# Patient Record
Sex: Female | Born: 1935 | Race: White | Hispanic: No | Marital: Married | Smoking: Never smoker
Health system: Southern US, Community
[De-identification: ages and names within clinical notes are randomized; demographics above are authoritative.]

## PROBLEM LIST (undated history)

## (undated) DIAGNOSIS — I10 Essential (primary) hypertension: Secondary | ICD-10-CM

## (undated) DIAGNOSIS — I701 Atherosclerosis of renal artery: Secondary | ICD-10-CM

## (undated) DIAGNOSIS — I714 Abdominal aortic aneurysm, without rupture, unspecified: Secondary | ICD-10-CM

## (undated) DIAGNOSIS — E785 Hyperlipidemia, unspecified: Secondary | ICD-10-CM

## (undated) DIAGNOSIS — I739 Peripheral vascular disease, unspecified: Secondary | ICD-10-CM

## (undated) DIAGNOSIS — I5022 Chronic systolic (congestive) heart failure: Secondary | ICD-10-CM

## (undated) DIAGNOSIS — I519 Heart disease, unspecified: Secondary | ICD-10-CM

## (undated) DIAGNOSIS — I2510 Atherosclerotic heart disease of native coronary artery without angina pectoris: Secondary | ICD-10-CM

## (undated) DIAGNOSIS — I4891 Unspecified atrial fibrillation: Secondary | ICD-10-CM

## (undated) HISTORY — DX: Atherosclerosis of renal artery: I70.1

## (undated) HISTORY — DX: Essential (primary) hypertension: I10

## (undated) HISTORY — DX: Abdominal aortic aneurysm, without rupture, unspecified: I71.40

## (undated) HISTORY — DX: Peripheral vascular disease, unspecified: I73.9

## (undated) HISTORY — DX: Atherosclerotic heart disease of native coronary artery without angina pectoris: I25.10

## (undated) HISTORY — PX: ANGIOPLASTY: SHX39

## (undated) HISTORY — PX: OTHER SURGICAL HISTORY: SHX169

## (undated) HISTORY — PX: CORONARY STENT PLACEMENT: SHX1402

## (undated) HISTORY — DX: Hyperlipidemia, unspecified: E78.5

## (undated) HISTORY — DX: Heart disease, unspecified: I51.9

## (undated) HISTORY — DX: Abdominal aortic aneurysm, without rupture: I71.4

---

## 1998-02-23 ENCOUNTER — Other Ambulatory Visit: Admission: RE | Admit: 1998-02-23 | Discharge: 1998-02-23 | Payer: Self-pay | Admitting: Family Medicine

## 2001-05-19 ENCOUNTER — Encounter: Payer: Self-pay | Admitting: Internal Medicine

## 2001-05-19 ENCOUNTER — Encounter: Admission: RE | Admit: 2001-05-19 | Discharge: 2001-05-19 | Payer: Self-pay | Admitting: Internal Medicine

## 2002-06-02 ENCOUNTER — Encounter: Payer: Self-pay | Admitting: Ophthalmology

## 2002-06-02 ENCOUNTER — Ambulatory Visit (HOSPITAL_COMMUNITY): Admission: RE | Admit: 2002-06-02 | Discharge: 2002-06-03 | Payer: Self-pay | Admitting: Ophthalmology

## 2005-10-28 DIAGNOSIS — I2510 Atherosclerotic heart disease of native coronary artery without angina pectoris: Secondary | ICD-10-CM

## 2005-10-28 HISTORY — DX: Atherosclerotic heart disease of native coronary artery without angina pectoris: I25.10

## 2006-08-13 ENCOUNTER — Encounter: Admission: RE | Admit: 2006-08-13 | Discharge: 2006-08-13 | Payer: Self-pay | Admitting: Internal Medicine

## 2006-10-09 ENCOUNTER — Inpatient Hospital Stay (HOSPITAL_COMMUNITY): Admission: RE | Admit: 2006-10-09 | Discharge: 2006-10-10 | Payer: Self-pay | Admitting: Interventional Cardiology

## 2006-10-23 ENCOUNTER — Ambulatory Visit (HOSPITAL_COMMUNITY): Admission: RE | Admit: 2006-10-23 | Discharge: 2006-10-23 | Payer: Self-pay | Admitting: Interventional Cardiology

## 2009-04-20 ENCOUNTER — Inpatient Hospital Stay (HOSPITAL_COMMUNITY): Admission: AD | Admit: 2009-04-20 | Discharge: 2009-04-22 | Payer: Self-pay | Admitting: Cardiology

## 2011-02-04 LAB — CBC
HCT: 39 % (ref 36.0–46.0)
HCT: 40.5 % (ref 36.0–46.0)
Hemoglobin: 13.3 g/dL (ref 12.0–15.0)
Hemoglobin: 14 g/dL (ref 12.0–15.0)
MCHC: 34.1 g/dL (ref 30.0–36.0)
MCHC: 34.6 g/dL (ref 30.0–36.0)
MCV: 87.2 fL (ref 78.0–100.0)
MCV: 87.5 fL (ref 78.0–100.0)
Platelets: 224 10*3/uL (ref 150–400)
Platelets: 265 10*3/uL (ref 150–400)
RBC: 4.46 MIL/uL (ref 3.87–5.11)
RBC: 4.65 MIL/uL (ref 3.87–5.11)
RDW: 13.1 % (ref 11.5–15.5)
RDW: 13.6 % (ref 11.5–15.5)
WBC: 5.6 10*3/uL (ref 4.0–10.5)
WBC: 7.2 10*3/uL (ref 4.0–10.5)

## 2011-02-04 LAB — COMPREHENSIVE METABOLIC PANEL
ALT: 27 U/L (ref 0–35)
AST: 60 U/L — ABNORMAL HIGH (ref 0–37)
Albumin: 4.1 g/dL (ref 3.5–5.2)
Alkaline Phosphatase: 101 U/L (ref 39–117)
BUN: 11 mg/dL (ref 6–23)
CO2: 30 mEq/L (ref 19–32)
Calcium: 9.4 mg/dL (ref 8.4–10.5)
Chloride: 101 mEq/L (ref 96–112)
Creatinine, Ser: 0.52 mg/dL (ref 0.4–1.2)
GFR calc Af Amer: >60 mL/min (ref >60)
GFR calc non Af Amer: >60 mL/min (ref >60)
Glucose, Bld: 115 mg/dL — ABNORMAL HIGH (ref 70–99)
Potassium: 3.6 mEq/L (ref 3.5–5.1)
Sodium: 141 mEq/L (ref 135–145)
Total Bilirubin: 0.9 mg/dL (ref 0.3–1.2)
Total Protein: 6.8 g/dL (ref 6.0–8.3)

## 2011-02-04 LAB — CARDIAC PANEL(CRET KIN+CKTOT+MB+TROPI)
CK, MB: 1 ng/mL (ref 0.3–4.0)
CK, MB: 1 ng/mL (ref 0.3–4.0)
CK, MB: 1.4 ng/mL (ref 0.3–4.0)
Relative Index: INVALID (ref 0.0–2.5)
Relative Index: INVALID (ref 0.0–2.5)
Relative Index: INVALID (ref 0.0–2.5)
Total CK: 31 U/L (ref 7–177)
Total CK: 39 U/L (ref 7–177)
Total CK: 42 U/L (ref 7–177)
Troponin I: 0.01 ng/mL (ref 0.00–0.06)
Troponin I: 0.02 ng/mL (ref 0.00–0.06)
Troponin I: 0.02 ng/mL (ref 0.00–0.06)

## 2011-02-04 LAB — BASIC METABOLIC PANEL
BUN: 9 mg/dL (ref 6–23)
CO2: 29 mEq/L (ref 19–32)
Calcium: 9.1 mg/dL (ref 8.4–10.5)
Chloride: 104 mEq/L (ref 96–112)
Creatinine, Ser: 0.56 mg/dL (ref 0.4–1.2)
GFR calc Af Amer: >60 mL/min (ref >60)
GFR calc non Af Amer: >60 mL/min (ref >60)
Glucose, Bld: 96 mg/dL (ref 70–99)
Potassium: 3.3 mEq/L — ABNORMAL LOW (ref 3.5–5.1)
Sodium: 140 mEq/L (ref 135–145)

## 2011-02-04 LAB — DIFFERENTIAL
Basophils Absolute: 0 10*3/uL (ref 0.0–0.1)
Basophils Relative: 0 % (ref 0–1)
Eosinophils Absolute: 0 10*3/uL (ref 0.0–0.7)
Eosinophils Relative: 0 % (ref 0–5)
Lymphocytes Relative: 12 % (ref 12–46)
Lymphs Abs: 0.9 10*3/uL (ref 0.7–4.0)
Monocytes Absolute: 0.4 10*3/uL (ref 0.1–1.0)
Monocytes Relative: 5 % (ref 3–12)
Neutro Abs: 5.9 10*3/uL (ref 1.7–7.7)
Neutrophils Relative %: 83 % — ABNORMAL HIGH (ref 43–77)

## 2011-02-04 LAB — LIPID PANEL
Cholesterol: 328 mg/dL — ABNORMAL HIGH (ref 0–200)
HDL: 47 mg/dL (ref >39)
LDL Cholesterol: 225 mg/dL — ABNORMAL HIGH (ref 0–99)
Total CHOL/HDL Ratio: 7 RATIO
Triglycerides: 281 mg/dL — ABNORMAL HIGH (ref <150)
VLDL: 56 mg/dL — ABNORMAL HIGH (ref 0–40)

## 2011-02-04 LAB — PROTIME-INR
INR: 1 (ref 0.00–1.49)
Prothrombin Time: 13.2 seconds (ref 11.6–15.2)

## 2011-02-04 LAB — BRAIN NATRIURETIC PEPTIDE: Pro B Natriuretic peptide (BNP): 55 pg/mL (ref 0.0–100.0)

## 2011-02-04 LAB — TSH: TSH: 0.32 u[IU]/mL — ABNORMAL LOW (ref 0.350–4.500)

## 2011-03-12 NOTE — Cardiovascular Report (Signed)
NAMECUMA, KOPITZKE NO.:  [id number]   MEDICAL RECORD NO.:  [id number]          PATIENT TYPE:  INP   LOCATION:  2506                         FACILITY:  [facility]   PHYSICIAN:  Jettie Booze, MDDATE OF BIRTH:  12/28/1925   DATE OF PROCEDURE:  04/21/2009  DATE OF DISCHARGE:  04/22/2009                            CARDIAC CATHETERIZATION   REFERRING PHYSICIAN:  Delanna Ahmadi, MD   PROCEDURES PERFORMED:  1. Left heart catheterization.  2. Left ventriculogram.  3. Coronary angiogram.  4. Intracoronary vascular ultrasound of the left anterior descending.  5. Percutaneous coronary intervention of the left circumflex.   OPERATOR:  Jettie Booze, MD   INDICATIONS:  Unstable angina.   PROCEDURE NARRATIVE:  The risks and benefits of cardiac catheterization  were explained to the patient, and informed consent was obtained.  The  patient was brought to the cath lab.  She was prepped and draped in the  usual sterile fashion.  Her right groin was infiltrated with 1%  lidocaine.  A 6-French sheath was placed into the right femoral artery  using the modified Seldinger technique.  Left coronary artery  angiography was performed using a JL4 catheter.  The catheter was  advanced to the vessel ostium under fluoroscopic guidance.  Digital  angiography was performed in multiple projections using hand injection  of contrast.  The right coronary artery angiography was performed using  a JR4.0 catheter.  The catheter was advanced to the vessel ostium under  fluoroscopic guidance.  Digital angiography was performed in a similar  fashion.  A pigtail catheter was advanced to the ascending aorta and  across the aortic valve under fluoroscopic guidance.  A power injection  of contrast was performed in the RAO projection to image the left  ventricle.  The catheter was pulled back under continuous hemodynamic  pressure monitoring.  The IVUS of the LAD was then performed using a  CLS  3.5 guiding catheter.  The catheter was advanced to the vessel ostium  under fluoroscopic guidance.  Prowater wire was placed down the LAD.  IVUS was performed.  Subsequently, the PCI of the circumflex was  performed.  See below for details.  The sheath was removed using manual  compression after the PCI was performed.   FINDINGS:  The left main coronary artery was widely patent.  The left  circumflex was a large vessel.  There is a 70% eccentric lesion noted in  the mid circumflex at the origin of the first obtuse marginal.  The  remainder of the circumflex was a large vessel with mild irregularities.  The OM1 was also a large vessel with mild luminal irregularities.  The  left anterior descending was a large vessel with moderate  atherosclerosis in the midportion and mild irregularities in the  remainder of the vessel.  The first and second diagonals were small,  vessels were widely patent, third diagonal was a medium-sized vessel and  widely patent.  The right coronary artery was a large dominant vessel  with mild-to-moderate atherosclerosis diffusely.  The posterolateral  artery was a large vessel.  The  PDA was large vessel with mild luminal  irregularities.  There was some ectasia noted in the distal right  coronary artery.   The left ventriculogram shows moderately decreased left ventricular  function with an estimated ejection fraction 30-35%.  The IVUS of the  LAD was then performed.  There was no portion of the LAD that had a  cross-sectional area less than 4 sq mm.  The stenosis was approximately  50% in cross-sectional area.  The Prowater wire was subsequently  advanced to the circumflex.  A 2.5 x 12 Endeavor stent was placed across  the lesion and deployed at 12 atmospheres for 25 seconds.  The stent was  then postdilated with a 3.0 x 9 balloon, inflated to 14 atmospheres for  25 seconds.  There was an approximately 10% residual stenosis.  TIMI 3  flow was maintained  throughout.  The patient tolerated the procedure  well.   IMPRESSION:  1. Moderate left anterior descending disease, which did not turn out      to be significant by intracoronary vascular ultrasound.  2. 70% mid circumflex lesion, which was successfully treated with a      drug-eluting stent, dilated to greater than 3 mm in diameter.  This      was a 3.0 x 12 mm Endeavor stent.  3. Moderately to severely reduced left ventricular function with an      estimated ejection fraction of 30-35%.  4. Normal hemodynamics with an left ventricular end diastolic pressure      of less than 10.  No aortic valve gradient.  Aortic pressure 134/65      with a mean aortic pressure of 92, left ventricular pressure of      135/0 with an left ventricular end diastolic pressure of 7.   RECOMMENDATIONS:  Continue aspirin and Plavix indefinitely.  The patient  had already been on Plavix because of her prior intervention.  Of note,  the site of the diagonal intervention looked patent.  Continue  aggressive secondary prevention.  We will also increase carvedilol and  will need to follow up on her left ventricular ejection fraction.      Jettie Booze, MD  Electronically Signed     Jettie Booze, MD  Electronically Signed    JSV/MEDQ  D:  05/23/2009  T:  05/24/2009  Job:  [id number]

## 2011-03-15 NOTE — Discharge Summary (Signed)
NAME:  Jacqueline Reilly, Jacqueline Reilly NO.:  [id number]   MEDICAL RECORD NO.:  [id number]          PATIENT TYPE:  INP   LOCATION:  6526                         FACILITY:  [facility]   PHYSICIAN:  Joesphine Bare, P.A.       DATE OF BIRTH:  12/28/1925   DATE OF ADMISSION:  10/09/2006  DATE OF DISCHARGE:                               DISCHARGE SUMMARY   DISCHARGE DIAGNOSES:  1. Coronary artery disease, status post cutting plane angioplasty of      the ostium of the second diagonal.  2. Peripheral vascular disease with discovery of left artery stenosis      for later intervention.  3. Severe left ventricular dysfunction, ischemic.  4. Hyperlipidemia, treated.  5. Hypertension, treated.  6. Family history of coronary artery disease.  7. Long-term medication use.   HISTORY:  Ms. Banger is a 77 year old female who in October began having  dyspnea on exertion.  A 2-D echo showed severe LV systolic dysfunction.  She was aggressively treated with Lasix and lisinopril.  Her symptoms  improved; however, she was felt to need to undergo cardiac  catheterization for definitive diagnosis/treatment.   She was admitted on October 09, 2006, and underwent left heart  catheterization under the care of Dr. Casandra Doffing.  She was found to  have a second diagonal with a 70% ostial stenosis.  Other vessels had  moderate irregularities but no significant disease.  She was also found  to have severe LV systolic dysfunction at around 20% to 25%.  An  abdominal aortogram showed a small infrarenal AAA.  There is an ostial  left renal artery stenosis of 90%.   The patient underwent cutting plane angioplasty to the second diagonal  reducing the 70% ostial lesion to a 0% post procedure.  The patient is  to remain on aspirin and Plavix.  She is to follow up in the office in 1  week, and at that time arrangements will be made for an intervention to  the left renal artery.   DISCHARGE MEDICATIONS:  1. Lipitor  daily.  2. Lasix daily.  3. Potassium 20 mEq a day.  4. Lisinopril 10 mg a day.  5. Enteric-coated aspirin 325 mg a day.  6. Plavix 75 mg a day.  7. Maxzide daily.  8. Tylenol p.r.n.  9. Fioricet p.r.n.  10.Coreg 3.125 mg b.i.d.  11.Sublingual nitroglycerin p.r.n. chest pain.   DISCHARGE INSTRUCTIONS:  She is to remain on a low-fat diet.  No driving  for 2 days.  No lifting over 10 pounds for 1 week.  Clean area gently  with soap and water; no scrubbing.  Call for any recurrent chest pain or  problems.  Follow up with Dr. Irish Lack on October 17, 2006, at 11:45  a.m.      Joesphine Bare, P.A.     LB/MEDQ  D:  10/10/2006  T:  10/10/2006  Job:  [id number]   cc:   Delanna Ahmadi, M.D.  Jettie Booze, MD

## 2011-03-15 NOTE — Cardiovascular Report (Signed)
NAME:  Jacqueline Reilly, Jacqueline Reilly NO.:  [id number]   MEDICAL RECORD NO.:  [id number]          PATIENT TYPE:  OIB   LOCATION:  2899                         FACILITY:  [facility]   PHYSICIAN:  Jettie Booze, MDDATE OF BIRTH:  12/28/1925   DATE OF PROCEDURE:  10/09/2006  DATE OF DISCHARGE:                            CARDIAC CATHETERIZATION   PROCEDURES PERFORMED:  1. Left heart catheterization.  2. Left ventriculogram.  3. Abdominal aortogram.  4. Coronary angiogram.  5. PCI of the second diagonal.   OPERATOR:  Dr. Irish Lack.   INDICATIONS:  Congestive heart failure and shortness of breath.   PROCEDURE AND NARRATIVE:  The risks and benefits of cardiac  catheterization were explained to the patient and informed consent was  obtained.  The patient was brought to the Catheterization Lab and placed  on the table.  She was prepped and draped in the usual sterile fashion.  Her right groin was infiltrated with 1% lidocaine.  A 6-French arterial  sheath was placed into a right femoral artery using the modified  Seldinger technique.  Left coronary artery angiography was performed  using a JL4.0 catheter.  The catheter was advanced to the vessel ostium  under fluoroscopic guidance.  Digital angiography was performed in  multiple projections using hand injection of contrast.  Right coronary  artery angiography was then performed using a JR4.0 catheter.  The  catheter was advanced to the vessel ostium under fluoroscopic guidance.  Digital angiography was performed using multiple projections and hand  injection of contrast.  The left heart catheterization was then  performed using a pigtail catheter.  The catheter was advanced across  the aortic valve under fluoroscopic guidance.  A power injection of  contrast was done in the third-degree RAO position and visualized the  left ventricle.  The catheter was pulled back under continuous  hemodynamic pressure monitoring.  The catheter  was then withdrawn to the  level of the abdominal aorta.  A power injection of contrast was done in  the AP projection to visualize the renal arteries.  The PCI of the  diagonal was then performed.  Please see below for details.  The sheath  will be removed using manual compression.  Angiomax was used for  anticoagulation for the intervention.   FINDINGS:  The left main is widely patent.  The left circumflex is a  large vessel with a 40-50% mid vessel stenosis just before the large OM-  2.  The OM-1 is a small vessel.  The OM-2 is a large vessel with mild  irregularities.  The LAD is a large vessel with moderate atherosclerosis  throughout.  There is a 50-60% distal stenosis.  The first diagonal is a  small vessel.  The second diagonal is a moderate-sized vessel with an  ostial 70% stenosis.  The right coronary artery is a large dominant  vessel with mild atherosclerosis.  Left ventriculogram showed severe  left ventricular systolic dysfunction.  The estimated ejection fraction  is 20-25%.  The abdominal aortogram revealed a small infrarenal  abdominal aortic aneurysm.  There is dual arterial supply to  the right  kidney.  There is an ostial left renal artery stenosis of 90%.   HEMODYNAMICS:  Left ventricular pressure 125/0 with an LVEDP of 3 mmHg.  Aortic pressure of 122/60 with a mean aortic pressure of 85 mmHg.  PCI  narrative:  ACLS 3.5 guiding catheter was advanced to the ostium of the  left main.  A Prowater wire was placed down the second diagonal.  A 2.0  x 6 mm cutting balloon was inflated to 6 atmospheres for 45 seconds.  The cutting balloon was subsequently inflated to 6 atmospheres for 30  seconds.  There was an excellent angiographic result.  There was TIMI-  III flow.  There was no residual stenosis.   IMPRESSION:  1. Severe left ventricular dysfunction with an estimated ejection      fraction of 20-25%.  2. Moderate coronary atherosclerosis in the left coronary system.   3. Successful cutting balloon angioplasty of the D2 ostium.  There is      no residual stenosis with TIMI-III flow.   RECOMMENDATIONS:  Continue aspirin and Plavix for now.  We will watch  the patient overnight.  I will plan on PTA/stent of the left renal  artery at a later time once she has recovered from this procedure.  Continue ACE inhibitor for treatment of her systolic dysfunction.  We  will hold diuretics now because her left ventricular end-diastolic  pressure was so low during the catheterization.  She will also need a  beta blocker at some point.      Jettie Booze, MD  Electronically Signed     JSV/MEDQ  D:  10/09/2006  T:  10/09/2006  Job:  [id number]

## 2011-03-15 NOTE — Op Note (Signed)
NAME:  Jacqueline, Reilly NO.:  [id number]   MEDICAL RECORD NO.:  [id number]          PATIENT TYPE:  AMB   LOCATION:  SDS                          FACILITY:  [facility]   PHYSICIAN:  Jettie Booze, MDDATE OF BIRTH:  12/28/1925   DATE OF PROCEDURE:  10/23/2006  DATE OF DISCHARGE:  10/23/2006                               OPERATIVE REPORT   PROCEDURES PERFORMED:  Abdominal aortogram, bilateral selective renal  artery angiogram, hemodynamic pressure assessment of the left ostia  renal artery stenosis.   OPERATOR:  Jettie Booze, M.D.   INDICATIONS:  Renal artery stenosis by prior angiogram, hypertension and  pulmonary edema in a patient who has newly diagnosed congestive heart  failure.   PROCEDURE:  The risks and benefits of peripheral angiography and  intervention were explained to the patient and informed consent was  obtained. The patient was brought to the [facility] lab. She was placed on the  table.  She was prepped and draped in the usual sterile fashion.  Her  left groin was infiltrated with 1% lidocaine.  A 6-French arterial  sheath was placed into the left femoral artery using modified Seldinger  technique. A pigtail catheter was advanced through the abdominal aorta  and a power injection of contrast was performed using digital  subtraction angiography. The renal arteries were visualized. A 5-French  JR-4.0 catheter was then advanced the abdominal aorta and used to  intubate the artery to the upper pole of the of the right kidney.  The  catheter was then rotated and used to engage the ostium of the left  renal artery. There was question about the hemodynamic significance of  the ostial lesion.  A stabilizer wire was introduced into the left renal  artery through the JR-4 catheter.  The catheter was exchanged for a 4-  French end-hole catheter. The end-hole catheter was advanced to the  stenotic area and a pullback was performed under continuous  hemodynamic  pressure monitoring. The endhole catherter could not not be advanced  fully across the stenosis.  Repeat selective angiography was then  performed to ensure there is no trauma to the vessel. The sheath will be  removed using manual pressure.   FINDINGS:  The abdominal aorta shows a dual arterial supply to the right  kidney.  There is single arterial supply to the left kidney.  There is a  long aneurysm involving the infrarenal aorta. The selective angiography  of the right renal artery shows a 20% ostial stenosis of the artery  supplying the upper pole. This is significantly larger artery than the  artery supplying the lower pole.  The selective angiogram of the left  renal artery shows an ostial 20% lesion. There was no gradient by  pullback.   IMPRESSION:  1. No significant renal artery stenosis.  Mild left renal artery      stenosis.  2. Small infrarenal abdominal aortic aneurysm.   RECOMMENDATIONS:  The patient should continue with aggressive preventive  therapy and blood pressure control. Continue her ACE inhibitor for  congestive heart failure. I will follow up with  the patient in the  office.      Jettie Booze, MD  Electronically Signed     JSV/MEDQ  D:  10/23/2006  T:  10/23/2006  Job:  [id number]

## 2011-03-15 NOTE — Discharge Summary (Signed)
NAMESHATORI, LIBERA NO.:  [id number]   MEDICAL RECORD NO.:  [id number]          PATIENT TYPE:  INP   LOCATION:  2506                         FACILITY:  [facility]   PHYSICIAN:  Jettie Booze, MDDATE OF BIRTH:  12/28/1925   DATE OF ADMISSION:  04/20/2009  DATE OF DISCHARGE:  04/22/2009                               DISCHARGE SUMMARY   DISCHARGE DIAGNOSES:  1. Coronary artery disease.  2. Nonischemic cardiomyopathy.  3. Unstable angina.   PROCEDURE PERFORMED:  Cardiac catheterization with drug-eluting stent  placement to the left circumflex.   HOSPITAL COURSE:  The patient was seen in our office and admitted  directly to the hospital.  She ruled out for MI by enzymes, but had some  ECG changes and chest discomfort.  She underwent cardiac catheterization  showing a moderate stenosis in the LAD and a 75% stenosis in the left  circumflex.  The left circumflex stenosis was successfully stented with  a drug-eluting stent.  Her symptoms resolved.  Of note, she did have  some short episodes of nonsustained ventricular tachycardia while she  was on telemetry.  Her Coreg dose was increased.  She remained  asymptomatic.   DISCHARGE MEDICATIONS:  1. Coreg 6.25 mg b.i.d.  2. Plavix 75 mg daily.  3. Lisinopril 10 mg a day.  4. Lasix 40 mg daily.  5. Aspirin 325 mg daily.  6. Sublingual nitroglycerin p.r.n.   DISCHARGE DIET:  Low-sodium, heart-healthy diet.   ACTIVITY:  Increase activity slowly.   FOLLOWUP APPOINTMENTS:  With Dr. Irish Lack on May 02, 2009, at 11:30 in  the morning.      Jettie Booze, MD  Electronically Signed     Jettie Booze, MD  Electronically Signed   JSV/MEDQ  D:  06/07/2009  T:  06/07/2009  Job:  [id number]

## 2011-03-15 NOTE — Op Note (Signed)
NAME:  Jacqueline Reilly, Jacqueline Reilly NO.:  [id number]   MEDICAL RECORD NO.:  [id number]                   PATIENT TYPE:  OIB   LOCATION:  2550                                 FACILITY:  [facility]   PHYSICIAN:  Clent Demark. Rankin, M.D.                DATE OF BIRTH:  12/28/1925   DATE OF PROCEDURE:  06/02/2002  DATE OF DISCHARGE:                                 OPERATIVE REPORT   PREOPERATIVE DIAGNOSIS:  Rhegmatogenous detachment of the right eye--macula  on.   POSTOPERATIVE DIAGNOSIS:  Rhegmatogenous detachment of the right eye--macula  on.   PROCEDURE:  Scleral buckle retinal cryopexy of the right eye to repair  retinal detachment.   SURGEON:  Clent Demark. Rankin, M.D.   ANESTHESIA:  General endotracheal anesthesia.   INDICATIONS FOR PROCEDURE:  The patient is a 77 year old woman with profound  visual loss, nasal vision, as well as superior vision in the right eye on  the basis of spontaneous rhegmatogenous detachment.  This was an attempt to  reattach the retina via scleral buckle of the right eye.  The patient  understands the risks of anesthesia including recurrence of retinal  detachment, loss of vision, and progressive disease despite intervention.  After appropriate signed consent was obtained, the patient was taken to the  operating room.  She understood the risks of anesthesia including recurrent  retinal detachment of the eye.   PROCEDURE IN DETAIL:  She was taken to the operating room where general  endotracheal anesthesia as introduced without difficulty. The right  periocular region was sterilely prepped and draped in the usual ophthalmic  fashion.  A 360 degree relaxing incision was made superonasally and  inferotemporally.  The rectus muscle was isolated on 2-0 silk ties.  Retinal  cryopexy was applied, and a small retinal break was found at the 1 o'clock  position.  The retinal detachment was found to extend from 11 o'clock  nasally through the 3 o'clock  and over to the 7 o'clock position inferiorly.  Suspicious areas along the ora serrata in the nasal quadrants were treated  with cryopexy.  At this time, a 2 inch x 7 cm sickle knife was selected and  placed under the superior, medial, and anterior rectus muscles and a 240  band used to encircle the globe with 70 Watzke sleeve in the superotemporal  quadrant.  The buckle was tied temporarily with 5-0 [place]il[place] mattress  sutures, two in each of the nasal quadrants and one in each in the remaining  quadrants.  External drainage of subretinal fluid was carried out in the  closed technique using a 26-gauge needle on a 3 cc syringe under direct  observation within the bed of the buckle inferonasally.  All fluid was  removed in this fashion.  Constant pressure on the eye was maintained using  the rectus sutures.  At this time, the buckle was then  tightened permanently  in each of the quadrants.  Ophthalmoscopy confirmed there was no subretinal  fluid.  Excellent seal on the patient was applied.  The retina was attached.  The ends of the band were trimmed in the superotemporal quadrant.  Appropriate tension was applied.  Intraocular pressure was assessed and  found to be adequate, less than 25, and the optic nerve was perfused.  At  this time, the eye was irrigated with bug juice and conjunctiva and tenon's  were brought forward and closed in layers with 7-0 Vicryl suture.  Subconjunctival injections with antibiotics were applied.  A silk patch and  Fox shield applied.  The patient tolerated the procedure well without  complications and taken to the recovery room in good, stable condition.                                              Clent Demark Rankin, M.D.   GAR/MEDQ  D:  06/02/2002  T:  06/06/2002  Job:  [id number]

## 2011-12-14 DIAGNOSIS — R040 Epistaxis: Secondary | ICD-10-CM | POA: Diagnosis not present

## 2012-02-07 DIAGNOSIS — IMO0002 Reserved for concepts with insufficient information to code with codable children: Secondary | ICD-10-CM | POA: Diagnosis not present

## 2012-02-25 ENCOUNTER — Encounter: Payer: Self-pay | Admitting: Internal Medicine

## 2012-04-08 ENCOUNTER — Encounter: Payer: Self-pay | Admitting: *Deleted

## 2012-05-01 ENCOUNTER — Ambulatory Visit (INDEPENDENT_AMBULATORY_CARE_PROVIDER_SITE_OTHER): Payer: Medicare Other | Admitting: Internal Medicine

## 2012-05-01 ENCOUNTER — Encounter: Payer: Self-pay | Admitting: Internal Medicine

## 2012-05-01 VITALS — BP 130/84 | HR 72 | Ht 60.0 in | Wt 113.2 lb

## 2012-05-01 DIAGNOSIS — K5900 Constipation, unspecified: Secondary | ICD-10-CM | POA: Diagnosis not present

## 2012-05-01 NOTE — Progress Notes (Signed)
Jacqueline Reilly [birth year] MRN [id number]    History of Present Illness:  This is an 78 year old white female with constipation. She is embarrassed to talk about it. Her husband doesn't know that she is here. She has had difficulty in evacuating stool. She denies rectal bleeding or abdominal pain. She has never had a colonoscopy. There is no family history of colon cancer. She has taken over-the-counter Senokot 1 tablet with good results. She is very active physically. Her weight has been stable. She has been on a high fiber diet, whole wheat toast in the morning, salads at noontime and vegetables at night.   Past Medical History  Diagnosis Date  . Coronary artery disease   . PVD (peripheral vascular disease)   . Ventricular dysfunction     left; ischemic  . Hypertension   . Hyperlipidemia   . AAA (abdominal aortic aneurysm)   . Renal artery stenosis    Past Surgical History  Procedure Date  . Retinal cryopexy     right eye (for retinal detachment)  . Angioplasty     reports that she has never smoked. She has never used smokeless tobacco. She reports that she does not drink alcohol or use illicit drugs. family history includes Heart disease in her brother, father, and mother. Not on File      Review of Systems: Negative for dysphagia, odynophagia, shortness of breath or chest pain  The remainder of the 10 point ROS is negative except as outlined in H&P   Physical Exam: General appearance  Well developed, in no distress. Eyes- non icteric. HEENT nontraumatic, normocephalic. Mouth no lesions, tongue papillated, no cheilosis. Neck supple without adenopathy, thyroid not enlarged, no carotid bruits, no JVD. Lungs Clear to auscultation bilaterally. Cor normal S1, normal S2, regular rhythm, no murmur,  quiet precordium. Abdomen: Soft relaxed abdomen with normal active bowel sounds. No bruit. No palpable mass. No tenderness. Liver edge at costal margin. Rectal: Normal perianal  area. Normal rectal sphincter tone. Small amount of soft Hemoccult negative stool in the rectal ampulla. There is no prolapse. Extremities no pedal edema. Skin no lesions. Neurological alert and oriented x 3. Psychological normal mood and affect.  Assessment and Plan:  Problem #1 Functional constipation in an elderly woman who is 78 years old and has never had a colonoscopy. At her age, she is not a  candidate for a screening colonoscopy. She is Hemoccult-negative. We have discussed a high-fiber diet as well as over-the-counter fiber supplements which she apparently took in the past and didn't like. I have given her a high fiber diet booklet and pamplet on constipation. She will try to remain physically active and she may take Senokot 2 tablets 3 times a week on a regular basis. As an alternative, I have suggested milk of magnesia, magnesium oxide or Colace. She will check with [place] in 6 months.   05/01/2012 Delfin Edis

## 2012-05-01 NOTE — Patient Instructions (Addendum)
Constipation in Adults Constipation is having fewer than 2 bowel movements per week. Usually, the stools are hard. As we grow older, constipation is more common. If you try to fix constipation with laxatives, the problem may get worse. This is because laxatives taken over a long period of time make the colon muscles weaker. A low-fiber diet, not taking in enough fluids, and taking some medicines may make these problems worse. MEDICATIONS THAT MAY CAUSE CONSTIPATION  Water pills (diuretics).   Calcium channel blockers (used to control blood pressure and for the heart).   Certain pain medicines (narcotics).   Anticholinergics.   Anti-inflammatory agents.   Antacids that contain aluminum.  DISEASES THAT CONTRIBUTE TO CONSTIPATION  Diabetes.   Parkinson's disease.   Dementia.   Stroke.   Depression.   Illnesses that cause problems with salt and water metabolism.  HOME CARE INSTRUCTIONS   Constipation is usually best cared for without medicines. Increasing dietary fiber and eating more fruits and vegetables is the best way to manage constipation.   Slowly increase fiber intake to 25 to 38 grams per day. Whole grains, fruits, vegetables, and legumes are good sources of fiber. A dietitian can further help you incorporate high-fiber foods into your diet.   Drink enough water and fluids to keep your urine clear or pale yellow.   A fiber supplement may be added to your diet if you cannot get enough fiber from foods.   Increasing your activities also helps improve regularity.   Suppositories, as suggested by your caregiver, will also help. If you are using antacids, such as aluminum or calcium containing products, it will be helpful to switch to products containing magnesium if your caregiver says it is okay.   If you have been given a liquid injection (enema) today, this is only a temporary measure. It should not be relied on for treatment of longstanding (chronic) constipation.    Stronger measures, such as magnesium sulfate, should be avoided if possible. This may cause uncontrollable diarrhea. Using magnesium sulfate may not allow you time to make it to the bathroom.  SEEK IMMEDIATE MEDICAL CARE IF:   There is bright red blood in the stool.   The constipation stays for more than 4 days.   There is belly (abdominal) or rectal pain.   You do not seem to be getting better.   You have any questions or concerns.  MAKE SURE YOU:   Understand these instructions.   Will watch your condition.   Will get help right away if you are not doing well or get worse.  Document Released: 07/12/2004 Document Revised: 10/03/2011 Document Reviewed: 09/17/2011 [employer] Patient Information 2012 [employer], [place].  --------------------------------------------------------------------------------  High Fiber Diet A high fiber diet changes your normal diet to include more whole grains, legumes, fruits, and vegetables. Changes in the diet involve replacing refined carbohydrates with unrefined foods. The calorie level of the diet is essentially unchanged. The Dietary Reference Intake (recommended amount) for adult males is 38 g per day. For adult females, it is 25 g per day. Pregnant and lactating women should consume 28 g of fiber per day. Fiber is the intact part of a plant that is not broken down during digestion. Functional fiber is fiber that has been isolated from the plant to provide a beneficial effect in the body. PURPOSE  Increase stool bulk.   Ease and regulate bowel movements.   Lower cholesterol.  INDICATIONS THAT YOU NEED MORE FIBER  Constipation and hemorrhoids.   Uncomplicated  diverticulosis (intestine condition) and irritable bowel syndrome.   Weight management.   As a protective measure against hardening of the arteries (atherosclerosis), diabetes, and cancer.  NOTE OF CAUTION If you have a digestive or bowel problem, ask your caregiver for advice before  adding high fiber foods to your diet. Some of the following medical problems are such that a high fiber diet should not be used without consulting your caregiver:  Acute diverticulitis (intestine infection).   Partial small bowel obstructions.   Complicated diverticular disease involving bleeding, rupture (perforation), or abscess (boil, furuncle).   Presence of autonomic neuropathy (nerve damage) or gastric paresis (stomach cannot empty itself).  GUIDELINES FOR INCREASING FIBER  Start adding fiber to the diet slowly. A gradual increase of about 5 more grams (2 slices of whole-wheat bread, 2 servings of most fruits or vegetables, or 1 bowl of high fiber cereal) per day is best. Too rapid an increase in fiber may result in constipation, flatulence, and bloating.   Drink enough water and fluids to keep your urine clear or pale yellow. Water, juice, or caffeine-free drinks are recommended. Not drinking enough fluid may cause constipation.   Eat a variety of high fiber foods rather than one type of fiber.   Try to increase your intake of fiber through using high fiber foods rather than fiber pills or supplements that contain small amounts of fiber.   The goal is to change the types of food eaten. Do not supplement your present diet with high fiber foods, but replace foods in your present diet.  INCLUDE A VARIETY OF FIBER SOURCES  Replace refined and processed grains with whole grains, canned fruits with fresh fruits, and incorporate other fiber sources. White rice, white breads, and most bakery goods contain little or no fiber.   Brown whole-grain rice, buckwheat oats, and many fruits and vegetables are all good sources of fiber. These include: broccoli, Brussels sprouts, cabbage, cauliflower, beets, sweet potatoes, white potatoes (skin on), carrots, tomatoes, eggplant, squash, berries, fresh fruits, and dried fruits.   Cereals appear to be the richest source of fiber. Cereal fiber is found in  whole grains and bran. Bran is the fiber-rich outer coat of cereal grain, which is largely removed in refining. In whole-grain cereals, the bran remains. In breakfast cereals, the largest amount of fiber is found in those with "bran" in their names. The fiber content is sometimes indicated on the label.   You may need to include additional fruits and vegetables each day.   In baking, for 1 cup white flour, you may use the following substitutions:   1 cup whole-wheat flour minus 2 tbs.    cup white flour plus  cup whole-wheat flour.  Document Released: 10/14/2005 Document Revised: 10/03/2011 Document Reviewed: 08/22/2009 [facility] Patient Information 2012 [place], [place].  Dr Moshe Salisbury griffin

## 2012-05-02 ENCOUNTER — Encounter: Payer: Self-pay | Admitting: Internal Medicine

## 2012-07-22 ENCOUNTER — Emergency Department (HOSPITAL_COMMUNITY): Payer: Medicare Other

## 2012-07-22 ENCOUNTER — Inpatient Hospital Stay (HOSPITAL_COMMUNITY)
Admission: EM | Admit: 2012-07-22 | Discharge: 2012-07-24 | DRG: 087 | Disposition: A | Discharge status: Home / Self Care | Payer: Medicare Other | Attending: Neurosurgery | Admitting: Neurosurgery

## 2012-07-22 ENCOUNTER — Encounter (HOSPITAL_COMMUNITY): Payer: Self-pay | Admitting: Adult Health

## 2012-07-22 ENCOUNTER — Inpatient Hospital Stay (HOSPITAL_COMMUNITY): Payer: Medicare Other

## 2012-07-22 DIAGNOSIS — I701 Atherosclerosis of renal artery: Secondary | ICD-10-CM | POA: Diagnosis not present

## 2012-07-22 DIAGNOSIS — I739 Peripheral vascular disease, unspecified: Secondary | ICD-10-CM | POA: Diagnosis present

## 2012-07-22 DIAGNOSIS — I629 Nontraumatic intracranial hemorrhage, unspecified: Secondary | ICD-10-CM | POA: Diagnosis not present

## 2012-07-22 DIAGNOSIS — I671 Cerebral aneurysm, nonruptured: Secondary | ICD-10-CM | POA: Diagnosis not present

## 2012-07-22 DIAGNOSIS — M542 Cervicalgia: Secondary | ICD-10-CM | POA: Diagnosis not present

## 2012-07-22 DIAGNOSIS — I2510 Atherosclerotic heart disease of native coronary artery without angina pectoris: Secondary | ICD-10-CM | POA: Diagnosis not present

## 2012-07-22 DIAGNOSIS — S1093XA Contusion of unspecified part of neck, initial encounter: Secondary | ICD-10-CM | POA: Diagnosis not present

## 2012-07-22 DIAGNOSIS — S066XAA Traumatic subarachnoid hemorrhage with loss of consciousness status unknown, initial encounter: Secondary | ICD-10-CM | POA: Diagnosis not present

## 2012-07-22 DIAGNOSIS — S066X0A Traumatic subarachnoid hemorrhage without loss of consciousness, initial encounter: Principal | ICD-10-CM | POA: Diagnosis present

## 2012-07-22 DIAGNOSIS — E785 Hyperlipidemia, unspecified: Secondary | ICD-10-CM | POA: Diagnosis present

## 2012-07-22 DIAGNOSIS — R51 Headache: Secondary | ICD-10-CM | POA: Diagnosis not present

## 2012-07-22 DIAGNOSIS — I714 Abdominal aortic aneurysm, without rupture, unspecified: Secondary | ICD-10-CM | POA: Diagnosis present

## 2012-07-22 DIAGNOSIS — Z9861 Coronary angioplasty status: Secondary | ICD-10-CM

## 2012-07-22 DIAGNOSIS — S0990XA Unspecified injury of head, initial encounter: Secondary | ICD-10-CM | POA: Diagnosis not present

## 2012-07-22 DIAGNOSIS — S066X9A Traumatic subarachnoid hemorrhage with loss of consciousness of unspecified duration, initial encounter: Secondary | ICD-10-CM | POA: Diagnosis not present

## 2012-07-22 DIAGNOSIS — S0003XA Contusion of scalp, initial encounter: Secondary | ICD-10-CM | POA: Diagnosis not present

## 2012-07-22 DIAGNOSIS — Z951 Presence of aortocoronary bypass graft: Secondary | ICD-10-CM | POA: Diagnosis not present

## 2012-07-22 DIAGNOSIS — Y92009 Unspecified place in unspecified non-institutional (private) residence as the place of occurrence of the external cause: Secondary | ICD-10-CM

## 2012-07-22 DIAGNOSIS — M25519 Pain in unspecified shoulder: Secondary | ICD-10-CM | POA: Diagnosis not present

## 2012-07-22 DIAGNOSIS — W108XXA Fall (on) (from) other stairs and steps, initial encounter: Secondary | ICD-10-CM | POA: Diagnosis present

## 2012-07-22 DIAGNOSIS — I10 Essential (primary) hypertension: Secondary | ICD-10-CM | POA: Diagnosis not present

## 2012-07-22 DIAGNOSIS — R110 Nausea: Secondary | ICD-10-CM | POA: Diagnosis not present

## 2012-07-22 DIAGNOSIS — I609 Nontraumatic subarachnoid hemorrhage, unspecified: Secondary | ICD-10-CM

## 2012-07-22 LAB — MRSA PCR SCREENING: MRSA by PCR: NEGATIVE

## 2012-07-22 LAB — BASIC METABOLIC PANEL
BUN: 10 mg/dL (ref 6–23)
CO2: 25 mEq/L (ref 19–32)
Calcium: 10.2 mg/dL (ref 8.4–10.5)
Chloride: 97 mEq/L (ref 96–112)
Creatinine, Ser: 0.48 mg/dL — ABNORMAL LOW (ref 0.50–1.10)
GFR calc Af Amer: >90 mL/min (ref >90)
GFR calc non Af Amer: 86 mL/min — ABNORMAL LOW (ref >90)
Glucose, Bld: 140 mg/dL — ABNORMAL HIGH (ref 70–99)
Potassium: 4.8 mEq/L (ref 3.5–5.1)
Sodium: 137 mEq/L (ref 135–145)

## 2012-07-22 LAB — CBC WITH DIFFERENTIAL/PLATELET
Basophils Absolute: 0 10*3/uL (ref 0.0–0.1)
Basophils Relative: 0 % (ref 0–1)
Eosinophils Absolute: 0 10*3/uL (ref 0.0–0.7)
Eosinophils Relative: 0 % (ref 0–5)
HCT: 45 % (ref 36.0–46.0)
Hemoglobin: 15.5 g/dL — ABNORMAL HIGH (ref 12.0–15.0)
Lymphocytes Relative: 7 % — ABNORMAL LOW (ref 12–46)
Lymphs Abs: 1 10*3/uL (ref 0.7–4.0)
MCH: 30.4 pg (ref 26.0–34.0)
MCHC: 34.4 g/dL (ref 30.0–36.0)
MCV: 88.2 fL (ref 78.0–100.0)
Monocytes Absolute: 0.5 10*3/uL (ref 0.1–1.0)
Monocytes Relative: 4 % (ref 3–12)
Neutro Abs: 13.3 10*3/uL — ABNORMAL HIGH (ref 1.7–7.7)
Neutrophils Relative %: 90 % — ABNORMAL HIGH (ref 43–77)
Platelets: 249 10*3/uL (ref 150–400)
RBC: 5.1 MIL/uL (ref 3.87–5.11)
RDW: 12.7 % (ref 11.5–15.5)
WBC: 14.8 10*3/uL — ABNORMAL HIGH (ref 4.0–10.5)

## 2012-07-22 LAB — PROTIME-INR
INR: 0.9 (ref 0.00–1.49)
Prothrombin Time: 12.1 seconds (ref 11.6–15.2)

## 2012-07-22 LAB — APTT: aPTT: 27 seconds (ref 24–37)

## 2012-07-22 MED ORDER — SODIUM CHLORIDE 0.9 % IV SOLN: 250.0000 mL | INTRAVENOUS | Status: DC | PRN

## 2012-07-22 MED ORDER — HYDROCODONE-ACETAMINOPHEN 5-325 MG PO TABS
1.0000 | ORAL_TABLET | ORAL | Status: DC | PRN
  Administered 2012-07-23 – 2012-07-24 (×2): 1 via ORAL
  Filled 2012-07-22 (×2): qty 1

## 2012-07-22 MED ORDER — FAMOTIDINE IN NACL 20-0.9 MG/50ML-% IV SOLN
20.0000 mg | Freq: Two times a day (BID) | INTRAVENOUS | Status: DC
  Administered 2012-07-22 – 2012-07-24 (×4): 20 mg via INTRAVENOUS
  Filled 2012-07-22 (×5): qty 50

## 2012-07-22 MED ORDER — IOHEXOL 350 MG/ML SOLN
75.0000 mL | Freq: Once | INTRAVENOUS | Status: AC | PRN
  Administered 2012-07-22: 75 mL via INTRAVENOUS

## 2012-07-22 MED ORDER — FUROSEMIDE 20 MG PO TABS
40.0000 mg | ORAL_TABLET | Freq: Every day | ORAL | Status: DC
  Administered 2012-07-23 – 2012-07-24 (×2): 40 mg via ORAL
  Filled 2012-07-22 (×2): qty 2

## 2012-07-22 MED ORDER — ONDANSETRON HCL 4 MG/2ML IJ SOLN
4.0000 mg | Freq: Once | INTRAMUSCULAR | Status: AC
  Administered 2012-07-22: 4 mg via INTRAVENOUS
  Filled 2012-07-22: qty 2

## 2012-07-22 MED ORDER — LACTATED RINGERS IV SOLN
INTRAVENOUS | Status: DC
  Administered 2012-07-22 – 2012-07-24 (×2): via INTRAVENOUS

## 2012-07-22 MED ORDER — CARVEDILOL 3.125 MG PO TABS
3.1250 mg | ORAL_TABLET | Freq: Two times a day (BID) | ORAL | Status: DC
  Administered 2012-07-23 – 2012-07-24 (×2): 3.125 mg via ORAL
  Filled 2012-07-22 (×5): qty 1

## 2012-07-22 MED ORDER — MORPHINE SULFATE 2 MG/ML IJ SOLN
2.0000 mg | INTRAMUSCULAR | Status: DC | PRN
  Administered 2012-07-23 (×5): 2 mg via INTRAVENOUS
  Filled 2012-07-22 (×5): qty 1

## 2012-07-22 MED ORDER — ONDANSETRON HCL 4 MG/2ML IJ SOLN
4.0000 mg | Freq: Four times a day (QID) | INTRAMUSCULAR | Status: DC | PRN
  Administered 2012-07-22 – 2012-07-23 (×3): 4 mg via INTRAVENOUS
  Filled 2012-07-22 (×3): qty 2

## 2012-07-22 MED ORDER — LISINOPRIL 10 MG PO TABS
10.0000 mg | ORAL_TABLET | Freq: Every day | ORAL | Status: DC
  Administered 2012-07-23 – 2012-07-24 (×2): 10 mg via ORAL
  Filled 2012-07-22 (×2): qty 1

## 2012-07-22 NOTE — ED Notes (Signed)
Presents with fall from steps this afternoon at noon where she tripped and landed on left knee, and hit top of left side of head, hematoma noted. abraision to left knee and bruising noted to left shoulder. Able to move all extremities. Complains of headache and nausea. Per family pt had lost clarity of thought after fall. At this time she is answering all questions appropriately.

## 2012-07-22 NOTE — Progress Notes (Signed)
MD notified of 3 mm left ICA aneurysm results from CT scan.   Anda Kraft Clover

## 2012-07-22 NOTE — ED Provider Notes (Signed)
History     CSN: [id number]  Arrival date & time 07/22/12  1726   First MD Initiated Contact with Patient 07/22/12 1857      Chief Complaint  Patient presents with  . Fall    (Consider location/radiation/quality/duration/timing/severity/associated sxs/prior treatment) Patient is a 78 y.o. female presenting with fall. The history is provided by the patient.  Fall The accident occurred 1 to 2 hours ago. The fall occurred while walking (lost her balance and fell hitting her left knee and head). She fell from a height of 3 to 5 ft. She landed on concrete. There was no blood loss. The point of impact was the head and left knee. The pain is present in the head and left knee. The pain is at a severity of 5/10. The pain is moderate. She was ambulatory at the scene. There was no entrapment after the fall. Associated symptoms include nausea. Pertinent negatives include no visual change, no bowel incontinence, no vomiting, no headaches, no loss of consciousness and no tingling. Exacerbated by: nothing. She has tried nothing for the symptoms. The treatment provided no relief.    Past Medical History  Diagnosis Date  . Coronary artery disease   . PVD (peripheral vascular disease)   . Ventricular dysfunction     left; ischemic  . Hypertension   . Hyperlipidemia   . AAA (abdominal aortic aneurysm)   . Renal artery stenosis     Past Surgical History  Procedure Date  . Retinal cryopexy     right eye (for retinal detachment)  . Angioplasty     Family History  Problem Relation Age of Onset  . Heart disease Mother   . Heart disease Father   . Heart disease Brother     x 3    History  Substance Use Topics  . Smoking status: Never Smoker   . Smokeless tobacco: Never Used  . Alcohol Use: No    OB History    Grav Para Term Preterm Abortions TAB SAB Ect Mult Living                  Review of Systems  Gastrointestinal: Positive for nausea. Negative for vomiting and bowel  incontinence.  Neurological: Negative for tingling, loss of consciousness and headaches.  All other systems reviewed and are negative.    Allergies  Garlic  Home Medications   Current Outpatient Rx  Name Route Sig Dispense Refill  . ASPIRIN 81 MG PO CHEW Oral Chew 81 mg by mouth daily.    Marland Kitchen CARVEDILOL 3.125 MG PO TABS Oral Take 3.125 mg by mouth 2 (two) times daily with a meal.    . CLOPIDOGREL BISULFATE 75 MG PO TABS Oral Take 75 mg by mouth daily.    . FUROSEMIDE 40 MG PO TABS Oral Take 40 mg by mouth daily.    Marland Kitchen LISINOPRIL 10 MG PO TABS Oral Take 10 mg by mouth daily.      BP 147/84  Pulse 78  Temp 97.8 F (36.6 C) (Oral)  Resp 18  SpO2 100%  Physical Exam  Nursing note and vitals reviewed. Constitutional: She is oriented to person, place, and time. She appears well-developed and well-nourished. No distress.  HENT:  Head: Normocephalic. Head is with contusion.  Mouth/Throat: Oropharynx is clear and moist.  Eyes: Conjunctivae normal and EOM are normal. Pupils are equal, round, and reactive to light.  Neck: Normal range of motion. Neck supple. Spinous process tenderness and muscular tenderness present.  Cardiovascular:  Normal rate, regular rhythm and intact distal pulses.   No murmur heard. Pulmonary/Chest: Effort normal and breath sounds normal. No respiratory distress. She has no wheezes. She has no rales.  Abdominal: Soft. She exhibits no distension. There is no tenderness. There is no rebound and no guarding.  Musculoskeletal: Normal range of motion. She exhibits no edema and no tenderness.       Left knee: She exhibits normal range of motion, no swelling and no laceration. no tenderness found.       Legs: Neurological: She is alert and oriented to person, place, and time. She has normal strength. No cranial nerve deficit or sensory deficit. GCS eye subscore is 4. GCS verbal subscore is 5. GCS motor subscore is 6.  Skin: Skin is warm and dry. No rash noted. No  erythema.  Psychiatric: She has a normal mood and affect. Her behavior is normal.    ED Course  Procedures (including critical care time)  Labs Reviewed  CBC WITH DIFFERENTIAL - Abnormal; Notable for the following:    WBC 14.8 (*)     Hemoglobin 15.5 (*)     Neutrophils Relative 90 (*)     Neutro Abs 13.3 (*)     Lymphocytes Relative 7 (*)     All other components within normal limits  BASIC METABOLIC PANEL - Abnormal; Notable for the following:    Glucose, Bld 140 (*)     Creatinine, Ser 0.48 (*)     GFR calc non Af Amer 86 (*)     All other components within normal limits  PROTIME-INR  APTT   Ct Head Wo Contrast  07/22/2012  *RADIOLOGY REPORT*  Clinical Data: 78 year old female status post fall.  CT HEAD WITHOUT CONTRAST  Technique:  Contiguous axial images were obtained from the base of the skull through the vertex without contrast.  Comparison: None.  Findings: Broad-based left lateral scalp superficial hematoma measures up to 6 mm in thickness.  Underlying calvarium intact. Other scalp soft tissues are within normal limits.  Postoperative changes to the orbits.  Trace low density fluid level in the left sphenoid sinus.  Other Visualized paranasal sinuses and mastoids are clear.  No skull base fracture identified.  Small volume scattered subarachnoid hemorrhage in the basilar cisterns, interpeduncular cistern, slightly greater on the left.  No mass effect.  No intraventricular hemorrhage.  No ventriculomegaly.  No definite parenchymal contusion identified. No evidence of cortically based acute infarction identified.  IMPRESSION: 1.  Small volume subarachnoid hemorrhage in the basilar cisterns, nonspecific but may be related to trauma rather than aneurysm rupture. 2.  No other acute traumatic injury to the brain identified. 3.  Left scalp hematoma without underlying fracture.  Critical Value/emergent results were called by telephone at the time of interpretation on 07/22/2012 at 1900 hours  to Dr. Maryan Rued in the ED, who verbally acknowledged these results.   Original Report Authenticated By: Randall An, M.D.    Dg Shoulder Left  07/22/2012  *RADIOLOGY REPORT*  Clinical Data: Fall, left shoulder pain  LEFT SHOULDER - 2+ VIEW  Comparison: None.  Findings: No fracture or dislocation is seen.  Moderate to severe degenerative changes of the acromioclavicular joint.  Associated loose bodies, likely within the subacromial subdeltoid bursa.  Visualized left lung is clear.  IMPRESSION: No fracture or dislocation is seen.  Moderate to severe degenerative changes.  Associated loose bodies, likely within the subacromial subdeltoid bursa.   Original Report Authenticated By: Julian Hy, M.D.  1. SAH (subarachnoid hemorrhage)       MDM   Patient with a mechanical fall today from ground level stating she hit her head and scraped her left knee. After the fall she was able to ambulate without difficulty and wrote herself home. Patient is complaining of nausea here but denies severe headache before or after her fall. Patient's blood pressure is elevated at 152/109. She does take Plavix and a CT of her head was ordered while she was in the waiting room that showed subarachnoid hemorrhage. Left shoulder film was also done which showed no acute changes. Currently on exam patient has a GCS of 15 and has no focal neurologic deficits. Dr. Arnoldo Morale with neurosurgery was consult. Patient also had some tenderness in her C-spine and CT of her C-spine was also ordered.        Blanchie Dessert, MD 07/22/12 2011

## 2012-07-22 NOTE — ED Notes (Signed)
Chem 8 results  Na  137 K  5.2 Cl  104 iCa  1.07 TC02  26 Glu  139 BUN  13 Crea  0.7 Hct  48% Hb  16.3 AnGap  13

## 2012-07-22 NOTE — Consult Note (Signed)
Reason for Consult: Closed head injury, subarachnoid hemorrhage Referring Physician: Dr. Jaclyn Prime is an 78 y.o. female.  HPI: The patient is an 78 year old white female who was in usual state of good health this afternoon. 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 There was no loss of consciousness seizures paralysis neck pain back pain etc. The patient subsequently drove home. The patient was then brought to [facility] emergency department where she was violated by the emergency room staff. Evaluation included a head CT scan which demonstrated a small amount of subarachnoid blood in the basal cisterns. A neurosurgical consultation was requested.  Presently the patient is accompanied by her husband and her brother. She relates the above information. She complains of a headache. She denies neck pain back pain seizures numbness tingling weakness etc. She did have one episode of vomiting. The patient states that she was feeling fine prior to this fall. She did not have any headaches prior to this fall. She denies syncope. The patient is on Plavix and aspirin. Her primary doctor is Dr. Jola Baptist.  Past Medical History  Diagnosis Date  . Coronary artery disease   . PVD (peripheral vascular disease)   . Ventricular dysfunction     left; ischemic  . Hypertension   . Hyperlipidemia   . AAA (abdominal aortic aneurysm)   . Renal artery stenosis     Past Surgical History  Procedure Date  . Retinal cryopexy     right eye (for retinal detachment)  . Angioplasty     Family History  Problem Relation Age of Onset  . Heart disease Mother   . Heart disease Father   . Heart disease Brother     x 3    Social History:  reports that she has never smoked. She has never used smokeless tobacco. She reports that she does not drink alcohol or use illicit drugs.  Allergies:  Allergies    Allergen Reactions  . Garlic     Medications:  Prior to Admission:  (Not in a hospital admission) Scheduled:   . ondansetron  4 mg Intravenous Once   Continuous:  PRN: Anti-infectives    None       Results for orders placed during the hospital encounter of 07/22/12 (from the past 48 hour(s))  CBC WITH DIFFERENTIAL     Status: Abnormal   Collection Time   07/22/12  7:12 PM      Component Value Range Comment   WBC 14.8 (*) 4.0 - 10.5 K/uL    RBC 5.10  3.87 - 5.11 MIL/uL    Hemoglobin 15.5 (*) 12.0 - 15.0 g/dL    HCT 45.0  36.0 - 46.0 %    MCV 88.2  78.0 - 100.0 fL    MCH 30.4  26.0 - 34.0 pg    MCHC 34.4  30.0 - 36.0 g/dL    RDW 12.7  11.5 - 15.5 %    Platelets 249  150 - 400 K/uL    Neutrophils Relative 90 (*) 43 - 77 %    Neutro Abs 13.3 (*) 1.7 - 7.7 K/uL    Lymphocytes Relative 7 (*) 12 - 46 %    Lymphs Abs 1.0  0.7 - 4.0 K/uL    Monocytes Relative 4  3 - 12 %    Monocytes Absolute 0.5  0.1 - 1.0 K/uL  Eosinophils Relative 0  0 - 5 %    Eosinophils Absolute 0.0  0.0 - 0.7 K/uL    Basophils Relative 0  0 - 1 %    Basophils Absolute 0.0  0.0 - 0.1 K/uL   PROTIME-INR     Status: Normal   Collection Time   07/22/12  7:12 PM      Component Value Range Comment   Prothrombin Time 12.1  11.6 - 15.2 seconds    INR 0.90  0.00 - 1.49   APTT     Status: Normal   Collection Time   07/22/12  7:12 PM      Component Value Range Comment   aPTT 27  24 - 37 seconds     Ct Head Wo Contrast  07/22/2012  *RADIOLOGY REPORT*  Clinical Data: 78 year old female status post fall.  CT HEAD WITHOUT CONTRAST  Technique:  Contiguous axial images were obtained from the base of the skull through the vertex without contrast.  Comparison: None.  Findings: Broad-based left lateral scalp superficial hematoma measures up to 6 mm in thickness.  Underlying calvarium intact. Other scalp soft tissues are within normal limits.  Postoperative changes to the orbits.  Trace low density fluid level in  the left sphenoid sinus.  Other Visualized paranasal sinuses and mastoids are clear.  No skull base fracture identified.  Small volume scattered subarachnoid hemorrhage in the basilar cisterns, interpeduncular cistern, slightly greater on the left.  No mass effect.  No intraventricular hemorrhage.  No ventriculomegaly.  No definite parenchymal contusion identified. No evidence of cortically based acute infarction identified.  IMPRESSION: 1.  Small volume subarachnoid hemorrhage in the basilar cisterns, nonspecific but may be related to trauma rather than aneurysm rupture. 2.  No other acute traumatic injury to the brain identified. 3.  Left scalp hematoma without underlying fracture.  Critical Value/emergent results were called by telephone at the time of interpretation on 07/22/2012 at 1900 hours to Dr. Maryan Rued in the ED, who verbally acknowledged these results.   Original Report Authenticated By: Randall An, M.D.    Dg Shoulder Left  07/22/2012  *RADIOLOGY REPORT*  Clinical Data: Fall, left shoulder pain  LEFT SHOULDER - 2+ VIEW  Comparison: None.  Findings: No fracture or dislocation is seen.  Moderate to severe degenerative changes of the acromioclavicular joint.  Associated loose bodies, likely within the subacromial subdeltoid bursa.  Visualized left lung is clear.  IMPRESSION: No fracture or dislocation is seen.  Moderate to severe degenerative changes.  Associated loose bodies, likely within the subacromial subdeltoid bursa.   Original Report Authenticated By: Julian Hy, M.D.     Review of systems: Negative except as above Blood pressure 147/84, pulse 78, temperature 97.8 F (36.6 C), temperature source Oral, resp. rate 18, SpO2 100.00%. Physical exam:  Gen.: An alert and pleasant/healthy-appearing 78 year old white female in no apparent distress.  HEENT: Patient's pupils are miotic and reactive and equal. There is no evidence of CSF otorrhea rhinorrhea, battle signs, raccoon's eyes,  et Ronney Asters. The patient has a small amount of soft tissue swelling over her left parietal scalp.  Neck: Supple without masses or deformities. She has a good range of motion. There is no tenderness. Spurling's testing is negative bilaterally.  Thorax: Symmetric  Abdomen: Soft  Back exam: Unremarkable  Neurologic exam: The patient is alert and oriented x3. Glasgow Coma Scale 15. Cranial nerves II through XII were examined bilaterally and grossly normal. Vision and hearing are grossly normal bilaterally. Her  motor strength is 5 over 5 in her bilateral biceps triceps and grip quadriceps gastrocnemius and dorsiflexors. Deep tendon reflexes are symmetric. There is no ankle clonus. Sensory exam is intact to light touch sensation all tested dermatomes bilaterally. Cerebellar function is intact to rapid alternating movements of the upper extremities bilaterally.  Extremities: The patient has a small contusion on her left knee.  Assessment/Plan: Basal cistern subarachnoid hemorrhage, traumatic brain injury: I have discussed the situation with the patient and her husband and brother. I have told them that given her history is most likely that this subarachnoid hemorrhage is traumatic. I have however told him that is in an atypical location for a traumatic subarachnoid hemorrhage. Although I doubt this was a aneurysmal subarachnoid hemorrhage, I think it prudent to workup further with a CT angiogram. We will obviously hold the patient's Plavix and aspirin. The patient will be admitted to the ICU for observation. I answered all the patient and her family's questions and gave them my card. Pablo Stauffer D 07/22/2012, 7:59 PM

## 2012-07-23 LAB — POCT I-STAT, CHEM 8
BUN: 13 mg/dL (ref 6–23)
Calcium, Ion: 1.07 mmol/L — ABNORMAL LOW (ref 1.13–1.30)
Chloride: 104 mEq/L (ref 96–112)
Creatinine, Ser: 0.7 mg/dL (ref 0.50–1.10)
Glucose, Bld: 139 mg/dL — ABNORMAL HIGH (ref 70–99)
HCT: 48 % — ABNORMAL HIGH (ref 36.0–46.0)
Hemoglobin: 16.3 g/dL — ABNORMAL HIGH (ref 12.0–15.0)
Potassium: 5.2 mEq/L — ABNORMAL HIGH (ref 3.5–5.1)
Sodium: 137 mEq/L (ref 135–145)
TCO2: 26 mmol/L (ref 0–100)

## 2012-07-23 NOTE — Progress Notes (Signed)
Patient ID: Jacqueline Reilly, female   DOB: 1924/06/28, 78 y.o.   MRN: [id number] Subjective:  The patient is alert and pleasant. She looks well. She's had some nausea. The patient's husband does not think she is ready to go home yet.  Objective: Vital signs in last 24 hours: Temp:  [97.8 F (36.6 C)-98.4 F (36.9 C)] 98.4 F (36.9 C) (09/26 1100) Pulse Rate:  [54-91] 54  (09/26 1400) Resp:  [4-27] 21  (09/26 1400) BP: (84-162)/(44-109) 133/58 mmHg (09/26 1400) SpO2:  [92 %-100 %] 93 % (09/26 1400) Weight:  [50.3 kg (110 lb 14.3 oz)] 50.3 kg (110 lb 14.3 oz) (09/25 2000)  Intake/Output from previous day: 09/25 0701 - 09/26 0700 In: 752.5 [I.V.:702.5; IV Piggyback:50] Out: -  Intake/Output this shift: Total I/O In: [id number] [P.O.:120; I.V.:525] Out: -   Physical exam the patient is alert and oriented. She is moving all 4 extremities well. Her speech is normal. Her pupils are equal.  Lab Results:  Basename 07/22/12 1929 07/22/12 1912  WBC -- 14.8*  HGB 16.3* 15.5*  HCT 48.0* 45.0  PLT -- 249   BMET  Basename 07/22/12 1929 07/22/12 1912  NA 137 137  K 5.2* 4.8  CL 104 97  CO2 -- 25  GLUCOSE 139* 140*  BUN 13 10  CREATININE 0.70 0.48*  CALCIUM -- 10.2    Studies/Results: Ct Angio Head W/cm &/or Wo Cm  07/22/2012  *RADIOLOGY REPORT*  Clinical Data:  78 year old female status post fall with basilar subarachnoid hemorrhage.  By report, the patient is anticoagulated.  CT ANGIOGRAPHY HEAD  Technique:  Multidetector CT imaging of the head was performed using the standard protocol during bolus administration of intravenous contrast.  Multiplanar CT image reconstructions including MIPs were obtained to evaluate the vascular anatomy.  Contrast: 63mL OMNIPAQUE IOHEXOL 350 MG/ML SOLN  Comparison:  Head CT without contrast 1849 hours the same day.  Findings:  Basilar subarachnoid hemorrhage persists, most apparent on these postcontrast images in the interpeduncular cistern. Volume of blood  seen stable or decreased.  No intraventricular hemorrhage.  No midline shift or mass effect. Stable gray-white matter differentiation throughout the brain.  No evidence of cortically based acute infarction identified.  No abnormal enhancement the brain.  Left scalp hematoma again noted. Stable visualized osseous structures.  Stable orbits. Visualized paranasal sinuses and mastoids are clear.  Vascular Findings: Major intracranial venous structures appear patent.  Codominant distal vertebral arteries.  Normal PICA vessels.  Normal vertebrobasilar junction.  No basilar stenosis.  SCA and PCA origins are within normal limits.  Diminutive posterior communicating arteries.  Bilateral PCA branches are within normal limits.  Normal distal cervical ICA.  ICA siphons are widely patent with only minimal atherosclerosis.  Both ophthalmic artery origins are within normal limits, however, directed medially from the left supraclinoid ICA just distal to the left ophthalmic artery is a small saccular aneurysm measuring 2 x 2 x 3 mm (thin axial images #33, sagittal images #73).  The remaining left supraclinoid ICA including the left posterior communicating artery origin is normal.  The right ICA siphon is normal.  Both ICA termini, MCA and ACA origins are within normal limits.  Diminutive anterior communicating artery.  Bilateral ACA branches are within normal limits.  Mild irregularity of the bilateral MCA branches which are otherwise normal with no stenosis or major branch occlusion.   Review of the MIP images confirms the above findings.  IMPRESSION:  1.  Positive for a small left superior  hypophoseal ICA aneurysm measuring 2 x 2 x 3 mm. 2.  No other intracranial aneurysm. 3.  Mild intracranial atherosclerosis. 4.  Volume of basilar subarachnoid hemorrhage appears stable and 1849 hours.  Study discussed by telephone with RN Anda Kraft on 07/22/2012 at 2141 hours.   Original Report Authenticated By: Randall An, M.D.     Ct Head Wo Contrast  07/22/2012  *RADIOLOGY REPORT*  Clinical Data: 78 year old female status post fall.  CT HEAD WITHOUT CONTRAST  Technique:  Contiguous axial images were obtained from the base of the skull through the vertex without contrast.  Comparison: None.  Findings: Broad-based left lateral scalp superficial hematoma measures up to 6 mm in thickness.  Underlying calvarium intact. Other scalp soft tissues are within normal limits.  Postoperative changes to the orbits.  Trace low density fluid level in the left sphenoid sinus.  Other Visualized paranasal sinuses and mastoids are clear.  No skull base fracture identified.  Small volume scattered subarachnoid hemorrhage in the basilar cisterns, interpeduncular cistern, slightly greater on the left.  No mass effect.  No intraventricular hemorrhage.  No ventriculomegaly.  No definite parenchymal contusion identified. No evidence of cortically based acute infarction identified.  IMPRESSION: 1.  Small volume subarachnoid hemorrhage in the basilar cisterns, nonspecific but may be related to trauma rather than aneurysm rupture. 2.  No other acute traumatic injury to the brain identified. 3.  Left scalp hematoma without underlying fracture.  Critical Value/emergent results were called by telephone at the time of interpretation on 07/22/2012 at 1900 hours to Dr. Maryan Rued in the ED, who verbally acknowledged these results.   Original Report Authenticated By: Randall An, M.D.    Ct Cervical Spine Wo Contrast  07/22/2012  *RADIOLOGY REPORT*  Clinical Data: 78 year old female status post fall with intracranial subarachnoid hemorrhage.  Pain.  CT CERVICAL SPINE WITHOUT CONTRAST  Technique:  Multidetector CT imaging of the cervical spine was performed. Multiplanar CT image reconstructions were also generated.  Comparison: Head CT from the same day.  Findings: Degenerative changes with partially calcified ligamentous hypertrophy about the odontoid. Visualized  skull base is intact. No atlanto-occipital dissociation.  Cervicothoracic junction alignment is within normal limits.  Bilateral posterior element alignment is within normal limits.  Osteopenia.  Severe facet hypertrophy at C3-C4 on the right and facet ankylosis at C5-C6 on the left.  No acute cervical fracture identified.  Visualized paraspinal soft tissues are within normal limits. Negative lung apices.  The grossly intact upper thoracic levels.  IMPRESSION: No acute fracture or listhesis identified in the cervical spine. Ligamentous injury is not excluded.   Original Report Authenticated By: Randall An, M.D.    Dg Shoulder Left  07/22/2012  *RADIOLOGY REPORT*  Clinical Data: Fall, left shoulder pain  LEFT SHOULDER - 2+ VIEW  Comparison: None.  Findings: No fracture or dislocation is seen.  Moderate to severe degenerative changes of the acromioclavicular joint.  Associated loose bodies, likely within the subacromial subdeltoid bursa.  Visualized left lung is clear.  IMPRESSION: No fracture or dislocation is seen.  Moderate to severe degenerative changes.  Associated loose bodies, likely within the subacromial subdeltoid bursa.   Original Report Authenticated By: Julian Hy, M.D.     Assessment/Plan: Traumatic subarachnoid hemorrhage. I discussed situation with the patient and her husband. We have discussed results of the CT angiogram. At her age and was a tiny proximal internal carotid artery aneurysm which has not likely bled, the risk of treatments would far outweigh the  benefits. I will plan to send her to the floor. We'll likely send her home tomorrow.  LOS: 1 day     Vinita Prentiss D 07/23/2012, 3:37 PM

## 2012-07-23 NOTE — Progress Notes (Signed)
Pt was very pleasant to talk with, even though she had a headache as we talked. Pt deeply regretted the fall she had and anticipates going home tomorrow. Pt did seem slightly confused, continuing to think I was a Presenter, broadcasting rather than a Clinical biochemist.

## 2012-07-24 MED ORDER — PROMETHAZINE HCL 12.5 MG PO TABS: 12.5000 mg | ORAL_TABLET | Freq: Four times a day (QID) | ORAL | Status: DC | PRN

## 2012-07-24 NOTE — Progress Notes (Signed)
Pt had 5 beat run of V Tach. Pt sleeping and stated to feel fine. Will continue to monitor.  Jacqueline Reilly

## 2012-07-24 NOTE — Progress Notes (Signed)
Thank you to Fuller Plan RN CM for this referral.  Chart review complete.  Patient is not appropriate for services because she is not a high cost utilization risk and does not have a clearly noted [facility] PCP designated.

## 2012-07-24 NOTE — Discharge Summary (Signed)
  Physician Discharge Summary  Patient ID: Jacqueline Reilly MRN: [id number] DOB/AGE: 1924/02/28 78 y.o.  Admit date: 07/22/2012 Discharge date: 07/24/2012  Admission Diagnoses: Traumatic subarachnoid hemorrhage, closed injury  Discharge Diagnoses: The same Active Problems:  * No active hospital problems. *    Discharged Condition: good  Hospital Course: I admitted the patient to [facility] on 07/22/2012 after she suffered a fall and a small traumatic subarachnoid hemorrhage. Given the location of the subarachnoid hemorrhage we worked up further with CT and a gram. This demonstrated a small proximal internal carotid artery aneurysm. This was felt to be too small for treatment given the patient's age and the unlikelihood that the aneurysm had bled.  The patient presently wants to go home. She was given oral and written discharge instructions. All her questions were answered. She was instructed to followup with Dr. [name] regarding whether or not he thinks she needs to continue the Plavix.  Consults: None Significant Diagnostic Studies: CT angiogram, CT scan Treatments: Observation Discharge Exam: Blood pressure 150/69, pulse 69, temperature 98.3 F (36.8 C), temperature source Oral, resp. rate 13, height 4\' 11"  (1.499 m), weight 50.3 kg (110 lb 14.3 oz), SpO2 100.00%. The patient is alert and oriented x3. Her speech is normal. Her pupils are equal. Her strength is normal.  Disposition: Home  Discharge Orders    Future Orders Please Complete By Expires   Diet - low sodium heart healthy      Increase activity slowly      Discharge instructions      Comments:   Call [phone] for a followup appointment. Follow up with Dr. [name] regarding resuming the Plavix.   Call MD for:  temperature >100.4      Call MD for:  persistant nausea and vomiting      Call MD for:  severe uncontrolled pain      Call MD for:  redness, tenderness, or signs of infection (pain,  swelling, redness, odor or green/yellow discharge around incision site)      Call MD for:  difficulty breathing, headache or visual disturbances      Call MD for:  hives      Call MD for:  persistant dizziness or light-headedness      Call MD for:  extreme fatigue          Medication List     As of 07/24/2012  8:26 AM    STOP taking these medications         clopidogrel 75 MG tablet   Commonly known as: PLAVIX      TAKE these medications         aspirin 81 MG chewable tablet   Chew 81 mg by mouth daily.      carvedilol 3.125 MG tablet   Commonly known as: COREG   Take 3.125 mg by mouth 2 (two) times daily with a meal.      furosemide 40 MG tablet   Commonly known as: LASIX   Take 40 mg by mouth daily.      lisinopril 10 MG tablet   Commonly known as: PRINIVIL,ZESTRIL   Take 10 mg by mouth daily.      promethazine 12.5 MG tablet   Commonly known as: PHENERGAN   Take 1 tablet (12.5 mg total) by mouth every 6 (six) hours as needed for nausea.         SignedOphelia Charter 07/24/2012, 8:26 AM

## 2012-07-24 NOTE — Progress Notes (Signed)
Prior to discharge patient c/o dizziness.  Patient is currently sitting in chair.  BP WNL. Neuro intact at baseline.  Dr. Arnoldo Morale notified of recent change in patient status.  Dr. Arnoldo Morale is still ok with patient discharging home.  Discharge instructions handed to patient as well as information for follow up appointments with Dr. Arnoldo Morale and Dr. Laurann Montana.

## 2012-07-24 NOTE — Progress Notes (Signed)
Patient had 7 run beat V-tach.  Patient at neuro baseline and vital signs WNL.  Dr. Arnoldo Morale notified of change.  No new orders given.  Will continue to monitor patient.

## 2012-07-24 NOTE — Care Management Note (Signed)
    Page 1 of 1   07/24/2012     12:10:39 PM   CARE MANAGEMENT NOTE 07/24/2012  Patient:  Jacqueline Reilly, Jacqueline Reilly   Account Number:  [id number]  Date Initiated:  07/23/2012  Documentation initiated by:  [facility]  Subjective/Objective Assessment:   Admitted with [facility] after a fall,aneurysm.  Lives with spouse.     Action/Plan:   Anticipated DC Date:  07/24/2012   Anticipated DC Plan:  [facility]  CM consult      Choice offered to / List presented to:             Status of service:  Completed, signed off Medicare Important Message given?   (If response is "NO", the following Medicare IM given date fields will be blank) Date Medicare IM given:   Date Additional Medicare IM given:    Discharge Disposition:  HOME/SELF CARE  Per UR Regulation:  Reviewed for med. necessity/level of care/duration of stay  If discussed at [place] of Stay Meetings, dates discussed:    Comments:  07/24/12 Spoke to patient and her husband with [facility] regarding Mr Saab request for home assistance. Mr Gilkerson is concerned that their home is not tidy enough for patient to return home. Ms Mailey stated that the home will be fine and she is ready for discharge. Patient states that she feels she is back to her normal self. Gave them a list of private duty agencies which offer personal care and home care services. Made referral to Corliss Blacker with [facility] to evaluate whether patient would be appropriate for their program. No home health needs identified. Fuller Plan RN, BSN, CCM

## 2012-07-24 NOTE — Clinical Social Work Psychosocial (Signed)
     Clinical Social Work Department BRIEF PSYCHOSOCIAL ASSESSMENT 07/24/2012  Patient:  Jacqueline Reilly, Jacqueline Reilly     Account Number:  [id number]     Admit date:  07/22/2012  Clinical Social Worker:  Ulyess Blossom  Date/Time:  07/24/2012 10:20 AM  Referred by:  RN  Date Referred:  07/24/2012 Referred for  Other - See comment   Other Referral:   psychosocial needs   Interview type:  Patient Other interview type:   and patient husband    PSYCHOSOCIAL DATA Living Status:  HUSBAND Admitted from facility:   Level of care:   Primary support name:  Jacqueline Reilly Primary support relationship to patient:  SPOUSE Degree of support available:   strong    CURRENT CONCERNS Current Concerns  Other - See comment   Other Concerns:   psychosocial needs    SOCIAL WORK ASSESSMENT / PLAN CSW received referral from RN that pt husband requesting assistance with food/home care/transportation. CSW and RNCM spoke with RN who stated that pt is stating that she does not have any concerns about returning home, but pt spouse has concerns.    CSW and RNCM met with pt and pt spouse at bedside. Pt stated that she did not have any concerns about returning home and does not identify any resources needed. However, pt husband states that he is concerned that pt and pt husband home is "not ready" for pt to return. CSW asked for clarification about pt husband statement and pt husband discussed that the home is not clean and the sheets need to be changed. CSW inquired about if pt and pt husband have been receiving assistance at home and pt husband reports they have had assistance, but they did not clean the house to the pt husband satisfaction. CSW and RNCM discussed that MD feels pt is medically ready for discharge and pt husband stated that he was unaware of that, but agreeable to pt returning home even though he continues to feel that the home is not ready for pt per his report. Pt husband discussed that pt  brother assist with transportation of pt and his main concern is having the home clean prior to pt returning. RNCM provided private duty agency list to pt husband. CSW clarified pt husband questions about process of discharge and pt husband contacted pt brother who plans to transport pt. No further social work needs identified at this time. CSW signing off.   Assessment/plan status:  Psychosocial Support/Ongoing Assessment of Needs Other assessment/ plan:   Information/referral to community resources:   Referral to [facility]    PATIENTS/FAMILYS RESPONSE TO PLAN OF CARE: Pt alert and oriented x 4. Pt stated that she does not have concerns about returning home and did not identify any needs. Pt husband appreciative of private duty list, but did not appear satisfied that pt was returning home today.

## 2012-08-23 DIAGNOSIS — Z23 Encounter for immunization: Secondary | ICD-10-CM | POA: Diagnosis not present

## 2012-08-25 DIAGNOSIS — S066X9A Traumatic subarachnoid hemorrhage with loss of consciousness of unspecified duration, initial encounter: Secondary | ICD-10-CM | POA: Diagnosis not present

## 2012-10-26 DIAGNOSIS — Z0000 Encounter for general adult medical examination without abnormal findings: Secondary | ICD-10-CM | POA: Diagnosis not present

## 2012-10-26 DIAGNOSIS — E785 Hyperlipidemia, unspecified: Secondary | ICD-10-CM | POA: Diagnosis not present

## 2012-10-26 DIAGNOSIS — I2510 Atherosclerotic heart disease of native coronary artery without angina pectoris: Secondary | ICD-10-CM | POA: Diagnosis not present

## 2012-10-26 DIAGNOSIS — Z1331 Encounter for screening for depression: Secondary | ICD-10-CM | POA: Diagnosis not present

## 2012-10-26 DIAGNOSIS — I5020 Unspecified systolic (congestive) heart failure: Secondary | ICD-10-CM | POA: Diagnosis not present

## 2013-01-22 DIAGNOSIS — Z1331 Encounter for screening for depression: Secondary | ICD-10-CM | POA: Diagnosis not present

## 2013-01-22 DIAGNOSIS — G479 Sleep disorder, unspecified: Secondary | ICD-10-CM | POA: Diagnosis not present

## 2013-09-29 ENCOUNTER — Encounter: Payer: Self-pay | Admitting: General Surgery

## 2013-09-30 ENCOUNTER — Encounter: Payer: Self-pay | Admitting: General Surgery

## 2013-09-30 ENCOUNTER — Encounter: Payer: Self-pay | Admitting: Cardiology

## 2013-09-30 ENCOUNTER — Telehealth: Payer: Self-pay | Admitting: Cardiology

## 2013-09-30 ENCOUNTER — Ambulatory Visit (INDEPENDENT_AMBULATORY_CARE_PROVIDER_SITE_OTHER): Payer: Medicare Other | Admitting: Cardiology

## 2013-09-30 VITALS — BP 144/94 | HR 83 | Ht 59.0 in | Wt 107.0 lb

## 2013-09-30 DIAGNOSIS — I2510 Atherosclerotic heart disease of native coronary artery without angina pectoris: Secondary | ICD-10-CM | POA: Insufficient documentation

## 2013-09-30 DIAGNOSIS — I420 Dilated cardiomyopathy: Secondary | ICD-10-CM | POA: Insufficient documentation

## 2013-09-30 DIAGNOSIS — R0602 Shortness of breath: Secondary | ICD-10-CM | POA: Insufficient documentation

## 2013-09-30 DIAGNOSIS — I2589 Other forms of chronic ischemic heart disease: Secondary | ICD-10-CM

## 2013-09-30 DIAGNOSIS — I10 Essential (primary) hypertension: Secondary | ICD-10-CM | POA: Insufficient documentation

## 2013-09-30 DIAGNOSIS — I255 Ischemic cardiomyopathy: Secondary | ICD-10-CM | POA: Insufficient documentation

## 2013-09-30 NOTE — Progress Notes (Signed)
[place], [place],   [id number] Phone: [phone] Fax:  [phone]  Date:  09/30/2013   ID:  Jacqueline Reilly, DOB [birth year], MRN [id number]  PCP:  Irven Shelling, MD  Cardiologist:  Dr. Radford Pax     History of Present Illness: Jacqueline Reilly is a 79 y.o. female with a history of ASCAD, severe LV dysfunction by cath 2007, HTN, dyslipidemia and AAA who presents today for evaluation of SOB.  She says that earlier this week she had an episode of severe SOB while up moving around answering the telephone and lasted a few seconds.  She has had several other episodes of SOB when up moving around.  She denies any chest pain or pressure.  She denies any LE edema, dizziness or syncope.  She rarely has some palpitations.  Of note she was hospitalized about 1 year ago with a small head bleed from a fall and was noted to have 1 run of 7 beats of VT on monitor.     Wt Readings from Last 3 Encounters:  09/30/13 107 lb (48.535 kg)  07/22/12 110 lb 14.3 oz (50.3 kg)  05/01/12 113 lb 3.2 oz (51.347 kg)     Past Medical History  Diagnosis Date  . PVD (peripheral vascular disease)   . Ventricular dysfunction     left; ischemic  . Hyperlipidemia   . AAA (abdominal aortic aneurysm)   . Renal artery stenosis   . Hypertension   . Coronary artery disease 2007    moderate ASCAD of the left system s/p PCI of the D2 and PCI of the left circ 03/2009    Current Outpatient Prescriptions  Medication Sig Dispense Refill  . acetaminophen (TYLENOL) 500 MG tablet Take 500 mg by mouth every 6 (six) hours as needed.      Marland Kitchen aspirin 81 MG chewable tablet Chew 81 mg by mouth daily.      . carvedilol (COREG) 3.125 MG tablet Take 3.125 mg by mouth 2 (two) times daily with a meal.      . furosemide (LASIX) 40 MG tablet Take 40 mg by mouth daily.      Marland Kitchen lisinopril (PRINIVIL,ZESTRIL) 10 MG tablet Take 10 mg by mouth daily.      . nitroGLYCERIN (NITROSTAT) 0.4 MG SL tablet Place 0.4 mg under the  tongue every 5 (five) minutes as needed for chest pain.      . promethazine (PHENERGAN) 12.5 MG tablet Take 1 tablet (12.5 mg total) by mouth every 6 (six) hours as needed for nausea.  30 tablet  0   No current facility-administered medications for this visit.    Allergies:    Allergies  Allergen Reactions  . Codeine Nausea Only  . Garlic     Social History:  The patient  reports that she has never smoked. She has never used smokeless tobacco. She reports that she drinks alcohol. She reports that she does not use illicit drugs.   Family History:  The patient's family history includes Heart disease in her brother, father, and mother.   ROS:  Please see the history of present illness.      All other systems reviewed and negative.   PHYSICAL EXAM: VS:  BP 144/94  Pulse 83  Ht 4\' 11"  (1.499 m)  Wt 107 lb (48.535 kg)  BMI 21.60 kg/m2 Well nourished, well developed, in no acute distress HEENT: normal Neck: no JVD Cardiac:  normal S1, S2; RRR; no murmur Lungs:  clear to auscultation bilaterally, no wheezing, rhonchi or rales Abd: soft, nontender, no hepatomegaly Ext: no edema Skin: warm and dry Neuro:  CNs 2-12 intact, no focal abnormalities noted  EKG:     NSR with PVC's, nonspecific ST abnormality  ASSESSMENT AND PLAN:  1. DOE with known history of ischemic DCM in the past  - Check 2D echo to reassess LVF  - Lexiscan Myoview to evaluate for ischemia 2. Ischemic DCM - EF 20-25% at time of cath - lost to cardiac followup- last OV with Dr. Irish Lack was 2011. 3. ASCAD s/p remote PCI of the left circ and diagonal 4. HTN 5. Dyslipidemia  Followup with me 1 week after stress test done  Signed, Fransico Him, MD 09/30/2013 9:50 AM

## 2013-09-30 NOTE — Telephone Encounter (Signed)
New message     Wife has 9:30 appt with Dr Herbert Pun want to talk to nurse or doctor after she is seen--he is unable to come to the appt with her.

## 2013-09-30 NOTE — Patient Instructions (Signed)
Your physician recommends that you continue on your current medications as directed. Please refer to the Current Medication list given to you today.  Your physician has requested that you have a lexiscan myoview. For further information please visit [email or web address]. Please follow instruction sheet, as given.  Your physician has requested that you have an echocardiogram. Echocardiography is a painless test that uses sound waves to create images of your heart. It provides your doctor with information about the size and shape of your heart and how well your heart's chambers and valves are working. This procedure takes approximately one hour. There are no restrictions for this procedure.  Your physician recommends that you schedule a follow-up appointment in: One week after Stress test in done with Dr Radford Pax

## 2013-09-30 NOTE — Telephone Encounter (Signed)
To Dr. Radford Pax. Was Pts husband not with her today at appt? She had a gentleman in the room with her

## 2013-09-30 NOTE — Telephone Encounter (Signed)
Spoke with Husband and made him aware of the ov today per PTs ok

## 2013-09-30 NOTE — Telephone Encounter (Signed)
Gentleman in room with patient is her brother

## 2013-10-03 ENCOUNTER — Telehealth: Payer: Self-pay | Admitting: Physician Assistant

## 2013-10-03 NOTE — Telephone Encounter (Signed)
Patient called the answering service c/o increased DOE/SOB, weight gain of 4 lbs and trace ankle swelling over the past 1-2 days. She has a history of ischemic CM, EF 20-25% and was seen in the office recently. Echo and stress test have been scheduled for later this month. She denies chest pain. Advised to take an additional Lasix today and tomorrow. She will monitor her weight, avoid salt and fluid, increase potassium in her diet. Advised to take an extra Lasix if weight increases > 3 lbs in one day or > 5 lbs in two days going forward. Advised if symptoms persist or worsen, option of presenting to the nearest ED for formal evaluation is available. She understood and agreed.   Jacquelynn Cree, PA-C 10/03/2013 5:09 PM

## 2013-10-05 ENCOUNTER — Encounter: Payer: Self-pay | Admitting: Interventional Cardiology

## 2013-10-05 ENCOUNTER — Ambulatory Visit (INDEPENDENT_AMBULATORY_CARE_PROVIDER_SITE_OTHER): Payer: Medicare Other | Admitting: Interventional Cardiology

## 2013-10-05 VITALS — BP 122/86 | HR 92 | Ht 59.0 in | Wt 105.0 lb

## 2013-10-05 DIAGNOSIS — R0602 Shortness of breath: Secondary | ICD-10-CM

## 2013-10-05 DIAGNOSIS — I2589 Other forms of chronic ischemic heart disease: Secondary | ICD-10-CM

## 2013-10-05 DIAGNOSIS — I10 Essential (primary) hypertension: Secondary | ICD-10-CM | POA: Diagnosis not present

## 2013-10-05 DIAGNOSIS — I2510 Atherosclerotic heart disease of native coronary artery without angina pectoris: Secondary | ICD-10-CM | POA: Diagnosis not present

## 2013-10-05 DIAGNOSIS — I255 Ischemic cardiomyopathy: Secondary | ICD-10-CM

## 2013-10-05 DIAGNOSIS — I714 Abdominal aortic aneurysm, without rupture: Secondary | ICD-10-CM

## 2013-10-05 NOTE — Progress Notes (Signed)
Patient ID: Jacqueline Reilly, female   DOB: 01/31/1924, 79 y.o.   MRN: [id number]    [place], [place], [place]  [address] Phone: [phone] Fax:  [phone]  Date:  10/05/2013   ID:  Jacqueline Reilly, DOB 1924/05/14, MRN [id number]  PCP:  Irven Shelling, MD      History of Present Illness: Jacqueline Reilly is a 79 y.o. female who has a history of coronary artery disease and cardiomyopathy. She had a possible TIA several years ago. That's the last time I saw her. She presented with shortness of breath several days ago. She was started on a diuretic and her symptoms have improved. A couple of nights ago, or shortness of breath got worse. She doubled her dose of Lasix and then this improved her symptoms. Overall, she is feeling better. She does not report swelling in her legs. She is able to lie on a couple of pillows at night to sleep. She does have some dyspnea on exertion and some fatigue symptoms. She is here for further followup. She does have an echocardiogram and nuclear stress test scheduled for later this month.   Wt Readings from Last 3 Encounters:  10/05/13 105 lb (47.628 kg)  09/30/13 107 lb (48.535 kg)  07/22/12 110 lb 14.3 oz (50.3 kg)     Past Medical History  Diagnosis Date  . PVD (peripheral vascular disease)   . Ventricular dysfunction     left; ischemic  . Hyperlipidemia   . AAA (abdominal aortic aneurysm)   . Renal artery stenosis   . Hypertension   . Coronary artery disease 2007    moderate ASCAD of the left system s/p PCI of the D2 and PCI of the left circ 03/2009    Current Outpatient Prescriptions  Medication Sig Dispense Refill  . acetaminophen (TYLENOL) 500 MG tablet Take 500 mg by mouth every 6 (six) hours as needed.      Marland Kitchen aspirin 81 MG chewable tablet Chew 81 mg by mouth daily.      . carvedilol (COREG) 3.125 MG tablet Take 3.125 mg by mouth 2 (two) times daily with a meal.      . furosemide (LASIX) 40 MG tablet Take 40 mg by mouth  daily.      Marland Kitchen lisinopril (PRINIVIL,ZESTRIL) 10 MG tablet Take 10 mg by mouth daily.      . nitroGLYCERIN (NITROSTAT) 0.4 MG SL tablet Place 0.4 mg under the tongue every 5 (five) minutes as needed for chest pain.      . promethazine (PHENERGAN) 12.5 MG tablet Take 1 tablet (12.5 mg total) by mouth every 6 (six) hours as needed for nausea.  30 tablet  0   No current facility-administered medications for this visit.    Allergies:    Allergies  Allergen Reactions  . Codeine Nausea Only  . Garlic     Social History:  The patient  reports that she has never smoked. She has never used smokeless tobacco. She reports that she drinks alcohol. She reports that she does not use illicit drugs.   Family History:  The patient's family history includes Heart disease in her brother, father, and mother.   ROS:  Please see the history of present illness.  No nausea, vomiting.  No fevers, chills.  No focal weakness.  No dysuria.  All other systems reviewed and negative.   PHYSICAL EXAM: VS:  BP 122/86  Pulse 92  Ht 4\' 11"  (1.499 m)  Wt 105 lb (47.628 kg)  BMI 21.20 kg/m2  SpO2 95% Well nourished, well developed, in no acute distress HEENT: normal Neck: no JVD, no carotid bruits Cardiac:  normal S1, S2; RRR;  Lungs:  clear to auscultation bilaterally, no wheezing, rhonchi or rales Abd: soft, nontender, no hepatomegaly Ext: no edema Skin: warm and dry Neuro:   no focal abnormalities noted  EKG:  Normal sinus rhythm with PVCs, LVH     ASSESSMENT AND PLAN:  1. Likely acute on chronic systolic heart failure: Cardiomyopathy that he was out of proportion to her coronary disease in the past. We'll check electrolytes and BNP. If BNP is elevated, will likely increase Lasix to 40 mg twice a day. Her blood pressure is controlled. Continue ACE inhibitor and beta blocker. When her symptoms are better controlled, we'll uptitrate beta blocker to try and get her heart rate closer to 60. 2. I reviewed her  prior records. She has coronary artery disease with a stent to her circumflex done back several years ago. She is maintained on Plavix and has not had bleeding problems. 3. Hypertension: Blood pressure well controlled. 4. She had moderate renal artery stenosis. She did not have any type of angioplasty for this. 5. She has had a small infrarenal aortic aneurysm. 6. There continues to be a strange family dynamic with her and her husband.  She did not want him to know the results of this visit. They have had this type of relationship in the past. She has not wanted him in the room when she was seen by me. Then after the visit, he would want to speak to me alone for an update about the visit.  Signed, Mina Marble, MD, [facility] 10/05/2013 4:06 PM

## 2013-10-05 NOTE — Patient Instructions (Signed)
Your physician recommends that you return for lab work today bmet and bnp.  All furture appointments should be with Dr. Irish Lack.

## 2013-10-06 LAB — BASIC METABOLIC PANEL
BUN: 17 mg/dL (ref 6–23)
CO2: 30 mEq/L (ref 19–32)
Calcium: 9.1 mg/dL (ref 8.4–10.5)
Chloride: 103 mEq/L (ref 96–112)
Creatinine, Ser: 0.8 mg/dL (ref 0.4–1.2)
GFR: 75.23 mL/min (ref >60.00)
Glucose, Bld: 103 mg/dL — ABNORMAL HIGH (ref 70–99)
Potassium: 4.1 mEq/L (ref 3.5–5.1)
Sodium: 143 mEq/L (ref 135–145)

## 2013-10-06 LAB — BRAIN NATRIURETIC PEPTIDE: Pro B Natriuretic peptide (BNP): 3469 pg/mL — ABNORMAL HIGH (ref 0.0–100.0)

## 2013-10-07 ENCOUNTER — Telehealth: Payer: Self-pay | Admitting: Interventional Cardiology

## 2013-10-07 DIAGNOSIS — I714 Abdominal aortic aneurysm, without rupture: Secondary | ICD-10-CM | POA: Insufficient documentation

## 2013-10-07 DIAGNOSIS — Z79899 Other long term (current) drug therapy: Secondary | ICD-10-CM

## 2013-10-07 MED ORDER — POTASSIUM CHLORIDE CRYS ER 20 MEQ PO TBCR: 20.0000 meq | EXTENDED_RELEASE_TABLET | Freq: Every day | ORAL | Status: DC

## 2013-10-07 MED ORDER — FUROSEMIDE 40 MG PO TABS: 40.0000 mg | ORAL_TABLET | Freq: Two times a day (BID) | ORAL | Status: DC

## 2013-10-07 NOTE — Telephone Encounter (Signed)
Called pt back with lab results.

## 2013-10-07 NOTE — Telephone Encounter (Signed)
Message copied by Alcario Drought on Thu Oct 07, 2013 12:10 PM ------      Message from: Jettie Booze      Created: Thu Oct 07, 2013 12:34 AM       Increase to Lasix 40 mg PO BID.  BMet in 2 weeks.  Potassium 20 mEq daily while taking lasix twice a day. ------

## 2013-10-07 NOTE — Telephone Encounter (Signed)
New message ° ° ° °Returning Jacqueline Reilly's call °

## 2013-10-08 ENCOUNTER — Ambulatory Visit: Payer: Medicare Other | Admitting: Cardiology

## 2013-10-12 ENCOUNTER — Encounter: Payer: Self-pay | Admitting: Cardiology

## 2013-10-12 ENCOUNTER — Ambulatory Visit (HOSPITAL_COMMUNITY): Payer: Medicare Other | Attending: Cardiology | Admitting: Radiology

## 2013-10-12 DIAGNOSIS — I2589 Other forms of chronic ischemic heart disease: Secondary | ICD-10-CM | POA: Insufficient documentation

## 2013-10-12 DIAGNOSIS — I2510 Atherosclerotic heart disease of native coronary artery without angina pectoris: Secondary | ICD-10-CM | POA: Insufficient documentation

## 2013-10-12 DIAGNOSIS — I701 Atherosclerosis of renal artery: Secondary | ICD-10-CM | POA: Insufficient documentation

## 2013-10-12 DIAGNOSIS — Z8673 Personal history of transient ischemic attack (TIA), and cerebral infarction without residual deficits: Secondary | ICD-10-CM | POA: Diagnosis not present

## 2013-10-12 DIAGNOSIS — I714 Abdominal aortic aneurysm, without rupture, unspecified: Secondary | ICD-10-CM | POA: Insufficient documentation

## 2013-10-12 DIAGNOSIS — E785 Hyperlipidemia, unspecified: Secondary | ICD-10-CM | POA: Diagnosis not present

## 2013-10-12 DIAGNOSIS — R0602 Shortness of breath: Secondary | ICD-10-CM | POA: Diagnosis not present

## 2013-10-12 DIAGNOSIS — I10 Essential (primary) hypertension: Secondary | ICD-10-CM | POA: Diagnosis not present

## 2013-10-12 NOTE — Progress Notes (Signed)
Echocardiogram performed.  

## 2013-10-15 ENCOUNTER — Ambulatory Visit (HOSPITAL_COMMUNITY): Payer: Medicare Other | Attending: Cardiology | Admitting: Radiology

## 2013-10-15 ENCOUNTER — Other Ambulatory Visit (INDEPENDENT_AMBULATORY_CARE_PROVIDER_SITE_OTHER): Payer: Medicare Other

## 2013-10-15 ENCOUNTER — Encounter: Payer: Self-pay | Admitting: Cardiology

## 2013-10-15 DIAGNOSIS — Z79899 Other long term (current) drug therapy: Secondary | ICD-10-CM

## 2013-10-15 DIAGNOSIS — R0989 Other specified symptoms and signs involving the circulatory and respiratory systems: Secondary | ICD-10-CM

## 2013-10-15 DIAGNOSIS — R0602 Shortness of breath: Secondary | ICD-10-CM

## 2013-10-15 LAB — BASIC METABOLIC PANEL
BUN: 15 mg/dL (ref 6–23)
CO2: 31 mEq/L (ref 19–32)
Calcium: 9.2 mg/dL (ref 8.4–10.5)
Chloride: 102 mEq/L (ref 96–112)
Creatinine, Ser: 0.7 mg/dL (ref 0.4–1.2)
GFR: 89.87 mL/min (ref >60.00)
Glucose, Bld: 91 mg/dL (ref 70–99)
Potassium: 3.9 mEq/L (ref 3.5–5.1)
Sodium: 141 mEq/L (ref 135–145)

## 2013-10-15 MED ORDER — TECHNETIUM TC 99M SESTAMIBI GENERIC - CARDIOLITE
11.0000 | Freq: Once | INTRAVENOUS | Status: AC | PRN
  Administered 2013-10-15: 11 via INTRAVENOUS

## 2013-10-19 ENCOUNTER — Ambulatory Visit: Payer: Medicare Other | Admitting: Cardiology

## 2013-10-25 ENCOUNTER — Telehealth: Payer: Self-pay | Admitting: Interventional Cardiology

## 2013-10-25 ENCOUNTER — Ambulatory Visit (HOSPITAL_COMMUNITY): Payer: Medicare Other | Attending: Cardiology | Admitting: Radiology

## 2013-10-25 ENCOUNTER — Encounter: Payer: Self-pay | Admitting: Cardiology

## 2013-10-25 ENCOUNTER — Other Ambulatory Visit: Payer: Medicare Other

## 2013-10-25 VITALS — BP 101/80 | HR 81 | Ht 59.0 in | Wt 98.0 lb

## 2013-10-25 DIAGNOSIS — Z8249 Family history of ischemic heart disease and other diseases of the circulatory system: Secondary | ICD-10-CM | POA: Insufficient documentation

## 2013-10-25 DIAGNOSIS — I10 Essential (primary) hypertension: Secondary | ICD-10-CM | POA: Diagnosis not present

## 2013-10-25 DIAGNOSIS — I252 Old myocardial infarction: Secondary | ICD-10-CM | POA: Diagnosis not present

## 2013-10-25 DIAGNOSIS — R0609 Other forms of dyspnea: Secondary | ICD-10-CM | POA: Diagnosis not present

## 2013-10-25 DIAGNOSIS — I2510 Atherosclerotic heart disease of native coronary artery without angina pectoris: Secondary | ICD-10-CM | POA: Insufficient documentation

## 2013-10-25 DIAGNOSIS — R0989 Other specified symptoms and signs involving the circulatory and respiratory systems: Secondary | ICD-10-CM | POA: Insufficient documentation

## 2013-10-25 DIAGNOSIS — R079 Chest pain, unspecified: Secondary | ICD-10-CM | POA: Insufficient documentation

## 2013-10-25 DIAGNOSIS — R5381 Other malaise: Secondary | ICD-10-CM | POA: Insufficient documentation

## 2013-10-25 DIAGNOSIS — R0602 Shortness of breath: Secondary | ICD-10-CM

## 2013-10-25 DIAGNOSIS — I739 Peripheral vascular disease, unspecified: Secondary | ICD-10-CM | POA: Insufficient documentation

## 2013-10-25 DIAGNOSIS — R002 Palpitations: Secondary | ICD-10-CM | POA: Insufficient documentation

## 2013-10-25 MED ORDER — TECHNETIUM TC 99M SESTAMIBI GENERIC - CARDIOLITE
30.0000 | Freq: Once | INTRAVENOUS | Status: AC | PRN
  Administered 2013-10-25: 30 via INTRAVENOUS

## 2013-10-25 MED ORDER — REGADENOSON 0.4 MG/5ML IV SOLN
0.4000 mg | Freq: Once | INTRAVENOUS | Status: AC
  Administered 2013-10-25: 0.4 mg via INTRAVENOUS

## 2013-10-25 NOTE — Telephone Encounter (Signed)
New Message   Pt called states that she forgot to take medications for 3 days and believes that is the reason why she is currently not feeling well.  She has not taken Lasix, Potassium or Carvedilol. Requests a call back to discuss.

## 2013-10-25 NOTE — Progress Notes (Signed)
[facility] [address] [place], [place] [address] [phone]    Cardiology Nuclear Med Study  Jacqueline Reilly is a 79 y.o. female     MRN : [id number]     DOB: [birth year]  Procedure Date: 10/25/2013  Nuclear Med Background Indication for Stress Test:  Evaluation for Ischemia and Stent Patency History:  CAD; MI; Multiple stents; ICM; Echo 09/2013-EF 20-25% Cardiac Risk Factors: Family History - CAD, Hypertension, Lipids and PVD  Symptoms: Chest Pain without exertion (last occurrence weeks ago), DOE, Fatigue, Palpitations and SOB   Nuclear Pre-Procedure Caffeine/Decaff Intake:  None NPO After: 5:00pm   Lungs:  clear O2 Sat: 94% on room air. IV 0.9% NS with Angio Cath:  22g  IV Site: R Wrist  IV Started by:  Annye Rusk, CNMT  Chest Size (in):  32 Cup Size: B  Height: 4\' 11"  (1.499 m)  Weight:  98 lb (44.453 kg)  BMI:  Body mass index is 19.78 kg/(m^2). Tech Comments:  Held all am meds    Nuclear Med Study 1 or 2 day study: 1 day  Stress Test Type:  Lexiscan  Reading MD: Casandra Doffing, MD  Order Authorizing Provider:  Lendell Caprice, MD  Resting Radionuclide: Technetium 65m Sestamibi  Resting Radionuclide Dose: 11.0 mCi   Stress Radionuclide:  Technetium 33m Sestamibi  Stress Radionuclide Dose: 33.0 mCi           Stress Protocol Rest HR: 81 Stress HR: 99  Rest BP: 101/80 Stress BP: 111/70  Exercise Time (min): n/a METS: n/a   Predicted Max HR: 133 bpm % Max HR: 74.44 bpm Rate Pressure Product: 10989   Dose of Adenosine (mg):  n/a Dose of Lexiscan: 0.4 mg  Dose of Atropine (mg): n/a Dose of Dobutamine: n/a mcg/kg/min (at max HR)  Stress Test Technologist: Irven Baltimore, RN  Nuclear Technologist:  Charlton Amor, CNMT     Rest Procedure:  Myocardial perfusion imaging was performed at rest 45 minutes following the intravenous administration of Technetium 11m Sestamibi. Rest ECG: NSR with non-specific ST-T wave changes  Stress Procedure:   The patient received IV Lexiscan 0.4 mg over 15-seconds.  Technetium 7m Sestamibi injected at 30-seconds.  The patient complained of stomach pain, but denied chest pain. Quantitative spect images were obtained after a 45 minute delay. Stress ECG: There are scattered PVCs.  QPS Raw Data Images:  Mild breast attenuation.  Normal left ventricular size. Stress Images:  Decreased uptake in the apex and distal inferior wall. Rest Images:  Normal homogeneous uptake in all areas of the myocardium. Subtraction (SDS):  These findings are consistent with ischemia.; in the apical and inferior territories. Transient Ischemic Dilatation (Normal <1.22):  1.10 Lung/Heart Ratio (Normal <0.45):  0.48  Quantitative Gated Spect Images QGS EDV:  n/a QGS ESV:  n/a  Impression Exercise Capacity:  Lexiscan with no exercise. BP Response:  Normal blood pressure response. Clinical Symptoms:  There is dyspnea. ECG Impression:  No significant ECG changes with Lexiscan. Comparison with Prior Nuclear Study: No previous nuclear study performed  Overall Impression:  Intermediate risk stress nuclear study with ischemia in the apex and distal inferior wall..  LV Ejection Fraction: Study not gated.  LV Wall Motion:  Study not gated  Continue treatment for CHF.  Plan for cath if the patient has anginal sx refractory to medical therapy. Known cardiomyopathy.

## 2013-10-25 NOTE — Telephone Encounter (Signed)
Spoke with pt and she cant explain why she forgot to take her medications for the last two days.  Pt started back taking medications today. Pt had her stress test today and is awaiting for results. Pt if feeling better on medications.

## 2013-10-27 NOTE — Telephone Encounter (Deleted)
error 

## 2013-10-27 NOTE — Telephone Encounter (Signed)
Spoke with pts son and he is very concerned about his mother. He also is concerned about her diet. He feels that she needs to see a dietician.

## 2013-10-27 NOTE — Telephone Encounter (Signed)
Most important thing for the patient is to take her diuretic and heart failure medicines. She does have a mild abnormality on her stress test. In the absence of anginal symptoms, I would like to focus on her heart failure medications. If her symptoms are well controlled, we would not need to pursue angiography. If she has refractory angina, then we could consider angiography. If the medications resolve any angina, that she would not require a cath. Please explain to her son the importance of patient taking her medications as prescribed.

## 2013-10-27 NOTE — Telephone Encounter (Signed)
Spoke with pt about preliminary stress test results. Pt told me I could call her son at [phone].

## 2013-10-27 NOTE — Telephone Encounter (Signed)
Follow up    Pt's son would like a call back please.

## 2013-10-29 NOTE — Telephone Encounter (Addendum)
Pt notified. Appt made for 11/29/13. Pt did not remember me calling her with results on 10/27/13.

## 2013-11-01 DIAGNOSIS — I701 Atherosclerosis of renal artery: Secondary | ICD-10-CM | POA: Diagnosis not present

## 2013-11-01 DIAGNOSIS — E785 Hyperlipidemia, unspecified: Secondary | ICD-10-CM | POA: Diagnosis not present

## 2013-11-01 DIAGNOSIS — Z1331 Encounter for screening for depression: Secondary | ICD-10-CM | POA: Diagnosis not present

## 2013-11-01 DIAGNOSIS — I2589 Other forms of chronic ischemic heart disease: Secondary | ICD-10-CM | POA: Diagnosis not present

## 2013-11-01 DIAGNOSIS — Z0000 Encounter for general adult medical examination without abnormal findings: Secondary | ICD-10-CM | POA: Diagnosis not present

## 2013-11-01 DIAGNOSIS — I10 Essential (primary) hypertension: Secondary | ICD-10-CM | POA: Diagnosis not present

## 2013-11-01 DIAGNOSIS — Z23 Encounter for immunization: Secondary | ICD-10-CM | POA: Diagnosis not present

## 2013-11-01 DIAGNOSIS — R634 Abnormal weight loss: Secondary | ICD-10-CM | POA: Diagnosis not present

## 2013-11-02 ENCOUNTER — Telehealth: Payer: Self-pay | Admitting: Interventional Cardiology

## 2013-11-02 NOTE — Telephone Encounter (Signed)
Spoke with pt and went over medications with her.

## 2013-11-02 NOTE — Telephone Encounter (Signed)
Lmtrc

## 2013-11-02 NOTE — Telephone Encounter (Signed)
New problem:  Pt states she would like a call back regarding her meds. Pt is requesting a call back from Amy.

## 2013-11-11 NOTE — Telephone Encounter (Signed)
Pt has OV 11/29/13. LM for pts son to call back last week as he wanted to know if his mother should see a dietician. Per Dr. Irish Lack if pt is willing he is fine with referring pt. Awaiting call back.

## 2013-11-29 ENCOUNTER — Ambulatory Visit (INDEPENDENT_AMBULATORY_CARE_PROVIDER_SITE_OTHER): Payer: Medicare Other | Admitting: Interventional Cardiology

## 2013-11-29 ENCOUNTER — Encounter: Payer: Self-pay | Admitting: Interventional Cardiology

## 2013-11-29 VITALS — BP 122/74 | HR 88 | Ht 59.0 in | Wt 97.0 lb

## 2013-11-29 DIAGNOSIS — I5022 Chronic systolic (congestive) heart failure: Secondary | ICD-10-CM | POA: Insufficient documentation

## 2013-11-29 DIAGNOSIS — I428 Other cardiomyopathies: Secondary | ICD-10-CM | POA: Diagnosis not present

## 2013-11-29 DIAGNOSIS — I10 Essential (primary) hypertension: Secondary | ICD-10-CM

## 2013-11-29 DIAGNOSIS — I2510 Atherosclerotic heart disease of native coronary artery without angina pectoris: Secondary | ICD-10-CM | POA: Diagnosis not present

## 2013-11-29 DIAGNOSIS — I714 Abdominal aortic aneurysm, without rupture, unspecified: Secondary | ICD-10-CM

## 2013-11-29 NOTE — Progress Notes (Signed)
Patient ID: Jacqueline Reilly, female   DOB: Jun 26, 1924, 80 y.o.   MRN: [id number] Patient ID: Jacqueline Reilly, female   DOB: 05/04/1924, 80 y.o.   MRN: [id number]    Jacqueline Reilly, Jacqueline Reilly,   [id number] Phone: [phone] Fax:  [phone]  Date:  11/29/2013   ID:  Jacqueline Reilly, DOB February 11, 1924, MRN [id number]  PCP:  Jacqueline Shelling, MD      History of Present Illness: Jacqueline Reilly is a 80 y.o. female who has a history of coronary artery disease and cardiomyopathy. She had a possible TIA several years ago, in about 2011. She presented with shortness of breath several months ago. She was started on a diuretic and her symptoms have improved. A couple of weeks ago, or shortness of breath got worse. She forgot to take her medicines, particularly her furosemide. She called the office and was instructed to increase diuretic for a few days. She doubled her dose of Lasix and then this improved her symptoms. Overall, she is feeling better. She does not report swelling in her legs. She is able to lie on a couple of pillows at night to sleep. She does have some dyspnea on exertion and some fatigue symptoms. She is here for further followup. She had an echocardiogram performed in December of 2014 and that showed an ejection fraction of 20-25%. She had a nuclear stress test in December 2014 showing an inferior defect. The overall test is intermediate risk.  Since that time, she is taking her medications regularly. She has not had any orthopnea, PND, leg swelling. She denies any chest discomfort. She has not used any nitroglycerin under her tongue. Overall, she feels fairly well.   Wt Readings from Last 3 Encounters:  11/29/13 97 lb (43.999 kg)  10/25/13 98 lb (44.453 kg)  10/05/13 105 lb (47.628 kg)     Past Medical History  Diagnosis Date  . PVD (peripheral vascular disease)   . Ventricular dysfunction     left; ischemic  . Hyperlipidemia   . AAA (abdominal aortic aneurysm)   .  Renal artery stenosis   . Hypertension   . Coronary artery disease 2007    moderate ASCAD of the left system s/p PCI of the D2 and PCI of the left circ 03/2009    Current Outpatient Prescriptions  Medication Sig Dispense Refill  . acetaminophen (TYLENOL) 500 MG tablet Take 500 mg by mouth every 6 (six) hours as needed.      Marland Kitchen aspirin 81 MG chewable tablet Chew 81 mg by mouth daily.      . carvedilol (COREG) 3.125 MG tablet Take 3.125 mg by mouth 2 (two) times daily with a meal.      . furosemide (LASIX) 40 MG tablet Take 1 tablet (40 mg total) by mouth 2 (two) times daily.  60 tablet  6  . lisinopril (PRINIVIL,ZESTRIL) 10 MG tablet Take 10 mg by mouth daily.      . nitroGLYCERIN (NITROSTAT) 0.4 MG SL tablet Place 0.4 mg under the tongue every 5 (five) minutes as needed for chest pain.      . potassium chloride SA (K-DUR,KLOR-CON) 20 MEQ tablet Take 1 tablet (20 mEq total) by mouth daily.  30 tablet  6  . promethazine (PHENERGAN) 12.5 MG tablet Take 1 tablet (12.5 mg total) by mouth every 6 (six) hours as needed for nausea.  30 tablet  0   No current facility-administered medications for this visit.  Allergies:    Allergies  Allergen Reactions  . Codeine Nausea Only  . Garlic     Social History:  The patient  reports that she has never smoked. She has never used smokeless tobacco. She reports that she drinks alcohol. She reports that she does not use illicit drugs.   Family History:  The patient's family history includes Heart disease in her brother, father, and mother.   ROS:  Please see the history of present illness.  No nausea, vomiting.  No fevers, chills.  No focal weakness.  No dysuria.  All other systems reviewed and negative.  She reports that her memory is not as good. She started cooking lunch yesterday and forgot about it. She in the burning the food and the-. Of note, she lives at home with her husband who is more than 70 years old.  PHYSICAL EXAM: VS:  BP 122/74   Pulse 88  Ht 4\' 11"  (1.499 m)  Wt 97 lb (43.999 kg)  BMI 19.58 kg/m2 Well nourished, well developed, in no acute distress HEENT: normal Neck: no JVD, no carotid bruits Cardiac:  normal S1, S2; RRR;  Lungs:  clear to auscultation bilaterally, no wheezing, rhonchi or rales Abd: soft, nontender, no hepatomegaly Ext: no edema Skin: warm and dry Neuro:   no focal abnormalities noted  EKG:  Normal sinus rhythm with PVCs, LVH     ASSESSMENT AND PLAN:  1. chronic systolic heart failure: Cardiomyopathy that he was out of proportion to her coronary disease in the past. Prior BNP was elevated. she can increase Lasix to 40 mg twice a day for a few days if her weight increases or she notices other signs of fluid overload. Her blood pressure is controlled. Continue ACE inhibitor and beta blocker. We'll uptitrate beta blocker to try and get her heart rate closer to 60.  Recheck echocardiogram in about a month to see if there is any improvement in her LV function with medical therapy. We discussed cardiac catheterization at length. Given that she's not having any angina, she would like to hold off. Her cardiomyopathy seems out of proportion to the abnormality noted on her nuclear study. Given her age and the concern for memory issues, I think it is reasonable to treat her conservatively. 2. I reviewed her prior records. She has coronary artery disease with a stent to her circumflex done back several years ago. She is maintained on Plavix and has not had bleeding problems. 3. Hypertension: Blood pressure well controlled. 4. She had moderate renal artery stenosis. She did not have any type of angioplasty for this. 5. She has had a small infrarenal aortic aneurysm. 6. There continues to be a strange family dynamic with her and her husband.  She did not want him to know the results of this visit. They have had this type of relationship in the past. She has not wanted him in the room when she was seen by me. Then  after the visit, he would want to speak to me alone for an update about the visit.  Today, the husband showed up after the patient left. He wanted update on her condition.  Emeline Darling spoke to the patient and explained that we could not give out her health information without her permission.  Signed, Mina Marble, MD, [facility] 11/29/2013 4:37 PM

## 2013-11-29 NOTE — Patient Instructions (Signed)
Your physician recommends that you schedule a follow-up appointment in 2 months on the same day as echo.  Your physician has requested that you have an echocardiogram IN 2 MONTHS.Marland Kitchen Echocardiography is a painless test that uses sound waves to create images of your heart. It provides your doctor with information about the size and shape of your heart and how well your heart's chambers and valves are working. This procedure takes approximately one hour. There are no restrictions for this procedure.

## 2014-01-17 ENCOUNTER — Encounter: Payer: Self-pay | Admitting: Cardiology

## 2014-01-17 ENCOUNTER — Ambulatory Visit (HOSPITAL_COMMUNITY): Payer: Medicare Other | Attending: Cardiology | Admitting: Radiology

## 2014-01-17 DIAGNOSIS — I428 Other cardiomyopathies: Secondary | ICD-10-CM | POA: Insufficient documentation

## 2014-01-17 NOTE — Progress Notes (Signed)
Limited Echocardiogram performed for LV Function.

## 2014-01-24 ENCOUNTER — Telehealth: Payer: Self-pay | Admitting: Interventional Cardiology

## 2014-01-24 NOTE — Telephone Encounter (Signed)
Spoke with pt and let her know Dr. Irish Lack is okay with her traveling to [place], [place] as long as she feels up to it. Also, made pt 2 month follow up appt in April.

## 2014-01-24 NOTE — Telephone Encounter (Signed)
New message    Patient calling back to speak with nurse. Need to discuss previous conversation

## 2014-01-27 ENCOUNTER — Emergency Department (HOSPITAL_COMMUNITY): Payer: Medicare Other

## 2014-01-27 ENCOUNTER — Telehealth: Payer: Self-pay | Admitting: Cardiology

## 2014-01-27 ENCOUNTER — Emergency Department (HOSPITAL_COMMUNITY)
Admission: EM | Admit: 2014-01-27 | Discharge: 2014-01-27 | Disposition: A | Discharge status: Home / Self Care | Payer: Medicare Other | Attending: Emergency Medicine | Admitting: Emergency Medicine

## 2014-01-27 ENCOUNTER — Encounter (HOSPITAL_COMMUNITY): Payer: Self-pay | Admitting: Emergency Medicine

## 2014-01-27 ENCOUNTER — Telehealth: Payer: Self-pay | Admitting: Interventional Cardiology

## 2014-01-27 DIAGNOSIS — S0990XA Unspecified injury of head, initial encounter: Secondary | ICD-10-CM

## 2014-01-27 DIAGNOSIS — Y939 Activity, unspecified: Secondary | ICD-10-CM | POA: Insufficient documentation

## 2014-01-27 DIAGNOSIS — E785 Hyperlipidemia, unspecified: Secondary | ICD-10-CM | POA: Insufficient documentation

## 2014-01-27 DIAGNOSIS — Z9861 Coronary angioplasty status: Secondary | ICD-10-CM | POA: Diagnosis not present

## 2014-01-27 DIAGNOSIS — S1093XA Contusion of unspecified part of neck, initial encounter: Secondary | ICD-10-CM | POA: Diagnosis not present

## 2014-01-27 DIAGNOSIS — Z7982 Long term (current) use of aspirin: Secondary | ICD-10-CM | POA: Insufficient documentation

## 2014-01-27 DIAGNOSIS — S0083XA Contusion of other part of head, initial encounter: Principal | ICD-10-CM

## 2014-01-27 DIAGNOSIS — Z79899 Other long term (current) drug therapy: Secondary | ICD-10-CM | POA: Insufficient documentation

## 2014-01-27 DIAGNOSIS — I2510 Atherosclerotic heart disease of native coronary artery without angina pectoris: Secondary | ICD-10-CM | POA: Diagnosis not present

## 2014-01-27 DIAGNOSIS — Y929 Unspecified place or not applicable: Secondary | ICD-10-CM | POA: Insufficient documentation

## 2014-01-27 DIAGNOSIS — I10 Essential (primary) hypertension: Secondary | ICD-10-CM | POA: Diagnosis not present

## 2014-01-27 DIAGNOSIS — S0003XA Contusion of scalp, initial encounter: Secondary | ICD-10-CM | POA: Diagnosis not present

## 2014-01-27 DIAGNOSIS — W19XXXA Unspecified fall, initial encounter: Secondary | ICD-10-CM

## 2014-01-27 DIAGNOSIS — W010XXA Fall on same level from slipping, tripping and stumbling without subsequent striking against object, initial encounter: Secondary | ICD-10-CM | POA: Insufficient documentation

## 2014-01-27 NOTE — Telephone Encounter (Signed)
Pt called stating she fell and hit her head about 20 minutes ago. She put her knee on a chair and then she lost her balance and fell. Her husband says in the background that she was dizzy. She did blackout and she has a bump on her head, but it has went down some. Pt feels okay right now, but I told her she needs to go to the ER and have someone drive her immediately to make sure there is no internal bleeding. Her and husband were arguing because he is telling her he wants to bring her here and I told her we can not evaluate her here at our office. Pt agrees to have someone take her to the ER or she will call ems to come get her. Pt told me that she didn't want me to call ems and that she will find someone to take her. Her husband can drive, but he was yelling in the background and it was upsetting her. I am not exactly sure what he was saying, but she kept telling him to get out of the room. Hopefully pt will go to the ER like she said she would.

## 2014-01-27 NOTE — Telephone Encounter (Signed)
Alcario Drought, CMA at 01/27/2014 10:22 AM    Status: Signed        Pt called stating she fell and hit her head about 20 minutes ago. She put her knee on a chair and then she lost her balance and fell. Her husband says in the background that she was dizzy. She did blackout and she has a bump on her head, but it has went down some. Pt feels okay right now, but I told her she needs to go to the ER and have someone drive her immediately to make sure there is no internal bleeding. Her and husband were arguing because he is telling her he wants to bring her here and I told her we can not evaluate her here at our office. Pt agrees to have someone take her to the ER or she will call ems to come get her. Pt told me that she didn't want me to call ems and that she will find someone to take her. Her husband can drive, but he was yelling in the background and it was upsetting her. I am not exactly sure what he was saying, but she kept telling him to get out of the room. Hopefully pt will go to the ER like she said she would.

## 2014-01-27 NOTE — Discharge Instructions (Signed)
Head Injury, Adult You have a head injury. Headaches and throwing up (vomiting) are common after a head injury. It should be easy to wake up from sleeping. Sometimes you must stay in the hospital. Most problems happen within the first 24 hours. Side effects may occur up to 7 10 days after the injury.  WHAT ARE THE TYPES OF HEAD INJURIES? Head injuries can be as minor as a bump. Some head injuries can be more severe. More severe head injuries include:  A jarring injury to the brain (concussion).  A bruise of the brain (contusion). This mean there is bleeding in the brain that can cause swelling.  A cracked skull (skull fracture).  Bleeding in the brain that collects, clots, and forms a bump (hematoma). . WHEN SHOULD I GET HELP RIGHT AWAY?   You are confused or sleepy.  You cannot be woken up.  You feel sick to your stomach (nauseous) or keep throwing up.  Your dizziness or unsteadiness is get worse.  You have very bad, lasting headaches that are not helped by medicine.  You cannot use your arms or legs like normal  You cannot walk.  You notice changes in the black spots in the center of the colored part of your eye (pupil).  You have clear or bloody fluid coming from your nose or ears.  You have trouble seeing. During the next 24 hours after the injury, you must stay with someone who can watch you. This person should get help right away (call 911 in the [place] if you start to shake and are not able to control it (seizures), you become pass out, or you are unable to wake up. HOW CAN I PREVENT A HEAD INJURY IN THE FUTURE?  Wear seat belts.  Wear helmets while bike riding and playing sports like football.  Stay away from dangerous activities around the house. WHEN CAN I RETURN TO NORMAL ACTIVITIES AND ATHLETICS? See your doctor before doing these activities. You should not do normal activities or play contact sports until 1 week after the following symptoms have  stopped:  Headache that does not go away.  Dizziness.  Poor attention.  Confusion.  Memory problems.  Sickness to your stomach or throwing up.  Tiredness.  Fussiness.  Bothered by bright lights or loud noises.  Anxiousness or depression.  Restless sleep. MAKE SURE YOU:   Understand these instructions.  Will watch your condition.  Will get help right away if you are not doing well or get worse. Document Released: 09/26/2008 Document Revised: 08/04/2013 Document Reviewed: 06/21/2013 [facility] Patient Information 2014 [place].

## 2014-01-27 NOTE — Telephone Encounter (Signed)
New message   Husband calling   Patient fell today . Hit her head . Got a little dizzy .

## 2014-01-27 NOTE — ED Notes (Addendum)
She slipped on wet floor this am and hit the R side of her head. She denies pain now but says she felt a little dizzy afterwards and shes not sure if she blacked out. She is a&ox4 now. She takes a daily aspirin

## 2014-01-31 NOTE — ED Provider Notes (Signed)
CSN: [id number]     Arrival date & time 01/27/14  1125 History   First MD Initiated Contact with Patient 01/27/14 1156     Chief Complaint  Patient presents with  . Fall     (Consider location/radiation/quality/duration/timing/severity/associated sxs/prior Treatment) HPI  89yF presenting after fall. Happened this morning. Slipped. On aspirin. Did hit head. Doesn't think had loc. Denies any pain. No acute visual changes. No acute numbness, tingling or loss of strength. Baseline mental status per family member at bedside.   Past Medical History  Diagnosis Date  . PVD (peripheral vascular disease)   . Ventricular dysfunction     left; ischemic  . Hyperlipidemia   . AAA (abdominal aortic aneurysm)   . Renal artery stenosis   . Hypertension   . Coronary artery disease 2007    moderate ASCAD of the left system s/p PCI of the D2 and PCI of the left circ 03/2009   Past Surgical History  Procedure Laterality Date  . Retinal cryopexy      right eye (for retinal detachment)  . Angioplasty     Family History  Problem Relation Age of Onset  . Heart disease Mother   . Heart disease Father   . Heart disease Brother     x 3   History  Substance Use Topics  . Smoking status: Never Smoker   . Smokeless tobacco: Never Used  . Alcohol Use: Yes     Comment: occasionally   OB History   Grav Para Term Preterm Abortions TAB SAB Ect Mult Living                 Review of Systems  All systems reviewed and negative, other than as noted in HPI.   Allergies  Codeine and Garlic  Home Medications   Current Outpatient Rx  Name  Route  Sig  Dispense  Refill  . aspirin 81 MG chewable tablet   Oral   Chew 81 mg by mouth daily.         Marland Kitchen atorvastatin (LIPITOR) 40 MG tablet   Oral   Take 40 mg by mouth daily.         . carvedilol (COREG) 3.125 MG tablet   Oral   Take 3.125 mg by mouth daily.          . diphenhydramine-acetaminophen (TYLENOL PM) 25-500 MG TABS   Oral   Take  1 tablet by mouth at bedtime as needed.         . furosemide (LASIX) 40 MG tablet   Oral   Take 40 mg by mouth.          Marland Kitchen lisinopril (PRINIVIL,ZESTRIL) 5 MG tablet   Oral   Take 5 mg by mouth daily.         . potassium chloride SA (K-DUR,KLOR-CON) 20 MEQ tablet   Oral   Take 20 mEq by mouth daily as needed (potassium levels).         . nitroGLYCERIN (NITROSTAT) 0.4 MG SL tablet   Sublingual   Place 0.4 mg under the tongue every 5 (five) minutes as needed for chest pain.          BP 129/81  Pulse 84  Temp(Src) 98.9 F (37.2 C) (Oral)  Resp 19  Ht 4\' 10"  (1.473 m)  Wt 97 lb (43.999 kg)  BMI 20.28 kg/m2  SpO2 96% Physical Exam  Nursing note and vitals reviewed. Constitutional: She appears well-developed and well-nourished. No distress.  HENT:  Head: Normocephalic.  Small r cephalohematoma. n  Eyes: Conjunctivae are normal. Right eye exhibits no discharge. Left eye exhibits no discharge.  Neck: Neck supple.  Cardiovascular: Normal rate, regular rhythm and normal heart sounds.  Exam reveals no gallop and no friction rub.   No murmur heard. Pulmonary/Chest: Effort normal and breath sounds normal. No respiratory distress.  Abdominal: Soft. She exhibits no distension. There is no tenderness.  Musculoskeletal: She exhibits no edema and no tenderness.  No midline spinal tenderness. No apparent bony tenderness of extremities or pain with ROM of large joints.   Neurological: She is alert. No cranial nerve deficit. She exhibits normal muscle tone. Coordination normal.  Skin: Skin is warm and dry.  Psychiatric: She has a normal mood and affect. Her behavior is normal. Thought content normal.    ED Course  Procedures (including critical care time) Labs Review Labs Reviewed - No data to display Imaging Review No results found.  Ct Head Wo Contrast  01/27/2014   CLINICAL DATA:  Status post fall. Dizziness. Blow to the right side of the head.  EXAM: CT HEAD WITHOUT  CONTRAST  TECHNIQUE: Contiguous axial images were obtained from the base of the skull through the vertex without intravenous contrast.  COMPARISON:  Head CT scan 07/22/2012.  FINDINGS: There is some cortical atrophy and chronic microvascular ischemic change. No acute intracranial abnormality including infarction, hemorrhage, mass lesion, mass effect, midline shift or abnormal extra-axial fluid collection is identified. There is no hydrocephalus or pneumocephalus. The calvarium is intact. Small scalp contusion over the right parietal bone is noted.  IMPRESSION: Small appearing scalp contusion over the right parietal bone without underlying fracture or acute intracranial abnormality.  Atrophy and chronic microvascular ischemic change.   Electronically Signed   By: Inge Rise M.D.   On: 01/27/2014 13:26   EKG Interpretation None      MDM   Final diagnoses:  Fall  Closed head injury    89yf s/p fall and hit head. No loc. nonfocal neuro exam. Clear hx of mechanical mechanism. CT head neg. Head injury instructions discussed. outpt FU as needed otherwise.     Virgel Manifold, MD 01/31/14 [phone]

## 2014-02-01 NOTE — Telephone Encounter (Signed)
Given reduced EF and syncope, would have to consider her for w/u for AICD.  This would entail repeat cardiac cath followed by visit with EP.  Please inform her and cc: Dr. Rayann Heman who takes care of her brother.

## 2014-02-02 NOTE — Telephone Encounter (Signed)
Per Dr. Irish Lack he would like to speak with Dr. Laurann Montana regarding pts mental status before we call her. Also, pt has appt 02/10/14 and we may need to wait and discuss this with her in person.

## 2014-02-04 DIAGNOSIS — L84 Corns and callosities: Secondary | ICD-10-CM | POA: Diagnosis not present

## 2014-02-09 ENCOUNTER — Ambulatory Visit (INDEPENDENT_AMBULATORY_CARE_PROVIDER_SITE_OTHER): Payer: Medicare Other | Admitting: Interventional Cardiology

## 2014-02-09 ENCOUNTER — Encounter: Payer: Self-pay | Admitting: Interventional Cardiology

## 2014-02-09 VITALS — BP 127/74 | HR 87 | Ht 60.0 in | Wt 98.0 lb

## 2014-02-09 DIAGNOSIS — I2510 Atherosclerotic heart disease of native coronary artery without angina pectoris: Secondary | ICD-10-CM | POA: Diagnosis not present

## 2014-02-09 DIAGNOSIS — I10 Essential (primary) hypertension: Secondary | ICD-10-CM | POA: Diagnosis not present

## 2014-02-09 DIAGNOSIS — I5022 Chronic systolic (congestive) heart failure: Secondary | ICD-10-CM

## 2014-02-09 NOTE — Progress Notes (Signed)
Patient ID: Jacqueline Reilly, female   DOB: 09/29/1924, 80 y.o.   MRN: [id number] Patient ID: Jacqueline Reilly, female   DOB: [birth year], 80 y.o.   MRN: [id number] Patient ID: Jacqueline Reilly, female   DOB: June 08, 1924, 80 y.o.   MRN: [id number]    [facility], [place], [place]  [address] Phone: [phone] Fax:  [phone]  Date:  02/09/2014   ID:  Jacqueline Reilly, DOB Dec 20, 1923, MRN [id number]  PCP:  Jacqueline Shelling, MD      History of Present Illness: Jacqueline Reilly is a 80 y.o. female who has a history of coronary artery disease and cardiomyopathy. Jacqueline Reilly had a possible TIA several years ago, in about 2011. Jacqueline Reilly presented with shortness of breath several months ago. Jacqueline Reilly was started on a diuretic and her symptoms have improved. A couple of weeks ago, or shortness of breath got worse. Jacqueline Reilly forgot to take her medicines, particularly her furosemide. Jacqueline Reilly called the office and was instructed to increase diuretic for a few days. Jacqueline Reilly doubled her dose of Lasix and then this improved her symptoms. Overall, Jacqueline Reilly is feeling better. Jacqueline Reilly does not report swelling in her legs. Jacqueline Reilly is able to lie on a couple of pillows at night to sleep. Jacqueline Reilly does have some dyspnea on exertion and some fatigue symptoms. Jacqueline Reilly is here for further followup. Jacqueline Reilly had an echocardiogram performed in December of 2014 and that showed an ejection fraction of 20-25%. Jacqueline Reilly had a nuclear stress test in December 2014 showing an inferior defect. The overall test is intermediate risk.  Since that time, Jacqueline Reilly is taking her medications regularly. Jacqueline Reilly has not had any orthopnea, PND, leg swelling. Jacqueline Reilly denies any chest discomfort. Jacqueline Reilly has not used any nitroglycerin under her tongue. Overall, Jacqueline Reilly feels fairly well.   Wt Readings from Last 3 Encounters:  02/09/14 98 lb (44.453 kg)  01/27/14 97 lb (43.999 kg)  11/29/13 97 lb (43.999 kg)     Past Medical History  Diagnosis Date  . PVD (peripheral vascular disease)   . Ventricular dysfunction      left; ischemic  . Hyperlipidemia   . AAA (abdominal aortic aneurysm)   . Renal artery stenosis   . Hypertension   . Coronary artery disease 2007    moderate ASCAD of the left system s/p PCI of the D2 and PCI of the left circ 03/2009    Current Outpatient Prescriptions  Medication Sig Dispense Refill  . aspirin 81 MG chewable tablet Chew 81 mg by mouth daily.      Marland Kitchen atorvastatin (LIPITOR) 40 MG tablet Take 40 mg by mouth daily.      . diphenhydramine-acetaminophen (TYLENOL PM) 25-500 MG TABS Take 1 tablet by mouth at bedtime as needed.      . furosemide (LASIX) 40 MG tablet Take 40 mg by mouth.       Marland Kitchen lisinopril (PRINIVIL,ZESTRIL) 5 MG tablet Take 5 mg by mouth daily.      . potassium chloride SA (K-DUR,KLOR-CON) 20 MEQ tablet Take 20 mEq by mouth daily as needed (potassium levels).      . carvedilol (COREG) 3.125 MG tablet Take 3.125 mg by mouth daily.       . nitroGLYCERIN (NITROSTAT) 0.4 MG SL tablet Place 0.4 mg under the tongue every 5 (five) minutes as needed for chest pain.       No current facility-administered medications for this visit.    Allergies:    Allergies  Allergen  Reactions  . Codeine Nausea Only  . Garlic Nausea Only    Social History:  The patient  reports that Jacqueline Reilly has never smoked. Jacqueline Reilly has never used smokeless tobacco. Jacqueline Reilly reports that Jacqueline Reilly drinks alcohol. Jacqueline Reilly reports that Jacqueline Reilly does not use illicit drugs.   Family History:  The patient's family history includes Heart disease in her brother, father, and mother.   ROS:  Please see the history of present illness.  No nausea, vomiting.  No fevers, chills.  No focal weakness.  No dysuria.  All other systems reviewed and negative.  Jacqueline Reilly reports that her memory is not as good. At last visit, Jacqueline Reilly mentioned a story where Jacqueline Reilly started cooking lunch yesterday and forgot about it. Jacqueline Reilly in the burning the food and the-. Of note, Jacqueline Reilly lives at home with her husband who is more than 27 years old.  Jacqueline Reilly had a fall and had her  head. Her workup in the ER was negative.  PHYSICAL EXAM: VS:  BP 127/74  Pulse 87  Ht 5' (1.524 m)  Wt 98 lb (44.453 kg)  BMI 19.14 kg/m2 Well nourished, well developed, in no acute distress HEENT: normal Neck: no JVD, no carotid bruits Cardiac:  normal S1, S2; RRR;  Lungs:  clear to auscultation bilaterally, no wheezing, rhonchi or rales Abd: soft, nontender, no hepatomegaly Ext: no edema Skin: warm and dry Neuro:   no focal abnormalities noted  EKG:  Normal sinus rhythm with PVCs, LVH     ASSESSMENT AND PLAN:  1. chronic systolic heart failure: Cardiomyopathy that he was out of proportion to her coronary disease in the past. Prior BNP was elevated. Continue decreased LV systolic function.  I discussed the options for workup with her. We discussed medical therapy. We discussed cardiac catheterization at length. Given her age, Jacqueline Reilly is not interested in any type of invasive workup. Jacqueline Reilly is not interested in defibrillator. Her brother was in the room and he is in agreement as well. We will treat her symptoms if they develop.Given her age and the concern for memory issues, I think it is reasonable to treat her conservatively.  I spoke to her primary care doctor, Dr. Laurann Reilly. He agreed with conservative strategy. He also noted that Jacqueline Reilly sometimes does not take her medicines as prescribed. We will deal with symptoms if they cannot. We will see her back in 6 months. 2. I reviewed her prior records. Jacqueline Reilly has coronary artery disease with a stent to her circumflex done back several years ago. Jacqueline Reilly is maintained on Plavix and has not had bleeding problems. 3. Hypertension: Blood pressure well controlled. 4. Jacqueline Reilly had moderate renal artery stenosis. Jacqueline Reilly did not have any type of angioplasty for this. 5. Jacqueline Reilly has had a small infrarenal aortic aneurysm. 6. There continues to be a strange family dynamic with her and her husband.  Jacqueline Reilly did not want him to know the results of this visit. They have had this type of  relationship in the past. Jacqueline Reilly has not wanted him in the room when Jacqueline Reilly was seen by me. Then after the visit, he would want to speak to me alone for an update about the visit.  Today, the husband showed up but stayed outside of the room during the visit.  Signed, Mina Marble, MD, [facility] 02/09/2014 10:27 AM

## 2014-02-09 NOTE — Patient Instructions (Signed)
Your physician recommends that you continue on your current medications as directed. Please refer to the Current Medication list given to you today.  Your physician wants you to follow-up in: 6 months with Dr. Varanasi.  You will receive a reminder letter in the mail two months in advance. If you don't receive a letter, please call our office to schedule the follow-up appointment.  

## 2014-02-09 NOTE — Telephone Encounter (Signed)
Pt seen in Office today. See OV note.

## 2014-02-10 ENCOUNTER — Ambulatory Visit: Payer: 59 | Admitting: Interventional Cardiology

## 2014-03-03 ENCOUNTER — Ambulatory Visit: Payer: Self-pay | Admitting: Podiatrist

## 2014-03-29 ENCOUNTER — Ambulatory Visit (INDEPENDENT_AMBULATORY_CARE_PROVIDER_SITE_OTHER): Payer: Medicare Other

## 2014-03-29 ENCOUNTER — Ambulatory Visit: Payer: 59

## 2014-03-29 VITALS — BP 110/73 | HR 94 | Resp 16 | Ht 60.0 in | Wt 100.0 lb

## 2014-03-29 DIAGNOSIS — M79609 Pain in unspecified limb: Secondary | ICD-10-CM | POA: Diagnosis not present

## 2014-03-29 DIAGNOSIS — I2510 Atherosclerotic heart disease of native coronary artery without angina pectoris: Secondary | ICD-10-CM | POA: Diagnosis not present

## 2014-03-29 DIAGNOSIS — B351 Tinea unguium: Secondary | ICD-10-CM | POA: Diagnosis not present

## 2014-03-29 NOTE — Progress Notes (Signed)
   Subjective:    Patient ID: Jacqueline Reilly, female    DOB: 1924-08-01, 80 y.o.   MRN: [id number]  HPI Comments: "I did not want to come and I'm here for my toenails"  Patient states that she wasn't aware of this appointment, her husband made this for her to have her toenails trimmed. She is somewhat agitated because she is here. Patient did say her 3rd toe right is sore. The toenail is long and thick and the toe is callused at the tip. She said its been months like this. Her husband would like the rest of her toenails cut as well.     Review of Systems  Respiratory: Positive for shortness of breath.   Allergic/Immunologic: Positive for food allergies.  Psychiatric/Behavioral: Positive for agitation.  All other systems reviewed and are negative.      Objective:   Physical Exam Neurovascular status is intact as follows DP +2/4 PT plus one over 4 bilateral capillary fill time 3 seconds all digits mild varicosities noted neurologically epicritic and proprioceptive sensations intact and symmetric bilateral there is normal plantar response DTRs not listed neurologically skin color pigment normal hair growth absent nails criptotic incurvated friable brittle discolored painful tender symptomatic hallux second third and fourth digits bilateral tender with enclosed shoe wear and ambulation and on palpation third digit right foot has a distal clavus with hemorrhage a keratoses no active wounds or ulcerations noted no discharge or drainage no secondary infection is noted.      Assessment & Plan:  Assessment this time is onychomycosis painful mycotic dystrophic nails 1 through 4 bilateral nails are debrided painful tender symptomatic return for future palliative care is needed maintain appropriate coming shoes in the interim.  Harriet Masson DPM

## 2014-03-29 NOTE — Patient Instructions (Signed)

## 2014-04-01 ENCOUNTER — Telehealth: Payer: Self-pay | Admitting: Interventional Cardiology

## 2014-04-01 NOTE — Telephone Encounter (Signed)
lmtrc

## 2014-04-01 NOTE — Telephone Encounter (Signed)
New message     Pt has a lot of fluid around ankles and legs.  She is on a diuretic.   Please call

## 2014-04-01 NOTE — Telephone Encounter (Signed)
Per Dr. Irish Lack pt should increase lasix to 40 mg BID for 3 days and take potassium during those three days. Pt notified.

## 2014-04-01 NOTE — Telephone Encounter (Signed)
Pt will elevate legs as much as possible.

## 2014-04-02 ENCOUNTER — Telehealth: Payer: Self-pay | Admitting: Physician Assistant

## 2014-04-02 NOTE — Telephone Encounter (Signed)
I received a page from the answering service to call Ms. Ange back regarding ankle swelling. I called her back and asked her how I could help her. She did not remember calling me and asked me how she could help me instead. She thinks maybe her son had called in due to the leg swelling she was having today. There was a lot of noise from people talking loudly in the background so the phone call was prolonged due to the patient having to stop the conversation to "tend to the situation" and ask for quiet. Her legs are more swollen than usual. No other acute symptoms. She took her regular scheduled Lasix this morning and then took an extra Lasix just a short while ago. In light of advanced age I asked her to give the Lasix additional time to work. If legs are still swollen in the morning she is to call back for additional instructions - may need to consider BID dosing for several days. She may need OV this week for further evaluation. She was advised to proceed to ER if symptoms worsen or she develops any concerning sx such as SOB, CP, etc. She verbalized understanding and gratitude. Kynzi Levay PA-C

## 2014-04-07 ENCOUNTER — Telehealth: Payer: Self-pay | Admitting: Interventional Cardiology

## 2014-04-07 NOTE — Telephone Encounter (Signed)
Spoke with pt and she didn't take an extra tablet of lasix for three days because she forgot to write it down. Pt did take an extra lasix yesterday and it helped with her swelling. I told pt she can go ahead and take an extra tablet for the next two days and she wrote it down so she would remember.

## 2014-04-07 NOTE — Telephone Encounter (Signed)
Patient has questions about a medication, please call her back.

## 2014-04-08 NOTE — Telephone Encounter (Signed)
Spoke with pt and she states she has taken an extra lasix tablet the last 2 days. Pt will take an extra lasix tablet tomorrow as well and then she will go back to 1 tablet daily. She will elevate her legs as much as possible over the weekend and she will monitor her salt intake.

## 2014-04-08 NOTE — Telephone Encounter (Signed)
New Message:  Pt is c/o her ankles being swollen. Requests to speak w/ Amy

## 2014-04-13 ENCOUNTER — Telehealth: Payer: Self-pay | Admitting: Interventional Cardiology

## 2014-04-13 NOTE — Telephone Encounter (Signed)
Pt does take lasix 40 mg everyday but rarely takes potassium. Pt mainly complains SOB when she lies down at night that is uncomfortable. Pt has elevated her head at night to help with the SOB and that only helps slightly.

## 2014-04-13 NOTE — Telephone Encounter (Signed)
Pt.notified

## 2014-04-13 NOTE — Telephone Encounter (Signed)
New problem    Pt has trouble breathing at night when she goes to bed and would like to speak to nurse. Please call pt.

## 2014-04-13 NOTE — Telephone Encounter (Signed)
OK to double Lasix for a day to 40 mg BID for 2 days.  SHe should take her potassium during those days.

## 2014-04-13 NOTE — Telephone Encounter (Signed)
Called twice and no answer/no vm.

## 2014-04-18 NOTE — Telephone Encounter (Signed)
Pt took lasix BID for 2 days then back to daily on Saturday. She reports breathing is better when lying down when she takes twice a day. Last night breathing was bothersome again. She had some ankle edema prior to the BID doses, which has improved. She tries to follow a low sodium diet. She may have forgotten to take the potassium one of the two days of extra lasix. She took a dose this AM Pt wants to know if she should further adjust the lasix dose. Advised her will forward to Dr. Irish Lack for advice.

## 2014-04-18 NOTE — Telephone Encounter (Signed)
Follow up     Please clarify instructions for lasix----go back to 2 pills or stay on 1 pill.  She experience sob in afternoons when she is on 1 pill

## 2014-04-18 NOTE — Telephone Encounter (Signed)
OK to continue twice a day Lasix.

## 2014-04-18 NOTE — Telephone Encounter (Signed)
Informed patient that I have sent message to Dr. Irish Lack and will call her when I hear back from him. She verbalizes understanding. Also informed her that her brother called to verbalize his concern about her lasix but I was unable to speak with him due to HIPPA laws. Verbalizes understanding of this also but states that if he wants information about her that it is ok to give it to him. Told her to sign form at front desk when she comes for next appointment.

## 2014-04-18 NOTE — Telephone Encounter (Signed)
Received call from patient's brother, he has same questions pt did this am. Would Dr. Irish Lack like her to take lasix BID, since it seems to make her breathing better. Advised Mr. Gilberto Better that according to her chart I am unable to discuss with him but that I will call the patient to discuss.

## 2014-04-19 NOTE — Telephone Encounter (Signed)
Spoke with patient's brother, Diego Cory. Per patient's request I informed him of her medicine change. He verbalizes understanding and stated he is going to be in touch with her to make sure that she understands and follows correctly.

## 2014-04-19 NOTE — Telephone Encounter (Signed)
Patient made aware to continue lasix 40 mg BID.  She will also continue potassium 20 mEq daily. She wrote this down and read it back to me. She asked if I could call her brother, Jacqueline Reilly [phone] to let him know as well.

## 2014-04-23 ENCOUNTER — Emergency Department (HOSPITAL_COMMUNITY): Payer: Medicare Other

## 2014-04-23 ENCOUNTER — Inpatient Hospital Stay (HOSPITAL_COMMUNITY)
Admission: EM | Admit: 2014-04-23 | Discharge: 2014-04-25 | DRG: 293 | Disposition: A | Discharge status: Home / Self Care | Payer: Medicare Other | Attending: Internal Medicine | Admitting: Internal Medicine

## 2014-04-23 ENCOUNTER — Encounter (HOSPITAL_COMMUNITY): Payer: Self-pay | Admitting: Emergency Medicine

## 2014-04-23 DIAGNOSIS — I10 Essential (primary) hypertension: Secondary | ICD-10-CM | POA: Diagnosis not present

## 2014-04-23 DIAGNOSIS — I519 Heart disease, unspecified: Secondary | ICD-10-CM | POA: Diagnosis not present

## 2014-04-23 DIAGNOSIS — E785 Hyperlipidemia, unspecified: Secondary | ICD-10-CM | POA: Diagnosis present

## 2014-04-23 DIAGNOSIS — Z7982 Long term (current) use of aspirin: Secondary | ICD-10-CM | POA: Diagnosis not present

## 2014-04-23 DIAGNOSIS — I2589 Other forms of chronic ischemic heart disease: Secondary | ICD-10-CM | POA: Diagnosis not present

## 2014-04-23 DIAGNOSIS — M7989 Other specified soft tissue disorders: Secondary | ICD-10-CM

## 2014-04-23 DIAGNOSIS — I5043 Acute on chronic combined systolic (congestive) and diastolic (congestive) heart failure: Principal | ICD-10-CM | POA: Diagnosis present

## 2014-04-23 DIAGNOSIS — I739 Peripheral vascular disease, unspecified: Secondary | ICD-10-CM | POA: Diagnosis not present

## 2014-04-23 DIAGNOSIS — I2510 Atherosclerotic heart disease of native coronary artery without angina pectoris: Secondary | ICD-10-CM | POA: Diagnosis not present

## 2014-04-23 DIAGNOSIS — R0602 Shortness of breath: Secondary | ICD-10-CM

## 2014-04-23 DIAGNOSIS — I509 Heart failure, unspecified: Secondary | ICD-10-CM | POA: Insufficient documentation

## 2014-04-23 DIAGNOSIS — Z9861 Coronary angioplasty status: Secondary | ICD-10-CM

## 2014-04-23 DIAGNOSIS — I714 Abdominal aortic aneurysm, without rupture, unspecified: Secondary | ICD-10-CM | POA: Diagnosis present

## 2014-04-23 DIAGNOSIS — I420 Dilated cardiomyopathy: Secondary | ICD-10-CM

## 2014-04-23 DIAGNOSIS — Z79899 Other long term (current) drug therapy: Secondary | ICD-10-CM | POA: Diagnosis not present

## 2014-04-23 DIAGNOSIS — R9389 Abnormal findings on diagnostic imaging of other specified body structures: Secondary | ICD-10-CM

## 2014-04-23 DIAGNOSIS — I255 Ischemic cardiomyopathy: Secondary | ICD-10-CM | POA: Diagnosis present

## 2014-04-23 LAB — BASIC METABOLIC PANEL
BUN: 20 mg/dL (ref 6–23)
CO2: 28 mEq/L (ref 19–32)
Calcium: 9.2 mg/dL (ref 8.4–10.5)
Chloride: 101 mEq/L (ref 96–112)
Creatinine, Ser: 0.78 mg/dL (ref 0.50–1.10)
GFR calc Af Amer: 84 mL/min — ABNORMAL LOW (ref >90)
GFR calc non Af Amer: 73 mL/min — ABNORMAL LOW (ref >90)
Glucose, Bld: 113 mg/dL — ABNORMAL HIGH (ref 70–99)
Potassium: 4.6 mEq/L (ref 3.7–5.3)
Sodium: 142 mEq/L (ref 137–147)

## 2014-04-23 LAB — TSH: TSH: 0.654 u[IU]/mL (ref 0.350–4.500)

## 2014-04-23 LAB — CBC
HCT: 42.4 % (ref 36.0–46.0)
Hemoglobin: 13.4 g/dL (ref 12.0–15.0)
MCH: 28 pg (ref 26.0–34.0)
MCHC: 31.6 g/dL (ref 30.0–36.0)
MCV: 88.5 fL (ref 78.0–100.0)
Platelets: 288 10*3/uL (ref 150–400)
RBC: 4.79 MIL/uL (ref 3.87–5.11)
RDW: 14.4 % (ref 11.5–15.5)
WBC: 7 10*3/uL (ref 4.0–10.5)

## 2014-04-23 LAB — PRO B NATRIURETIC PEPTIDE: Pro B Natriuretic peptide (BNP): 12261 pg/mL — ABNORMAL HIGH (ref 0–450)

## 2014-04-23 LAB — TROPONIN I
Troponin I: <0.3 ng/mL (ref <0.30)
Troponin I: <0.3 ng/mL (ref <0.30)
Troponin I: <0.3 ng/mL (ref <0.30)
Troponin I: <0.3 ng/mL (ref <0.30)

## 2014-04-23 MED ORDER — ATORVASTATIN CALCIUM 40 MG PO TABS
40.0000 mg | ORAL_TABLET | Freq: Every day | ORAL | Status: DC
  Administered 2014-04-23 – 2014-04-25 (×3): 40 mg via ORAL
  Filled 2014-04-23 (×3): qty 1

## 2014-04-23 MED ORDER — ACETAMINOPHEN 325 MG PO TABS: 650.0000 mg | ORAL_TABLET | Freq: Four times a day (QID) | ORAL | Status: DC | PRN

## 2014-04-23 MED ORDER — ENOXAPARIN SODIUM 30 MG/0.3ML ~~LOC~~ SOLN
30.0000 mg | SUBCUTANEOUS | Status: DC
  Administered 2014-04-23 – 2014-04-25 (×3): 30 mg via SUBCUTANEOUS
  Filled 2014-04-23 (×3): qty 0.3

## 2014-04-23 MED ORDER — NITROGLYCERIN 0.4 MG SL SUBL: 0.4000 mg | SUBLINGUAL_TABLET | SUBLINGUAL | Status: DC | PRN

## 2014-04-23 MED ORDER — ACETAMINOPHEN 650 MG RE SUPP: 650.0000 mg | Freq: Four times a day (QID) | RECTAL | Status: DC | PRN

## 2014-04-23 MED ORDER — LISINOPRIL 5 MG PO TABS
5.0000 mg | ORAL_TABLET | Freq: Every day | ORAL | Status: DC
  Administered 2014-04-23 – 2014-04-25 (×3): 5 mg via ORAL
  Filled 2014-04-23 (×3): qty 1

## 2014-04-23 MED ORDER — FUROSEMIDE 10 MG/ML IJ SOLN
40.0000 mg | Freq: Once | INTRAMUSCULAR | Status: AC
  Administered 2014-04-23: 40 mg via INTRAVENOUS
  Filled 2014-04-23: qty 4

## 2014-04-23 MED ORDER — SODIUM CHLORIDE 0.9 % IJ SOLN
3.0000 mL | Freq: Two times a day (BID) | INTRAMUSCULAR | Status: DC
  Administered 2014-04-23 – 2014-04-25 (×5): 3 mL via INTRAVENOUS

## 2014-04-23 NOTE — Progress Notes (Signed)
VASCULAR LAB PRELIMINARY  PRELIMINARY  PRELIMINARY  PRELIMINARY  Bilateral lower extremity venous Dopplers completed.    Preliminary report:  There is no DVT or SVT noted in the bilateral lower extremities.   KANADY, CANDACE, RVT 04/23/2014, 11:41 AM

## 2014-04-23 NOTE — ED Notes (Signed)
Dr. Alvino Chapel at [facility]. Son at [facility]. Pt admits to some sob. (denies: pain, nausea, dizziness), describes earlier orthopnea.

## 2014-04-23 NOTE — ED Provider Notes (Signed)
CSN: [id number]     Arrival date & time 04/23/14  0136 History   First MD Initiated Contact with Patient 04/23/14 [phone]     Chief Complaint  Patient presents with  . Shortness of Breath     (Consider location/radiation/quality/duration/timing/severity/associated sxs/prior Treatment) Patient is a 80 y.o. female presenting with shortness of breath. The history is provided by the patient and a relative.  Shortness of Breath Associated symptoms: chest pain   Associated symptoms: no abdominal pain, no headaches, no rash and no vomiting    patient with shortness of breath. Worse over the last 2 days. Worse with laying down. She   has worsened in her legs. She states she does get occasional chest pain. No fevers. She has an occasional cough. She states she feels as if she can her primary cardiologist in adjusting her Lasix. Patient's son states that she has not been eating much and likely not taking all of her medications. Past Medical History  Diagnosis Date  . PVD (peripheral vascular disease)   . Ventricular dysfunction     left; ischemic  . Hyperlipidemia   . AAA (abdominal aortic aneurysm)   . Renal artery stenosis   . Hypertension   . Coronary artery disease 2007    moderate ASCAD of the left system s/p PCI of the D2 and PCI of the left circ 03/2009  . CHF (congestive heart failure)    Past Surgical History  Procedure Laterality Date  . Retinal cryopexy      right eye (for retinal detachment)  . Angioplasty    . Coronary stent placement     Family History  Problem Relation Age of Onset  . Heart disease Mother   . Heart disease Father   . Heart disease Brother     x 3   History  Substance Use Topics  . Smoking status: Never Smoker   . Smokeless tobacco: Never Used  . Alcohol Use: Yes     Comment: occasionally   OB History   Grav Para Term Preterm Abortions TAB SAB Ect Mult Living                 Review of Systems  Constitutional: Negative for activity change and  appetite change.  Eyes: Negative for pain.  Respiratory: Positive for shortness of breath. Negative for chest tightness.   Cardiovascular: Positive for chest pain and leg swelling.  Gastrointestinal: Negative for nausea, vomiting, abdominal pain and diarrhea.  Genitourinary: Negative for flank pain.  Musculoskeletal: Negative for back pain and neck stiffness.  Skin: Negative for rash.  Neurological: Negative for weakness, numbness and headaches.  Psychiatric/Behavioral: Negative for behavioral problems.      Allergies  Codeine and Garlic  Home Medications   Prior to Admission medications   Medication Sig Start Date End Date Taking? Authorizing Provider  aspirin 81 MG chewable tablet Chew 81 mg by mouth daily.   Yes Historical Provider, MD  atorvastatin (LIPITOR) 40 MG tablet Take 40 mg by mouth daily. 01/04/14  Yes Historical Provider, MD  diphenhydramine-acetaminophen (TYLENOL PM) 25-500 MG TABS Take 1 tablet by mouth at bedtime as needed.   Yes Historical Provider, MD  furosemide (LASIX) 40 MG tablet Take 40 mg by mouth.  10/07/13  Yes Jettie Booze, MD  lisinopril (PRINIVIL,ZESTRIL) 5 MG tablet Take 5 mg by mouth daily.   Yes Historical Provider, MD  nitroGLYCERIN (NITROSTAT) 0.4 MG SL tablet Place 0.4 mg under the tongue every 5 (five) minutes as needed  for chest pain.   Yes Historical Provider, MD  potassium chloride SA (K-DUR,KLOR-CON) 20 MEQ tablet Take 20 mEq by mouth daily as needed (potassium levels).   Yes Historical Provider, MD  carvedilol (COREG) 3.125 MG tablet Take 3.125 mg by mouth daily.     Historical Provider, MD   BP 98/69  Pulse 75  Temp(Src) 97.7 F (36.5 C) (Oral)  Resp 32  SpO2 90% Physical Exam  Nursing note and vitals reviewed. Constitutional: She is oriented to person, place, and time. She appears well-developed and well-nourished.  Patient is very slender  HENT:  Head: Normocephalic and atraumatic.  Eyes: EOM are normal. Pupils are equal,  round, and reactive to light.  Neck: Normal range of motion. Neck supple. JVD present.  Cardiovascular: Normal rate, regular rhythm and normal heart sounds.   No murmur heard. Pulmonary/Chest: Effort normal.  Mildly harsh breath sounds with few scattered rales at bases  Abdominal: Soft. Bowel sounds are normal. She exhibits no distension. There is no tenderness. There is no rebound and no guarding.  Musculoskeletal: Normal range of motion. She exhibits edema.  Bilateral lower extremity pitting edema  Neurological: She is alert and oriented to person, place, and time. No cranial nerve deficit.  Skin: Skin is warm and dry.  Psychiatric: She has a normal mood and affect. Her speech is normal.    ED Course  Procedures (including critical care time) Labs Review Labs Reviewed  BASIC METABOLIC PANEL - Abnormal; Notable for the following:    Glucose, Bld 113 (*)    GFR calc non Af Amer 73 (*)    GFR calc Af Amer 84 (*)    All other components within normal limits  PRO B NATRIURETIC PEPTIDE - Abnormal; Notable for the following:    Pro B Natriuretic peptide (BNP) 12261.0 (*)    All other components within normal limits  CBC  TROPONIN I    Imaging Review Dg Chest 2 View  04/23/2014   CLINICAL DATA:  Shortness of breath for 1 week  EXAM: CHEST  2 VIEW  COMPARISON:  04/20/2009  FINDINGS: There is cardiomegaly which is likely progressed from previous. Chronic aortic tortuosity. Aortic annular calcification noted in the lateral projection. New diffuse interstitial abnormality with Kerley B-lines. There is cephalized pulmonary blood flow. Some of the opacities appears somewhat coarse, especially at the bases. No evidence of effusion or pneumothorax.  Bilateral glenohumeral osteoarthritis with loose bodies.  IMPRESSION: 1. CHF. 2. Question background chronic lung disease/fibrosis, which would be new from 2010 comparison.   Electronically Signed   By: Jorje Guild M.D.   On: 04/23/2014 03:10      EKG Interpretation None      Date: 04/23/2014  Rate: 79  Rhythm: normal sinus rhythm and premature atrial contractions (PAC)  QRS Axis: left  Intervals: normal  ST/T Wave abnormalities: nonspecific ST/T changes  Conduction Disutrbances:left bundle branch block  Narrative Interpretation:   Old EKG Reviewed: unchanged    MDM   Final diagnoses:  Acute on chronic congestive heart failure, unspecified congestive heart failure type     patient with shortness of breath. History of CHF. He appears to be in CHF now. Questionable compliance. Will admit to internal medicine after discussion with cardiology     Jasper Riling. Alvino Chapel, MD 04/23/14 [phone]

## 2014-04-23 NOTE — Progress Notes (Addendum)
Assessment/Plan: Principal Problem:   Acute on chronic combined systolic and diastolic congestive heart failure - clinically stable at this time. I am going to resume her lisinopril today but her BP is a bit low so I will not add beta blocker back today. In addition, I will use metoprolol succinate and not carvedilol as it is once daily and she admits "I don't like medicines and I don't like doctors." (I did jokingly tell her that was a bad combination for folks with CHF). Long talk with son and father as well as patient about need to take all meds, call when problems, and adjust meds as instructed. I also offered [facility] to help her at home and she is agreeable to that.  Active Problems:   Coronary artery disease   Ischemic dilated cardiomyopathy   Hypertension   AAA (abdominal aortic aneurysm)   Subjective: Patient feeling better and wants to go home. Admits that she has not been taking carvedilol at all and has not been on lisinopril very much.   I reviewed her cardiology interactions and note numerous phone calls about dyspnea. It looks like there are times in which she was confused about what she should do and at other times she was successful.   Objective:  Vital Signs: Filed Vitals:   04/23/14 0530 04/23/14 0545 04/23/14 0600 04/23/14 0707  BP: 104/73 139/101 96/64 115/81  Pulse: 65 79 73 82  Temp:    97.7 F (36.5 C)  TempSrc:    Oral  Resp: 19 22 13 17   Height:    4\' 11"  (1.499 m)  Weight:    44.09 kg (97 lb 3.2 oz)  SpO2: 94% 90% 90% 95%     EXAM: LUNGS: clear  COR: regular rhythm and rate.   No intake or output data in the 24 hours ending 04/23/14 1008  Lab Results:  Recent Labs  04/23/14 0210  NA 142  K 4.6  CL 101  CO2 28  GLUCOSE 113*  BUN 20  CREATININE 0.78  CALCIUM 9.2   No results found for this basename: AST, ALT, ALKPHOS, BILITOT, PROT, ALBUMIN,  in the last 72 hours No results found for this basename: LIPASE, AMYLASE,  in the last 72  hours  Recent Labs  04/23/14 0210  WBC 7.0  HGB 13.4  HCT 42.4  MCV 88.5  PLT 288    Recent Labs  04/23/14 0213 04/23/14 0600  TROPONINI <0.30 <0.30   BNP    Component Value Date/Time   PROBNP 12261.0* 04/23/2014 0213   No results found for this basename: DDIMER,  in the last 72 hours No results found for this basename: HGBA1C,  in the last 72 hours No results found for this basename: CHOL, HDL, LDLCALC, TRIG, CHOLHDL, LDLDIRECT,  in the last 72 hours  Recent Labs  04/23/14 0600  TSH 0.654   No results found for this basename: VITAMINB12, FOLATE, FERRITIN, TIBC, IRON, RETICCTPCT,  in the last 72 hours  Studies/Results: Dg Chest 2 View  04/23/2014   CLINICAL DATA:  Shortness of breath for 1 week  EXAM: CHEST  2 VIEW  COMPARISON:  04/20/2009  FINDINGS: There is cardiomegaly which is likely progressed from previous. Chronic aortic tortuosity. Aortic annular calcification noted in the lateral projection. New diffuse interstitial abnormality with Kerley B-lines. There is cephalized pulmonary blood flow. Some of the opacities appears somewhat coarse, especially at the bases. No evidence of effusion or pneumothorax.  Bilateral glenohumeral osteoarthritis with loose bodies.  IMPRESSION: 1. CHF. 2. Question background chronic lung disease/fibrosis, which would be new from 2010 comparison.   Electronically Signed   By: Jorje Guild M.D.   On: 04/23/2014 03:10   Medications: Medications administered in the last 24 hours reviewed.  Current Medication List reviewed.    LOS: 0 days   Time spent in visit and documentation: 45 minutes.   [facility] Internal Medicine @ [facility] [phone]) 04/23/2014, 10:08 AM

## 2014-04-23 NOTE — ED Notes (Signed)
Pt alert, NAD, calm, interactive, resps e/u, speaking in clear sentences, VSS, HOH, family at [facility].

## 2014-04-23 NOTE — H&P (Signed)
[facility] History and Physical  Patient: Jacqueline Reilly  [id number]  DOB: 06/18/1924  DOS: the patient was seen and examined on 04/23/2014 PCP: Irven Shelling, MD  Chief Complaint: Shortness of breath  HPI: Jacqueline Reilly is a 80 y.o. female with Past medical history of there was no disease, coronary artery disease, ischemic cardiomyopathy. Patient presented with complaints of shortness of breath. He mentions since last one week she has been having progressively worsening shortness of breath on exertion. This is also associated with shortness of breath when lying down. She has PND symptoms. She has also progressively worsening swelling of her legs. She mentions she has on her own started taking extra pill of Lasix because of the fluid since last one week. Also she mentions that she's not taking Coreg on a regular basis. Also she mentions she's taking lisinopril on a regular basis. She has had a toe injury on the right leg 34 weeks ago and has been having of orthotic shoe there. Her right leg is swollen more than her left leg. She denies any complaint of fever, chills, chest pain, palpitation, nausea, vomiting, abdominal pain, diarrhea, burning urination, dizziness, lightheadedness.  The patient is coming from home And at her baseline independent for most of her ADL.  Review of Systems: as mentioned in the history of present illness.  A Comprehensive review of the other systems is negative.  Past Medical History  Diagnosis Date  . PVD (peripheral vascular disease)   . Ventricular dysfunction     left; ischemic  . Hyperlipidemia   . AAA (abdominal aortic aneurysm)   . Renal artery stenosis   . Hypertension   . Coronary artery disease 2007    moderate ASCAD of the left system s/p PCI of the D2 and PCI of the left circ 03/2009  . CHF (congestive heart failure)    Past Surgical History  Procedure Laterality Date  . Retinal cryopexy      right eye (for retinal  detachment)  . Angioplasty    . Coronary stent placement     Social History:  reports that she has never smoked. She has never used smokeless tobacco. She reports that she drinks alcohol. She reports that she does not use illicit drugs.  Allergies  Allergen Reactions  . Codeine Nausea Only  . Garlic Nausea Only    Family History  Problem Relation Age of Onset  . Heart disease Mother   . Heart disease Father   . Heart disease Brother     x 3    Prior to Admission medications   Medication Sig Start Date End Date Taking? Authorizing Provider  aspirin 81 MG chewable tablet Chew 81 mg by mouth daily.   Yes Historical Provider, MD  atorvastatin (LIPITOR) 40 MG tablet Take 40 mg by mouth daily. 01/04/14  Yes Historical Provider, MD  diphenhydramine-acetaminophen (TYLENOL PM) 25-500 MG TABS Take 1 tablet by mouth at bedtime as needed.   Yes Historical Provider, MD  furosemide (LASIX) 40 MG tablet Take 40 mg by mouth.  10/07/13  Yes Jettie Booze, MD  lisinopril (PRINIVIL,ZESTRIL) 5 MG tablet Take 5 mg by mouth daily.   Yes Historical Provider, MD  nitroGLYCERIN (NITROSTAT) 0.4 MG SL tablet Place 0.4 mg under the tongue every 5 (five) minutes as needed for chest pain.   Yes Historical Provider, MD  potassium chloride SA (K-DUR,KLOR-CON) 20 MEQ tablet Take 20 mEq by mouth daily as needed (potassium levels).   Yes Historical Provider,  MD  carvedilol (COREG) 3.125 MG tablet Take 3.125 mg by mouth daily.     Historical Provider, MD    Physical Exam: Filed Vitals:   04/23/14 0415 04/23/14 0430 04/23/14 0445 04/23/14 0500  BP: 111/80 109/82 110/76 98/69  Pulse: 78 79 78 75  Temp:      TempSrc:      Resp: 26 22 32 32  SpO2: 95% 94% 95% 90%    General: Alert, Awake and Oriented to Time, Place and Person. Appear in mild distress Eyes: PERRL ENT: Oral Mucosa clear moist Neck: Moderate JVD Cardiovascular: S1 and S2 Present, aortic systolic Murmur, Peripheral Pulses  Present Respiratory: Bilateral Air entry equal and Decreased, bilateral basal Crackles, no wheezes Abdomen: Bowel Sound Present, Soft and Non tender Skin: No Rash Extremities: Bilateral right more than left Pedal edema, no calf tenderness Neurologic: Grossly no focal neuro deficit.  Labs on Admission:  CBC:  Recent Labs Lab 04/23/14 0210  WBC 7.0  HGB 13.4  HCT 42.4  MCV 88.5  PLT 288    CMP     Component Value Date/Time   NA 142 04/23/2014 0210   K 4.6 04/23/2014 0210   CL 101 04/23/2014 0210   CO2 28 04/23/2014 0210   GLUCOSE 113* 04/23/2014 0210   BUN 20 04/23/2014 0210   CREATININE 0.78 04/23/2014 0210   CALCIUM 9.2 04/23/2014 0210   PROT 6.8 04/20/2009 1744   ALBUMIN 4.1 04/20/2009 1744   AST 60* 04/20/2009 1744   ALT 27 04/20/2009 1744   ALKPHOS 101 04/20/2009 1744   BILITOT 0.9 04/20/2009 1744   GFRNONAA 73* 04/23/2014 0210   GFRAA 84* 04/23/2014 0210    No results found for this basename: LIPASE, AMYLASE,  in the last 168 hours No results found for this basename: AMMONIA,  in the last 168 hours   Recent Labs Lab 04/23/14 0213  TROPONINI <0.30   BNP (last 3 results)  Recent Labs  10/05/13 1618 04/23/14 0213  PROBNP 3469.0* 12261.0*    Radiological Exams on Admission: Dg Chest 2 View  04/23/2014   CLINICAL DATA:  Shortness of breath for 1 week  EXAM: CHEST  2 VIEW  COMPARISON:  04/20/2009  FINDINGS: There is cardiomegaly which is likely progressed from previous. Chronic aortic tortuosity. Aortic annular calcification noted in the lateral projection. New diffuse interstitial abnormality with Kerley B-lines. There is cephalized pulmonary blood flow. Some of the opacities appears somewhat coarse, especially at the bases. No evidence of effusion or pneumothorax.  Bilateral glenohumeral osteoarthritis with loose bodies.  IMPRESSION: 1. CHF. 2. Question background chronic lung disease/fibrosis, which would be new from 2010 comparison.   Electronically Signed   By:  Jorje Guild M.D.   On: 04/23/2014 03:10   EKG: Independently reviewed. normal sinus rhythm, nonspecific ST and T waves changes, left axis deviation.  Assessment/Plan Principal Problem:   Acute on chronic combined systolic and diastolic congestive heart failure Active Problems:   Coronary artery disease   Ischemic dilated cardiomyopathy   Hypertension   AAA (abdominal aortic aneurysm)   1. Acute on chronic combined systolic and diastolic congestive heart failure Patient presents with complaints of shortness of breath with orthopnea and PND symptoms. She is found to have significantly elevated proBNP. She has a history of cardiac myopathy and coronary artery disease. At present troponins and EKG does not show any acute ischemia. We'll continue to follow serial troponins, telemetry. We will also obtain limited echocardiogram in the morning. I will give  her one dose of IV Lasix 40x1.  2. Hypertension Patient has at present borderline hypotension with blood pressure and high 90s to 100. Therefore I would hold of both Coreg and lisinopril, which can be resumed once her blood pressure is acceptable.  3. Coronary artery disease Continue aspirin  4. Bilateral leg swelling. A lower extremity Doppler.   Consults: Cardiology was consulted by ED  DVT Prophylaxis: subcutaneous Heparin Nutrition: Cardiac diet  Code Status: Full, she says "it is for my family"and "she does not want to discuss with her family as she does not want her family to think her condition is that serious". I recommend to discuss with the family in the morning to further decide on CODE STATUS.  Disposition: Admitted to inpatient in telemetry unit.  Author: Berle Mull, MD [facility] Pager: [phone] 04/23/2014, 5:51 AM    If 7PM-7AM, please contact night-coverage [email or web address] Password [email or web address]  **Disclaimer: This note may have been dictated with voice recognition software. Similar sounding words can  inadvertently be transcribed and this note may contain transcription errors which may not have been corrected upon publication of note.**

## 2014-04-23 NOTE — ED Notes (Signed)
Pt. reports SOB worse when lying for several days with lower leg edema , denies chest , history of CHF , CAD , coronary stent, her cardiologist is Dr. Scarlette Calico .

## 2014-04-24 DIAGNOSIS — R9389 Abnormal findings on diagnostic imaging of other specified body structures: Secondary | ICD-10-CM

## 2014-04-24 LAB — CBC
HCT: 39.9 % (ref 36.0–46.0)
Hemoglobin: 12.8 g/dL (ref 12.0–15.0)
MCH: 27.7 pg (ref 26.0–34.0)
MCHC: 32.1 g/dL (ref 30.0–36.0)
MCV: 86.4 fL (ref 78.0–100.0)
Platelets: 264 10*3/uL (ref 150–400)
RBC: 4.62 MIL/uL (ref 3.87–5.11)
RDW: 14.5 % (ref 11.5–15.5)
WBC: 8.3 10*3/uL (ref 4.0–10.5)

## 2014-04-24 LAB — COMPREHENSIVE METABOLIC PANEL
ALT: 12 U/L (ref 0–35)
AST: 16 U/L (ref 0–37)
Albumin: 3.2 g/dL — ABNORMAL LOW (ref 3.5–5.2)
Alkaline Phosphatase: 71 U/L (ref 39–117)
BUN: 15 mg/dL (ref 6–23)
CO2: 27 mEq/L (ref 19–32)
Calcium: 8.7 mg/dL (ref 8.4–10.5)
Chloride: 102 mEq/L (ref 96–112)
Creatinine, Ser: 0.67 mg/dL (ref 0.50–1.10)
GFR calc Af Amer: 88 mL/min — ABNORMAL LOW (ref >90)
GFR calc non Af Amer: 76 mL/min — ABNORMAL LOW (ref >90)
Glucose, Bld: 86 mg/dL (ref 70–99)
Potassium: 3.6 mEq/L — ABNORMAL LOW (ref 3.7–5.3)
Sodium: 142 mEq/L (ref 137–147)
Total Bilirubin: 0.7 mg/dL (ref 0.3–1.2)
Total Protein: 5.7 g/dL — ABNORMAL LOW (ref 6.0–8.3)

## 2014-04-24 LAB — PROTIME-INR
INR: 1.14 (ref 0.00–1.49)
Prothrombin Time: 14.6 seconds (ref 11.6–15.2)

## 2014-04-24 MED ORDER — METOPROLOL SUCCINATE ER 25 MG PO TB24
25.0000 mg | ORAL_TABLET | Freq: Every day | ORAL | Status: DC
  Administered 2014-04-24 – 2014-04-25 (×2): 25 mg via ORAL
  Filled 2014-04-24 (×2): qty 1

## 2014-04-24 MED ORDER — FUROSEMIDE 40 MG PO TABS
40.0000 mg | ORAL_TABLET | Freq: Every day | ORAL | Status: DC
  Administered 2014-04-24: 40 mg via ORAL
  Filled 2014-04-24 (×2): qty 1

## 2014-04-24 NOTE — Progress Notes (Signed)
Assessment/Plan: Principal Problem:   Acute on chronic combined systolic and diastolic congestive heart failure - doing a little better. Tolerated lisinopril. Will start metoprolol succinate and if BP is okay, resume furosemide later today. We discussed her being referred to Dr. Sung Amabile in the CHF clinic.  Active Problems:   Coronary artery disease   Ischemic dilated cardiomyopathy   Hypertension   AAA (abdominal aortic aneurysm)   Abnormal chest x-ray - question of some pulm fibrosis in bases and her rales are dry. I discussed this as possibly some of the reason for her DOE but would not explain PND.    Subjective: She did okay overnight. Had a little trouble with laying flat. Was not out of bed yesterday (she said she was; son said she was not and he was here all day)  Objective:  Vital Signs: Filed Vitals:   04/23/14 0707 04/23/14 1421 04/24/14 0142 04/24/14 0435  BP: 115/81 91/59 103/69 105/73  Pulse: 82 84 77 76  Temp: 97.7 F (36.5 C) 98.3 F (36.8 C) 97.9 F (36.6 C) 97.6 F (36.4 C)  TempSrc: Oral Oral Oral Oral  Resp: 17 18 17 18   Height: 4\' 11"  (1.499 m)     Weight: 44.09 kg (97 lb 3.2 oz)   43.455 kg (95 lb 12.8 oz)  SpO2: 95% 96% 92% 93%     EXAM: dry rales at bases.    Intake/Output Summary (Last 24 hours) at 04/24/14 0927 Last data filed at 04/24/14 0917  Gross per 24 hour  Intake    600 ml  Output      0 ml  Net    600 ml    Lab Results:  Recent Labs  04/23/14 0210 04/24/14 0445  NA 142 142  K 4.6 3.6*  CL 101 102  CO2 28 27  GLUCOSE 113* 86  BUN 20 15  CREATININE 0.78 0.67  CALCIUM 9.2 8.7    Recent Labs  04/24/14 0445  AST 16  ALT 12  ALKPHOS 71  BILITOT 0.7  PROT 5.7*  ALBUMIN 3.2*   No results found for this basename: LIPASE, AMYLASE,  in the last 72 hours  Recent Labs  04/23/14 0210 04/24/14 0445  WBC 7.0 8.3  HGB 13.4 12.8  HCT 42.4 39.9  MCV 88.5 86.4  PLT 288 264    Recent Labs  04/23/14 0600 04/23/14 1153  04/23/14 1815  TROPONINI <0.30 <0.30 <0.30   BNP    Component Value Date/Time   PROBNP 12261.0* 04/23/2014 0213   No results found for this basename: DDIMER,  in the last 72 hours No results found for this basename: HGBA1C,  in the last 72 hours No results found for this basename: CHOL, HDL, LDLCALC, TRIG, CHOLHDL, LDLDIRECT,  in the last 72 hours  Recent Labs  04/23/14 0600  TSH 0.654   No results found for this basename: VITAMINB12, FOLATE, FERRITIN, TIBC, IRON, RETICCTPCT,  in the last 72 hours  Studies/Results: Dg Chest 2 View  04/23/2014   CLINICAL DATA:  Shortness of breath for 1 week  EXAM: CHEST  2 VIEW  COMPARISON:  04/20/2009  FINDINGS: There is cardiomegaly which is likely progressed from previous. Chronic aortic tortuosity. Aortic annular calcification noted in the lateral projection. New diffuse interstitial abnormality with Kerley B-lines. There is cephalized pulmonary blood flow. Some of the opacities appears somewhat coarse, especially at the bases. No evidence of effusion or pneumothorax.  Bilateral glenohumeral osteoarthritis with loose bodies.  IMPRESSION: 1. CHF. 2.  Question background chronic lung disease/fibrosis, which would be new from 2010 comparison.   Electronically Signed   By: Jorje Guild M.D.   On: 04/23/2014 03:10   Medications: Medications administered in the last 24 hours reviewed.  Current Medication List reviewed.    LOS: 1 day   [facility] Internal Medicine @ [facility] [phone]) 04/24/2014, 9:27 AM

## 2014-04-25 ENCOUNTER — Telehealth: Payer: Self-pay | Admitting: Interventional Cardiology

## 2014-04-25 DIAGNOSIS — I509 Heart failure, unspecified: Secondary | ICD-10-CM | POA: Diagnosis not present

## 2014-04-25 DIAGNOSIS — I519 Heart disease, unspecified: Secondary | ICD-10-CM | POA: Diagnosis not present

## 2014-04-25 LAB — BASIC METABOLIC PANEL
BUN: 16 mg/dL (ref 6–23)
CO2: 27 mEq/L (ref 19–32)
Calcium: 8.7 mg/dL (ref 8.4–10.5)
Chloride: 102 mEq/L (ref 96–112)
Creatinine, Ser: 0.67 mg/dL (ref 0.50–1.10)
GFR calc Af Amer: 88 mL/min — ABNORMAL LOW (ref >90)
GFR calc non Af Amer: 76 mL/min — ABNORMAL LOW (ref >90)
Glucose, Bld: 97 mg/dL (ref 70–99)
Potassium: 3.8 mEq/L (ref 3.7–5.3)
Sodium: 142 mEq/L (ref 137–147)

## 2014-04-25 MED ORDER — METOPROLOL SUCCINATE ER 25 MG PO TB24: 25.0000 mg | ORAL_TABLET | Freq: Every day | ORAL | Status: DC

## 2014-04-25 MED ORDER — LISINOPRIL 5 MG PO TABS: 5.0000 mg | ORAL_TABLET | Freq: Every day | ORAL | Status: DC

## 2014-04-25 MED ORDER — POTASSIUM CHLORIDE CRYS ER 10 MEQ PO TBCR: 10.0000 meq | EXTENDED_RELEASE_TABLET | Freq: Every day | ORAL | Status: DC | PRN

## 2014-04-25 MED ORDER — FUROSEMIDE 40 MG PO TABS: 40.0000 mg | ORAL_TABLET | Freq: Every day | ORAL | Status: DC

## 2014-04-25 NOTE — Progress Notes (Signed)
Son in room while completing pt assessment. Son yells at mother due to her having difficulties explaining things. Pt gets upset and very frustrated because she feels that the son is trying to control her and tell her what to do.

## 2014-04-25 NOTE — Progress Notes (Signed)
Orders to discharge home. Completed discharge teaching with pt and son. Scripts were given to pt from doctor for new medication. Patient refused home health nurse. Transported via wheelchair to 2nd floor for discharge.

## 2014-04-25 NOTE — Progress Notes (Signed)
Chart review complete.  Patient is not eligible for [facility] Care Management services because his/her PCP is not a [facility] primary care provider or is not [facility] affiliated.  For any additional questions or new referrals please contact Tim Henderson BSN RN [facility] Liaison at [phone] °

## 2014-04-25 NOTE — Discharge Instructions (Signed)
Weigh your self daily, call me if weight goes up more than 3 lbs.

## 2014-04-25 NOTE — Telephone Encounter (Signed)
New problem ° ° ° °Pt having SOB.. °

## 2014-04-25 NOTE — Care Management Note (Signed)
    Page 1 of 1   04/25/2014     1:57:00 PM CARE MANAGEMENT NOTE 04/25/2014  Patient:  CAMBRIELLE, HAERTEL   Account Number:  [id number]  Date Initiated:  04/25/2014  Documentation initiated by:  [facility]  Subjective/Objective Assessment:   80 yo female with complaints of SOB with orthopnea and PND symptoms. She is found to have significantly elevated proBNP. She has a history of cardiac myopathy and CAD.// Home with spouse.     Action/Plan:   IV lasix, ECG, serial troponins.  //Access for [facility] services.   Anticipated DC Date:  04/25/2014   Anticipated DC Plan:  [place]  CM consult      [facility] Choice  NA   Choice offered to / List presented to:  C-1 Patient        [place] arranged  [place] - 11 Patient Refused      Status of service:  Completed, signed off Medicare Important Message given?  NA - LOS <3 / Initial given by admissions (If response is "NO", the following Medicare IM given date fields will be blank) Date Medicare IM given:   Date Additional Medicare IM given:    Discharge Disposition:  HOME/SELF CARE  Per UR Regulation:  Reviewed for med. necessity/level of care/duration of stay  If discussed at [place] of Stay Meetings, dates discussed:    Comments:  04/25/14 1145 Camellia J. Clydene Laming, RN, BSN, [employer] [phone] Spoke with pt and son, [name] [phone]) regarding discharge planning.  I have recommended that this patient have [place] but she declines at this time. I have discussed the risks and benefits of this service with her. The patient verbalizes understanding.

## 2014-04-25 NOTE — Telephone Encounter (Signed)
Calling stating she was d/c from hospital this AM for SOB. States since she has been home has started having SOB again.  Before I could any further information from her she ended the conversation saying "I have to call you back" and she hung up.

## 2014-04-26 ENCOUNTER — Telehealth: Payer: Self-pay | Admitting: Interventional Cardiology

## 2014-04-26 DIAGNOSIS — Z79899 Other long term (current) drug therapy: Secondary | ICD-10-CM

## 2014-04-26 DIAGNOSIS — I509 Heart failure, unspecified: Secondary | ICD-10-CM

## 2014-04-26 NOTE — Telephone Encounter (Signed)
New problem   Pt having leg and ankle swelling. Please call pt.

## 2014-04-26 NOTE — Discharge Summary (Signed)
Physician Discharge Summary  Patient ID: Jacqueline Reilly MRN: [id number] DOB/AGE: 07-21-1924 80 y.o.  Admit date: 04/23/2014 Discharge date: 04/26/2014  Admission Diagnoses: Acute on chronic systolic congestive heart failure Coronary artery disease Dilated cardiomyopathy Hypertension Abdominal aortic aneurysm Hyperlipidemia  Discharge Diagnoses:  Principal Problem:   Acute on chronic systolic congestive heart failure Active Problems: Coronary artery disease   Dilated cardiomyopathy   Hypertension   AAA (abdominal aortic aneurysm) Hyperlipidemia   Abnormal chest x-ray   Discharged Condition: good  Hospital Course: The patient was admitted on June 27 complaining of shortness of breath over one week prior to admission worse with lying down. She been placed in an extra pill of Lasix. She minutes and not taking Coreg on a regular basis, may have been taking lisinopril. Chest x-ray showed congestive heart failure and possible background lung disease/fibrosis new from 2010. EKG showed nonspecific ST-T wave changes and left axis deviation. BNP was 12,261. The patient was made with acute on chronic systolic congestive heart failure. Cardiac enzymes were negative. She was given IV Lasix. Lower extremity Doppler was negative for DVT. She was switched to oral Lasix and diuresed well. She was restarted on low-dose lisinopril and metoprolol was started. By discharge her shortness of breath was much improved. Her chest x-ray did question some pulmonary fibrosis and she may need further workup as an outpatient. Dr. Maxwell Caul had a long discussion to her son regarding her medical situation and the importance of compliance. We discussed referring her to the congestive heart failure clinic as an outpatient.  Consults: cardiology  Significant Diagnostic Studies: labs: At discharge sodium 142 potassium 2.8, bicarbonate 27, chloride 102, BUN 16, creatinine 0.67, WBC 8.3, hemoglobin 12.8, platelet 264 radiology:  CXR: Congestive heart failure and possible pulmonary fibrosis and venous Doppler ultrasound negative DVT  Treatments: cardiac meds: lisinopril (Prinivil), metoprolol and furosemide  Discharge Exam: Blood pressure 105/74, pulse 75, temperature 97.3 F (36.3 C), temperature source Oral, resp. rate 18, height 4\' 11"  (1.499 m), weight 43.4 kg (95 lb 10.9 oz), SpO2 98.00%. General appearance: alert and cooperative Resp: clear to auscultation bilaterally Cardio: regular rate and rhythm, S1, S2 normal, no murmur, click, rub or gallop Extremities: extremities normal, atraumatic, no cyanosis or edema  Disposition: 01-Home or Self Care     Medication List    STOP taking these medications       atorvastatin 40 MG tablet  Commonly known as:  LIPITOR     carvedilol 3.125 MG tablet  Commonly known as:  COREG      TAKE these medications       aspirin 81 MG chewable tablet  Chew 81 mg by mouth daily.     diphenhydramine-acetaminophen 25-500 MG Tabs  Commonly known as:  TYLENOL PM  Take 1 tablet by mouth at bedtime as needed.     furosemide 40 MG tablet  Commonly known as:  LASIX  Take 40 mg by mouth.     furosemide 40 MG tablet  Commonly known as:  LASIX  Take 1 tablet (40 mg total) by mouth daily.     lisinopril 5 MG tablet  Commonly known as:  PRINIVIL,ZESTRIL  Take 1 tablet (5 mg total) by mouth daily.     lisinopril 5 MG tablet  Commonly known as:  PRINIVIL,ZESTRIL  Take 5 mg by mouth daily.     metoprolol succinate 25 MG 24 hr tablet  Commonly known as:  TOPROL-XL  Take 1 tablet (25 mg total) by mouth daily.  nitroGLYCERIN 0.4 MG SL tablet  Commonly known as:  NITROSTAT  Place 0.4 mg under the tongue every 5 (five) minutes as needed for chest pain.     potassium chloride 10 MEQ tablet  Commonly known as:  K-DUR,KLOR-CON  Take 1 tablet (10 mEq total) by mouth daily as needed (potassium levels).           Follow-up Information   Follow up with Irven Shelling, MD On 05/02/2014. (@2 :98 spoke with Joellen Jersey )    Specialty:  Internal Medicine   Contact information:   [address], [address] [place] [address] [phone]       Signed: Irven Shelling 04/26/2014, 6:33 AM

## 2014-04-26 NOTE — Telephone Encounter (Signed)
Dr. Irish Lack, it looks as though pt was d/c from hospital this morning. Please advise

## 2014-04-27 MED ORDER — FUROSEMIDE 40 MG PO TABS: 40.0000 mg | ORAL_TABLET | Freq: Three times a day (TID) | ORAL | Status: DC

## 2014-04-27 NOTE — Telephone Encounter (Signed)
Referral sent. Bmet ordered.

## 2014-04-27 NOTE — Telephone Encounter (Signed)
OK.  She should have a BMet tomorrow.

## 2014-04-27 NOTE — Telephone Encounter (Signed)
Pt.notified

## 2014-04-27 NOTE — Telephone Encounter (Addendum)
Spoke with pt and she states she still has LE Edema and SOB that is slight during the day but is worse at night when she is trying to sleep. Over the last two days has taken lasix 40 mg TID on her own. I spoke with pts son Jacqueline Reilly who will be in town over the next week and he confirmed that she is indeed taking lasix 40 mg TID. Pt still continues to have symptoms with this increased dosage. Pts son tells me that she was non-compliant with most of her medications before she was admitted to the hospital. He thinks that his mother is in the process of being referred to Dr. Jeffie Pollock at the [facility]. Should we try and expedite this referral? Are you okay with pt taking lasix 40 mg TID?

## 2014-04-28 ENCOUNTER — Ambulatory Visit (HOSPITAL_COMMUNITY)
Admission: RE | Admit: 2014-04-28 | Discharge: 2014-04-28 | Disposition: A | Discharge status: Home / Self Care | Payer: Medicare Other | Source: Ambulatory Visit | Attending: Internal Medicine | Admitting: Internal Medicine

## 2014-04-28 ENCOUNTER — Other Ambulatory Visit (INDEPENDENT_AMBULATORY_CARE_PROVIDER_SITE_OTHER): Payer: Medicare Other

## 2014-04-28 VITALS — BP 94/58 | HR 70 | Wt 98.8 lb

## 2014-04-28 DIAGNOSIS — I509 Heart failure, unspecified: Secondary | ICD-10-CM

## 2014-04-28 DIAGNOSIS — I5042 Chronic combined systolic (congestive) and diastolic (congestive) heart failure: Secondary | ICD-10-CM | POA: Insufficient documentation

## 2014-04-28 DIAGNOSIS — I5022 Chronic systolic (congestive) heart failure: Secondary | ICD-10-CM | POA: Diagnosis not present

## 2014-04-28 DIAGNOSIS — I5030 Unspecified diastolic (congestive) heart failure: Secondary | ICD-10-CM

## 2014-04-28 DIAGNOSIS — I25119 Atherosclerotic heart disease of native coronary artery with unspecified angina pectoris: Secondary | ICD-10-CM

## 2014-04-28 DIAGNOSIS — I2510 Atherosclerotic heart disease of native coronary artery without angina pectoris: Secondary | ICD-10-CM | POA: Diagnosis not present

## 2014-04-28 DIAGNOSIS — I209 Angina pectoris, unspecified: Secondary | ICD-10-CM | POA: Diagnosis not present

## 2014-04-28 DIAGNOSIS — Z79899 Other long term (current) drug therapy: Secondary | ICD-10-CM

## 2014-04-28 LAB — BASIC METABOLIC PANEL
Anion gap: 18 — ABNORMAL HIGH (ref 5–15)
BUN: 28 mg/dL — ABNORMAL HIGH (ref 6–23)
BUN: 29 mg/dL — ABNORMAL HIGH (ref 6–23)
CO2: 27 mEq/L (ref 19–32)
CO2: 26 mEq/L (ref 19–32)
Calcium: 9.3 mg/dL (ref 8.4–10.5)
Calcium: 9.5 mg/dL (ref 8.4–10.5)
Chloride: 101 mEq/L (ref 96–112)
Chloride: 95 mEq/L — ABNORMAL LOW (ref 96–112)
Creatinine, Ser: 1.1 mg/dL (ref 0.4–1.2)
Creatinine, Ser: 0.94 mg/dL (ref 0.50–1.10)
GFR calc Af Amer: 61 mL/min — ABNORMAL LOW (ref >90)
GFR calc non Af Amer: 53 mL/min — ABNORMAL LOW (ref >90)
GFR: 52.53 mL/min — ABNORMAL LOW (ref >60.00)
Glucose, Bld: 109 mg/dL — ABNORMAL HIGH (ref 70–99)
Glucose, Bld: 93 mg/dL (ref 70–99)
Potassium: 4.5 mEq/L (ref 3.5–5.1)
Potassium: 4.7 mEq/L (ref 3.7–5.3)
Sodium: 139 mEq/L (ref 135–145)
Sodium: 139 mEq/L (ref 137–147)

## 2014-04-28 LAB — PRO B NATRIURETIC PEPTIDE: Pro B Natriuretic peptide (BNP): 15377 pg/mL — ABNORMAL HIGH (ref 0–450)

## 2014-04-28 MED ORDER — POTASSIUM CHLORIDE CRYS ER 20 MEQ PO TBCR: 20.0000 meq | EXTENDED_RELEASE_TABLET | Freq: Two times a day (BID) | ORAL | Status: DC

## 2014-04-28 MED ORDER — TORSEMIDE 20 MG PO TABS: 20.0000 mg | ORAL_TABLET | Freq: Two times a day (BID) | ORAL | Status: DC

## 2014-04-28 NOTE — Progress Notes (Signed)
ADVANCED HEART FAILURE CLINIC NOTE   Patient ID: Jacqueline Reilly, female   DOB: 1924/05/20, 80 y.o.   MRN: [id number]   Primary Care: Electa Sniff Primary Cardiologist: Irish Lack  HPI:  Jacqueline Reilly) is an 80 y/o woman with CAD, AAA, systolic HF EF 123XX123 and possible dementia referred for further evaluation of her HF.  She has a h/o CAD and has had two previous coronary interventions including a cutting balloon to her D2 in 2007 and a DES to her mid LCX in 2010. Her LV dysfunction has been out of proportion to her CAD. EF measured 20-25%.   She lives with her husband - though their relationship seems very strained based on their interaction in clinic after her visit (he was not allowed in the room). Her brother and sone were present . Tells me up to last few months she has been quite independent with all activities. However since that time has noted marked DOE and fatigue. She had Myoview in 12/14 (non-gated) with ischemia in inferior wall but refused cath.   Does not weigh at home. + edema/orthopnea/PND. Refused HHRN. Has had problems with noncompliance with meds in past.    Admitted 6/27-6/30 with HF and diuresed from 97 to 95 pounds. Felt better transiently but SOB getting worse again. Also some CP with exertion. Was sent home on lasix 40 daily but last 2 days shes been getting 40 tid.   Last cath was in 2010: LM ok LAD moderate plaque - IVUS ok LCX 70% mid -> DES placed RCA ok EF 30-35% ? 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    Review of Systems: [y] = yes, [ ]  = no   General: Weight gain [ ] ; Weight loss [ ] ; Anorexia [ ] ; Fatigue [ ] ; Fever [ ] ; Chills [ ] ; Weakness [ ]   Cardiac: Chest pain/pressure [ ] ; Resting SOB [ ] ; Exertional SOB [ ] ; Orthopnea [ ] ; Pedal Edema [ ] ; Palpitations [ ] ; Syncope [ ] ; Presyncope [ ] ; Paroxysmal nocturnal dyspnea[ ]   Pulmonary: Cough [ ] ; Wheezing[ ] ; Hemoptysis[ ] ; Sputum [  ]; Snoring [ ]   GI: Vomiting[ ] ; Dysphagia[ ] ; Melena[ ] ; Hematochezia [ ] ; Heartburn[ ] ; Abdominal pain [ ] ; Constipation [ ] ; Diarrhea [ ] ; BRBPR [ ]   GU: Hematuria[ ] ; Dysuria [ ] ; Nocturia[ ]   Vascular: Pain in legs with walking [ ] ; Pain in feet with lying flat [ ] ; Non-healing sores [ ] ; Stroke [ ] ; TIA [ ] ; Slurred speech [ ] ;  Neuro: Headaches[ ] ; Vertigo[ ] ; Seizures[ ] ; Paresthesias[ ] ;Blurred vision [ ] ; Diplopia [ ] ; Vision changes [ ]   Ortho/Skin: Arthritis [ ] ; Joint pain [ ] ; Muscle pain [ ] ; Joint swelling [ ] ; Back Pain [ ] ; Rash [ ]   Psych: Depression[ ] ; Anxiety[ ]   Heme: Bleeding problems [ ] ; Clotting disorders [ ] ; Anemia [ ]   Endocrine: Diabetes [ ] ; Thyroid dysfunction[ ]    Past Medical History  Diagnosis Date  . PVD (peripheral vascular disease)   . Ventricular dysfunction     left; ischemic  . Hyperlipidemia   . AAA (abdominal aortic aneurysm)   . Renal artery stenosis   . Hypertension   . Coronary artery disease 2007    moderate ASCAD of the left system s/p PCI of the D2 and PCI of the left circ 03/2009  . CHF (congestive heart failure)     Current  Outpatient Prescriptions  Medication Sig Dispense Refill  . acetaminophen (TYLENOL) 500 MG tablet Take 500 mg by mouth every 6 (six) hours as needed.      Marland Kitchen aspirin 81 MG chewable tablet Chew 81 mg by mouth daily.      . furosemide (LASIX) 40 MG tablet Take 1 tablet (40 mg total) by mouth 3 (three) times daily.  30 tablet  11  . lisinopril (PRINIVIL,ZESTRIL) 5 MG tablet Take 5 mg by mouth daily.      . metoprolol succinate (TOPROL-XL) 25 MG 24 hr tablet Take 1 tablet (25 mg total) by mouth daily.  30 tablet  11  . nitroGLYCERIN (NITROSTAT) 0.4 MG SL tablet Place 0.4 mg under the tongue every 5 (five) minutes as needed for chest pain.      . potassium chloride SA (K-DUR,KLOR-CON) 20 MEQ tablet Take 20 mEq by mouth daily.       No current facility-administered medications for this encounter.    Allergies    Allergen Reactions  . Codeine Nausea Only  . Garlic Nausea Only    History   Social History  . Marital Status: Married    Spouse Name: N/A    Number of Children: 1  . Years of Education: N/A   Occupational History  . retired    Social History Main Topics  . Smoking status: Never Smoker   . Smokeless tobacco: Never Used  . Alcohol Use: Yes     Comment: occasionally  . Drug Use: No  . Sexual Activity: Not on file   Other Topics Concern  . Not on file   Social History Narrative  . No narrative on file    Family History  Problem Relation Age of Onset  . Heart disease Mother   . Heart disease Father   . Heart disease Brother     x 3     Filed Vitals:   04/28/14 0947  BP: 94/58  Pulse: 70  Weight: 98 lb 12 oz (44.793 kg)  SpO2: 94%    PHYSICAL EXAM: General:  Elderly frail appearing. No respiratory difficulty HEENT: normal Neck: supple. JVDP 10 Carotids 2+ bilat; no bruits. No lymphadenopathy or thryomegaly appreciated. Cor: PMI nondisplaced. Regular rate & rhythm. No rubs, gallops or murmurs. Occasional ectopy Lungs: clear Abdomen: soft, nontender, nondistended. No hepatosplenomegaly. No bruits or masses. Good bowel sounds. Extremities: no cyanosis, clubbing, rash, 1+ edema Neuro: alert & oriented x 3, cranial nerves grossly intact. moves all 4 extremities w/o difficulty. Affect pleasant.  ECG: SR 87. With PACs iLBBB   ASSESSMENT & PLAN: 1. Chronic systolic HF -NYHA III. Though it is hard to know what component of her symptoms/deterioration is from HF and how much of this is part of the natural senescence process.  - She remains volume overloaded despite increasing her lasix. Change to demadex 20 bid with kcl 20 bid - Discussed importance of compliance with her meds, daily weights, fluid restriction and sliding-scale diuretic protocol. - Have arranged for [facility] and HHPT despite her initial objections - Check labs today  2. CAD - I reviewed  Myoview personally. There is some ischemia there but not sure if she is really having angina - though she may be - If still symptomatic after diuresis may need R/L cath.   Return in 1 week.   Total time spent 50 minutes. Over half that time spent discussing above.   Daniel Bensimhon,MD 2:44 PM

## 2014-04-28 NOTE — Patient Instructions (Signed)
Stop Furosemide (lasix)  Start Torsemide 20 mg Twice daily  Increase Potassium to 20 meq Twice daily   Labs today  You have been referred to [place], they will contact you to schedule appointment  Your physician recommends that you schedule a follow-up appointment in: 1 week

## 2014-05-02 ENCOUNTER — Telehealth: Payer: Self-pay | Admitting: Cardiology

## 2014-05-02 DIAGNOSIS — I509 Heart failure, unspecified: Secondary | ICD-10-CM | POA: Diagnosis not present

## 2014-05-02 DIAGNOSIS — I714 Abdominal aortic aneurysm, without rupture, unspecified: Secondary | ICD-10-CM | POA: Diagnosis not present

## 2014-05-02 DIAGNOSIS — I5043 Acute on chronic combined systolic (congestive) and diastolic (congestive) heart failure: Secondary | ICD-10-CM | POA: Diagnosis not present

## 2014-05-02 DIAGNOSIS — I10 Essential (primary) hypertension: Secondary | ICD-10-CM | POA: Diagnosis not present

## 2014-05-02 DIAGNOSIS — I2510 Atherosclerotic heart disease of native coronary artery without angina pectoris: Secondary | ICD-10-CM | POA: Diagnosis not present

## 2014-05-02 NOTE — Telephone Encounter (Signed)
Message copied by Ulla Potash H on Mon May 02, 2014  8:16 AM ------      Message from: Jettie Booze      Created: Thu Apr 28, 2014  5:36 PM       Decrease lasix to 40 mg BID ------

## 2014-05-02 NOTE — Telephone Encounter (Signed)
Per Dr. Irish Lack no med change as pt was changed to [facility] on 04/28/14 by Dr. Haroldine Laws.

## 2014-05-04 DIAGNOSIS — I5043 Acute on chronic combined systolic (congestive) and diastolic (congestive) heart failure: Secondary | ICD-10-CM | POA: Diagnosis not present

## 2014-05-04 DIAGNOSIS — I10 Essential (primary) hypertension: Secondary | ICD-10-CM | POA: Diagnosis not present

## 2014-05-04 DIAGNOSIS — I714 Abdominal aortic aneurysm, without rupture, unspecified: Secondary | ICD-10-CM | POA: Diagnosis not present

## 2014-05-04 DIAGNOSIS — I509 Heart failure, unspecified: Secondary | ICD-10-CM | POA: Diagnosis not present

## 2014-05-04 DIAGNOSIS — I2510 Atherosclerotic heart disease of native coronary artery without angina pectoris: Secondary | ICD-10-CM | POA: Diagnosis not present

## 2014-05-05 ENCOUNTER — Ambulatory Visit (HOSPITAL_BASED_OUTPATIENT_CLINIC_OR_DEPARTMENT_OTHER)
Admission: RE | Admit: 2014-05-05 | Discharge: 2014-05-05 | Disposition: A | Discharge status: Home / Self Care | Payer: Medicare Other | Source: Ambulatory Visit | Attending: Internal Medicine | Admitting: Internal Medicine

## 2014-05-05 VITALS — BP 102/68 | HR 82 | Wt 92.5 lb

## 2014-05-05 DIAGNOSIS — I739 Peripheral vascular disease, unspecified: Secondary | ICD-10-CM | POA: Insufficient documentation

## 2014-05-05 DIAGNOSIS — Z7982 Long term (current) use of aspirin: Secondary | ICD-10-CM | POA: Insufficient documentation

## 2014-05-05 DIAGNOSIS — J8410 Pulmonary fibrosis, unspecified: Secondary | ICD-10-CM | POA: Diagnosis not present

## 2014-05-05 DIAGNOSIS — I209 Angina pectoris, unspecified: Secondary | ICD-10-CM | POA: Diagnosis not present

## 2014-05-05 DIAGNOSIS — I10 Essential (primary) hypertension: Secondary | ICD-10-CM

## 2014-05-05 DIAGNOSIS — Z888 Allergy status to other drugs, medicaments and biological substances status: Secondary | ICD-10-CM

## 2014-05-05 DIAGNOSIS — I5022 Chronic systolic (congestive) heart failure: Secondary | ICD-10-CM

## 2014-05-05 DIAGNOSIS — I714 Abdominal aortic aneurysm, without rupture, unspecified: Secondary | ICD-10-CM | POA: Insufficient documentation

## 2014-05-05 DIAGNOSIS — R55 Syncope and collapse: Secondary | ICD-10-CM | POA: Diagnosis not present

## 2014-05-05 DIAGNOSIS — I509 Heart failure, unspecified: Secondary | ICD-10-CM

## 2014-05-05 DIAGNOSIS — I2510 Atherosclerotic heart disease of native coronary artery without angina pectoris: Secondary | ICD-10-CM

## 2014-05-05 DIAGNOSIS — I701 Atherosclerosis of renal artery: Secondary | ICD-10-CM

## 2014-05-05 DIAGNOSIS — I519 Heart disease, unspecified: Secondary | ICD-10-CM | POA: Insufficient documentation

## 2014-05-05 DIAGNOSIS — I25119 Atherosclerotic heart disease of native coronary artery with unspecified angina pectoris: Secondary | ICD-10-CM

## 2014-05-05 DIAGNOSIS — I4891 Unspecified atrial fibrillation: Secondary | ICD-10-CM | POA: Diagnosis not present

## 2014-05-05 DIAGNOSIS — I214 Non-ST elevation (NSTEMI) myocardial infarction: Secondary | ICD-10-CM | POA: Diagnosis not present

## 2014-05-05 DIAGNOSIS — I5043 Acute on chronic combined systolic (congestive) and diastolic (congestive) heart failure: Secondary | ICD-10-CM | POA: Diagnosis not present

## 2014-05-05 DIAGNOSIS — E785 Hyperlipidemia, unspecified: Secondary | ICD-10-CM | POA: Insufficient documentation

## 2014-05-05 LAB — BASIC METABOLIC PANEL
Anion gap: 15 (ref 5–15)
BUN: 21 mg/dL (ref 6–23)
CO2: 25 mEq/L (ref 19–32)
Calcium: 9.2 mg/dL (ref 8.4–10.5)
Chloride: 99 mEq/L (ref 96–112)
Creatinine, Ser: 0.76 mg/dL (ref 0.50–1.10)
GFR calc Af Amer: 85 mL/min — ABNORMAL LOW (ref >90)
GFR calc non Af Amer: 73 mL/min — ABNORMAL LOW (ref >90)
Glucose, Bld: 87 mg/dL (ref 70–99)
Potassium: 5 mEq/L (ref 3.7–5.3)
Sodium: 139 mEq/L (ref 137–147)

## 2014-05-05 NOTE — Progress Notes (Signed)
Patient ID: [name], female   DOB: 01/20/1924, 80 y.o.   MRN: [id number]   ADVANCED HEART FAILURE CLINIC NOTE   Patient ID: CORDILIA RHEAULT, female   DOB: May 04, 1924, 80 y.o.   MRN: [id number]   Primary Care: [facility] Sniff Primary Cardiologist: Irish Lack  HPI: Ms. Hardimon Margarita Grizzle) is an 80 y/o woman with CAD, AAA, systolic HF EF 123XX123 and possible dementia referred for further evaluation of her HF.  She has a h/o CAD and has had two previous coronary interventions including a cutting balloon to her D2 in 2007 and a DES to her mid LCX in 2010. Her LV dysfunction has been out of proportion to her CAD. EF measured 20-25%.   She lives with her husband - though their relationship seems very strained based on their interaction in clinic after her visit (he was not allowed in the room). Her brother and son were present . Tells me up to last few months she has been quite independent with all activities. However since that time has noted marked DOE and fatigue. She had Myoview in 12/14 (non-gated) with ischemia in inferior wall but refused cath.   Admitted 6/27-6/30 with HF and diuresed from 97 to 95 pounds. Felt better transiently but SOB getting worse again. Also some CP with exertion. Was sent home on lasix 40 daily but last 2 days shes been getting 40 tid.   She returns for follow up. Last visit lasix was changed to demadex 20 mg twice a day. Weight at home 91-92 pounds. Feeling much better.  Denies SOB/PND/Orthopnea/CP. Chest discomfort resolved once fluid was removed. Taking all medications. [facility] for HHRN HHPT.   Last cath was in 2010: LM ok LAD moderate plaque - IVUS ok LCX 70% mid -> DES placed RCA ok EF 30-35% ? 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   Labs 04/28/14 K 4.5 Creatinine 1.1      Past Medical History  Diagnosis Date  . PVD (peripheral vascular disease)   . Ventricular dysfunction     left; ischemic  .  Hyperlipidemia   . AAA (abdominal aortic aneurysm)   . Renal artery stenosis   . Hypertension   . Coronary artery disease 2007    moderate ASCAD of the left system s/p PCI of the D2 and PCI of the left circ 03/2009  . CHF (congestive heart failure)     Current Outpatient Prescriptions  Medication Sig Dispense Refill  . acetaminophen (TYLENOL) 500 MG tablet Take 500 mg by mouth every 6 (six) hours as needed.      Marland Kitchen aspirin 81 MG chewable tablet Chew 81 mg by mouth daily.      Marland Kitchen lisinopril (PRINIVIL,ZESTRIL) 5 MG tablet Take 5 mg by mouth daily.      . metoprolol succinate (TOPROL-XL) 25 MG 24 hr tablet Take 1 tablet (25 mg total) by mouth daily.  30 tablet  11  . Multiple Vitamin (MULTIVITAMIN) tablet Take 1 tablet by mouth daily.      . nitroGLYCERIN (NITROSTAT) 0.4 MG SL tablet Place 0.4 mg under the tongue every 5 (five) minutes as needed for chest pain.      . Omega-3 Fatty Acids (FISH OIL) 1200 MG CAPS Take 1 capsule by mouth daily.      . potassium chloride SA (K-DUR,KLOR-CON) 20 MEQ tablet Take 1 tablet (20 mEq total) by mouth 2 (two) times daily.  60 tablet  3  . torsemide (DEMADEX) 20 MG tablet Take 1 tablet (20 mg total) by mouth 2 (two) times daily.  60 tablet  3   No current facility-administered medications for this encounter.    Allergies  Allergen Reactions  . Codeine Nausea Only  . Garlic Nausea Only    History   Social History  . Marital Status: Married    Spouse Name: N/A    Number of Children: 1  . Years of Education: N/A   Occupational History  . retired    Social History Main Topics  . Smoking status: Never Smoker   . Smokeless tobacco: Never Used  . Alcohol Use: Yes     Comment: occasionally  . Drug Use: No  . Sexual Activity: Not on file   Other Topics Concern  . Not on file   Social History Narrative  . No narrative on file    Family History  Problem Relation Age of Onset  . Heart disease Mother   . Heart disease Father   . Heart  disease Brother     x 3     Filed Vitals:   05/05/14 0953  BP: 102/68  Pulse: 82  Weight: 92 lb 8 oz (41.958 kg)  SpO2: 96%    PHYSICAL EXAM: General:  Elderly frail appearing. No respiratory difficulty Husband and son present  HEENT: normal Neck: supple. JVD 6-7 Carotids 2+ bilat; no bruits. No lymphadenopathy or thryomegaly appreciated. Cor: PMI nondisplaced. Regular rate & rhythm. No rubs, gallops or murmurs. Occasional ectopy Lungs: clear Abdomen: soft, nontender, nondistended. No hepatosplenomegaly. No bruits or masses. Good bowel sounds. Extremities: no cyanosis, clubbing, rash, edema Neuro: alert & oriented x 3, cranial nerves grossly intact. moves all 4 extremities w/o difficulty. Affect pleasant.     ASSESSMENT & PLAN: 1. Chronic systolic HF -NYHA II-III. Marland Kitchen  - Volume status much improved. Keep weight 90-93 pounds. Hold torsemide if weight < 90 pounds. Take extra 20 mg torsemide if weight 94 pounds or greater.  Continue demadex 20 bid with kcl 20 bid - Discussed importance of compliance with her meds, daily weights, fluid restriction and sliding-scale diuretic protocol. -Continue HHRN and HHPT.  - Check labs today  2. CAD  Myoview-There is some ischemia but CP resolved with diuresis. Hold off on cath for now.    Follow up 3 weeks  CLEGG,AMY, NP-C 10:09 AM  Patient seen and examined with Darrick Grinder, NP. We discussed all aspects of the encounter. I agree with the assessment and plan as stated above.   Much improved with diuresis. Will continue current regimen. Reinforced need for daily weights and reviewed use of sliding scale diuretics. Goal weight 90-93 pounds. If weight 94 or greater take extra demadex. Continue HHPT. Consider enrolling in silver sneakers afterwards.   Chanice Brenton,MD 3:52 PM

## 2014-05-05 NOTE — Patient Instructions (Addendum)
Follow up in 3 weeks  Hold torsemide if your weight is less than 90 pounds.   Take an extra torsemide 20 mg if your weight is 94 pounds or greater.   Do the following things EVERYDAY: 1) Weigh yourself in the morning before breakfast. Write it down and keep it in a log. 2) Take your medicines as prescribed 3) Eat low salt foods-Limit salt (sodium) to 2000 mg per day.  4) Stay as active as you can everyday 5) Limit all fluids for the day to less than 2 liters

## 2014-05-06 DIAGNOSIS — I5043 Acute on chronic combined systolic (congestive) and diastolic (congestive) heart failure: Secondary | ICD-10-CM | POA: Diagnosis not present

## 2014-05-06 DIAGNOSIS — I714 Abdominal aortic aneurysm, without rupture, unspecified: Secondary | ICD-10-CM | POA: Diagnosis not present

## 2014-05-06 DIAGNOSIS — I509 Heart failure, unspecified: Secondary | ICD-10-CM | POA: Diagnosis not present

## 2014-05-06 DIAGNOSIS — I10 Essential (primary) hypertension: Secondary | ICD-10-CM | POA: Diagnosis not present

## 2014-05-06 DIAGNOSIS — I2510 Atherosclerotic heart disease of native coronary artery without angina pectoris: Secondary | ICD-10-CM | POA: Diagnosis not present

## 2014-05-07 ENCOUNTER — Inpatient Hospital Stay (HOSPITAL_COMMUNITY)
Admission: EM | Admit: 2014-05-07 | Discharge: 2014-05-20 | DRG: 280 | Disposition: A | Discharge status: Home / Self Care | Payer: Medicare Other | Attending: Cardiovascular Disease | Admitting: Cardiovascular Disease

## 2014-05-07 ENCOUNTER — Emergency Department (HOSPITAL_COMMUNITY): Payer: Medicare Other

## 2014-05-07 ENCOUNTER — Encounter (HOSPITAL_COMMUNITY): Payer: Self-pay | Admitting: Emergency Medicine

## 2014-05-07 DIAGNOSIS — I5189 Other ill-defined heart diseases: Secondary | ICD-10-CM

## 2014-05-07 DIAGNOSIS — I5023 Acute on chronic systolic (congestive) heart failure: Secondary | ICD-10-CM

## 2014-05-07 DIAGNOSIS — I420 Dilated cardiomyopathy: Secondary | ICD-10-CM

## 2014-05-07 DIAGNOSIS — I10 Essential (primary) hypertension: Secondary | ICD-10-CM | POA: Diagnosis present

## 2014-05-07 DIAGNOSIS — R609 Edema, unspecified: Secondary | ICD-10-CM | POA: Diagnosis not present

## 2014-05-07 DIAGNOSIS — Z91018 Allergy to other foods: Secondary | ICD-10-CM

## 2014-05-07 DIAGNOSIS — G9349 Other encephalopathy: Secondary | ICD-10-CM | POA: Diagnosis not present

## 2014-05-07 DIAGNOSIS — R4181 Age-related cognitive decline: Secondary | ICD-10-CM | POA: Diagnosis present

## 2014-05-07 DIAGNOSIS — Z681 Body mass index (BMI) 19 or less, adult: Secondary | ICD-10-CM

## 2014-05-07 DIAGNOSIS — E875 Hyperkalemia: Secondary | ICD-10-CM | POA: Diagnosis not present

## 2014-05-07 DIAGNOSIS — I214 Non-ST elevation (NSTEMI) myocardial infarction: Secondary | ICD-10-CM | POA: Diagnosis not present

## 2014-05-07 DIAGNOSIS — I714 Abdominal aortic aneurysm, without rupture, unspecified: Secondary | ICD-10-CM | POA: Diagnosis present

## 2014-05-07 DIAGNOSIS — D649 Anemia, unspecified: Secondary | ICD-10-CM | POA: Diagnosis not present

## 2014-05-07 DIAGNOSIS — I5043 Acute on chronic combined systolic (congestive) and diastolic (congestive) heart failure: Secondary | ICD-10-CM

## 2014-05-07 DIAGNOSIS — R5383 Other fatigue: Secondary | ICD-10-CM

## 2014-05-07 DIAGNOSIS — J9600 Acute respiratory failure, unspecified whether with hypoxia or hypercapnia: Secondary | ICD-10-CM | POA: Diagnosis not present

## 2014-05-07 DIAGNOSIS — D45 Polycythemia vera: Secondary | ICD-10-CM | POA: Diagnosis present

## 2014-05-07 DIAGNOSIS — R9431 Abnormal electrocardiogram [ECG] [EKG]: Secondary | ICD-10-CM

## 2014-05-07 DIAGNOSIS — E874 Mixed disorder of acid-base balance: Secondary | ICD-10-CM | POA: Diagnosis present

## 2014-05-07 DIAGNOSIS — I219 Acute myocardial infarction, unspecified: Secondary | ICD-10-CM | POA: Diagnosis not present

## 2014-05-07 DIAGNOSIS — E119 Type 2 diabetes mellitus without complications: Secondary | ICD-10-CM

## 2014-05-07 DIAGNOSIS — I472 Ventricular tachycardia, unspecified: Secondary | ICD-10-CM | POA: Diagnosis not present

## 2014-05-07 DIAGNOSIS — I701 Atherosclerosis of renal artery: Secondary | ICD-10-CM | POA: Diagnosis present

## 2014-05-07 DIAGNOSIS — Z79899 Other long term (current) drug therapy: Secondary | ICD-10-CM

## 2014-05-07 DIAGNOSIS — I739 Peripheral vascular disease, unspecified: Secondary | ICD-10-CM | POA: Diagnosis present

## 2014-05-07 DIAGNOSIS — R918 Other nonspecific abnormal finding of lung field: Secondary | ICD-10-CM | POA: Diagnosis not present

## 2014-05-07 DIAGNOSIS — I498 Other specified cardiac arrhythmias: Secondary | ICD-10-CM | POA: Diagnosis not present

## 2014-05-07 DIAGNOSIS — I2589 Other forms of chronic ischemic heart disease: Secondary | ICD-10-CM | POA: Diagnosis not present

## 2014-05-07 DIAGNOSIS — J189 Pneumonia, unspecified organism: Secondary | ICD-10-CM | POA: Diagnosis present

## 2014-05-07 DIAGNOSIS — R9389 Abnormal findings on diagnostic imaging of other specified body structures: Secondary | ICD-10-CM | POA: Diagnosis not present

## 2014-05-07 DIAGNOSIS — I509 Heart failure, unspecified: Secondary | ICD-10-CM | POA: Diagnosis not present

## 2014-05-07 DIAGNOSIS — E876 Hypokalemia: Secondary | ICD-10-CM | POA: Diagnosis not present

## 2014-05-07 DIAGNOSIS — Z66 Do not resuscitate: Secondary | ICD-10-CM | POA: Diagnosis not present

## 2014-05-07 DIAGNOSIS — I4729 Other ventricular tachycardia: Secondary | ICD-10-CM | POA: Diagnosis not present

## 2014-05-07 DIAGNOSIS — Z9861 Coronary angioplasty status: Secondary | ICD-10-CM

## 2014-05-07 DIAGNOSIS — J8410 Pulmonary fibrosis, unspecified: Secondary | ICD-10-CM | POA: Diagnosis not present

## 2014-05-07 DIAGNOSIS — Z8249 Family history of ischemic heart disease and other diseases of the circulatory system: Secondary | ICD-10-CM | POA: Diagnosis not present

## 2014-05-07 DIAGNOSIS — F411 Generalized anxiety disorder: Secondary | ICD-10-CM | POA: Diagnosis not present

## 2014-05-07 DIAGNOSIS — I519 Heart disease, unspecified: Secondary | ICD-10-CM

## 2014-05-07 DIAGNOSIS — R935 Abnormal findings on diagnostic imaging of other abdominal regions, including retroperitoneum: Secondary | ICD-10-CM | POA: Diagnosis not present

## 2014-05-07 DIAGNOSIS — F0390 Unspecified dementia without behavioral disturbance: Secondary | ICD-10-CM | POA: Diagnosis present

## 2014-05-07 DIAGNOSIS — R0602 Shortness of breath: Secondary | ICD-10-CM

## 2014-05-07 DIAGNOSIS — Z7982 Long term (current) use of aspirin: Secondary | ICD-10-CM | POA: Diagnosis not present

## 2014-05-07 DIAGNOSIS — I359 Nonrheumatic aortic valve disorder, unspecified: Secondary | ICD-10-CM | POA: Diagnosis not present

## 2014-05-07 DIAGNOSIS — Z452 Encounter for adjustment and management of vascular access device: Secondary | ICD-10-CM | POA: Diagnosis not present

## 2014-05-07 DIAGNOSIS — I2510 Atherosclerotic heart disease of native coronary artery without angina pectoris: Secondary | ICD-10-CM | POA: Diagnosis not present

## 2014-05-07 DIAGNOSIS — I255 Ischemic cardiomyopathy: Secondary | ICD-10-CM

## 2014-05-07 DIAGNOSIS — R5381 Other malaise: Secondary | ICD-10-CM

## 2014-05-07 DIAGNOSIS — Z7901 Long term (current) use of anticoagulants: Secondary | ICD-10-CM | POA: Diagnosis not present

## 2014-05-07 DIAGNOSIS — R570 Cardiogenic shock: Secondary | ICD-10-CM | POA: Diagnosis present

## 2014-05-07 DIAGNOSIS — E785 Hyperlipidemia, unspecified: Secondary | ICD-10-CM | POA: Diagnosis present

## 2014-05-07 DIAGNOSIS — R55 Syncope and collapse: Secondary | ICD-10-CM | POA: Diagnosis not present

## 2014-05-07 DIAGNOSIS — J811 Chronic pulmonary edema: Secondary | ICD-10-CM | POA: Diagnosis not present

## 2014-05-07 DIAGNOSIS — I5021 Acute systolic (congestive) heart failure: Secondary | ICD-10-CM

## 2014-05-07 DIAGNOSIS — Z955 Presence of coronary angioplasty implant and graft: Secondary | ICD-10-CM

## 2014-05-07 DIAGNOSIS — Z888 Allergy status to other drugs, medicaments and biological substances status: Secondary | ICD-10-CM | POA: Diagnosis not present

## 2014-05-07 DIAGNOSIS — I5022 Chronic systolic (congestive) heart failure: Secondary | ICD-10-CM

## 2014-05-07 DIAGNOSIS — I4891 Unspecified atrial fibrillation: Principal | ICD-10-CM

## 2014-05-07 DIAGNOSIS — J9601 Acute respiratory failure with hypoxia: Secondary | ICD-10-CM

## 2014-05-07 DIAGNOSIS — Z4682 Encounter for fitting and adjustment of non-vascular catheter: Secondary | ICD-10-CM | POA: Diagnosis not present

## 2014-05-07 LAB — PROTIME-INR
INR: 1.63 — ABNORMAL HIGH (ref 0.00–1.49)
Prothrombin Time: 19.3 seconds — ABNORMAL HIGH (ref 11.6–15.2)

## 2014-05-07 LAB — I-STAT TROPONIN, ED: Troponin i, poc: 1.03 ng/mL (ref 0.00–0.08)

## 2014-05-07 LAB — BASIC METABOLIC PANEL
Anion gap: 16 — ABNORMAL HIGH (ref 5–15)
BUN: 34 mg/dL — ABNORMAL HIGH (ref 6–23)
CO2: 25 mEq/L (ref 19–32)
Calcium: 9.7 mg/dL (ref 8.4–10.5)
Chloride: 97 mEq/L (ref 96–112)
Creatinine, Ser: 0.86 mg/dL (ref 0.50–1.10)
GFR calc Af Amer: 68 mL/min — ABNORMAL LOW (ref >90)
GFR calc non Af Amer: 59 mL/min — ABNORMAL LOW (ref >90)
Glucose, Bld: 179 mg/dL — ABNORMAL HIGH (ref 70–99)
Potassium: 5.7 mEq/L — ABNORMAL HIGH (ref 3.7–5.3)
Sodium: 138 mEq/L (ref 137–147)

## 2014-05-07 LAB — CBC
HCT: 46.5 % — ABNORMAL HIGH (ref 36.0–46.0)
HCT: 53.4 % — ABNORMAL HIGH (ref 36.0–46.0)
Hemoglobin: 14.9 g/dL (ref 12.0–15.0)
Hemoglobin: 16.8 g/dL — ABNORMAL HIGH (ref 12.0–15.0)
MCH: 27.7 pg (ref 26.0–34.0)
MCH: 27.5 pg (ref 26.0–34.0)
MCHC: 32 g/dL (ref 30.0–36.0)
MCHC: 31.5 g/dL (ref 30.0–36.0)
MCV: 86.4 fL (ref 78.0–100.0)
MCV: 87.4 fL (ref 78.0–100.0)
Platelets: 351 10*3/uL (ref 150–400)
Platelets: 337 10*3/uL (ref 150–400)
RBC: 5.38 MIL/uL — ABNORMAL HIGH (ref 3.87–5.11)
RBC: 6.11 MIL/uL — ABNORMAL HIGH (ref 3.87–5.11)
RDW: 14.6 % (ref 11.5–15.5)
RDW: 14.8 % (ref 11.5–15.5)
WBC: 11.3 10*3/uL — ABNORMAL HIGH (ref 4.0–10.5)
WBC: 13.3 10*3/uL — ABNORMAL HIGH (ref 4.0–10.5)

## 2014-05-07 LAB — TROPONIN I
Troponin I: 1.59 ng/mL (ref <0.30)
Troponin I: 1.54 ng/mL (ref <0.30)

## 2014-05-07 LAB — TYPE AND SCREEN
ABO/RH(D): O NEG
Antibody Screen: NEGATIVE

## 2014-05-07 LAB — TSH: TSH: 1.04 u[IU]/mL (ref 0.350–4.500)

## 2014-05-07 LAB — HEPARIN LEVEL (UNFRACTIONATED): Heparin Unfractionated: <0.1 IU/mL — ABNORMAL LOW (ref 0.30–0.70)

## 2014-05-07 LAB — MRSA PCR SCREENING: MRSA by PCR: NEGATIVE

## 2014-05-07 LAB — HEMOGLOBIN A1C
Hgb A1c MFr Bld: 6.2 % — ABNORMAL HIGH (ref <5.7)
Mean Plasma Glucose: 131 mg/dL — ABNORMAL HIGH (ref <117)

## 2014-05-07 LAB — APTT: aPTT: 42 seconds — ABNORMAL HIGH (ref 24–37)

## 2014-05-07 LAB — PRO B NATRIURETIC PEPTIDE: Pro B Natriuretic peptide (BNP): 15676 pg/mL — ABNORMAL HIGH (ref 0–450)

## 2014-05-07 MED ORDER — AMIODARONE HCL IN DEXTROSE 360-4.14 MG/200ML-% IV SOLN
60.0000 mg/h | INTRAVENOUS | Status: AC
  Administered 2014-05-07: 60 mg/h via INTRAVENOUS
  Filled 2014-05-07: qty 200

## 2014-05-07 MED ORDER — HEPARIN BOLUS VIA INFUSION
2000.0000 [IU] | Freq: Once | INTRAVENOUS | Status: AC
  Administered 2014-05-07: 2000 [IU] via INTRAVENOUS
  Filled 2014-05-07: qty 2000

## 2014-05-07 MED ORDER — AMIODARONE HCL IN DEXTROSE 360-4.14 MG/200ML-% IV SOLN
30.0000 mg/h | INTRAVENOUS | Status: DC
  Administered 2014-05-07 – 2014-05-15 (×13): 30 mg/h via INTRAVENOUS
  Filled 2014-05-07 (×32): qty 200

## 2014-05-07 MED ORDER — SODIUM CHLORIDE 0.9 % IV SOLN
250.0000 mL | INTRAVENOUS | Status: DC | PRN
  Administered 2014-05-07 – 2014-05-14 (×2): 250 mL via INTRAVENOUS

## 2014-05-07 MED ORDER — ADULT MULTIVITAMIN W/MINERALS CH
1.0000 | ORAL_TABLET | Freq: Every day | ORAL | Status: DC
  Administered 2014-05-11 – 2014-05-20 (×10): 1 via ORAL
  Filled 2014-05-07 (×14): qty 1

## 2014-05-07 MED ORDER — DILTIAZEM HCL 25 MG/5ML IV SOLN
10.0000 mg | Freq: Once | INTRAVENOUS | Status: DC
  Filled 2014-05-07: qty 5

## 2014-05-07 MED ORDER — SODIUM CHLORIDE 0.9 % IJ SOLN: 3.0000 mL | INTRAMUSCULAR | Status: DC | PRN

## 2014-05-07 MED ORDER — DEXTROSE 5 % IV SOLN: 5.0000 mg/h | Freq: Once | INTRAVENOUS | Status: DC

## 2014-05-07 MED ORDER — ZOLPIDEM TARTRATE 5 MG PO TABS: 5.0000 mg | ORAL_TABLET | Freq: Every evening | ORAL | Status: DC | PRN

## 2014-05-07 MED ORDER — ONDANSETRON HCL 4 MG/2ML IJ SOLN: 4.0000 mg | Freq: Four times a day (QID) | INTRAMUSCULAR | Status: DC | PRN

## 2014-05-07 MED ORDER — ASPIRIN 81 MG PO CHEW
81.0000 mg | CHEWABLE_TABLET | Freq: Every day | ORAL | Status: DC
  Administered 2014-05-08 – 2014-05-15 (×8): 81 mg via ORAL
  Filled 2014-05-07 (×8): qty 1

## 2014-05-07 MED ORDER — PROPOFOL 10 MG/ML IV EMUL
INTRAVENOUS | Status: AC
  Administered 2014-05-08: 500 mg
  Filled 2014-05-07: qty 50

## 2014-05-07 MED ORDER — SODIUM CHLORIDE 0.9 % IJ SOLN
3.0000 mL | Freq: Two times a day (BID) | INTRAMUSCULAR | Status: DC
  Administered 2014-05-08 – 2014-05-13 (×10): 3 mL via INTRAVENOUS
  Administered 2014-05-14: 09:00:00 via INTRAVENOUS
  Administered 2014-05-15 – 2014-05-17 (×5): 3 mL via INTRAVENOUS

## 2014-05-07 MED ORDER — FENTANYL CITRATE 0.05 MG/ML IJ SOLN
INTRAMUSCULAR | Status: AC
  Administered 2014-05-08: 100 ug via INTRAVENOUS
  Filled 2014-05-07: qty 4

## 2014-05-07 MED ORDER — HEPARIN (PORCINE) IN NACL 100-0.45 UNIT/ML-% IJ SOLN
950.0000 [IU]/h | INTRAMUSCULAR | Status: DC
Start: 2014-05-07 — End: 2014-05-14
  Administered 2014-05-09: 650 [IU]/h via INTRAVENOUS
  Administered 2014-05-10: 700 [IU]/h via INTRAVENOUS
  Administered 2014-05-12: 800 [IU]/h via INTRAVENOUS
  Administered 2014-05-13: 1000 [IU]/h via INTRAVENOUS
  Administered 2014-05-14: 950 [IU]/h via INTRAVENOUS
  Filled 2014-05-07 (×9): qty 250

## 2014-05-07 MED ORDER — ACETAMINOPHEN 500 MG PO TABS: 500.0000 mg | ORAL_TABLET | Freq: Four times a day (QID) | ORAL | Status: DC | PRN

## 2014-05-07 MED ORDER — AMIODARONE HCL IN DEXTROSE 360-4.14 MG/200ML-% IV SOLN
60.0000 mg/h | Freq: Once | INTRAVENOUS | Status: AC
  Administered 2014-05-07: 60 mg/h via INTRAVENOUS
  Filled 2014-05-07: qty 200

## 2014-05-07 MED ORDER — AMIODARONE IV BOLUS ONLY 150 MG/100ML
150.0000 mg | Freq: Once | INTRAVENOUS | Status: AC
  Administered 2014-05-07: 150 mg via INTRAVENOUS
  Filled 2014-05-07: qty 100

## 2014-05-07 MED ORDER — OMEGA-3-ACID ETHYL ESTERS 1 G PO CAPS
1.0000 g | ORAL_CAPSULE | Freq: Every day | ORAL | Status: DC
  Filled 2014-05-07: qty 1

## 2014-05-07 MED ORDER — MIDAZOLAM HCL 2 MG/2ML IJ SOLN
INTRAMUSCULAR | Status: AC
  Administered 2014-05-08: 1 mg
  Filled 2014-05-07: qty 2

## 2014-05-07 MED ORDER — METOPROLOL TARTRATE 1 MG/ML IV SOLN
5.0000 mg | Freq: Once | INTRAVENOUS | Status: DC
  Filled 2014-05-07: qty 5

## 2014-05-07 MED ORDER — HEPARIN (PORCINE) IN NACL 100-0.45 UNIT/ML-% IJ SOLN
500.0000 [IU]/h | INTRAMUSCULAR | Status: DC
  Administered 2014-05-07: 500 [IU]/h via INTRAVENOUS
  Filled 2014-05-07: qty 250

## 2014-05-07 MED ORDER — ALPRAZOLAM 0.25 MG PO TABS
0.2500 mg | ORAL_TABLET | Freq: Four times a day (QID) | ORAL | Status: DC | PRN
  Administered 2014-05-07: 0.25 mg via ORAL
  Filled 2014-05-07: qty 1

## 2014-05-07 MED ORDER — MORPHINE SULFATE 2 MG/ML IJ SOLN
1.0000 mg | INTRAMUSCULAR | Status: DC | PRN
  Administered 2014-05-07: 1 mg via INTRAVENOUS
  Filled 2014-05-07: qty 1

## 2014-05-07 MED ORDER — FUROSEMIDE 10 MG/ML IJ SOLN
INTRAMUSCULAR | Status: AC
  Administered 2014-05-07: 80 mg
  Filled 2014-05-07: qty 8

## 2014-05-07 MED ORDER — ACETAMINOPHEN 325 MG PO TABS: 650.0000 mg | ORAL_TABLET | ORAL | Status: DC | PRN

## 2014-05-07 MED ORDER — NITROGLYCERIN 0.4 MG SL SUBL: 0.4000 mg | SUBLINGUAL_TABLET | SUBLINGUAL | Status: DC | PRN

## 2014-05-07 MED ORDER — AMIODARONE HCL IN DEXTROSE 360-4.14 MG/200ML-% IV SOLN
INTRAVENOUS | Status: AC
Start: 2014-05-07 — End: 2014-05-08
  Filled 2014-05-07: qty 200

## 2014-05-07 NOTE — ED Notes (Signed)
Pt placed on a hospital bed and informed her room was not ready at this time. Pt verbalized understanding.

## 2014-05-07 NOTE — ED Notes (Signed)
PA talking to cardiology at this time.

## 2014-05-07 NOTE — ED Notes (Signed)
Phlebotomy at the bedside  

## 2014-05-07 NOTE — ED Notes (Signed)
Dr. Horton at the bedside.  

## 2014-05-07 NOTE — ED Provider Notes (Signed)
CSN: [id number]     Arrival date & time 05/07/14  0902 History   First MD Initiated Contact with Patient 05/07/14 0913     Chief Complaint  Patient presents with  . Chest Pain  . Near Syncope     (Consider location/radiation/quality/duration/timing/severity/associated sxs/prior Treatment) Patient is a 80 y.o. female presenting with chest pain and near-syncope. The history is provided by the patient and medical records.  Chest Pain Associated symptoms: near-syncope   Near Syncope Associated symptoms include chest pain.   This is an 80 y.o. F with PMH significant for HTN, HLP. CAD s/p PCI, CHF, presenting to the ED for near syncope.  Per son, states she bent over to pick something up off the floor and when she stood up and nearly passed out.  He was able to catch her to prevent her from hitting the floor.  No head injury or LOC.  States after coming to she felt that her heart was beating very rapidly.  She denies chest pain or shortness of breath.  No recent weight gain or night time orthopnea.  Pt is followed regularly by Dr. Haroldine Laws, last office visit was 2 days ago.  Last 2D echo on 01/17/14 with LV EF: 20% - 25%.  She has already taken her ASA today.  Past Medical History  Diagnosis Date  . PVD (peripheral vascular disease)   . Ventricular dysfunction     left; ischemic  . Hyperlipidemia   . AAA (abdominal aortic aneurysm)   . Renal artery stenosis   . Hypertension   . Coronary artery disease 2007    moderate ASCAD of the left system s/p PCI of the D2 and PCI of the left circ 03/2009  . CHF (congestive heart failure)    Past Surgical History  Procedure Laterality Date  . Retinal cryopexy      right eye (for retinal detachment)  . Angioplasty    . Coronary stent placement     Family History  Problem Relation Age of Onset  . Heart disease Mother   . Heart disease Father   . Heart disease Brother     x 3   History  Substance Use Topics  . Smoking status: Never Smoker    . Smokeless tobacco: Never Used  . Alcohol Use: Yes     Comment: occasionally   OB History   Grav Para Term Preterm Abortions TAB SAB Ect Mult Living                 Review of Systems  Cardiovascular: Positive for chest pain and near-syncope.  Neurological: Positive for syncope (near syncope).  All other systems reviewed and are negative.   Allergies  Codeine and Garlic  Home Medications   Prior to Admission medications   Medication Sig Start Date End Date Taking? Authorizing Provider  acetaminophen (TYLENOL) 500 MG tablet Take 500 mg by mouth every 6 (six) hours as needed.    Historical Provider, MD  aspirin 81 MG chewable tablet Chew 81 mg by mouth daily.    Historical Provider, MD  lisinopril (PRINIVIL,ZESTRIL) 5 MG tablet Take 5 mg by mouth daily.    Historical Provider, MD  metoprolol succinate (TOPROL-XL) 25 MG 24 hr tablet Take 1 tablet (25 mg total) by mouth daily. 04/25/14   Irven Shelling, MD  Multiple Vitamin (MULTIVITAMIN) tablet Take 1 tablet by mouth daily.    Historical Provider, MD  nitroGLYCERIN (NITROSTAT) 0.4 MG SL tablet Place 0.4 mg under the tongue  every 5 (five) minutes as needed for chest pain.    Historical Provider, MD  Omega-3 Fatty Acids (FISH OIL) 1200 MG CAPS Take 1 capsule by mouth daily.    Historical Provider, MD  potassium chloride SA (K-DUR,KLOR-CON) 20 MEQ tablet Take 1 tablet (20 mEq total) by mouth 2 (two) times daily. 04/28/14   Jolaine Artist, MD  torsemide (DEMADEX) 20 MG tablet Take 1 tablet (20 mg total) by mouth 2 (two) times daily. 04/28/14   Shaune Pascal Bensimhon, MD   BP 103/80  Pulse 122  Temp(Src) 98.1 F (36.7 C) (Oral)  Resp 18  Ht 5' (1.524 m)  Wt 92 lb (41.731 kg)  BMI 17.97 kg/m2  SpO2 100%  Physical Exam  Nursing note and vitals reviewed. Constitutional: She is oriented to person, place, and time. She appears well-developed and well-nourished. No distress.  Elderly, thin  HENT:  Head: Normocephalic and  atraumatic.  Mouth/Throat: Oropharynx is clear and moist.  Eyes: Conjunctivae and EOM are normal. Pupils are equal, round, and reactive to light.  Neck: Normal range of motion. Neck supple.  Cardiovascular: Normal heart sounds, intact distal pulses and normal pulses.  An irregularly irregular rhythm present. Tachycardia present.   AFIB  Pulmonary/Chest: Effort normal and breath sounds normal. No respiratory distress. She has no wheezes.  Abdominal: Soft. Bowel sounds are normal. There is no tenderness. There is no guarding.  Musculoskeletal: Normal range of motion.  Neurological: She is alert and oriented to person, place, and time.  Skin: Skin is warm and dry. She is not diaphoretic.  Psychiatric: She has a normal mood and affect.    ED Course  Procedures (including critical care time) Labs Review Labs Reviewed  CBC - Abnormal; Notable for the following:    WBC 11.3 (*)    RBC 5.38 (*)    HCT 46.5 (*)    All other components within normal limits  BASIC METABOLIC PANEL - Abnormal; Notable for the following:    Potassium 5.7 (*)    Glucose, Bld 179 (*)    BUN 34 (*)    GFR calc non Af Amer 59 (*)    GFR calc Af Amer 68 (*)    Anion gap 16 (*)    All other components within normal limits  PRO B NATRIURETIC PEPTIDE - Abnormal; Notable for the following:    Pro B Natriuretic peptide (BNP) 15676.0 (*)    All other components within normal limits  I-STAT TROPOININ, ED - Abnormal; Notable for the following:    Troponin i, poc 1.03 (*)    All other components within normal limits    Imaging Review Dg Chest Port 1 View  05/07/2014   CLINICAL DATA:  Syncopal episode  EXAM: PORTABLE CHEST - 1 VIEW  COMPARISON:  04/23/2014  FINDINGS: Cardiac shadow remains enlarged. Diffuse fibrotic changes are again identified. No sizable effusion or focal infiltrate is noted. Chronic changes about shoulder joints are seen.  IMPRESSION: Chronic changes without acute abnormality.   Electronically Signed    By: Inez Catalina M.D.   On: 05/07/2014 10:04     EKG Interpretation   Date/Time:  Saturday May 07 2014 09:08:29 EDT Ventricular Rate:  136 PR Interval:    QRS Duration: 134 QT Interval:  320 QTC Calculation: 481 R Axis:   -51 Text Interpretation:  Atrial fibrillation with rapid ventricular response  Left axis deviation Non-specific intra-ventricular conduction block T wave  abnormality, consider lateral ischemia or digitalis effect Abnormal ECG  Afib new from prior Confirmed by HORTON  MD, COURTNEY ([id number]) on 05/07/2014  9:18:03 AM      CRITICAL CARE Performed by: Larene Pickett   Total critical care time: 35  Critical care time was exclusive of separately billable procedures and treating other patients.  Critical care was necessary to treat or prevent imminent or life-threatening deterioration.  Critical care was time spent personally by me on the following activities: development of treatment plan with patient and/or surrogate as well as nursing, discussions with consultants, evaluation of patient's response to treatment, examination of patient, obtaining history from patient or surrogate, ordering and performing treatments and interventions, ordering and review of laboratory studies, ordering and review of radiographic studies, pulse oximetry and re-evaluation of patient's condition.  Medications  amiodarone (NEXTERONE) IV bolus only 150 mg/100 mL (not administered)  amiodarone (NEXTERONE PREMIX) 360 MG/200ML (1.8 mg/mL) IV infusion (60 mg/hr Intravenous New Bag/Given 05/07/14 1023)    MDM   Final diagnoses:  Atrial fibrillation, unspecified  NSTEMI, initial episode of care  Acute on chronic combined systolic and diastolic congestive heart failure   80 year old female with near syncopal episode this morning. On arrival she is alert and oriented. She denies any current chest pain, shortness of breath, palpitations, dizziness, or weakness.  EKG revealing new onset A.  fib with RVR. Troponin elevated at 1.03.  BNP [id number].  Initially ordered Cardizem, however BP soft at 88/71.  Given EF of 20-25%, will avoid fluid bolus. Rate in 120's at this time, will hold medications for now pending cardiology consult.  Have spoken with Dr. Debara Pickett who will see pt in the ED and admit.  Patient's personal cardiologist, Dr. Haroldine Laws, has called and recommended starting on amiodarone bolus of 150mg , then drip of 60mg /hr which was done.  Pt tolerating medications well, BP soft but stable.  Larene Pickett, PA-C 05/07/14 [phone]

## 2014-05-07 NOTE — ED Notes (Signed)
Jacqueline Reilly, son, [phone] and parents landline [phone]

## 2014-05-07 NOTE — ED Notes (Signed)
Son and husband at the bedside. Pt expressing that she wishes she could go home and get her things together and come back. Son and husband are upset at that. Explained to pt her need to be here and to the family. Explained that the PA has already been paged for a question this Probation officer had and that this Probation officer would inform her that she needed to come back and talk to the patient. Pt verbalized understanding at that decision.

## 2014-05-07 NOTE — Progress Notes (Signed)
CRITICAL VALUE ALERT  Critical value received:  Troponin 1.59  Date of notification:  1.59  Time of notification:  1620  Critical value read back:Yes.    Nurse who received alert:  Novella Rob, RN  MD notified (1st page):  Cardiology on call   Time of first page:  76  MD notified (2nd page):  Time of second page:  Responding MD:  Cardiology on Call  Time MD responded:  1740

## 2014-05-07 NOTE — ED Notes (Signed)
Pt very vague with complaints, sts her son thoguht she may have passed out for a few seconds and sts she doesn't know how to describe what is going on with her heart, sts she doesn't want to over describe it.

## 2014-05-07 NOTE — ED Notes (Signed)
Lattie Haw, PA at the bedside.

## 2014-05-07 NOTE — ED Notes (Signed)
Results of troponin given to Dr. Dina Rich

## 2014-05-07 NOTE — Progress Notes (Signed)
Pt presented to this unit from the ED via stretcher at approximately 1640, she was tachypenic 30 RR upon presentation and appeared to be anxious with an Initial temp of 100.2 rectally. Family remained at bedside.  She was placed on 2L O2 [place] and given a .25mg  Xanax tab,  Lab called critical value of positive troponin (1.59) ;MD paged and informed . MD acknowledged nurse concerns.  She remained tachypenic 30-40RR and anxious stating she was having a hard time breathing with c/o back pain.  Nurse contacted MD again to inform of new events, MD ordered Morphine.  1mg  of Morphine administered, pt rested.  Family at beside aroused pt, pt begin experiencing tachypnea and anxiousness.  Lung sounds coarse during reassessment.  Pt saturations decreased into 80's on 4L [place], pt was placed on non-rebreather, saturation increased immediately to 100.  MD paged to inform of new finding and new interventions.  MD stated he was coming to bedside.  During interim; rapid response and respiratory were called to bedside to assess pt.  MD arrived at bedside.  Orders were written.

## 2014-05-07 NOTE — Significant Event (Addendum)
Cross Cover Note  On admission to floor patient was noted to have mild increase in oxygen requirement from 2 to 4L. Waveform was difficult to interpret. Also some back pain. Initially given IV morphine with good result. With IV amio, rhythm converted to NSR. BP improved with coversion.   Patient developed progressive oxygen requirement (requiring NRB) and obvious tachypnea, resp distress. Exam NB for RRR, diffuse crackles anteriorly and posteriorly. + JVD. LE cool. Abd soft.   Impression: Pulm edema in setting of AF (now NSR) and severe systolic HF. Her cool extremities suggest the possibility of a low flow state.   1. Continue IV amiodarone 2. Start BiBAP 3. ABG 4. 80mg  IV lasix 5. Continue to hold BB  Lamar Sprinkles, MD  Addendum VBG after BiPAP demonstrated resp acidosis.  Patient still in severe resp distress, tachypneic to 40 UOP 150cc over 1st hour after lasix Although the patient reports some subjective improvement, she remains tenuous and she may require intubation if within [place]. This was discussed with family and patient and all seemed to want to pursue intubation if she fails BiPAP.   Plan to transfer to ICU and obtain CCM consult given likely failure of NIPPV.   CCT 30 minutes  Lamar Sprinkles, MD

## 2014-05-07 NOTE — Progress Notes (Addendum)
ANTICOAGULATION CONSULT NOTE - Initial Consult  Pharmacy Consult for Heparin Indication: chest pain/ACS  Allergies  Allergen Reactions  . Codeine Nausea Only  . Garlic Nausea Only   Patient Measurements: Height: 5' (152.4 cm) Weight: 92 lb (41.731 kg) IBW/kg (Calculated) : 45.5  Vital Signs: Temp: 98.1 F (36.7 C) (07/11 0912) Temp src: Oral (07/11 0912) BP: 110/88 mmHg (07/11 1300) Pulse Rate: 58 (07/11 1300)  Labs:  Recent Labs  05/05/14 1023 05/07/14 0920  HGB  --  14.9  HCT  --  46.5*  PLT  --  351  CREATININE 0.76 0.86   Estimated Creatinine Clearance: 29.8 ml/min (by C-G formula based on Cr of 0.86).  Medical History: Past Medical History  Diagnosis Date  . PVD (peripheral vascular disease)   . Ventricular dysfunction     left; ischemic  . Hyperlipidemia   . AAA (abdominal aortic aneurysm)   . Renal artery stenosis   . Hypertension   . Coronary artery disease 2007    moderate ASCAD of the left system s/p PCI of the D2 and PCI of the left circ 03/2009  . CHF (congestive heart failure)    Assessment: 80yo female presents with near syncopal episode according to son.  She was found to be in Afib with RVR in the ED.   Her troponins were elevated as well as a significant elevation in her BNP.  We have been asked to start some IV heparin for CVA risk reduction while her workup is ongoing.  Her CBC is stable as is her platelets.  Goal of Therapy:  Heparin level 0.3-0.7 units/ml Monitor platelets by anticoagulation protocol: Yes   Plan:  1.  Heparin 2000 units IV bolus x1 2.  Heparin infusion at 500 units/hr 3.  Obtain a heparin level in 6 hours and adjust.  Rober Minion, PharmD., [place] Clinical Pharmacist Pager:  [phone] Thank you for allowing pharmacy to be part of this patients care team. 05/07/2014,1:20 PM  Addendum  Heparin level undetectable on 500 units/hr, no issue with infusion, no bleeding noted per RN  Plan: Increase heparin rate to 650  units/hr F/u AM heparin level

## 2014-05-07 NOTE — H&P (Addendum)
The patient was seen and examined, and I agree with the assessment and plan as documented above, with modifications as noted below. 80 yr old pt of Dr. Haroldine Laws whom he recently evaluated in HF clinic, presented to ED with fatigue and syncope, found to be in rapid atrial fibrillation. Has a h/o CAD, AAA, dementia and chronic systolic heart failure. Echo in 12/2013 demonstrated EF 20-25% with severe LA dilatation.  Nuclear stress in 09/2013 showed ischemia in apex and distal inferior wall. Pt currently denies chest pain. SBP ranging from 85-105 mmHg. Troponin 1.03. ECG demonstrates atrial fibrillation with IVCD and borderline LVH, with more prominent ST depressions in V5-6 when compared to ECG dated 04/23/14. CXR shows no evidence of pulmonary edema. BNP [id number]. BUN/creatinine 34/0.86 (previously 15/0.67 on 04/24/14).  RECS: It has been communicated to me by Dr. Debara Pickett (who spoke with Dr. Haroldine Laws) that there are no plans for coronary angiography in spite of mild troponin elevation and ischemia noted on nuclear MPI study from 09/2013. Given her depressed LV systolic function, she will not tolerate rapid atrial fibrillation very well without high potential for decompensated heart failure. However, elevated BUN/creatinine suggests she may be somewhat dry. Given relative hypotension, will hold metoprolol, lisinopril, and torsemide. Will treat with IV amiodarone and IV heparin. Cycle cardiac enzymes, although mild elevation may be a function of demand ischemia. Treatment plan discussed with son from [place] and brother.

## 2014-05-07 NOTE — H&P (Signed)
CARDIOLOGY HISTORY AND PHYSICAL   Patient ID: Jacqueline Reilly MRN: [id number] DOB/AGE: 1924-05-21 80 y.o.  Admit date: 05/07/2014  Primary Physician   Irven Shelling, MD Primary Cardiologist   Dr. Haroldine Laws Reason for Consultation   atrial fibrillation, rapid ventricular response  [id number] S [place] is a 80 y.o. female with a history of CAD, AAA and systolic CHF. She came to the emergency room today with a history of near syncope. She may have blacked out for a few seconds.  In the emergency room, she was found to be in atrial fibrillation with rapid ventricular response and cardiology was asked to evaluate her.  Jacqueline Reilly feels that she was in her usual state of health this morning. She had no chest pain, shortness of breath and has never had palpitations. After she had been up for a while, she developed a feeling of fatigue. That is the only symptom which she remembers. Specifically she denies presyncope, shortness of breath or any awareness that her heart is out of rhythm or rapid. Her son describes an episode where she came out of the bathroom, but down to give him a hot and became briefly unresponsive. Upon waking, she was oriented to person and place. She had no seizure activity and had not been incontinent. He says she was out for just a few seconds. He was able to catch her to keep her from falling so there were no injuries. She was reluctant to come to the hospital but he was able to get her here. In the emergency room, her systolic blood pressure is in the 90s, occasionally dropping into the 80s, but she is mentating well and is asymptomatic at rest. Her heart rate on arrival to the emergency room was over 140, but is now down closer to 100 on IV amiodarone.   Past Medical History  Diagnosis Date  . PVD (peripheral vascular disease)   . Ventricular dysfunction     left; ischemic  . Hyperlipidemia   . AAA (abdominal aortic aneurysm)   . Renal artery stenosis   .  Hypertension   . Coronary artery disease 2007    moderate ASCAD of the left system s/p PCI of the D2 and PCI of the left circ 03/2009  . CHF (congestive heart failure)      Past Surgical History  Procedure Laterality Date  . Retinal cryopexy      right eye (for retinal detachment)  . Angioplasty    . Coronary stent placement      Allergies  Allergen Reactions  . Codeine Nausea Only  . Garlic Nausea Only    I have reviewed the patient's current medications Medication Sig  acetaminophen (TYLENOL) 500 MG tablet Take 500 mg by mouth every 6 (six) hours as needed (for pain).   aspirin 81 MG chewable tablet Chew 81 mg by mouth daily.  lisinopril (PRINIVIL,ZESTRIL) 5 MG tablet Take 5 mg by mouth daily.  metoprolol succinate (TOPROL-XL) 25 MG 24 hr tablet Take 1 tablet (25 mg total) by mouth daily.  Multiple Vitamin (MULTIVITAMIN) tablet Take 1 tablet by mouth daily.  nitroGLYCERIN (NITROSTAT) 0.4 MG SL tablet Place 0.4 mg under the tongue every 5 (five) minutes as needed for chest pain.  Omega-3 Fatty Acids (FISH OIL) 1200 MG CAPS Take 1 capsule by mouth daily.  potassium chloride SA (K-DUR,KLOR-CON) 20 MEQ tablet Take 20 mEq by mouth 2 (two) times daily.   torsemide (DEMADEX) 20 MG tablet Take 40 mg by  mouth 2 (two) times daily.     History   Social History  . Marital Status: Married    Spouse Name: N/A    Number of Children: 1  . Years of Education: N/A   Occupational History  . retired    Social History Main Topics  . Smoking status: Never Smoker   . Smokeless tobacco: Never Used  . Alcohol Use: Yes     Comment: occasionally  . Drug Use: No  . Sexual Activity: Not on file   Other Topics Concern  . Not on file   Social History Narrative   She lives in [place], family helps with her care.    Family Status  Relation Status Death Age  . Mother Deceased   . Father Deceased    Family History  Problem Relation Age of Onset  . Heart disease Mother   . Heart  disease Father   . Heart disease Brother     x 3     ROS:  Full 14 point review of systems complete and found to be negative unless listed above.  Physical Exam: Blood pressure 88/68, pulse 113, temperature 98.1 F (36.7 C), temperature source Oral, resp. rate 22, height 5' (1.524 m), weight 92 lb (41.731 kg), SpO2 97.00%.  General: Well developed, well nourished, female in no acute distress Head: Eyes PERRLA, No xanthomas.   Normocephalic and atraumatic, oropharynx without edema or exudate. Dentition: Poor Lungs: Bilateral dense rales Heart: Heart rapid and irregular rate and rhythm with S1, S2, no significant  murmur. pulses are 2+ in all 4 extrem.   Neck: No carotid bruits. No lymphadenopathy.  JVD elevated at 10. Abdomen: Bowel sounds present, abdomen soft and non-tender without masses or hernias noted. Msk:  No spine or cva tenderness. No weakness, no joint deformities or effusions. Extremities: No clubbing or cyanosis. No edema.  Neuro: Alert and oriented X 3. No focal deficits noted. Psych:  Good affect, responds appropriately Skin: No rashes or lesions noted.  Labs:   Lab Results  Component Value Date   WBC 11.3* 05/07/2014   HGB 14.9 05/07/2014   HCT 46.5* 05/07/2014   MCV 86.4 05/07/2014   PLT 351 05/07/2014     Recent Labs Lab 05/07/14 0920  NA 138  K 5.7*  CL 97  CO2 25  BUN 34*  CREATININE 0.86  CALCIUM 9.7  GLUCOSE 179*    Recent Labs  05/07/14 0930  TROPIPOC 1.03*   Pro B Natriuretic peptide (BNP)  Date/Time Value Ref Range Status  05/07/2014  9:20 AM 15676.0* 0 - 450 pg/mL Final  04/28/2014 11:07 AM 15377.0* 0 - 450 pg/mL Final   TSH  Date/Time Value Ref Range Status  04/23/2014  6:00 AM 0.654  0.350 - 4.500 uIU/mL Final   Echo: 01/17/2014 - Left ventricle: There is severe global hypokinesis and akinesis of the basal and mid anteroseptal,anterior, anterolateral walls and distal septal and anterior walls. An apical thrombus cannot be excluded. An  echocardiogram with conrast is recommended. The cavity size was severely dilated. Systolic function was severely reduced. The estimated ejection fraction was in the range of 20% to 25%. - Aortic valve: Mildly thickened, mildly calcified leaflets. - Left atrium: The atrium was severely dilated. - Right ventricle: Systolic function was mildly reduced. Impressions: - This was a limited study without use of color Doppler.  ECG:  05/07/2004 Atrial fibrillation with rapid ventricular response, underlying left bundle branch block with lateral ST depression Vent. rate 136 BPM  PR interval * ms QRS duration 134 ms QT/QTc 320/481 ms P-R-T axes * -51 112  Radiology:  Dg Chest Port 1 View 05/07/2014   CLINICAL DATA:  Syncopal episode  EXAM: PORTABLE CHEST - 1 VIEW  COMPARISON:  04/23/2014  FINDINGS: Cardiac shadow remains enlarged. Diffuse fibrotic changes are again identified. No sizable effusion or focal infiltrate is noted. Chronic changes about shoulder joints are seen.  IMPRESSION: Chronic changes without acute abnormality.   Electronically Signed   By: Inez Catalina M.D.   On: 05/07/2014 10:04    ASSESSMENT AND PLAN:   The patient was seen today by Dr. Bronson Ing, the patient evaluated and the data reviewed.   Principal Problem:   Atrial fibrillation with rapid ventricular response - No previous history of this, no specific symptoms attributed to it. Will continue IV amiodarone, add heparin. Consider coumadin or NOAC for anticoagulation.   Active Problems:   NSTEMI, initial episode of care - heparin for now. Have to hold ACE/BB with hypotension. Medical therapy planned, no cath.     Chronic systolic CHF - BNP higher than previous, weight is in range (90-93). Need to hold diuretic for now with hypotension, follow volume status carefully.     Hypotension - likely secondary to loss of atrial kick and elevated HR in a patient with low EF. Hold diuretic today, follow I/O, BP. Resume when able.     SignedRosaria Ferries, PA-C 05/07/2014 12:40 PM Bee[place] [id number]  Co-Sign MD

## 2014-05-07 NOTE — ED Notes (Signed)
Pt refusing to use a bedpan, and does not really want to use a bedside commode. Pt encouraged that was the safest way to use the rest room since she easily got SOB.

## 2014-05-07 NOTE — ED Provider Notes (Signed)
Medical screening examination/treatment/procedure(s) were conducted as a shared visit with non-physician practitioner(s) and myself.  I personally evaluated the patient during the encounter.   EKG Interpretation   Date/Time:  Saturday May 07 2014 09:08:29 EDT Ventricular Rate:  136 PR Interval:    QRS Duration: 134 QT Interval:  320 QTC Calculation: 481 R Axis:   -51 Text Interpretation:  Atrial fibrillation with rapid ventricular response  Left axis deviation Non-specific intra-ventricular conduction block T wave  abnormality, consider lateral ischemia or digitalis effect Abnormal ECG  Afib new from prior Confirmed by HORTON  MD, Loma Sousa ([id number]) on 05/07/2014  9:18:03 AM      This is an 80 year old female with history of coronary artery disease, AAA, and congestive heart failure with an EF of 20-25% who presents with a near syncopal episode. History obtained from her son and the patient. Per the patient's son, she leaned over this morning and when she stood back up she gasp and may have blacked out for several seconds.  She denies any chest pain or shortness of breath at that time. She's taken a full dose aspirin today. Recent admission for heart failure. Last seen by Dr. Haroldine Laws 2 days ago. EKG shows new onset atrial fibrillation. Patient's heart rate in the 120s. She is nontoxic on exam. No active chest pain. Blood pressure noted to be soft with systolic in the [id number] or 0000000. Has taken her blood pressure medicine this morning. Troponin is positive. We'll hold treating A. fib given low blood pressure and wait for cardiology to evaluation given that her rates are 110-120.  Patient will require admission.  Discussed with Bensimhon.  Started on amiodarone drip.  CRITICAL CARE Performed by: Thayer Jew, F   Total critical care time: 45 min  Critical care time was exclusive of separately billable procedures and treating other patients.  Critical care was necessary to treat or prevent  imminent or life-threatening deterioration.  Critical care was time spent personally by me on the following activities: development of treatment plan with patient and/or surrogate as well as nursing, discussions with consultants, evaluation of patient's response to treatment, examination of patient, obtaining history from patient or surrogate, ordering and performing treatments and interventions, ordering and review of laboratory studies, ordering and review of radiographic studies, pulse oximetry and re-evaluation of patient's condition.   Merryl Hacker, MD 05/07/14 [phone]

## 2014-05-08 ENCOUNTER — Inpatient Hospital Stay (HOSPITAL_COMMUNITY): Payer: Medicare Other

## 2014-05-08 DIAGNOSIS — J9600 Acute respiratory failure, unspecified whether with hypoxia or hypercapnia: Secondary | ICD-10-CM

## 2014-05-08 DIAGNOSIS — I359 Nonrheumatic aortic valve disorder, unspecified: Secondary | ICD-10-CM

## 2014-05-08 DIAGNOSIS — I509 Heart failure, unspecified: Secondary | ICD-10-CM

## 2014-05-08 DIAGNOSIS — I5023 Acute on chronic systolic (congestive) heart failure: Secondary | ICD-10-CM

## 2014-05-08 DIAGNOSIS — J9601 Acute respiratory failure with hypoxia: Secondary | ICD-10-CM | POA: Diagnosis not present

## 2014-05-08 DIAGNOSIS — R609 Edema, unspecified: Secondary | ICD-10-CM

## 2014-05-08 LAB — BLOOD GAS, ARTERIAL
Acid-base deficit: 6 mmol/L — ABNORMAL HIGH (ref 0.0–2.0)
Acid-base deficit: 7.9 mmol/L — ABNORMAL HIGH (ref 0.0–2.0)
Bicarbonate: 20.2 mEq/L (ref 20.0–24.0)
Bicarbonate: 17.3 mEq/L — ABNORMAL LOW (ref 20.0–24.0)
Drawn by: 36989
Drawn by: 36989
FIO2: 100 %
FIO2: 50 %
MECHVT: 360 mL
MECHVT: 360 mL
O2 Saturation: 99.4 %
O2 Saturation: 97.9 %
PEEP: 5 cmH2O
PEEP: 5 cmH2O
Patient temperature: 98.6
Patient temperature: 98.6
RATE: 12 resp/min
RATE: 18 resp/min
TCO2: 21.7 mmol/L (ref 0–100)
TCO2: 18.4 mmol/L (ref 0–100)
pCO2 arterial: 49.5 mmHg — ABNORMAL HIGH (ref 35.0–45.0)
pCO2 arterial: 35.9 mmHg (ref 35.0–45.0)
pH, Arterial: 7.235 — ABNORMAL LOW (ref 7.350–7.450)
pH, Arterial: 7.304 — ABNORMAL LOW (ref 7.350–7.450)
pO2, Arterial: 312 mmHg — ABNORMAL HIGH (ref 80.0–100.0)
pO2, Arterial: 122 mmHg — ABNORMAL HIGH (ref 80.0–100.0)

## 2014-05-08 LAB — COMPREHENSIVE METABOLIC PANEL
ALT: 32 U/L (ref 0–35)
AST: 61 U/L — ABNORMAL HIGH (ref 0–37)
Albumin: 3.2 g/dL — ABNORMAL LOW (ref 3.5–5.2)
Alkaline Phosphatase: 96 U/L (ref 39–117)
Anion gap: 22 — ABNORMAL HIGH (ref 5–15)
BUN: 41 mg/dL — ABNORMAL HIGH (ref 6–23)
CO2: 18 mEq/L — ABNORMAL LOW (ref 19–32)
Calcium: 8.7 mg/dL (ref 8.4–10.5)
Chloride: 102 mEq/L (ref 96–112)
Creatinine, Ser: 1.16 mg/dL — ABNORMAL HIGH (ref 0.50–1.10)
GFR calc Af Amer: 47 mL/min — ABNORMAL LOW (ref >90)
GFR calc non Af Amer: 41 mL/min — ABNORMAL LOW (ref >90)
Glucose, Bld: 164 mg/dL — ABNORMAL HIGH (ref 70–99)
Potassium: 5.8 mEq/L — ABNORMAL HIGH (ref 3.7–5.3)
Sodium: 142 mEq/L (ref 137–147)
Total Bilirubin: 0.3 mg/dL (ref 0.3–1.2)
Total Protein: 6.5 g/dL (ref 6.0–8.3)

## 2014-05-08 LAB — MAGNESIUM: Magnesium: 2.5 mg/dL (ref 1.5–2.5)

## 2014-05-08 LAB — GLUCOSE, CAPILLARY
Glucose-Capillary: 237 mg/dL — ABNORMAL HIGH (ref 70–99)
Glucose-Capillary: 183 mg/dL — ABNORMAL HIGH (ref 70–99)
Glucose-Capillary: 112 mg/dL — ABNORMAL HIGH (ref 70–99)
Glucose-Capillary: 153 mg/dL — ABNORMAL HIGH (ref 70–99)
Glucose-Capillary: 106 mg/dL — ABNORMAL HIGH (ref 70–99)

## 2014-05-08 LAB — LIPID PANEL
Cholesterol: 217 mg/dL — ABNORMAL HIGH (ref 0–200)
HDL: 69 mg/dL (ref >39)
LDL Cholesterol: 131 mg/dL — ABNORMAL HIGH (ref 0–99)
Total CHOL/HDL Ratio: 3.1 RATIO
Triglycerides: 87 mg/dL (ref <150)
VLDL: 17 mg/dL (ref 0–40)

## 2014-05-08 LAB — BASIC METABOLIC PANEL
Anion gap: 17 — ABNORMAL HIGH (ref 5–15)
BUN: 43 mg/dL — ABNORMAL HIGH (ref 6–23)
CO2: 24 mEq/L (ref 19–32)
Calcium: 9.9 mg/dL (ref 8.4–10.5)
Chloride: 103 mEq/L (ref 96–112)
Creatinine, Ser: 1.1 mg/dL (ref 0.50–1.10)
GFR calc Af Amer: 50 mL/min — ABNORMAL LOW (ref >90)
GFR calc non Af Amer: 44 mL/min — ABNORMAL LOW (ref >90)
Glucose, Bld: 149 mg/dL — ABNORMAL HIGH (ref 70–99)
Potassium: 4.7 mEq/L (ref 3.7–5.3)
Sodium: 144 mEq/L (ref 137–147)

## 2014-05-08 LAB — ABO/RH: ABO/RH(D): O NEG

## 2014-05-08 LAB — CBC
HCT: 49.4 % — ABNORMAL HIGH (ref 36.0–46.0)
Hemoglobin: 15.4 g/dL — ABNORMAL HIGH (ref 12.0–15.0)
MCH: 27.3 pg (ref 26.0–34.0)
MCHC: 31.2 g/dL (ref 30.0–36.0)
MCV: 87.6 fL (ref 78.0–100.0)
Platelets: 291 10*3/uL (ref 150–400)
RBC: 5.64 MIL/uL — ABNORMAL HIGH (ref 3.87–5.11)
RDW: 14.8 % (ref 11.5–15.5)
WBC: 16.1 10*3/uL — ABNORMAL HIGH (ref 4.0–10.5)

## 2014-05-08 LAB — HEPARIN LEVEL (UNFRACTIONATED)
Heparin Unfractionated: 0.39 IU/mL (ref 0.30–0.70)
Heparin Unfractionated: 0.41 IU/mL (ref 0.30–0.70)

## 2014-05-08 LAB — STREP PNEUMONIAE URINARY ANTIGEN: Strep Pneumo Urinary Antigen: NEGATIVE

## 2014-05-08 LAB — TROPONIN I: Troponin I: 2.18 ng/mL (ref <0.30)

## 2014-05-08 LAB — PROCALCITONIN: Procalcitonin: 0.42 ng/mL

## 2014-05-08 LAB — CARBOXYHEMOGLOBIN
Carboxyhemoglobin: 1 % (ref 0.5–1.5)
Methemoglobin: 0.7 % (ref 0.0–1.5)
O2 Saturation: 69.3 %
Total hemoglobin: 12.7 g/dL (ref 12.0–16.0)

## 2014-05-08 LAB — LACTIC ACID, PLASMA: Lactic Acid, Venous: 5.7 mmol/L — ABNORMAL HIGH (ref 0.5–2.2)

## 2014-05-08 LAB — PHOSPHORUS: Phosphorus: 5 mg/dL — ABNORMAL HIGH (ref 2.3–4.6)

## 2014-05-08 LAB — CORTISOL: Cortisol, Plasma: 113.3 ug/dL

## 2014-05-08 MED ORDER — SODIUM CHLORIDE 0.9 % IV BOLUS (SEPSIS)
250.0000 mL | Freq: Once | INTRAVENOUS | Status: AC
  Administered 2014-05-08: 250 mL via INTRAVENOUS

## 2014-05-08 MED ORDER — SODIUM CHLORIDE 0.9 % IV SOLN
1.0000 g | Freq: Once | INTRAVENOUS | Status: AC
  Administered 2014-05-08: 1 g via INTRAVENOUS
  Filled 2014-05-08: qty 10

## 2014-05-08 MED ORDER — DEXTROSE 5 % IV SOLN
1.0000 g | Freq: Every day | INTRAVENOUS | Status: DC
  Administered 2014-05-08 – 2014-05-15 (×9): 1 g via INTRAVENOUS
  Filled 2014-05-08 (×10): qty 10

## 2014-05-08 MED ORDER — SODIUM CHLORIDE 0.9 % IJ SOLN
10.0000 mL | Freq: Two times a day (BID) | INTRAMUSCULAR | Status: DC
  Administered 2014-05-09 – 2014-05-11 (×5): 10 mL
  Administered 2014-05-12: 30 mL
  Administered 2014-05-12 – 2014-05-16 (×7): 10 mL
  Administered 2014-05-16: 30 mL
  Administered 2014-05-17: 10 mL
  Administered 2014-05-18: 30 mL
  Administered 2014-05-19: 10 mL

## 2014-05-08 MED ORDER — INSULIN ASPART 100 UNIT/ML ~~LOC~~ SOLN
0.0000 [IU] | SUBCUTANEOUS | Status: DC
  Administered 2014-05-08: 2 [IU] via SUBCUTANEOUS
  Administered 2014-05-08: 3 [IU] via SUBCUTANEOUS
  Administered 2014-05-08 – 2014-05-09 (×3): 2 [IU] via SUBCUTANEOUS
  Administered 2014-05-09: 1 [IU] via SUBCUTANEOUS
  Administered 2014-05-09: 5 [IU] via SUBCUTANEOUS
  Administered 2014-05-09 – 2014-05-10 (×2): 1 [IU] via SUBCUTANEOUS
  Administered 2014-05-10: 2 [IU] via SUBCUTANEOUS
  Administered 2014-05-10 – 2014-05-11 (×3): 1 [IU] via SUBCUTANEOUS
  Administered 2014-05-11: 2 [IU] via SUBCUTANEOUS
  Administered 2014-05-11: 1 [IU] via SUBCUTANEOUS
  Administered 2014-05-11: 2 [IU] via SUBCUTANEOUS
  Administered 2014-05-12: 1 [IU] via SUBCUTANEOUS
  Administered 2014-05-12: 2 [IU] via SUBCUTANEOUS
  Administered 2014-05-12 (×2): 1 [IU] via SUBCUTANEOUS
  Administered 2014-05-12: 2 [IU] via SUBCUTANEOUS

## 2014-05-08 MED ORDER — CHLORHEXIDINE GLUCONATE 0.12 % MT SOLN
15.0000 mL | Freq: Two times a day (BID) | OROMUCOSAL | Status: DC
  Administered 2014-05-08 – 2014-05-13 (×11): 15 mL via OROMUCOSAL
  Filled 2014-05-08 (×11): qty 15

## 2014-05-08 MED ORDER — FUROSEMIDE 10 MG/ML IJ SOLN: 80.0000 mg | Freq: Once | INTRAMUSCULAR | Status: AC

## 2014-05-08 MED ORDER — SODIUM CHLORIDE 0.9 % IJ SOLN
10.0000 mL | INTRAMUSCULAR | Status: DC | PRN
  Administered 2014-05-11 – 2014-05-19 (×5): 10 mL
  Administered 2014-05-19: 20 mL
  Administered 2014-05-20: 10 mL

## 2014-05-08 MED ORDER — SODIUM CHLORIDE 0.9 % IV SOLN
INTRAVENOUS | Status: DC
  Administered 2014-05-11 – 2014-05-16 (×2): via INTRAVENOUS

## 2014-05-08 MED ORDER — DEXTROSE 50 % IV SOLN
50.0000 mL | Freq: Once | INTRAVENOUS | Status: AC
  Administered 2014-05-08: 50 mL via INTRAVENOUS
  Filled 2014-05-08: qty 50

## 2014-05-08 MED ORDER — SODIUM CHLORIDE 0.9 % IV BOLUS (SEPSIS): 500.0000 mL | Freq: Once | INTRAVENOUS | Status: DC

## 2014-05-08 MED ORDER — BIOTENE DRY MOUTH MT LIQD
15.0000 mL | Freq: Four times a day (QID) | OROMUCOSAL | Status: DC
  Administered 2014-05-08 – 2014-05-14 (×24): 15 mL via OROMUCOSAL

## 2014-05-08 MED ORDER — DEXTROSE 5 % IV SOLN
500.0000 mg | Freq: Every day | INTRAVENOUS | Status: DC
  Administered 2014-05-08 – 2014-05-15 (×9): 500 mg via INTRAVENOUS
  Filled 2014-05-08 (×10): qty 500

## 2014-05-08 MED ORDER — SODIUM POLYSTYRENE SULFONATE 15 GM/60ML PO SUSP
30.0000 g | Freq: Once | ORAL | Status: AC
  Administered 2014-05-08: 30 g
  Filled 2014-05-08: qty 120

## 2014-05-08 MED ORDER — DEXTROSE 50 % IV SOLN
INTRAVENOUS | Status: AC
  Filled 2014-05-08: qty 50

## 2014-05-08 MED ORDER — VITAL HIGH PROTEIN PO LIQD
1000.0000 mL | ORAL | Status: DC
  Administered 2014-05-08 (×3): 1000 mL
  Filled 2014-05-08 (×2): qty 1000

## 2014-05-08 MED ORDER — FENTANYL CITRATE 0.05 MG/ML IJ SOLN
50.0000 ug | INTRAMUSCULAR | Status: DC | PRN
  Administered 2014-05-08: 50 ug via INTRAVENOUS
  Administered 2014-05-08: 100 ug via INTRAVENOUS
  Administered 2014-05-08 – 2014-05-11 (×5): 50 ug via INTRAVENOUS
  Filled 2014-05-08 (×5): qty 2

## 2014-05-08 MED ORDER — SODIUM CHLORIDE 0.9 % IV SOLN
INTRAVENOUS | Status: DC
  Administered 2014-05-08: 10:00:00 via INTRAVENOUS

## 2014-05-08 MED ORDER — FENTANYL CITRATE 0.05 MG/ML IJ SOLN
50.0000 ug | INTRAMUSCULAR | Status: DC | PRN
  Administered 2014-05-08: 50 ug via INTRAVENOUS
  Filled 2014-05-08: qty 2

## 2014-05-08 MED ORDER — PANTOPRAZOLE SODIUM 40 MG IV SOLR
40.0000 mg | Freq: Every day | INTRAVENOUS | Status: DC
  Administered 2014-05-08 – 2014-05-13 (×7): 40 mg via INTRAVENOUS
  Filled 2014-05-08 (×9): qty 40

## 2014-05-08 MED ORDER — INSULIN ASPART 100 UNIT/ML IV SOLN
10.0000 [IU] | Freq: Once | INTRAVENOUS | Status: AC
  Administered 2014-05-08: 10 [IU] via INTRAVENOUS

## 2014-05-08 MED ORDER — DOPAMINE-DEXTROSE 3.2-5 MG/ML-% IV SOLN
2.5000 ug/kg/min | INTRAVENOUS | Status: DC
  Administered 2014-05-08: 2.5 ug/kg/min via INTRAVENOUS
  Filled 2014-05-08: qty 250

## 2014-05-08 NOTE — Consult Note (Signed)
PULMONARY / CRITICAL CARE MEDICINE   Name: Jacqueline Reilly MRN: [id number] DOB: [birth year]    ADMISSION DATE:  05/07/2014 CONSULTATION DATE:  05/07/14  REFERRING MD :  Cards PRIMARY SERVICE: Cards  CHIEF COMPLAINT:  resp failure  BRIEF PATIENT DESCRIPTION: 80 yr old EF 25%, new fib, resp failure, failed NIMV. Edema vs PNA  SIGNIFICANT EVENTS / STUDIES:  7/11 admission, NEW fib rvr, worsening hypoxic resp failure, failed BIPAP, ETT placed  LINES / TUBES: 7/12 ETT>>> 7/11 rt ij>>>  CULTURES: 7/12 BC>>> 7/12 sputum>>>  ANTIBIOTICS: ceftraixone 7/12>>> Azithromycin 7/12>>>  HISTORY OF PRESENT ILLNESS:   This is a 80 y.o. female with a history of CAD, AAA and systolic CHF, last noted EF 25%. She came to the emergency room with a history of near syncope. She may have blacked out for a few seconds. In the emergency room, she was found to be in atrial fibrillation with rapid ventricular response and cardiology was asked to evaluate her.  She had no chest pain, shortness of breath and has never had palpitations. Found in ED with hypotension, Afib RVR, amio started. Admitted to floors, worsened with hypoxic resp failure, obviouse SOB, elevated WOB. NO leg pain described. Foley placed, lasix with some min response and low urine in bladder on bladder scan. D/w Son, limited code established, ET Tplaced after failing NIMV.  PAST MEDICAL HISTORY :  Past Medical History  Diagnosis Date  . PVD (peripheral vascular disease)   . Ventricular dysfunction     left; ischemic  . Hyperlipidemia   . AAA (abdominal aortic aneurysm)   . Renal artery stenosis   . Hypertension   . Coronary artery disease 2007    moderate ASCAD of the left system s/p PCI of the D2 and PCI of the left circ 03/2009  . CHF (congestive heart failure)    Past Surgical History  Procedure Laterality Date  . Retinal cryopexy      right eye (for retinal detachment)  . Angioplasty    . Coronary stent placement     Prior to  Admission medications   Medication Sig Start Date End Date Taking? Authorizing Provider  acetaminophen (TYLENOL) 500 MG tablet Take 500 mg by mouth every 6 (six) hours as needed (for pain).    Yes Historical Provider, MD  aspirin 81 MG chewable tablet Chew 81 mg by mouth daily.   Yes Historical Provider, MD  lisinopril (PRINIVIL,ZESTRIL) 5 MG tablet Take 5 mg by mouth daily.   Yes Historical Provider, MD  metoprolol succinate (TOPROL-XL) 25 MG 24 hr tablet Take 1 tablet (25 mg total) by mouth daily. 04/25/14  Yes Irven Shelling, MD  Multiple Vitamin (MULTIVITAMIN) tablet Take 1 tablet by mouth daily.   Yes Historical Provider, MD  nitroGLYCERIN (NITROSTAT) 0.4 MG SL tablet Place 0.4 mg under the tongue every 5 (five) minutes as needed for chest pain.   Yes Historical Provider, MD  Omega-3 Fatty Acids (FISH OIL) 1200 MG CAPS Take 1 capsule by mouth daily.   Yes Historical Provider, MD  potassium chloride SA (K-DUR,KLOR-CON) 20 MEQ tablet Take 20 mEq by mouth 2 (two) times daily.    Yes Historical Provider, MD  torsemide (DEMADEX) 20 MG tablet Take 40 mg by mouth 2 (two) times daily.   Yes Historical Provider, MD   Allergies  Allergen Reactions  . Codeine Nausea Only  . Garlic Nausea Only    FAMILY HISTORY:  Family History  Problem Relation Age of Onset  .  Heart disease Mother   . Heart disease Father   . Heart disease Brother     x 3   SOCIAL HISTORY:  reports that she has never smoked. She has never used smokeless tobacco. She reports that she drinks alcohol. She reports that she does not use illicit drugs.  REVIEW OF SYSTEMS:  Unable. Distress, nimv  SUBJECTIVE: SOB severe  VITAL SIGNS: Temp:  [98.1 F (36.7 C)-100.2 F (37.9 C)] 100.2 F (37.9 C) (07/11 1718) Pulse Rate:  [50-130] 78 (07/11 2249) Resp:  [16-45] 37 (07/11 2249) BP: (88-112)/(55-99) 110/64 mmHg (07/11 2249) SpO2:  [91 %-100 %] 100 % (07/11 2249) FiO2 (%):  [100 %] 100 % (07/11 2249) Weight:  [40.7 kg  (89 lb 11.6 oz)-41.731 kg (92 lb)] 40.7 kg (89 lb 11.6 oz) (07/11 1718) HEMODYNAMICS:   VENTILATOR SETTINGS: Vent Mode:  [-] BIPAP FiO2 (%):  [100 %] 100 % Set Rate:  [10 bmp] 10 bmp PEEP:  [6 cmH20] 6 cmH20 INTAKE / OUTPUT: Intake/Output     07/11 0701 - 07/12 0700   I.V. (mL/kg) 224.9 (5.5)   Total Intake(mL/kg) 224.9 (5.5)   Urine (mL/kg/hr) 150   Total Output 150   Net +74.9         PHYSICAL EXAMINATION: General:  Obvious severe resp distress Neuro:  Alert, cooperative, nonfocal HEENT:  jvd lower Cardiovascular:  s1 s2 RRR int brady, distant Lungs:  Coarse bases Abdomen:  bs hypo, NT, ND, scaffoid Musculoskeletal:  Low muscle mass Skin:  No rash  LABS:  CBC  Recent Labs Lab 05/07/14 0920 05/07/14 2058  WBC 11.3* 13.3*  HGB 14.9 16.8*  HCT 46.5* 53.4*  PLT 351 337   Coag's  Recent Labs Lab 05/07/14 1749  APTT 42*  INR 1.63*   BMET  Recent Labs Lab 05/05/14 1023 05/07/14 0920  NA 139 138  K 5.0 5.7*  CL 99 97  CO2 25 25  BUN 21 34*  CREATININE 0.76 0.86  GLUCOSE 87 179*   Electrolytes  Recent Labs Lab 05/05/14 1023 05/07/14 0920  CALCIUM 9.2 9.7   Sepsis Markers No results found for this basename: LATICACIDVEN, PROCALCITON, O2SATVEN,  in the last 168 hours ABG No results found for this basename: PHART, PCO2ART, PO2ART,  in the last 168 hours Liver Enzymes No results found for this basename: AST, ALT, ALKPHOS, BILITOT, ALBUMIN,  in the last 168 hours Cardiac Enzymes  Recent Labs Lab 05/07/14 0920 05/07/14 1625 05/07/14 2058  TROPONINI  --  1.59* 1.54*  PROBNP 15676.0*  --   --    Glucose No results found for this basename: GLUCAP,  in the last 168 hours  Imaging Dg Chest Port 1 View  05/07/2014   CLINICAL DATA:  Syncopal episode  EXAM: PORTABLE CHEST - 1 VIEW  COMPARISON:  04/23/2014  FINDINGS: Cardiac shadow remains enlarged. Diffuse fibrotic changes are again identified. No sizable effusion or focal infiltrate is noted.  Chronic changes about shoulder joints are seen.  IMPRESSION: Chronic changes without acute abnormality.   Electronically Signed   By: Inez Catalina M.D.   On: 05/07/2014 10:04     CXR: int prominence  ASSESSMENT / PLAN:  PULMONARY A: Acute hypoxic resp failure, r/o failure as primary issue, r/o PNA P:   After extensive d./w son and patient, she would accept short term intubation, no trach or long term support wished Place ETT 8 cc/kg, rate 14, 100%, peep 5, abg to follow pcxr now cvp important Doppler legs,  hypoxia ( new fib), may need ct chest  CARDIOVASCULAR A: Acute sys heart failure, New afib (now converted with amio), post intubation brady / shock, r/o new occult valvular dz, ischemia P:  tsh noted wnl With post intubation hemodynamics, dc amio for now and start dopamine for brady , shock to map 60 Cortisol cvp needed, IJ appeared small on [place], if cvp low, may need ct chest to better define int changes Hep drip Lactic acid  RENAL A:  Hyperkalemia P:   Intubate, avoid acidosis Stat bmet Lasix until cvp noted Strict I/O cvp important  GASTROINTESTINAL A:  NPO P:   Start TF early ppi addition lft follow up  HEMATOLOGIC A:  Polycythemia r/o overdiuresis P:  Cvp, lmay need to hold lasix Hep IV Cbc in am   INFECTIOUS A:  R/o PNA, now drop in BP P:   BC, sputum Pct algo to limit abx Start empiric abx, ceftriaxone, azithro  ENDOCRINE A:  R/o rel AI P:   Cortisol cbg  NEUROLOGIC A:  Vent dychrony P:   RASS goal: -1 Int fent  TODAY'S SUMMARY: ETT, cvp, lasix may need to be held, failed NIMV, limited code established, may need ct chest to better define etiology hypoxic resp failure, post intubation brady hold amio, dop add for now  I have personally obtained a history, examined the patient, evaluated laboratory and imaging results, formulated the assessment and plan and placed orders. CRITICAL CARE: The patient is critically ill with multiple organ  systems failure and requires high complexity decision making for assessment and support, frequent evaluation and titration of therapies, application of advanced monitoring technologies and extensive interpretation of multiple databases. Critical Care Time devoted to patient care services described in this note is 45 minutes.   Lavon Paganini. Titus Mould, MD, FACP Pgr: [facility]  [facility] [place] Pager: [phone]  05/08/2014, 12:17 AM

## 2014-05-08 NOTE — Progress Notes (Signed)
[facility] Progress Note Patient Name: Jacqueline Reilly DOB: 1923-10-31 MRN: [id number]  Date of Service  05/08/2014   HPI/Events of Note  Call from bedside nurse reporting abrupt change in rhythm to bradycardia with rates in the 40s and hypotension.  Earlier found to have potassium of 5.8 but also on amiodarone.   eICU Interventions  Plan: 1. Emergently treat hyperkalemia  2. Hold amio gtt 3. DA gtt already ordered 4 Kayexalate 5 Nurse to contact cardiology   Intervention Category Major Interventions: Arrhythmia - evaluation and management;Electrolyte abnormality - evaluation and management  DETERDING,ELIZABETH 05/08/2014, 4:32 AM

## 2014-05-08 NOTE — Progress Notes (Signed)
400 mg/40 mL propofol was wasted in sink with Dustin Folks, RN.  50 mg/1 mL fentanyl was wasted in sink with Dustin Folks, RN. 1 mg/1 mL versed was wasted in sink with Dustin Folks, RN.

## 2014-05-08 NOTE — Progress Notes (Signed)
Upon initial assessment pt was in clear respiratory distress.  Respiratory rate was sustaining in the 40's, breaths labored with accessory muscle use and skin was ashen, with extremities cool and clammy.  BP 114/57, nsr with rate 74-86, with O2 sats 93 when we can get a decent wave form.  Pt was lethargic but arousable and able to briefly answer questions appropriately before falling back to sleep. She complains of pain in her lower back but denies any other pain. Rapid Response RN and Respiratory Therapist were called to the bedside to evaluate and MD arrived shortly after.  IV Lasix given and foley inserted with an initial output of 169ml. BiPap initiated by RT and while O2 sats and pt color did improve, pt's respiratory rate remains in the 40's with continued increased work of breath.  MD put in order to transfer pt to ICU, (refer to his note in epic), and pt was transferred to 2S09 with her belongings.  Family remained at bedside throughout and was kept informed and questions were answered to their satisfaction.

## 2014-05-08 NOTE — Consult Note (Signed)
PULMONARY / CRITICAL CARE MEDICINE   Name: Jacqueline Reilly MRN: [id number] DOB: 1924-01-21    ADMISSION DATE:  05/07/2014 CONSULTATION DATE:  05/07/14  REFERRING MD :  Cards PRIMARY SERVICE: Cards  CHIEF COMPLAINT:  resp failure  BRIEF PATIENT DESCRIPTION: This is a 80 y.o. female with a history of CAD, AAA and systolic CHF, last noted EF 25%. She came to the emergency room with a history of near syncope. She may have blacked out for a few seconds. In the emergency room, she was found to be in atrial fibrillation with rapid ventricular response and cardiology was asked to evaluate her.  She had no chest pain, shortness of breath and has never had palpitations. Found in ED with hypotension, Afib RVR, amio started. Admitted to floors, worsened with hypoxic resp failure, obviouse SOB, elevated WOB. NO leg pain described. Foley placed, lasix with some min response and low urine in bladder on bladder scan. D/w Son, limited code established, ET Tplaced after failing NIMV.  LINES / TUBES: 7/12 ETT>>> 7/11 rt ij>>>  CULTURES: 7/12 BC>>> 7/12 sputum>>> 7/11 MRSA PCR  - negative 7/12 - resp virus pcr 7/12 -  Urine leg and urine strep   ANTIBIOTICS: ceftraixone 7/12>>> Azithromycin 7/12>>>    SIGNIFICANT EVENTS / STUDIES:  7/11 admission, NEW fib rvr, worsening hypoxic resp failure, failed BIPAP, ETT placed 05/08/14 - prelim: doppler negative for DVT, ECHO - LVEF 25% and diffusely down   SUBJECTIVE/OVERNIGHT/INTERVAL HX 05/08/14: non sustained v tach and bedside echo unofficial has low ef - . Convered from A Fib to NSS but having NSVTACh runs in setting of NSTEMI, low EF and high K Back on amio gtt per cards. Also on heparin and dopamine gtt per cards. Cards thinks prognosis is poor    VITAL SIGNS: Temp:  [97.3 F (36.3 C)-100.2 F (37.9 C)] 97.5 F (36.4 C) (07/12 1249) Pulse Rate:  [41-107] 102 (07/12 1400) Resp:  [12-50] 28 (07/12 1400) BP: (51-123)/(36-96) 112/73 mmHg (07/12  1400) SpO2:  [72 %-100 %] 98 % (07/12 1400) FiO2 (%):  [40 %-100 %] 40 % (07/12 1300) Weight:  [40.7 kg (89 lb 11.6 oz)-42.91 kg (94 lb 9.6 oz)] 42.91 kg (94 lb 9.6 oz) (07/12 0500) HEMODYNAMICS: CVP:  [2 mmHg-18 mmHg] 4 mmHg VENTILATOR SETTINGS: Vent Mode:  [-] PRVC FiO2 (%):  [40 %-100 %] 40 % Set Rate:  [10 bmp-18 bmp] 18 bmp Vt Set:  [360 mL] 360 mL PEEP:  [5 cmH20-6 cmH20] 5 cmH20 Pressure Support:  [10 cmH20] 10 cmH20 Plateau Pressure:  [14 cmH20-18 cmH20] 14 cmH20 INTAKE / OUTPUT: Intake/Output     07/11 0701 - 07/12 0700 07/12 0701 - 07/13 0700   I.V. (mL/kg) 1520.7 (35.4) 356.1 (8.3)   NG/GT 20 180   IV Piggyback 410    Total Intake(mL/kg) 1950.7 (45.5) 536.1 (12.5)   Urine (mL/kg/hr) 295 140 (0.4)   Total Output 295 140   Net +1655.7 +396.1          PHYSICAL EXAMINATION: General:  Looks critically ill.  Neuro:  Sedated  HEENT:  jvd lower. ETT +, Intubated Cardiovascular:  s1 s2 RRR int brady, distant Lungs:  Coarse bases Abdomen:  bs hypo, NT, ND, scaffoid Musculoskeletal:  Low muscle mass Skin:  No rash  LABS:  PULMONARY  Recent Labs Lab 05/08/14 0100 05/08/14 0507  PHART 7.235* 7.304*  PCO2ART 49.5* 35.9  PO2ART 312.0* 122.0*  HCO3 20.2 17.3*  TCO2 21.7 18.4  O2SAT 99.4 97.9  CBC  Recent Labs Lab 05/07/14 0920 05/07/14 2058 05/08/14 0155  HGB 14.9 16.8* 15.4*  HCT 46.5* 53.4* 49.4*  WBC 11.3* 13.3* 16.1*  PLT 351 337 291    COAGULATION  Recent Labs Lab 05/07/14 1749  INR 1.63*    CARDIAC   Recent Labs Lab 05/07/14 1625 05/07/14 2058 05/08/14 0155  TROPONINI 1.59* 1.54* 2.18*    Recent Labs Lab 05/07/14 0920  PROBNP 15676.0*     CHEMISTRY  Recent Labs Lab 05/05/14 1023 05/07/14 0920 05/08/14 0155 05/08/14 0839  NA 139 138 142 144  K 5.0 5.7* 5.8* 4.7  CL 99 97 102 103  CO2 25 25 18* 24  GLUCOSE 87 179* 164* 149*  BUN 21 34* 41* 43*  CREATININE 0.76 0.86 1.16* 1.10  CALCIUM 9.2 9.7 8.7 9.9  MG  --    --   --  2.5  PHOS  --   --   --  5.0*   Estimated Creatinine Clearance: 23.9 ml/min (by C-G formula based on Cr of 1.1).   LIVER  Recent Labs Lab 05/07/14 1749 05/08/14 0155  AST  --  61*  ALT  --  32  ALKPHOS  --  96  BILITOT  --  0.3  PROT  --  6.5  ALBUMIN  --  3.2*  INR 1.63*  --      INFECTIOUS  Recent Labs Lab 05/08/14 0135 05/08/14 0145  LATICACIDVEN 5.7*  --   PROCALCITON  --  0.42     ENDOCRINE CBG (last 3)   Recent Labs  05/08/14 0418 05/08/14 0755 05/08/14 1247  GLUCAP 237* 183* 112*         IMAGING x48h  Dg Chest Port 1 View  05/08/2014   CLINICAL DATA:  Endotracheal tube and central line placement.  EXAM: PORTABLE CHEST - 1 VIEW  COMPARISON:  05/07/2014  FINDINGS: Endotracheal tube placed with tip measuring 3.3 cm above the carina. Right central venous catheter placed with tip over the cavoatrial junction. No pneumothorax. Cardiac enlargement with pulmonary vascular congestion and diffuse perihilar opacities consistent with edema. Edema pattern is progressing since previous study. No blunting of costophrenic angles. No pneumothorax. Degenerative changes in the shoulders.  IMPRESSION: Appliances appear to be in satisfactory location. Cardiac enlargement with pulmonary vascular congestion and progressive edema since previous study.   Electronically Signed   By: Lucienne Capers M.D.   On: 05/08/2014 01:34   Dg Chest Port 1 View  05/07/2014   CLINICAL DATA:  Syncopal episode  EXAM: PORTABLE CHEST - 1 VIEW  COMPARISON:  04/23/2014  FINDINGS: Cardiac shadow remains enlarged. Diffuse fibrotic changes are again identified. No sizable effusion or focal infiltrate is noted. Chronic changes about shoulder joints are seen.  IMPRESSION: Chronic changes without acute abnormality.   Electronically Signed   By: Inez Catalina M.D.   On: 05/07/2014 10:04   Dg Abd Portable 1v  05/08/2014   CLINICAL DATA:  OG tube placement  EXAM: PORTABLE ABDOMEN - 1 VIEW   COMPARISON:  None.  FINDINGS: Enteric tube tip is localized to the left lower abdomen consistent with location in the distal stomach. Visualized bowel gas pattern is unremarkable. Cardiac enlargement with probable interstitial infiltration in the lungs.  IMPRESSION: Enteric tube tip is in the left abdomen consistent with location in the distal stomach.   Electronically Signed   By: Lucienne Capers M.D.   On: 05/08/2014 05:38       CXR: int prominence  ASSESSMENT /  PLAN:  PULMONARY A: Acute hypoxic resp failure, suspect due to acute on chronic systolic CHF and pulmonary edema. Doubt ILD  P:   Full vent support  (goal: short term intubation, no trach or long term support wished ) Depending on course CT chest to rule out ILD    CARDIOVASCULAR A: Acute sys heart failure, New afib (now converted with amio), NSTEMI (stress 1dec 2015 with apical ischemia   - on dopamine, heparin and amio gtt. ECHO with ef 25%. Having NS V tach. CVP 3    P:  Per cards:  Check scvo2 to help titrate dopamine Consider lasix   RENAL A:  Hyperkalemia, ersolved P:   monitored  GASTROINTESTINAL A:  NPO P:   Start TF early ppi addition lft follow up  HEMATOLOGIC A:  Polycythemia - mild ? Cause  P:  Hep IV Cbc in am   INFECTIOUS A:  R/o PNA,  P:    empiric abx, ceftriaxone, azithro Track pct Check resp virus pcr Check urine leg and strep  ENDOCRINE A:  R/o rel AI P:   Cortisol cbg  NEUROLOGIC A:  Vent dychrony P:   RASS goal: -1 Int fent   GLOBAL 05/08/14: Husband and son updated. Short term ventilation ok but they are really hoping for improvement. Son lives in [place] and sufferes viral cardiomyopathy. Husband functional; was first "whiteCounselling psychologist at [employer] in [phone] and he is very proud of it    The patient is critically ill with multiple organ systems failure and requires high complexity decision making for assessment and support, frequent evaluation and titration of  therapies, application of advanced monitoring technologies and extensive interpretation of multiple databases.   Critical Care Time devoted to patient care services described in this note is  35  Minutes.  Dr. Brand Males, M.D., [facility].C.P Pulmonary and Critical Care Medicine Staff Physician [facility] Pager: [phone], If no answer or between  15:00h - 7:00h: call [phone]  05/08/2014 3:42 PM

## 2014-05-08 NOTE — Progress Notes (Signed)
  Echocardiogram 2D Echocardiogram has been performed.  Jacqueline Reilly FRANCES 05/08/2014, 10:00 AM

## 2014-05-08 NOTE — Procedures (Signed)
Intubation Procedure Note Jacqueline Reilly [id number] 1924/06/10  Procedure: Intubation Indications: Respiratory insufficiency  Procedure Details Consent: Risks of procedure as well as the alternatives and risks of each were explained to the (patient/caregiver).  Consent for procedure obtained. Time Out: Verified patient identification, verified procedure, site/side was marked, verified correct patient position, special equipment/implants available, medications/allergies/relevent history reviewed, required imaging and test results available.  Performed  Maximum sterile technique was used including antiseptics, cap, gloves, gown, hand hygiene, mask and sheet.  MAC and 3    Evaluation Hemodynamic Status: BP stable throughout; O2 sats: stable throughout Patient's Current Condition: stable Complications: No apparent complications Patient did tolerate procedure well. Chest X-ray ordered to verify placement.  CXR: pending.   Raylene Miyamoto 05/08/2014

## 2014-05-08 NOTE — Progress Notes (Signed)
VASCULAR LAB PRELIMINARY  PRELIMINARY  PRELIMINARY  PRELIMINARY  Bilateral lower extremity venous duplex completed.    Preliminary report:  Bilateral:  No evidence of DVT, superficial thrombosis, or Baker's Cyst.   Sherrilyn Nairn, RVS 05/08/2014, 3:32 PM

## 2014-05-08 NOTE — Progress Notes (Signed)
Patient ID: Jacqueline Reilly, female   DOB: [birth year], 80 y.o.   MRN: [id number]    Subjective:  Bad morning  Intubated for respitory distress.  Hyperkalemic and bradycardic  Converted from rapid afib to NSR with runs of NSVT Son and husband at bedside   Objective:  Filed Vitals:   05/08/14 1100 05/08/14 1221 05/08/14 1235 05/08/14 1249  BP: 96/63 114/78 114/78   Pulse: 70 107 96   Temp:    97.5 F (36.4 C)  TempSrc:    Axillary  Resp: 21 27 28    Height:      Weight:      SpO2: 98% 98% 99%     Intake/Output from previous day:  Intake/Output Summary (Last 24 hours) at 05/08/14 1255 Last data filed at 05/08/14 1200  Gross per 24 hour  Intake 2051.17 ml  Output    365 ml  Net 1686.17 ml    Physical Exam: Alert  Intubated  Frail thin white female  HEENT: normal Neck supple with no adenopathy JVP normal no bruits no thyromegaly Lungs bilateral rales  Heart:  S1/S2 SEM  murmur, no rub, gallop or click PMI enlarged  Abdomen: benighn, BS positve, no tenderness, no AAA no bruit.  No HSM or HJR Distal pulses intact with no bruits No edema Neuro non-focal Skin warm and dry No muscular weakness   Lab Results: Basic Metabolic Panel:  Recent Labs  05/08/14 0155 05/08/14 0839  NA 142 144  K 5.8* 4.7  CL 102 103  CO2 18* 24  GLUCOSE 164* 149*  BUN 41* 43*  CREATININE 1.16* 1.10  CALCIUM 8.7 9.9  MG  --  2.5  PHOS  --  5.0*   Liver Function Tests:  Recent Labs  05/08/14 0155  AST 61*  ALT 32  ALKPHOS 96  BILITOT 0.3  PROT 6.5  ALBUMIN 3.2*   CBC:  Recent Labs  05/07/14 2058 05/08/14 0155  WBC 13.3* 16.1*  HGB 16.8* 15.4*  HCT 53.4* 49.4*  MCV 87.4 87.6  PLT 337 291   Cardiac Enzymes:  Recent Labs  05/07/14 1625 05/07/14 2058 05/08/14 0155  TROPONINI 1.59* 1.54* 2.18*   Hemoglobin A1C:  Recent Labs  05/07/14 1625  HGBA1C 6.2*   Fasting Lipid Panel:  Recent Labs  05/08/14 0155  CHOL 217*  HDL 69  LDLCALC 131*  TRIG 87    CHOLHDL 3.1   Thyroid Function Tests:  Recent Labs  05/07/14 1625  TSH 1.040    Imaging: Dg Chest Port 1 View  05/08/2014   CLINICAL DATA:  Endotracheal tube and central line placement.  EXAM: PORTABLE CHEST - 1 VIEW  COMPARISON:  05/07/2014  FINDINGS: Endotracheal tube placed with tip measuring 3.3 cm above the carina. Right central venous catheter placed with tip over the cavoatrial junction. No pneumothorax. Cardiac enlargement with pulmonary vascular congestion and diffuse perihilar opacities consistent with edema. Edema pattern is progressing since previous study. No blunting of costophrenic angles. No pneumothorax. Degenerative changes in the shoulders.  IMPRESSION: Appliances appear to be in satisfactory location. Cardiac enlargement with pulmonary vascular congestion and progressive edema since previous study.   Electronically Signed   By: Lucienne Capers M.D.   On: 05/08/2014 01:34   Dg Chest Port 1 View  05/07/2014   CLINICAL DATA:  Syncopal episode  EXAM: PORTABLE CHEST - 1 VIEW  COMPARISON:  04/23/2014  FINDINGS: Cardiac shadow remains enlarged. Diffuse fibrotic changes are again identified. No sizable effusion or focal infiltrate  is noted. Chronic changes about shoulder joints are seen.  IMPRESSION: Chronic changes without acute abnormality.   Electronically Signed   By: Inez Catalina M.D.   On: 05/07/2014 10:04   Dg Abd Portable 1v  05/08/2014   CLINICAL DATA:  OG tube placement  EXAM: PORTABLE ABDOMEN - 1 VIEW  COMPARISON:  None.  FINDINGS: Enteric tube tip is localized to the left lower abdomen consistent with location in the distal stomach. Visualized bowel gas pattern is unremarkable. Cardiac enlargement with probable interstitial infiltration in the lungs.  IMPRESSION: Enteric tube tip is in the left abdomen consistent with location in the distal stomach.   Electronically Signed   By: Lucienne Capers M.D.   On: 05/08/2014 05:38    Cardiac Studies:  ECG:   SR LAD LBBB   PVC;s PR 212   Telemetry:  SR NSVT  Echo: EF 20-25%  Mild MR  High sphericity index low output   Medications:   . antiseptic oral rinse  15 mL Mouth Rinse QID  . aspirin  81 mg Oral Daily  . azithromycin  500 mg Intravenous QHS  . cefTRIAXone (ROCEPHIN)  IV  1 g Intravenous QHS  . chlorhexidine  15 mL Mouth Rinse BID  . feeding supplement (VITAL HIGH PROTEIN)  1,000 mL Per Tube Q24H  . insulin aspart  0-9 Units Subcutaneous 6 times per day  . multivitamin with minerals  1 tablet Oral Daily  . omega-3 acid ethyl esters  1 g Oral Daily  . pantoprazole (PROTONIX) IV  40 mg Intravenous QHS  . sodium chloride  3 mL Intravenous Q12H     . sodium chloride 50 mL/hr at 05/08/14 1200  . amiodarone Stopped (05/08/14 0430)  . DOPamine 6.028 mcg/kg/min (05/08/14 1200)  . heparin 650 Units/hr (05/08/14 1200)    Assessment/Plan:  CHF:  Possibly ppt by rapid afib and SEMI.  Prognosis is poor.  Reviewed notes from KO and DB  EF disproportionately low but has CAD With abnormal myovue 12/14 to my review with significant anteroapical ischemia.  CVP only 2 but low output on echo and worsening pulmonary Edema on CXR.  Continue vent support Got 80 mg lasix when K was high.  On dopamine.  Long discussion with family  Will resume amiodarone For salvos of [place] in setting of positive troponin and severe decreased EF.  Bradycardia likely more related to hyperkalemia which has been Rx And improved.  DB;s last note inidicated possible cath if she does poorly  She seems more of a palliative care person to me.  Continue ASA And heparin.  Vent management and antibiotics per CCM.  Long discussion with son and husband They understand how sick she is  Jenkins Rouge 05/08/2014, 12:55 PM

## 2014-05-08 NOTE — Significant Event (Signed)
Rapid Response Event Note  Overview: Time Called: 1938 Arrival Time: 1940 Event Type: Respiratory  Initial Focused Assessment:  Called by Stormie, RN with concerns for increasing respiratory distress.  Pt admitted from ED at 1640 hrs for Atrial fib with RVR on Amiodarone gtt. Converted to SR upon arrival to unit. Anxious, requiring increasing Oxygen, currently on NRMask at 15. Had received Morphine 1 mg IV for back pain and  And Xanax .25 mg for anxiety.  Upon arrival to bedside pt sitting in bed, oriented, able to answer one word responses, diaphoretic, tachypnic 47 labored  RR with use of accessory muscles and increased WOB, + JVD.  "Can't breathe"  Bilateral breath sounds with crackles throughout.   Denies CP but c/o low back pain. BLE cool, palpable pedal pulses, no edema bilat. 100.2  MOnitor 78 SR remains on Amiodarone gtt   96% on 15 l NRmask .  Family at bedside.  Dr Tommi Rumps at bedside discussing [place] with family. Labs:  Trop 1.03,1.59   WBC 11.3  BNP 15676,  K+ 5.7 Venous BG : 7.16   CO2 73.2    Interventions:  Bipap, ABG, Lasix 80 mg, Foley catheter per Dr Shanda Bumps orders.  [facility] at [facility]  F/U  At  2130    Pt remains tachypnic with RR 40-50 on BIpap still with increased WOB but improving slightly  ABG:  7.26  Pco2 48  PO2 168  Little response from lasix -150 cc post insertion. Dr Tommi Rumps updated family and decision to transfer to ICU was agreed upon.  Pt remains full code. 2200 VS:  105/64  79 SR  45RR  Bipap  99%  Transferred to 2S09 at 2215  Event Summary: Name of Physician Notified: Dr Tommi Rumps at [place]    at    Outcome: Transferred (Comment) (To 2S09)  Event End Time: 2215  Ester Rink

## 2014-05-08 NOTE — Progress Notes (Signed)
At approximately 0420 the patient went into a slow ventricular rhythm with a wide QRS complex. The lowest seen HR was 38.  The patient maintained consciousness throughout the event.  The Dr. Jimmy Footman and Dr Tommi Rumps were notified. Dr. Jimmy Footman ordered calcium, D50 and insulin, and hold amio and administer kayexalate.  Once OG tube is verified kayexalate will be administered. Dr. Luiz Ochoa ordered a 12-Lead EKG. The EKG was obtained and Dr. Luiz Ochoa assessed the patient at bedside.  Will continue to monitor the patient carefully.  Allen Derry, RN

## 2014-05-08 NOTE — Procedures (Signed)
Central Venous Catheter Insertion Procedure Note Jacqueline Reilly [id number] February 18, 1924  Procedure: Insertion of Central Venous Catheter Indications: Assessment of intravascular volume, Drug and/or fluid administration and Frequent blood sampling  Procedure Details Consent: Risks of procedure as well as the alternatives and risks of each were explained to the (patient/caregiver).  Consent for procedure obtained. Time Out: Verified patient identification, verified procedure, site/side was marked, verified correct patient position, special equipment/implants available, medications/allergies/relevent history reviewed, required imaging and test results available.  Performed  Maximum sterile technique was used including antiseptics, cap, gloves, gown, hand hygiene, mask and sheet. Skin prep: Chlorhexidine; local anesthetic administered A antimicrobial bonded/coated triple lumen catheter was placed in the right internal jugular vein using the Seldinger technique.  Evaluation Blood flow good Complications: No apparent complications Patient did tolerate procedure well. Chest X-ray ordered to verify placement.  CXR: pending.  Jacqueline Reilly 05/08/2014, 12:48 AM  [place] cvp Access  Jacqueline Reilly. Titus Mould, MD, [place] Pgr: [facility]

## 2014-05-08 NOTE — Progress Notes (Addendum)
ANTICOAGULATION CONSULT NOTE - Follow Up Consult  Pharmacy Consult for Heparin Indication: chest pain/ACS  Allergies  Allergen Reactions  . Codeine Nausea Only  . Garlic Nausea Only   Patient Measurements: Height: 5' (152.4 cm) Weight: 94 lb 9.6 oz (42.91 kg) IBW/kg (Calculated) : 45.5 Heparin Dosing Weight: 42kg  Vital Signs: Temp: 97.5 F (36.4 C) (07/12 0758) Temp src: Axillary (07/12 0758) BP: 107/63 mmHg (07/12 1000) Pulse Rate: 79 (07/12 1000)  Labs:  Recent Labs  05/07/14 0920 05/07/14 1625 05/07/14 1749 05/07/14 2058 05/08/14 0155 05/08/14 0805 05/08/14 0839  HGB 14.9  --   --  16.8* 15.4*  --   --   HCT 46.5*  --   --  53.4* 49.4*  --   --   PLT 351  --   --  337 291  --   --   APTT  --   --  42*  --   --   --   --   LABPROT  --   --  19.3*  --   --   --   --   INR  --   --  1.63*  --   --   --   --   HEPARINUNFRC  --   --   --  <0.10*  --  0.39  --   CREATININE 0.86  --   --   --  1.16*  --  1.10  TROPONINI  --  1.59*  --  1.54* 2.18*  --   --    Estimated Creatinine Clearance: 23.9 ml/min (by C-G formula based on Cr of 1.1).  Assessment: 80yo female presents with near syncopal episode according to son.  She was found to be in Afib with RVR in the ED. Her troponins were elevated as well as a significant elevation in her BNP.  She was started on IV heparin while her workup is ongoing. Noted first level was low- after a rate increase there was a pause in the heparin drip d/t need to place a central line. A level was checked ~7 hours after resumption and was therapeutic at 0.39 units/mL. CBC is stable, no bleeding noted.  Goal of Therapy:  Heparin level 0.3-0.7 units/ml Monitor platelets by anticoagulation protocol: Yes   Plan:  1. Continue heparin at 650 units/hr 2. Confirmatory level at 1600 today 3. Daily HL and CBC 4. Follow up plans for long term AC  Blasa Raisch D. Marrah Vanevery, PharmD, BCPS Clinical Pharmacist Pager: [phone] 05/08/2014 10:31  AM   ADDENDUM Confirmatory level this afternoon therapeutic at 0.41 units/mL.  Continue heparin at 650units/hr. Daily HL and CBC  Teonna Coonan D. Abenezer Odonell, PharmD, BCPS Clinical Pharmacist Pager: [phone] 05/08/2014 5:37 PM

## 2014-05-09 ENCOUNTER — Inpatient Hospital Stay (HOSPITAL_COMMUNITY): Payer: Medicare Other

## 2014-05-09 DIAGNOSIS — I4891 Unspecified atrial fibrillation: Principal | ICD-10-CM

## 2014-05-09 DIAGNOSIS — I5043 Acute on chronic combined systolic (congestive) and diastolic (congestive) heart failure: Secondary | ICD-10-CM

## 2014-05-09 LAB — CBC WITH DIFFERENTIAL/PLATELET
Basophils Absolute: 0 10*3/uL (ref 0.0–0.1)
Basophils Relative: 0 % (ref 0–1)
Eosinophils Absolute: 0 10*3/uL (ref 0.0–0.7)
Eosinophils Relative: 0 % (ref 0–5)
HCT: 37.6 % (ref 36.0–46.0)
Hemoglobin: 11.9 g/dL — ABNORMAL LOW (ref 12.0–15.0)
Lymphocytes Relative: 10 % — ABNORMAL LOW (ref 12–46)
Lymphs Abs: 1.2 10*3/uL (ref 0.7–4.0)
MCH: 27.2 pg (ref 26.0–34.0)
MCHC: 31.6 g/dL (ref 30.0–36.0)
MCV: 85.8 fL (ref 78.0–100.0)
Monocytes Absolute: 1.1 10*3/uL — ABNORMAL HIGH (ref 0.1–1.0)
Monocytes Relative: 9 % (ref 3–12)
Neutro Abs: 9.8 10*3/uL — ABNORMAL HIGH (ref 1.7–7.7)
Neutrophils Relative %: 81 % — ABNORMAL HIGH (ref 43–77)
Platelets: 219 10*3/uL (ref 150–400)
RBC: 4.38 MIL/uL (ref 3.87–5.11)
RDW: 14.7 % (ref 11.5–15.5)
WBC: 12.2 10*3/uL — ABNORMAL HIGH (ref 4.0–10.5)

## 2014-05-09 LAB — GLUCOSE, CAPILLARY
Glucose-Capillary: 109 mg/dL — ABNORMAL HIGH (ref 70–99)
Glucose-Capillary: 135 mg/dL — ABNORMAL HIGH (ref 70–99)
Glucose-Capillary: 90 mg/dL (ref 70–99)
Glucose-Capillary: 153 mg/dL — ABNORMAL HIGH (ref 70–99)
Glucose-Capillary: 178 mg/dL — ABNORMAL HIGH (ref 70–99)
Glucose-Capillary: 146 mg/dL — ABNORMAL HIGH (ref 70–99)

## 2014-05-09 LAB — BASIC METABOLIC PANEL
Anion gap: 12 (ref 5–15)
Anion gap: 12 (ref 5–15)
BUN: 47 mg/dL — ABNORMAL HIGH (ref 6–23)
BUN: 48 mg/dL — ABNORMAL HIGH (ref 6–23)
CO2: 29 mEq/L (ref 19–32)
CO2: 27 mEq/L (ref 19–32)
Calcium: 8.5 mg/dL (ref 8.4–10.5)
Calcium: 8.7 mg/dL (ref 8.4–10.5)
Chloride: 103 mEq/L (ref 96–112)
Chloride: 105 mEq/L (ref 96–112)
Creatinine, Ser: 0.89 mg/dL (ref 0.50–1.10)
Creatinine, Ser: 0.8 mg/dL (ref 0.50–1.10)
GFR calc Af Amer: 65 mL/min — ABNORMAL LOW (ref >90)
GFR calc Af Amer: 74 mL/min — ABNORMAL LOW (ref >90)
GFR calc non Af Amer: 56 mL/min — ABNORMAL LOW (ref >90)
GFR calc non Af Amer: 64 mL/min — ABNORMAL LOW (ref >90)
Glucose, Bld: 127 mg/dL — ABNORMAL HIGH (ref 70–99)
Glucose, Bld: 175 mg/dL — ABNORMAL HIGH (ref 70–99)
Potassium: 3.1 mEq/L — ABNORMAL LOW (ref 3.7–5.3)
Potassium: 5.4 mEq/L — ABNORMAL HIGH (ref 3.7–5.3)
Sodium: 144 mEq/L (ref 137–147)
Sodium: 144 mEq/L (ref 137–147)

## 2014-05-09 LAB — PRO B NATRIURETIC PEPTIDE: Pro B Natriuretic peptide (BNP): 23926 pg/mL — ABNORMAL HIGH (ref 0–450)

## 2014-05-09 LAB — LACTIC ACID, PLASMA: Lactic Acid, Venous: 1 mmol/L (ref 0.5–2.2)

## 2014-05-09 LAB — POCT I-STAT 3, VENOUS BLOOD GAS (G3P V)
Acid-base deficit: 5 mmol/L — ABNORMAL HIGH (ref 0.0–2.0)
Bicarbonate: 25.7 mEq/L — ABNORMAL HIGH (ref 20.0–24.0)
O2 Saturation: 27 %
Patient temperature: 100.4
TCO2: 28 mmol/L (ref 0–100)
pCO2, Ven: 73.2 mmHg (ref 45.0–50.0)
pH, Ven: 7.16 — CL (ref 7.250–7.300)
pO2, Ven: 25 mmHg — CL (ref 30.0–45.0)

## 2014-05-09 LAB — POCT I-STAT 3, ART BLOOD GAS (G3+)
Acid-base deficit: 5 mmol/L — ABNORMAL HIGH (ref 0.0–2.0)
Bicarbonate: 21.8 mEq/L (ref 20.0–24.0)
O2 Saturation: 99 %
Patient temperature: 100.4
TCO2: 23 mmol/L (ref 0–100)
pCO2 arterial: 48.3 mmHg — ABNORMAL HIGH (ref 35.0–45.0)
pH, Arterial: 7.269 — ABNORMAL LOW (ref 7.350–7.450)
pO2, Arterial: 168 mmHg — ABNORMAL HIGH (ref 80.0–100.0)

## 2014-05-09 LAB — PROCALCITONIN: Procalcitonin: 0.41 ng/mL

## 2014-05-09 LAB — POCT I-STAT, CHEM 8
BUN: 38 mg/dL — ABNORMAL HIGH (ref 6–23)
Calcium, Ion: 1 mmol/L — ABNORMAL LOW (ref 1.13–1.30)
Chloride: 110 mEq/L (ref 96–112)
Creatinine, Ser: 1.1 mg/dL (ref 0.50–1.10)
Glucose, Bld: 159 mg/dL — ABNORMAL HIGH (ref 70–99)
HCT: 55 % — ABNORMAL HIGH (ref 36.0–46.0)
Hemoglobin: 18.7 g/dL — ABNORMAL HIGH (ref 12.0–15.0)
Potassium: 5.8 mEq/L — ABNORMAL HIGH (ref 3.7–5.3)
Sodium: 137 mEq/L (ref 137–147)
TCO2: 19 mmol/L (ref 0–100)

## 2014-05-09 LAB — LEGIONELLA ANTIGEN, URINE: Legionella Antigen, Urine: NEGATIVE

## 2014-05-09 LAB — CARBOXYHEMOGLOBIN
Carboxyhemoglobin: 0.6 % (ref 0.5–1.5)
Methemoglobin: 0.7 % (ref 0.0–1.5)
O2 Saturation: 29.1 %
Total hemoglobin: 13.9 g/dL (ref 12.0–16.0)

## 2014-05-09 LAB — PHOSPHORUS: Phosphorus: 3.4 mg/dL (ref 2.3–4.6)

## 2014-05-09 LAB — MAGNESIUM: Magnesium: 2.3 mg/dL (ref 1.5–2.5)

## 2014-05-09 LAB — HEPARIN LEVEL (UNFRACTIONATED): Heparin Unfractionated: 0.3 IU/mL (ref 0.30–0.70)

## 2014-05-09 MED ORDER — POTASSIUM CHLORIDE 20 MEQ/15ML (10%) PO LIQD
40.0000 meq | Freq: Once | ORAL | Status: AC
  Administered 2014-05-09: 40 meq
  Filled 2014-05-09: qty 30

## 2014-05-09 MED ORDER — FUROSEMIDE 10 MG/ML IJ SOLN
40.0000 mg | Freq: Two times a day (BID) | INTRAMUSCULAR | Status: DC
  Administered 2014-05-09: 40 mg via INTRAVENOUS
  Filled 2014-05-09: qty 4

## 2014-05-09 MED ORDER — FUROSEMIDE 10 MG/ML IJ SOLN
40.0000 mg | Freq: Two times a day (BID) | INTRAMUSCULAR | Status: DC
  Administered 2014-05-09: 40 mg via INTRAVENOUS
  Filled 2014-05-09 (×3): qty 4

## 2014-05-09 MED ORDER — SODIUM CHLORIDE 0.9 % IV SOLN
1.0000 g | Freq: Once | INTRAVENOUS | Status: DC
  Filled 2014-05-09: qty 10

## 2014-05-09 MED ORDER — DEXTROSE 5 % IV SOLN
2.0000 ug/min | INTRAVENOUS | Status: DC
  Administered 2014-05-09: 5 ug/min via INTRAVENOUS
  Administered 2014-05-10 – 2014-05-11 (×2): 2 ug/min via INTRAVENOUS
  Filled 2014-05-09 (×3): qty 4

## 2014-05-09 MED ORDER — VITAL AF 1.2 CAL PO LIQD
1000.0000 mL | ORAL | Status: DC
  Administered 2014-05-09 – 2014-05-10 (×2): 1000 mL
  Filled 2014-05-09 (×8): qty 1000

## 2014-05-09 MED ORDER — VITAL HIGH PROTEIN PO LIQD
1000.0000 mL | ORAL | Status: DC
  Administered 2014-05-09: 1000 mL
  Filled 2014-05-09 (×2): qty 1000

## 2014-05-09 MED ORDER — POTASSIUM CHLORIDE 10 MEQ/50ML IV SOLN
10.0000 meq | INTRAVENOUS | Status: AC
  Administered 2014-05-09 (×4): 10 meq via INTRAVENOUS
  Filled 2014-05-09 (×4): qty 50

## 2014-05-09 NOTE — Progress Notes (Signed)
ANTICOAGULATION CONSULT NOTE - Follow Up Consult  Pharmacy Consult for Heparin Indication: chest pain/ACS  Allergies  Allergen Reactions  . Codeine Nausea Only  . Garlic Nausea Only    Labs:  Recent Labs  05/07/14 1625 05/07/14 1749  05/07/14 2058 05/08/14 0155 05/08/14 0805 05/08/14 0839 05/08/14 1536 05/09/14 0430  HGB  --   --   --  16.8* 15.4*  --   --   --  11.9*  HCT  --   --   --  53.4* 49.4*  --   --   --  37.6  PLT  --   --   --  337 291  --   --   --  219  APTT  --  42*  --   --   --   --   --   --   --   LABPROT  --  19.3*  --   --   --   --   --   --   --   INR  --  1.63*  --   --   --   --   --   --   --   HEPARINUNFRC  --   --   < > <0.10*  --  0.39  --  0.41 0.30  CREATININE  --   --   --   --  1.16*  --  1.10  --  0.89  TROPONINI 1.59*  --   --  1.54* 2.18*  --   --   --   --   < > = values in this interval not displayed. Estimated Creatinine Clearance: 30.1 ml/min (by C-G formula based on Cr of 0.89).  Assessment: 80yo female presents with near syncopal episode according to son.  She was found to be in Afib with RVR in the ED. Her troponins were elevated as well as a significant elevation in her BNP.    Heparin level therapeutic CBC trending down  Goal of Therapy:  Heparin level 0.3-0.7 units/ml Monitor platelets by anticoagulation protocol: Yes   Plan:  Continue heparin at 650 units / hr Daily heparin level, CBC  Thank you. Anette Guarneri, PharmD [phone]  05/09/2014 9:20 AM

## 2014-05-09 NOTE — Progress Notes (Signed)
Advanced Heart Failure Rounding Note   Subjective:   [name] is an 80 y/o woman with CAD, AAA, systolic HF EF [id number], dementia, and CAD and has had two previous coronary interventions including a cutting balloon to her D2 in 2007 and a DES to her mid LCX in 2010. Her LV dysfunction has been out of proportion to her CAD.    Admitted 05/07/14 with syncope and dyspnea. She had new onset A-fib RVR. Troponin was 1.03>  1.59> 2.18  .   She was placed on amiodarone and heparin drip. She developed respiratory distress and required intubation. She converted to NSR but later was bradycardiac with NSVT. Amiodarone temporarily stopped but restarted after hyperkalemia resolved. Hyperkalemia was thought to be contributing to bradycardia.  Dopamine later added for hypotension. Diuresed with IV lasix.  Weight up 2 pounds.   Remains intubated but follows commands.   Lower extremity doppler- negative for DVT  CO-OX 69%     Objective:   Weight Range:  Vital Signs:   Temp:  [97.4 F (36.3 C)-99.2 F (37.3 C)] 97.8 F (36.6 C) (07/13 0802) Pulse Rate:  [66-107] 78 (07/13 0900) Resp:  [19-30] 21 (07/13 0900) BP: (84-118)/(53-88) 94/69 mmHg (07/13 0900) SpO2:  [96 %-100 %] 99 % (07/13 0900) FiO2 (%):  [40 %] 40 % (07/13 0800) Weight:  [96 lb 5.5 oz (43.7 kg)] 96 lb 5.5 oz (43.7 kg) (07/13 0400)    Weight change: Filed Weights   05/07/14 1718 05/08/14 0500 05/09/14 0400  Weight: 89 lb 11.6 oz (40.7 kg) 94 lb 9.6 oz (42.91 kg) 96 lb 5.5 oz (43.7 kg)    Intake/Output:   Intake/Output Summary (Last 24 hours) at 05/09/14 1010 Last data filed at 05/09/14 0900  Gross per 24 hour  Intake 2711.5 ml  Output    955 ml  Net 1756.5 ml     Physical Exam: General:  Intubated. Awake  HEENT: normal Neck: supple. JVP 7-8. Carotids 2+ bilat; no bruits. No lymphadenopathy or thryomegaly appreciated. Cor: PMI nondisplaced. Regular rate & rhythm. No rubs, gallops or murmurs. Lungs: Rhonchi  throughout Abdomen: soft, nontender, nondistended. No hepatosplenomegaly. No bruits or masses. Good bowel sounds. Extremities: no cyanosis, clubbing, rash, edema Neuro: Intubated but following commands on vent.  moves all 4 extremities w/o difficulty.   Telemetry: SR 80s PVCs   Labs: Basic Metabolic Panel:  Recent Labs Lab 05/05/14 1023 05/07/14 0920 05/07/14 2054 05/08/14 0155 05/08/14 0839 05/09/14 0430  NA 139 138 137 142 144 144  K 5.0 5.7* 5.8* 5.8* 4.7 3.1*  CL 99 97 110 102 103 103  CO2 25 25  --  18* 24 29  GLUCOSE 87 179* 159* 164* 149* 127*  BUN 21 34* 38* 41* 43* 47*  CREATININE 0.76 0.86 1.10 1.16* 1.10 0.89  CALCIUM 9.2 9.7  --  8.7 9.9 8.5  MG  --   --   --   --  2.5 2.3  PHOS  --   --   --   --  5.0* 3.4    Liver Function Tests:  Recent Labs Lab 05/08/14 0155  AST 61*  ALT 32  ALKPHOS 96  BILITOT 0.3  PROT 6.5  ALBUMIN 3.2*   No results found for this basename: LIPASE, AMYLASE,  in the last 168 hours No results found for this basename: AMMONIA,  in the last 168 hours  CBC:  Recent Labs Lab 05/07/14 0920 05/07/14 2054 05/07/14 2058 05/08/14 0155 05/09/14 0430  WBC 11.3*  --  13.3* 16.1* 12.2*  NEUTROABS  --   --   --   --  9.8*  HGB 14.9 18.7* 16.8* 15.4* 11.9*  HCT 46.5* 55.0* 53.4* 49.4* 37.6  MCV 86.4  --  87.4 87.6 85.8  PLT 351  --  337 291 219    Cardiac Enzymes:  Recent Labs Lab 05/07/14 1625 05/07/14 2058 05/08/14 0155  TROPONINI 1.59* 1.54* 2.18*    BNP: BNP (last 3 results)  Recent Labs  04/28/14 1107 05/07/14 0920 05/09/14 0430  PROBNP 15377.0* 15676.0* 23926.0*     Other results:  :   Imaging: Dg Chest Port 1 View  05/09/2014   CLINICAL DATA:  Respiratory failure.  EXAM: PORTABLE CHEST - 1 VIEW  COMPARISON:  05/08/2014  FINDINGS: Endotracheal tube tip is approximately 2.5 cm above the carina. There has been interval placement of a gastric decompression tube with the tip extending into the stomach.  Lungs showed interval worsening of pulmonary edema. There is stable cardiomegaly. No significant pleural fluid identified. No pneumothorax.  IMPRESSION: Worsening of pulmonary edema.   Electronically Signed   By: Aletta Edouard M.D.   On: 05/09/2014 07:39   Dg Chest Port 1 View  05/08/2014   CLINICAL DATA:  Endotracheal tube and central line placement.  EXAM: PORTABLE CHEST - 1 VIEW  COMPARISON:  05/07/2014  FINDINGS: Endotracheal tube placed with tip measuring 3.3 cm above the carina. Right central venous catheter placed with tip over the cavoatrial junction. No pneumothorax. Cardiac enlargement with pulmonary vascular congestion and diffuse perihilar opacities consistent with edema. Edema pattern is progressing since previous study. No blunting of costophrenic angles. No pneumothorax. Degenerative changes in the shoulders.  IMPRESSION: Appliances appear to be in satisfactory location. Cardiac enlargement with pulmonary vascular congestion and progressive edema since previous study.   Electronically Signed   By: Lucienne Capers M.D.   On: 05/08/2014 01:34   Dg Abd Portable 1v  05/08/2014   CLINICAL DATA:  OG tube placement  EXAM: PORTABLE ABDOMEN - 1 VIEW  COMPARISON:  None.  FINDINGS: Enteric tube tip is localized to the left lower abdomen consistent with location in the distal stomach. Visualized bowel gas pattern is unremarkable. Cardiac enlargement with probable interstitial infiltration in the lungs.  IMPRESSION: Enteric tube tip is in the left abdomen consistent with location in the distal stomach.   Electronically Signed   By: Lucienne Capers M.D.   On: 05/08/2014 05:38      Medications:     Scheduled Medications: . antiseptic oral rinse  15 mL Mouth Rinse QID  . aspirin  81 mg Oral Daily  . azithromycin  500 mg Intravenous QHS  . cefTRIAXone (ROCEPHIN)  IV  1 g Intravenous QHS  . chlorhexidine  15 mL Mouth Rinse BID  . feeding supplement (VITAL HIGH PROTEIN)  1,000 mL Per Tube Q24H   . furosemide  40 mg Intravenous Q12H  . insulin aspart  0-9 Units Subcutaneous 6 times per day  . multivitamin with minerals  1 tablet Oral Daily  . pantoprazole (PROTONIX) IV  40 mg Intravenous QHS  . potassium chloride  10 mEq Intravenous Q1 Hr x 4  . potassium chloride  40 mEq Per Tube Once  . sodium chloride  10-40 mL Intracatheter Q12H  . sodium chloride  3 mL Intravenous Q12H     Infusions: . sodium chloride 10 mL/hr at 05/09/14 0900  . amiodarone 30 mg/hr (05/09/14 0900)  . heparin 650  Units/hr (05/09/14 0900)  . norepinephrine (LEVOPHED) Adult infusion       PRN Medications:  sodium chloride, fentaNYL, fentaNYL, ondansetron (ZOFRAN) IV, sodium chloride, sodium chloride   Assessment:  1. A fib RVR 2. Syncope 3. NSTEMI 4. Acute Respiratory Failure- Intubated  5. Acute/Chronic Systolic Heart Failure: EF 25-30%, LV dysfunction has been out of proportion to CAD.  6. Hyperkalemia  7. Early Dementia 8. Hypokalemia 9. CAD 10. ?PNA: on antibiotics.  11. Limited Code- No cpr . No defibrillation   Plan/Discussion:    CCM following. Recommendations appreciated.   Maintaining NSR. Continue amiodarone drip and Heparin. Lower extremity doppler negative.   Volume status ok. Restart IV Lasix. Renal function ok   K elevated, as above to restart IV Lasix.    Length of Stay: 2   CLEGG,AMY 05/09/2014, 10:10 AM  Advanced Heart Failure Team Pager [phone] (M-F; [place])  Please contact [facility] for night-coverage after hours (4p -7a ) and weekends on [email or web address]  Patient seen with NP, agree with the above note.  She is awake/alert on vent.  CVP 11-12 with pulmonary edema on CXR.  Creatinine is stable, K remains elevated.  She has been off Lasix. Would restart Lasix today, can use 40 mg IV bid as has been ordered.  Hopefully extubate soon with diuresis.  SBP 90s-110s today.  Will titrate off dopamine with goal SBP > 85.  Continue amiodarone gtt for now with heparin  gtt, will need to decide on long-term anticoagulation.  Co-ox in am.   Antibiotics for ?PNA per CCM.  Suspect respiratory failure primarily related to pulmonary edema.   Loralie Champagne 05/09/2014 11:23 AM

## 2014-05-09 NOTE — Progress Notes (Signed)
PULMONARY / CRITICAL CARE MEDICINE   Name: Jacqueline Reilly MRN: [id number] DOB: 11-08-1923    ADMISSION DATE:  05/07/2014 CONSULTATION DATE:  05/07/14  REFERRING MD :  Cardiology PRIMARY SERVICE: Cardiology  CHIEF COMPLAINT:  Respiratory failure  BRIEF PATIENT DESCRIPTION: 80 yo with CAD and CHF ( EF 25% ) admitted 7/11 with syncope, AF/RVR, pulmonary edema and acute respiratory failure.  Failed NIMV.  Intubated.  EVENTS / STUDIES: 7/12  Venous Doppler >>> NO DVT 7/12  TTE >>> EF 20%, no RWMA, PAP 38 torr  LINES / TUBES: OETT 7/11 >>> OGT 7/11 >>> R IJ TLC 7/11 >>> Foley 7/11 >>>  CULTURES: 7/12  Blood >>> 7/12  Respiratory >>> 7/11  MRSA PCR  >>> neg 7/12  RVP >>>  ANTIBIOTICS: Ceftraixone 7/12 >>> Azithromycin 7/12 >>>  INTERVAL HISTORY:  VT this morning.  VITAL SIGNS: Temp:  [97.4 F (36.3 C)-99.2 F (37.3 C)] 97.8 F (36.6 C) (07/13 0802) Pulse Rate:  [66-107] 74 (07/13 0759) Resp:  [18-30] 30 (07/13 0759) BP: (84-119)/(53-88) 91/62 mmHg (07/13 0759) SpO2:  [96 %-100 %] 98 % (07/13 0759) FiO2 (%):  [40 %] 40 % (07/13 0759) Weight:  [43.7 kg (96 lb 5.5 oz)] 43.7 kg (96 lb 5.5 oz) (07/13 0400)  HEMODYNAMICS: CVP:  [2 mmHg-12 mmHg] 11 mmHg  VENTILATOR SETTINGS: Vent Mode:  [-] PRVC FiO2 (%):  [40 %] 40 % Set Rate:  [18 bmp] 18 bmp Vt Set:  [360 mL] 360 mL PEEP:  [5 cmH20] 5 cmH20 Pressure Support:  [10 cmH20] 10 cmH20 Plateau Pressure:  [14 cmH20-19 cmH20] 18 cmH20  INTAKE / OUTPUT: Intake/Output     07/12 0701 - 07/13 0700 07/13 0701 - 07/14 0700   I.V. (mL/kg) 1198.2 (27.4)    NG/GT 950    IV Piggyback 400    Total Intake(mL/kg) 2548.2 (58.3)    Urine (mL/kg/hr) 845 (0.8)    Total Output 845     Net +1703.2          Stool Occurrence 2 x     PHYSICAL EXAMINATION: General:  Appears acutely ill, mechanically ventilated, synchronous Neuro:  Encephalopathic, nonfocal, cough / gag diminished HEENT:  PERRL, OETT / OGT Cardiovascular:  Irregular,  no m/r/g Lungs:  Bilateral diminished air entry, rales Abdomen:  Soft, nontender, bowel sounds diminished Musculoskeletal:  Moves all extremities, no edema Skin:  Intact  LABS: CBC  Recent Labs Lab 05/07/14 2058 05/08/14 0155 05/09/14 0430  WBC 13.3* 16.1* 12.2*  HGB 16.8* 15.4* 11.9*  HCT 53.4* 49.4* 37.6  PLT 337 291 219   Coag's  Recent Labs Lab 05/07/14 1749  APTT 42*  INR 1.63*   BMET  Recent Labs Lab 05/08/14 0155 05/08/14 0839 05/09/14 0430  NA 142 144 144  K 5.8* 4.7 3.1*  CL 102 103 103  CO2 18* 24 29  BUN 41* 43* 47*  CREATININE 1.16* 1.10 0.89  GLUCOSE 164* 149* 127*   Electrolytes  Recent Labs Lab 05/08/14 0155 05/08/14 0839 05/09/14 0430  CALCIUM 8.7 9.9 8.5  MG  --  2.5 2.3  PHOS  --  5.0* 3.4   Sepsis Markers  Recent Labs Lab 05/08/14 0135 05/08/14 0145 05/09/14 0430  LATICACIDVEN 5.7*  --   --   PROCALCITON  --  0.42 0.41   ABG  Recent Labs Lab 05/08/14 0100 05/08/14 0507  PHART 7.235* 7.304*  PCO2ART 49.5* 35.9  PO2ART 312.0* 122.0*   Liver Enzymes  Recent Labs Lab 05/08/14  0155  AST 61*  ALT 32  ALKPHOS 96  BILITOT 0.3  ALBUMIN 3.2*   Cardiac Enzymes  Recent Labs Lab 05/07/14 0920 05/07/14 1625 05/07/14 2058 05/08/14 0155 05/09/14 0430  TROPONINI  --  1.59* 1.54* 2.18*  --   PROBNP 15676.0*  --   --   --  23926.0*   Glucose  Recent Labs Lab 05/08/14 1247 05/08/14 1624 05/08/14 2004 05/09/14 0026 05/09/14 0405 05/09/14 0759  GLUCAP 112* 153* 106* 109* 135* 90   IMAGING:  Dg Chest Port 1 View  05/09/2014   CLINICAL DATA:  Respiratory failure.  EXAM: PORTABLE CHEST - 1 VIEW  COMPARISON:  05/08/2014  FINDINGS: Endotracheal tube tip is approximately 2.5 cm above the carina. There has been interval placement of a gastric decompression tube with the tip extending into the stomach. Lungs showed interval worsening of pulmonary edema. There is stable cardiomegaly. No significant pleural fluid  identified. No pneumothorax.  IMPRESSION: Worsening of pulmonary edema.   Electronically Signed   By: Aletta Edouard M.D.   On: 05/09/2014 07:39   Dg Chest Port 1 View  05/08/2014   CLINICAL DATA:  Endotracheal tube and central line placement.  EXAM: PORTABLE CHEST - 1 VIEW  COMPARISON:  05/07/2014  FINDINGS: Endotracheal tube placed with tip measuring 3.3 cm above the carina. Right central venous catheter placed with tip over the cavoatrial junction. No pneumothorax. Cardiac enlargement with pulmonary vascular congestion and diffuse perihilar opacities consistent with edema. Edema pattern is progressing since previous study. No blunting of costophrenic angles. No pneumothorax. Degenerative changes in the shoulders.  IMPRESSION: Appliances appear to be in satisfactory location. Cardiac enlargement with pulmonary vascular congestion and progressive edema since previous study.   Electronically Signed   By: Lucienne Capers M.D.   On: 05/08/2014 01:34   Dg Chest Port 1 View  05/07/2014   CLINICAL DATA:  Syncopal episode  EXAM: PORTABLE CHEST - 1 VIEW  COMPARISON:  04/23/2014  FINDINGS: Cardiac shadow remains enlarged. Diffuse fibrotic changes are again identified. No sizable effusion or focal infiltrate is noted. Chronic changes about shoulder joints are seen.  IMPRESSION: Chronic changes without acute abnormality.   Electronically Signed   By: Inez Catalina M.D.   On: 05/07/2014 10:04   Dg Abd Portable 1v  05/08/2014   CLINICAL DATA:  OG tube placement  EXAM: PORTABLE ABDOMEN - 1 VIEW  COMPARISON:  None.  FINDINGS: Enteric tube tip is localized to the left lower abdomen consistent with location in the distal stomach. Visualized bowel gas pattern is unremarkable. Cardiac enlargement with probable interstitial infiltration in the lungs.  IMPRESSION: Enteric tube tip is in the left abdomen consistent with location in the distal stomach.   Electronically Signed   By: Lucienne Capers M.D.   On: 05/08/2014 05:38    ASSESSMENT / PLAN:  PULMONARY A:  Acute respiratory failure Pulmonary edema Possible pneumonia P:   Goal pH>7.30, SpO2>92 Continuous mechanical support VAP bundle Daily SBT Trend ABG/CXR  CARDIOVASCULAR A:  Acute on chronic systolic heart failure Cardiogenic shock AF/ RVR, now in SR with PVCs VT P:  Goal MAP > 65 Cardiology following ASA BB / ACEI contraindicated - shock D/c Dopamine as arrhythmogenic Start Levophed Amiodarone gtt  RENAL A:   Clinically hypovolemic, but CVP 3 --> 11 and worsened pulmonary edema / pro BNP Metabolic alkalosis may be limiting diuretics use Hypokalemia P:   Trend BMP q12h Lasix 40 mg IV q12h x 2 K 10 x 4  K  40 x 1  GASTROINTESTINAL A:   Nutrition GI Px P:   TF per Nutritionist Protonix  HEMATOLOGIC A: Mild anemia No overt hemorrhage Heparinization for ACS P:  Trend CBC Heparin gtt  INFECTIOUS A: Possible CAP P:   Abx / cx as above  ENDOCRINE A:   Adrenal insufficiency ruled out ( cortisol 113 ) P:   Cortisol SSI  NEUROLOGIC A:   Acute encephalopathy P:   Fentanyl PRN  I have personally obtained history, examined patient, evaluated and interpreted laboratory and imaging results, reviewed medical records, formulated assessment / plan and placed orders.  CRITICAL CARE:  The patient is critically ill with multiple organ systems failure and requires high complexity decision making for assessment and support, frequent evaluation and titration of therapies, application of advanced monitoring technologies and extensive interpretation of multiple databases. Critical Care Time devoted to patient care services described in this note is 40 minutes.   Doree Fudge, MD Pulmonary and [place] Pager: [phone]  05/09/2014, 9:12 AM

## 2014-05-09 NOTE — Progress Notes (Signed)
[place] Progress Note Patient Name: Jacqueline Reilly DOB: Jul 27, 1924 MRN: [id number]  Date of Service  05/09/2014   HPI/Events of Note   Hypotensive and bradycardia with fentanyl  eICU Interventions  Resumed levophed    Intervention Category Major Interventions: Hypotension - evaluation and management;Arrhythmia - evaluation and management  Asencion Noble 05/09/2014, 10:53 PM

## 2014-05-09 NOTE — Progress Notes (Signed)
K+= 3.1 and creat= w/ urine o/p > 30cc/hr; CCM ICU KCL protocol initiated with 10 mEq KCL in 50cc IV x 4, each over one hour.

## 2014-05-09 NOTE — Progress Notes (Signed)
Fentanyl 56mcg given IV at 2200. Pt SBP now 60s and HR-40s.  Pt awake, alert, and following commands.  Dr. Joya Gaskins notified and levophed gtt ordered.  Will monitor pt.

## 2014-05-09 NOTE — Progress Notes (Signed)
INITIAL NUTRITION ASSESSMENT  DOCUMENTATION CODES Per approved criteria  -Not Applicable   INTERVENTION: Change TF to Vital AF 1.2 @ 35 ml/hr via OGT to provide 1008 kcal (97% of needs), 63 grams of protein, and 681 ml of H2O.  Total fluid provided (TF, water flushes, and IV fluids) daily with new TF regimen will be 1100 ml.     NUTRITION DIAGNOSIS: Inadequate oral intake related to inability to eat as evidenced by NPO/Vent status.   Goal: Pt to meet >/= 90% of their estimated nutrition needs   Monitor:  Vent status, TF initiation/tolerance, weight trends, labs  Reason for Assessment: Consult/Vent  80 y.o. female  Admitting Dx: Atrial fibrillation with rapid ventricular response  ASSESSMENT: 80 yo with CAD and CHF ( EF 25% ) admitted 7/11 with syncope, AF/RVR, pulmonary edema and acute respiratory failure. Failed NIMV. Intubated. Pt is currently receiving Vital High Protein at 40 ml/hr which provides 960 kcal (93% estimated needs) and 84 grams of protein (135% of estimated needs). Free water flushes 30 ml q 4 hours provides additional 180 ml water daily and IV fluids at 10 ml/hr provide 240 ml per 24 hours. Current TF regimen meets 93% of estimated energy needs and 135% of estimated protein needs. Per nursing notes, recent residuals have been 20 ml. Per weight history below, pt's weight has been fairly stable since February.  Labs reviewed: low potassium, elevated BUN  Height: Ht Readings from Last 1 Encounters:  05/07/14 5' (1.524 m)    Weight: Wt Readings from Last 1 Encounters:  05/09/14 96 lb 5.5 oz (43.7 kg)    Ideal Body Weight: 100 lbs  % Ideal Body Weight: 95%  Wt Readings from Last 10 Encounters:  05/09/14 96 lb 5.5 oz (43.7 kg)  05/05/14 92 lb 8 oz (41.958 kg)  04/28/14 98 lb 12 oz (44.793 kg)  04/25/14 95 lb 10.9 oz (43.4 kg)  03/29/14 100 lb (45.36 kg)  02/09/14 98 lb (44.453 kg)  01/27/14 97 lb (43.999 kg)  11/29/13 97 lb (43.999 kg)  10/25/13 98 lb  (44.453 kg)  10/05/13 105 lb (47.628 kg)    Usual Body Weight: unknown  % Usual Body Weight: NA  BMI:  Body mass index is 18.82 kg/(m^2).  Patient is currently intubated on ventilator support MV: 8.2 L/min Temp (24hrs), Avg:98.3 F (36.8 C), Min:97.4 F (36.3 C), Max:99.2 F (37.3 C)  Propofol: none   Estimated Nutritional Needs: Kcal: 1034 Protein: 52-62 grams Fluid: 1.3 L/day  Skin: intact  Diet Order: NPO  EDUCATION NEEDS: -No education needs identified at this time   Intake/Output Summary (Last 24 hours) at 05/09/14 0958 Last data filed at 05/09/14 0900  Gross per 24 hour  Intake 2767.6 ml  Output    955 ml  Net 1812.6 ml    Last BM: PTA   Labs:   Recent Labs Lab 05/08/14 0155 05/08/14 0839 05/09/14 0430  NA 142 144 144  K 5.8* 4.7 3.1*  CL 102 103 103  CO2 18* 24 29  BUN 41* 43* 47*  CREATININE 1.16* 1.10 0.89  CALCIUM 8.7 9.9 8.5  MG  --  2.5 2.3  PHOS  --  5.0* 3.4  GLUCOSE 164* 149* 127*    CBG (last 3)   Recent Labs  05/09/14 0026 05/09/14 0405 05/09/14 0759  GLUCAP 109* 135* 90    Scheduled Meds: . antiseptic oral rinse  15 mL Mouth Rinse QID  . aspirin  81 mg Oral Daily  .  azithromycin  500 mg Intravenous QHS  . cefTRIAXone (ROCEPHIN)  IV  1 g Intravenous QHS  . chlorhexidine  15 mL Mouth Rinse BID  . feeding supplement (VITAL HIGH PROTEIN)  1,000 mL Per Tube Q24H  . furosemide  40 mg Intravenous Q12H  . insulin aspart  0-9 Units Subcutaneous 6 times per day  . multivitamin with minerals  1 tablet Oral Daily  . pantoprazole (PROTONIX) IV  40 mg Intravenous QHS  . potassium chloride  10 mEq Intravenous Q1 Hr x 4  . potassium chloride  40 mEq Per Tube Once  . sodium chloride  10-40 mL Intracatheter Q12H  . sodium chloride  3 mL Intravenous Q12H    Continuous Infusions: . sodium chloride 10 mL/hr at 05/09/14 0900  . amiodarone 30 mg/hr (05/09/14 0900)  . heparin 650 Units/hr (05/09/14 0900)  . norepinephrine  (LEVOPHED) Adult infusion      Past Medical History  Diagnosis Date  . PVD (peripheral vascular disease)   . Ventricular dysfunction     left; ischemic  . Hyperlipidemia   . AAA (abdominal aortic aneurysm)   . Renal artery stenosis   . Hypertension   . Coronary artery disease 2007    moderate ASCAD of the left system s/p PCI of the D2 and PCI of the left circ 03/2009  . CHF (congestive heart failure)     Past Surgical History  Procedure Laterality Date  . Retinal cryopexy      right eye (for retinal detachment)  . Angioplasty    . Coronary stent placement      Pryor Ochoa RD, LDN Inpatient Clinical Dietitian Pager: [phone] After Hours Pager: [phone]

## 2014-05-10 ENCOUNTER — Inpatient Hospital Stay (HOSPITAL_COMMUNITY): Payer: Medicare Other

## 2014-05-10 LAB — BASIC METABOLIC PANEL
Anion gap: 13 (ref 5–15)
Anion gap: 17 — ABNORMAL HIGH (ref 5–15)
Anion gap: 18 — ABNORMAL HIGH (ref 5–15)
BUN: 52 mg/dL — ABNORMAL HIGH (ref 6–23)
BUN: 53 mg/dL — ABNORMAL HIGH (ref 6–23)
BUN: 57 mg/dL — ABNORMAL HIGH (ref 6–23)
CO2: 23 mEq/L (ref 19–32)
CO2: 25 mEq/L (ref 19–32)
CO2: 29 mEq/L (ref 19–32)
Calcium: 8.1 mg/dL — ABNORMAL LOW (ref 8.4–10.5)
Calcium: 8.4 mg/dL (ref 8.4–10.5)
Calcium: 8.4 mg/dL (ref 8.4–10.5)
Chloride: 100 mEq/L (ref 96–112)
Chloride: 103 mEq/L (ref 96–112)
Chloride: 97 mEq/L (ref 96–112)
Creatinine, Ser: 0.8 mg/dL (ref 0.50–1.10)
Creatinine, Ser: 0.9 mg/dL (ref 0.50–1.10)
Creatinine, Ser: 0.91 mg/dL (ref 0.50–1.10)
GFR calc Af Amer: 63 mL/min — ABNORMAL LOW (ref >90)
GFR calc Af Amer: 64 mL/min — ABNORMAL LOW (ref >90)
GFR calc Af Amer: 74 mL/min — ABNORMAL LOW (ref >90)
GFR calc non Af Amer: 55 mL/min — ABNORMAL LOW (ref >90)
GFR calc non Af Amer: 55 mL/min — ABNORMAL LOW (ref >90)
GFR calc non Af Amer: 64 mL/min — ABNORMAL LOW (ref >90)
Glucose, Bld: 137 mg/dL — ABNORMAL HIGH (ref 70–99)
Glucose, Bld: 149 mg/dL — ABNORMAL HIGH (ref 70–99)
Glucose, Bld: 311 mg/dL — ABNORMAL HIGH (ref 70–99)
Potassium: 3.6 mEq/L — ABNORMAL LOW (ref 3.7–5.3)
Potassium: 4.4 mEq/L (ref 3.7–5.3)
Potassium: 5.4 mEq/L — ABNORMAL HIGH (ref 3.7–5.3)
Sodium: 138 mEq/L (ref 137–147)
Sodium: 142 mEq/L (ref 137–147)
Sodium: 145 mEq/L (ref 137–147)

## 2014-05-10 LAB — PHOSPHORUS: Phosphorus: 3.8 mg/dL (ref 2.3–4.6)

## 2014-05-10 LAB — CULTURE, RESPIRATORY

## 2014-05-10 LAB — CARBOXYHEMOGLOBIN
Carboxyhemoglobin: 0.8 % (ref 0.5–1.5)
Carboxyhemoglobin: 1.1 % (ref 0.5–1.5)
Methemoglobin: 0.8 % (ref 0.0–1.5)
Methemoglobin: 0.8 % (ref 0.0–1.5)
O2 Saturation: 43.9 %
O2 Saturation: 53.6 %
Total hemoglobin: 12.5 g/dL (ref 12.0–16.0)
Total hemoglobin: 12.6 g/dL (ref 12.0–16.0)

## 2014-05-10 LAB — GLUCOSE, CAPILLARY
Glucose-Capillary: 112 mg/dL — ABNORMAL HIGH (ref 70–99)
Glucose-Capillary: 117 mg/dL — ABNORMAL HIGH (ref 70–99)
Glucose-Capillary: 126 mg/dL — ABNORMAL HIGH (ref 70–99)
Glucose-Capillary: 137 mg/dL — ABNORMAL HIGH (ref 70–99)
Glucose-Capillary: 147 mg/dL — ABNORMAL HIGH (ref 70–99)
Glucose-Capillary: 164 mg/dL — ABNORMAL HIGH (ref 70–99)
Glucose-Capillary: 283 mg/dL — ABNORMAL HIGH (ref 70–99)

## 2014-05-10 LAB — MAGNESIUM: Magnesium: 2.5 mg/dL (ref 1.5–2.5)

## 2014-05-10 LAB — CBC
HCT: 40.3 % (ref 36.0–46.0)
Hemoglobin: 12.9 g/dL (ref 12.0–15.0)
MCH: 27.3 pg (ref 26.0–34.0)
MCHC: 32 g/dL (ref 30.0–36.0)
MCV: 85.2 fL (ref 78.0–100.0)
Platelets: 317 10*3/uL (ref 150–400)
RBC: 4.73 MIL/uL (ref 3.87–5.11)
RDW: 15.2 % (ref 11.5–15.5)
WBC: 17.3 10*3/uL — ABNORMAL HIGH (ref 4.0–10.5)

## 2014-05-10 LAB — CULTURE, RESPIRATORY W GRAM STAIN

## 2014-05-10 LAB — PROCALCITONIN: Procalcitonin: 0.33 ng/mL

## 2014-05-10 LAB — HEPARIN LEVEL (UNFRACTIONATED): Heparin Unfractionated: 0.26 IU/mL — ABNORMAL LOW (ref 0.30–0.70)

## 2014-05-10 MED ORDER — FUROSEMIDE 10 MG/ML IJ SOLN
40.0000 mg | Freq: Three times a day (TID) | INTRAMUSCULAR | Status: DC
  Administered 2014-05-10 (×3): 40 mg via INTRAVENOUS
  Filled 2014-05-10 (×5): qty 4

## 2014-05-10 MED ORDER — MILRINONE IN DEXTROSE 20 MG/100ML IV SOLN
0.2500 ug/kg/min | INTRAVENOUS | Status: DC
  Administered 2014-05-10 – 2014-05-15 (×5): 0.25 ug/kg/min via INTRAVENOUS
  Filled 2014-05-10 (×5): qty 100

## 2014-05-10 NOTE — Progress Notes (Signed)
Advanced Heart Failure Rounding Note   Subjective:   Ms. Jacqueline Jacqueline) is an 80 y/o woman with CAD, AAA, systolic HF EF 123XX123, dementia, and CAD and has had two previous coronary interventions including a cutting balloon to her D2 in 2007 and a DES to her mid LCX in 2010. Her LV dysfunction has been out of proportion to her CAD.    Admitted 05/07/14 with syncope and dyspnea. She had new onset A-fib RVR. Troponin was 1.03>  1.59> 2.18  .   She was placed on amiodarone and heparin drip. She developed respiratory distress and required intubation. She converted to NSR but later was bradycardiac with NSVT. Amiodarone temporarily stopped but restarted after hyperkalemia resolved. Hyperkalemia was thought to be contributing to bradycardia.  Dopamine later added for hypotension. Diuresed with IV lasix.  Yesterday dopamine was weaned off. Weight continues to trend up. Co-ox is down considerably since she was weaned off dopamine.  Creatinine stable.  Pulmonary edema on CXR with CVP 14 this morning.   Last night bradycardic.30-40s. Remains intubated but follows commands.   Lower extremity doppler- negative for DVT  CO-OX 69%> 29>43%    Objective:   Weight Range:  Vital Signs:   Temp:  [96.9 F (36.1 C)-97.6 F (36.4 C)] 97.6 F (36.4 C) (07/14 0400) Pulse Rate:  [30-86] 65 (07/14 0715) Resp:  [20-35] 25 (07/14 0715) BP: (48-127)/(31-86) 98/65 mmHg (07/14 0715) SpO2:  [96 %-100 %] 96 % (07/14 0715) FiO2 (%):  [40 %] 40 % (07/14 0400) Weight:  [98 lb 12.3 oz (44.8 kg)] 98 lb 12.3 oz (44.8 kg) (07/14 0600) Last BM Date: 05/09/14  Weight change: Filed Weights   05/08/14 0500 05/09/14 0400 05/10/14 0600  Weight: 94 lb 9.6 oz (42.91 kg) 96 lb 5.5 oz (43.7 kg) 98 lb 12.3 oz (44.8 kg)    Intake/Output:   Intake/Output Summary (Last 24 hours) at 05/10/14 0805 Last data filed at 05/10/14 0700  Gross per 24 hour  Intake 2028.87 ml  Output   1315 ml  Net 713.87 ml     Physical Exam:CVP 13   General:  Intubated. Awake  HEENT: normal Neck: supple. JVP to jaw  Carotids 2+ bilat; no bruits. No lymphadenopathy or thryomegaly appreciated. Cor: PMI nondisplaced. Regular rate & rhythm. No rubs, gallops or murmurs. Lungs: Rhonchi throughout Abdomen: soft, nontender, nondistended. No hepatosplenomegaly. No bruits or masses. Good bowel sounds. Extremities: Cool.  No cyanosis, clubbing, rash, edema Neuro: Intubated but following commands on vent.  moves all 4 extremities w/o difficulty.   Telemetry: SR 80s PVCs   Labs: Basic Metabolic Panel:  Recent Labs Lab 05/08/14 0155 05/08/14 0839 05/09/14 0430 05/09/14 1616 05/09/14 2340 05/10/14 0545  NA 142 144 144 144 138 142  K 5.8* 4.7 3.1* 5.4* 5.4* 4.4  CL 102 103 103 105 97 100  CO2 18* 24 29 27 23 25   GLUCOSE 164* 149* 127* 175* 311* 149*  BUN 41* 43* 47* 48* 52* 57*  CREATININE 1.16* 1.10 0.89 0.80 0.90 0.91  CALCIUM 8.7 9.9 8.5 8.7 8.4 8.4  MG  --  2.5 2.3  --   --  2.5  PHOS  --  5.0* 3.4  --   --  3.8    Liver Function Tests:  Recent Labs Lab 05/08/14 0155  AST 61*  ALT 32  ALKPHOS 96  BILITOT 0.3  PROT 6.5  ALBUMIN 3.2*   No results found for this basename: LIPASE, AMYLASE,  in the last 168  hours No results found for this basename: AMMONIA,  in the last 168 hours  CBC:  Recent Labs Lab 05/07/14 0920 05/07/14 2054 05/07/14 2058 05/08/14 0155 05/09/14 0430 05/10/14 0545  WBC 11.3*  --  13.3* 16.1* 12.2* 17.3*  NEUTROABS  --   --   --   --  9.8*  --   HGB 14.9 18.7* 16.8* 15.4* 11.9* 12.9  HCT 46.5* 55.0* 53.4* 49.4* 37.6 40.3  MCV 86.4  --  87.4 87.6 85.8 85.2  PLT 351  --  337 291 219 317    Cardiac Enzymes:  Recent Labs Lab 05/07/14 1625 05/07/14 2058 05/08/14 0155  TROPONINI 1.59* 1.54* 2.18*    BNP: BNP (last 3 results)  Recent Labs  04/28/14 1107 05/07/14 0920 05/09/14 0430  PROBNP 15377.0* 15676.0* 23926.0*     Other results:  :   Imaging: Dg Chest Port 1  View  05/09/2014   CLINICAL DATA:  Respiratory failure.  EXAM: PORTABLE CHEST - 1 VIEW  COMPARISON:  05/08/2014  FINDINGS: Endotracheal tube tip is approximately 2.5 cm above the carina. There has been interval placement of a gastric decompression tube with the tip extending into the stomach. Lungs showed interval worsening of pulmonary edema. There is stable cardiomegaly. No significant pleural fluid identified. No pneumothorax.  IMPRESSION: Worsening of pulmonary edema.   Electronically Signed   By: Aletta Edouard M.D.   On: 05/09/2014 07:39     Medications:     Scheduled Medications: . antiseptic oral rinse  15 mL Mouth Rinse QID  . aspirin  81 mg Oral Daily  . azithromycin  500 mg Intravenous QHS  . calcium gluconate  1 g Intravenous Once  . cefTRIAXone (ROCEPHIN)  IV  1 g Intravenous QHS  . chlorhexidine  15 mL Mouth Rinse BID  . furosemide  40 mg Intravenous TID  . insulin aspart  0-9 Units Subcutaneous 6 times per day  . multivitamin with minerals  1 tablet Oral Daily  . pantoprazole (PROTONIX) IV  40 mg Intravenous QHS  . sodium chloride  10-40 mL Intracatheter Q12H  . sodium chloride  3 mL Intravenous Q12H    Infusions: . sodium chloride 10 mL/hr at 05/09/14 2000  . amiodarone 30 mg/hr (05/10/14 0627)  . feeding supplement (VITAL AF 1.2 CAL) 1,000 mL (05/09/14 2000)  . heparin 700 Units/hr (05/10/14 0700)  . milrinone    . norepinephrine (LEVOPHED) Adult infusion Stopped (05/10/14 0600)    PRN Medications: sodium chloride, fentaNYL, fentaNYL, ondansetron (ZOFRAN) IV, sodium chloride, sodium chloride   Assessment:  1. A fib RVR 2. Syncope 3. NSTEMI 4. Acute Respiratory Failure- Intubated  5. Acute/Chronic Systolic Heart Failure: EF 25-30%, LV dysfunction has been out of proportion to CAD.  6. Hyperkalemia  7. Early Dementia 8. Hypokalemia 9. CAD 10. ?PNA: on antibiotics.  11. Limited Code- No cpr . No defibrillation   Plan/Discussion:    CCM following.  Recommendations appreciated.   Maintaining NSR. Continue amiodarone drip and Heparin. Lower extremity doppler negative.   Volume status elevated. Increase lasix. CO-OX low will add milrinone at 0.25 mcg. May need levophed  K in normal range today on IV Lasix.    Length of Stay: 3   CLEGG,AMY 05/10/2014, 8:05 AM  Advanced Heart Failure Team Pager [phone] (M-F; 7a - 4p)  Please contact [facility] for night-coverage after hours (4p -7a ) and weekends on [email or web address]  Patient seen with NP, agree with the above note.  She was weaned  off dopamine yesterday.  Overnight, had some bradycardia.  BP stable this morning but co-ox considerably lower off dopamine.  She remains intubated with pulmonary edema, CVP 14 this morning.  She did not have significant diuresis yesterday.  Creatinine remains stable.   I think that she is going to need positive inotropy in order to clear her volume excess and extubate.  I will start her on milrinone 0.25, will need to follow BP closely (may need additional low dose norepinephrine).  Will increase Lasix to 40 mg IV every 8 hrs.  Repeat co-ox this pm on milrinone.  While she is on milrinone, will keep amiodarone IV.   Loralie Champagne 05/10/2014 8:12 AM

## 2014-05-10 NOTE — Progress Notes (Signed)
PULMONARY / CRITICAL CARE MEDICINE  Name: Jacqueline Reilly MRN: [id number] DOB: 07-16-1924    ADMISSION DATE:  05/07/2014 CONSULTATION DATE:  05/07/14  REFERRING MD :  Cardiology PRIMARY SERVICE: Cardiology  CHIEF COMPLAINT:  Respiratory failure  BRIEF PATIENT DESCRIPTION: 80 yo with CAD and CHF ( EF 25% ) admitted 7/11 with syncope, AF/RVR, pulmonary edema and acute respiratory failure.  Failed NIMV.  Intubated.  EVENTS / STUDIES: 7/12  Venous Doppler >>> NO DVT 7/12  TTE >>> EF 20%, no RWMA, PAP 38 torr  LINES / TUBES: OETT 7/11 >>> OGT 7/11 >>> R IJ TLC 7/11 >>> Foley 7/11 >>>  CULTURES: 7/12  Blood >>> 7/12  Respiratory >>> 7/11  MRSA PCR  >>> neg 7/12  RVP >>>  ANTIBIOTICS: Ceftraixone 7/12 >>> Azithromycin 7/12 >>>  INTERVAL HISTORY:  COOX low >>> Levophed and Milrinone started, positive fluid balance >>> Lasix increased, failed SBT  VITAL SIGNS: Temp:  [96.2 F (35.7 C)-97.6 F (36.4 C)] 96.2 F (35.7 C) (07/14 0807) Pulse Rate:  [30-86] 71 (07/14 0845) Resp:  [18-35] 18 (07/14 0845) BP: (48-127)/(31-86) 101/70 mmHg (07/14 0830) SpO2:  [95 %-100 %] 96 % (07/14 0845) FiO2 (%):  [40 %] 40 % (07/14 0800) Weight:  [44.8 kg (98 lb 12.3 oz)] 44.8 kg (98 lb 12.3 oz) (07/14 0600)  HEMODYNAMICS: CVP:  [10 mmHg-14 mmHg] 14 mmHg  VENTILATOR SETTINGS: Vent Mode:  [-] PRVC FiO2 (%):  [40 %] 40 % Set Rate:  [18 bmp] 18 bmp Vt Set:  [360 mL] 360 mL PEEP:  [5 cmH20] 5 cmH20 Plateau Pressure:  [14 cmH20-26 cmH20] 18 cmH20  INTAKE / OUTPUT: Intake/Output     07/13 0701 - 07/14 0700 07/14 0701 - 07/15 0700   I.V. (mL/kg) 984.6 (22) 33.7 (0.8)   NG/GT 920.3 35   IV Piggyback 350    Total Intake(mL/kg) 2255 (50.3) 68.7 (1.5)   Urine (mL/kg/hr) 1415 (1.3) 125 (1.6)   Total Output 1415 125   Net +840 -56.3         PHYSICAL EXAMINATION: General:  No distress; tachypnea / increased work of breathing while on SBT 5/5 Neuro:  Awake, alert, following commands HEENT:   PERRL, OETT / OGT Cardiovascular:  Regular, no m/r/g Lungs:  Bilateral diminished air entry, rales Abdomen:  Soft, nontender, bowel sounds diminished Musculoskeletal:  Moves all extremities, no edema Skin:  Intact  LABS: CBC  Recent Labs Lab 05/08/14 0155 05/09/14 0430 05/10/14 0545  WBC 16.1* 12.2* 17.3*  HGB 15.4* 11.9* 12.9  HCT 49.4* 37.6 40.3  PLT 291 219 317   Coag's  Recent Labs Lab 05/07/14 1749  APTT 42*  INR 1.63*   BMET  Recent Labs Lab 05/09/14 1616 05/09/14 2340 05/10/14 0545  NA 144 138 142  K 5.4* 5.4* 4.4  CL 105 97 100  CO2 27 23 25   BUN 48* 52* 57*  CREATININE 0.80 0.90 0.91  GLUCOSE 175* 311* 149*   Electrolytes  Recent Labs Lab 05/08/14 0839 05/09/14 0430 05/09/14 1616 05/09/14 2340 05/10/14 0545  CALCIUM 9.9 8.5 8.7 8.4 8.4  MG 2.5 2.3  --   --  2.5  PHOS 5.0* 3.4  --   --  3.8   Sepsis Markers  Recent Labs Lab 05/08/14 0135 05/08/14 0145 05/09/14 0430 05/09/14 0858 05/10/14 0545  LATICACIDVEN 5.7*  --   --  1.0  --   PROCALCITON  --  0.42 0.41  --  0.33   ABG  Recent Labs Lab 05/07/14 2204 05/08/14 0100 05/08/14 0507  PHART 7.269* 7.235* 7.304*  PCO2ART 48.3* 49.5* 35.9  PO2ART 168.0* 312.0* 122.0*   Liver Enzymes  Recent Labs Lab 05/08/14 0155  AST 61*  ALT 32  ALKPHOS 96  BILITOT 0.3  ALBUMIN 3.2*   Cardiac Enzymes  Recent Labs Lab 05/07/14 0920 05/07/14 1625 05/07/14 2058 05/08/14 0155 05/09/14 0430  TROPONINI  --  1.59* 1.54* 2.18*  --   PROBNP 15676.0*  --   --   --  23926.0*   Glucose  Recent Labs Lab 05/09/14 1155 05/09/14 1625 05/09/14 2017 05/09/14 2352 05/10/14 0356 05/10/14 0804  GLUCAP 153* 178* 146* 283* 147* 117*   IMAGING:  Dg Chest Port 1 View  05/10/2014   CLINICAL DATA:  Follow-up pulmonary edema.  EXAM: PORTABLE CHEST - 1 VIEW  COMPARISON:  05/09/2014.  FINDINGS: Endotracheal tube tip 3.6 cm above the carina.  Nasogastric tube courses below the diaphragm. Tip  is not included on the present exam.  Right central line tip distal superior vena cava/ cavoatrial junction level.  Cardiomegaly.  Calcified tortuous aorta.  Pulmonary edema similar to the prior exam.  No segmental consolidation. Evaluation left lung base limited by cardiomegaly.  Shoulder joint degenerative changes. Possible bone infarct proximal left humerus.  IMPRESSION: No significant change in degree of pulmonary edema. Please see above.   Electronically Signed   By: Chauncey Cruel M.D.   On: 05/10/2014 08:17   Dg Chest Port 1 View  05/09/2014   CLINICAL DATA:  Respiratory failure.  EXAM: PORTABLE CHEST - 1 VIEW  COMPARISON:  05/08/2014  FINDINGS: Endotracheal tube tip is approximately 2.5 cm above the carina. There has been interval placement of a gastric decompression tube with the tip extending into the stomach. Lungs showed interval worsening of pulmonary edema. There is stable cardiomegaly. No significant pleural fluid identified. No pneumothorax.  IMPRESSION: Worsening of pulmonary edema.   Electronically Signed   By: Aletta Edouard M.D.   On: 05/09/2014 07:39   ASSESSMENT / PLAN:  PULMONARY A:  Acute respiratory failure Pulmonary edema Possible pneumonia P:   Goal pH>7.30, SpO2>92 Continuous mechanical support VAP bundle Daily SBT, but doubt can extubate until some fluid is off Trend ABG/CXR  CARDIOVASCULAR A:  Acute on chronic systolic heart failure Cardiogenic shock AF/ RVR, now in SR with PACs VT P:  Goal MAP > 65 Cardiology following ASA BB / ACEI contraindicated - shock Levophed / Milrinone gtt Amiodarone gtt Trend COOX  RENAL A:   Clinically hypovolemic, but CVP 3 --> 11 and worsened pulmonary edema / pro BNP Metabolic alkalosis may be limiting diuretics use P:   Trend BMP q12h Lasix 40 mg IV q8h, may escalate further if no improvement in diuresis  GASTROINTESTINAL A:   Nutrition GI Px P:   TF per Nutritionist Protonix  HEMATOLOGIC A: Mild  anemia No overt hemorrhage Heparinization for ACS P:  Trend CBC Heparin gtt  INFECTIOUS A: Possible CAP P:   Abx / cx as above  ENDOCRINE A:   Adrenal insufficiency ruled out ( cortisol 113 ) P:   Cortisol SSI  NEUROLOGIC A:   Acute encephalopathy P:   Fentanyl PRN  I have personally obtained history, examined patient, evaluated and interpreted laboratory and imaging results, reviewed medical records, formulated assessment / plan and placed orders.  CRITICAL CARE:  The patient is critically ill with multiple organ systems failure and requires high complexity decision making for assessment and support,  frequent evaluation and titration of therapies, application of advanced monitoring technologies and extensive interpretation of multiple databases. Critical Care Time devoted to patient care services described in this note is 35 minutes.   Doree Fudge, MD Pulmonary and [place] Pager: [phone]  05/10/2014, 8:48 AM

## 2014-05-10 NOTE — Progress Notes (Signed)
[place] for Heparin Indication: Afib   Allergies  Allergen Reactions  . Codeine Nausea Only  . Garlic Nausea Only    Labs:  Recent Labs  05/07/14 1625 05/07/14 1749  05/07/14 2058 05/08/14 0155  05/08/14 1536 05/09/14 0430 05/09/14 1616 05/09/14 2340 05/10/14 0545  HGB  --   --   < > 16.8* 15.4*  --   --  11.9*  --   --  12.9  HCT  --   --   < > 53.4* 49.4*  --   --  37.6  --   --  40.3  PLT  --   --   --  337 291  --   --  219  --   --  317  APTT  --  42*  --   --   --   --   --   --   --   --   --   LABPROT  --  19.3*  --   --   --   --   --   --   --   --   --   INR  --  1.63*  --   --   --   --   --   --   --   --   --   HEPARINUNFRC  --   --   --  <0.10*  --   < > 0.41 0.30  --   --  0.26*  CREATININE  --   --   < >  --  1.16*  < >  --  0.89 0.80 0.90 0.91  TROPONINI 1.59*  --   --  1.54* 2.18*  --   --   --   --   --   --   < > = values in this interval not displayed. Estimated Creatinine Clearance: 30.2 ml/min (by C-G formula based on Cr of 0.91).  Assessment: 80 y.o. female with Afib/ACS for heparin  Goal of Therapy:  Heparin level 0.3-0.7 units/ml Monitor platelets by anticoagulation protocol: Yes   Plan:  Increase Heparin 700 units/hr  Phillis Knack, PharmD, BCPS   05/10/2014 6:57 AM

## 2014-05-10 NOTE — Progress Notes (Signed)
Dr. Jimmy Footman notified of co-ox results.  No orders received.  Will monitor pt.

## 2014-05-11 LAB — BASIC METABOLIC PANEL
Anion gap: 15 (ref 5–15)
Anion gap: 14 (ref 5–15)
Anion gap: 13 (ref 5–15)
BUN: 50 mg/dL — ABNORMAL HIGH (ref 6–23)
BUN: 54 mg/dL — ABNORMAL HIGH (ref 6–23)
BUN: 55 mg/dL — ABNORMAL HIGH (ref 6–23)
CO2: 29 mEq/L (ref 19–32)
CO2: 27 mEq/L (ref 19–32)
CO2: 28 mEq/L (ref 19–32)
Calcium: 8.4 mg/dL (ref 8.4–10.5)
Calcium: 8.6 mg/dL (ref 8.4–10.5)
Calcium: 8.6 mg/dL (ref 8.4–10.5)
Chloride: 99 mEq/L (ref 96–112)
Chloride: 103 mEq/L (ref 96–112)
Chloride: 102 mEq/L (ref 96–112)
Creatinine, Ser: 0.81 mg/dL (ref 0.50–1.10)
Creatinine, Ser: 0.82 mg/dL (ref 0.50–1.10)
Creatinine, Ser: 0.84 mg/dL (ref 0.50–1.10)
GFR calc Af Amer: 73 mL/min — ABNORMAL LOW (ref >90)
GFR calc Af Amer: 72 mL/min — ABNORMAL LOW (ref >90)
GFR calc Af Amer: 70 mL/min — ABNORMAL LOW (ref >90)
GFR calc non Af Amer: 63 mL/min — ABNORMAL LOW (ref >90)
GFR calc non Af Amer: 62 mL/min — ABNORMAL LOW (ref >90)
GFR calc non Af Amer: 60 mL/min — ABNORMAL LOW (ref >90)
Glucose, Bld: 120 mg/dL — ABNORMAL HIGH (ref 70–99)
Glucose, Bld: 137 mg/dL — ABNORMAL HIGH (ref 70–99)
Glucose, Bld: 135 mg/dL — ABNORMAL HIGH (ref 70–99)
Potassium: 3.3 mEq/L — ABNORMAL LOW (ref 3.7–5.3)
Potassium: 6.4 mEq/L — ABNORMAL HIGH (ref 3.7–5.3)
Potassium: 5.5 mEq/L — ABNORMAL HIGH (ref 3.7–5.3)
Sodium: 143 mEq/L (ref 137–147)
Sodium: 144 mEq/L (ref 137–147)
Sodium: 143 mEq/L (ref 137–147)

## 2014-05-11 LAB — GLUCOSE, CAPILLARY
Glucose-Capillary: 104 mg/dL — ABNORMAL HIGH (ref 70–99)
Glucose-Capillary: 163 mg/dL — ABNORMAL HIGH (ref 70–99)
Glucose-Capillary: 196 mg/dL — ABNORMAL HIGH (ref 70–99)
Glucose-Capillary: 142 mg/dL — ABNORMAL HIGH (ref 70–99)
Glucose-Capillary: 135 mg/dL — ABNORMAL HIGH (ref 70–99)
Glucose-Capillary: 188 mg/dL — ABNORMAL HIGH (ref 70–99)

## 2014-05-11 LAB — HEPARIN LEVEL (UNFRACTIONATED)
Heparin Unfractionated: 0.28 IU/mL — ABNORMAL LOW (ref 0.30–0.70)
Heparin Unfractionated: 0.33 IU/mL (ref 0.30–0.70)

## 2014-05-11 LAB — PHOSPHORUS: Phosphorus: 3.1 mg/dL (ref 2.3–4.6)

## 2014-05-11 LAB — CBC
HCT: 37.1 % (ref 36.0–46.0)
Hemoglobin: 11.8 g/dL — ABNORMAL LOW (ref 12.0–15.0)
MCH: 26.7 pg (ref 26.0–34.0)
MCHC: 31.8 g/dL (ref 30.0–36.0)
MCV: 83.9 fL (ref 78.0–100.0)
Platelets: 255 10*3/uL (ref 150–400)
RBC: 4.42 MIL/uL (ref 3.87–5.11)
RDW: 15.1 % (ref 11.5–15.5)
WBC: 11.8 10*3/uL — ABNORMAL HIGH (ref 4.0–10.5)

## 2014-05-11 LAB — CARBOXYHEMOGLOBIN
Carboxyhemoglobin: 1.3 % (ref 0.5–1.5)
Methemoglobin: 0.7 % (ref 0.0–1.5)
O2 Saturation: 63.5 %
Total hemoglobin: 12.3 g/dL (ref 12.0–16.0)

## 2014-05-11 LAB — MAGNESIUM: Magnesium: 2.3 mg/dL (ref 1.5–2.5)

## 2014-05-11 MED ORDER — FUROSEMIDE 10 MG/ML IJ SOLN
80.0000 mg | Freq: Three times a day (TID) | INTRAMUSCULAR | Status: DC
  Administered 2014-05-11 – 2014-05-12 (×6): 80 mg via INTRAVENOUS
  Filled 2014-05-11 (×7): qty 8

## 2014-05-11 MED ORDER — POTASSIUM CHLORIDE 20 MEQ/15ML (10%) PO LIQD
40.0000 meq | Freq: Once | ORAL | Status: AC
  Filled 2014-05-11: qty 30

## 2014-05-11 MED ORDER — POTASSIUM CHLORIDE 20 MEQ/15ML (10%) PO LIQD
40.0000 meq | Freq: Once | ORAL | Status: AC
  Administered 2014-05-11: 40 meq via ORAL
  Filled 2014-05-11: qty 30

## 2014-05-11 MED ORDER — POTASSIUM CHLORIDE 20 MEQ/15ML (10%) PO LIQD
ORAL | Status: AC
  Administered 2014-05-11: 40 meq
  Filled 2014-05-11: qty 30

## 2014-05-11 NOTE — Progress Notes (Addendum)
ANTICOAGULATION CONSULT NOTE - Follow Up Consult  Pharmacy Consult for Heparin Indication: chest pain/ACS  Allergies  Allergen Reactions  . Codeine Nausea Only  . Garlic Nausea Only    Labs:  Recent Labs  05/09/14 0430  05/10/14 0545 05/10/14 1800 05/11/14 0500  HGB 11.9*  --  12.9  --  11.8*  HCT 37.6  --  40.3  --  37.1  PLT 219  --  317  --  255  HEPARINUNFRC 0.30  --  0.26*  --  0.28*  CREATININE 0.89  < > 0.91 0.80 0.81  < > = values in this interval not displayed. Estimated Creatinine Clearance: 34.5 ml/min (by C-G formula based on Cr of 0.81).  Assessment: 80yo female presents with near syncopal episode according to son.  She was found to be in Afib with RVR in the ED. Her troponins were elevated as well as a significant elevation in her BNP.    Back into afib overnight (amio gtt continued), started levo @2  for hypotension.  HL still slightly low at 0.28 - will increase to 800/hr and recheck level at noon. CBC trending down but no bleeding complications noted.  Cultures are no growth to date currently on azith/ceftriaxone  Goal of Therapy:  Heparin level 0.3-0.7 units/ml Monitor platelets by anticoagulation protocol: Yes   Plan:  Increase heparin to 800 units / hr - recheck level this afternoon Daily heparin level, CBC  Erin Hearing PharmD., BCPS Clinical Pharmacist Pager [phone] 05/11/2014 8:06 AM   Confirmatory level (0.33) is now at goal on 800 units/hr. No bleeding issues have been noted.  05/11/2014 12:34 PM

## 2014-05-11 NOTE — Progress Notes (Addendum)
PULMONARY / CRITICAL CARE MEDICINE  Name: Jacqueline Reilly MRN: [id number] DOB: 06/29/1924    ADMISSION DATE:  05/07/2014 CONSULTATION DATE:  05/07/14  REFERRING MD :  Cardiology PRIMARY SERVICE: Cardiology  CHIEF COMPLAINT:  Respiratory failure  BRIEF PATIENT DESCRIPTION: 80 yo with CAD and CHF ( EF 25% ) admitted 7/11 with syncope, AF/RVR, pulmonary edema and acute respiratory failure.  Failed NIMV.  Intubated.  EVENTS / STUDIES: 7/12  Venous Doppler >>> NO DVT 7/12  TTE >>> EF 20%, no RWMA, PAP 38 torr  LINES / TUBES: OETT 7/11 >>> OGT 7/11 >>> R IJ TLC 7/11 >>> Foley 7/11 >>>  CULTURES: 7/12  Blood >>> 7/12  Respiratory >>> npof 7/11  MRSA PCR  >>> neg 7/12  RVP >>>  ANTIBIOTICS: Ceftraixone 7/12 >>> Azithromycin 7/12 >>>  INTERVAL HISTORY:  COOX improved.  Minimal sedation required.  VITAL SIGNS: Temp:  [96.2 F (35.7 C)-98.3 F (36.8 C)] 97.9 F (36.6 C) (07/15 0405) Pulse Rate:  [28-154] 87 (07/15 0758) Resp:  [17-30] 27 (07/15 0758) BP: (84-124)/(49-95) 124/95 mmHg (07/15 0758) SpO2:  [95 %-100 %] 99 % (07/15 0758) FiO2 (%):  [40 %] 40 % (07/15 0758) Weight:  [46.2 kg (101 lb 13.6 oz)] 46.2 kg (101 lb 13.6 oz) (07/15 0500)  HEMODYNAMICS: CVP:  [9 mmHg-14 mmHg] 9 mmHg  VENTILATOR SETTINGS: Vent Mode:  [-] CPAP;PSV FiO2 (%):  [40 %] 40 % Set Rate:  [18 bmp] 18 bmp Vt Set:  [360 mL] 360 mL PEEP:  [5 cmH20] 5 cmH20 Pressure Support:  [5 cmH20] 5 cmH20 Plateau Pressure:  [15 cmH20-18 cmH20] 17 cmH20  INTAKE / OUTPUT: Intake/Output     07/14 0701 - 07/15 0700 07/15 0701 - 07/16 0700   I.V. (mL/kg) 913.9 (19.8)    NG/GT 875    IV Piggyback 300    Total Intake(mL/kg) 2088.9 (45.2)    Urine (mL/kg/hr) 2480 (2.2)    Stool 1 (0)    Total Output 2481     Net -392.2          Stool Occurrence 1 x     PHYSICAL EXAMINATION: General:  Marginal performance on SBT Neuro:  Awake, alert, following commands HEENT:  PERRL, OETT / OGT Cardiovascular:   Regular, no m/r/g Lungs:  Bilateral diminished air entry, scattered rales Abdomen:  Soft, nontender, bowel sounds diminished Musculoskeletal:  Moves all extremities, no edema Skin:  Intact  LABS: CBC  Recent Labs Lab 05/09/14 0430 05/10/14 0545 05/11/14 0500  WBC 12.2* 17.3* 11.8*  HGB 11.9* 12.9 11.8*  HCT 37.6 40.3 37.1  PLT 219 317 255   Coag's  Recent Labs Lab 05/07/14 1749  APTT 42*  INR 1.63*   BMET  Recent Labs Lab 05/10/14 0545 05/10/14 1800 05/11/14 0500  NA 142 145 143  K 4.4 3.6* 3.3*  CL 100 103 99  CO2 25 29 29   BUN 57* 53* 50*  CREATININE 0.91 0.80 0.81  GLUCOSE 149* 137* 120*   Electrolytes  Recent Labs Lab 05/09/14 0430  05/10/14 0545 05/10/14 1800 05/11/14 0500  CALCIUM 8.5  < > 8.4 8.1* 8.4  MG 2.3  --  2.5  --  2.3  PHOS 3.4  --  3.8  --  3.1  < > = values in this interval not displayed. Sepsis Markers  Recent Labs Lab 05/08/14 0135 05/08/14 0145 05/09/14 0430 05/09/14 0858 05/10/14 0545  LATICACIDVEN 5.7*  --   --  1.0  --   PROCALCITON  --  0.42 0.41  --  0.33   ABG  Recent Labs Lab 05/07/14 2204 05/08/14 0100 05/08/14 0507  PHART 7.269* 7.235* 7.304*  PCO2ART 48.3* 49.5* 35.9  PO2ART 168.0* 312.0* 122.0*   Liver Enzymes  Recent Labs Lab 05/08/14 0155  AST 61*  ALT 32  ALKPHOS 96  BILITOT 0.3  ALBUMIN 3.2*   Cardiac Enzymes  Recent Labs Lab 05/07/14 0920 05/07/14 1625 05/07/14 2058 05/08/14 0155 05/09/14 0430  TROPONINI  --  1.59* 1.54* 2.18*  --   PROBNP 15676.0*  --   --   --  23926.0*   Glucose  Recent Labs Lab 05/10/14 0804 05/10/14 1206 05/10/14 1540 05/10/14 1919 05/10/14 2322 05/11/14 0338  GLUCAP 117* 164* 126* 137* 112* 104*   IMAGING:  Dg Chest Port 1 View  05/10/2014   CLINICAL DATA:  Follow-up pulmonary edema.  EXAM: PORTABLE CHEST - 1 VIEW  COMPARISON:  05/09/2014.  FINDINGS: Endotracheal tube tip 3.6 cm above the carina.  Nasogastric tube courses below the diaphragm.  Tip is not included on the present exam.  Right central line tip distal superior vena cava/ cavoatrial junction level.  Cardiomegaly.  Calcified tortuous aorta.  Pulmonary edema similar to the prior exam.  No segmental consolidation. Evaluation left lung base limited by cardiomegaly.  Shoulder joint degenerative changes. Possible bone infarct proximal left humerus.  IMPRESSION: No significant change in degree of pulmonary edema. Please see above.   Electronically Signed   By: Chauncey Cruel M.D.   On: 05/10/2014 08:17   ASSESSMENT / PLAN:  PULMONARY A:  Acute respiratory failure Pulmonary edema Possible pneumonia P:   Goal pH>7.30, SpO2>92 Continuous mechanical support VAP bundle Daily SBT and extubation assessment Trend ABG/CXR  CARDIOVASCULAR A:  Acute on chronic systolic heart failure Cardiogenic shock AF/ RVR, now in SR with PACs VT P:  Goal MAP > 65 Cardiology following ASA BB / ACEI contraindicated - shock Levophed / Milrinone gtt Amiodarone gtt Trend COOX  RENAL A:   Stay positive fluid balance, but finally negative 400 mL over last 24 hours Hypokalemia, diuresis related P:   Trend BMP q12h Lasix increase to 80 mg IV q8h by Cardiology K 40 x 2  GASTROINTESTINAL A:   Nutrition GI Px P:   TF per Nutritionist Protonix  HEMATOLOGIC A: Mild anemia No overt hemorrhage Heparinization for ACS P:  Trend CBC Heparin gtt  INFECTIOUS A: Possible CAP P:   Abx / cx as above  ENDOCRINE A:   Adrenal insufficiency ruled out ( cortisol 113 ) P:   SSI  NEUROLOGIC A:   Acute encephalopathy P:   Fentanyl PRN  I have personally obtained history, examined patient, evaluated and interpreted laboratory and imaging results, reviewed medical records, formulated assessment / plan and placed orders.  CRITICAL CARE:  The patient is critically ill with multiple organ systems failure and requires high complexity decision making for assessment and support, frequent  evaluation and titration of therapies, application of advanced monitoring technologies and extensive interpretation of multiple databases. Critical Care Time devoted to patient care services described in this note is 35 minutes.   Doree Fudge, MD Pulmonary and [place] Pager: [phone]  05/11/2014, 8:06 AM

## 2014-05-11 NOTE — Plan of Care (Signed)
Problem: Phase I Progression Outcomes Goal: Hemodynamically stable Outcome: Progressing Progressing. Levophed off at 1900 and milrinone at 0.25. Co-ox 63.5 this am.

## 2014-05-11 NOTE — Progress Notes (Signed)
Advanced Heart Failure Rounding Note   Subjective:   Jacqueline Reilly) is an 80 y/o woman with CAD, AAA, systolic HF EF [id number], dementia, and CAD and has had two previous coronary interventions including a cutting balloon to her D2 in 2007 and a DES to her mid LCX in 2010. Her LV dysfunction has been out of proportion to her CAD.    Admitted 05/07/14 with syncope and dyspnea. She had new onset A-fib RVR. Troponin was 1.03>  1.59> 2.18  .   She was placed on amiodarone and heparin drip. She developed respiratory distress and required intubation. She converted to NSR but later was bradycardiac with NSVT. Amiodarone temporarily stopped but restarted after hyperkalemia resolved. Hyperkalemia was thought to be contributing to bradycardia.  Dopamine later added for hypotension. Diuresed with IV lasix.    Yesterday CO-OX was 29 % and she was started on Milrinone at 0.25 mcg . Levo added for low BP. She continues on amiodarone drip. Back into A fib. Creatinine stable.  Also had episode of bradycardia.30-40s. Remains intubated but follows commands.   Lower extremity doppler- negative for DVT  CO-OX 69%> 29>43>63 on Milrinone 0.25 mcg     Objective:   Weight Range:  Vital Signs:   Temp:  [96.2 F (35.7 C)-98.3 F (36.8 C)] 97.9 F (36.6 C) (07/15 0405) Pulse Rate:  [28-154] 70 (07/15 0600) Resp:  [17-30] 21 (07/15 0600) BP: (84-121)/(49-85) 110/85 mmHg (07/15 0600) SpO2:  [95 %-100 %] 100 % (07/15 0600) FiO2 (%):  [40 %] 40 % (07/15 0600) Weight:  [101 lb 13.6 oz (46.2 kg)] 101 lb 13.6 oz (46.2 kg) (07/15 0500) Last BM Date: 05/09/14  Weight change: Filed Weights   05/09/14 0400 05/10/14 0600 05/11/14 0500  Weight: 96 lb 5.5 oz (43.7 kg) 98 lb 12.3 oz (44.8 kg) 101 lb 13.6 oz (46.2 kg)    Intake/Output:   Intake/Output Summary (Last 24 hours) at 05/11/14 0753 Last data filed at 05/11/14 0600  Gross per 24 hour  Intake 2088.85 ml  Output   2481 ml  Net -392.15 ml     Physical  Exam:CVP 10  General:  Intubated. Awake  HEENT: normal Neck: supple. JVP to jaw  Carotids 2+ bilat; no bruits. No lymphadenopathy or thryomegaly appreciated. Cor: PMI nondisplaced. Regular rate & rhythm. No rubs, gallops or murmurs. Lungs: Rhonchi throughout Abdomen: soft, nontender, nondistended. No hepatosplenomegaly. No bruits or masses. Good bowel sounds. Extremities: Cool.  No cyanosis, clubbing, rash, edema Neuro: Intubated but following commands on vent.  moves all 4 extremities w/o difficulty.   Telemetry: SR 80s PVCs   Labs: Basic Metabolic Panel:  Recent Labs Lab 05/08/14 0155 05/08/14 0839 05/09/14 0430 05/09/14 1616 05/09/14 2340 05/10/14 0545 05/10/14 1800 05/11/14 0500  NA 142 144 144 144 138 142 145 143  K 5.8* 4.7 3.1* 5.4* 5.4* 4.4 3.6* 3.3*  CL 102 103 103 105 97 100 103 99  CO2 18* 24 29 27 23 25 29 29   GLUCOSE 164* 149* 127* 175* 311* 149* 137* 120*  BUN 41* 43* 47* 48* 52* 57* 53* 50*  CREATININE 1.16* 1.10 0.89 0.80 0.90 0.91 0.80 0.81  CALCIUM 8.7 9.9 8.5 8.7 8.4 8.4 8.1* 8.4  MG  --  2.5 2.3  --   --  2.5  --  2.3  PHOS  --  5.0* 3.4  --   --  3.8  --  3.1    Liver Function Tests:  Recent Labs Lab 05/08/14  0155  AST 61*  ALT 32  ALKPHOS 96  BILITOT 0.3  PROT 6.5  ALBUMIN 3.2*   No results found for this basename: LIPASE, AMYLASE,  in the last 168 hours No results found for this basename: AMMONIA,  in the last 168 hours  CBC:  Recent Labs Lab 05/07/14 2058 05/08/14 0155 05/09/14 0430 05/10/14 0545 05/11/14 0500  WBC 13.3* 16.1* 12.2* 17.3* 11.8*  NEUTROABS  --   --  9.8*  --   --   HGB 16.8* 15.4* 11.9* 12.9 11.8*  HCT 53.4* 49.4* 37.6 40.3 37.1  MCV 87.4 87.6 85.8 85.2 83.9  PLT 337 291 219 317 255    Cardiac Enzymes:  Recent Labs Lab 05/07/14 1625 05/07/14 2058 05/08/14 0155  TROPONINI 1.59* 1.54* 2.18*    BNP: BNP (last 3 results)  Recent Labs  04/28/14 1107 05/07/14 0920 05/09/14 0430  PROBNP 15377.0*  15676.0* 23926.0*     Other results:  :   Imaging: Dg Chest Port 1 View  05/10/2014   CLINICAL DATA:  Follow-up pulmonary edema.  EXAM: PORTABLE CHEST - 1 VIEW  COMPARISON:  05/09/2014.  FINDINGS: Endotracheal tube tip 3.6 cm above the carina.  Nasogastric tube courses below the diaphragm. Tip is not included on the present exam.  Right central line tip distal superior vena cava/ cavoatrial junction level.  Cardiomegaly.  Calcified tortuous aorta.  Pulmonary edema similar to the prior exam.  No segmental consolidation. Evaluation left lung base limited by cardiomegaly.  Shoulder joint degenerative changes. Possible bone infarct proximal left humerus.  IMPRESSION: No significant change in degree of pulmonary edema. Please see above.   Electronically Signed   By: Chauncey Cruel M.D.   On: 05/10/2014 08:17     Medications:     Scheduled Medications: . antiseptic oral rinse  15 mL Mouth Rinse QID  . aspirin  81 mg Oral Daily  . azithromycin  500 mg Intravenous QHS  . calcium gluconate  1 g Intravenous Once  . cefTRIAXone (ROCEPHIN)  IV  1 g Intravenous QHS  . chlorhexidine  15 mL Mouth Rinse BID  . furosemide  40 mg Intravenous TID  . insulin aspart  0-9 Units Subcutaneous 6 times per day  . multivitamin with minerals  1 tablet Oral Daily  . pantoprazole (PROTONIX) IV  40 mg Intravenous QHS  . potassium chloride  40 mEq Per Tube Once  . potassium chloride      . sodium chloride  10-40 mL Intracatheter Q12H  . sodium chloride  3 mL Intravenous Q12H    Infusions: . sodium chloride 10 mL/hr at 05/11/14 0600  . amiodarone 30 mg/hr (05/11/14 0618)  . feeding supplement (VITAL AF 1.2 CAL) 1,000 mL (05/11/14 0600)  . heparin 700 Units/hr (05/11/14 0600)  . milrinone 0.25 mcg/kg/min (05/11/14 0600)  . norepinephrine (LEVOPHED) Adult infusion 2 mcg/min (05/11/14 0600)    PRN Medications: sodium chloride, fentaNYL, fentaNYL, ondansetron (ZOFRAN) IV, sodium chloride, sodium  chloride   Assessment:  1. A fib RVR 2. Syncope 3. NSTEMI 4. Acute Respiratory Failure- Intubated  5. Acute/Chronic Systolic Heart Failure: EF 25-30%, LV dysfunction has been out of proportion to CAD.  6. Hyperkalemia  7. Early Dementia 8. Hypokalemia 9. CAD 10. ?PNA: on antibiotics.  11. Limited Code- No cpr . No defibrillation.    Plan/Discussion:    CCM following. Recommendations appreciated.   Back in A fib with controlled rate.  Continue amiodarone drip and Heparin. Lower extremity doppler negative.  Net negative with diuresis yesterday but not markedly.  Remains volume up with CVP 10-12. Continue IV lasix. CO-OX much better. Continue Milrinone 0.25 mcg as well as Levo for low  BP.   Supplement potassium    Length of Stay: 4  CLEGG,AMY 05/11/2014, 7:53 AM  Advanced Heart Failure Team Pager [phone] (M-F; 7a - 4p)  Please contact [facility] for night-coverage after hours (4p -7a ) and weekends on [email or web address]  Patient seen with NP, agree with the above note.  Co-ox improved today on milrinone + low dose norepinephrine. BUN/creatinine stable.  She had better UOP yesterday but CVP remains elevated.  I would like to see more fluid off prior to extubation.  Discussed with Dr. Gar Gibbon, will increase Lasix to 80 mg IV every 8 hours today and anticipate potential extubation tomorrow morning.   Patient is back in atrial fibrillation but rate is controlled on IV amiodarone.  Continue this along with heparin gtt for now.   Jacqueline Reilly 05/11/2014 8:34 AM

## 2014-05-12 ENCOUNTER — Inpatient Hospital Stay (HOSPITAL_COMMUNITY): Payer: Medicare Other

## 2014-05-12 LAB — GLUCOSE, CAPILLARY
Glucose-Capillary: 119 mg/dL — ABNORMAL HIGH (ref 70–99)
Glucose-Capillary: 122 mg/dL — ABNORMAL HIGH (ref 70–99)
Glucose-Capillary: 150 mg/dL — ABNORMAL HIGH (ref 70–99)
Glucose-Capillary: 151 mg/dL — ABNORMAL HIGH (ref 70–99)
Glucose-Capillary: 184 mg/dL — ABNORMAL HIGH (ref 70–99)
Glucose-Capillary: 95 mg/dL (ref 70–99)

## 2014-05-12 LAB — CBC
HCT: 36.5 % (ref 36.0–46.0)
Hemoglobin: 11.6 g/dL — ABNORMAL LOW (ref 12.0–15.0)
MCH: 27.1 pg (ref 26.0–34.0)
MCHC: 31.8 g/dL (ref 30.0–36.0)
MCV: 85.3 fL (ref 78.0–100.0)
Platelets: 222 10*3/uL (ref 150–400)
RBC: 4.28 MIL/uL (ref 3.87–5.11)
RDW: 15.3 % (ref 11.5–15.5)
WBC: 10.2 10*3/uL (ref 4.0–10.5)

## 2014-05-12 LAB — RESPIRATORY VIRUS PANEL
Adenovirus: NOT DETECTED
Influenza A H1: NOT DETECTED
Influenza A H3: NOT DETECTED
Influenza A: NOT DETECTED
Influenza B: NOT DETECTED
Metapneumovirus: NOT DETECTED
Parainfluenza 1: NOT DETECTED
Parainfluenza 2: NOT DETECTED
Parainfluenza 3: NOT DETECTED
Respiratory Syncytial Virus A: NOT DETECTED
Respiratory Syncytial Virus B: NOT DETECTED
Rhinovirus: NOT DETECTED

## 2014-05-12 LAB — CARBOXYHEMOGLOBIN
Carboxyhemoglobin: 1.3 % (ref 0.5–1.5)
Methemoglobin: 0.6 % (ref 0.0–1.5)
O2 Saturation: 55.3 %
Total hemoglobin: 11.5 g/dL — ABNORMAL LOW (ref 12.0–16.0)

## 2014-05-12 LAB — BASIC METABOLIC PANEL
Anion gap: 12 (ref 5–15)
Anion gap: 12 (ref 5–15)
Anion gap: 12 (ref 5–15)
BUN: 53 mg/dL — ABNORMAL HIGH (ref 6–23)
BUN: 52 mg/dL — ABNORMAL HIGH (ref 6–23)
BUN: 53 mg/dL — ABNORMAL HIGH (ref 6–23)
CO2: 32 mEq/L (ref 19–32)
CO2: 33 mEq/L — ABNORMAL HIGH (ref 19–32)
CO2: 36 mEq/L — ABNORMAL HIGH (ref 19–32)
Calcium: 8.5 mg/dL (ref 8.4–10.5)
Calcium: 8.7 mg/dL (ref 8.4–10.5)
Calcium: 9.1 mg/dL (ref 8.4–10.5)
Chloride: 102 mEq/L (ref 96–112)
Chloride: 95 mEq/L — ABNORMAL LOW (ref 96–112)
Chloride: 93 mEq/L — ABNORMAL LOW (ref 96–112)
Creatinine, Ser: 0.85 mg/dL (ref 0.50–1.10)
Creatinine, Ser: 0.79 mg/dL (ref 0.50–1.10)
Creatinine, Ser: 0.78 mg/dL (ref 0.50–1.10)
GFR calc Af Amer: 69 mL/min — ABNORMAL LOW (ref >90)
GFR calc Af Amer: 84 mL/min — ABNORMAL LOW (ref >90)
GFR calc Af Amer: 84 mL/min — ABNORMAL LOW (ref >90)
GFR calc non Af Amer: 59 mL/min — ABNORMAL LOW (ref >90)
GFR calc non Af Amer: 72 mL/min — ABNORMAL LOW (ref >90)
GFR calc non Af Amer: 73 mL/min — ABNORMAL LOW (ref >90)
Glucose, Bld: 86 mg/dL (ref 70–99)
Glucose, Bld: 134 mg/dL — ABNORMAL HIGH (ref 70–99)
Glucose, Bld: 123 mg/dL — ABNORMAL HIGH (ref 70–99)
Potassium: 4.1 mEq/L (ref 3.7–5.3)
Potassium: 3.7 mEq/L (ref 3.7–5.3)
Potassium: 3.6 mEq/L — ABNORMAL LOW (ref 3.7–5.3)
Sodium: 146 mEq/L (ref 137–147)
Sodium: 140 mEq/L (ref 137–147)
Sodium: 141 mEq/L (ref 137–147)

## 2014-05-12 LAB — HEPARIN LEVEL (UNFRACTIONATED)
Heparin Unfractionated: 0.25 IU/mL — ABNORMAL LOW (ref 0.30–0.70)
Heparin Unfractionated: 0.33 IU/mL (ref 0.30–0.70)

## 2014-05-12 LAB — MAGNESIUM: Magnesium: 2.4 mg/dL (ref 1.5–2.5)

## 2014-05-12 LAB — PHOSPHORUS: Phosphorus: 3.3 mg/dL (ref 2.3–4.6)

## 2014-05-12 MED ORDER — METOLAZONE 2.5 MG PO TABS
2.5000 mg | ORAL_TABLET | Freq: Once | ORAL | Status: AC
  Administered 2014-05-12: 2.5 mg via ORAL
  Filled 2014-05-12: qty 1

## 2014-05-12 MED ORDER — POTASSIUM CHLORIDE 20 MEQ/15ML (10%) PO LIQD
40.0000 meq | Freq: Once | ORAL | Status: AC
  Administered 2014-05-12: 40 meq
  Filled 2014-05-12: qty 30

## 2014-05-12 NOTE — Progress Notes (Signed)
ANTICOAGULATION CONSULT NOTE - Follow Up Consult  Pharmacy Consult for Heparin Indication: chest pain/ACS  Allergies  Allergen Reactions  . Codeine Nausea Only  . Garlic Nausea Only    Labs:  Recent Labs  05/10/14 0545  05/11/14 0500 05/11/14 1150 05/11/14 1700 05/11/14 1900 05/12/14 0340 05/12/14 1250  HGB 12.9  --  11.8*  --   --   --  11.6*  --   HCT 40.3  --  37.1  --   --   --  36.5  --   PLT 317  --  255  --   --   --  222  --   HEPARINUNFRC 0.26*  --  0.28* 0.33  --   --  0.25* 0.33  CREATININE 0.91  < > 0.81  --  0.82 0.84 0.85  --   < > = values in this interval not displayed. Estimated Creatinine Clearance: 32.1 ml/min (by C-G formula based on Cr of 0.85).  Assessment: 80yo female presents with near syncopal episode according to son.  She was found to be in Afib with RVR in the ED. Her troponins were elevated as well as a significant elevation in her BNP.    Continues in afib (amio gtt continued), off pressors but continues on milrinone.  HL still slightly low at 0.25 - increased to 900/hr and recheck level at noon. CBC trending down but no bleeding complications noted.  Cultures are no growth to date currently on azith/ceftriaxone  Goal of Therapy:  Heparin level 0.3-0.7 units/ml Monitor platelets by anticoagulation protocol: Yes   Plan:  Increase heparin to 1000 units / hr - recheck level in am Daily heparin level, CBC  Erin Hearing PharmD., BCPS Clinical Pharmacist Pager [phone] 05/12/2014 2:02 PM

## 2014-05-12 NOTE — Progress Notes (Addendum)
Advanced Heart Failure Rounding Note   Subjective:   [name] is an 80 y/o woman with CAD, AAA, systolic HF EF [id number], dementia, and CAD and has had two previous coronary interventions including a cutting balloon to her D2 in 2007 and a DES to her mid LCX in 2010. Her LV dysfunction has been out of proportion to her CAD.    Admitted 05/07/14 with syncope and dyspnea. She had new onset A-fib RVR. Troponin was 1.03>  1.59> 2.18  .   She was placed on amiodarone and heparin drip. She developed respiratory distress and required intubation. She converted to NSR but later was bradycardiac with NSVT. Amiodarone temporarily stopped but restarted after hyperkalemia resolved. Hyperkalemia was thought to be contributing to bradycardia.  Dopamine later added for hypotension. Diuresed with IV lasix.    She was started on Milrinone 7/14 due to CO-OX 29 %.  Levo added for low BP. She continues on amiodarone drip for A fib. She has also had episodes of bradycardia.30-40s. Yesterday IV lasix was increased. Weight down 3 pounds. CVP down to 9-10.  Back in NSR Remains intubated but follows commands.   Lower extremity doppler- negative for DVT  CO-OX 69%> 29>43>63 on Milrinone 0.25 mcg > CO-OX 55%   Objective:   Weight Range:  Vital Signs:   Temp:  [96.1 F (35.6 C)-98.4 F (36.9 C)] 97.6 F (36.4 C) (07/16 0350) Pulse Rate:  [36-124] 59 (07/16 0500) Resp:  [18-38] 19 (07/16 0500) BP: (91-145)/(59-124) 94/61 mmHg (07/16 0500) SpO2:  [95 %-100 %] 98 % (07/16 0500) FiO2 (%):  [40 %] 40 % (07/16 0500) Weight:  [98 lb 1.7 oz (44.5 kg)] 98 lb 1.7 oz (44.5 kg) (07/16 0500) Last BM Date: 05/09/14  Weight change: Filed Weights   05/10/14 0600 05/11/14 0500 05/12/14 0500  Weight: 98 lb 12.3 oz (44.8 kg) 101 lb 13.6 oz (46.2 kg) 98 lb 1.7 oz (44.5 kg)    Intake/Output:   Intake/Output Summary (Last 24 hours) at 05/12/14 0649 Last data filed at 05/12/14 0600  Gross per 24 hour  Intake 2530.58 ml   Output   1765 ml  Net 765.58 ml     Physical Exam:CVP 9-10 General:  Intubated. Awake  HEENT: normal Neck: supple. JVP to jaw  Carotids 2+ bilat; no bruits. No lymphadenopathy or thryomegaly appreciated. Cor: PMI nondisplaced. Regular rate & rhythm. No rubs, gallops or murmurs. Lungs: Rhonchi throughout Abdomen: soft, nontender, nondistended. No hepatosplenomegaly. No bruits or masses. Good bowel sounds. Extremities: Cool.  No cyanosis, clubbing, rash, edema Neuro: Intubated but following commands on vent.  moves all 4 extremities w/o difficulty.   Telemetry: SR 80s PVCs   Labs: Basic Metabolic Panel:  Recent Labs Lab 05/08/14 0839 05/09/14 0430  05/10/14 0545 05/10/14 1800 05/11/14 0500 05/11/14 1700 05/11/14 1900 05/12/14 0340  NA 144 144  < > 142 145 143 144 143 146  K 4.7 3.1*  < > 4.4 3.6* 3.3* 6.4* 5.5* 4.1  CL 103 103  < > 100 103 99 103 102 102  CO2 24 29  < > 25 29 29 27 28  32  GLUCOSE 149* 127*  < > 149* 137* 120* 137* 135* 86  BUN 43* 47*  < > 57* 53* 50* 54* 55* 53*  CREATININE 1.10 0.89  < > 0.91 0.80 0.81 0.82 0.84 0.85  CALCIUM 9.9 8.5  < > 8.4 8.1* 8.4 8.6 8.6 8.5  MG 2.5 2.3  --  2.5  --  2.3  --   --  2.4  PHOS 5.0* 3.4  --  3.8  --  3.1  --   --  3.3  < > = values in this interval not displayed.  Liver Function Tests:  Recent Labs Lab 05/08/14 0155  AST 61*  ALT 32  ALKPHOS 96  BILITOT 0.3  PROT 6.5  ALBUMIN 3.2*   No results found for this basename: LIPASE, AMYLASE,  in the last 168 hours No results found for this basename: AMMONIA,  in the last 168 hours  CBC:  Recent Labs Lab 05/08/14 0155 05/09/14 0430 05/10/14 0545 05/11/14 0500 05/12/14 0340  WBC 16.1* 12.2* 17.3* 11.8* 10.2  NEUTROABS  --  9.8*  --   --   --   HGB 15.4* 11.9* 12.9 11.8* 11.6*  HCT 49.4* 37.6 40.3 37.1 36.5  MCV 87.6 85.8 85.2 83.9 85.3  PLT 291 219 317 255 222    Cardiac Enzymes:  Recent Labs Lab 05/07/14 1625 05/07/14 2058 05/08/14 0155   TROPONINI 1.59* 1.54* 2.18*    BNP: BNP (last 3 results)  Recent Labs  04/28/14 1107 05/07/14 0920 05/09/14 0430  PROBNP 15377.0* 15676.0* 23926.0*     Other results:  :   Imaging: No results found.   Medications:     Scheduled Medications: . antiseptic oral rinse  15 mL Mouth Rinse QID  . aspirin  81 mg Oral Daily  . azithromycin  500 mg Intravenous QHS  . calcium gluconate  1 g Intravenous Once  . cefTRIAXone (ROCEPHIN)  IV  1 g Intravenous QHS  . chlorhexidine  15 mL Mouth Rinse BID  . furosemide  80 mg Intravenous TID  . insulin aspart  0-9 Units Subcutaneous 6 times per day  . multivitamin with minerals  1 tablet Oral Daily  . pantoprazole (PROTONIX) IV  40 mg Intravenous QHS  . sodium chloride  10-40 mL Intracatheter Q12H  . sodium chloride  3 mL Intravenous Q12H    Infusions: . sodium chloride 20 mL/hr at 05/12/14 0600  . amiodarone 30 mg/hr (05/12/14 0600)  . feeding supplement (VITAL AF 1.2 CAL) 1,000 mL (05/12/14 0600)  . heparin 800 Units/hr (05/12/14 0600)  . milrinone 0.25 mcg/kg/min (05/12/14 0600)  . norepinephrine (LEVOPHED) Adult infusion Stopped (05/11/14 1900)    PRN Medications: sodium chloride, fentaNYL, fentaNYL, ondansetron (ZOFRAN) IV, sodium chloride, sodium chloride   Assessment:  1. A fib RVR 2. Syncope 3. NSTEMI 4. Acute Respiratory Failure- Intubated  5. Acute/Chronic Systolic Heart Failure: EF 25-30%, LV dysfunction has been out of proportion to CAD.  6. Hyperkalemia  7. Early Dementia 8. Hypokalemia 9. CAD 10. ?PNA: on antibiotics.  11. Limited Code- No cpr . No defibrillation.    Plan/Discussion:    CCM following. Recommendations appreciated.   Back in NSR.  Continue amiodarone drip and Heparin. Lower extremity doppler negative.   Weight down 3 pounds. Remains volume overloaded. CVP 10. Continue IV lasix. Give 2.5 mg metolazone x1.  CO-OX 55%. Continue Milrinone 0.25 mcg.  May need long term milrinone. BP ok  off Levo   Length of Stay: 5  CLEGG,AMY NP-C  05/12/2014, 6:49 AM  Advanced Heart Failure Team Pager [phone] (M-F; [place])  Please contact [facility] for night-coverage after hours (4p -7a ) and weekends on [email or web address]  Patient seen with NP, agree with the above note.  Co-ox stable this morning on milrinone.  Weight down but diuresis not marked, still with pulmonary edema on  CXR and with elevated CVP.  She is back in NSR and off norepinephrine.  Renal function remains stable.  I will add metolazone 2.5 mg x 1 today with goal of facilitating more diuresis.  If she looks good early this afternoon, potential extubation today. Will need to make decision about what to do if she does poorly post-extubation.  Long discussion with family this morning.   Loralie Champagne 05/12/2014 8:24 AM

## 2014-05-12 NOTE — Progress Notes (Signed)
ANTICOAGULATION CONSULT NOTE - Follow Up Consult  Pharmacy Consult for heparin  Indication: chest pain/ACS  Allergies  Allergen Reactions  . Codeine Nausea Only  . Garlic Nausea Only    Patient Measurements: Height: 5' (152.4 cm) Weight: 98 lb 1.7 oz (44.5 kg) IBW/kg (Calculated) : 45.5 Heparin Dosing Weight:   Vital Signs: Temp: 97.6 F (36.4 C) (07/16 0350) Temp src: Oral (07/16 0350) BP: 94/61 mmHg (07/16 0500) Pulse Rate: 59 (07/16 0500)  Labs:  Recent Labs  05/10/14 0545  05/11/14 0500 05/11/14 1150 05/11/14 1700 05/11/14 1900 05/12/14 0340  HGB 12.9  --  11.8*  --   --   --  11.6*  HCT 40.3  --  37.1  --   --   --  36.5  PLT 317  --  255  --   --   --  222  HEPARINUNFRC 0.26*  --  0.28* 0.33  --   --  0.25*  CREATININE 0.91  < > 0.81  --  0.82 0.84 0.85  < > = values in this interval not displayed.  Estimated Creatinine Clearance: 32.1 ml/min (by C-G formula based on Cr of 0.85).   Medications:  Heparin infusing at 800/hr   Assessment: Heparin level results at 0.25  IU/ml this am. No bleeding noted.  Goal of Therapy:  Heparin level 0.3-0.7 units/ml Monitor platelets by anticoagulation protocol: Yes   Plan:  Increase heparin to 900 units/hr and f/u daily labs 7/17  Curlene Dolphin 05/12/2014,6:47 AM

## 2014-05-12 NOTE — Progress Notes (Signed)
PULMONARY / CRITICAL CARE MEDICINE  Name: Jacqueline Reilly MRN: [id number] DOB: 04-05-1924    ADMISSION DATE:  05/07/2014 CONSULTATION DATE:  05/07/14  REFERRING MD :  Cardiology PRIMARY SERVICE: Cardiology  CHIEF COMPLAINT:  Respiratory failure  BRIEF PATIENT DESCRIPTION: 80 yo with CAD and CHF ( EF 25% ) admitted 7/11 with syncope, AF/RVR, pulmonary edema and acute respiratory failure.  Failed NIMV.  Intubated.  EVENTS / STUDIES: 7/12  Venous Doppler >>> NO DVT 7/12  TTE >>> EF 20%, no RWMA, PAP 38 torr  LINES / TUBES: OETT 7/11 >>> OGT 7/11 >>> R IJ TLC 7/11 >>> Foley 7/11 >>>  CULTURES: 7/12  Blood >>> 7/12  Respiratory >>> 7/11  MRSA PCR  >>> neg 7/12  RVP >>>  ANTIBIOTICS: Ceftraixone 7/12 >>> Azithromycin 7/12 >>>  INTERVAL HISTORY:  Off Levophed.  Still on Milrinone / Amiodarone. Last COOX =65.  VITAL SIGNS: Temp:  [96.1 F (35.6 C)-98.4 F (36.9 C)] 97.6 F (36.4 C) (07/16 0752) Pulse Rate:  [36-124] 59 (07/16 0500) Resp:  [18-38] 19 (07/16 0500) BP: (91-145)/(59-124) 94/61 mmHg (07/16 0500) SpO2:  [95 %-100 %] 98 % (07/16 0500) FiO2 (%):  [40 %] 40 % (07/16 0500) Weight:  [44.5 kg (98 lb 1.7 oz)] 44.5 kg (98 lb 1.7 oz) (07/16 0500)  HEMODYNAMICS: CVP:  [5 mmHg-17 mmHg] 5 mmHg  VENTILATOR SETTINGS: Vent Mode:  [-] PRVC FiO2 (%):  [40 %] 40 % Set Rate:  [18 bmp] 18 bmp Vt Set:  [360 mL] 360 mL PEEP:  [5 cmH20] 5 cmH20 Pressure Support:  [5 cmH20] 5 cmH20 Plateau Pressure:  [17 cmH20-20 cmH20] 18 cmH20  INTAKE / OUTPUT: Intake/Output     07/15 0701 - 07/16 0700 07/16 0701 - 07/17 0700   I.V. (mL/kg) 1096 (24.6)    NG/GT 1055    IV Piggyback 300    Total Intake(mL/kg) 2451 (55.1)    Urine (mL/kg/hr) 1820 (1.7)    Stool     Total Output 1820     Net +631           PHYSICAL EXAMINATION: General:  Comfortable Neuro:  Awake, alert, attempting to communicate HEENT:  PERRL, OETT / OGT Cardiovascular:  Regular, no m/r/g Lungs:  Rales / rhonchi  bilaterally Abdomen:  Soft, nontender, bowel sounds diminished Musculoskeletal:  Moves all extremities, no edema Skin:  Intact  LABS: CBC  Recent Labs Lab 05/10/14 0545 05/11/14 0500 05/12/14 0340  WBC 17.3* 11.8* 10.2  HGB 12.9 11.8* 11.6*  HCT 40.3 37.1 36.5  PLT 317 255 222   Coag's  Recent Labs Lab 05/07/14 1749  APTT 42*  INR 1.63*   BMET  Recent Labs Lab 05/11/14 1700 05/11/14 1900 05/12/14 0340  NA 144 143 146  K 6.4* 5.5* 4.1  CL 103 102 102  CO2 27 28 32  BUN 54* 55* 53*  CREATININE 0.82 0.84 0.85  GLUCOSE 137* 135* 86   Electrolytes  Recent Labs Lab 05/10/14 0545  05/11/14 0500 05/11/14 1700 05/11/14 1900 05/12/14 0340  CALCIUM 8.4  < > 8.4 8.6 8.6 8.5  MG 2.5  --  2.3  --   --  2.4  PHOS 3.8  --  3.1  --   --  3.3  < > = values in this interval not displayed. Sepsis Markers  Recent Labs Lab 05/08/14 0135 05/08/14 0145 05/09/14 0430 05/09/14 0858 05/10/14 0545  LATICACIDVEN 5.7*  --   --  1.0  --  PROCALCITON  --  0.42 0.41  --  0.33   ABG  Recent Labs Lab 05/07/14 2204 05/08/14 0100 05/08/14 0507  PHART 7.269* 7.235* 7.304*  PCO2ART 48.3* 49.5* 35.9  PO2ART 168.0* 312.0* 122.0*   Liver Enzymes  Recent Labs Lab 05/08/14 0155  AST 61*  ALT 32  ALKPHOS 96  BILITOT 0.3  ALBUMIN 3.2*   Cardiac Enzymes  Recent Labs Lab 05/07/14 0920 05/07/14 1625 05/07/14 2058 05/08/14 0155 05/09/14 0430  TROPONINI  --  1.59* 1.54* 2.18*  --   PROBNP 15676.0*  --   --   --  23926.0*   Glucose  Recent Labs Lab 05/11/14 0804 05/11/14 1207 05/11/14 1602 05/11/14 1927 05/11/14 2327 05/12/14 0332  GLUCAP 163* 196* 142* 135* 188* 95   IMAGING:  No results found. ASSESSMENT / PLAN:  PULMONARY A:  Acute respiratory failure Pulmonary edema Possible pneumonia P:   Goal pH>7.30, SpO2>92 Continuous mechanical support VAP bundle Daily SBT, volume status is limiting extubation, but may attempt given improved cardiac  output / rhythm Need to have clear plan if extubation failes Trend ABG/CXR  CARDIOVASCULAR A:  Acute on chronic systolic heart failure Cardiogenic shock AF/ RVR, now in SR with PACs VT runs P:  Goal MAP > 65 Cardiology following ASA BB / ACEI contraindicated - shock Levophed gtt OFF Milrinone gtt Amiodarone gtt Trend COOX  RENAL A:   Still positive fluid balance P:   Trend BMP q12h Lasix 80 mg IV q8h Metolazone ?  GASTROINTESTINAL A:   Nutrition GI Px P:   TF per Nutritionist Protonix  HEMATOLOGIC A: Mild anemia No overt hemorrhage Heparinization for ACS / AF P:  Trend CBC Heparin gtt  INFECTIOUS A: Possible CAP P:   Abx / cx as above  ENDOCRINE A:   Adrenal insufficiency ruled out ( cortisol 113 ) P:   SSI  NEUROLOGIC A:   Acute encephalopathy P:   Fentanyl PRN  I have personally obtained history, examined patient, evaluated and interpreted laboratory and imaging results, reviewed medical records, formulated assessment / plan and placed orders.  CRITICAL CARE:  The patient is critically ill with multiple organ systems failure and requires high complexity decision making for assessment and support, frequent evaluation and titration of therapies, application of advanced monitoring technologies and extensive interpretation of multiple databases. Critical Care Time devoted to patient care services described in this note is 35 minutes.   Doree Fudge, MD Pulmonary and [place] Pager: [phone]  05/12/2014, 7:53 AM

## 2014-05-12 NOTE — Plan of Care (Signed)
Problem: Phase I Progression Outcomes Goal: VTE prophylaxis Outcome: Progressing Heparin gtt Goal: Optimized method of communication. Outcome: Progressing Pt nods head appropriately and uses nonverbal communication (writing on paper)

## 2014-05-12 NOTE — Progress Notes (Signed)
Dr. Earnest Conroy in [facility] made aware that CVP reading done at 2100 was 3 and pt has diuresed 3.5L today. Okay to give 2200 dose of 80mg  IV lasix and re-check CVP in am as long as BP remains stable.  Will continue to monitor.  Rolla Plate

## 2014-05-12 NOTE — Progress Notes (Signed)
NUTRITION FOLLOW UP  INTERVENTION: Continue current TF regimen RD to follow for nutrition care plan  NUTRITION DIAGNOSIS: Inadequate oral intake related to inability to eat as evidenced by NPO status, ongoing  Goal: Pt to meet >/= 90% of their estimated nutrition needs, met  Monitor:  TF regimen & tolerance, respiratory status, weight, labs, I/O's  ASSESSMENT: 80 yo with CAD and CHF ( EF 25% ) admitted 7/11 with syncope, AF/RVR, pulmonary edema and acute respiratory failure. Failed NIMV. Intubated.  Patient is currently intubated on ventilator support MV: 8.4 L/min Temp (24hrs), Avg:97.8 F (36.6 C), Min:97.4 F (36.3 C), Max:98.4 F (36.9 C)   Vital AF 1.2 formula currently infusing at 35 ml/hr via OGT providing 1008 kcal, 63 gm of protein, and 681 ml free water.  Off Levophed 7/15.  Pt remains volume overloaded.  + IV Lasix.     Height: Ht Readings from Last 1 Encounters:  05/07/14 5' (1.524 m)    Weight: Wt Readings from Last 1 Encounters:  05/12/14 98 lb 1.7 oz (44.5 kg)    7/15  101 lb 7/14    98 lb 7/13    96 lb 7/12    94 lb 7/11    92 lb  BMI:  Body mass index is 19.16 kg/(m^2).  Estimated Nutritional Needs: Kcal: 1100-1250 Protein: 65-75 gm Fluid: per MD  Skin: intact  Diet Order: NPO   Intake/Output Summary (Last 24 hours) at 05/12/14 1358 Last data filed at 05/12/14 1000  Gross per 24 hour  Intake 2107.1 ml  Output   1940 ml  Net  167.1 ml    Labs:   Recent Labs Lab 05/10/14 0545  05/11/14 0500 05/11/14 1700 05/11/14 1900 05/12/14 0340  NA 142  < > 143 144 143 146  K 4.4  < > 3.3* 6.4* 5.5* 4.1  CL 100  < > 99 103 102 102  CO2 25  < > 29 27 28  32  BUN 57*  < > 50* 54* 55* 53*  CREATININE 0.91  < > 0.81 0.82 0.84 0.85  CALCIUM 8.4  < > 8.4 8.6 8.6 8.5  MG 2.5  --  2.3  --   --  2.4  PHOS 3.8  --  3.1  --   --  3.3  GLUCOSE 149*  < > 120* 137* 135* 86  < > = values in this interval not displayed.  CBG (last 3)   Recent  Labs  05/12/14 0332 05/12/14 0750 05/12/14 1155  GLUCAP 95 150* 184*    Scheduled Meds: . antiseptic oral rinse  15 mL Mouth Rinse QID  . aspirin  81 mg Oral Daily  . azithromycin  500 mg Intravenous QHS  . cefTRIAXone (ROCEPHIN)  IV  1 g Intravenous QHS  . chlorhexidine  15 mL Mouth Rinse BID  . furosemide  80 mg Intravenous TID  . insulin aspart  0-9 Units Subcutaneous 6 times per day  . multivitamin with minerals  1 tablet Oral Daily  . pantoprazole (PROTONIX) IV  40 mg Intravenous QHS  . sodium chloride  10-40 mL Intracatheter Q12H  . sodium chloride  3 mL Intravenous Q12H    Continuous Infusions: . sodium chloride 20 mL/hr at 05/12/14 0600  . amiodarone 30 mg/hr (05/12/14 1000)  . feeding supplement (VITAL AF 1.2 CAL) 1,000 mL (05/12/14 1000)  . heparin 900 Units/hr (05/12/14 1000)  . milrinone 0.25 mcg/kg/min (05/12/14 1000)  . norepinephrine (LEVOPHED) Adult infusion Stopped (05/11/14 1900)  Past Medical History  Diagnosis Date  . PVD (peripheral vascular disease)   . Ventricular dysfunction     left; ischemic  . Hyperlipidemia   . AAA (abdominal aortic aneurysm)   . Renal artery stenosis   . Hypertension   . Coronary artery disease 2007    moderate ASCAD of the left system s/p PCI of the D2 and PCI of the left circ 03/2009  . CHF (congestive heart failure)     Past Surgical History  Procedure Laterality Date  . Retinal cryopexy      right eye (for retinal detachment)  . Angioplasty    . Coronary stent placement      Arthur Holms, RD, LDN Pager #: [phone] After-Hours Pager #: [phone]

## 2014-05-12 NOTE — Care Management Note (Addendum)
Page 1 of 2   05/19/2014     11:26:30 AM CARE MANAGEMENT NOTE 05/19/2014  Patient:  Jacqueline Reilly, Jacqueline Reilly   Account Number:  [id number]  Date Initiated:  05/10/2014  Documentation initiated by:  [facility]  Subjective/Objective Assessment:   Admitted with Pulm Edema.  On vent     Action/Plan:   Anticipated DC Date:  05/20/2014   Anticipated DC Plan:  [place]  CM consult      [facility] Choice  Resumption Of Svcs/PTA Provider  DURABLE MEDICAL EQUIPMENT   Choice offered to / List presented to:  C-1 Patient   DME arranged  OXYGEN  [facility] - [facility]  IV PUMP/EQUIPMENT      DME agency  [place] arranged  HH-1 RN  [place].   Status of service:  Completed, signed off Medicare Important Message given?  YES (If response is "NO", the following Medicare IM given date fields will be blank) Date Medicare IM given:  05/12/2014 Medicare IM given by:  [facility] Date Additional Medicare IM given:  05/17/2014 Additional Medicare IM given by:  Elissa Hefty  Discharge Disposition:  [place]  Per UR Regulation:  Reviewed for med. necessity/level of care/duration of stay  If discussed at [place] of Stay Meetings, dates discussed:   05/19/2014    Comments:  Contact:  Jacqueline Reilly,Jacqueline Reilly [phone]                Jacqueline Reilly, Jacqueline Reilly [phone] 7/23 1124a debbie Deforest Maiden rn,bsn met w pt and son Jacqueline Reilly today. we discussed hhc and pt wants to remain w [facility]. we talked about home milrinone. son from [place] but will be here thru middle of august. son aware that sitter/priv duty not covered under medicare and he will work on getting help hired for pt. pt for home o2 and [employer]. ahc will work on this eq. pam w ahc aware that pt will be on home iv milrinone. plans for dc  on 7-24.  7/21 1335 debbie Avett Reineck rn,bsn pt will have 267.31 out of  pocket for eliquis then will have to pay 161.25 copay after that.spoke w pt and husband, explianed that 24hr care is out of pocket but did leave sitter/priv duty list. left hhc agency list for hhr/pt for them to look over. pt has rw that is old but they would like rolator rw at disch. await orders.  patient is active at home with [facility] for RN and PT services. Off milrinone today - will see how she does. Patient does not want to go anywhere but home - would like sitters 24/7 and agreeable to pay privately.  Luz Lex, RNBSN [phone]  05-16-14  3:50pm Shanna Cisco [phone] Talked with husband and patient.  Patient sitting in chair - on [place] oxygen. Talked with physician about possible [facility] option.  Agreeable to talk to patient and start process. Discussed discharge options for rehab - patient is still on milrinone.  Talked to about Ltachs - patient just wants to go home.  Discussed safety and possible drip if still needs.  Still wants to wait to see how she does.  05-11-14 10am Luz Lex, RNBSN [phone] Remains on vent, pressors, amio, milrinone and heparin.  EF 20%. Talked with son, Jacqueline Reilly and patient.  patient lives at  home with husband prior to admission and son states independent. CM will continue to follow for further needs.  05-10-14 1:20pm Luz Lex, RNBSN [phone] Remains on vent.

## 2014-05-13 LAB — BASIC METABOLIC PANEL
Anion gap: 13 (ref 5–15)
BUN: 54 mg/dL — ABNORMAL HIGH (ref 6–23)
CO2: 35 mEq/L — ABNORMAL HIGH (ref 19–32)
Calcium: 9 mg/dL (ref 8.4–10.5)
Chloride: 94 mEq/L — ABNORMAL LOW (ref 96–112)
Creatinine, Ser: 0.88 mg/dL (ref 0.50–1.10)
GFR calc Af Amer: 66 mL/min — ABNORMAL LOW (ref >90)
GFR calc non Af Amer: 57 mL/min — ABNORMAL LOW (ref >90)
Glucose, Bld: 105 mg/dL — ABNORMAL HIGH (ref 70–99)
Potassium: 4.3 mEq/L (ref 3.7–5.3)
Sodium: 142 mEq/L (ref 137–147)

## 2014-05-13 LAB — GLUCOSE, CAPILLARY
Glucose-Capillary: 104 mg/dL — ABNORMAL HIGH (ref 70–99)
Glucose-Capillary: 107 mg/dL — ABNORMAL HIGH (ref 70–99)
Glucose-Capillary: 106 mg/dL — ABNORMAL HIGH (ref 70–99)
Glucose-Capillary: 115 mg/dL — ABNORMAL HIGH (ref 70–99)
Glucose-Capillary: 111 mg/dL — ABNORMAL HIGH (ref 70–99)

## 2014-05-13 LAB — CBC
HCT: 38.5 % (ref 36.0–46.0)
Hemoglobin: 12.2 g/dL (ref 12.0–15.0)
MCH: 26.5 pg (ref 26.0–34.0)
MCHC: 31.7 g/dL (ref 30.0–36.0)
MCV: 83.7 fL (ref 78.0–100.0)
Platelets: 262 10*3/uL (ref 150–400)
RBC: 4.6 MIL/uL (ref 3.87–5.11)
RDW: 15.4 % (ref 11.5–15.5)
WBC: 14 10*3/uL — ABNORMAL HIGH (ref 4.0–10.5)

## 2014-05-13 LAB — CARBOXYHEMOGLOBIN
Carboxyhemoglobin: 1.4 % (ref 0.5–1.5)
Methemoglobin: 0.7 % (ref 0.0–1.5)
O2 Saturation: 63.7 %
Total hemoglobin: 12.7 g/dL (ref 12.0–16.0)

## 2014-05-13 LAB — HEPARIN LEVEL (UNFRACTIONATED): Heparin Unfractionated: 0.62 IU/mL (ref 0.30–0.70)

## 2014-05-13 MED ORDER — FUROSEMIDE 10 MG/ML IJ SOLN
80.0000 mg | Freq: Once | INTRAMUSCULAR | Status: AC
  Administered 2014-05-13: 80 mg via INTRAVENOUS

## 2014-05-13 MED ORDER — TORSEMIDE 20 MG PO TABS
40.0000 mg | ORAL_TABLET | Freq: Two times a day (BID) | ORAL | Status: DC
  Administered 2014-05-14 – 2014-05-15 (×4): 40 mg via ORAL
  Filled 2014-05-13 (×6): qty 2

## 2014-05-13 NOTE — Progress Notes (Addendum)
Patient ID: Jacqueline Reilly, female   DOB: [birth year], 80 y.o.   MRN: [id number] Advanced Heart Failure Rounding Note   Subjective:   [name] is an 80 y/o woman with CAD, AAA, systolic HF EF [id number], dementia, and CAD and has had two previous coronary interventions including a cutting balloon to her D2 in 2007 and a DES to her mid LCX in 2010. Her LV dysfunction has been out of proportion to her CAD.    Admitted 05/07/14 with syncope and dyspnea. She had new onset A-fib RVR. Troponin was 1.03>  1.59> 2.18  .   She was placed on amiodarone and heparin drip. She developed respiratory distress and required intubation. She converted to NSR but later was bradycardiac with NSVT. Amiodarone temporarily stopped but restarted after hyperkalemia resolved. Hyperkalemia was thought to be contributing to bradycardia.  Dopamine later added for hypotension. Diuresed with IV lasix.    She was started on Milrinone 7/14 due to CO-OX 29%.  Levo added for low BP, now off. She continues on amiodarone drip for A fib. She has also had episodes of bradycardia.30-40s. She got a dose of metolazone along with IV Lasix yesterday.   She diuresed well finally and weight is down 5 lbs.  CVP 3-4.  She is back in NSR today.   CO-OX 69%> 29%>43%>milrinone begun>63>65%    Objective:   Weight Range:  Vital Signs:   Temp:  [97.5 F (36.4 C)-98.2 F (36.8 C)] 97.9 F (36.6 C) (07/17 0400) Pulse Rate:  [25-88] 69 (07/17 0755) Resp:  [13-30] 26 (07/17 0755) BP: (84-115)/(53-83) 101/59 mmHg (07/17 0755) SpO2:  [92 %-100 %] 99 % (07/17 0755) FiO2 (%):  [40 %] 40 % (07/17 0755) Weight:  [93 lb 7.6 oz (42.4 kg)] 93 lb 7.6 oz (42.4 kg) (07/17 0500) Last BM Date: 05/09/14  Weight change: Filed Weights   05/11/14 0500 05/12/14 0500 05/13/14 0500  Weight: 101 lb 13.6 oz (46.2 kg) 98 lb 1.7 oz (44.5 kg) 93 lb 7.6 oz (42.4 kg)    Intake/Output:   Intake/Output Summary (Last 24 hours) at 05/13/14 0836 Last data filed at  05/13/14 0700  Gross per 24 hour  Intake   2317 ml  Output   4700 ml  Net  -2383 ml     Physical Exam:CVP 9-10 General:  Intubated. Awake  HEENT: normal Neck: supple. JVP not elevated. Carotids 2+ bilat; no bruits. No lymphadenopathy or thryomegaly appreciated. Cor: PMI nondisplaced. Regular rate & rhythm. No rubs, gallops or murmurs. Lungs: Rhonchi throughout Abdomen: soft, nontender, nondistended. No hepatosplenomegaly. No bruits or masses. Good bowel sounds. Extremities: Cool.  No cyanosis, clubbing, rash, edema Neuro: Intubated but following commands on vent.  moves all 4 extremities w/o difficulty.   Telemetry: SR 70s  Labs: Basic Metabolic Panel:  Recent Labs Lab 05/08/14 0839 05/09/14 0430  05/10/14 0545  05/11/14 0500  05/11/14 1900 05/12/14 0340 05/12/14 1645 05/12/14 2110 05/13/14 0420  NA 144 144  < > 142  < > 143  < > 143 146 140 141 142  K 4.7 3.1*  < > 4.4  < > 3.3*  < > 5.5* 4.1 3.7 3.6* 4.3  CL 103 103  < > 100  < > 99  < > 102 102 95* 93* 94*  CO2 24 29  < > 25  < > 29  < > 28 32 33* 36* 35*  GLUCOSE 149* 127*  < > 149*  < > 120*  < >  135* 86 134* 123* 105*  BUN 43* 47*  < > 57*  < > 50*  < > 55* 53* 52* 53* 54*  CREATININE 1.10 0.89  < > 0.91  < > 0.81  < > 0.84 0.85 0.79 0.78 0.88  CALCIUM 9.9 8.5  < > 8.4  < > 8.4  < > 8.6 8.5 8.7 9.1 9.0  MG 2.5 2.3  --  2.5  --  2.3  --   --  2.4  --   --   --   PHOS 5.0* 3.4  --  3.8  --  3.1  --   --  3.3  --   --   --   < > = values in this interval not displayed.  Liver Function Tests:  Recent Labs Lab 05/08/14 0155  AST 61*  ALT 32  ALKPHOS 96  BILITOT 0.3  PROT 6.5  ALBUMIN 3.2*   No results found for this basename: LIPASE, AMYLASE,  in the last 168 hours No results found for this basename: AMMONIA,  in the last 168 hours  CBC:  Recent Labs Lab 05/09/14 0430 05/10/14 0545 05/11/14 0500 05/12/14 0340 05/13/14 0420  WBC 12.2* 17.3* 11.8* 10.2 14.0*  NEUTROABS 9.8*  --   --   --   --    HGB 11.9* 12.9 11.8* 11.6* 12.2  HCT 37.6 40.3 37.1 36.5 38.5  MCV 85.8 85.2 83.9 85.3 83.7  PLT 219 317 255 222 262    Cardiac Enzymes:  Recent Labs Lab 05/07/14 1625 05/07/14 2058 05/08/14 0155  TROPONINI 1.59* 1.54* 2.18*    BNP: BNP (last 3 results)  Recent Labs  04/28/14 1107 05/07/14 0920 05/09/14 0430  PROBNP 15377.0* 15676.0* 23926.0*     Other results:  :   Imaging: Dg Chest Port 1 View  05/12/2014   CLINICAL DATA:  Acute respiratory failure.  Ventilator.  EXAM: PORTABLE CHEST - 1 VIEW  COMPARISON:  05/10/2014  FINDINGS: Endotracheal tube, central line, and NG tube appear in good position. There has been further clearing of the interstitial edema superimposed on chronic lung disease. Cardiomegaly process. Pulmonary vascularity has improved. There is still slight prominence of the right pulmonary arteries.  IMPRESSION: Further improvement in interstitial edema superimposed on chronic lung disease.   Electronically Signed   By: Rozetta Nunnery M.D.   On: 05/12/2014 07:56     Medications:     Scheduled Medications: . antiseptic oral rinse  15 mL Mouth Rinse QID  . aspirin  81 mg Oral Daily  . azithromycin  500 mg Intravenous QHS  . cefTRIAXone (ROCEPHIN)  IV  1 g Intravenous QHS  . chlorhexidine  15 mL Mouth Rinse BID  . furosemide  80 mg Intravenous TID  . insulin aspart  0-9 Units Subcutaneous 6 times per day  . multivitamin with minerals  1 tablet Oral Daily  . pantoprazole (PROTONIX) IV  40 mg Intravenous QHS  . sodium chloride  10-40 mL Intracatheter Q12H  . sodium chloride  3 mL Intravenous Q12H    Infusions: . sodium chloride 20 mL/hr at 05/12/14 0600  . amiodarone 30 mg/hr (05/13/14 0700)  . feeding supplement (VITAL AF 1.2 CAL) 1,000 mL (05/13/14 0700)  . heparin 1,000 Units/hr (05/13/14 0700)  . milrinone 0.25 mcg/kg/min (05/13/14 0700)  . norepinephrine (LEVOPHED) Adult infusion Stopped (05/11/14 1900)    PRN Medications: sodium  chloride, fentaNYL, fentaNYL, ondansetron (ZOFRAN) IV, sodium chloride, sodium chloride   Assessment:  1. A  fib RVR 2. Syncope 3. NSTEMI 4. Acute Respiratory Failure- Intubated.  Suspect primarily pulmonary edema.  5. Acute/Chronic Systolic Heart Failure: EF 25-30%, LV dysfunction has been out of proportion to CAD.  6. Hyperkalemia  7. Early Dementia 8. Hypokalemia 9. CAD 10. ?PNA: on antibiotics.  11. Limited Code- No cpr . No defibrillation.    Plan/Discussion:    CCM following. Recommendations appreciated.   Back in NSR.  Continue amiodarone drip and Heparin.  If doing well and maintaining NSR, would consider transition to amiodarone 400 mg bid tomorrow.    Respiratory failure with pulmonary edema in setting of atrial fibrillation with RVR and underlying systolic CHF.  Weight down 5 pounds with CVP 3-4. Co-ox 64% on milrinone.  If she does well with SBT, will be extubated today.  From a cardiac standpoint, she is optimized for this.   - Would give 1 more dose po Lasix peri-extubation, then start her home torsemide regimen tomorrow.   - Continue milrinone today, can start slow wean tomorrow (to 0.125 mcg/kg/min) if she is doing well. I am concerned that it may be hard to get her off milrinone given her very low co-ox but hopefully possible if we keep her in NSR. Can try to start low dose hydralazine/nitrates as milrinone is weaned.   Length of Stay: [place] 05/13/2014 8:36 AM

## 2014-05-13 NOTE — Procedures (Signed)
Extubation Procedure Note  Patient Details:   Name: Jacqueline Reilly DOB: Dec 05, 1923 MRN: [id number]   Airway Documentation:     Evaluation  O2 sats: stable throughout Complications: No apparent complications Patient did tolerate procedure well. Bilateral Breath Sounds: Rhonchi;Diminished Suctioning: Airway Yes Patient tolerated wean. MD ordered to extubate. Positive for cuff leak. Patient extubated to a 4 Lpm nasal cannula. No signs of dyspnea or stridor noted. Patient instructed on the Incentive Spirometer, achieving 548mL 5 times, with good effort. RN at bedside. Patient resting comfortably.  Jacqueline Reilly 05/13/2014, 9:45 AM

## 2014-05-13 NOTE — Progress Notes (Signed)
PULMONARY / CRITICAL CARE MEDICINE  Name: Jacqueline Reilly MRN: [id number] DOB: [birth year]    ADMISSION DATE:  05/07/2014 CONSULTATION DATE:  05/07/14  REFERRING MD :  Cardiology PRIMARY SERVICE: Cardiology  CHIEF COMPLAINT:  Respiratory failure  BRIEF PATIENT DESCRIPTION: 80 yo with CAD and CHF ( EF 25% ) admitted 7/11 with syncope, AF/RVR, pulmonary edema and acute respiratory failure.  Failed NIMV.  Intubated.  EVENTS / STUDIES: 7/12  Venous Doppler >>> NO DVT 7/12  TTE >>> EF 20%, no RWMA, PAP 38 torr  LINES / TUBES: OETT 7/11 >>> OGT 7/11 >>> R IJ TLC 7/11 >>> Foley 7/11 >>>  CULTURES: 7/12  Blood >>> 7/12  Respiratory >>> npof 7/11  MRSA PCR  >>> neg 7/12  RVP >>> neg  ANTIBIOTICS: Ceftraixone 7/12 >>> Azithromycin 7/12 >>>  INTERVAL HISTORY:  Diuresed well, with decrease in CVP.  COOX reassuring.   VITAL SIGNS: Temp:  [97.5 F (36.4 C)-98.2 F (36.8 C)] 97.9 F (36.6 C) (07/17 0400) Pulse Rate:  [25-88] 69 (07/17 0700) Resp:  [13-30] 21 (07/17 0700) BP: (84-115)/(53-83) 94/53 mmHg (07/17 0700) SpO2:  [92 %-100 %] 96 % (07/17 0700) FiO2 (%):  [40 %] 40 % (07/17 0700) Weight:  [42.4 kg (93 lb 7.6 oz)] 42.4 kg (93 lb 7.6 oz) (07/17 0500)  HEMODYNAMICS: CVP:  [3 mmHg-10 mmHg] 3 mmHg  VENTILATOR SETTINGS: Vent Mode:  [-] PRVC FiO2 (%):  [40 %] 40 % Set Rate:  [18 bmp] 18 bmp Vt Set:  [360 mL] 360 mL PEEP:  [5 cmH20] 5 cmH20 Pressure Support:  [5 cmH20] 5 cmH20 Plateau Pressure:  [8 cmH20-21 cmH20] 21 cmH20  INTAKE / OUTPUT: Intake/Output     07/16 0701 - 07/17 0700 07/17 0701 - 07/18 0700   I.V. (mL/kg) 1171.1 (27.6)    NG/GT 930    IV Piggyback 300    Total Intake(mL/kg) 2401.1 (56.6)    Urine (mL/kg/hr) 4850 (4.8)    Total Output 4850     Net -2448.9           PHYSICAL EXAMINATION: General:  No distress Neuro:  Awake, alert, following commands HEENT:  PERRL, OETT / OGT Cardiovascular:  Regular, no tachycardia  Lungs:  Rales / rhonchi  bilaterally Abdomen:  Soft, nontender, bowel sounds diminished Musculoskeletal:  Moves all extremities, no edema Skin:  Intact  LABS: CBC  Recent Labs Lab 05/11/14 0500 05/12/14 0340 05/13/14 0420  WBC 11.8* 10.2 14.0*  HGB 11.8* 11.6* 12.2  HCT 37.1 36.5 38.5  PLT 255 222 262   Coag's  Recent Labs Lab 05/07/14 1749  APTT 42*  INR 1.63*   BMET  Recent Labs Lab 05/12/14 1645 05/12/14 2110 05/13/14 0420  NA 140 141 142  K 3.7 3.6* 4.3  CL 95* 93* 94*  CO2 33* 36* 35*  BUN 52* 53* 54*  CREATININE 0.79 0.78 0.88  GLUCOSE 134* 123* 105*   Electrolytes  Recent Labs Lab 05/10/14 0545  05/11/14 0500  05/12/14 0340 05/12/14 1645 05/12/14 2110 05/13/14 0420  CALCIUM 8.4  < > 8.4  < > 8.5 8.7 9.1 9.0  MG 2.5  --  2.3  --  2.4  --   --   --   PHOS 3.8  --  3.1  --  3.3  --   --   --   < > = values in this interval not displayed. Sepsis Markers  Recent Labs Lab 05/08/14 0135 05/08/14 0145 05/09/14 0430 05/09/14 [id number]  05/10/14 0545  LATICACIDVEN 5.7*  --   --  1.0  --   PROCALCITON  --  0.42 0.41  --  0.33   ABG  Recent Labs Lab 05/07/14 2204 05/08/14 0100 05/08/14 0507  PHART 7.269* 7.235* 7.304*  PCO2ART 48.3* 49.5* 35.9  PO2ART 168.0* 312.0* 122.0*   Liver Enzymes  Recent Labs Lab 05/08/14 0155  AST 61*  ALT 32  ALKPHOS 96  BILITOT 0.3  ALBUMIN 3.2*   Cardiac Enzymes  Recent Labs Lab 05/07/14 0920 05/07/14 1625 05/07/14 2058 05/08/14 0155 05/09/14 0430  TROPONINI  --  1.59* 1.54* 2.18*  --   PROBNP 15676.0*  --   --   --  23926.0*   Glucose  Recent Labs Lab 05/12/14 0750 05/12/14 1155 05/12/14 1652 05/12/14 1940 05/12/14 2341 05/13/14 0403  GLUCAP 150* 184* 122* 119* 151* 104*   IMAGING:  Dg Chest Port 1 View  05/12/2014   CLINICAL DATA:  Acute respiratory failure.  Ventilator.  EXAM: PORTABLE CHEST - 1 VIEW  COMPARISON:  05/10/2014  FINDINGS: Endotracheal tube, central line, and NG tube appear in good position.  There has been further clearing of the interstitial edema superimposed on chronic lung disease. Cardiomegaly process. Pulmonary vascularity has improved. There is still slight prominence of the right pulmonary arteries.  IMPRESSION: Further improvement in interstitial edema superimposed on chronic lung disease.   Electronically Signed   By: Rozetta Nunnery M.D.   On: 05/12/2014 07:56   ASSESSMENT / PLAN:  PULMONARY A:  Acute respiratory failure Pulmonary edema Possible pneumonia P:   Goal pH>7.30, SpO2>92 Continuous mechanical support VAP bundle SBT with intent to extubate today Trend ABG/CXR  CARDIOVASCULAR A:  Acute on chronic systolic heart failure Cardiogenic shock AF/ RVR, now in SR VT runs P:  Goal MAP > 65 Cardiology following ASA BB / ACEI contraindicated - shock Milrinone gtt Amiodarone gtt Trend COOX  RENAL A:   Finally negative fluid balance P:   Trend BMP q12h Lasix 80 mg IV q8h Repeat Metolazone?  GASTROINTESTINAL A:   Nutrition GI Px P:   TF per Nutritionist, hold for possible extubation Protonix  HEMATOLOGIC A: Mild anemia No overt hemorrhage Heparinization for ACS / AF P:  Trend CBC Heparin gtt  INFECTIOUS A: Possible CAP P:   Abx / cx as above  ENDOCRINE A:   Adrenal insufficiency ruled out ( cortisol 113 ) P:   SSI  NEUROLOGIC A:   Acute encephalopathy P:   Fentanyl PRN  I have personally obtained history, examined patient, evaluated and interpreted laboratory and imaging results, reviewed medical records, formulated assessment / plan and placed orders.  CRITICAL CARE:  The patient is critically ill with multiple organ systems failure and requires high complexity decision making for assessment and support, frequent evaluation and titration of therapies, application of advanced monitoring technologies and extensive interpretation of multiple databases. Critical Care Time devoted to patient care services described in this note  is 35 minutes.   Doree Fudge, MD Pulmonary [facility] [place] Pager: [phone]  05/13/2014, 7:55 AM

## 2014-05-13 NOTE — Progress Notes (Signed)
ANTICOAGULATION CONSULT NOTE - Follow Up Consult  Pharmacy Consult for Heparin Indication: chest pain/ACS  Allergies  Allergen Reactions  . Codeine Nausea Only  . Garlic Nausea Only    Labs:  Recent Labs  05/11/14 0500  05/12/14 0340 05/12/14 1250 05/12/14 1645 05/12/14 2110 05/13/14 0420  HGB 11.8*  --  11.6*  --   --   --  12.2  HCT 37.1  --  36.5  --   --   --  38.5  PLT 255  --  222  --   --   --  262  HEPARINUNFRC 0.28*  < > 0.25* 0.33  --   --  0.62  CREATININE 0.81  < > 0.85  --  0.79 0.78 0.88  < > = values in this interval not displayed. Estimated Creatinine Clearance: 29.6 ml/min (by C-G formula based on Cr of 0.88).  Assessment: 80yo female presents with near syncopal episode according to son.  She was found to be in Afib with RVR in the ED. Her troponins were elevated as well as a significant elevation in her BNP.    Continues in afib (amio gtt continued), off pressors but continues on milrinone.  HL now at goal 0.6 after rate increased yesterday afternoon. CBC trending up now, no bleeding complications noted.  Cultures are no growth to date currently on azith/ceftriaxone  Extubated this am - will follow along plan for long term AC  Goal of Therapy:  Heparin level 0.3-0.7 units/ml Monitor platelets by anticoagulation protocol: Yes   Plan:  Decrease heparin to 950 units / hr - recheck level in am Daily heparin level, CBC  Erin Hearing PharmD., BCPS Clinical Pharmacist Pager [phone] 05/13/2014 11:36 AM

## 2014-05-14 ENCOUNTER — Inpatient Hospital Stay (HOSPITAL_COMMUNITY): Payer: Medicare Other

## 2014-05-14 LAB — CULTURE, BLOOD (ROUTINE X 2)
Culture: NO GROWTH
Culture: NO GROWTH

## 2014-05-14 LAB — HEPARIN LEVEL (UNFRACTIONATED)
Heparin Unfractionated: 0.61 IU/mL (ref 0.30–0.70)
Heparin Unfractionated: 0.78 IU/mL — ABNORMAL HIGH (ref 0.30–0.70)

## 2014-05-14 LAB — CBC
HCT: 34.2 % — ABNORMAL LOW (ref 36.0–46.0)
Hemoglobin: 11 g/dL — ABNORMAL LOW (ref 12.0–15.0)
MCH: 26.7 pg (ref 26.0–34.0)
MCHC: 32.2 g/dL (ref 30.0–36.0)
MCV: 83 fL (ref 78.0–100.0)
Platelets: 232 10*3/uL (ref 150–400)
RBC: 4.12 MIL/uL (ref 3.87–5.11)
RDW: 15.2 % (ref 11.5–15.5)
WBC: 9.3 10*3/uL (ref 4.0–10.5)

## 2014-05-14 LAB — BASIC METABOLIC PANEL
Anion gap: 11 (ref 5–15)
BUN: 47 mg/dL — ABNORMAL HIGH (ref 6–23)
CO2: 38 mEq/L — ABNORMAL HIGH (ref 19–32)
Calcium: 8.8 mg/dL (ref 8.4–10.5)
Chloride: 89 mEq/L — ABNORMAL LOW (ref 96–112)
Creatinine, Ser: 0.83 mg/dL (ref 0.50–1.10)
GFR calc Af Amer: 71 mL/min — ABNORMAL LOW (ref >90)
GFR calc non Af Amer: 61 mL/min — ABNORMAL LOW (ref >90)
Glucose, Bld: 94 mg/dL (ref 70–99)
Potassium: 3.5 mEq/L — ABNORMAL LOW (ref 3.7–5.3)
Sodium: 138 mEq/L (ref 137–147)

## 2014-05-14 LAB — GLUCOSE, CAPILLARY
Glucose-Capillary: 101 mg/dL — ABNORMAL HIGH (ref 70–99)
Glucose-Capillary: 104 mg/dL — ABNORMAL HIGH (ref 70–99)
Glucose-Capillary: 107 mg/dL — ABNORMAL HIGH (ref 70–99)
Glucose-Capillary: 117 mg/dL — ABNORMAL HIGH (ref 70–99)
Glucose-Capillary: 75 mg/dL (ref 70–99)

## 2014-05-14 LAB — CARBOXYHEMOGLOBIN
Carboxyhemoglobin: 1.6 % — ABNORMAL HIGH (ref 0.5–1.5)
Methemoglobin: 1 % (ref 0.0–1.5)
O2 Saturation: 70.4 %
Total hemoglobin: 11.4 g/dL — ABNORMAL LOW (ref 12.0–16.0)

## 2014-05-14 MED ORDER — INSULIN ASPART 100 UNIT/ML ~~LOC~~ SOLN: 0.0000 [IU] | Freq: Every day | SUBCUTANEOUS | Status: DC

## 2014-05-14 MED ORDER — INSULIN ASPART 100 UNIT/ML ~~LOC~~ SOLN
0.0000 [IU] | Freq: Three times a day (TID) | SUBCUTANEOUS | Status: DC
  Administered 2014-05-14 – 2014-05-15 (×2): 2 [IU] via SUBCUTANEOUS
  Administered 2014-05-18: 3 [IU] via SUBCUTANEOUS
  Administered 2014-05-19: 2 [IU] via SUBCUTANEOUS
  Administered 2014-05-20: 3 [IU] via SUBCUTANEOUS

## 2014-05-14 MED ORDER — POTASSIUM CHLORIDE CRYS ER 20 MEQ PO TBCR
20.0000 meq | EXTENDED_RELEASE_TABLET | ORAL | Status: AC
  Administered 2014-05-14 (×2): 20 meq via ORAL
  Filled 2014-05-14 (×2): qty 1

## 2014-05-14 MED ORDER — HEPARIN (PORCINE) IN NACL 100-0.45 UNIT/ML-% IJ SOLN
800.0000 [IU]/h | INTRAMUSCULAR | Status: DC
  Administered 2014-05-15: 800 [IU]/h via INTRAVENOUS
  Filled 2014-05-14: qty 250

## 2014-05-14 NOTE — Progress Notes (Signed)
[facility] ADULT ICU REPLACEMENT PROTOCOL FOR AM LAB REPLACEMENT ONLY  The patient does apply for the [facility] Adult ICU Electrolyte Replacment Protocol based on the criteria listed below:   1. Is GFR >/= 40 ml/min? Yes.    Patient's GFR today is 61 2. Is urine output >/= 0.5 ml/kg/hr for the last 6 hours? Yes.   Patient's UOP is 1.3 ml/kg/hr 3. Is BUN < 60 mg/dL? Yes.    Patient's BUN today is 47 4. Abnormal electrolyte(s): K+3.5 5. Ordered repletion with: protocol 6. If a panic level lab has been reported, has the CCM MD in charge been notified? Yes.  .   Physician:  E Deterding  [facility] 05/14/2014 5:18 AM

## 2014-05-14 NOTE — Progress Notes (Signed)
SUBJECTIVE:  Feeling better  OBJECTIVE:   Vitals:   Filed Vitals:   05/14/14 0800 05/14/14 0816 05/14/14 0900 05/14/14 1000  BP: 105/65  94/75 96/71  Pulse: 84  91 98  Temp:  98.3 F (36.8 C)    TempSrc:  Oral    Resp: 20  27 18   Height:      Weight:      SpO2: 99%  97% 98%   I&O's:   Intake/Output Summary (Last 24 hours) at 05/14/14 1046 Last data filed at 05/14/14 1000  Gross per 24 hour  Intake 1521.31 ml  Output   2415 ml  Net -893.69 ml   TELEMETRY: Reviewed telemetry pt in ? Atrial fibrillation:     PHYSICAL EXAM General: Well developed, well nourished, in no acute distress Head: Eyes PERRLA, No xanthomas.   Normal cephalic and atramatic  Lungs:   Scattered rhonchi Heart:   HRRR S1 S2 Pulses are 2+ & equal. Abdomen: Bowel sounds are positive, abdomen soft and non-tender without masses Extremities:   No clubbing, cyanosis or edema.  DP +1 Neuro: Alert and oriented X 3. Psych:  Good affect, responds appropriately   LABS: Basic Metabolic Panel:  Recent Labs  05/12/14 0340  05/13/14 0420 05/14/14 0410  NA 146  < > 142 138  K 4.1  < > 4.3 3.5*  CL 102  < > 94* 89*  CO2 32  < > 35* 38*  GLUCOSE 86  < > 105* 94  BUN 53*  < > 54* 47*  CREATININE 0.85  < > 0.88 0.83  CALCIUM 8.5  < > 9.0 8.8  MG 2.4  --   --   --   PHOS 3.3  --   --   --   < > = values in this interval not displayed. Liver Function Tests: No results found for this basename: AST, ALT, ALKPHOS, BILITOT, PROT, ALBUMIN,  in the last 72 hours No results found for this basename: LIPASE, AMYLASE,  in the last 72 hours CBC:  Recent Labs  05/13/14 0420 05/14/14 0400  WBC 14.0* 9.3  HGB 12.2 11.0*  HCT 38.5 34.2*  MCV 83.7 83.0  PLT 262 232   Cardiac Enzymes: No results found for this basename: CKTOTAL, CKMB, CKMBINDEX, TROPONINI,  in the last 72 hours BNP: No components found with this basename: POCBNP,  D-Dimer: No results found for this basename: DDIMER,  in the last 72  hours Hemoglobin A1C: No results found for this basename: HGBA1C,  in the last 72 hours Fasting Lipid Panel: No results found for this basename: CHOL, HDL, LDLCALC, TRIG, CHOLHDL, LDLDIRECT,  in the last 72 hours Thyroid Function Tests: No results found for this basename: TSH, T4TOTAL, FREET3, T3FREE, THYROIDAB,  in the last 72 hours Anemia Panel: No results found for this basename: VITAMINB12, FOLATE, FERRITIN, TIBC, IRON, RETICCTPCT,  in the last 72 hours Coag Panel:   Lab Results  Component Value Date   INR 1.63* 05/07/2014   INR 1.14 04/24/2014   INR 0.90 07/22/2012    RADIOLOGY: Dg Chest 2 View  04/23/2014   CLINICAL DATA:  Shortness of breath for 1 week  EXAM: CHEST  2 VIEW  COMPARISON:  04/20/2009  FINDINGS: There is cardiomegaly which is likely progressed from previous. Chronic aortic tortuosity. Aortic annular calcification noted in the lateral projection. New diffuse interstitial abnormality with Kerley B-lines. There is cephalized pulmonary blood flow. Some of the opacities appears somewhat coarse, especially at the bases.  No evidence of effusion or pneumothorax.  Bilateral glenohumeral osteoarthritis with loose bodies.  IMPRESSION: 1. CHF. 2. Question background chronic lung disease/fibrosis, which would be new from 2010 comparison.   Electronically Signed   By: Jorje Guild M.D.   On: 04/23/2014 03:10   Dg Chest Port 1 View  05/12/2014   CLINICAL DATA:  Acute respiratory failure.  Ventilator.  EXAM: PORTABLE CHEST - 1 VIEW  COMPARISON:  05/10/2014  FINDINGS: Endotracheal tube, central line, and NG tube appear in good position. There has been further clearing of the interstitial edema superimposed on chronic lung disease. Cardiomegaly process. Pulmonary vascularity has improved. There is still slight prominence of the right pulmonary arteries.  IMPRESSION: Further improvement in interstitial edema superimposed on chronic lung disease.   Electronically Signed   By: Rozetta Nunnery M.D.    On: 05/12/2014 07:56   Dg Chest Port 1 View  05/10/2014   CLINICAL DATA:  Follow-up pulmonary edema.  EXAM: PORTABLE CHEST - 1 VIEW  COMPARISON:  05/09/2014.  FINDINGS: Endotracheal tube tip 3.6 cm above the carina.  Nasogastric tube courses below the diaphragm. Tip is not included on the present exam.  Right central line tip distal superior vena cava/ cavoatrial junction level.  Cardiomegaly.  Calcified tortuous aorta.  Pulmonary edema similar to the prior exam.  No segmental consolidation. Evaluation left lung base limited by cardiomegaly.  Shoulder joint degenerative changes. Possible bone infarct proximal left humerus.  IMPRESSION: No significant change in degree of pulmonary edema. Please see above.   Electronically Signed   By: Chauncey Cruel M.D.   On: 05/10/2014 08:17   Dg Chest Port 1 View  05/09/2014   CLINICAL DATA:  Respiratory failure.  EXAM: PORTABLE CHEST - 1 VIEW  COMPARISON:  05/08/2014  FINDINGS: Endotracheal tube tip is approximately 2.5 cm above the carina. There has been interval placement of a gastric decompression tube with the tip extending into the stomach. Lungs showed interval worsening of pulmonary edema. There is stable cardiomegaly. No significant pleural fluid identified. No pneumothorax.  IMPRESSION: Worsening of pulmonary edema.   Electronically Signed   By: Aletta Edouard M.D.   On: 05/09/2014 07:39   Dg Chest Port 1 View  05/08/2014   CLINICAL DATA:  Endotracheal tube and central line placement.  EXAM: PORTABLE CHEST - 1 VIEW  COMPARISON:  05/07/2014  FINDINGS: Endotracheal tube placed with tip measuring 3.3 cm above the carina. Right central venous catheter placed with tip over the cavoatrial junction. No pneumothorax. Cardiac enlargement with pulmonary vascular congestion and diffuse perihilar opacities consistent with edema. Edema pattern is progressing since previous study. No blunting of costophrenic angles. No pneumothorax. Degenerative changes in the shoulders.   IMPRESSION: Appliances appear to be in satisfactory location. Cardiac enlargement with pulmonary vascular congestion and progressive edema since previous study.   Electronically Signed   By: Lucienne Capers M.D.   On: 05/08/2014 01:34   Dg Chest Port 1 View  05/07/2014   CLINICAL DATA:  Syncopal episode  EXAM: PORTABLE CHEST - 1 VIEW  COMPARISON:  04/23/2014  FINDINGS: Cardiac shadow remains enlarged. Diffuse fibrotic changes are again identified. No sizable effusion or focal infiltrate is noted. Chronic changes about shoulder joints are seen.  IMPRESSION: Chronic changes without acute abnormality.   Electronically Signed   By: Inez Catalina M.D.   On: 05/07/2014 10:04   Dg Abd Portable 1v  05/08/2014   CLINICAL DATA:  OG tube placement  EXAM: PORTABLE ABDOMEN - 1  VIEW  COMPARISON:  None.  FINDINGS: Enteric tube tip is localized to the left lower abdomen consistent with location in the distal stomach. Visualized bowel gas pattern is unremarkable. Cardiac enlargement with probable interstitial infiltration in the lungs.  IMPRESSION: Enteric tube tip is in the left abdomen consistent with location in the distal stomach.   Electronically Signed   By: Lucienne Capers M.D.   On: 05/08/2014 05:38    Assessment:   1. A fib RVR - had converted to NSR but appears to be back in afib with CVR 2. Syncope  3. NSTEMI  4. Acute Respiratory Failure- Intubated. Suspect primarily pulmonary edema.  5. Acute/Chronic Systolic Heart Failure: EF 25-30%, LV dysfunction has been out of proportion to CAD.  6. Hyperkalemia  7. Early Dementia  8. Hypokalemia  9. CAD  10. ?PNA: on antibiotics.  11. Limited Code- No cpr . No defibrillation.  Plan/Discussion:   CCM following. Recommendations appreciated.  Was back in NSR but now appears somewhat irregular. Continue amiodarone drip and Heparin. Check EKG today - If  NSR, would consider transition to amiodarone 400 mg bid.  Respiratory failure with pulmonary edema in  setting of atrial fibrillation with RVR and underlying systolic CHF. Weight down 6 pounds with CVP 3-4. Co-ox 70% on milrinone. Extubated and doing well.  She has a lot of upper airway rhonchi and secretions. - Restart her home torsemide regimen today.  - Start slow wean of Milrinone today (to 0.125 mcg/kg/min).  Can try to start low dose hydralazine/nitrates as milrinone is weaned but BP too low today to add. - replete potassium - continue antibiotics - check chest xray  Sueanne Margarita, MD  05/14/2014  10:46 AM

## 2014-05-14 NOTE — Progress Notes (Addendum)
ANTICOAGULATION CONSULT NOTE - Follow Up Consult  Pharmacy Consult for Heparin Indication: chest pain/ACS  Allergies  Allergen Reactions  . Codeine Nausea Only  . Garlic Nausea Only    Labs:  Recent Labs  05/12/14 0340 05/12/14 1250  05/12/14 2110 05/13/14 0420 05/14/14 0400 05/14/14 0410  HGB 11.6*  --   --   --  12.2 11.0*  --   HCT 36.5  --   --   --  38.5 34.2*  --   PLT 222  --   --   --  262 232  --   HEPARINUNFRC 0.25* 0.33  --   --  0.62 0.61  --   CREATININE 0.85  --   < > 0.78 0.88  --  0.83  < > = values in this interval not displayed. Estimated Creatinine Clearance: 30.9 ml/min (by C-G formula based on Cr of 0.83).  Assessment: 80yo female presents with near syncopal episode according to son.  She was found to be in Afib with RVR in the ED. Her troponins were elevated as well as a significant elevation in her BNP.    Continues in afib (amio gtt continued), off pressors but continues on milrinone.  HL now at goal 0.6 after rate decrease yesterday. CBC relatively stable, no bleeding complications noted.  Cultures are no growth to date currently on azith/ceftriaxone   Goal of Therapy:  Heparin level 0.3-0.7 units/ml Monitor platelets by anticoagulation protocol: Yes   Plan:  Continue heparin at 950 units / hr - recheck level in am Daily heparin level, CBC  Albertina Parr, PharmD.  Clinical Pharmacist Pager [phone]    ========================================   Addendum: - HL 0.78, no bleeding reported   Plan: - Decrease heparin gtt to 850 units/hr - F/U AM labs    Fajr Fife D. Mina Marble, PharmD, BCPS Pager:  [phone] 05/14/2014, 5:37 PM

## 2014-05-14 NOTE — Progress Notes (Signed)
PULMONARY / CRITICAL CARE MEDICINE  Name: Jacqueline Reilly MRN: [id number] DOB: Mar 15, 1924    ADMISSION DATE:  05/07/2014 CONSULTATION DATE:  05/07/14  REFERRING MD :  Cardiology PRIMARY SERVICE: Cardiology  CHIEF COMPLAINT:  Respiratory failure  BRIEF PATIENT DESCRIPTION: 80 yo with CAD and CHF ( EF 25% ) admitted 7/11 with syncope, AF/RVR, pulmonary edema and acute respiratory failure.  Failed NIMV.  Intubated.  EVENTS / STUDIES: 7/12  Venous Doppler >>> NO DVT 7/12  TTE >>> EF 20%, no RWMA, PAP 38 torr 7/17  Extubated  LINES / TUBES: OETT 7/11 >>> OGT 7/11 >>> R IJ TLC 7/11 >>> Foley 7/11 >>>  CULTURES: 7/12  Blood >>> 7/12  Respiratory >>> npof 7/11  MRSA PCR  >>> neg 7/12  RVP >>> neg  ANTIBIOTICS: Ceftraixone 7/12 >>> Azithromycin 7/12 >>>  INTERVAL HISTORY:  Remains extubated.  No dyspnea this morning  VITAL SIGNS: Temp:  [96.8 F (36 C)-97.5 F (36.4 C)] 97.2 F (36.2 C) (07/18 0400) Pulse Rate:  [39-78] 58 (07/18 0600) Resp:  [11-32] 29 (07/18 0600) BP: (87-131)/(50-95) 93/52 mmHg (07/18 0600) SpO2:  [95 %-100 %] 98 % (07/18 0600) FiO2 (%):  [40 %] 40 % (07/17 0755) Weight:  [41.8 kg (92 lb 2.4 oz)] 41.8 kg (92 lb 2.4 oz) (07/18 0500)  HEMODYNAMICS:    VENTILATOR SETTINGS: Vent Mode:  [-] PSV;CPAP FiO2 (%):  [40 %] 40 % PEEP:  [5 cmH20] 5 cmH20 Pressure Support:  [5 cmH20] 5 cmH20  INTAKE / OUTPUT: Intake/Output     07/17 0701 - 07/18 0700 07/18 0701 - 07/19 0700   I.V. (mL/kg) 1172.7 (28.1)    NG/GT 0    IV Piggyback 300    Total Intake(mL/kg) 1472.7 (35.2)    Urine (mL/kg/hr) 2730 (2.7)    Total Output 2730     Net -1257.3           PHYSICAL EXAMINATION: General:  Resting comfortable Neuro:  Awake, alert HEENT: No JVD Cardiovascular:  Regular, no tachycardia  Lungs:  Bilateral diminished air entry Abdomen:  Soft, nontender, bowel sounds diminished Musculoskeletal:  Moves all extremities, no edema Skin:   Intact  LABS: CBC  Recent Labs Lab 05/12/14 0340 05/13/14 0420 05/14/14 0400  WBC 10.2 14.0* 9.3  HGB 11.6* 12.2 11.0*  HCT 36.5 38.5 34.2*  PLT 222 262 232   Coag's  Recent Labs Lab 05/07/14 1749  APTT 42*  INR 1.63*   BMET  Recent Labs Lab 05/12/14 2110 05/13/14 0420 05/14/14 0410  NA 141 142 138  K 3.6* 4.3 3.5*  CL 93* 94* 89*  CO2 36* 35* 38*  BUN 53* 54* 47*  CREATININE 0.78 0.88 0.83  GLUCOSE 123* 105* 94   Electrolytes  Recent Labs Lab 05/10/14 0545  05/11/14 0500  05/12/14 0340  05/12/14 2110 05/13/14 0420 05/14/14 0410  CALCIUM 8.4  < > 8.4  < > 8.5  < > 9.1 9.0 8.8  MG 2.5  --  2.3  --  2.4  --   --   --   --   PHOS 3.8  --  3.1  --  3.3  --   --   --   --   < > = values in this interval not displayed. Sepsis Markers  Recent Labs Lab 05/08/14 0135 05/08/14 0145 05/09/14 0430 05/09/14 0858 05/10/14 0545  LATICACIDVEN 5.7*  --   --  1.0  --   PROCALCITON  --  0.42 0.41  --  0.33   ABG  Recent Labs Lab 05/07/14 2204 05/08/14 0100 05/08/14 0507  PHART 7.269* 7.235* 7.304*  PCO2ART 48.3* 49.5* 35.9  PO2ART 168.0* 312.0* 122.0*   Liver Enzymes  Recent Labs Lab 05/08/14 0155  AST 61*  ALT 32  ALKPHOS 96  BILITOT 0.3  ALBUMIN 3.2*   Cardiac Enzymes  Recent Labs Lab 05/07/14 0920 05/07/14 1625 05/07/14 2058 05/08/14 0155 05/09/14 0430  TROPONINI  --  1.59* 1.54* 2.18*  --   PROBNP 15676.0*  --   --   --  23926.0*   Glucose  Recent Labs Lab 05/13/14 0802 05/13/14 1219 05/13/14 1637 05/13/14 2006 05/14/14 0038 05/14/14 0424  GLUCAP 107* 106* 115* 111* 117* 101*   IMAGING:  No results found. ASSESSMENT / PLAN:  PULMONARY A:  Acute respiratory failure, extubated 7/17 Pulmonary edema, improved Possible pneumonia P:   Goal SpO2>92 Supplemental oxygen Incentive spirometry / flutter valve CXR PRN  CARDIOVASCULAR A:  Acute on chronic systolic heart failure Cardiogenic shock AF/ RVR, now in  SR VT runs P:  Goal MAP > 65 Cardiology following ASA BB / ACEI contraindicated - shock Milrinone gtt Amiodarone gtt  RENAL A:   Improved fluid balance Hypokalemia P:   Trend BMP Demadex 40 bid K 20 x 2  GASTROINTESTINAL A:   Nutrition GI Px is not indicated P:   Cardiac diet D/c Protonix D/c TF  HEMATOLOGIC A: Mild anemia No overt hemorrhage Heparinization for ACS / AF P:  Trend CBC Heparin gtt  INFECTIOUS A: Possible CAP P:   D/c abx after 7 days total  ENDOCRINE A:   Adrenal insufficiency ruled out ( cortisol 113 ) P:   SSI, change to ac&hs  NEUROLOGIC A:   Acute encephalopathy, resolved P:   D/c Fentanyl  PCCM will sign off.  Please re consult if necessary.  I have personally obtained history, examined patient, evaluated and interpreted laboratory and imaging results, reviewed medical records, formulated assessment / plan and placed orders.  Doree Fudge, MD Pulmonary and [place] Pager: [phone]  05/14/2014, 7:07 AM

## 2014-05-14 NOTE — Progress Notes (Signed)
Dr. Elsworth Soho notified of patient's heart rate and hemoptysis. Order was given to obtain a STAT heparin level. No orders given for patient's heart rate at this time- ranging from 90's-120's with activity.

## 2014-05-15 LAB — CBC
HCT: 31.6 % — ABNORMAL LOW (ref 36.0–46.0)
HCT: 34.1 % — ABNORMAL LOW (ref 36.0–46.0)
Hemoglobin: 10 g/dL — ABNORMAL LOW (ref 12.0–15.0)
Hemoglobin: 10.8 g/dL — ABNORMAL LOW (ref 12.0–15.0)
MCH: 26.5 pg (ref 26.0–34.0)
MCH: 26.5 pg (ref 26.0–34.0)
MCHC: 31.6 g/dL (ref 30.0–36.0)
MCHC: 31.7 g/dL (ref 30.0–36.0)
MCV: 83.8 fL (ref 78.0–100.0)
MCV: 83.8 fL (ref 78.0–100.0)
Platelets: 246 10*3/uL (ref 150–400)
Platelets: 286 10*3/uL (ref 150–400)
RBC: 3.77 MIL/uL — ABNORMAL LOW (ref 3.87–5.11)
RBC: 4.07 MIL/uL (ref 3.87–5.11)
RDW: 15.4 % (ref 11.5–15.5)
RDW: 15.4 % (ref 11.5–15.5)
WBC: 9.7 10*3/uL (ref 4.0–10.5)
WBC: 11.2 10*3/uL — ABNORMAL HIGH (ref 4.0–10.5)

## 2014-05-15 LAB — BASIC METABOLIC PANEL
Anion gap: 13 (ref 5–15)
BUN: 39 mg/dL — ABNORMAL HIGH (ref 6–23)
CO2: 36 mEq/L — ABNORMAL HIGH (ref 19–32)
Calcium: 8.9 mg/dL (ref 8.4–10.5)
Chloride: 88 mEq/L — ABNORMAL LOW (ref 96–112)
Creatinine, Ser: 0.85 mg/dL (ref 0.50–1.10)
GFR calc Af Amer: 69 mL/min — ABNORMAL LOW (ref >90)
GFR calc non Af Amer: 59 mL/min — ABNORMAL LOW (ref >90)
Glucose, Bld: 157 mg/dL — ABNORMAL HIGH (ref 70–99)
Potassium: 3.6 mEq/L — ABNORMAL LOW (ref 3.7–5.3)
Sodium: 137 mEq/L (ref 137–147)

## 2014-05-15 LAB — CARBOXYHEMOGLOBIN
Carboxyhemoglobin: 1.7 % — ABNORMAL HIGH (ref 0.5–1.5)
Methemoglobin: 0.7 % (ref 0.0–1.5)
O2 Saturation: 66.6 %
Total hemoglobin: 10.1 g/dL — ABNORMAL LOW (ref 12.0–16.0)

## 2014-05-15 LAB — PRO B NATRIURETIC PEPTIDE: Pro B Natriuretic peptide (BNP): 7409 pg/mL — ABNORMAL HIGH (ref 0–450)

## 2014-05-15 LAB — GLUCOSE, CAPILLARY
Glucose-Capillary: 138 mg/dL — ABNORMAL HIGH (ref 70–99)
Glucose-Capillary: 101 mg/dL — ABNORMAL HIGH (ref 70–99)
Glucose-Capillary: 140 mg/dL — ABNORMAL HIGH (ref 70–99)
Glucose-Capillary: 131 mg/dL — ABNORMAL HIGH (ref 70–99)

## 2014-05-15 LAB — HEPARIN LEVEL (UNFRACTIONATED): Heparin Unfractionated: 0.62 IU/mL (ref 0.30–0.70)

## 2014-05-15 MED ORDER — TRAZODONE 25 MG HALF TABLET
25.0000 mg | ORAL_TABLET | Freq: Every evening | ORAL | Status: DC | PRN
  Administered 2014-05-15 – 2014-05-16 (×2): 25 mg via ORAL
  Filled 2014-05-15 (×2): qty 1

## 2014-05-15 MED ORDER — BIOTENE DRY MOUTH MT LIQD
15.0000 mL | Freq: Two times a day (BID) | OROMUCOSAL | Status: DC
  Administered 2014-05-15 – 2014-05-20 (×10): 15 mL via OROMUCOSAL

## 2014-05-15 MED ORDER — AMIODARONE HCL 200 MG PO TABS
400.0000 mg | ORAL_TABLET | Freq: Two times a day (BID) | ORAL | Status: DC
  Administered 2014-05-15 – 2014-05-20 (×11): 400 mg via ORAL
  Filled 2014-05-15 (×13): qty 2

## 2014-05-15 NOTE — Evaluation (Signed)
Physical Therapy Evaluation Patient Details Name: Jacqueline Reilly MRN: [id number] DOB: [birth year] Today's Date: 05/15/2014   History of Present Illness  Patient is an 80 yo female admitted with syncope, AF/RVR, pulmonary edema and acute respiratory failure.  .Patient intubated 05/07/14.  Patient with NSTEMI and encephalopathy (resolved).  Patient extubated 05/13/14.  PMH: CHF, CAD, EF 25%, AAA, early dementia.  Clinical Impression  Patient presents with problems listed below.  Will benefit from acute PT to maximize independence prior to discharge home with husband.  Recommend HHPT for continued therapy and [place] aide to assist with ADL's at discharge.  Patient will need to function at Mod I level to return home with husband.    Follow Up Recommendations Home health PT;Supervision/Assistance - 24 hour (Home health aide for ADL's)    Equipment Recommendations  Rolling walker with 5" wheels    Recommendations for Other Services       Precautions / Restrictions Precautions Precautions: Fall Restrictions Weight Bearing Restrictions: No      Mobility  Bed Mobility                  Transfers Overall transfer level: Needs assistance Equipment used: 2 person hand held assist Transfers: Sit to/from Stand Sit to Stand: Min assist;+2 physical assistance         General transfer comment: Verbal cues for hand placement.  Patient with posterior lean during transitions. Cues to shift weight forward over base of support.  Decreased balance in standing.  Ambulation/Gait Ambulation/Gait assistance: Min assist;+2 safety/equipment Ambulation Distance (Feet): 68 Feet Assistive device: 2 person hand held assist Gait Pattern/deviations: Step-through pattern;Decreased step length - right;Decreased step length - left;Decreased stride length;Shuffle;Narrow base of support;Trunk flexed Gait velocity: Decreased Gait velocity interpretation: Below normal speed for age/gender General Gait Details:  Verbal cues to stand upright.  Patient with decreased balance during gait, requiring +2 hand hold assist.  Decreased activity tolerance, with O2 into 80's with ambulation.  Stairs            Wheelchair Mobility    Modified Rankin (Stroke Patients Only)       Balance Overall balance assessment: Needs assistance         Standing balance support: Bilateral upper extremity supported Standing balance-Leahy Scale: Poor                               Pertinent Vitals/Pain O2 sats to 85% with ambulation.  Returned into 90's with 1 minute rest     [place] expects to be discharged to:: Private residence Living Arrangements: Spouse/significant other Available Help at Discharge: Family;Available 24 hours/day (Husband 24 hours.  Brother/other family prn) Type of Home: House Home Access: Stairs to enter Entrance Stairs-Rails: None Entrance Stairs-Number of Steps: 3 Home Layout: One level Home Equipment: Cane - single point (Son is getting shower seat and hand held shower )      Prior Function Level of Independence: Independent               Hand Dominance        Extremity/Trunk Assessment   Upper Extremity Assessment: Generalized weakness           Lower Extremity Assessment: Generalized weakness      Cervical / Trunk Assessment: Kyphotic  Communication   Communication: HOH  Cognition Arousal/Alertness: Awake/alert Behavior During Therapy: Anxious Overall Cognitive Status: Within Functional Limits for tasks assessed  General Comments      Exercises        Assessment/Plan    PT Assessment Patient needs continued PT services  PT Diagnosis Difficulty walking;Abnormality of gait;Generalized weakness   PT Problem List Decreased strength;Decreased activity tolerance;Decreased balance;Decreased mobility;Decreased knowledge of use of DME;Decreased knowledge of precautions;Cardiopulmonary  status limiting activity  PT Treatment Interventions DME instruction;Gait training;Stair training;Functional mobility training;Therapeutic exercise;Therapeutic activities;Patient/family education   PT Goals (Current goals can be found in the Care Plan section) Acute Rehab PT Goals Patient Stated Goal: To get stronger; to go home PT Goal Formulation: With patient/family Time For Goal Achievement: 05/22/14 Potential to Achieve Goals: Good    Frequency Min 3X/week   Barriers to discharge Decreased caregiver support Unsure of husband's ability to assist patient.    Co-evaluation               End of Session Equipment Utilized During Treatment: Gait belt;Oxygen Activity Tolerance: Patient limited by fatigue Patient left: in chair;with call bell/phone within reach;with family/visitor present Nurse Communication: Mobility status         Time: [id number] PT Time Calculation (min): 24 min   Charges:   PT Evaluation $Initial PT Evaluation Tier I: 1 Procedure PT Treatments $Gait Training: 8-22 mins   PT G Codes:          Despina Pole 05/15/2014, 7:09 PM Carita Pian. Sanjuana Kava, [place] Pager [phone]

## 2014-05-15 NOTE — Progress Notes (Signed)
ANTICOAGULATION CONSULT NOTE - Follow Up Consult  Pharmacy Consult for Heparin Indication: chest pain/ACS  Allergies  Allergen Reactions  . Codeine Nausea Only  . Garlic Nausea Only    Labs:  Recent Labs  05/12/14 2110  05/13/14 0420 05/14/14 0400 05/14/14 0410 05/14/14 1630 05/15/14 0415  HGB  --   < > 12.2 11.0*  --   --  10.0*  HCT  --   --  38.5 34.2*  --   --  31.6*  PLT  --   --  262 232  --   --  246  HEPARINUNFRC  --   --  0.62 0.61  --  0.78* 0.62  CREATININE 0.78  --  0.88  --  0.83  --   --   < > = values in this interval not displayed. Estimated Creatinine Clearance: 30 ml/min (by C-G formula based on Cr of 0.83).  Assessment: 80yo female presents with near syncopal episode according to son.  She was found to be in Afib with RVR in the ED. Her troponins were elevated as well as a significant elevation in her BNP.    Continues in afib (amio gtt continued), off pressors but continues on milrinone.  HL now at goal 0.62 after rate decrease yesterday. H/H trending down slowly. Plt stable. No bleeding complications noted.  Cultures are no growth to date currently on azith/ceftriaxone   Goal of Therapy:  Heparin level 0.3-0.7 units/ml Monitor platelets by anticoagulation protocol: Yes   Plan:  Decrease heparin to 850 units / hr - to keep in middle of goal range  Daily heparin level, CBC  Jacqueline Reilly, PharmD.  Clinical Pharmacist Pager [phone]

## 2014-05-15 NOTE — Progress Notes (Signed)
[facility] Progress Note Patient Name: NATAJIA SUDDRETH DOB: [birth year] MRN: [id number]  Date of Service  05/15/2014   HPI/Events of Note   Patient complains of anxiety. Wants Rx but elderly with multiple comorbidity.   eICU Interventions   Trazodone PRN for sleep at night.    Intervention Category Minor Interventions: OtherLowry Ram, Maghan Jessee R. 05/15/2014, 4:39 PM

## 2014-05-15 NOTE — Progress Notes (Signed)
SUBJECTIVE:  Sitting up in chair and states her breathing is much improved!!  OBJECTIVE:   Vitals:   Filed Vitals:   05/15/14 0600 05/15/14 0700 05/15/14 0745 05/15/14 0800  BP: 96/54   101/61  Pulse: 58   60  Temp:   97.3 F (36.3 C)   TempSrc:   Oral   Resp: 27 26  35  Height:      Weight: 89 lb 3.2 oz (40.461 kg)     SpO2: 97%   98%   I&O's:   Intake/Output Summary (Last 24 hours) at 05/15/14 1005 Last data filed at 05/15/14 0900  Gross per 24 hour  Intake 1405.55 ml  Output   1720 ml  Net -314.45 ml   TELEMETRY: Reviewed telemetry pt in atrial flutter with 2:1 block and occasional PVC's     PHYSICAL EXAM General: Well developed, well nourished, in no acute distress Head: Eyes PERRLA, No xanthomas.   Normal cephalic and atramatic  Lungs:   Coarse rhonchi and upper airway congestion Heart:   HRRR with occasional ectopy S1 S2 Pulses are 2+ & equal.            No carotid bruit. No JVD.  No abdominal bruits. No femoral bruits. Abdomen: Bowel sounds are positive, abdomen soft and non-tender without masses Extremities:   No clubbing, cyanosis or edema.  DP +1 Neuro: Alert and oriented X 3. Psych:  Good affect, responds appropriately   LABS: Basic Metabolic Panel:  Recent Labs  05/13/14 0420 05/14/14 0410  NA 142 138  K 4.3 3.5*  CL 94* 89*  CO2 35* 38*  GLUCOSE 105* 94  BUN 54* 47*  CREATININE 0.88 0.83  CALCIUM 9.0 8.8   Liver Function Tests: No results found for this basename: AST, ALT, ALKPHOS, BILITOT, PROT, ALBUMIN,  in the last 72 hours No results found for this basename: LIPASE, AMYLASE,  in the last 72 hours CBC:  Recent Labs  05/14/14 0400 05/15/14 0415  WBC 9.3 9.7  HGB 11.0* 10.0*  HCT 34.2* 31.6*  MCV 83.0 83.8  PLT 232 246   Cardiac Enzymes: No results found for this basename: CKTOTAL, CKMB, CKMBINDEX, TROPONINI,  in the last 72 hours BNP: No components found with this basename: POCBNP,  D-Dimer: No results found for this  basename: DDIMER,  in the last 72 hours Hemoglobin A1C: No results found for this basename: HGBA1C,  in the last 72 hours Fasting Lipid Panel: No results found for this basename: CHOL, HDL, LDLCALC, TRIG, CHOLHDL, LDLDIRECT,  in the last 72 hours Thyroid Function Tests: No results found for this basename: TSH, T4TOTAL, FREET3, T3FREE, THYROIDAB,  in the last 72 hours Anemia Panel: No results found for this basename: VITAMINB12, FOLATE, FERRITIN, TIBC, IRON, RETICCTPCT,  in the last 72 hours Coag Panel:   Lab Results  Component Value Date   INR 1.63* 05/07/2014   INR 1.14 04/24/2014   INR 0.90 07/22/2012    RADIOLOGY: Dg Chest 2 View  04/23/2014   CLINICAL DATA:  Shortness of breath for 1 week  EXAM: CHEST  2 VIEW  COMPARISON:  04/20/2009  FINDINGS: There is cardiomegaly which is likely progressed from previous. Chronic aortic tortuosity. Aortic annular calcification noted in the lateral projection. New diffuse interstitial abnormality with Kerley B-lines. There is cephalized pulmonary blood flow. Some of the opacities appears somewhat coarse, especially at the bases. No evidence of effusion or pneumothorax.  Bilateral glenohumeral osteoarthritis with loose bodies.  IMPRESSION: 1. CHF. 2.  Question background chronic lung disease/fibrosis, which would be new from 2010 comparison.   Electronically Signed   By: Jorje Guild M.D.   On: 04/23/2014 03:10   Dg Chest Port 1 View  05/14/2014   CLINICAL DATA:  Pneumonia. Acute respiratory failure with hypoxia. Acute myocardial infarct. Atrial fibrillation.  EXAM: PORTABLE CHEST - 1 VIEW  COMPARISON:  05/12/2014  FINDINGS: The endotracheal tube and nasogastric tube have been removed. Right jugular central venous catheter remains in appropriate position.  Mild cardiomegaly stable. Diffuse interstitial edema pattern shows no significant change. No focal consolidation or pleural effusion demonstrated on this portable exam.  IMPRESSION: No significant change in  cardiomegaly and diffuse interstitial edema pattern.   Electronically Signed   By: Earle Gell M.D.   On: 05/14/2014 11:37   Dg Chest Port 1 View  05/12/2014   CLINICAL DATA:  Acute respiratory failure.  Ventilator.  EXAM: PORTABLE CHEST - 1 VIEW  COMPARISON:  05/10/2014  FINDINGS: Endotracheal tube, central line, and NG tube appear in good position. There has been further clearing of the interstitial edema superimposed on chronic lung disease. Cardiomegaly process. Pulmonary vascularity has improved. There is still slight prominence of the right pulmonary arteries.  IMPRESSION: Further improvement in interstitial edema superimposed on chronic lung disease.   Electronically Signed   By: Rozetta Nunnery M.D.   On: 05/12/2014 07:56   Dg Chest Port 1 View  05/10/2014   CLINICAL DATA:  Follow-up pulmonary edema.  EXAM: PORTABLE CHEST - 1 VIEW  COMPARISON:  05/09/2014.  FINDINGS: Endotracheal tube tip 3.6 cm above the carina.  Nasogastric tube courses below the diaphragm. Tip is not included on the present exam.  Right central line tip distal superior vena cava/ cavoatrial junction level.  Cardiomegaly.  Calcified tortuous aorta.  Pulmonary edema similar to the prior exam.  No segmental consolidation. Evaluation left lung base limited by cardiomegaly.  Shoulder joint degenerative changes. Possible bone infarct proximal left humerus.  IMPRESSION: No significant change in degree of pulmonary edema. Please see above.   Electronically Signed   By: Chauncey Cruel M.D.   On: 05/10/2014 08:17   Dg Chest Port 1 View  05/09/2014   CLINICAL DATA:  Respiratory failure.  EXAM: PORTABLE CHEST - 1 VIEW  COMPARISON:  05/08/2014  FINDINGS: Endotracheal tube tip is approximately 2.5 cm above the carina. There has been interval placement of a gastric decompression tube with the tip extending into the stomach. Lungs showed interval worsening of pulmonary edema. There is stable cardiomegaly. No significant pleural fluid identified. No  pneumothorax.  IMPRESSION: Worsening of pulmonary edema.   Electronically Signed   By: Aletta Edouard M.D.   On: 05/09/2014 07:39   Dg Chest Port 1 View  05/08/2014   CLINICAL DATA:  Endotracheal tube and central line placement.  EXAM: PORTABLE CHEST - 1 VIEW  COMPARISON:  05/07/2014  FINDINGS: Endotracheal tube placed with tip measuring 3.3 cm above the carina. Right central venous catheter placed with tip over the cavoatrial junction. No pneumothorax. Cardiac enlargement with pulmonary vascular congestion and diffuse perihilar opacities consistent with edema. Edema pattern is progressing since previous study. No blunting of costophrenic angles. No pneumothorax. Degenerative changes in the shoulders.  IMPRESSION: Appliances appear to be in satisfactory location. Cardiac enlargement with pulmonary vascular congestion and progressive edema since previous study.   Electronically Signed   By: Lucienne Capers M.D.   On: 05/08/2014 01:34   Dg Chest Port 1 View  05/07/2014  CLINICAL DATA:  Syncopal episode  EXAM: PORTABLE CHEST - 1 VIEW  COMPARISON:  04/23/2014  FINDINGS: Cardiac shadow remains enlarged. Diffuse fibrotic changes are again identified. No sizable effusion or focal infiltrate is noted. Chronic changes about shoulder joints are seen.  IMPRESSION: Chronic changes without acute abnormality.   Electronically Signed   By: Inez Catalina M.D.   On: 05/07/2014 10:04   Dg Abd Portable 1v  05/08/2014   CLINICAL DATA:  OG tube placement  EXAM: PORTABLE ABDOMEN - 1 VIEW  COMPARISON:  None.  FINDINGS: Enteric tube tip is localized to the left lower abdomen consistent with location in the distal stomach. Visualized bowel gas pattern is unremarkable. Cardiac enlargement with probable interstitial infiltration in the lungs.  IMPRESSION: Enteric tube tip is in the left abdomen consistent with location in the distal stomach.   Electronically Signed   By: Lucienne Capers M.D.   On: 05/08/2014 05:38   Assessment:    1. A fib RVR - had converted to NSR but appears to be back in atrial flutter with CVR  2. Syncope  3. NSTEMI  4. Acute Respiratory Failure- Suspect primarily pulmonary edema.  5. Acute/Chronic Systolic Heart Failure: EF 25-30%, LV dysfunction has been out of proportion to CAD.  6. Hyperkalemia  7. Early Dementia  8. Hypokalemia  9. CAD  10. ?PNA: on antibiotics.  11. Limited Code- No cpr . No defibrillation.  Plan/Discussion:   CCM following. Recommendations appreciated.  Was back in NSR but now appears to be back in atrial flutter with CVR.   Change amio to PO and continue Heparin.  Respiratory failure with pulmonary edema in setting of atrial fibrillation with RVR and underlying systolic CHF. Weight down 9 pounds with CVP 3-4. Co-ox 67% on milrinone. Extubated and doing well. She has a lot of upper airway rhonchi and secretions. Chest xray yesterday showed persistent pulmonary edema.  She has no LE edema. - Home torsemide regimen was started yesterday and will continue.  Will recheck BNP. - Continue low dose Milrinone (0.125 mcg/kg/min). Can try to start low dose hydralazine/nitrates as milrinone is weaned but BP too low today to add.  - recheck BMET  - continue antibiotic  Sueanne Margarita, MD  05/15/2014  10:05 AM

## 2014-05-16 LAB — GLUCOSE, CAPILLARY
Glucose-Capillary: 102 mg/dL — ABNORMAL HIGH (ref 70–99)
Glucose-Capillary: 98 mg/dL (ref 70–99)
Glucose-Capillary: 100 mg/dL — ABNORMAL HIGH (ref 70–99)
Glucose-Capillary: 109 mg/dL — ABNORMAL HIGH (ref 70–99)
Glucose-Capillary: 123 mg/dL — ABNORMAL HIGH (ref 70–99)

## 2014-05-16 LAB — CARBOXYHEMOGLOBIN
Carboxyhemoglobin: 1.9 % — ABNORMAL HIGH (ref 0.5–1.5)
Carboxyhemoglobin: 1.9 % — ABNORMAL HIGH (ref 0.5–1.5)
Methemoglobin: 0.7 % (ref 0.0–1.5)
Methemoglobin: 0.7 % (ref 0.0–1.5)
O2 Saturation: 64 %
O2 Saturation: 55.2 %
Total hemoglobin: 10.1 g/dL — ABNORMAL LOW (ref 12.0–16.0)
Total hemoglobin: 10.3 g/dL — ABNORMAL LOW (ref 12.0–16.0)

## 2014-05-16 LAB — CBC
HCT: 31.4 % — ABNORMAL LOW (ref 36.0–46.0)
Hemoglobin: 10 g/dL — ABNORMAL LOW (ref 12.0–15.0)
MCH: 26.7 pg (ref 26.0–34.0)
MCHC: 31.8 g/dL (ref 30.0–36.0)
MCV: 83.7 fL (ref 78.0–100.0)
Platelets: 252 10*3/uL (ref 150–400)
RBC: 3.75 MIL/uL — ABNORMAL LOW (ref 3.87–5.11)
RDW: 15.7 % — ABNORMAL HIGH (ref 11.5–15.5)
WBC: 9.9 10*3/uL (ref 4.0–10.5)

## 2014-05-16 LAB — HEPARIN LEVEL (UNFRACTIONATED): Heparin Unfractionated: 0.48 IU/mL (ref 0.30–0.70)

## 2014-05-16 MED ORDER — DIGOXIN 125 MCG PO TABS
0.1250 mg | ORAL_TABLET | Freq: Every day | ORAL | Status: DC
  Administered 2014-05-16 – 2014-05-18 (×3): 0.125 mg via ORAL
  Filled 2014-05-16 (×4): qty 1

## 2014-05-16 MED ORDER — APIXABAN 2.5 MG PO TABS
2.5000 mg | ORAL_TABLET | Freq: Two times a day (BID) | ORAL | Status: DC
  Administered 2014-05-16 – 2014-05-20 (×9): 2.5 mg via ORAL
  Filled 2014-05-16 (×11): qty 1

## 2014-05-16 MED ORDER — MILRINONE IN DEXTROSE 20 MG/100ML IV SOLN
0.1250 ug/kg/min | INTRAVENOUS | Status: DC
  Administered 2014-05-16: 0.125 ug/kg/min via INTRAVENOUS
  Filled 2014-05-16: qty 100

## 2014-05-16 MED ORDER — ENSURE COMPLETE PO LIQD
237.0000 mL | Freq: Two times a day (BID) | ORAL | Status: DC
  Administered 2014-05-17 – 2014-05-20 (×5): 237 mL via ORAL

## 2014-05-16 NOTE — Progress Notes (Signed)
Physical Therapy Treatment Patient Details Name: LAKEIDRA SCHACHT MRN: [id number] DOB: 08/24/1924 Today's Date: 2014/06/06    History of Present Illness Patient is an 80 yo female admitted with syncope, AF/RVR, pulmonary edema and acute respiratory failure.  .Patient intubated 05/07/14.  Patient with NSTEMI and encephalopathy (resolved).  Patient extubated 05/13/14.  PMH: CHF, CAD, EF 25%, AAA, early dementia.    PT Comments    Pt continues to have posterior lean in standing. Husband is frail and will not be able to physically assist pt. Feel pt will need ST-SNF prior to return home.  Follow Up Recommendations  LTACH     Equipment Recommendations  Rolling walker with 5" wheels    Recommendations for Other Services       Precautions / Restrictions Precautions Precautions: Fall    Mobility  Bed Mobility                  Transfers Overall transfer level: Needs assistance Equipment used: Pushed w/c Transfers: Sit to/from Stand Sit to Stand: Mod assist         General transfer comment: Verbal cues for hand placement. Pt with heavy posterior lean coming to stand and in standing.  Ambulation/Gait Ambulation/Gait assistance: Min assist Ambulation Distance (Feet): 200 Feet Assistive device:  (pushed w/c) Gait Pattern/deviations: Step-through pattern;Decreased stride length;Trunk flexed;Narrow base of support     General Gait Details: On standing pt with posterior lean. Once pt began amb pushing w/c this improved.   Stairs            Wheelchair Mobility    Modified Rankin (Stroke Patients Only)       Balance Overall balance assessment: Needs assistance         Standing balance support: Bilateral upper extremity supported Standing balance-Leahy Scale: Poor Standing balance comment: Pt with posterior lean in standing.                    Cognition Arousal/Alertness: Awake/alert Behavior During Therapy: WFL for tasks  assessed/performed Overall Cognitive Status: History of cognitive impairments - at baseline       Memory: Decreased short-term memory              Exercises      General Comments        Pertinent Vitals/Pain SaO2 > 94% on 4L with amb    Home Living                      Prior Function            PT Goals (current goals can now be found in the care plan section) Progress towards PT goals: Progressing toward goals    Frequency  Min 3X/week    PT Plan Discharge plan needs to be updated    Co-evaluation             End of Session Equipment Utilized During Treatment: Gait belt;Oxygen Activity Tolerance: Patient limited by fatigue Patient left: in chair;with call bell/phone within reach;with family/visitor present     Time: [id number] PT Time Calculation (min): 27 min  Charges:  $Gait Training: 23-37 mins                    G Codes:      Dyana Magner 06-Jun-2014, 11:18 AM  Suanne Marker PT [phone]

## 2014-05-16 NOTE — Progress Notes (Signed)
Patient ID: Jacqueline Reilly, female   DOB: 1924-03-24, 80 y.o.   MRN: [id number] Advanced Heart Failure Rounding Note   Subjective:   [name] is an 80 y/o woman with CAD, AAA, systolic HF EF 123XX123, dementia, and CAD and has had two previous coronary interventions including a cutting balloon to her D2 in 2007 and a DES to her mid LCX in 2010. Her LV dysfunction has been out of proportion to her CAD.    Admitted 05/07/14 with syncope and dyspnea. She had new onset A-fib RVR. Troponin was 1.03>  1.59> 2.18  .   She was placed on amiodarone and heparin drip. She developed respiratory distress and required intubation. She converted to NSR but later was bradycardiac with NSVT. Amiodarone temporarily stopped but restarted after hyperkalemia resolved. Hyperkalemia was thought to be contributing to bradycardia.  Dopamine later added for hypotension. Diuresed with IV lasix.    She was started on Milrinone 7/14 due to CO-OX 29%.  Levo added for low BP, now off. She continues on amiodarone drip for A fib. She has also had episodes of bradycardia.30-40s. She got a dose of metolazone along with IV Lasix and diuresed well, allowing extubation Friday.   Today, she is in NSR.  CVP is 1.  She remains on milrinone 0.25.   CO-OX 69%> 29%>43%>milrinone begun>63>65>64%    Objective:   Weight Range:  Vital Signs:   Temp:  [97.3 F (36.3 C)-98.5 F (36.9 C)] 97.3 F (36.3 C) (07/20 0801) Pulse Rate:  [54-84] 58 (07/20 0700) Resp:  [13-37] 24 (07/20 0700) BP: (87-126)/(41-82) 91/41 mmHg (07/20 0600) SpO2:  [90 %-97 %] 97 % (07/20 0700) Weight:  [88 lb 2.9 oz (40 kg)] 88 lb 2.9 oz (40 kg) (07/20 0600) Last BM Date: 05/14/14  Weight change: Filed Weights   05/14/14 0500 05/15/14 0600 05/16/14 0600  Weight: 92 lb 2.4 oz (41.8 kg) 89 lb 3.2 oz (40.461 kg) 88 lb 2.9 oz (40 kg)    Intake/Output:   Intake/Output Summary (Last 24 hours) at 05/16/14 0854 Last data filed at 05/16/14 0700  Gross per 24  hour  Intake 1493.98 ml  Output   1815 ml  Net -321.02 ml     Physical Exam:CVP 9-10 General:  NAD  HEENT: normal Neck: supple. JVP not elevated. Carotids 2+ bilat; no bruits. No lymphadenopathy or thryomegaly appreciated. Cor: PMI nondisplaced. Regular rate & rhythm. No rubs, gallops or murmurs. Lungs: Rhonchi throughout Abdomen: soft, nontender, nondistended. No hepatosplenomegaly. No bruits or masses. Good bowel sounds. Extremities:  No cyanosis, clubbing, rash, edema Neuro: A&O x 3.  Telemetry: SR 70s  Labs: Basic Metabolic Panel:  Recent Labs Lab 05/10/14 0545  05/11/14 0500  05/12/14 0340 05/12/14 1645 05/12/14 2110 05/13/14 0420 05/14/14 0410 05/15/14 1010  NA 142  < > 143  < > 146 140 141 142 138 137  K 4.4  < > 3.3*  < > 4.1 3.7 3.6* 4.3 3.5* 3.6*  CL 100  < > 99  < > 102 95* 93* 94* 89* 88*  CO2 25  < > 29  < > 32 33* 36* 35* 38* 36*  GLUCOSE 149*  < > 120*  < > 86 134* 123* 105* 94 157*  BUN 57*  < > 50*  < > 53* 52* 53* 54* 47* 39*  CREATININE 0.91  < > 0.81  < > 0.85 0.79 0.78 0.88 0.83 0.85  CALCIUM 8.4  < > 8.4  < >  8.5 8.7 9.1 9.0 8.8 8.9  MG 2.5  --  2.3  --  2.4  --   --   --   --   --   PHOS 3.8  --  3.1  --  3.3  --   --   --   --   --   < > = values in this interval not displayed.  Liver Function Tests: No results found for this basename: AST, ALT, ALKPHOS, BILITOT, PROT, ALBUMIN,  in the last 168 hours No results found for this basename: LIPASE, AMYLASE,  in the last 168 hours No results found for this basename: AMMONIA,  in the last 168 hours  CBC:  Recent Labs Lab 05/13/14 0420 05/14/14 0400 05/15/14 0415 05/15/14 1545 05/16/14 0400  WBC 14.0* 9.3 9.7 11.2* 9.9  HGB 12.2 11.0* 10.0* 10.8* 10.0*  HCT 38.5 34.2* 31.6* 34.1* 31.4*  MCV 83.7 83.0 83.8 83.8 83.7  PLT 262 232 246 286 252    Cardiac Enzymes: No results found for this basename: CKTOTAL, CKMB, CKMBINDEX, TROPONINI,  in the last 168 hours  BNP: BNP (last 3  results)  Recent Labs  05/07/14 0920 05/09/14 0430 05/15/14 1010  PROBNP 15676.0* 23926.0* 7409.0*     Other results:  :   Imaging: Dg Chest Port 1 View  05/14/2014   CLINICAL DATA:  Pneumonia. Acute respiratory failure with hypoxia. Acute myocardial infarct. Atrial fibrillation.  EXAM: PORTABLE CHEST - 1 VIEW  COMPARISON:  05/12/2014  FINDINGS: The endotracheal tube and nasogastric tube have been removed. Right jugular central venous catheter remains in appropriate position.  Mild cardiomegaly stable. Diffuse interstitial edema pattern shows no significant change. No focal consolidation or pleural effusion demonstrated on this portable exam.  IMPRESSION: No significant change in cardiomegaly and diffuse interstitial edema pattern.   Electronically Signed   By: Earle Gell M.D.   On: 05/14/2014 11:37     Medications:     Scheduled Medications: . amiodarone  400 mg Oral BID  . antiseptic oral rinse  15 mL Mouth Rinse BID  . aspirin  81 mg Oral Daily  . azithromycin  500 mg Intravenous QHS  . cefTRIAXone (ROCEPHIN)  IV  1 g Intravenous QHS  . insulin aspart  0-15 Units Subcutaneous TID WC  . insulin aspart  0-5 Units Subcutaneous QHS  . multivitamin with minerals  1 tablet Oral Daily  . sodium chloride  10-40 mL Intracatheter Q12H  . sodium chloride  3 mL Intravenous Q12H    Infusions: . sodium chloride 20 mL/hr at 05/12/14 0600  . heparin 800 Units/hr (05/15/14 1627)  . milrinone      PRN Medications: sodium chloride, ondansetron (ZOFRAN) IV, sodium chloride, sodium chloride, traZODone   Assessment:  1. A fib RVR 2. Syncope 3. NSTEMI 4. Acute Respiratory Failure- Intubated.  Suspect primarily pulmonary edema.  5. Acute/Chronic Systolic Heart Failure: EF 25-30%, LV dysfunction has been out of proportion to CAD.  6. Hyperkalemia  7. Early Dementia 8. Hypokalemia 9. CAD 10. ?PNA: on antibiotics.  11. Limited Code- No cpr . No defibrillation.     Plan/Discussion:    No evidence for PNA, may stop antibiotics.   Back in NSR.  Continue amiodarone po, will stop heparin gtt and start Eliquis 2.5 mg bid.    Respiratory failure with pulmonary edema in setting of atrial fibrillation with RVR and underlying systolic CHF.  CVP 1, will hold torsemide today.  Co-ox 64% on milrinone.  - Decrease  milrinone to 0.125 mcg/kg/min, repeat coox this afternoon.  Will hopefully titrate off.  - BP soft, will not add afterload reduction yet.  Would anticipate low dose lisinpril.   Length of Stay: [place] 05/16/2014 8:54 AM

## 2014-05-16 NOTE — Progress Notes (Signed)
NUTRITION FOLLOW UP  INTERVENTION: Ensure Complete po BID, each supplement provides 350 kcal and 13 grams of protein RD to follow for nutrition care plan  NUTRITION DIAGNOSIS: Inadequate oral intake now related to limited appetite as evidenced by PO intake 20-50%, ongoing  Goal: Pt to meet >/= 90% of their estimated nutrition needs, unmet  Monitor:  PO & supplemental intake, weight, labs, I/O's  ASSESSMENT: 80 yo with CAD and CHF ( EF 25% ) admitted 7/11 with syncope, AF/RVR, pulmonary edema and acute respiratory failure. Failed NIMV. Intubated.  Patient extubated 7/17.  TF (Vital AF 1.2 formula) discontinued via OGT with extubation.  Currently on a Heart Healthy diet.  PO intake variable at 20-50% per flowsheet records.  Would benefit from addition of oral nutrition supplements.  RD to order.  Cardiology note 7/19 reviewed.  Patient with upper airway rhonchi and secretions. Chest X-ray 7/18 showed persistent pulmonary edema.  Height: Ht Readings from Last 1 Encounters:  05/07/14 5' (1.524 m)    Weight: -----> stable Wt Readings from Last 1 Encounters:  05/16/14 88 lb 2.9 oz (40 kg)    7/19                   89 lb 7/18               92 lb 7/17    93 lb 7/16    98 lb 7/15  101 lb 7/14    98 lb 7/13    96 lb 7/12    94 lb 7/11    92 lb  BMI:  Body mass index is 17.22 kg/(m^2).  Estimated Nutritional Needs: Kcal: 1200-1400 Protein: 65-75 gm Fluid: per MD  Skin: Intact  Diet Order: Cardiac   Intake/Output Summary (Last 24 hours) at 05/16/14 1500 Last data filed at 05/16/14 1400  Gross per 24 hour  Intake 1115.7 ml  Output   1695 ml  Net -579.3 ml    Labs:   Recent Labs Lab 05/10/14 0545  05/11/14 0500  05/12/14 0340  05/13/14 0420 05/14/14 0410 05/15/14 1010  NA 142  < > 143  < > 146  < > 142 138 137  K 4.4  < > 3.3*  < > 4.1  < > 4.3 3.5* 3.6*  CL 100  < > 99  < > 102  < > 94* 89* 88*  CO2 25  < > 29  < > 32  < > 35* 38* 36*  BUN 57*  < > 50*   < > 53*  < > 54* 47* 39*  CREATININE 0.91  < > 0.81  < > 0.85  < > 0.88 0.83 0.85  CALCIUM 8.4  < > 8.4  < > 8.5  < > 9.0 8.8 8.9  MG 2.5  --  2.3  --  2.4  --   --   --   --   PHOS 3.8  --  3.1  --  3.3  --   --   --   --   GLUCOSE 149*  < > 120*  < > 86  < > 105* 94 157*  < > = values in this interval not displayed.  CBG (last 3)   Recent Labs  05/15/14 2205 05/16/14 0759 05/16/14 1153  GLUCAP 131* 98 100*    Scheduled Meds: . amiodarone  400 mg Oral BID  . antiseptic oral rinse  15 mL Mouth Rinse BID  . apixaban  2.5 mg  Oral BID  . insulin aspart  0-15 Units Subcutaneous TID WC  . insulin aspart  0-5 Units Subcutaneous QHS  . multivitamin with minerals  1 tablet Oral Daily  . sodium chloride  10-40 mL Intracatheter Q12H  . sodium chloride  3 mL Intravenous Q12H    Continuous Infusions: . sodium chloride 20 mL/hr at 05/12/14 0600  . milrinone 0.125 mcg/kg/min (05/16/14 1400)    Past Medical History  Diagnosis Date  . PVD (peripheral vascular disease)   . Ventricular dysfunction     left; ischemic  . Hyperlipidemia   . AAA (abdominal aortic aneurysm)   . Renal artery stenosis   . Hypertension   . Coronary artery disease 2007    moderate ASCAD of the left system s/p PCI of the D2 and PCI of the left circ 03/2009  . CHF (congestive heart failure)     Past Surgical History  Procedure Laterality Date  . Retinal cryopexy      right eye (for retinal detachment)  . Angioplasty    . Coronary stent placement      Arthur Holms, RD, LDN Pager #: [phone] After-Hours Pager #: [phone]

## 2014-05-17 LAB — CARBOXYHEMOGLOBIN
Carboxyhemoglobin: 2.3 % — ABNORMAL HIGH (ref 0.5–1.5)
Methemoglobin: 1.1 % (ref 0.0–1.5)
O2 Saturation: 53.4 %
Total hemoglobin: 10 g/dL — ABNORMAL LOW (ref 12.0–16.0)

## 2014-05-17 LAB — BASIC METABOLIC PANEL
Anion gap: 13 (ref 5–15)
BUN: 26 mg/dL — ABNORMAL HIGH (ref 6–23)
CO2: 34 mEq/L — ABNORMAL HIGH (ref 19–32)
Calcium: 8.9 mg/dL (ref 8.4–10.5)
Chloride: 92 mEq/L — ABNORMAL LOW (ref 96–112)
Creatinine, Ser: 0.79 mg/dL (ref 0.50–1.10)
GFR calc Af Amer: 84 mL/min — ABNORMAL LOW (ref >90)
GFR calc non Af Amer: 72 mL/min — ABNORMAL LOW (ref >90)
Glucose, Bld: 105 mg/dL — ABNORMAL HIGH (ref 70–99)
Potassium: 3.4 mEq/L — ABNORMAL LOW (ref 3.7–5.3)
Sodium: 139 mEq/L (ref 137–147)

## 2014-05-17 LAB — GLUCOSE, CAPILLARY
Glucose-Capillary: 115 mg/dL — ABNORMAL HIGH (ref 70–99)
Glucose-Capillary: 129 mg/dL — ABNORMAL HIGH (ref 70–99)
Glucose-Capillary: 123 mg/dL — ABNORMAL HIGH (ref 70–99)
Glucose-Capillary: 94 mg/dL (ref 70–99)

## 2014-05-17 LAB — CBC
HCT: 30.6 % — ABNORMAL LOW (ref 36.0–46.0)
Hemoglobin: 9.8 g/dL — ABNORMAL LOW (ref 12.0–15.0)
MCH: 27.1 pg (ref 26.0–34.0)
MCHC: 32 g/dL (ref 30.0–36.0)
MCV: 84.5 fL (ref 78.0–100.0)
Platelets: 281 10*3/uL (ref 150–400)
RBC: 3.62 MIL/uL — ABNORMAL LOW (ref 3.87–5.11)
RDW: 15.7 % — ABNORMAL HIGH (ref 11.5–15.5)
WBC: 10.4 10*3/uL (ref 4.0–10.5)

## 2014-05-17 MED ORDER — FUROSEMIDE 40 MG PO TABS: 40.0000 mg | ORAL_TABLET | Freq: Every day | ORAL | Status: DC

## 2014-05-17 MED ORDER — POTASSIUM CHLORIDE CRYS ER 20 MEQ PO TBCR: 20.0000 meq | EXTENDED_RELEASE_TABLET | Freq: Once | ORAL | Status: DC

## 2014-05-17 MED ORDER — POTASSIUM CHLORIDE CRYS ER 20 MEQ PO TBCR
20.0000 meq | EXTENDED_RELEASE_TABLET | Freq: Two times a day (BID) | ORAL | Status: DC
  Administered 2014-05-17 – 2014-05-20 (×7): 20 meq via ORAL
  Filled 2014-05-17 (×8): qty 1

## 2014-05-17 MED ORDER — TORSEMIDE 20 MG PO TABS
40.0000 mg | ORAL_TABLET | Freq: Every day | ORAL | Status: DC
  Administered 2014-05-17 – 2014-05-18 (×2): 40 mg via ORAL
  Filled 2014-05-17 (×3): qty 2

## 2014-05-17 NOTE — Progress Notes (Signed)
Patient ID: Jacqueline Reilly, female   DOB: November 03, 1923, 80 y.o.   MRN: [id number] Advanced Heart Failure Rounding Note   Subjective:   Jacqueline Reilly Jacqueline Reilly) is an 80 y/o woman with CAD, AAA, systolic HF EF 123XX123, dementia, and CAD and has had two previous coronary interventions including a cutting balloon to her D2 in 2007 and a DES to her mid LCX in 2010. Her LV dysfunction has been out of proportion to her CAD.    Admitted 05/07/14 with syncope and dyspnea. She had new onset A-fib RVR. Troponin was 1.03>  1.59> 2.18  .   She was placed on amiodarone and heparin drip. She developed respiratory distress and required intubation. She converted to NSR but later was bradycardiac with NSVT. Amiodarone temporarily stopped but restarted after hyperkalemia resolved. Hyperkalemia was thought to be contributing to bradycardia.  Dopamine later added for hypotension. Diuresed with IV lasix.    She was started on Milrinone 7/14 due to CO-OX 29%.  Levo added for low BP, now off.   Yesterday stopped heparin gtt and started Eliquis 2.5 mg BID. Milrinone weaned down to 0.125. SBP 80-90s. Denies SOB, orthopnea or CP. Working with PT and recommendations for LTAC or SNF when discharged.   CO-OX 69%> 29%>43%>milrinone begun>63>65>64%>55%>53%   Objective:   Weight Range:  Vital Signs:   Temp:  [97.3 F (36.3 C)-98.4 F (36.9 C)] 97.7 F (36.5 C) (07/21 0200) Pulse Rate:  [38-86] 74 (07/21 0600) Resp:  [19-41] 25 (07/21 0600) BP: (81-118)/(44-93) 93/62 mmHg (07/21 0600) SpO2:  [90 %-100 %] 90 % (07/21 0600) Weight:  [86 lb 13.8 oz (39.4 kg)] 86 lb 13.8 oz (39.4 kg) (07/21 0400) Last BM Date: 05/16/14  Weight change: Filed Weights   05/15/14 0600 05/16/14 0600 05/17/14 0400  Weight: 89 lb 3.2 oz (40.461 kg) 88 lb 2.9 oz (40 kg) 86 lb 13.8 oz (39.4 kg)    Intake/Output:   Intake/Output Summary (Last 24 hours) at 05/17/14 0654 Last data filed at 05/17/14 0600  Gross per 24 hour  Intake  505.7 ml  Output     850 ml  Net -344.3 ml     Physical Exam General:  NAD  HEENT: normal Neck: supple. JVP not elevated. Carotids 2+ bilat; no bruits. No lymphadenopathy or thryomegaly appreciated. Cor: PMI nondisplaced. Regular rate & rhythm. No rubs, gallops or murmurs. Lungs: Dry crackles at bases bilaterally.  Abdomen: soft, nontender, nondistended. No hepatosplenomegaly. No bruits or masses. Good bowel sounds. Extremities:  No cyanosis, clubbing, rash, edema Neuro: A&O x 3.   Telemetry: SR 70s  Labs: Basic Metabolic Panel:  Recent Labs Lab 05/11/14 0500  05/12/14 0340  05/12/14 2110 05/13/14 0420 05/14/14 0410 05/15/14 1010 05/17/14 0345  NA 143  < > 146  < > 141 142 138 137 139  K 3.3*  < > 4.1  < > 3.6* 4.3 3.5* 3.6* 3.4*  CL 99  < > 102  < > 93* 94* 89* 88* 92*  CO2 29  < > 32  < > 36* 35* 38* 36* 34*  GLUCOSE 120*  < > 86  < > 123* 105* 94 157* 105*  BUN 50*  < > 53*  < > 53* 54* 47* 39* 26*  CREATININE 0.81  < > 0.85  < > 0.78 0.88 0.83 0.85 0.79  CALCIUM 8.4  < > 8.5  < > 9.1 9.0 8.8 8.9 8.9  MG 2.3  --  2.4  --   --   --   --   --   --  PHOS 3.1  --  3.3  --   --   --   --   --   --   < > = values in this interval not displayed.  Liver Function Tests: No results found for this basename: AST, ALT, ALKPHOS, BILITOT, PROT, ALBUMIN,  in the last 168 hours No results found for this basename: LIPASE, AMYLASE,  in the last 168 hours No results found for this basename: AMMONIA,  in the last 168 hours  CBC:  Recent Labs Lab 05/14/14 0400 05/15/14 0415 05/15/14 1545 05/16/14 0400 05/17/14 0345  WBC 9.3 9.7 11.2* 9.9 10.4  HGB 11.0* 10.0* 10.8* 10.0* 9.8*  HCT 34.2* 31.6* 34.1* 31.4* 30.6*  MCV 83.0 83.8 83.8 83.7 84.5  PLT 232 246 286 252 281    Cardiac Enzymes: No results found for this basename: CKTOTAL, CKMB, CKMBINDEX, TROPONINI,  in the last 168 hours  BNP: BNP (last 3 results)  Recent Labs  05/07/14 0920 05/09/14 0430 05/15/14 1010  PROBNP 15676.0*  23926.0* 7409.0*     Other results:  :   Imaging: No results found.   Medications:     Scheduled Medications: . amiodarone  400 mg Oral BID  . antiseptic oral rinse  15 mL Mouth Rinse BID  . apixaban  2.5 mg Oral BID  . digoxin  0.125 mg Oral Daily  . feeding supplement (ENSURE COMPLETE)  237 mL Oral BID BM  . insulin aspart  0-15 Units Subcutaneous TID WC  . insulin aspart  0-5 Units Subcutaneous QHS  . multivitamin with minerals  1 tablet Oral Daily  . sodium chloride  10-40 mL Intracatheter Q12H  . sodium chloride  3 mL Intravenous Q12H    Infusions: . sodium chloride 10 mL/hr at 05/16/14 2100  . milrinone 0.125 mcg/kg/min (05/16/14 2100)    PRN Medications: sodium chloride, ondansetron (ZOFRAN) IV, sodium chloride, sodium chloride, traZODone   Assessment:  1. A fib RVR 2. Syncope 3. NSTEMI 4. Acute Respiratory Failure- Intubated.  Suspect primarily pulmonary edema.  5. Acute/Chronic Systolic Heart Failure: EF 25-30%, LV dysfunction has been out of proportion to CAD.  6. Hyperkalemia  7. Early Dementia 8. Hypokalemia 9. CAD 10. ?PNA: on antibiotics.  11. Limited Code- No cpr . No defibrillation.  12. Inadequate oral intake related to limited appetite 13. Hypokalemia    Plan/Discussion:    No evidence for PNA, may stop antibiotics. Still having lots of rhonchi throughout, encourage pulmonary toilet.  Remains in NSR, continue amiodarone PO and Eliquis 2.5 mg BID.  Concerns for pt going home by herself and PT recommends SNF/LTAC. Talked with son this am and they are willing to pay for aides around the clock. Will consult social worker/case manager to help set this up. Patient will need a walker/rollator as well.   K+ low will supplement.  Co-ox dropped to 53%with wean of milrinone to 0.125.  Digoxin started.  Patient and family would like to try if at all possible to go home without milrinone. Will turn off today and if co-ox remains stable then can go  home, if not will need to restart and go home with milrinone.  Will need to check dig level next week.   Transfer to floor.  Length of Stay: Baker B NP-C 05/17/2014 6:54 AM  Patient seen with NP, agree with the above note.  Co-ox 53% on milrinone 0.125.  Started digoxin and will stop milrinone today.  Add back torsemide 40 mg daily and  replete K.  Continue amiodarone and reduced dose apixaban.    Loralie Champagne 05/17/2014 7:28 AM

## 2014-05-17 NOTE — Progress Notes (Signed)
CARDIAC REHAB PHASE I   PRE:  Rate/Rhythm: 76 SR  BP:  Supine: 90/58  Sitting:   Standing:    SaO2: 93% 2L  MODE:  Ambulation: 190 ft   POST:  Rate/Rhythm: 79 SR  BP:  Supine:   Sitting: 92/60  Standing:    SaO2: 90% 2L 1345-1418 Pt walked 190 ft on 2L with gait belt use and rolling walker. Pt stopped a couple of times to rest. Some DOE. Checked sats once during walk at 90% on 2L. Pt had difficulty following directions during walk. To bed with bed alarm on. Husband in room. Pt appreciative of walk.   Graylon Good, RN BSN  05/17/2014 2:13 PM   76 SR

## 2014-05-18 LAB — CBC
HCT: 31.5 % — ABNORMAL LOW (ref 36.0–46.0)
Hemoglobin: 10 g/dL — ABNORMAL LOW (ref 12.0–15.0)
MCH: 27.5 pg (ref 26.0–34.0)
MCHC: 31.7 g/dL (ref 30.0–36.0)
MCV: 86.5 fL (ref 78.0–100.0)
Platelets: 313 10*3/uL (ref 150–400)
RBC: 3.64 MIL/uL — ABNORMAL LOW (ref 3.87–5.11)
RDW: 16.1 % — ABNORMAL HIGH (ref 11.5–15.5)
WBC: 9.2 10*3/uL (ref 4.0–10.5)

## 2014-05-18 LAB — BASIC METABOLIC PANEL
Anion gap: 9 (ref 5–15)
BUN: 25 mg/dL — ABNORMAL HIGH (ref 6–23)
CO2: 35 mEq/L — ABNORMAL HIGH (ref 19–32)
Calcium: 8.8 mg/dL (ref 8.4–10.5)
Chloride: 96 mEq/L (ref 96–112)
Creatinine, Ser: 0.82 mg/dL (ref 0.50–1.10)
GFR calc Af Amer: 72 mL/min — ABNORMAL LOW (ref >90)
GFR calc non Af Amer: 62 mL/min — ABNORMAL LOW (ref >90)
Glucose, Bld: 93 mg/dL (ref 70–99)
Potassium: 4.1 mEq/L (ref 3.7–5.3)
Sodium: 140 mEq/L (ref 137–147)

## 2014-05-18 LAB — GLUCOSE, CAPILLARY
Glucose-Capillary: 94 mg/dL (ref 70–99)
Glucose-Capillary: 187 mg/dL — ABNORMAL HIGH (ref 70–99)
Glucose-Capillary: 80 mg/dL (ref 70–99)
Glucose-Capillary: 93 mg/dL (ref 70–99)

## 2014-05-18 LAB — CARBOXYHEMOGLOBIN
Carboxyhemoglobin: 1.8 % — ABNORMAL HIGH (ref 0.5–1.5)
Carboxyhemoglobin: 2.1 % — ABNORMAL HIGH (ref 0.5–1.5)
Methemoglobin: 0.7 % (ref 0.0–1.5)
Methemoglobin: 0.8 % (ref 0.0–1.5)
O2 Saturation: 24 %
O2 Saturation: 48 %
Total hemoglobin: 11.1 g/dL — ABNORMAL LOW (ref 12.0–16.0)
Total hemoglobin: 10.7 g/dL — ABNORMAL LOW (ref 12.0–16.0)

## 2014-05-18 MED ORDER — QUETIAPINE FUMARATE 25 MG PO TABS
25.0000 mg | ORAL_TABLET | Freq: Every day | ORAL | Status: AC
  Administered 2014-05-18: 25 mg via ORAL
  Filled 2014-05-18: qty 1

## 2014-05-18 MED ORDER — MILRINONE IN DEXTROSE 20 MG/100ML IV SOLN
0.1250 ug/kg/min | INTRAVENOUS | Status: DC
  Administered 2014-05-18: 0.125 ug/kg/min via INTRAVENOUS
  Filled 2014-05-18 (×2): qty 100

## 2014-05-18 NOTE — Progress Notes (Signed)
CARDIAC REHAB PHASE I   PRE:  Rate/Rhythm: 71 SR  BP:  Supine:   Sitting: 94/60  Standing:    SaO2: 99%2L  MODE:  Ambulation: 264 ft   POST:  Rate/Rhythm: 80 SR  BP:  Supine:   Sitting: 92/60  Standing:    SaO2: 96%2L 1420-1446 Pt walked 264 ft on 2L with rollator,gait belt use, and asst x 2. Pt c/o feeling some SOB. Had difficulty holding to rollator consistently and let go when she had visitor so she could greet her. To recliner after walk. Husband in room.   Graylon Good, RN BSN  05/18/2014 2:43 PM

## 2014-05-18 NOTE — Progress Notes (Signed)
Physical Therapy Treatment Patient Details Name: Jacqueline Reilly MRN: [id number] DOB: April 24, 1924 Today's Date: 06/09/2014    History of Present Illness Patient is an 80 yo female admitted with syncope, AF/RVR, pulmonary edema and acute respiratory failure.  .Patient intubated 05/07/14.  Patient with NSTEMI and encephalopathy (resolved).  Patient extubated 05/13/14.  PMH: CHF, CAD, EF 25%, AAA, early dementia.    PT Comments    Pt making steady progress. Pt requiring O2.  Follow Up Recommendations  Home health PT;Supervision/Assistance - 24 hour (pt/family refusing ST-SNF and are planning to hire 24 hour care.)     Equipment Recommendations  Other (comment) (rollator)    Recommendations for Other Services       Precautions / Restrictions Precautions Precautions: Fall    Mobility  Bed Mobility                  Transfers Overall transfer level: Needs assistance Equipment used: 1 person hand held assist Transfers: Sit to/from Stand Sit to Stand: Min assist         General transfer comment: Assist for balance.  Ambulation/Gait Ambulation/Gait assistance: Min guard Ambulation Distance (Feet): 250 Feet Assistive device: 4-wheeled walker Gait Pattern/deviations: Step-through pattern;Decreased stride length;Narrow base of support     General Gait Details: Verbal cues for steering rollator with pt occasionally bumping into objects.   Stairs            Wheelchair Mobility    Modified Rankin (Stroke Patients Only)       Balance   Sitting-balance support: No upper extremity supported Sitting balance-Leahy Scale: Fair     Standing balance support: No upper extremity supported Standing balance-Leahy Scale: Fair                      Cognition Arousal/Alertness: Awake/alert Behavior During Therapy: WFL for tasks assessed/performed Overall Cognitive Status: History of cognitive impairments - at baseline       Memory: Decreased short-term  memory              Exercises      General Comments        Pertinent Vitals/Pain See flow sheet.    Home Living                      Prior Function            PT Goals (current goals can now be found in the care plan section) Progress towards PT goals: Progressing toward goals    Frequency  Min 3X/week    PT Plan Discharge plan needs to be updated    Co-evaluation             End of Session Equipment Utilized During Treatment: Gait belt;Oxygen Activity Tolerance: Patient tolerated treatment well Patient left: in chair;with call bell/phone within reach;with family/visitor present     Time: 1020-1043 PT Time Calculation (min): 23 min  Charges:  $Gait Training: 23-37 mins                    G Codes:      Domanic Matusek June 09, 2014, 11:14 AM  Suanne Marker PT [phone]

## 2014-05-18 NOTE — Progress Notes (Signed)
Iv team paged to draw carboxyhemoglobin lab. Elmarie Shiley R

## 2014-05-18 NOTE — Discharge Instructions (Signed)
Information on my medicine - ELIQUIS (apixaban)  This medication education was reviewed with me or my healthcare representative as part of my discharge preparation.  The pharmacist that spoke with me during my hospital stay was:  Deboraha Sprang, [facility]  Why was Eliquis prescribed for you? Eliquis was prescribed for you to reduce the risk of a blood clot forming that can cause a stroke if you have a medical condition called atrial fibrillation (a type of irregular heartbeat).  What do You need to know about Eliquis ? Take your Eliquis TWICE DAILY - one tablet in the morning and one tablet in the evening with or without food. If you have difficulty swallowing the tablet whole please discuss with your pharmacist how to take the medication safely.  Take Eliquis exactly as prescribed by your doctor and DO NOT stop taking Eliquis without talking to the doctor who prescribed the medication.  Stopping may increase your risk of developing a stroke.  Refill your prescription before you run out.  After discharge, you should have regular check-up appointments with your healthcare provider that is prescribing your Eliquis.  In the future your dose may need to be changed if your kidney function or weight changes by a significant amount or as you get older.  What do you do if you miss a dose? If you miss a dose, take it as soon as you remember on the same day and resume taking twice daily.  Do not take more than one dose of ELIQUIS at the same time to make up a missed dose.  Important Safety Information A possible side effect of Eliquis is bleeding. You should call your healthcare provider right away if you experience any of the following:   Bleeding from an injury or your nose that does not stop.   Unusual colored urine (red or dark brown) or unusual colored stools (red or black).   Unusual bruising for unknown reasons.   A serious fall or if you hit your head (even if there is no  bleeding).  Some medicines may interact with Eliquis and might increase your risk of bleeding or clotting while on Eliquis. To help avoid this, consult your healthcare provider or pharmacist prior to using any new prescription or non-prescription medications, including herbals, vitamins, non-steroidal anti-inflammatory drugs (NSAIDs) and supplements.  This website has more information on Eliquis (apixaban): [email or web address].

## 2014-05-18 NOTE — Progress Notes (Signed)
SATURATION QUALIFICATIONS: (This note is used to comply with regulatory documentation for home oxygen)  Patient Saturations on Room Air at Rest = 89-91%  Patient Saturations on Room Air while Ambulating = 85%  Patient Saturations on 2 Liters of oxygen while Ambulating = 91%  Please briefly explain why patient needs home oxygen: Patient desaturates with activities.

## 2014-05-18 NOTE — Progress Notes (Signed)
Peripherally Inserted Central Catheter/Midline Placement  The IV Nurse has discussed with the patient and/or persons authorized to consent for the patient, the purpose of this procedure and the potential benefits and risks involved with this procedure.  The benefits include less needle sticks, lab draws from the catheter and patient may be discharged home with the catheter.  Risks include, but not limited to, infection, bleeding, blood clot (thrombus formation), and puncture of an artery; nerve damage and irregular heat beat.  Alternatives to this procedure were also discussed.  PICC/Midline Placement Documentation  PICC / Midline Double Lumen [id number] PICC Left Basilic 43 cm 0 cm (Active)       Aldona Lento L 05/18/2014, 9:09 PM

## 2014-05-18 NOTE — Progress Notes (Signed)
Patient ID: Jacqueline Reilly, female   DOB: [birth year], 80 y.o.   MRN: [id number] Advanced Heart Failure Rounding Note   Subjective:   Jacqueline Reilly) is an 80 y/o woman with AAA, systolic HF EF 123XX123, dementia, and CAD with two previous coronary interventions including a cutting balloon to her D2 in 2007 and a DES to her mid LCX in 2010. Her LV dysfunction has been out of proportion to her CAD.    Admitted 05/07/14 with syncope and dyspnea. She had new onset A-fib RVR. Troponin was 1.03>  1.59> 2.18  .   She was placed on amiodarone and heparin drip. She developed respiratory distress and required intubation. She converted to NSR but later was bradycardiac with NSVT. Amiodarone temporarily stopped but restarted after hyperkalemia resolved. Hyperkalemia was thought to be contributing to bradycardia.  Dopamine later added for hypotension. Diuresed with IV lasix and extubated.    She was started on Milrinone 7/14 due to CO-OX 29%.  Levo added for low BP then weaned off.  She is now off milrinone.  Co-ox was not done today.  SBP 90s.  Denies SOB, orthopnea or CP.  Walked with rehab yesterday, oxygen saturation 90% on 2L [place].   CO-OX 69%> 29%>43%>milrinone begun>63>65>64%>55%>53%   Objective:   Weight Range:  Vital Signs:   Temp:  [97.5 F (36.4 C)-98.7 F (37.1 C)] 97.7 F (36.5 C) (07/22 0555) Pulse Rate:  [58-75] 58 (07/22 0555) Resp:  [14-27] 18 (07/22 0555) BP: (90-113)/(52-68) 93/56 mmHg (07/22 0555) SpO2:  [92 %-99 %] 99 % (07/22 0555) Weight:  [87 lb 3.2 oz (39.554 kg)] 87 lb 3.2 oz (39.554 kg) (07/22 0555) Last BM Date: 05/17/14  Weight change: Filed Weights   05/16/14 0600 05/17/14 0400 05/18/14 0555  Weight: 88 lb 2.9 oz (40 kg) 86 lb 13.8 oz (39.4 kg) 87 lb 3.2 oz (39.554 kg)    Intake/Output:   Intake/Output Summary (Last 24 hours) at 05/18/14 0750 Last data filed at 05/17/14 1300  Gross per 24 hour  Intake  271.5 ml  Output    600 ml  Net -328.5 ml     Physical  Exam General:  NAD  HEENT: normal Neck: supple. JVP not elevated. Carotids 2+ bilat; no bruits. No lymphadenopathy or thryomegaly appreciated. Cor: PMI nondisplaced. Regular rate & rhythm. No rubs, gallops or murmurs. Lungs: Crackles at bases bilaterally with some rhonchi.   Abdomen: soft, nontender, nondistended. No hepatosplenomegaly. No bruits or masses. Good bowel sounds. Extremities:  No cyanosis, clubbing, rash, edema Neuro: A&O x 3.   Telemetry: SR 70s  Labs: Basic Metabolic Panel:  Recent Labs Lab 05/12/14 0340  05/13/14 0420 05/14/14 0410 05/15/14 1010 05/17/14 0345 05/18/14 0635  NA 146  < > 142 138 137 139 140  K 4.1  < > 4.3 3.5* 3.6* 3.4* 4.1  CL 102  < > 94* 89* 88* 92* 96  CO2 32  < > 35* 38* 36* 34* 35*  GLUCOSE 86  < > 105* 94 157* 105* 93  BUN 53*  < > 54* 47* 39* 26* 25*  CREATININE 0.85  < > 0.88 0.83 0.85 0.79 0.82  CALCIUM 8.5  < > 9.0 8.8 8.9 8.9 8.8  MG 2.4  --   --   --   --   --   --   PHOS 3.3  --   --   --   --   --   --   < > = values  in this interval not displayed.  Liver Function Tests: No results found for this basename: AST, ALT, ALKPHOS, BILITOT, PROT, ALBUMIN,  in the last 168 hours No results found for this basename: LIPASE, AMYLASE,  in the last 168 hours No results found for this basename: AMMONIA,  in the last 168 hours  CBC:  Recent Labs Lab 05/15/14 0415 05/15/14 1545 05/16/14 0400 05/17/14 0345 05/18/14 0635  WBC 9.7 11.2* 9.9 10.4 9.2  HGB 10.0* 10.8* 10.0* 9.8* 10.0*  HCT 31.6* 34.1* 31.4* 30.6* 31.5*  MCV 83.8 83.8 83.7 84.5 86.5  PLT 246 286 252 281 313    Cardiac Enzymes: No results found for this basename: CKTOTAL, CKMB, CKMBINDEX, TROPONINI,  in the last 168 hours  BNP: BNP (last 3 results)  Recent Labs  05/07/14 0920 05/09/14 0430 05/15/14 1010  PROBNP 15676.0* 23926.0* 7409.0*     Other results:  :   Imaging: No results found.   Medications:     Scheduled Medications: . amiodarone   400 mg Oral BID  . antiseptic oral rinse  15 mL Mouth Rinse BID  . apixaban  2.5 mg Oral BID  . digoxin  0.125 mg Oral Daily  . feeding supplement (ENSURE COMPLETE)  237 mL Oral BID BM  . insulin aspart  0-15 Units Subcutaneous TID WC  . insulin aspart  0-5 Units Subcutaneous QHS  . multivitamin with minerals  1 tablet Oral Daily  . potassium chloride  20 mEq Oral BID  . sodium chloride  10-40 mL Intracatheter Q12H  . torsemide  40 mg Oral Daily    Infusions: . sodium chloride 10 mL/hr at 05/16/14 2100    PRN Medications: sodium chloride, ondansetron (ZOFRAN) IV, sodium chloride, sodium chloride, traZODone   Assessment:  1. A fib RVR now holding NSR on amiodarone.  2. Syncope 3. NSTEMI 4. Acute Respiratory Failure- Intubated.  Suspect primarily pulmonary edema.  5. Acute/Chronic Systolic Heart Failure: EF 25-30%, LV dysfunction has been out of proportion to CAD.  6. Hyperkalemia  7. Early Dementia 8. Hypokalemia 9. CAD 10. ?PNA: on antibiotics.  11. Limited Code: No cpr. No defibrillation.  12. Inadequate oral intake related to limited appetite 13. Hypokalemia  Plan/Discussion:   No evidence for PNA,now off antibiotics. Still having lots of rhonchi throughout, encourage pulmonary toilet.  Remains in NSR, continue amiodarone and Eliquis 2.5 mg BID.  Concerns for pt going home by herself and PT initially recommended SNF/LTAC. Talked with son this am and they are willing to pay for aides around the clock. Will consult social worker/case manager to help set this up. Patient will need a walker/rollator as well. She may need home oxygen.   She is now off milrinone but co-ox not done this morning.  Will get co-ox.  She is currently on digoxin 0.125 daily but will likely decrease to 0.0625 daily at discharge given small size.  She is on torsemide 40 mg daily and does not look volume overloaded.   Continue to work with PT, may be ready to leave hospital tomorrow.   Length of  Stay: [place] 05/18/2014 7:50 AM

## 2014-05-19 LAB — GLUCOSE, CAPILLARY
Glucose-Capillary: 83 mg/dL (ref 70–99)
Glucose-Capillary: 143 mg/dL — ABNORMAL HIGH (ref 70–99)
Glucose-Capillary: 72 mg/dL (ref 70–99)
Glucose-Capillary: 111 mg/dL — ABNORMAL HIGH (ref 70–99)

## 2014-05-19 LAB — CBC
HCT: 30.4 % — ABNORMAL LOW (ref 36.0–46.0)
Hemoglobin: 9.6 g/dL — ABNORMAL LOW (ref 12.0–15.0)
MCH: 27.7 pg (ref 26.0–34.0)
MCHC: 31.6 g/dL (ref 30.0–36.0)
MCV: 87.6 fL (ref 78.0–100.0)
Platelets: 311 10*3/uL (ref 150–400)
RBC: 3.47 MIL/uL — ABNORMAL LOW (ref 3.87–5.11)
RDW: 16.6 % — ABNORMAL HIGH (ref 11.5–15.5)
WBC: 8.8 10*3/uL (ref 4.0–10.5)

## 2014-05-19 LAB — BASIC METABOLIC PANEL
Anion gap: 6 (ref 5–15)
BUN: 29 mg/dL — ABNORMAL HIGH (ref 6–23)
CO2: 34 mEq/L — ABNORMAL HIGH (ref 19–32)
Calcium: 8.6 mg/dL (ref 8.4–10.5)
Chloride: 98 mEq/L (ref 96–112)
Creatinine, Ser: 0.91 mg/dL (ref 0.50–1.10)
GFR calc Af Amer: 63 mL/min — ABNORMAL LOW (ref >90)
GFR calc non Af Amer: 55 mL/min — ABNORMAL LOW (ref >90)
Glucose, Bld: 86 mg/dL (ref 70–99)
Potassium: 4.6 mEq/L (ref 3.7–5.3)
Sodium: 138 mEq/L (ref 137–147)

## 2014-05-19 LAB — CARBOXYHEMOGLOBIN
Carboxyhemoglobin: 2.3 % — ABNORMAL HIGH (ref 0.5–1.5)
Methemoglobin: 0.5 % (ref 0.0–1.5)
O2 Saturation: 62.7 %
Total hemoglobin: 7.5 g/dL — ABNORMAL LOW (ref 12.0–16.0)

## 2014-05-19 MED ORDER — TORSEMIDE 20 MG PO TABS
40.0000 mg | ORAL_TABLET | Freq: Two times a day (BID) | ORAL | Status: DC
  Administered 2014-05-19 – 2014-05-20 (×2): 40 mg via ORAL
  Filled 2014-05-19 (×3): qty 2

## 2014-05-19 MED ORDER — DIGOXIN 0.0625 MG HALF TABLET
0.0625 mg | ORAL_TABLET | Freq: Every day | ORAL | Status: DC
  Administered 2014-05-19: 0.0625 mg via ORAL
  Filled 2014-05-19 (×2): qty 1

## 2014-05-19 NOTE — Progress Notes (Signed)
Central line discontinued per order, following protocol. End intact, pressure held for 5 minutes, no bleeding noted. VS stable and frequent vitals set up. Bed rest per protocol. Iv medications switched over to PICC line, new tubing used.  Jacqueline Reilly R

## 2014-05-19 NOTE — Progress Notes (Signed)
CARDIAC REHAB PHASE I   PRE:  Rate/Rhythm: 62 SR  BP:  Supine: 100/60  Sitting:  Standing:    SaO2: 96%2L  MODE:  Ambulation: 190 ft   POST:  Rate/Rhythm: 78  BP:  Supine: 100/60  Sitting:   Standing:    SaO2: 92%2L 1122-1142 Pt walked 190 ft on 2L with rolling walker,gait belt and asst x 2 with unsteady gait. Very sleepy. Back to bed after walk. Bed alarm on.    Graylon Good, RN BSN  05/19/2014 11:39 AM

## 2014-05-19 NOTE — Progress Notes (Signed)
Patient evaluated for community based chronic disease management services with [place] [facility] as a benefit of patient's [employer].  Spoke with Elissa Hefty RNCM regarding service engagement.  Patient is very confused about the roles of home health and [place] [facility].  In light of this [facility] will not engage at this time to reduce the level of confusion for the patient and family.  Patient/family is aware of the availability of services and has been given [facility] literature.  Of note, [facility] Care Management services does not replace or interfere with any services that are arranged by inpatient case management or social work.  For additional questions or referrals please contact Corliss Blacker BSN RN [facility] Liaison at [phone].

## 2014-05-19 NOTE — Progress Notes (Signed)
Patient has been very upset today. Tearful, and has apologized multiple times on her husband's behalf. States that her husband is very worrisome and has caused a scene this morning when he was demanding that her son leave the room. Patient expressed that her husband stresses, and worries a lot. Charge nurse rounded in the room to help further de-escalate the situation between the husband and son. PT is recommending 24 hour supervision. Patient's son is from [place]. It is in my opinion that the husband would not qualify for the patient's "24-hour" supervision.  Family is not interested in SNF, and would like to hire a Charity fundraiser (see case-management note). Patient's husband has expressed multiples concerns about what he call's the patient's "[employer]yco-Physical" status, and is concerned that their be care at his home. Husband re-assured that we are working to help make sure that all things are in order prior to discharge. Roxan Hockey, RN

## 2014-05-19 NOTE — Progress Notes (Signed)
Patient ID: Jacqueline Reilly, female   DOB: 07-13-1924, 80 y.o.   MRN: [id number] Advanced Heart Failure Rounding Note   Subjective:   Jacqueline Reilly) is an 80 y/o woman with AAA, systolic HF EF 123XX123, dementia, and CAD with two previous coronary interventions including a cutting balloon to her D2 in 2007 and a DES to her mid LCX in 2010. Her LV dysfunction has been out of proportion to her CAD.    Admitted 05/07/14 with syncope and dyspnea. She had new onset A-fib RVR. Troponin was 1.03>  1.59> 2.18  .   She was placed on amiodarone and heparin drip. She developed respiratory distress and required intubation. She converted to NSR but later was bradycardiac with NSVT. Amiodarone temporarily stopped but restarted after hyperkalemia resolved. Hyperkalemia was thought to be contributing to bradycardia.  Dopamine later added for hypotension. Diuresed with IV lasix and extubated.    She was started on Milrinone 7/14 due to CO-OX 29%.  Levo added for low BP then weaned off.  She was taken off milrinone yesterday but felt much worse and co-ox was found to be down to 29%.  She was put back on milrinone, co-ox 63% today.  She continues to desaturate with ambulation.   CO-OX 69%> 29%>43%>milrinone begun>63>65>64%>55%>53%>24% off milrinone >63% back on milrinone   Objective:   Weight Range:  Vital Signs:   Temp:  [97.5 F (36.4 C)-98.6 F (37 C)] 97.5 F (36.4 C) (07/23 0418) Pulse Rate:  [58-78] 58 (07/23 0418) Resp:  [18-20] 18 (07/23 0418) BP: (92-107)/(57-72) 94/57 mmHg (07/23 0418) SpO2:  [85 %-100 %] 98 % (07/23 0418) Weight:  [88 lb 6.5 oz (40.1 kg)] 88 lb 6.5 oz (40.1 kg) (07/23 0418) Last BM Date: 05/17/14  Weight change: Filed Weights   05/17/14 0400 05/18/14 0555 05/19/14 0418  Weight: 86 lb 13.8 oz (39.4 kg) 87 lb 3.2 oz (39.554 kg) 88 lb 6.5 oz (40.1 kg)    Intake/Output:   Intake/Output Summary (Last 24 hours) at 05/19/14 1009 Last data filed at 05/19/14 0300  Gross per 24  hour  Intake    960 ml  Output    200 ml  Net    760 ml     Physical Exam General:  NAD  HEENT: normal Neck: supple. JVP not elevated. Carotids 2+ bilat; no bruits. No lymphadenopathy or thryomegaly appreciated. Cor: PMI nondisplaced. Regular rate & rhythm. No rubs, gallops or murmurs. Lungs: Crackles at bases bilaterally with some rhonchi.   Abdomen: soft, nontender, nondistended. No hepatosplenomegaly. No bruits or masses. Good bowel sounds. Extremities:  No cyanosis, clubbing, rash, edema Neuro: A&O x 3.   Telemetry: SR 70s  Labs: Basic Metabolic Panel:  Recent Labs Lab 05/14/14 0410 05/15/14 1010 05/17/14 0345 05/18/14 0635 05/19/14 0500  NA 138 137 139 140 138  K 3.5* 3.6* 3.4* 4.1 4.6  CL 89* 88* 92* 96 98  CO2 38* 36* 34* 35* 34*  GLUCOSE 94 157* 105* 93 86  BUN 47* 39* 26* 25* 29*  CREATININE 0.83 0.85 0.79 0.82 0.91  CALCIUM 8.8 8.9 8.9 8.8 8.6    Liver Function Tests: No results found for this basename: AST, ALT, ALKPHOS, BILITOT, PROT, ALBUMIN,  in the last 168 hours No results found for this basename: LIPASE, AMYLASE,  in the last 168 hours No results found for this basename: AMMONIA,  in the last 168 hours  CBC:  Recent Labs Lab 05/15/14 1545 05/16/14 0400 05/17/14 0345 05/18/14 0635 05/19/14  0500  WBC 11.2* 9.9 10.4 9.2 8.8  HGB 10.8* 10.0* 9.8* 10.0* 9.6*  HCT 34.1* 31.4* 30.6* 31.5* 30.4*  MCV 83.8 83.7 84.5 86.5 87.6  PLT 286 252 281 313 311    Cardiac Enzymes: No results found for this basename: CKTOTAL, CKMB, CKMBINDEX, TROPONINI,  in the last 168 hours  BNP: BNP (last 3 results)  Recent Labs  05/07/14 0920 05/09/14 0430 05/15/14 1010  PROBNP 15676.0* 23926.0* 7409.0*     Other results:  :   Imaging: No results found.   Medications:     Scheduled Medications: . amiodarone  400 mg Oral BID  . antiseptic oral rinse  15 mL Mouth Rinse BID  . apixaban  2.5 mg Oral BID  . digoxin  0.0625 mg Oral Daily  . feeding  supplement (ENSURE COMPLETE)  237 mL Oral BID BM  . insulin aspart  0-15 Units Subcutaneous TID WC  . insulin aspart  0-5 Units Subcutaneous QHS  . multivitamin with minerals  1 tablet Oral Daily  . potassium chloride  20 mEq Oral BID  . sodium chloride  10-40 mL Intracatheter Q12H  . torsemide  40 mg Oral BID    Infusions: . sodium chloride 10 mL/hr at 05/16/14 2100  . milrinone 0.125 mcg/kg/min (05/18/14 1910)    PRN Medications: sodium chloride, ondansetron (ZOFRAN) IV, sodium chloride, sodium chloride, traZODone   Assessment:  1. A fib RVR now holding NSR on amiodarone.  2. Syncope 3. NSTEMI 4. Acute Respiratory Failure- Intubated.  Suspect primarily pulmonary edema.  5. Acute/Chronic Systolic Heart Failure: EF 25-30%, LV dysfunction has been out of proportion to CAD.  6. Hyperkalemia  7. Early Dementia 8. Hypokalemia 9. CAD 10. ?PNA: on antibiotics.  11. Limited Code: No cpr. No defibrillation.  12. Inadequate oral intake related to limited appetite 13. Hypokalemia  Plan/Discussion:   No evidence for PNA,now off antibiotics. Still having lots of rhonchi throughout, encourage pulmonary toilet.  Remains in NSR, continue amiodarone and Eliquis 2.5 mg BID.  She is on digoxin, will lower to 0.0625 and will get level tomorrow.   She was not able to tolerate coming off milrinone.  She is doing better on milrinone 0.125 mcg/kg/min and will plan on sending her home on this regimen.  She has PICC.  She does not look significantly volume overloaded but still desaturates with ambulation.  She was on torsemide 40 mg bid for about a week at home prior to admission, will increase her torsemide to 40 mg bid.   Concerns for pt going home by herself and PT initially recommended SNF/LTAC. Talked with son extensively and they are willing to pay for aides around the clock. Consulted Electrical engineer to help set this up. Patient will need a walker/rollator as well. She will need home  oxygen.   Continue to work with PT, hopefully will be ready for discharge tomorrow.   Length of Stay: [place] 05/19/2014 10:09 AM

## 2014-05-20 LAB — GLUCOSE, CAPILLARY
Glucose-Capillary: 98 mg/dL (ref 70–99)
Glucose-Capillary: 186 mg/dL — ABNORMAL HIGH (ref 70–99)

## 2014-05-20 LAB — BASIC METABOLIC PANEL
Anion gap: 9 (ref 5–15)
BUN: 25 mg/dL — ABNORMAL HIGH (ref 6–23)
CO2: 32 mEq/L (ref 19–32)
Calcium: 8.9 mg/dL (ref 8.4–10.5)
Chloride: 100 mEq/L (ref 96–112)
Creatinine, Ser: 0.89 mg/dL (ref 0.50–1.10)
GFR calc Af Amer: 65 mL/min — ABNORMAL LOW (ref >90)
GFR calc non Af Amer: 56 mL/min — ABNORMAL LOW (ref >90)
Glucose, Bld: 86 mg/dL (ref 70–99)
Potassium: 4.7 mEq/L (ref 3.7–5.3)
Sodium: 141 mEq/L (ref 137–147)

## 2014-05-20 LAB — CBC
HCT: 33.2 % — ABNORMAL LOW (ref 36.0–46.0)
Hemoglobin: 10.4 g/dL — ABNORMAL LOW (ref 12.0–15.0)
MCH: 27.5 pg (ref 26.0–34.0)
MCHC: 31.3 g/dL (ref 30.0–36.0)
MCV: 87.8 fL (ref 78.0–100.0)
Platelets: 386 10*3/uL (ref 150–400)
RBC: 3.78 MIL/uL — ABNORMAL LOW (ref 3.87–5.11)
RDW: 17.1 % — ABNORMAL HIGH (ref 11.5–15.5)
WBC: 11.1 10*3/uL — ABNORMAL HIGH (ref 4.0–10.5)

## 2014-05-20 LAB — DIGOXIN LEVEL: Digoxin Level: 1.1 ng/mL (ref 0.8–2.0)

## 2014-05-20 MED ORDER — HEPARIN SOD (PORK) LOCK FLUSH 100 UNIT/ML IV SOLN
250.0000 [IU] | INTRAVENOUS | Status: DC | PRN
  Administered 2014-05-20: 250 [IU]
  Filled 2014-05-20: qty 3

## 2014-05-20 MED ORDER — DIGOXIN 0.0625 MG HALF TABLET: 0.0625 mg | ORAL_TABLET | ORAL | Status: DC

## 2014-05-20 MED ORDER — MILRINONE IN DEXTROSE 20 MG/100ML IV SOLN: 0.1250 ug/kg/min | INTRAVENOUS | Status: DC

## 2014-05-20 MED ORDER — HEPARIN SOD (PORK) LOCK FLUSH 100 UNIT/ML IV SOLN
250.0000 [IU] | Freq: Every day | INTRAVENOUS | Status: DC
  Filled 2014-05-20: qty 3

## 2014-05-20 MED ORDER — ENSURE COMPLETE PO LIQD: 237.0000 mL | Freq: Two times a day (BID) | ORAL | Status: DC

## 2014-05-20 MED ORDER — AMIODARONE HCL 200 MG PO TABS: 200.0000 mg | ORAL_TABLET | Freq: Two times a day (BID) | ORAL | Status: DC

## 2014-05-20 MED ORDER — APIXABAN 2.5 MG PO TABS
2.5000 mg | ORAL_TABLET | Freq: Two times a day (BID) | ORAL | Status: DC
Start: 2014-05-20 — End: 2014-06-21

## 2014-05-20 MED ORDER — POTASSIUM CHLORIDE CRYS ER 20 MEQ PO TBCR: 20.0000 meq | EXTENDED_RELEASE_TABLET | Freq: Every day | ORAL | Status: DC

## 2014-05-20 NOTE — Discharge Summary (Signed)
CARDIOLOGY DISCHARGE SUMMARY   Patient ID: Jacqueline Reilly MRN: [id number] DOB/AGE: [birth year] 80 y.o.  Admit date: 05/07/2014 Discharge date: 05/20/2014  PCP: Irven Shelling, MD Primary Cardiologist: Dr. Haroldine Laws  Primary Discharge Diagnosis:  Atrial fibrillation with rapid ventricular response - sinus rhythm at discharge Secondary Discharge Diagnosis:    NSTEMI, initial episode of care   Acute respiratory failure with hypoxia   Acute on chronic systolic CHF, class IV   Hyperkalemia   Hypokalemia   Anemia   Anticoagulation   NSVT   Partial DO NOT RESUSCITATE   Deconditioning  Consults: CCM  Procedures: serial chest x-rays, abdominal x-ray, 2-D echocardiogram, lower extremity venous duplex, intubation and mechanical ventilation, central venous catheter insertion  Hospital Course: Jacqueline Reilly is a 80 y.o. female with a history of CAD, LVD out of proportion to her CAD, and CHF.  She was discharged on 06/30 after an admission for heart failure. She had near syncope on the day of admission and came to the emergency room. She was found to be in atrial fibrillation with rapid ventricular response. She was admitted for further evaluation and treatment.  1. Atrial fibrillation with RVR:  Her blood pressure was borderline and she did not tolerate beta blockers. Cardizem was attempted but her blood pressure did not tolerate this either. She was started on IV amiodarone to help with the atrial fibrillation. On the IV amiodarone, she spontaneously converted to sinus rhythm. However, when her respiratory status deteriorated and she required intubation, she began going in and out of rapid atrial fibrillation. She also had runs of nonsustained VT. She was continued on the IV amiodarone. On 07/19, she was changed to oral amiodarone at 400 mg twice a day and is tolerating this well. She was started on digoxin at a low dose but the level was higher than felt safe, so this was discontinued.  She is currently maintaining sinus rhythm.  2. NSTEMI: she had elevated cardiac enzymes, consistent with a non-STEMI. She was having no chest pain and enzyme elevation was felt secondary to a combination of rapid atrial fibrillation and heart failure. Medical therapy was recommended and she was not felt appropriate for cardiac catheterization. She was treated medically with aspirin and heparin. Her blood pressure would not tolerate a beta blocker and she had some abnormalities in her LFTs, so no statin was used. A 2-D echocardiogram was performed, results below. Her EF is consistent with previous values and no new wall motion abnormalities were noted. She is currently stable from an ischemic standpoint.  3. Acute respiratory failure with hypoxia: she was initially admitted to CCU. After arrival to the floor, she developed increasing shortness of breath associated with increasing work of breathing and JVD. Rapid response was called and the cardiology fellow came to the bedside. She was placed on BiPAP and a blood gas was performed, results below. She was given IV Lasix 80 mg. However, the BiPAP was unsuccessful in reversing her respiratory failure. CCM was contacted and intubated the patient on 07/12. A central venous catheter was also placed. Her respiratory status was managed by CCM plus cardiology. She gradually improved with diuresis. She was hypotensive and required Levophed for a while for pressure support but this was gradually weaned and discontinued by 07/16. The 07/17, her respiratory status had improved to the point that she could be extubated. She did not require reintubation. Blood and respiratory cultures were negative. There was also concern about possible PE/DVT but lower extremity  venous Dopplers were negative. There was concern for pneumonia and she was started on antibiotics but completed these prior to discharge.  4. Acute on chronic systolic CHF, class 4: she has been CHF and her  respiratory failure was felt to have heart failure as a primary cause. She was diuresed as her blood pressure and renal function would allow. She developed hyperkalemia prior to the initiation of diuresis with a potassium a 5.8. She was given one dose of Kayexalate and the hyperkalemia resolved. Her electrolytes and renal function were followed closely during her hospital stay. Her CO-OX was followed and she was placed on milrinone. Her CO-OX improved with the milrinone. Once her general medical condition improved, the milrinone was discontinued. However, her CO-OX dropped and the milrinone was restarted. Her CO-OX decreased from 53% down to 24% of the milrinone. It is now greater than 63% back on the milrinone. Arrangements were made for home milrinone.  5. Anemia: her hemoglobin and hematocrit were followed closely during her hospital stay. Her hemoglobin.as low as 9.6 with a hematocrit as low as 30.4. She had, but little bit at discharge, discharge labs are below. This is felt secondary to procedures and blood draws. Her MCV is normal. This can be followed as an outpatient.  6. Anticoagulation: she was placed initially on heparin. Once her general medical condition stabilized, she was started on Eliquis for systemic anticoagulation. She is currently tolerating this well. However, there are cost considerations with this medication, so she may need to be changed to Coumadin if she cannot afford this.  7. Partial DO NOT RESUSCITATE: As her respiratory status worsens, discussion regarding CODE STATUS was held with the family. The family decided that she was not a candidate for CPR or cardioversion/defibrillation. However, at this time, she is a candidate for intubation and mechanical ventilation as well as medications.  8. Deconditioning: [name] is significantly deconditioned. She was seen by physical therapy and ambulated with cardiac rehabilitation as well. The patient and her family are refusing SNF  placement. 24 hour care and assistance is strongly recommended and the family is agreeing to hiring staff to assist in the home. They were given a list of agencies. Durable medical equipment needed was ordered.  On 07/24, Ms. Mechling was seen by Dr. [name] and all data reviewed. No further inpatient workup is indicated and she is considered stable for discharge, with assistance in the home and early outpatient followup arranged.  Labs:  ABG    Component Value Date/Time   PHART 7.304* 05/08/2014 0507   PCO2ART 35.9 05/08/2014 0507   PO2ART 122.0* 05/08/2014 0507   HCO3 17.3* 05/08/2014 0507   TCO2 18.4 05/08/2014 0507   ACIDBASEDEF 7.9* 05/08/2014 0507   O2SAT 62.7 05/19/2014 0500   Lab Results  Component Value Date   WBC 11.1* 05/20/2014   HGB 10.4* 05/20/2014   HCT 33.2* 05/20/2014   MCV 87.8 05/20/2014   PLT 386 05/20/2014     Recent Labs Lab 05/20/14 0450  NA 141  K 4.7  CL 100  CO2 32  BUN 25*  CREATININE 0.89  CALCIUM 8.9  GLUCOSE 86   Lipid Panel     Component Value Date/Time   CHOL 217* 05/08/2014 0155   TRIG 87 05/08/2014 0155   HDL 69 05/08/2014 0155   CHOLHDL 3.1 05/08/2014 0155   VLDL 17 05/08/2014 0155   LDLCALC 131* 05/08/2014 0155    Pro B Natriuretic peptide (BNP)  Date/Time Value Ref Range Status  05/15/2014 10:10 AM 7409.0* 0 - 450 pg/mL Final  05/09/2014  4:30 AM 23926.0* 0 - 450 pg/mL Final   Lab Results  Component Value Date   DIGOXIN 1.1 05/20/2014   Troponin I  Date Value Ref Range Status  05/08/2014 2.18* <0.30 ng/mL Final  05/07/2014 1.54* <0.30 ng/mL Final  05/07/2014 1.59* <0.30 ng/mL Final      Radiology: Dg Chest Port 1 View 05/14/2014   CLINICAL DATA:  Pneumonia. Acute respiratory failure with hypoxia. Acute myocardial infarct. Atrial fibrillation.  EXAM: PORTABLE CHEST - 1 VIEW  COMPARISON:  05/12/2014  FINDINGS: The endotracheal tube and nasogastric tube have been removed. Right jugular central venous catheter remains in appropriate position.  Mild  cardiomegaly stable. Diffuse interstitial edema pattern shows no significant change. No focal consolidation or pleural effusion demonstrated on this portable exam.  IMPRESSION: No significant change in cardiomegaly and diffuse interstitial edema pattern.   Electronically Signed   By: Earle Gell M.D.   On: 05/14/2014 11:37   Dg Chest Port 1 View 05/12/2014   CLINICAL DATA:  Acute respiratory failure.  Ventilator.  EXAM: PORTABLE CHEST - 1 VIEW  COMPARISON:  05/10/2014  FINDINGS: Endotracheal tube, central line, and NG tube appear in good position. There has been further clearing of the interstitial edema superimposed on chronic lung disease. Cardiomegaly process. Pulmonary vascularity has improved. There is still slight prominence of the right pulmonary arteries.  IMPRESSION: Further improvement in interstitial edema superimposed on chronic lung disease.   Electronically Signed   By: Rozetta Nunnery M.D.   On: 05/12/2014 07:56   Dg Chest Port 1 View 05/10/2014   CLINICAL DATA:  Follow-up pulmonary edema.  EXAM: PORTABLE CHEST - 1 VIEW  COMPARISON:  05/09/2014.  FINDINGS: Endotracheal tube tip 3.6 cm above the carina.  Nasogastric tube courses below the diaphragm. Tip is not included on the present exam.  Right central line tip distal superior vena cava/ cavoatrial junction level.  Cardiomegaly.  Calcified tortuous aorta.  Pulmonary edema similar to the prior exam.  No segmental consolidation. Evaluation left lung base limited by cardiomegaly.  Shoulder joint degenerative changes. Possible bone infarct proximal left humerus.  IMPRESSION: No significant change in degree of pulmonary edema. Please see above.   Electronically Signed   By: Chauncey Cruel M.D.   On: 05/10/2014 08:17   Dg Chest Port 1 View 05/09/2014   CLINICAL DATA:  Respiratory failure.  EXAM: PORTABLE CHEST - 1 VIEW  COMPARISON:  05/08/2014  FINDINGS: Endotracheal tube tip is approximately 2.5 cm above the carina. There has been interval placement of a  gastric decompression tube with the tip extending into the stomach. Lungs showed interval worsening of pulmonary edema. There is stable cardiomegaly. No significant pleural fluid identified. No pneumothorax.  IMPRESSION: Worsening of pulmonary edema.   Electronically Signed   By: Aletta Edouard M.D.   On: 05/09/2014 07:39   Dg Chest Port 1 View 05/08/2014   CLINICAL DATA:  Endotracheal tube and central line placement.  EXAM: PORTABLE CHEST - 1 VIEW  COMPARISON:  05/07/2014  FINDINGS: Endotracheal tube placed with tip measuring 3.3 cm above the carina. Right central venous catheter placed with tip over the cavoatrial junction. No pneumothorax. Cardiac enlargement with pulmonary vascular congestion and diffuse perihilar opacities consistent with edema. Edema pattern is progressing since previous study. No blunting of costophrenic angles. No pneumothorax. Degenerative changes in the shoulders.  IMPRESSION: Appliances appear to be in satisfactory location. Cardiac enlargement with pulmonary vascular congestion  and progressive edema since previous study.   Electronically Signed   By: Lucienne Capers M.D.   On: 05/08/2014 01:34   Dg Chest Port 1 View 05/07/2014   CLINICAL DATA:  Syncopal episode  EXAM: PORTABLE CHEST - 1 VIEW  COMPARISON:  04/23/2014  FINDINGS: Cardiac shadow remains enlarged. Diffuse fibrotic changes are again identified. No sizable effusion or focal infiltrate is noted. Chronic changes about shoulder joints are seen.  IMPRESSION: Chronic changes without acute abnormality.   Electronically Signed   By: Inez Catalina M.D.   On: 05/07/2014 10:04   Dg Abd Portable 1v 05/08/2014   CLINICAL DATA:  OG tube placement  EXAM: PORTABLE ABDOMEN - 1 VIEW  COMPARISON:  None.  FINDINGS: Enteric tube tip is localized to the left lower abdomen consistent with location in the distal stomach. Visualized bowel gas pattern is unremarkable. Cardiac enlargement with probable interstitial infiltration in the lungs.   IMPRESSION: Enteric tube tip is in the left abdomen consistent with location in the distal stomach.   Electronically Signed   By: Lucienne Capers M.D.   On: 05/08/2014 05:38   EKG: sinus rhythm with PVCs, rate 72  Echo: 05/08/2014 - Left ventricle: The cavity size was severely dilated. Wall thickness was normal. Systolic function was severely reduced. The estimated ejection fraction was in the range of 20% to 25%. Diffuse hypokinesis. - Aortic valve: There was mild regurgitation. - Mitral valve: Calcified annulus. Mildly thickened leaflets . There was mild regurgitation. - Left atrium: The atrium was moderately dilated. - Right atrium: Chiari network seen. - Atrial septum: No defect or patent foramen ovale was identified. - Pulmonary arteries: PA peak pressure: 38 mm Hg (S).    FOLLOW UP PLANS AND APPOINTMENTS Allergies  Allergen Reactions  . Codeine Nausea Only  . Garlic Nausea Only     Medication List    STOP taking these medications       lisinopril 5 MG tablet  Commonly known as:  PRINIVIL,ZESTRIL     metoprolol succinate 25 MG 24 hr tablet  Commonly known as:  TOPROL-XL      TAKE these medications       acetaminophen 500 MG tablet  Commonly known as:  TYLENOL  Take 500 mg by mouth every 6 (six) hours as needed (for pain).     amiodarone 200 MG tablet  Commonly known as:  PACERONE  Take 1 tablet (200 mg total) by mouth 2 (two) times daily.     apixaban 2.5 MG Tabs tablet  Commonly known as:  ELIQUIS  Take 1 tablet (2.5 mg total) by mouth 2 (two) times daily.     aspirin 81 MG chewable tablet  Chew 81 mg by mouth daily.     feeding supplement (ENSURE COMPLETE) Liqd  Take 237 mLs by mouth 2 (two) times daily between meals.     Fish Oil 1200 MG Caps  Take 1 capsule by mouth daily.     milrinone 20 MG/100ML Soln infusion  Commonly known as:  PRIMACOR  Inject 4.95 mcg/min into the vein continuous. Per [facility]     multivitamin tablet  Take 1  tablet by mouth daily.     nitroGLYCERIN 0.4 MG SL tablet  Commonly known as:  NITROSTAT  Place 0.4 mg under the tongue every 5 (five) minutes as needed for chest pain.     potassium chloride SA 20 MEQ tablet  Commonly known as:  K-DUR,KLOR-CON  Take 1 tablet (20 mEq total)  by mouth daily.     torsemide 20 MG tablet  Commonly known as:  DEMADEX  Take 40 mg by mouth 2 (two) times daily.        Discharge Instructions   (HEART FAILURE PATIENTS) Call MD:  Anytime you have any of the following symptoms: 1) 3 pound weight gain in 24 hours or 5 pounds in 1 week 2) shortness of breath, with or without a dry hacking cough 3) swelling in the hands, feet or stomach 4) if you have to sleep on extra pillows at night in order to breathe.    Complete by:  As directed      Avoid straining    Complete by:  As directed      Contraindication beta blocker-discharge    Complete by:  As directed      Contraindication to ARB at discharge    Complete by:  As directed      Diet - low sodium heart healthy    Complete by:  As directed      Diet Carb Modified    Complete by:  As directed      Heart Failure patients record your daily weight using the same scale at the same time of day    Complete by:  As directed      Increase activity slowly    Complete by:  As directed      STOP any activity that causes chest pain, shortness of breath, dizziness, sweating, or exessive weakness    Complete by:  As directed           Follow-up Information   Follow up with Glori Bickers, MD. (the office will call.)    Specialty:  Cardiology   Contact information:   [place] [address] [phone]       BRING ALL MEDICATIONS WITH YOU TO FOLLOW UP APPOINTMENTS  Time spent with patient to include physician time: 55 min Signed: Rosaria Ferries, PA-C 05/20/2014, 12:16 PM Co-Sign MD

## 2014-05-20 NOTE — Progress Notes (Signed)
Patient ID: Jacqueline Reilly, female   DOB: 1924-03-09, 80 y.o.   MRN: [id number] Advanced Heart Failure Rounding Note   Subjective:   Jacqueline Reilly) is an 80 y/o woman with AAA, systolic HF EF [id number], dementia, and CAD with two previous coronary interventions including a cutting balloon to her D2 in 2007 and a DES to her mid LCX in 2010. Her LV dysfunction has been out of proportion to her CAD.    Admitted 05/07/14 with syncope and dyspnea. She had new onset A-fib RVR. Troponin was 1.03>  1.59> 2.18  .   She was placed on amiodarone and heparin drip. She developed respiratory distress and required intubation. She converted to NSR but later was bradycardiac with NSVT. Amiodarone temporarily stopped but restarted after hyperkalemia resolved. Hyperkalemia was thought to be contributing to bradycardia.  Dopamine later added for hypotension. Diuresed with IV lasix and extubated.    She was started on Milrinone 7/14 due to CO-OX 29%.  Levo added for low BP then weaned off.  She was taken off milrinone yesterday but felt much worse and co-ox was found to be down to 29%.  She was put back on milrinone, co-ox 63% when rechecked.  She continues to desaturate with ambulation. She did well ambulating yesterday.  Family wants home with 24 hour care.    CO-OX 69%> 29%>43%>milrinone begun>63>65>64%>55%>53%>24% off milrinone >63% back on milrinone   Objective:   Weight Range:  Vital Signs:   Temp:  [97.8 F (36.6 C)-97.9 F (36.6 C)] 97.9 F (36.6 C) (07/24 0424) Pulse Rate:  [64-80] 64 (07/24 0424) Resp:  [18] 18 (07/24 0424) BP: (103-105)/(66-71) 103/66 mmHg (07/24 0424) SpO2:  [92 %-97 %] 92 % (07/24 0424) Weight:  [87 lb 8.4 oz (39.7 kg)] 87 lb 8.4 oz (39.7 kg) (07/24 0424) Last BM Date: 05/18/14  Weight change: Filed Weights   05/18/14 0555 05/19/14 0418 05/20/14 0424  Weight: 87 lb 3.2 oz (39.554 kg) 88 lb 6.5 oz (40.1 kg) 87 lb 8.4 oz (39.7 kg)    Intake/Output:   Intake/Output Summary  (Last 24 hours) at 05/20/14 0924 Last data filed at 05/20/14 0843  Gross per 24 hour  Intake    600 ml  Output      0 ml  Net    600 ml     Physical Exam General:  NAD  HEENT: normal Neck: supple. JVP not elevated. Carotids 2+ bilat; no bruits. No lymphadenopathy or thryomegaly appreciated. Cor: PMI nondisplaced. Regular rate & rhythm. No rubs, gallops or murmurs. Lungs: Crackles at bases bilaterally with some rhonchi.   Abdomen: soft, nontender, nondistended. No hepatosplenomegaly. No bruits or masses. Good bowel sounds. Extremities:  No cyanosis, clubbing, rash, edema Neuro: A&O x 3.   Telemetry: SR 70s  Labs: Basic Metabolic Panel:  Recent Labs Lab 05/15/14 1010 05/17/14 0345 05/18/14 0635 05/19/14 0500 05/20/14 0450  NA 137 139 140 138 141  K 3.6* 3.4* 4.1 4.6 4.7  CL 88* 92* 96 98 100  CO2 36* 34* 35* 34* 32  GLUCOSE 157* 105* 93 86 86  BUN 39* 26* 25* 29* 25*  CREATININE 0.85 0.79 0.82 0.91 0.89  CALCIUM 8.9 8.9 8.8 8.6 8.9    Liver Function Tests: No results found for this basename: AST, ALT, ALKPHOS, BILITOT, PROT, ALBUMIN,  in the last 168 hours No results found for this basename: LIPASE, AMYLASE,  in the last 168 hours No results found for this basename: AMMONIA,  in the last  168 hours  CBC:  Recent Labs Lab 05/16/14 0400 05/17/14 0345 05/18/14 0635 05/19/14 0500 05/20/14 0450  WBC 9.9 10.4 9.2 8.8 11.1*  HGB 10.0* 9.8* 10.0* 9.6* 10.4*  HCT 31.4* 30.6* 31.5* 30.4* 33.2*  MCV 83.7 84.5 86.5 87.6 87.8  PLT 252 281 313 311 386    Cardiac Enzymes: No results found for this basename: CKTOTAL, CKMB, CKMBINDEX, TROPONINI,  in the last 168 hours  BNP: BNP (last 3 results)  Recent Labs  05/07/14 0920 05/09/14 0430 05/15/14 1010  PROBNP 15676.0* 23926.0* 7409.0*     Other results:  :   Imaging: No results found.   Medications:     Scheduled Medications: . amiodarone  400 mg Oral BID  . antiseptic oral rinse  15 mL Mouth Rinse  BID  . apixaban  2.5 mg Oral BID  . feeding supplement (ENSURE COMPLETE)  237 mL Oral BID BM  . insulin aspart  0-15 Units Subcutaneous TID WC  . insulin aspart  0-5 Units Subcutaneous QHS  . multivitamin with minerals  1 tablet Oral Daily  . potassium chloride  20 mEq Oral BID  . sodium chloride  10-40 mL Intracatheter Q12H  . torsemide  40 mg Oral BID    Infusions: . sodium chloride 10 mL/hr at 05/16/14 2100  . milrinone 0.125 mcg/kg/min (05/18/14 1910)    PRN Medications: sodium chloride, ondansetron (ZOFRAN) IV, sodium chloride, sodium chloride, traZODone   Assessment:  1. A fib RVR now holding NSR on amiodarone.  2. Syncope 3. NSTEMI 4. Acute Respiratory Failure- Intubated.  Suspect primarily pulmonary edema.  5. Acute/Chronic Systolic Heart Failure: EF 25-30%, LV dysfunction has been out of proportion to CAD.  6. Hyperkalemia  7. Early Dementia 8. Hypokalemia 9. CAD 10. ?PNA: on antibiotics.  11. Limited Code: No cpr. No defibrillation.  12. Inadequate oral intake related to limited appetite 13. Hypokalemia  Plan/Discussion:   No evidence for PNA,now off antibiotics.   Remains in NSR, continue amiodarone and Eliquis 2.5 mg BID.  Digoxin level was 1.1 today, think we will stop this given her size as well as use of milrinone.   She was not able to tolerate coming off milrinone.  She is doing better on milrinone 0.125 mcg/kg/min and will plan on sending her home on this regimen.  She has PICC.  She does not look significantly volume overloaded but still desaturates with ambulation.  She was on torsemide 40 mg bid for about a week at home prior to admission, so I have increased her torsemide to 40 mg bid.   Concerns for pt going home by herself and PT initially recommended SNF/LTAC. Talked with son extensively and they are willing to pay for aides around the clock. Consulted Electrical engineer to help set this up. Patient will need a walker/rollator as well. She  will need home oxygen.   Patient may go home today if everything is in place for home care.  She will need followup in CHF clinic next week with [facility].  Home meds:  Milrinone 0.125 mcg/kg/min Torsemide 40 mg bid KCl 20 daily Amiodarone 200 mg bid apixaban 2.5 mg bid Home diabetes meds  Length of Stay: [place] 05/20/2014 9:24 AM

## 2014-05-20 NOTE — Progress Notes (Signed)
Discharge instructions and med reconc reviewed with son and multiple family members, son verbalizes understanding via teachback, monitor removed per protocol, DL PICC in left arm with continuous Milrinone drip infusing per [facility], other port  Flushed by IV team, reiterated CHF instructions as written on discharge instructions including weight management, son verbalizes via teachback and seems very aware of teachings from personal experiences, Hazle Nordmann RN

## 2014-05-21 DIAGNOSIS — I2510 Atherosclerotic heart disease of native coronary artery without angina pectoris: Secondary | ICD-10-CM | POA: Diagnosis not present

## 2014-05-21 DIAGNOSIS — I5043 Acute on chronic combined systolic (congestive) and diastolic (congestive) heart failure: Secondary | ICD-10-CM | POA: Diagnosis not present

## 2014-05-21 DIAGNOSIS — I714 Abdominal aortic aneurysm, without rupture, unspecified: Secondary | ICD-10-CM | POA: Diagnosis not present

## 2014-05-21 DIAGNOSIS — I10 Essential (primary) hypertension: Secondary | ICD-10-CM | POA: Diagnosis not present

## 2014-05-21 DIAGNOSIS — I509 Heart failure, unspecified: Secondary | ICD-10-CM | POA: Diagnosis not present

## 2014-05-23 DIAGNOSIS — I2510 Atherosclerotic heart disease of native coronary artery without angina pectoris: Secondary | ICD-10-CM | POA: Diagnosis not present

## 2014-05-23 DIAGNOSIS — I5043 Acute on chronic combined systolic (congestive) and diastolic (congestive) heart failure: Secondary | ICD-10-CM | POA: Diagnosis not present

## 2014-05-23 DIAGNOSIS — I714 Abdominal aortic aneurysm, without rupture, unspecified: Secondary | ICD-10-CM | POA: Diagnosis not present

## 2014-05-23 DIAGNOSIS — I10 Essential (primary) hypertension: Secondary | ICD-10-CM | POA: Diagnosis not present

## 2014-05-23 DIAGNOSIS — I509 Heart failure, unspecified: Secondary | ICD-10-CM | POA: Diagnosis not present

## 2014-05-25 DIAGNOSIS — I2510 Atherosclerotic heart disease of native coronary artery without angina pectoris: Secondary | ICD-10-CM | POA: Diagnosis not present

## 2014-05-25 DIAGNOSIS — I509 Heart failure, unspecified: Secondary | ICD-10-CM | POA: Diagnosis not present

## 2014-05-25 DIAGNOSIS — I5043 Acute on chronic combined systolic (congestive) and diastolic (congestive) heart failure: Secondary | ICD-10-CM | POA: Diagnosis not present

## 2014-05-25 DIAGNOSIS — I10 Essential (primary) hypertension: Secondary | ICD-10-CM | POA: Diagnosis not present

## 2014-05-25 DIAGNOSIS — I714 Abdominal aortic aneurysm, without rupture, unspecified: Secondary | ICD-10-CM | POA: Diagnosis not present

## 2014-05-26 ENCOUNTER — Encounter (HOSPITAL_COMMUNITY): Payer: Self-pay

## 2014-05-26 ENCOUNTER — Ambulatory Visit (HOSPITAL_COMMUNITY)
Admission: RE | Admit: 2014-05-26 | Discharge: 2014-05-26 | Disposition: A | Discharge status: Home / Self Care | Payer: Medicare Other | Source: Ambulatory Visit | Attending: Cardiology | Admitting: Cardiology

## 2014-05-26 ENCOUNTER — Encounter (INDEPENDENT_AMBULATORY_CARE_PROVIDER_SITE_OTHER): Payer: Self-pay

## 2014-05-26 VITALS — BP 108/58 | HR 65 | Resp 18 | Wt 81.2 lb

## 2014-05-26 DIAGNOSIS — I509 Heart failure, unspecified: Secondary | ICD-10-CM | POA: Diagnosis not present

## 2014-05-26 DIAGNOSIS — I10 Essential (primary) hypertension: Secondary | ICD-10-CM | POA: Diagnosis not present

## 2014-05-26 DIAGNOSIS — I2510 Atherosclerotic heart disease of native coronary artery without angina pectoris: Secondary | ICD-10-CM | POA: Insufficient documentation

## 2014-05-26 DIAGNOSIS — I5022 Chronic systolic (congestive) heart failure: Secondary | ICD-10-CM

## 2014-05-26 DIAGNOSIS — Z8249 Family history of ischemic heart disease and other diseases of the circulatory system: Secondary | ICD-10-CM | POA: Diagnosis not present

## 2014-05-26 DIAGNOSIS — R5381 Other malaise: Secondary | ICD-10-CM | POA: Insufficient documentation

## 2014-05-26 DIAGNOSIS — I4891 Unspecified atrial fibrillation: Secondary | ICD-10-CM | POA: Diagnosis not present

## 2014-05-26 DIAGNOSIS — Z9981 Dependence on supplemental oxygen: Secondary | ICD-10-CM | POA: Diagnosis not present

## 2014-05-26 HISTORY — DX: Chronic systolic (congestive) heart failure: I50.22

## 2014-05-26 HISTORY — DX: Unspecified atrial fibrillation: I48.91

## 2014-05-26 MED ORDER — TORSEMIDE 20 MG PO TABS: 40.0000 mg | ORAL_TABLET | Freq: Two times a day (BID) | ORAL | Status: DC

## 2014-05-26 NOTE — Progress Notes (Signed)
Patient ID: Jacqueline Reilly, female   DOB: Oct 22, 1924, 80 y.o.   MRN: [id number] PCP: Primary Cardiologist:  HPI: Jacqueline Reilly) is an 80 y/o woman with CAD, AAA, systolic HF, dementia, afib and CAD. She has had two previous coronary interventions including a cutting balloon to her D2 in 2007 and a DES to her mid LCX in 2010. Her LV dysfunction has been out of proportion to her CAD.   Admitted 7/11-7/24/15 with syncope and dyspnea. She had new onset A-fib RVR. Troponin was 1.03> 1.59> 2.18 . She was placed on amiodarone and heparin drip. She developed respiratory distress and required intubation. She converted to NSR but later was bradycardiac with NSVT. Amiodarone temporarily stopped but restarted after hyperkalemia resolved. Hyperkalemia was thought to be contributing to bradycardia. Diuresed with IV lasix. Co-ox 29% and started on milrinone which we tried to wean off but she did not tolerate. Discharge weight 87 lbs.   [facility] Follow up for Heart Failure: Doing well. Denies SOB, PND, edema or CP. +orthopnea. Weight stable at home 80-81 lbs at home. [place] 2L oxygen 24/7. HHRN and PT working with her. Very minimal activity. Following a low salt diet and drinking less than 2L a day.   Labs: (05/23/14) K+ 4.3, creatinine 1.06  ROS: All systems negative except as listed in HPI, PMH and Problem List.  SH:  History   Social History  . Marital Status: Married    Spouse Name: N/A    Number of Children: 1  . Years of Education: N/A   Occupational History  . retired    Social History Main Topics  . Smoking status: Never Smoker   . Smokeless tobacco: Never Used  . Alcohol Use: Yes     Comment: occasionally  . Drug Use: No  . Sexual Activity: Not on file   Other Topics Concern  . Not on file   Social History Narrative   She lives in [place], family helps with her care.    FH:  Family History  Problem Relation Age of Onset  . Heart disease Mother   . Heart disease  Father   . Heart disease Brother     x 3    Past Medical History  Diagnosis Date  . PVD (peripheral vascular disease)   . Ventricular dysfunction     left; ischemic  . Hyperlipidemia   . AAA (abdominal aortic aneurysm)   . Renal artery stenosis   . Hypertension   . Coronary artery disease 2007    moderate ASCAD of the left system s/p PCI of the D2 and PCI of the left circ 03/2009  . Chronic systolic heart failure     a. ECHO (04/2014) EF 20-25%, diff HK, mild MR  . Atrial fibrillation     Current Outpatient Prescriptions  Medication Sig Dispense Refill  . acetaminophen (TYLENOL) 500 MG tablet Take 500 mg by mouth every 6 (six) hours as needed (for pain).       Marland Kitchen amiodarone (PACERONE) 200 MG tablet Take 1 tablet (200 mg total) by mouth 2 (two) times daily.  60 tablet  6  . apixaban (ELIQUIS) 2.5 MG TABS tablet Take 1 tablet (2.5 mg total) by mouth 2 (two) times daily.  60 tablet  0  . aspirin 81 MG chewable tablet Chew 81 mg by mouth daily.      . feeding supplement, ENSURE COMPLETE, (ENSURE COMPLETE) LIQD Take 237 mLs by mouth 2 (two) times daily between meals.      Marland Kitchen  milrinone (PRIMACOR) 20 MG/100ML SOLN infusion Inject 4.95 mcg/min into the vein continuous. Per [facility]  100 mL  3  . Multiple Vitamin (MULTIVITAMIN) tablet Take 1 tablet by mouth daily.      . nitroGLYCERIN (NITROSTAT) 0.4 MG SL tablet Place 0.4 mg under the tongue every 5 (five) minutes as needed for chest pain.      . Omega-3 Fatty Acids (FISH OIL) 1200 MG CAPS Take 1 capsule by mouth daily.      . potassium chloride SA (K-DUR,KLOR-CON) 20 MEQ tablet Take 1 tablet (20 mEq total) by mouth daily.  30 tablet  6  . torsemide (DEMADEX) 20 MG tablet Take 2 tablets (40 mg total) by mouth 2 (two) times daily.  120 tablet  3   No current facility-administered medications for this encounter.    Filed Vitals:   05/26/14 0955  BP: 108/58  Pulse: 65  Resp: 18  Weight: 81 lb 4 oz (36.855 kg)  SpO2: 91%     PHYSICAL EXAM:  General:  Elderly appearing. Frail, No resp difficulty, in wheelchair; family present HEENT: normal Neck: supple. JVP flat. Carotids 2+ bilaterally; no bruits. No lymphadenopathy or thryomegaly appreciated. Cor: PMI normal. Regular rate & rhythm. No rubs, gallops or murmurs. Lungs: clear Abdomen: soft, nontender, nondistended. No hepatosplenomegaly. No bruits or masses. Good bowel sounds. Extremities: no cyanosis, clubbing, rash, edema Neuro: alert & orientedx3, cranial nerves grossly intact. Moves all 4 extremities w/o difficulty. Affect pleasant.   ASSESSMENT & PLAN:  1) Chronic Systolic Heart Failure: Mixed ICM/NICM, EF 25-30% (04/2014) - Reviewed discharge summary and patient recently discharged from the hospital for A/C HF secondary to Afib with RVR. She was placed on milrinone for low output and discharge weight was 87 lbs. - NYHA III symptoms and volume status stable. Weight is down 5 lbs since discharge. Will continue torsemide 40 mg BID.  - Continue milrinone 0.125 mcg through PICC. - Not on BB with low output. - SBP soft will not add ACE-I currently. - Reinforced the need and importance of daily weights, a low sodium diet, and fluid restriction (less than 2 L a day). Instructed to call the HF clinic if weight increases more than 3 lbs overnight or 5 lbs in a week.  2) Afib - Appears to be in NSR today. Will continue 200 mg BID and Eliquis 2.5 mg BID (weight less than 60 kg and age >22). 3) Deconditioning - Encouraged to continue to try and be as active as possible. Discussed the need to use walker whenever she is walking. Continue PT.  4) Limited Code - No CPR or defibrillation 5) CAD - No s/s of ischemia. Continue ASA. Consider adding statin next visit but not sure how important this is currently with her HF.    F/U 3 weeks Rande Brunt 12:23 PM

## 2014-05-26 NOTE — Patient Instructions (Addendum)
Doing great.  Continue to try and be as active as possible.  Use your walker all the time.  Try to eat as much protein as possible such as beans, peanut butter and protein yogurt.  F/U 3 weeks with doctor.   Do the following things EVERYDAY: 1) Weigh yourself in the morning before breakfast. Write it down and keep it in a log. 2) Take your medicines as prescribed 3) Eat low salt foods-Limit salt (sodium) to 2000 mg per day.  4) Stay as active as you can everyday 5) Limit all fluids for the day to less than 2 liters 6)

## 2014-05-27 DIAGNOSIS — I714 Abdominal aortic aneurysm, without rupture, unspecified: Secondary | ICD-10-CM | POA: Diagnosis not present

## 2014-05-27 DIAGNOSIS — I2510 Atherosclerotic heart disease of native coronary artery without angina pectoris: Secondary | ICD-10-CM | POA: Diagnosis not present

## 2014-05-27 DIAGNOSIS — I10 Essential (primary) hypertension: Secondary | ICD-10-CM | POA: Diagnosis not present

## 2014-05-27 DIAGNOSIS — I509 Heart failure, unspecified: Secondary | ICD-10-CM | POA: Diagnosis not present

## 2014-05-27 DIAGNOSIS — I5043 Acute on chronic combined systolic (congestive) and diastolic (congestive) heart failure: Secondary | ICD-10-CM | POA: Diagnosis not present

## 2014-05-30 ENCOUNTER — Telehealth (HOSPITAL_COMMUNITY): Payer: Self-pay | Admitting: Vascular Surgery

## 2014-05-30 DIAGNOSIS — I10 Essential (primary) hypertension: Secondary | ICD-10-CM | POA: Diagnosis not present

## 2014-05-30 DIAGNOSIS — I2510 Atherosclerotic heart disease of native coronary artery without angina pectoris: Secondary | ICD-10-CM | POA: Diagnosis not present

## 2014-05-30 DIAGNOSIS — I509 Heart failure, unspecified: Secondary | ICD-10-CM | POA: Diagnosis not present

## 2014-05-30 DIAGNOSIS — I5043 Acute on chronic combined systolic (congestive) and diastolic (congestive) heart failure: Secondary | ICD-10-CM | POA: Diagnosis not present

## 2014-05-30 DIAGNOSIS — I714 Abdominal aortic aneurysm, without rupture, unspecified: Secondary | ICD-10-CM | POA: Diagnosis not present

## 2014-05-30 NOTE — Telephone Encounter (Signed)
Nurse from [facility] left message ... Verbal order for a home health aid pt son is having trouble with bathing and taking care of pt during the day. Please advise

## 2014-05-30 NOTE — Telephone Encounter (Signed)
Lakasha's correct # is [id number], left mess on ID VM ok for aid, call back if anything else is needed

## 2014-05-31 DIAGNOSIS — I714 Abdominal aortic aneurysm, without rupture, unspecified: Secondary | ICD-10-CM | POA: Diagnosis not present

## 2014-05-31 DIAGNOSIS — I10 Essential (primary) hypertension: Secondary | ICD-10-CM | POA: Diagnosis not present

## 2014-05-31 DIAGNOSIS — I5043 Acute on chronic combined systolic (congestive) and diastolic (congestive) heart failure: Secondary | ICD-10-CM | POA: Diagnosis not present

## 2014-05-31 DIAGNOSIS — I509 Heart failure, unspecified: Secondary | ICD-10-CM | POA: Diagnosis not present

## 2014-05-31 DIAGNOSIS — I2510 Atherosclerotic heart disease of native coronary artery without angina pectoris: Secondary | ICD-10-CM | POA: Diagnosis not present

## 2014-06-02 DIAGNOSIS — I2510 Atherosclerotic heart disease of native coronary artery without angina pectoris: Secondary | ICD-10-CM | POA: Diagnosis not present

## 2014-06-02 DIAGNOSIS — I714 Abdominal aortic aneurysm, without rupture, unspecified: Secondary | ICD-10-CM | POA: Diagnosis not present

## 2014-06-02 DIAGNOSIS — I5043 Acute on chronic combined systolic (congestive) and diastolic (congestive) heart failure: Secondary | ICD-10-CM | POA: Diagnosis not present

## 2014-06-02 DIAGNOSIS — I509 Heart failure, unspecified: Secondary | ICD-10-CM | POA: Diagnosis not present

## 2014-06-02 DIAGNOSIS — I10 Essential (primary) hypertension: Secondary | ICD-10-CM | POA: Diagnosis not present

## 2014-06-03 DIAGNOSIS — I714 Abdominal aortic aneurysm, without rupture, unspecified: Secondary | ICD-10-CM | POA: Diagnosis not present

## 2014-06-03 DIAGNOSIS — I2510 Atherosclerotic heart disease of native coronary artery without angina pectoris: Secondary | ICD-10-CM | POA: Diagnosis not present

## 2014-06-03 DIAGNOSIS — I10 Essential (primary) hypertension: Secondary | ICD-10-CM | POA: Diagnosis not present

## 2014-06-03 DIAGNOSIS — I5043 Acute on chronic combined systolic (congestive) and diastolic (congestive) heart failure: Secondary | ICD-10-CM | POA: Diagnosis not present

## 2014-06-03 DIAGNOSIS — I509 Heart failure, unspecified: Secondary | ICD-10-CM | POA: Diagnosis not present

## 2014-06-06 DIAGNOSIS — I10 Essential (primary) hypertension: Secondary | ICD-10-CM | POA: Diagnosis not present

## 2014-06-06 DIAGNOSIS — I509 Heart failure, unspecified: Secondary | ICD-10-CM | POA: Diagnosis not present

## 2014-06-06 DIAGNOSIS — I5043 Acute on chronic combined systolic (congestive) and diastolic (congestive) heart failure: Secondary | ICD-10-CM | POA: Diagnosis not present

## 2014-06-06 DIAGNOSIS — I714 Abdominal aortic aneurysm, without rupture, unspecified: Secondary | ICD-10-CM | POA: Diagnosis not present

## 2014-06-06 DIAGNOSIS — I2510 Atherosclerotic heart disease of native coronary artery without angina pectoris: Secondary | ICD-10-CM | POA: Diagnosis not present

## 2014-06-07 ENCOUNTER — Telehealth (HOSPITAL_COMMUNITY): Payer: Self-pay | Admitting: Vascular Surgery

## 2014-06-07 DIAGNOSIS — I10 Essential (primary) hypertension: Secondary | ICD-10-CM | POA: Diagnosis not present

## 2014-06-07 DIAGNOSIS — I5043 Acute on chronic combined systolic (congestive) and diastolic (congestive) heart failure: Secondary | ICD-10-CM | POA: Diagnosis not present

## 2014-06-07 DIAGNOSIS — I714 Abdominal aortic aneurysm, without rupture, unspecified: Secondary | ICD-10-CM | POA: Diagnosis not present

## 2014-06-07 DIAGNOSIS — I509 Heart failure, unspecified: Secondary | ICD-10-CM | POA: Diagnosis not present

## 2014-06-07 DIAGNOSIS — I2510 Atherosclerotic heart disease of native coronary artery without angina pectoris: Secondary | ICD-10-CM | POA: Diagnosis not present

## 2014-06-07 NOTE — Telephone Encounter (Signed)
Son called pt is having anxiety.. Son will like for pt to have some medication

## 2014-06-08 DIAGNOSIS — I509 Heart failure, unspecified: Secondary | ICD-10-CM | POA: Diagnosis not present

## 2014-06-08 DIAGNOSIS — I5043 Acute on chronic combined systolic (congestive) and diastolic (congestive) heart failure: Secondary | ICD-10-CM | POA: Diagnosis not present

## 2014-06-08 DIAGNOSIS — I714 Abdominal aortic aneurysm, without rupture, unspecified: Secondary | ICD-10-CM | POA: Diagnosis not present

## 2014-06-08 DIAGNOSIS — I10 Essential (primary) hypertension: Secondary | ICD-10-CM | POA: Diagnosis not present

## 2014-06-08 DIAGNOSIS — I2510 Atherosclerotic heart disease of native coronary artery without angina pectoris: Secondary | ICD-10-CM | POA: Diagnosis not present

## 2014-06-10 ENCOUNTER — Telehealth (HOSPITAL_COMMUNITY): Payer: Self-pay | Admitting: Vascular Surgery

## 2014-06-10 DIAGNOSIS — I10 Essential (primary) hypertension: Secondary | ICD-10-CM | POA: Diagnosis not present

## 2014-06-10 DIAGNOSIS — I5043 Acute on chronic combined systolic (congestive) and diastolic (congestive) heart failure: Secondary | ICD-10-CM | POA: Diagnosis not present

## 2014-06-10 DIAGNOSIS — I2510 Atherosclerotic heart disease of native coronary artery without angina pectoris: Secondary | ICD-10-CM | POA: Diagnosis not present

## 2014-06-10 DIAGNOSIS — I714 Abdominal aortic aneurysm, without rupture, unspecified: Secondary | ICD-10-CM | POA: Diagnosis not present

## 2014-06-10 DIAGNOSIS — I509 Heart failure, unspecified: Secondary | ICD-10-CM | POA: Diagnosis not present

## 2014-06-10 NOTE — Telephone Encounter (Signed)
Spoke w/RN she states pt's son had saved a little bit of sputum pt had coughed up with some blood streaks in it, she states lungs are clear, wt is stable this is happening just once in a while, she feels it may be related to oxygen use and has ordered pt distilled water for oxygen she will continue to monitor

## 2014-06-10 NOTE — Telephone Encounter (Signed)
Bloody sputum... Nurse lungs are clear.. Does not think it respiratory .Marland Kitchen Pt on 02.. Please advise

## 2014-06-11 DIAGNOSIS — I714 Abdominal aortic aneurysm, without rupture, unspecified: Secondary | ICD-10-CM | POA: Diagnosis not present

## 2014-06-11 DIAGNOSIS — I5043 Acute on chronic combined systolic (congestive) and diastolic (congestive) heart failure: Secondary | ICD-10-CM | POA: Diagnosis not present

## 2014-06-11 DIAGNOSIS — I10 Essential (primary) hypertension: Secondary | ICD-10-CM | POA: Diagnosis not present

## 2014-06-11 DIAGNOSIS — I509 Heart failure, unspecified: Secondary | ICD-10-CM | POA: Diagnosis not present

## 2014-06-11 DIAGNOSIS — I2510 Atherosclerotic heart disease of native coronary artery without angina pectoris: Secondary | ICD-10-CM | POA: Diagnosis not present

## 2014-06-13 DIAGNOSIS — I714 Abdominal aortic aneurysm, without rupture, unspecified: Secondary | ICD-10-CM | POA: Diagnosis not present

## 2014-06-13 DIAGNOSIS — I509 Heart failure, unspecified: Secondary | ICD-10-CM | POA: Diagnosis not present

## 2014-06-13 DIAGNOSIS — I10 Essential (primary) hypertension: Secondary | ICD-10-CM | POA: Diagnosis not present

## 2014-06-13 DIAGNOSIS — I2510 Atherosclerotic heart disease of native coronary artery without angina pectoris: Secondary | ICD-10-CM | POA: Diagnosis not present

## 2014-06-13 DIAGNOSIS — I5043 Acute on chronic combined systolic (congestive) and diastolic (congestive) heart failure: Secondary | ICD-10-CM | POA: Diagnosis not present

## 2014-06-14 DIAGNOSIS — I509 Heart failure, unspecified: Secondary | ICD-10-CM | POA: Diagnosis not present

## 2014-06-14 DIAGNOSIS — I714 Abdominal aortic aneurysm, without rupture, unspecified: Secondary | ICD-10-CM | POA: Diagnosis not present

## 2014-06-14 DIAGNOSIS — I2510 Atherosclerotic heart disease of native coronary artery without angina pectoris: Secondary | ICD-10-CM | POA: Diagnosis not present

## 2014-06-14 DIAGNOSIS — F411 Generalized anxiety disorder: Secondary | ICD-10-CM | POA: Diagnosis not present

## 2014-06-14 DIAGNOSIS — I10 Essential (primary) hypertension: Secondary | ICD-10-CM | POA: Diagnosis not present

## 2014-06-14 DIAGNOSIS — I5020 Unspecified systolic (congestive) heart failure: Secondary | ICD-10-CM | POA: Diagnosis not present

## 2014-06-14 DIAGNOSIS — I5043 Acute on chronic combined systolic (congestive) and diastolic (congestive) heart failure: Secondary | ICD-10-CM | POA: Diagnosis not present

## 2014-06-15 DIAGNOSIS — I2510 Atherosclerotic heart disease of native coronary artery without angina pectoris: Secondary | ICD-10-CM | POA: Diagnosis not present

## 2014-06-15 DIAGNOSIS — I714 Abdominal aortic aneurysm, without rupture, unspecified: Secondary | ICD-10-CM | POA: Diagnosis not present

## 2014-06-15 DIAGNOSIS — I5043 Acute on chronic combined systolic (congestive) and diastolic (congestive) heart failure: Secondary | ICD-10-CM | POA: Diagnosis not present

## 2014-06-15 DIAGNOSIS — I10 Essential (primary) hypertension: Secondary | ICD-10-CM | POA: Diagnosis not present

## 2014-06-15 DIAGNOSIS — I509 Heart failure, unspecified: Secondary | ICD-10-CM | POA: Diagnosis not present

## 2014-06-16 ENCOUNTER — Ambulatory Visit (HOSPITAL_COMMUNITY)
Admission: RE | Admit: 2014-06-16 | Discharge: 2014-06-16 | Disposition: A | Discharge status: Home / Self Care | Payer: Medicare Other | Source: Ambulatory Visit | Attending: Internal Medicine | Admitting: Internal Medicine

## 2014-06-16 VITALS — BP 110/62 | HR 68 | Wt 85.0 lb

## 2014-06-16 DIAGNOSIS — I4891 Unspecified atrial fibrillation: Secondary | ICD-10-CM | POA: Diagnosis not present

## 2014-06-16 DIAGNOSIS — I2510 Atherosclerotic heart disease of native coronary artery without angina pectoris: Secondary | ICD-10-CM | POA: Diagnosis not present

## 2014-06-16 DIAGNOSIS — Z7982 Long term (current) use of aspirin: Secondary | ICD-10-CM | POA: Diagnosis not present

## 2014-06-16 DIAGNOSIS — I714 Abdominal aortic aneurysm, without rupture, unspecified: Secondary | ICD-10-CM | POA: Insufficient documentation

## 2014-06-16 DIAGNOSIS — I701 Atherosclerosis of renal artery: Secondary | ICD-10-CM | POA: Diagnosis not present

## 2014-06-16 DIAGNOSIS — I5032 Chronic diastolic (congestive) heart failure: Secondary | ICD-10-CM | POA: Insufficient documentation

## 2014-06-16 DIAGNOSIS — Z8249 Family history of ischemic heart disease and other diseases of the circulatory system: Secondary | ICD-10-CM | POA: Diagnosis not present

## 2014-06-16 DIAGNOSIS — E785 Hyperlipidemia, unspecified: Secondary | ICD-10-CM | POA: Insufficient documentation

## 2014-06-16 DIAGNOSIS — I739 Peripheral vascular disease, unspecified: Secondary | ICD-10-CM | POA: Diagnosis not present

## 2014-06-16 DIAGNOSIS — I5030 Unspecified diastolic (congestive) heart failure: Secondary | ICD-10-CM | POA: Diagnosis not present

## 2014-06-16 DIAGNOSIS — I519 Heart disease, unspecified: Secondary | ICD-10-CM | POA: Diagnosis not present

## 2014-06-16 DIAGNOSIS — I10 Essential (primary) hypertension: Secondary | ICD-10-CM | POA: Insufficient documentation

## 2014-06-16 DIAGNOSIS — I509 Heart failure, unspecified: Secondary | ICD-10-CM | POA: Insufficient documentation

## 2014-06-16 LAB — CBC WITH DIFFERENTIAL/PLATELET
Basophils Absolute: 0.1 10*3/uL (ref 0.0–0.1)
Basophils Relative: 1 % (ref 0–1)
Eosinophils Absolute: 0.2 10*3/uL (ref 0.0–0.7)
Eosinophils Relative: 2 % (ref 0–5)
HCT: 35.3 % — ABNORMAL LOW (ref 36.0–46.0)
Hemoglobin: 11.2 g/dL — ABNORMAL LOW (ref 12.0–15.0)
Lymphocytes Relative: 12 % (ref 12–46)
Lymphs Abs: 1.1 10*3/uL (ref 0.7–4.0)
MCH: 26.2 pg (ref 26.0–34.0)
MCHC: 31.7 g/dL (ref 30.0–36.0)
MCV: 82.7 fL (ref 78.0–100.0)
Monocytes Absolute: 0.7 10*3/uL (ref 0.1–1.0)
Monocytes Relative: 7 % (ref 3–12)
Neutro Abs: 7.3 10*3/uL (ref 1.7–7.7)
Neutrophils Relative %: 78 % — ABNORMAL HIGH (ref 43–77)
Platelets: 344 10*3/uL (ref 150–400)
RBC: 4.27 MIL/uL (ref 3.87–5.11)
RDW: 17.3 % — ABNORMAL HIGH (ref 11.5–15.5)
WBC: 9.3 10*3/uL (ref 4.0–10.5)

## 2014-06-16 LAB — HEPATIC FUNCTION PANEL
ALT: 25 U/L (ref 0–35)
AST: 32 U/L (ref 0–37)
Albumin: 3 g/dL — ABNORMAL LOW (ref 3.5–5.2)
Alkaline Phosphatase: 85 U/L (ref 39–117)
Bilirubin, Direct: <0.2 mg/dL (ref 0.0–0.3)
Total Bilirubin: 0.3 mg/dL (ref 0.3–1.2)
Total Protein: 6.7 g/dL (ref 6.0–8.3)

## 2014-06-16 LAB — TSH: TSH: 0.979 u[IU]/mL (ref 0.350–4.500)

## 2014-06-16 LAB — DIGOXIN LEVEL: Digoxin Level: <0.3 ng/mL — ABNORMAL LOW (ref 0.8–2.0)

## 2014-06-16 MED ORDER — AMIODARONE HCL 200 MG PO TABS: 200.0000 mg | ORAL_TABLET | Freq: Every day | ORAL | Status: DC

## 2014-06-16 NOTE — Patient Instructions (Signed)
DECREASE Amiodarone to 200 mg daily  Labs today  Your physician recommends that you schedule a follow-up appointment in: 3 weeks  Do the following things EVERYDAY: 1) Weigh yourself in the morning before breakfast. Write it down and keep it in a log. 2) Take your medicines as prescribed 3) Eat low salt foods-Limit salt (sodium) to 2000 mg per day.  4) Stay as active as you can everyday 5) Limit all fluids for the day to less than 2 liters 6)

## 2014-06-17 ENCOUNTER — Encounter (HOSPITAL_COMMUNITY): Payer: Self-pay | Admitting: Emergency Medicine

## 2014-06-17 ENCOUNTER — Emergency Department (HOSPITAL_COMMUNITY): Payer: Medicare Other

## 2014-06-17 ENCOUNTER — Emergency Department (HOSPITAL_COMMUNITY)
Admission: EM | Admit: 2014-06-17 | Discharge: 2014-06-17 | Disposition: A | Discharge status: Home / Self Care | Payer: Medicare Other | Attending: Emergency Medicine | Admitting: Emergency Medicine

## 2014-06-17 DIAGNOSIS — S0993XA Unspecified injury of face, initial encounter: Secondary | ICD-10-CM | POA: Diagnosis not present

## 2014-06-17 DIAGNOSIS — S0003XA Contusion of scalp, initial encounter: Secondary | ICD-10-CM | POA: Diagnosis not present

## 2014-06-17 DIAGNOSIS — Z7982 Long term (current) use of aspirin: Secondary | ICD-10-CM | POA: Insufficient documentation

## 2014-06-17 DIAGNOSIS — S199XXA Unspecified injury of neck, initial encounter: Secondary | ICD-10-CM | POA: Diagnosis not present

## 2014-06-17 DIAGNOSIS — Y9389 Activity, other specified: Secondary | ICD-10-CM | POA: Diagnosis not present

## 2014-06-17 DIAGNOSIS — W1809XA Striking against other object with subsequent fall, initial encounter: Secondary | ICD-10-CM | POA: Insufficient documentation

## 2014-06-17 DIAGNOSIS — Y9289 Other specified places as the place of occurrence of the external cause: Secondary | ICD-10-CM | POA: Diagnosis not present

## 2014-06-17 DIAGNOSIS — I509 Heart failure, unspecified: Secondary | ICD-10-CM | POA: Diagnosis not present

## 2014-06-17 DIAGNOSIS — S0990XA Unspecified injury of head, initial encounter: Secondary | ICD-10-CM | POA: Insufficient documentation

## 2014-06-17 DIAGNOSIS — Z7902 Long term (current) use of antithrombotics/antiplatelets: Secondary | ICD-10-CM | POA: Diagnosis not present

## 2014-06-17 DIAGNOSIS — I714 Abdominal aortic aneurysm, without rupture, unspecified: Secondary | ICD-10-CM | POA: Diagnosis not present

## 2014-06-17 DIAGNOSIS — Z9861 Coronary angioplasty status: Secondary | ICD-10-CM | POA: Diagnosis not present

## 2014-06-17 DIAGNOSIS — I5032 Chronic diastolic (congestive) heart failure: Secondary | ICD-10-CM | POA: Insufficient documentation

## 2014-06-17 DIAGNOSIS — I10 Essential (primary) hypertension: Secondary | ICD-10-CM | POA: Insufficient documentation

## 2014-06-17 DIAGNOSIS — S0100XA Unspecified open wound of scalp, initial encounter: Secondary | ICD-10-CM | POA: Diagnosis not present

## 2014-06-17 DIAGNOSIS — S1093XA Contusion of unspecified part of neck, initial encounter: Secondary | ICD-10-CM | POA: Diagnosis not present

## 2014-06-17 DIAGNOSIS — I2510 Atherosclerotic heart disease of native coronary artery without angina pectoris: Secondary | ICD-10-CM | POA: Diagnosis not present

## 2014-06-17 DIAGNOSIS — S0083XA Contusion of other part of head, initial encounter: Secondary | ICD-10-CM | POA: Diagnosis not present

## 2014-06-17 DIAGNOSIS — Z23 Encounter for immunization: Secondary | ICD-10-CM | POA: Insufficient documentation

## 2014-06-17 DIAGNOSIS — I5043 Acute on chronic combined systolic (congestive) and diastolic (congestive) heart failure: Secondary | ICD-10-CM | POA: Diagnosis not present

## 2014-06-17 DIAGNOSIS — Z79899 Other long term (current) drug therapy: Secondary | ICD-10-CM | POA: Insufficient documentation

## 2014-06-17 DIAGNOSIS — W19XXXA Unspecified fall, initial encounter: Secondary | ICD-10-CM

## 2014-06-17 DIAGNOSIS — E785 Hyperlipidemia, unspecified: Secondary | ICD-10-CM | POA: Insufficient documentation

## 2014-06-17 LAB — CBC WITH DIFFERENTIAL/PLATELET
Basophils Absolute: 0 10*3/uL (ref 0.0–0.1)
Basophils Relative: 1 % (ref 0–1)
Eosinophils Absolute: 0.2 10*3/uL (ref 0.0–0.7)
Eosinophils Relative: 2 % (ref 0–5)
HCT: 32.7 % — ABNORMAL LOW (ref 36.0–46.0)
Hemoglobin: 10.5 g/dL — ABNORMAL LOW (ref 12.0–15.0)
Lymphocytes Relative: 14 % (ref 12–46)
Lymphs Abs: 1.2 10*3/uL (ref 0.7–4.0)
MCH: 26.5 pg (ref 26.0–34.0)
MCHC: 32.1 g/dL (ref 30.0–36.0)
MCV: 82.6 fL (ref 78.0–100.0)
Monocytes Absolute: 0.7 10*3/uL (ref 0.1–1.0)
Monocytes Relative: 8 % (ref 3–12)
Neutro Abs: 6.1 10*3/uL (ref 1.7–7.7)
Neutrophils Relative %: 75 % (ref 43–77)
Platelets: 336 10*3/uL (ref 150–400)
RBC: 3.96 MIL/uL (ref 3.87–5.11)
RDW: 16.9 % — ABNORMAL HIGH (ref 11.5–15.5)
WBC: 8.1 10*3/uL (ref 4.0–10.5)

## 2014-06-17 LAB — I-STAT TROPONIN, ED: Troponin i, poc: 0.02 ng/mL (ref 0.00–0.08)

## 2014-06-17 LAB — COMPREHENSIVE METABOLIC PANEL
ALT: 27 U/L (ref 0–35)
AST: 29 U/L (ref 0–37)
Albumin: 3 g/dL — ABNORMAL LOW (ref 3.5–5.2)
Alkaline Phosphatase: 84 U/L (ref 39–117)
Anion gap: 10 (ref 5–15)
BUN: 33 mg/dL — ABNORMAL HIGH (ref 6–23)
CO2: 33 mEq/L — ABNORMAL HIGH (ref 19–32)
Calcium: 9.4 mg/dL (ref 8.4–10.5)
Chloride: 94 mEq/L — ABNORMAL LOW (ref 96–112)
Creatinine, Ser: 0.98 mg/dL (ref 0.50–1.10)
GFR calc Af Amer: 58 mL/min — ABNORMAL LOW (ref >90)
GFR calc non Af Amer: 50 mL/min — ABNORMAL LOW (ref >90)
Glucose, Bld: 103 mg/dL — ABNORMAL HIGH (ref 70–99)
Potassium: 4.8 mEq/L (ref 3.7–5.3)
Sodium: 137 mEq/L (ref 137–147)
Total Bilirubin: 0.3 mg/dL (ref 0.3–1.2)
Total Protein: 6.9 g/dL (ref 6.0–8.3)

## 2014-06-17 MED ORDER — TETANUS-DIPHTH-ACELL PERTUSSIS 5-2.5-18.5 LF-MCG/0.5 IM SUSP
0.5000 mL | Freq: Once | INTRAMUSCULAR | Status: AC
  Administered 2014-06-17: 0.5 mL via INTRAMUSCULAR
  Filled 2014-06-17: qty 0.5

## 2014-06-17 NOTE — ED Provider Notes (Signed)
CSN: [id number]     Arrival date & time 06/17/14  2057 History   First MD Initiated Contact with Patient 06/17/14 2102     Chief Complaint  Patient presents with  . Fall  . Head Laceration     (Consider location/radiation/quality/duration/timing/severity/associated sxs/prior Treatment) The history is provided by the patient and medical records.   This is an 80 year old female with past medical history significant for hypertension, hyperlipidemia, coronary artery disease, congestive heart failure, A. fib on Eliquis and milrinone drip, presenting to the ED for a fall. Patient states she was getting up out of a chair while wearing her bedroom slippers, lost her footing and fell hitting the left side of her head against a wooden chair. She denies loss of consciousness. Patient denies any dizziness, lightheadedness, or feelings of syncope prior to the fall.  She denies any current headache, dizziness, visual disturbance, confusion, tinnitus. Denies current CP, SOB, palpitations. Date of last tetanus unknown.  Past Medical History  Diagnosis Date  . PVD (peripheral vascular disease)   . Ventricular dysfunction     left; ischemic  . Hyperlipidemia   . AAA (abdominal aortic aneurysm)   . Renal artery stenosis   . Hypertension   . Coronary artery disease 2007    moderate ASCAD of the left system s/p PCI of the D2 and PCI of the left circ 03/2009  . Chronic systolic heart failure     a. ECHO (04/2014) EF 20-25%, diff HK, mild MR  . Atrial fibrillation    Past Surgical History  Procedure Laterality Date  . Retinal cryopexy      right eye (for retinal detachment)  . Angioplasty    . Coronary stent placement     Family History  Problem Relation Age of Onset  . Heart disease Mother   . Heart disease Father   . Heart disease Brother     x 3   History  Substance Use Topics  . Smoking status: Never Smoker   . Smokeless tobacco: Never Used  . Alcohol Use: Yes     Comment: occasionally    OB History   Grav Para Term Preterm Abortions TAB SAB Ect Mult Living                 Review of Systems  Skin: Positive for wound.  All other systems reviewed and are negative.     Allergies  Codeine and Garlic  Home Medications   Prior to Admission medications   Medication Sig Start Date End Date Taking? Authorizing Provider  acetaminophen (TYLENOL) 500 MG tablet Take 500 mg by mouth every 6 (six) hours as needed (for pain).     Historical Provider, MD  amiodarone (PACERONE) 200 MG tablet Take 1 tablet (200 mg total) by mouth daily. 06/16/14   Larey Dresser, MD  apixaban (ELIQUIS) 2.5 MG TABS tablet Take 1 tablet (2.5 mg total) by mouth 2 (two) times daily. 05/20/14   Evelene Croon Barrett, PA-C  aspirin 81 MG chewable tablet Chew 81 mg by mouth daily.    Historical Provider, MD  feeding supplement, ENSURE COMPLETE, (ENSURE COMPLETE) LIQD Take 237 mLs by mouth 2 (two) times daily between meals. 05/20/14   Rhonda G Barrett, PA-C  milrinone (PRIMACOR) 20 MG/100ML SOLN infusion Inject 4.95 mcg/min into the vein continuous. Per [facility] 05/20/14   Evelene Croon Barrett, PA-C  Multiple Vitamin (MULTIVITAMIN) tablet Take 1 tablet by mouth daily.    Historical Provider, MD  nitroGLYCERIN (NITROSTAT)  0.4 MG SL tablet Place 0.4 mg under the tongue every 5 (five) minutes as needed for chest pain.    Historical Provider, MD  Omega-3 Fatty Acids (FISH OIL) 1200 MG CAPS Take 1 capsule by mouth daily.    Historical Provider, MD  sertraline (ZOLOFT) 25 MG tablet Take 25 mg by mouth daily.    Historical Provider, MD  torsemide (DEMADEX) 20 MG tablet Take 2 tablets (40 mg total) by mouth 2 (two) times daily. 05/26/14   Rande Brunt, NP   BP 121/74  Pulse 80  Temp(Src) 98 F (36.7 C) (Oral)  Resp 18  SpO2 93%  Physical Exam  Nursing note and vitals reviewed. Constitutional: She is oriented to person, place, and time. She appears well-developed and well-nourished. No distress.  HENT:   Head: Normocephalic. Head is with laceration.    Mouth/Throat: Oropharynx is clear and moist.  2 cm superficial laceration to left parietal scalp, no active bleeding, scalp hematoma noted  Eyes: Conjunctivae and EOM are normal. Pupils are equal, round, and reactive to light.  Neck: Normal range of motion. Neck supple.  Cardiovascular: Normal rate, regular rhythm and normal heart sounds.   Pulmonary/Chest: Effort normal and breath sounds normal. No respiratory distress. She has no wheezes.  Abdominal: Soft. Bowel sounds are normal. There is no tenderness. There is no guarding.  Musculoskeletal: Normal range of motion. She exhibits no edema.  PICC line LUE; site appear clean; milrinone infusing  Neurological: She is alert and oriented to person, place, and time.  AAOx3, answering questions appropriately; equal strength UE and LE bilaterally; CN grossly intact; moves all extremities appropriately without ataxia; no focal neuro deficits or facial asymmetry appreciated  Skin: Skin is warm and dry. She is not diaphoretic.  Psychiatric: She has a normal mood and affect.    ED Course  Procedures (including critical care time)  LACERATION REPAIR Performed by: Larene Pickett Authorized by: Larene Pickett Consent: Verbal consent obtained. Risks and benefits: risks, benefits and alternatives were discussed Consent given by: patient Patient identity confirmed: provided demographic data Prepped and Draped in normal sterile fashion Wound explored  Laceration Location: left parietal scalp, superficial  Laceration Length: 2 cm  No Foreign Bodies seen or palpated  Anesthesia: none  Local anesthetic: none  Anesthetic total: 0 ml  Irrigation method: syringe Amount of cleaning: standard  Skin closure: dermabond  Number of sutures: 0  Technique: n/a  Patient tolerance: Patient tolerated the procedure well with no immediate complications.  Labs Review Labs Reviewed  CBC WITH  DIFFERENTIAL - Abnormal; Notable for the following:    Hemoglobin 10.5 (*)    HCT 32.7 (*)    RDW 16.9 (*)    All other components within normal limits  COMPREHENSIVE METABOLIC PANEL - Abnormal; Notable for the following:    Chloride 94 (*)    CO2 33 (*)    Glucose, Bld 103 (*)    BUN 33 (*)    Albumin 3.0 (*)    GFR calc non Af Amer 50 (*)    GFR calc Af Amer 58 (*)    All other components within normal limits  I-STAT TROPOININ, ED    Imaging Review Ct Head Wo Contrast  06/17/2014   CLINICAL DATA:  Fall, head laceration  EXAM: CT HEAD WITHOUT CONTRAST  CT CERVICAL SPINE WITHOUT CONTRAST  TECHNIQUE: Multidetector CT imaging of the head and cervical spine was performed following the standard protocol without intravenous contrast. Multiplanar CT image  reconstructions of the cervical spine were also generated.  COMPARISON:  Prior CT from 01/27/2014  FINDINGS: CT HEAD FINDINGS  Diffuse prominence of the CSF containing spaces is compatible with generalized parenchymal atrophy. Scattered and confluent hypodensity within the periventricular and deep white matter both cerebral hemispheres most consistent with moderate chronic small vessel disease, stable.  No acute intracranial hemorrhage or large vessel territory infarct. No mass lesion, mass effect, or midline shift. No hydrocephalus. No extra-axial fluid collection.  The tiny left parietal scalp contusion is present. Calvarium is intact.  Postsurgical changes present about the right globe. No acute abnormality seen about either orbit.  Paranasal sinuses and mastoid air cells are clear.  CT CERVICAL SPINE FINDINGS  The vertebral bodies are normally aligned with preservation of the normal cervical lordosis. Vertebral body heights are preserved. Normal C1-2 articulations are intact. No prevertebral soft tissue swelling. No acute fracture or listhesis.  Extensive multilevel degenerative changes present throughout the cervical spine as evidenced by a  intervertebral disc space narrowing, disc desiccation and calcification. Prominent osteoarthritic changes present about the C1-2 articulation.  Visualized soft tissues of the neck are within normal limits. No apical pneumothorax. Mildly prominent interlobular septal thickening seen within the partially visualized lung apices, indeterminate.  IMPRESSION: CT BRAIN:  1. No acute intracranial process. 2. Small left parietal scalp contusion. 3. Generalized cerebral atrophy with moderate chronic small vessel ischemic disease, stable.  CT CERVICAL SPINE:  1. No acute traumatic injury within the cervical spine. 2. Mild interlobular septal thickening within the visualized lung apices, incompletely visualized, but may represent mild pulmonary edema. Further evaluation with chest radiography could be performed as clinically indicated.   Electronically Signed   By: Jeannine Boga M.D.   On: 06/17/2014 22:07   Ct Cervical Spine Wo Contrast  06/17/2014   CLINICAL DATA:  Fall, head laceration  EXAM: CT HEAD WITHOUT CONTRAST  CT CERVICAL SPINE WITHOUT CONTRAST  TECHNIQUE: Multidetector CT imaging of the head and cervical spine was performed following the standard protocol without intravenous contrast. Multiplanar CT image reconstructions of the cervical spine were also generated.  COMPARISON:  Prior CT from 01/27/2014  FINDINGS: CT HEAD FINDINGS  Diffuse prominence of the CSF containing spaces is compatible with generalized parenchymal atrophy. Scattered and confluent hypodensity within the periventricular and deep white matter both cerebral hemispheres most consistent with moderate chronic small vessel disease, stable.  No acute intracranial hemorrhage or large vessel territory infarct. No mass lesion, mass effect, or midline shift. No hydrocephalus. No extra-axial fluid collection.  The tiny left parietal scalp contusion is present. Calvarium is intact.  Postsurgical changes present about the right globe. No acute  abnormality seen about either orbit.  Paranasal sinuses and mastoid air cells are clear.  CT CERVICAL SPINE FINDINGS  The vertebral bodies are normally aligned with preservation of the normal cervical lordosis. Vertebral body heights are preserved. Normal C1-2 articulations are intact. No prevertebral soft tissue swelling. No acute fracture or listhesis.  Extensive multilevel degenerative changes present throughout the cervical spine as evidenced by a intervertebral disc space narrowing, disc desiccation and calcification. Prominent osteoarthritic changes present about the C1-2 articulation.  Visualized soft tissues of the neck are within normal limits. No apical pneumothorax. Mildly prominent interlobular septal thickening seen within the partially visualized lung apices, indeterminate.  IMPRESSION: CT BRAIN:  1. No acute intracranial process. 2. Small left parietal scalp contusion. 3. Generalized cerebral atrophy with moderate chronic small vessel ischemic disease, stable.  CT CERVICAL SPINE:  1. No acute traumatic injury within the cervical spine. 2. Mild interlobular septal thickening within the visualized lung apices, incompletely visualized, but may represent mild pulmonary edema. Further evaluation with chest radiography could be performed as clinically indicated.   Electronically Signed   By: Jeannine Boga M.D.   On: 06/17/2014 22:07     EKG Interpretation   Date/Time:  Friday June 17 2014 21:36:52 EDT Ventricular Rate:  74 PR Interval:  214 QRS Duration: 168 QT Interval:  449 QTC Calculation: 498 R Axis:   -57 Text Interpretation:  Sinus rhythm Borderline prolonged PR interval Left  bundle branch block No change vs 06-16-2014 Confirmed by Jeneen Rinks  MD, [place]  [phone]) on 06/17/2014 11:54:20 PM      MDM   Final diagnoses:  Fall from standing, initial encounter  Head injury, initial encounter   80 year old female with mechanical fall when getting out of a chair. Head injury without  loss of consciousness. On exam, she is awake and alert, answering questions and following commands appropriately. She has no current complaints.  She does have a 2cm superficial laceration to her left parietal scalp. There is no active bleeding. She is currently on Eliquis for AFIB. Will obtain CT head and cervical spine as well as basic labs.  EKG NSR without ischemic changes.  Lab work is reassuring.  CT head and CS negative for acute findings, scalp hematoma noted.  Laceration superficial and repaired with dermabond.  Tetanus updated.  Patient remains alert and baseline oriented without complaints.  She is discharged home with her family.  Will FU with PCP.  Discussed plan with patient, he/she acknowledged understanding and agreed with plan of care.  Return precautions given for new or worsening symptoms.  Case discussed with attending physician, Dr. Jeneen Rinks, who personally evaluated patient and agrees with assessment and plan of care.  Larene Pickett, PA-C 06/17/14 2357

## 2014-06-17 NOTE — ED Provider Notes (Signed)
To see and evaluated by PA L. Tye Savoy. I discussed the patient's care with her. I evaluated the patient independently. Describes trip and fall, and denies syncope.  Minimal headache, but reassuring CT. Small Occipital/parietal laceration.  [facility], I agree with assessment, and disposition.  Tanna Furry, MD 06/17/14 [phone]

## 2014-06-17 NOTE — Discharge Instructions (Signed)
Continue all home medications. Follow-up with your primary care physician. Return to the ED for new or worsening symptoms.

## 2014-06-17 NOTE — Progress Notes (Signed)
Patient ID: [name], female   DOB: 1924/06/20, 80 y.o.   MRN: [id number] PCP: [name]  HPI: Ms. Burling Margarita Grizzle) is an 80 y/o woman with CAD, AAA, systolic HF, dementia, afib and CAD. She has had two previous coronary interventions including a cutting balloon to her D2 in 2007 and a DES to her mid LCX in 2010. Her LV dysfunction has been out of proportion to her CAD.  Last echo in 7/15 showed EF 20-25% with severe LV dilation, mild MR, and PA systolic pressure 38 mmHg.   Admitted 7/11-7/24/15 with syncope and dyspnea. She had new onset A-fib/RVR. Troponin was 1.03> 1.59> 2.18 . She was placed on amiodarone and heparin drip. She developed respiratory distress and required intubation. She converted to NSR but later was bradycardiac with NSVT. Amiodarone temporarily stopped but restarted after hyperkalemia resolved. Hyperkalemia was thought to be contributing to bradycardia. Diuresed with IV lasix. Co-ox 29% and started on milrinone which we tried to wean off but she did not tolerate. Discharge weight 87 lbs.   She has been stable on home milrinone since last appointment.  She is working with PT at home.  She is walking with a walker.  Weight is up a bit but she has been eating more.  She continues on 2 L home oxygen.  No chest pain.  No dyspnea walking around her house.    ECG: NSR, 1st degree AV block, LBBB   Labs: (05/23/14) K+ 4.3, creatinine 1.06, digoxin 1.1 (8/15) K 4.7, creatinine 0.94, HCT 33.2  ROS: All systems negative except as listed in HPI, PMH and Problem List.  SH:  History   Social History  . Marital Status: Married    Spouse Name: N/A    Number of Children: 1  . Years of Education: N/A   Occupational History  . retired    Social History Main Topics  . Smoking status: Never Smoker   . Smokeless tobacco: Never Used  . Alcohol Use: Yes     Comment: occasionally  . Drug Use: No  . Sexual Activity: Not on file   Other Topics Concern  . Not on file   Social  History Narrative   She lives in [place], family helps with her care.    FH:  Family History  Problem Relation Age of Onset  . Heart disease Mother   . Heart disease Father   . Heart disease Brother     x 3    Past Medical History  Diagnosis Date  . PVD (peripheral vascular disease)   . Ventricular dysfunction     left; ischemic  . Hyperlipidemia   . AAA (abdominal aortic aneurysm)   . Renal artery stenosis   . Hypertension   . Coronary artery disease 2007    moderate ASCAD of the left system s/p PCI of the D2 and PCI of the left circ 03/2009  . Chronic systolic heart failure     a. ECHO (04/2014) EF 20-25%, diff HK, mild MR  . Atrial fibrillation     Current Outpatient Prescriptions  Medication Sig Dispense Refill  . acetaminophen (TYLENOL) 500 MG tablet Take 500 mg by mouth every 6 (six) hours as needed (for pain).       Marland Kitchen amiodarone (PACERONE) 200 MG tablet Take 1 tablet (200 mg total) by mouth daily.  30 tablet  6  . apixaban (ELIQUIS) 2.5 MG TABS tablet Take 1 tablet (2.5 mg total) by mouth 2 (two) times daily.  [place]  tablet  0  . aspirin 81 MG chewable tablet Chew 81 mg by mouth daily.      . feeding supplement, ENSURE COMPLETE, (ENSURE COMPLETE) LIQD Take 237 mLs by mouth 2 (two) times daily between meals.      . milrinone (PRIMACOR) 20 MG/100ML SOLN infusion Inject 4.95 mcg/min into the vein continuous. Per [facility]  100 mL  3  . Multiple Vitamin (MULTIVITAMIN) tablet Take 1 tablet by mouth daily.      . nitroGLYCERIN (NITROSTAT) 0.4 MG SL tablet Place 0.4 mg under the tongue every 5 (five) minutes as needed for chest pain.      . Omega-3 Fatty Acids (FISH OIL) 1200 MG CAPS Take 1 capsule by mouth daily.      . sertraline (ZOLOFT) 25 MG tablet Take 25 mg by mouth daily.      Marland Kitchen torsemide (DEMADEX) 20 MG tablet Take 2 tablets (40 mg total) by mouth 2 (two) times daily.  120 tablet  3   No current facility-administered medications for this encounter.     Filed Vitals:   06/16/14 1143  BP: 110/62  Pulse: 68  Weight: 85 lb (38.556 kg)  SpO2: 96%    PHYSICAL EXAM:  General:  Elderly appearing. Frail, No resp difficulty, in wheelchair; family present HEENT: normal Neck: supple. JVP flat. Carotids 2+ bilaterally; no bruits. No lymphadenopathy or thryomegaly appreciated. Cor: PMI normal. Regular rate & rhythm. No rubs, gallops.  1/6 SEM RUSB.  Lungs: clear Abdomen: soft, nontender, nondistended. No hepatosplenomegaly. No bruits or masses. Good bowel sounds. Extremities: no cyanosis, clubbing, rash, edema Neuro: alert & orientedx3, cranial nerves grossly intact. Moves all 4 extremities w/o difficulty. Affect pleasant.   ASSESSMENT & PLAN:  1) Chronic Systolic Heart Failure: Mixed ischemic/nonischemic cardiomyopathy (degree of LV dysfunction is out of proportion to CAD), EF 25-30% (04/2014).  She developed acute on chronic systolic CHF when she went into atrial fibrillation with RVR but was unable to titrate off milrinone after going back into NSR.  She continues on milrinone 0.25 at home.  She is doing well overall.  Frail but walking with walker around house without significant dyspnea.   She is not volume overloaded on exam.   - Continue torsemide 40 mg bid, recent BMET looked ok.  - Continue milrinone through PICC.  Will consider weaning in the near future.  - Not on beta blocker at this time with low BP/low output.  - SBP soft will not add ACE-I currently. - Will check oxygen level at rest, suspect we can stop oxygen at rest.  Would continue oxygen while sleeping and oxygen with exertion for now.  - Reinforced the need and importance of daily weights, a low sodium diet, and fluid restriction (less than 2 L a day). Instructed to call the HF clinic if weight increases more than 3 lbs overnight or 5 lbs in a week.  2) Atrial fibrillation: Paroxysmal.  Currently in NSR.  Continue apixaban 2.5 mg bid (weight less than 60 kg and age >50).   She can decrease amiodarone to 200 mg daily.  Will check LFTs and TSH today. Will also check CBC given apixaban use.  3) Deconditioning: Continue PT.  4) Limited Code: No CPR or defibrillation 5) CAD: No chest pain. Continue ASA.   Loralie Champagne 06/17/2014

## 2014-06-17 NOTE — ED Notes (Signed)
Pt reports standing up out of a chair and had a mechanical fall into a wooden corner of a chair. Lac to L side of head. On Eliquis s/p MI, PCI. Has milrinone infusing currently.

## 2014-06-20 ENCOUNTER — Telehealth (HOSPITAL_COMMUNITY): Payer: Self-pay | Admitting: Vascular Surgery

## 2014-06-20 ENCOUNTER — Encounter (HOSPITAL_COMMUNITY): Payer: Self-pay | Admitting: Cardiology

## 2014-06-20 DIAGNOSIS — I4891 Unspecified atrial fibrillation: Secondary | ICD-10-CM

## 2014-06-20 NOTE — Telephone Encounter (Addendum)
REFILL Eliquis and Amiodarone.... Called again... Please call pt son abot pt O2 STATS

## 2014-06-21 DIAGNOSIS — I714 Abdominal aortic aneurysm, without rupture, unspecified: Secondary | ICD-10-CM | POA: Diagnosis not present

## 2014-06-21 DIAGNOSIS — I10 Essential (primary) hypertension: Secondary | ICD-10-CM | POA: Diagnosis not present

## 2014-06-21 DIAGNOSIS — I2510 Atherosclerotic heart disease of native coronary artery without angina pectoris: Secondary | ICD-10-CM | POA: Diagnosis not present

## 2014-06-21 DIAGNOSIS — I509 Heart failure, unspecified: Secondary | ICD-10-CM | POA: Diagnosis not present

## 2014-06-21 DIAGNOSIS — I5043 Acute on chronic combined systolic (congestive) and diastolic (congestive) heart failure: Secondary | ICD-10-CM | POA: Diagnosis not present

## 2014-06-21 MED ORDER — APIXABAN 2.5 MG PO TABS: 2.5000 mg | ORAL_TABLET | Freq: Two times a day (BID) | ORAL | Status: DC

## 2014-06-21 MED ORDER — AMIODARONE HCL 200 MG PO TABS: 200.0000 mg | ORAL_TABLET | Freq: Every day | ORAL | Status: DC

## 2014-06-21 NOTE — Telephone Encounter (Signed)
Spoke w/pt's son, refills sent in, he states o2 sats have been running upper 80s so he has put her back on her oxygen, he will continue to monitor advised that was ok need to keep sats 90% or greater per Dr Haroldine Laws

## 2014-06-21 NOTE — ED Provider Notes (Signed)
Medical screening examination/treatment/procedure(s) were conducted as a shared visit with non-physician practitioner(s) and myself.  I personally evaluated the patient during the encounter.   EKG Interpretation   Date/Time:  Friday June 17 2014 21:36:52 EDT Ventricular Rate:  74 PR Interval:  214 QRS Duration: 168 QT Interval:  449 QTC Calculation: 498 R Axis:   -57 Text Interpretation:  Sinus rhythm Borderline prolonged PR interval Left  bundle branch block No change vs 06-16-2014 Confirmed by Jeneen Rinks  MD, [place]  [phone]) on 06/17/2014 11:54:20 PM        Tanna Furry, MD 06/21/14 [phone]

## 2014-06-23 DIAGNOSIS — I714 Abdominal aortic aneurysm, without rupture, unspecified: Secondary | ICD-10-CM | POA: Diagnosis not present

## 2014-06-23 DIAGNOSIS — I5043 Acute on chronic combined systolic (congestive) and diastolic (congestive) heart failure: Secondary | ICD-10-CM | POA: Diagnosis not present

## 2014-06-23 DIAGNOSIS — I2510 Atherosclerotic heart disease of native coronary artery without angina pectoris: Secondary | ICD-10-CM | POA: Diagnosis not present

## 2014-06-23 DIAGNOSIS — I509 Heart failure, unspecified: Secondary | ICD-10-CM | POA: Diagnosis not present

## 2014-06-23 DIAGNOSIS — I10 Essential (primary) hypertension: Secondary | ICD-10-CM | POA: Diagnosis not present

## 2014-06-24 ENCOUNTER — Encounter: Payer: Self-pay | Admitting: Internal Medicine

## 2014-06-24 DIAGNOSIS — I10 Essential (primary) hypertension: Secondary | ICD-10-CM | POA: Diagnosis not present

## 2014-06-24 DIAGNOSIS — I5043 Acute on chronic combined systolic (congestive) and diastolic (congestive) heart failure: Secondary | ICD-10-CM | POA: Diagnosis not present

## 2014-06-24 DIAGNOSIS — R209 Unspecified disturbances of skin sensation: Secondary | ICD-10-CM | POA: Diagnosis not present

## 2014-06-24 DIAGNOSIS — I509 Heart failure, unspecified: Secondary | ICD-10-CM | POA: Diagnosis not present

## 2014-06-24 DIAGNOSIS — I714 Abdominal aortic aneurysm, without rupture, unspecified: Secondary | ICD-10-CM | POA: Diagnosis not present

## 2014-06-24 DIAGNOSIS — I2510 Atherosclerotic heart disease of native coronary artery without angina pectoris: Secondary | ICD-10-CM | POA: Diagnosis not present

## 2014-06-27 ENCOUNTER — Encounter: Payer: Self-pay | Admitting: Internal Medicine

## 2014-07-01 ENCOUNTER — Telehealth (HOSPITAL_COMMUNITY): Payer: Self-pay | Admitting: Vascular Surgery

## 2014-07-01 DIAGNOSIS — I509 Heart failure, unspecified: Secondary | ICD-10-CM | POA: Diagnosis not present

## 2014-07-01 DIAGNOSIS — F0390 Unspecified dementia without behavioral disturbance: Secondary | ICD-10-CM | POA: Diagnosis not present

## 2014-07-01 DIAGNOSIS — I10 Essential (primary) hypertension: Secondary | ICD-10-CM | POA: Diagnosis not present

## 2014-07-01 DIAGNOSIS — I5043 Acute on chronic combined systolic (congestive) and diastolic (congestive) heart failure: Secondary | ICD-10-CM | POA: Diagnosis not present

## 2014-07-01 DIAGNOSIS — I2510 Atherosclerotic heart disease of native coronary artery without angina pectoris: Secondary | ICD-10-CM | POA: Diagnosis not present

## 2014-07-01 DIAGNOSIS — I252 Old myocardial infarction: Secondary | ICD-10-CM | POA: Diagnosis not present

## 2014-07-01 DIAGNOSIS — I4891 Unspecified atrial fibrillation: Secondary | ICD-10-CM | POA: Diagnosis not present

## 2014-07-01 DIAGNOSIS — I714 Abdominal aortic aneurysm, without rupture: Secondary | ICD-10-CM | POA: Diagnosis not present

## 2014-07-01 DIAGNOSIS — Z452 Encounter for adjustment and management of vascular access device: Secondary | ICD-10-CM | POA: Diagnosis not present

## 2014-07-01 NOTE — ED Provider Notes (Signed)
Medical screening examination/treatment/procedure(s) were conducted as a shared visit with non-physician practitioner(s) and myself.  I personally evaluated the patient during the encounter.   EKG Interpretation   Date/Time:  Saturday May 07 2014 09:08:29 EDT Ventricular Rate:  136 PR Interval:    QRS Duration: 134 QT Interval:  320 QTC Calculation: 481 R Axis:   -51 Text Interpretation:  Atrial fibrillation with rapid ventricular response  Left axis deviation Non-specific intra-ventricular conduction block T wave  abnormality, consider lateral ischemia or digitalis effect Abnormal ECG  Afib new from prior Confirmed by Amarilis Belflower  MD, [place] ([id number]) on 05/07/2014  9:18:03 AM      See additional note for details.  Merryl Hacker, MD 07/01/14 1256

## 2014-07-05 ENCOUNTER — Encounter: Payer: Self-pay | Admitting: Internal Medicine

## 2014-07-05 ENCOUNTER — Telehealth (HOSPITAL_COMMUNITY): Payer: Self-pay | Admitting: Surgery

## 2014-07-05 ENCOUNTER — Telehealth (HOSPITAL_COMMUNITY): Payer: Self-pay | Admitting: Vascular Surgery

## 2014-07-05 MED ORDER — POTASSIUM CHLORIDE CRYS ER 20 MEQ PO TBCR: 20.0000 meq | EXTENDED_RELEASE_TABLET | Freq: Every day | ORAL | Status: DC

## 2014-07-05 NOTE — Telephone Encounter (Signed)
Ordered Potassium per Junie Bame NP--see medications.

## 2014-07-05 NOTE — Telephone Encounter (Signed)
Per Junie Bame instructed patient to take Potassium 20 meq today and daily.  I will send the prescription in to [facility] on [address]. per patient's request.

## 2014-07-05 NOTE — Telephone Encounter (Signed)
Verbal order to see her for 60 more days

## 2014-07-05 NOTE — Telephone Encounter (Signed)
Gave verbal order

## 2014-07-07 DIAGNOSIS — I5043 Acute on chronic combined systolic (congestive) and diastolic (congestive) heart failure: Secondary | ICD-10-CM | POA: Diagnosis not present

## 2014-07-08 ENCOUNTER — Encounter (HOSPITAL_COMMUNITY): Payer: Self-pay

## 2014-07-08 ENCOUNTER — Ambulatory Visit (HOSPITAL_COMMUNITY)
Admission: RE | Admit: 2014-07-08 | Discharge: 2014-07-08 | Disposition: A | Discharge status: Home / Self Care | Payer: Medicare Other | Source: Ambulatory Visit | Attending: Internal Medicine | Admitting: Internal Medicine

## 2014-07-08 VITALS — BP 110/70 | HR 73 | Wt 84.2 lb

## 2014-07-08 DIAGNOSIS — Z452 Encounter for adjustment and management of vascular access device: Secondary | ICD-10-CM | POA: Diagnosis not present

## 2014-07-08 DIAGNOSIS — I10 Essential (primary) hypertension: Secondary | ICD-10-CM | POA: Insufficient documentation

## 2014-07-08 DIAGNOSIS — I5022 Chronic systolic (congestive) heart failure: Secondary | ICD-10-CM | POA: Diagnosis not present

## 2014-07-08 DIAGNOSIS — Z7982 Long term (current) use of aspirin: Secondary | ICD-10-CM | POA: Insufficient documentation

## 2014-07-08 DIAGNOSIS — I509 Heart failure, unspecified: Secondary | ICD-10-CM | POA: Insufficient documentation

## 2014-07-08 DIAGNOSIS — R5381 Other malaise: Secondary | ICD-10-CM | POA: Insufficient documentation

## 2014-07-08 DIAGNOSIS — I252 Old myocardial infarction: Secondary | ICD-10-CM | POA: Diagnosis not present

## 2014-07-08 DIAGNOSIS — I714 Abdominal aortic aneurysm, without rupture: Secondary | ICD-10-CM | POA: Diagnosis not present

## 2014-07-08 DIAGNOSIS — I2510 Atherosclerotic heart disease of native coronary artery without angina pectoris: Secondary | ICD-10-CM | POA: Diagnosis not present

## 2014-07-08 DIAGNOSIS — I4891 Unspecified atrial fibrillation: Secondary | ICD-10-CM | POA: Diagnosis not present

## 2014-07-08 DIAGNOSIS — I5043 Acute on chronic combined systolic (congestive) and diastolic (congestive) heart failure: Secondary | ICD-10-CM | POA: Diagnosis not present

## 2014-07-08 DIAGNOSIS — F0390 Unspecified dementia without behavioral disturbance: Secondary | ICD-10-CM | POA: Diagnosis not present

## 2014-07-08 NOTE — Patient Instructions (Signed)
Doing great.  Patient is ok to not wear oxygen when walking. I checked Jacqueline Reilly oxygen saturations with walking and they remained above 94%. If you check Jacqueline Reilly saturations and they are below 90% would place Jacqueline Reilly on 2L.  Always walk with walker because she is at risk of falling.  Call any issues.  Start taking potassium 20 meq (1 tablet) daily. You can crush it up or break it and put in applesauce.   Follow up in 1 month

## 2014-07-08 NOTE — Progress Notes (Addendum)
Patient ID: [name], female   DOB: [birth year], 80 y.o.   MRN: [id number] PCP: [name]  HPI: Ms. Kreisman Margarita Grizzle) is an 80 y/o woman with CAD, AAA, systolic HF, dementia, afib and CAD. She has had two previous coronary interventions including a cutting balloon to her D2 in 2007 and a DES to her mid LCX in 2010. Her LV dysfunction has been out of proportion to her CAD.  Last echo in 7/15 showed EF 20-25% with severe LV dilation, mild MR, and PA systolic pressure 38 mmHg.   Admitted 7/11-7/24/15 with syncope and dyspnea. She had new onset A-fib/RVR. Troponin was 1.03> 1.59> 2.18 . She was placed on amiodarone and heparin drip. She developed respiratory distress and required intubation. She converted to NSR but later was bradycardiac with NSVT. Amiodarone temporarily stopped but restarted after hyperkalemia resolved. Hyperkalemia was thought to be contributing to bradycardia. Diuresed with IV lasix. Co-ox 29% and started on milrinone which we tried to wean off but she did not tolerate. Discharge weight 87 lbs.   She has been stable on home milrinone since last appointment.  She is working with PT at home.  She is walking with a walker.  Weight is up a bit but she has been eating more.  She continues on 2 L home oxygen.  No chest pain.  No dyspnea walking around her house.    ECG: NSR, 1st degree AV block, LBBB   Labs: (05/23/14) K+ 4.3, creatinine 1.06, digoxin 1.1 (8/15) K 4.7, creatinine 0.94, HCT 33.2 (9/15) K 3.4, creatinine .80  ROS: All systems negative except as listed in HPI, PMH and Problem List.  SH:  History   Social History  . Marital Status: Married    Spouse Name: N/A    Number of Children: 1  . Years of Education: N/A   Occupational History  . retired    Social History Main Topics  . Smoking status: Never Smoker   . Smokeless tobacco: Never Used  . Alcohol Use: Yes     Comment: occasionally  . Drug Use: No  . Sexual Activity: Not on file   Other Topics Concern   . Not on file   Social History Narrative   She lives in [place], family helps with her care.    FH:  Family History  Problem Relation Age of Onset  . Heart disease Mother   . Heart disease Father   . Heart disease Brother     x 3    Past Medical History  Diagnosis Date  . PVD (peripheral vascular disease)   . Ventricular dysfunction     left; ischemic  . Hyperlipidemia   . AAA (abdominal aortic aneurysm)   . Renal artery stenosis   . Hypertension   . Coronary artery disease 2007    moderate ASCAD of the left system s/p PCI of the D2 and PCI of the left circ 03/2009  . Chronic systolic heart failure     a. ECHO (04/2014) EF 20-25%, diff HK, mild MR  . Atrial fibrillation     Current Outpatient Prescriptions  Medication Sig Dispense Refill  . acetaminophen (TYLENOL) 500 MG tablet Take 500 mg by mouth every 6 (six) hours as needed (for pain).       Marland Kitchen amiodarone (PACERONE) 200 MG tablet Take 1 tablet (200 mg total) by mouth daily.  30 tablet  6  . apixaban (ELIQUIS) 2.5 MG TABS tablet Take 1 tablet (2.5 mg total) by mouth 2 (  two) times daily.  60 tablet  6  . aspirin 81 MG chewable tablet Chew 81 mg by mouth daily.      . feeding supplement, ENSURE COMPLETE, (ENSURE COMPLETE) LIQD Take 237 mLs by mouth 2 (two) times daily between meals.      . milrinone (PRIMACOR) 20 MG/100ML SOLN infusion Inject 4.95 mcg/min into the vein continuous. Per [facility]  100 mL  3  . Multiple Vitamin (MULTIVITAMIN) tablet Take 1 tablet by mouth daily.      . Omega-3 Fatty Acids (FISH OIL) 1200 MG CAPS Take 1 capsule by mouth daily.      . sertraline (ZOLOFT) 25 MG tablet Take 25 mg by mouth at bedtime.       . torsemide (DEMADEX) 20 MG tablet Take 2 tablets (40 mg total) by mouth 2 (two) times daily.  120 tablet  3  . nitroGLYCERIN (NITROSTAT) 0.4 MG SL tablet Place 0.4 mg under the tongue every 5 (five) minutes as needed for chest pain.      . potassium chloride SA (KLOR-CON M20) 20  MEQ tablet Take 1 tablet (20 mEq total) by mouth daily.  90 tablet  3   No current facility-administered medications for this encounter.    Filed Vitals:   07/08/14 1111  BP: 110/70  Pulse: 73  Weight: 84 lb 3.2 oz (38.193 kg)  SpO2: 94%    PHYSICAL EXAM:  General:  Elderly appearing. Frail, No resp difficulty; family present HEENT: normal Neck: supple. JVP flat. Carotids 2+ bilaterally; no bruits. No lymphadenopathy or thryomegaly appreciated. Cor: PMI normal. Regular rate & rhythm. No rubs, gallops.  1/6 SEM RUSB.  Lungs: clear Abdomen: soft, nontender, nondistended. No hepatosplenomegaly. No bruits or masses. Good bowel sounds. Extremities: no cyanosis, clubbing, rash, edema Neuro: alert & orientedx3, cranial nerves grossly intact. Moves all 4 extremities w/o difficulty. Affect pleasant.   ASSESSMENT & PLAN:  1) Chronic Systolic Heart Failure: Mixed ischemic/nonischemic cardiomyopathy (degree of LV dysfunction is out of proportion to CAD), EF 25-30% (04/2014).  She developed acute on chronic systolic CHF when she went into atrial fibrillation with RVR but was unable to titrate off milrinone after going back into NSR.  - NYHA II-III symptoms and volume status stable. Will continue milrinone 0.25 mcg and torsemide 40 mg BID. Renal function reviewed and has been stable. Will continue weekly labs with [facility]. Dr. [name] would like to try and wean her milrinone in the near future.  - Not on beta blocker at this time with low BP/low output.  - SBP soft will not add ACE-I currently. - Start potassium 20 meq daily for hypokalemia.  - Walked patient in the hall with no O2 and saturations remained above 94%. Discussed with the family that she does not need to wear O2 currently, however to continue to follow O2 sats and if drop less than 90% to wear 2L.  - Reinforced the need and importance of daily weights, a low sodium diet, and fluid restriction (less than 2 L a day). Instructed to call the  HF clinic if weight increases more than 3 lbs overnight or 5 lbs in a week.  2) Atrial fibrillation: Paroxysmal.  Appears to be in NSR.  Continue apixaban 2.5 mg bid (weight less than 60 kg and age >48). Continue amiodarone to 200 mg daily.   3) Deconditioning: Continue PT. She is very weak and frail. Discussed the need to use walker all the time and to not walk without it.  4) Limited Code: No CPR or defibrillation 5) CAD: No chest pain. Continue ASA.    F/U 1 month Rande Brunt 07/08/2014

## 2014-07-15 ENCOUNTER — Encounter: Payer: Self-pay | Admitting: Internal Medicine

## 2014-07-15 DIAGNOSIS — I252 Old myocardial infarction: Secondary | ICD-10-CM | POA: Diagnosis not present

## 2014-07-15 DIAGNOSIS — I5043 Acute on chronic combined systolic (congestive) and diastolic (congestive) heart failure: Secondary | ICD-10-CM | POA: Diagnosis not present

## 2014-07-15 DIAGNOSIS — I509 Heart failure, unspecified: Secondary | ICD-10-CM | POA: Diagnosis not present

## 2014-07-15 DIAGNOSIS — I714 Abdominal aortic aneurysm, without rupture: Secondary | ICD-10-CM | POA: Diagnosis not present

## 2014-07-15 DIAGNOSIS — F0390 Unspecified dementia without behavioral disturbance: Secondary | ICD-10-CM | POA: Diagnosis not present

## 2014-07-15 DIAGNOSIS — I2510 Atherosclerotic heart disease of native coronary artery without angina pectoris: Secondary | ICD-10-CM | POA: Diagnosis not present

## 2014-07-15 DIAGNOSIS — Z452 Encounter for adjustment and management of vascular access device: Secondary | ICD-10-CM | POA: Diagnosis not present

## 2014-07-21 ENCOUNTER — Encounter: Payer: Self-pay | Admitting: Internal Medicine

## 2014-07-21 ENCOUNTER — Telehealth (HOSPITAL_COMMUNITY): Payer: Self-pay | Admitting: Vascular Surgery

## 2014-07-21 NOTE — Telephone Encounter (Signed)
Nurse care manger called from [facility].. She called pt to offer [facility] services and pt was very dismissive .Marland Kitchen The nurse was concerned about readmissions and she wondering if someone can call the pt and explain what [facility] is before she called back.. Please advise

## 2014-07-22 DIAGNOSIS — I252 Old myocardial infarction: Secondary | ICD-10-CM | POA: Diagnosis not present

## 2014-07-22 DIAGNOSIS — I509 Heart failure, unspecified: Secondary | ICD-10-CM | POA: Diagnosis not present

## 2014-07-22 DIAGNOSIS — Z452 Encounter for adjustment and management of vascular access device: Secondary | ICD-10-CM | POA: Diagnosis not present

## 2014-07-22 DIAGNOSIS — F0390 Unspecified dementia without behavioral disturbance: Secondary | ICD-10-CM | POA: Diagnosis not present

## 2014-07-22 DIAGNOSIS — I5043 Acute on chronic combined systolic (congestive) and diastolic (congestive) heart failure: Secondary | ICD-10-CM | POA: Diagnosis not present

## 2014-07-22 DIAGNOSIS — I2510 Atherosclerotic heart disease of native coronary artery without angina pectoris: Secondary | ICD-10-CM | POA: Diagnosis not present

## 2014-07-22 DIAGNOSIS — I714 Abdominal aortic aneurysm, without rupture: Secondary | ICD-10-CM | POA: Diagnosis not present

## 2014-07-26 ENCOUNTER — Telehealth (HOSPITAL_COMMUNITY): Payer: Self-pay | Admitting: Vascular Surgery

## 2014-07-26 ENCOUNTER — Other Ambulatory Visit (HOSPITAL_COMMUNITY): Payer: Self-pay

## 2014-07-26 DIAGNOSIS — I4891 Unspecified atrial fibrillation: Secondary | ICD-10-CM

## 2014-07-26 MED ORDER — ASPIRIN 81 MG PO CHEW: 81.0000 mg | CHEWABLE_TABLET | Freq: Every day | ORAL | Status: DC

## 2014-07-26 MED ORDER — NITROGLYCERIN 0.4 MG SL SUBL: 0.4000 mg | SUBLINGUAL_TABLET | SUBLINGUAL | Status: DC | PRN

## 2014-07-26 MED ORDER — POTASSIUM CHLORIDE CRYS ER 20 MEQ PO TBCR: 20.0000 meq | EXTENDED_RELEASE_TABLET | Freq: Every day | ORAL | Status: DC

## 2014-07-26 MED ORDER — APIXABAN 2.5 MG PO TABS: 2.5000 mg | ORAL_TABLET | Freq: Two times a day (BID) | ORAL | Status: DC

## 2014-07-26 MED ORDER — SERTRALINE HCL 25 MG PO TABS: 25.0000 mg | ORAL_TABLET | Freq: Every day | ORAL | Status: DC

## 2014-07-26 MED ORDER — TORSEMIDE 20 MG PO TABS: 40.0000 mg | ORAL_TABLET | Freq: Two times a day (BID) | ORAL | Status: DC

## 2014-07-26 MED ORDER — ACETAMINOPHEN 500 MG PO TABS: 500.0000 mg | ORAL_TABLET | Freq: Four times a day (QID) | ORAL | Status: DC | PRN

## 2014-07-26 MED ORDER — AMIODARONE HCL 200 MG PO TABS: 200.0000 mg | ORAL_TABLET | Freq: Every day | ORAL | Status: DC

## 2014-07-26 MED ORDER — FISH OIL 1200 MG PO CAPS: 1.0000 | ORAL_CAPSULE | Freq: Every day | ORAL | Status: DC

## 2014-07-26 MED ORDER — ONE-DAILY MULTI VITAMINS PO TABS: 1.0000 | ORAL_TABLET | Freq: Every day | ORAL | Status: DC

## 2014-07-26 NOTE — Telephone Encounter (Signed)
Patient's husband and niece called concerned that her pill box had not been refilled, states someone from [facility] usually does this.  STAT message sent to [facility] with their services since they also cannot find any of her pill bottles.  Refills sent to preferred pharmacy ([facility]) electronically. Renee Pain

## 2014-07-27 DIAGNOSIS — I252 Old myocardial infarction: Secondary | ICD-10-CM | POA: Diagnosis not present

## 2014-07-27 DIAGNOSIS — I714 Abdominal aortic aneurysm, without rupture: Secondary | ICD-10-CM | POA: Diagnosis not present

## 2014-07-27 DIAGNOSIS — Z452 Encounter for adjustment and management of vascular access device: Secondary | ICD-10-CM | POA: Diagnosis not present

## 2014-07-27 DIAGNOSIS — F0390 Unspecified dementia without behavioral disturbance: Secondary | ICD-10-CM | POA: Diagnosis not present

## 2014-07-27 DIAGNOSIS — I2510 Atherosclerotic heart disease of native coronary artery without angina pectoris: Secondary | ICD-10-CM | POA: Diagnosis not present

## 2014-07-27 DIAGNOSIS — I5043 Acute on chronic combined systolic (congestive) and diastolic (congestive) heart failure: Secondary | ICD-10-CM | POA: Diagnosis not present

## 2014-07-29 DIAGNOSIS — Z452 Encounter for adjustment and management of vascular access device: Secondary | ICD-10-CM | POA: Diagnosis not present

## 2014-07-29 DIAGNOSIS — F0390 Unspecified dementia without behavioral disturbance: Secondary | ICD-10-CM | POA: Diagnosis not present

## 2014-07-29 DIAGNOSIS — I252 Old myocardial infarction: Secondary | ICD-10-CM | POA: Diagnosis not present

## 2014-07-29 DIAGNOSIS — I2510 Atherosclerotic heart disease of native coronary artery without angina pectoris: Secondary | ICD-10-CM | POA: Diagnosis not present

## 2014-07-29 DIAGNOSIS — I714 Abdominal aortic aneurysm, without rupture: Secondary | ICD-10-CM | POA: Diagnosis not present

## 2014-07-29 DIAGNOSIS — I5043 Acute on chronic combined systolic (congestive) and diastolic (congestive) heart failure: Secondary | ICD-10-CM | POA: Diagnosis not present

## 2014-08-03 ENCOUNTER — Encounter: Payer: Self-pay | Admitting: Internal Medicine

## 2014-08-04 ENCOUNTER — Telehealth: Payer: Self-pay | Admitting: Cardiology

## 2014-08-04 NOTE — Telephone Encounter (Signed)
Patient's husband called very upset and 'frantic' because he lost his wife's med list sometime after she took her am meds.  They have caregivers during the day that come into the home and help out as well as administer the medications but not the evening ones.  Mr. Ropp was extremely upset.  We went over the medication list that we have on file compared to the pill bottles he had at home.  He kept getting confused regarding the various meds and could not find a bottle for her demadex which she is supposed to be taking twice daily.  He did not know if she takes her pacerone in the am or pm as well.  I tried talking to his wife, but he kept yelling at her while she was trying to talk and she told me should could not talk to me because he was yelling at her.  I spoke with Mr. Slivka again and instructed him to giver her the Eliquis, sertraline and potassium tonight.  He is sure she takes potassium at night.  He will hold on the pacerone since he did not know if she took it this am.  Unfortunately he cannot find the demadex.  I have spoken to the pharmacist on call tonight for [facility] and they are going to try to get in touch with a caregiver tonight to see if they can go over to the patient's house.  Otherwise they will contact the caregiver for the am.  They are also going to contact the social worker in the am about the situation.  Given the situation this evening, I think that some consideration should be given to full time care at night as well.  Unfortunately there was no next of kin listed that could come over to help except an elderly brother and her son who lives in [place].

## 2014-08-05 ENCOUNTER — Telehealth (HOSPITAL_COMMUNITY): Payer: Self-pay | Admitting: Surgery

## 2014-08-05 ENCOUNTER — Telehealth (HOSPITAL_COMMUNITY): Payer: Self-pay | Admitting: Anesthesiology

## 2014-08-05 DIAGNOSIS — I714 Abdominal aortic aneurysm, without rupture: Secondary | ICD-10-CM | POA: Diagnosis not present

## 2014-08-05 DIAGNOSIS — I5043 Acute on chronic combined systolic (congestive) and diastolic (congestive) heart failure: Secondary | ICD-10-CM | POA: Diagnosis not present

## 2014-08-05 DIAGNOSIS — I509 Heart failure, unspecified: Secondary | ICD-10-CM | POA: Diagnosis not present

## 2014-08-05 DIAGNOSIS — Z452 Encounter for adjustment and management of vascular access device: Secondary | ICD-10-CM | POA: Diagnosis not present

## 2014-08-05 DIAGNOSIS — F0390 Unspecified dementia without behavioral disturbance: Secondary | ICD-10-CM | POA: Diagnosis not present

## 2014-08-05 DIAGNOSIS — I252 Old myocardial infarction: Secondary | ICD-10-CM | POA: Diagnosis not present

## 2014-08-05 DIAGNOSIS — I2510 Atherosclerotic heart disease of native coronary artery without angina pectoris: Secondary | ICD-10-CM | POA: Diagnosis not present

## 2014-08-05 NOTE — Telephone Encounter (Signed)
Patient's [facility] nurse called concerned about patient's weight being up 3.2lbs.  She is also very concerned that the patient and patient husband are not able to manage medications at all.  She is going to refill pill box today and reinforce that they not touch the box except for one days pills at at a time.  I will make a referral to [facility] and forward message to Junie Bame NP.

## 2014-08-05 NOTE — Telephone Encounter (Signed)
Called patient with concerns about his mother being at home with her husband. We have gotten [facility] to fill the patients pill box last week d.t the family was having issues. Last night called on call service d/t they did not know what to do with medications and not sure what meds she had taken. When [facility] nurse got to home today she was concerned for the patient's safety and reported medications were messed up again. Son aware and told him mother may need to be in a nursing home since she does not appear safe at home. He reports that he had hired [employer] at home but they kicked them out of the home. He said he will take care of today. Follow up next week in clinic.   Junie Bame B NP-C 1:07 PM

## 2014-08-06 DIAGNOSIS — I5043 Acute on chronic combined systolic (congestive) and diastolic (congestive) heart failure: Secondary | ICD-10-CM | POA: Diagnosis not present

## 2014-08-06 DIAGNOSIS — I714 Abdominal aortic aneurysm, without rupture: Secondary | ICD-10-CM | POA: Diagnosis not present

## 2014-08-06 DIAGNOSIS — Z452 Encounter for adjustment and management of vascular access device: Secondary | ICD-10-CM | POA: Diagnosis not present

## 2014-08-06 DIAGNOSIS — I252 Old myocardial infarction: Secondary | ICD-10-CM | POA: Diagnosis not present

## 2014-08-06 DIAGNOSIS — I2510 Atherosclerotic heart disease of native coronary artery without angina pectoris: Secondary | ICD-10-CM | POA: Diagnosis not present

## 2014-08-06 DIAGNOSIS — F0390 Unspecified dementia without behavioral disturbance: Secondary | ICD-10-CM | POA: Diagnosis not present

## 2014-08-08 ENCOUNTER — Telehealth (HOSPITAL_COMMUNITY): Payer: Self-pay | Admitting: Vascular Surgery

## 2014-08-08 DIAGNOSIS — F0390 Unspecified dementia without behavioral disturbance: Secondary | ICD-10-CM | POA: Diagnosis not present

## 2014-08-08 DIAGNOSIS — I252 Old myocardial infarction: Secondary | ICD-10-CM | POA: Diagnosis not present

## 2014-08-08 DIAGNOSIS — I2510 Atherosclerotic heart disease of native coronary artery without angina pectoris: Secondary | ICD-10-CM | POA: Diagnosis not present

## 2014-08-08 DIAGNOSIS — Z452 Encounter for adjustment and management of vascular access device: Secondary | ICD-10-CM | POA: Diagnosis not present

## 2014-08-08 DIAGNOSIS — I714 Abdominal aortic aneurysm, without rupture: Secondary | ICD-10-CM | POA: Diagnosis not present

## 2014-08-08 DIAGNOSIS — I5043 Acute on chronic combined systolic (congestive) and diastolic (congestive) heart failure: Secondary | ICD-10-CM | POA: Diagnosis not present

## 2014-08-08 NOTE — Telephone Encounter (Signed)
Social worker @ [facility] would like a call back to give update on pt caregiver status..please advise

## 2014-08-08 NOTE — Telephone Encounter (Signed)
Spoke w/Cindy she states they have hired a caregiver from 10 am-3 pm daily but still having issues with confusing meds, pt's wt is up a couple of lbs over last few days, they have decided to keep caregiver 7 days a week and brother will be more involved with helping with meds and RN will teach him to fill pill box, she discussed w/them if they can't handle this at home she will need to be placed in skilled facility, pt has f/u appt with [place] on Wed and will let Dr Haroldine Laws and Amy know about current plan

## 2014-08-10 ENCOUNTER — Ambulatory Visit (HOSPITAL_COMMUNITY)
Admission: RE | Admit: 2014-08-10 | Discharge: 2014-08-10 | Disposition: A | Discharge status: Home / Self Care | Payer: Medicare Other | Source: Ambulatory Visit | Attending: Cardiology | Admitting: Cardiology

## 2014-08-10 ENCOUNTER — Encounter: Payer: Self-pay | Admitting: Licensed Clinical Social Worker

## 2014-08-10 ENCOUNTER — Telehealth: Payer: Self-pay | Admitting: Licensed Clinical Social Worker

## 2014-08-10 VITALS — BP 119/76 | HR 74 | Wt 87.0 lb

## 2014-08-10 DIAGNOSIS — I10 Essential (primary) hypertension: Secondary | ICD-10-CM | POA: Insufficient documentation

## 2014-08-10 DIAGNOSIS — I5020 Unspecified systolic (congestive) heart failure: Secondary | ICD-10-CM | POA: Insufficient documentation

## 2014-08-10 DIAGNOSIS — I739 Peripheral vascular disease, unspecified: Secondary | ICD-10-CM | POA: Diagnosis not present

## 2014-08-10 DIAGNOSIS — Z79899 Other long term (current) drug therapy: Secondary | ICD-10-CM | POA: Insufficient documentation

## 2014-08-10 DIAGNOSIS — R5381 Other malaise: Secondary | ICD-10-CM

## 2014-08-10 DIAGNOSIS — I714 Abdominal aortic aneurysm, without rupture: Secondary | ICD-10-CM | POA: Insufficient documentation

## 2014-08-10 DIAGNOSIS — I5022 Chronic systolic (congestive) heart failure: Secondary | ICD-10-CM | POA: Diagnosis not present

## 2014-08-10 DIAGNOSIS — I480 Paroxysmal atrial fibrillation: Secondary | ICD-10-CM | POA: Insufficient documentation

## 2014-08-10 DIAGNOSIS — I2510 Atherosclerotic heart disease of native coronary artery without angina pectoris: Secondary | ICD-10-CM | POA: Insufficient documentation

## 2014-08-10 DIAGNOSIS — Z955 Presence of coronary angioplasty implant and graft: Secondary | ICD-10-CM | POA: Insufficient documentation

## 2014-08-10 DIAGNOSIS — M6250 Muscle wasting and atrophy, not elsewhere classified, unspecified site: Secondary | ICD-10-CM | POA: Diagnosis not present

## 2014-08-10 DIAGNOSIS — Z7901 Long term (current) use of anticoagulants: Secondary | ICD-10-CM | POA: Diagnosis not present

## 2014-08-10 DIAGNOSIS — I4891 Unspecified atrial fibrillation: Secondary | ICD-10-CM | POA: Insufficient documentation

## 2014-08-10 NOTE — Progress Notes (Signed)
Patient ID: Jacqueline Reilly, female   DOB: 10/08/1924, 80 y.o.   MRN: [id number] PCP: [name]  HPI: [name] is an 80 y/o woman with CAD, AAA, systolic HF, dementia, afib and CAD. She has had two previous coronary interventions including a cutting balloon to her D2 in 2007 and a DES to her mid LCX in 2010. Her LV dysfunction has been out of proportion to her CAD.  Last echo in 7/15 showed EF 20-25% with severe LV dilation, mild MR, and PA systolic pressure 38 mmHg.   Admitted 7/11-7/24/15 with syncope and dyspnea. She had new onset A-fib/RVR. Troponin was 1.03> 1.59> 2.18 . She was placed on amiodarone and heparin drip. She developed respiratory distress and required intubation. She converted to NSR but later was bradycardiac with NSVT. Amiodarone temporarily stopped but restarted after hyperkalemia resolved. Hyperkalemia was thought to be contributing to bradycardia. Diuresed with IV lasix. Co-ox 29% and started on milrinone which we tried to wean off but she did not tolerate. Discharge weight 87 lbs.   She returns for follow up. She has been stable on home milrinone since last appointment.  After review of chart and phone notes. She has had lost all medications at least twice. [facility] has been following to refill pill box and the last time all medications were not present. Weight at home 81- 84 pounds. Says she is taking all medications. Her brother plans to help with medications. No using oxygen. [employer] are helping with ADls and meals.   Labs: (05/23/14) K+ 4.3, creatinine 1.06, digoxin 1.1 (8/15) K 4.7, creatinine 0.94, HCT 33.2 (9/15) K 3.4, creatinine .80 (10/1/0/2015) K 5.0 Creatinine 0.87 Magnesium 2.4   ROS: All systems negative except as listed in HPI, PMH and Problem List.  SH:  History   Social History  . Marital Status: Married    Spouse Name: N/A    Number of Children: 1  . Years of Education: N/A   Occupational History  . retired    Social History Main  Topics  . Smoking status: Never Smoker   . Smokeless tobacco: Never Used  . Alcohol Use: Yes     Comment: occasionally  . Drug Use: No  . Sexual Activity: Not on file   Other Topics Concern  . Not on file   Social History Narrative   She lives in [place], family helps with her care.    FH:  Family History  Problem Relation Age of Onset  . Heart disease Mother   . Heart disease Father   . Heart disease Brother     x 3    Past Medical History  Diagnosis Date  . PVD (peripheral vascular disease)   . Ventricular dysfunction     left; ischemic  . Hyperlipidemia   . AAA (abdominal aortic aneurysm)   . Renal artery stenosis   . Hypertension   . Coronary artery disease 2007    moderate ASCAD of the left system s/p PCI of the D2 and PCI of the left circ 03/2009  . Chronic systolic heart failure     a. ECHO (04/2014) EF 20-25%, diff HK, mild MR  . Atrial fibrillation     Current Outpatient Prescriptions  Medication Sig Dispense Refill  . acetaminophen (TYLENOL) 500 MG tablet Take 1 tablet (500 mg total) by mouth every 6 (six) hours as needed (for pain).  30 tablet  1  . amiodarone (PACERONE) 200 MG tablet Take 1 tablet (200 mg total) by  mouth daily.  30 tablet  6  . apixaban (ELIQUIS) 2.5 MG TABS tablet Take 1 tablet (2.5 mg total) by mouth 2 (two) times daily.  60 tablet  6  . aspirin 81 MG chewable tablet Chew 1 tablet (81 mg total) by mouth daily.  30 tablet  1  . feeding supplement, ENSURE COMPLETE, (ENSURE COMPLETE) LIQD Take 237 mLs by mouth 2 (two) times daily between meals.      . milrinone (PRIMACOR) 20 MG/100ML SOLN infusion Inject 4.95 mcg/min into the vein continuous. Per [facility]  100 mL  3  . Multiple Vitamin (MULTIVITAMIN) tablet Take 1 tablet by mouth daily.  30 tablet  1  . Omega-3 Fatty Acids (FISH OIL) 1200 MG CAPS Take 1 capsule (1,200 mg total) by mouth daily.  30 capsule  1  . potassium chloride SA (KLOR-CON M20) 20 MEQ tablet Take 1 tablet (20  mEq total) by mouth daily.  90 tablet  3  . sertraline (ZOLOFT) 25 MG tablet Take 1 tablet (25 mg total) by mouth at bedtime.  30 tablet  1  . torsemide (DEMADEX) 20 MG tablet Take 2 tablets (40 mg total) by mouth 2 (two) times daily.  120 tablet  3  . nitroGLYCERIN (NITROSTAT) 0.4 MG SL tablet Place 1 tablet (0.4 mg total) under the tongue every 5 (five) minutes as needed for chest pain.  15 tablet  1   No current facility-administered medications for this encounter.    Filed Vitals:   08/10/14 1051  BP: 119/76  Pulse: 74  Weight: 87 lb (39.463 kg)  SpO2: 94%    PHYSICAL EXAM:  General:  Elderly appearing. Frail, No resp difficulty; husband and brother present. Ambulated in the clinic with a walker.  HEENT: normal Neck: supple. JVP flat. Carotids 2+ bilaterally; no bruits. No lymphadenopathy or thryomegaly appreciated. Cor: PMI normal. Regular rate & rhythm. No rubs, gallops.  1/6 SEM RUSB.  Lungs: clear Abdomen: soft, nontender, nondistended. No hepatosplenomegaly. No bruits or masses. Good bowel sounds. Extremities: no cyanosis, clubbing, rash, edema Neuro: alert & orientedx3, cranial nerves grossly intact. Moves all 4 extremities w/o difficulty. Affect pleasant.   ASSESSMENT & PLAN:   1) Chronic Systolic Heart Failure: Mixed ischemic/nonischemic cardiomyopathy (degree of LV dysfunction is out of proportion to CAD), EF 25-30% (04/2014).  She developed acute on chronic systolic CHF when she went into atrial fibrillation with RVR but was unable to titrate off milrinone after going back into NSR.  - NYHA II-III symptoms and volume status stable.  Will continue milrinone 0.25 mcg and torsemide 40 mg BID. Renal function reviewed from [facility] and remain stable.  -Many difficulties with medications have been reported to our office by home health as well as her husband. I have asked them to bring medications to the next appointment. For that reason I will not add or titrate meds because I am  not sure what she is actually taking at home.  Not on beta blocker/Ace at this time with low BP/low output.  - Referred to HF SW due to concern about her home environment and ability to manage medications. She may require placement if she unable to manage at home. The only option for patients on milrinone is [facility] ([facility]  - Reinforced the need and importance of daily weights, a low sodium diet, and fluid restriction (less than 2 L a day). Instructed to call the HF clinic if weight increases more than 3 lbs overnight or 5 lbs  in a week.  2) Atrial fibrillation: Paroxysmal.  Appears to be in NSR.  Continue apixaban 2.5 mg bid (weight less than 60 kg and age >20). Continue amiodarone to 200 mg daily.  Needs yearly eye exams. Check TSH LFTs next visit. Ask [facility] to check.  3) Deconditioning: Continue [facility] for HHPT. 4) Limited Code: No CPR or defibrillation 5) CAD: No chest pain. Continue ASA.    Follow up in 2 weeks to reassess and verify medications. Timica Marcom NP-C  08/10/2014

## 2014-08-10 NOTE — Patient Instructions (Signed)
Follow up 2 weeks    Do the following things EVERYDAY: 1) Weigh yourself in the morning before breakfast. Write it down and keep it in a log. 2) Take your medicines as prescribed 3) Eat low salt foods-Limit salt (sodium) to 2000 mg per day.  4) Stay as active as you can everyday 5) Limit all fluids for the day to less than 2 liters

## 2014-08-11 NOTE — Progress Notes (Signed)
CSW referred to assess home supports due to concerns shared over patient's understanding and support regarding medications and overall home environment. CSW met with patient in the clinic with her husband and brother [name]. Patient currently has [facility] and [place] care agencies involved with her care. She resides at home with her husband and her brother lives 1 mile away. She reports that her brother visits daily to check on her and husband and assist when needed. Brother confirmed daily visits and reports that they have an aide through [employer] that is present in the home from 10 am- 3 pm 7 days a week and assists with light housekeeping, meals, personal care and medication reminders. Patient's brother reports he will be pre pouring medications into med box on Fridays and will follow up daily with patient to assure compliance. Patient's husband interrupted frequently during the interview adding his involvement and appeared confused at times. Husband appears determined to assist with care although patient appears to get frustrated with his involvement as witnessed by eye rolling and comments made by wife during interview. Patient and Brother feel confident with current plan in place that the support needed is sufficient at the moment and will contact CSW if further assistance needed. They agreed to CSW contacting [employer] to confirm aide support and coordination of care. Patient appeared to understand plan and states comfort with plan for daily assistance with Brother and aide service. CSW will continue to be available as needed and will follow up with [employer] agency via phone. Jacqueline Reilly, [place]

## 2014-08-11 NOTE — Telephone Encounter (Signed)
CSW contacted [place] and spoke with Melissa. CSW informed that patient does receive 5 hours a day of aide service 7 days a week. Melissa stated that aide provides med reminders, meal preparation, light housekeeping and personal care. She confirmed concerns about the husband's involvement and concerns of "possible dementia". She will continue care coordination with CSW as needed. CSW will continue to follow and provide support as needed to patient and family. Raquel Sarna, [place]

## 2014-08-12 DIAGNOSIS — Z452 Encounter for adjustment and management of vascular access device: Secondary | ICD-10-CM | POA: Diagnosis not present

## 2014-08-12 DIAGNOSIS — I714 Abdominal aortic aneurysm, without rupture: Secondary | ICD-10-CM | POA: Diagnosis not present

## 2014-08-12 DIAGNOSIS — I509 Heart failure, unspecified: Secondary | ICD-10-CM | POA: Diagnosis not present

## 2014-08-12 DIAGNOSIS — I252 Old myocardial infarction: Secondary | ICD-10-CM | POA: Diagnosis not present

## 2014-08-12 DIAGNOSIS — I5043 Acute on chronic combined systolic (congestive) and diastolic (congestive) heart failure: Secondary | ICD-10-CM | POA: Diagnosis not present

## 2014-08-12 DIAGNOSIS — I2510 Atherosclerotic heart disease of native coronary artery without angina pectoris: Secondary | ICD-10-CM | POA: Diagnosis not present

## 2014-08-12 DIAGNOSIS — F0390 Unspecified dementia without behavioral disturbance: Secondary | ICD-10-CM | POA: Diagnosis not present

## 2014-08-15 NOTE — Telephone Encounter (Signed)
Open in error

## 2014-08-16 DIAGNOSIS — I714 Abdominal aortic aneurysm, without rupture: Secondary | ICD-10-CM | POA: Diagnosis not present

## 2014-08-16 DIAGNOSIS — I252 Old myocardial infarction: Secondary | ICD-10-CM | POA: Diagnosis not present

## 2014-08-16 DIAGNOSIS — Z452 Encounter for adjustment and management of vascular access device: Secondary | ICD-10-CM | POA: Diagnosis not present

## 2014-08-16 DIAGNOSIS — I5043 Acute on chronic combined systolic (congestive) and diastolic (congestive) heart failure: Secondary | ICD-10-CM | POA: Diagnosis not present

## 2014-08-16 DIAGNOSIS — F0390 Unspecified dementia without behavioral disturbance: Secondary | ICD-10-CM | POA: Diagnosis not present

## 2014-08-16 DIAGNOSIS — I2510 Atherosclerotic heart disease of native coronary artery without angina pectoris: Secondary | ICD-10-CM | POA: Diagnosis not present

## 2014-08-18 ENCOUNTER — Encounter: Payer: Self-pay | Admitting: Internal Medicine

## 2014-08-19 ENCOUNTER — Encounter: Payer: Self-pay | Admitting: Internal Medicine

## 2014-08-19 ENCOUNTER — Telehealth (HOSPITAL_COMMUNITY): Payer: Self-pay | Admitting: *Deleted

## 2014-08-19 ENCOUNTER — Encounter: Payer: Self-pay | Admitting: Adult Health

## 2014-08-19 DIAGNOSIS — I2510 Atherosclerotic heart disease of native coronary artery without angina pectoris: Secondary | ICD-10-CM | POA: Diagnosis not present

## 2014-08-19 DIAGNOSIS — I252 Old myocardial infarction: Secondary | ICD-10-CM | POA: Diagnosis not present

## 2014-08-19 DIAGNOSIS — F0390 Unspecified dementia without behavioral disturbance: Secondary | ICD-10-CM | POA: Diagnosis not present

## 2014-08-19 DIAGNOSIS — I509 Heart failure, unspecified: Secondary | ICD-10-CM | POA: Diagnosis not present

## 2014-08-19 DIAGNOSIS — Z452 Encounter for adjustment and management of vascular access device: Secondary | ICD-10-CM | POA: Diagnosis not present

## 2014-08-19 DIAGNOSIS — I714 Abdominal aortic aneurysm, without rupture: Secondary | ICD-10-CM | POA: Diagnosis not present

## 2014-08-19 DIAGNOSIS — I5043 Acute on chronic combined systolic (congestive) and diastolic (congestive) heart failure: Secondary | ICD-10-CM | POA: Diagnosis not present

## 2014-08-19 NOTE — Telephone Encounter (Signed)
Pt's brother has called back and states he is with pt and is trying to get her medications straightened out, he states he is unsure if she has been taking meds correctly or not, we have reviewed medication list in detail and he is going to fill pill box according, he will give her PM dose of Torsemide now for swelling and have her elevated feet this evening

## 2014-08-19 NOTE — Telephone Encounter (Signed)
Received call from pt's husband he is concerned about pt and states he right ankle is very swollen, he is unsure if she is having any SOB or not and took the phone to pt, she was upset that husband had called the clinic, pt's brother was also at the home, they argued slightly in the back ground and pt states "I can not talk right now" and hung up the phone, have attempted to call pt back and got answering machine, left mess for her to please call the clinic back, will attempt to reach her later

## 2014-08-24 ENCOUNTER — Encounter: Payer: Self-pay | Admitting: Internal Medicine

## 2014-08-25 DIAGNOSIS — Z452 Encounter for adjustment and management of vascular access device: Secondary | ICD-10-CM | POA: Diagnosis not present

## 2014-08-25 DIAGNOSIS — I5043 Acute on chronic combined systolic (congestive) and diastolic (congestive) heart failure: Secondary | ICD-10-CM | POA: Diagnosis not present

## 2014-08-25 DIAGNOSIS — I2510 Atherosclerotic heart disease of native coronary artery without angina pectoris: Secondary | ICD-10-CM | POA: Diagnosis not present

## 2014-08-25 DIAGNOSIS — I252 Old myocardial infarction: Secondary | ICD-10-CM | POA: Diagnosis not present

## 2014-08-25 DIAGNOSIS — F0390 Unspecified dementia without behavioral disturbance: Secondary | ICD-10-CM | POA: Diagnosis not present

## 2014-08-25 DIAGNOSIS — I714 Abdominal aortic aneurysm, without rupture: Secondary | ICD-10-CM | POA: Diagnosis not present

## 2014-08-25 DIAGNOSIS — I509 Heart failure, unspecified: Secondary | ICD-10-CM | POA: Diagnosis not present

## 2014-08-26 ENCOUNTER — Telehealth (HOSPITAL_COMMUNITY): Payer: Self-pay | Admitting: Vascular Surgery

## 2014-08-26 NOTE — Telephone Encounter (Signed)
Verbal order to be seen for another 60 days for CHF management and Milranone bag changes.. Please advise

## 2014-08-26 NOTE — Telephone Encounter (Signed)
Detailed message left with verbal orders to continue seeing patient CHF management and milrinone bag changes

## 2014-08-29 ENCOUNTER — Encounter (HOSPITAL_COMMUNITY): Payer: Self-pay

## 2014-08-29 ENCOUNTER — Ambulatory Visit (HOSPITAL_COMMUNITY)
Admission: RE | Admit: 2014-08-29 | Discharge: 2014-08-29 | Disposition: A | Discharge status: Home / Self Care | Payer: Medicare Other | Source: Ambulatory Visit | Attending: Internal Medicine | Admitting: Internal Medicine

## 2014-08-29 VITALS — BP 124/76 | HR 79 | Wt 88.8 lb

## 2014-08-29 DIAGNOSIS — I5022 Chronic systolic (congestive) heart failure: Secondary | ICD-10-CM | POA: Diagnosis not present

## 2014-08-29 DIAGNOSIS — I2510 Atherosclerotic heart disease of native coronary artery without angina pectoris: Secondary | ICD-10-CM | POA: Insufficient documentation

## 2014-08-29 DIAGNOSIS — I480 Paroxysmal atrial fibrillation: Secondary | ICD-10-CM | POA: Insufficient documentation

## 2014-08-29 DIAGNOSIS — I4891 Unspecified atrial fibrillation: Secondary | ICD-10-CM

## 2014-08-29 MED ORDER — AMIODARONE HCL 200 MG PO TABS: 200.0000 mg | ORAL_TABLET | Freq: Every day | ORAL | Status: DC

## 2014-08-29 NOTE — Patient Instructions (Signed)
Your physician recommends that you schedule a follow-up appointment in: 4 weeks  Do the following things EVERYDAY: 1) Weigh yourself in the morning before breakfast. Write it down and keep it in a log. 2) Take your medicines as prescribed 3) Eat low salt foods-Limit salt (sodium) to 2000 mg per day.  4) Stay as active as you can everyday 5) Limit all fluids for the day to less than 2 liters 6)

## 2014-08-29 NOTE — Progress Notes (Signed)
Patient ID: [name], female   DOB: 10/21/1924, 80 y.o.   MRN: [id number] PCP: [name]  HPI: Ms. Kawa Margarita Grizzle) is an 80 y/o woman with CAD, AAA, systolic HF, dementia, afib and CAD. She has had two previous coronary interventions including a cutting balloon to her D2 in 2007 and a DES to her mid LCX in 2010. Her LV dysfunction has been out of proportion to her CAD.  Last echo in 7/15 showed EF 20-25% with severe LV dilation, mild MR, and PA systolic pressure 38 mmHg.   Admitted 7/11-7/24/15 with syncope and dyspnea. She had new onset A-fib/RVR. Troponin was 1.03> 1.59> 2.18 . She was placed on amiodarone and heparin drip. She developed respiratory distress and required intubation. She converted to NSR but later was bradycardiac with NSVT. Amiodarone temporarily stopped but restarted after hyperkalemia resolved. Hyperkalemia was thought to be contributing to bradycardia. Diuresed with IV lasix. Co-ox 29% and started on milrinone which we tried to wean off but she did not tolerate. Discharge weight 87 lbs.   She returns for follow up. She has been stable on home milrinone since last appointment. Recently they have had a lot of confusion with her medication regimen but her brother has now straightened it out for her. [facility] is following for [facility]one and labs. Marthenia Rolling from [facility] has also offered her services. Says she feels pretty good. Breathing is ok. Swelling has gone down. Weight at home 82- 85 pounds. Says she is taking all medications. [name] are helping with ADLs and meals. BP running anywhere 90-150.   Labs: (05/23/14) K+ 4.3, creatinine 1.06, digoxin 1.1 (8/15) K 4.7, creatinine 0.94, HCT 33.2 (9/15) K 3.4, creatinine .80 (10/1/0/2015) K 5.0 Creatinine 0.87 Magnesium 2.4  08/11/14 K 5.3 Creatinine 0.8 08/25/14 K 4.4 Cr 0.85  ROS: All systems negative except as listed in HPI, PMH and Problem List.  SH:  History   Social History  . Marital Status: Married    Spouse Name:  N/A    Number of Children: 1  . Years of Education: N/A   Occupational History  . retired    Social History Main Topics  . Smoking status: Never Smoker   . Smokeless tobacco: Never Used  . Alcohol Use: Yes     Comment: occasionally  . Drug Use: No  . Sexual Activity: Not on file   Other Topics Concern  . Not on file   Social History Narrative   She lives in [place], family helps with her care.    FH:  Family History  Problem Relation Age of Onset  . Heart disease Mother   . Heart disease Father   . Heart disease Brother     x 3    Past Medical History  Diagnosis Date  . PVD (peripheral vascular disease)   . Ventricular dysfunction     left; ischemic  . Hyperlipidemia   . AAA (abdominal aortic aneurysm)   . Renal artery stenosis   . Hypertension   . Coronary artery disease 2007    moderate ASCAD of the left system s/p PCI of the D2 and PCI of the left circ 03/2009  . Chronic systolic heart failure     a. ECHO (04/2014) EF 20-25%, diff HK, mild MR  . Atrial fibrillation     Current Outpatient Prescriptions  Medication Sig Dispense Refill  . amiodarone (PACERONE) 200 MG tablet Take 1 tablet (200 mg total) by mouth daily. 30 tablet 6  . apixaban (  ELIQUIS) 2.5 MG TABS tablet Take 1 tablet (2.5 mg total) by mouth 2 (two) times daily. 60 tablet 6  . milrinone (PRIMACOR) 20 MG/100ML SOLN infusion Inject 4.95 mcg/min into the vein continuous. Per [facility] 100 mL 3  . potassium chloride SA (KLOR-CON M20) 20 MEQ tablet Take 1 tablet (20 mEq total) by mouth daily. 90 tablet 3  . sertraline (ZOLOFT) 25 MG tablet Take 1 tablet (25 mg total) by mouth at bedtime. 30 tablet 1  . torsemide (DEMADEX) 20 MG tablet Take 2 tablets (40 mg total) by mouth 2 (two) times daily. 120 tablet 3  . acetaminophen (TYLENOL) 500 MG tablet Take 1 tablet (500 mg total) by mouth every 6 (six) hours as needed (for pain). 30 tablet 1  . aspirin 81 MG chewable tablet Chew 1 tablet (81 mg  total) by mouth daily. 30 tablet 1  . feeding supplement, ENSURE COMPLETE, (ENSURE COMPLETE) LIQD Take 237 mLs by mouth 2 (two) times daily between meals.    . Multiple Vitamin (MULTIVITAMIN) tablet Take 1 tablet by mouth daily. 30 tablet 1  . nitroGLYCERIN (NITROSTAT) 0.4 MG SL tablet Place 1 tablet (0.4 mg total) under the tongue every 5 (five) minutes as needed for chest pain. 15 tablet 1  . Omega-3 Fatty Acids (FISH OIL) 1200 MG CAPS Take 1 capsule (1,200 mg total) by mouth daily. 30 capsule 1   No current facility-administered medications for this encounter.    Filed Vitals:   08/29/14 1151  BP: 124/76  Pulse: 79  Weight: 88 lb 12.8 oz (40.279 kg)  SpO2: 92%    PHYSICAL EXAM:  General:  Elderly appearing. Frail, No resp difficulty; husband and brother present. Ambulated in the clinic with a walker.  HEENT: normal Neck: supple. JVP 5-6 Carotids 2+ bilaterally; no bruits. No lymphadenopathy or thryomegaly appreciated. Cor: PMI normal. Regular rate & rhythm. No rubs, gallops.  2/6 SEM apex Lungs: clear Abdomen: soft, nontender, nondistended. No hepatosplenomegaly. No bruits or masses. Good bowel sounds. Extremities: no cyanosis, clubbing, rash, edema LUE PICC Neuro: alert & orientedx3, cranial nerves grossly intact. Moves all 4 extremities w/o difficulty. Affect pleasant.   ASSESSMENT & PLAN:   1) Chronic Systolic Heart Failure: Mixed ischemic/nonischemic cardiomyopathy (degree of LV dysfunction is out of proportion to CAD), EF 25-30% (04/2014).  - NYHA II-III symptoms and volume status stable on milrinone. She is actually much improved from the last time I saw her.  - Medication issues seemed to have resolved with her brother's assistance.  - Her BP is slightly up at times. We considered adding back low-dose lisinopril but given recent hyperkalemia and risk of hypotension will not do that at this time. Will continue to follow BP  - Not on beta blocker/Ace at this time with low   output.  - Reinforced the need and importance of daily weights, a low sodium diet, and fluid restriction (less than 2 L a day). Instructed to call the HF clinic if weight increases more than 3 lbs overnight or 5 lbs in a week.  - Continue [facility] support. I think Atika from [facility] can help as well.  2) Atrial fibrillation: Paroxysmal.  Appears to be in NSR.  Continue apixaban 2.5 mg bid (weight less than 60 kg and age >49). Continue amiodarone to 200 mg daily.  Needs yearly eye exams. Check TSH LFTs with [facility].  3) Deconditioning: Continue [facility] for HHPT. 4) Limited Code: No CPR or defibrillation 5) CAD: No chest pain. Can  stop ASA with Eliquis.    Follow up in 4 weeks  Glori Bickers MD  08/29/2014

## 2014-08-29 NOTE — Addendum Note (Signed)
Encounter addended by: Kerry Dory, CMA on: 08/29/2014 12:30 PM<BR>     Documentation filed: Patient Instructions Section

## 2014-08-29 NOTE — Addendum Note (Signed)
Encounter addended by: Kerry Dory, CMA on: 08/29/2014  3:22 PM<BR>     Documentation filed: [employer], Orders

## 2014-08-30 DIAGNOSIS — F0390 Unspecified dementia without behavioral disturbance: Secondary | ICD-10-CM | POA: Diagnosis not present

## 2014-08-30 DIAGNOSIS — I10 Essential (primary) hypertension: Secondary | ICD-10-CM | POA: Diagnosis not present

## 2014-08-30 DIAGNOSIS — I252 Old myocardial infarction: Secondary | ICD-10-CM | POA: Diagnosis not present

## 2014-08-30 DIAGNOSIS — I714 Abdominal aortic aneurysm, without rupture: Secondary | ICD-10-CM | POA: Diagnosis not present

## 2014-08-30 DIAGNOSIS — Z452 Encounter for adjustment and management of vascular access device: Secondary | ICD-10-CM | POA: Diagnosis not present

## 2014-08-30 DIAGNOSIS — I2510 Atherosclerotic heart disease of native coronary artery without angina pectoris: Secondary | ICD-10-CM | POA: Diagnosis not present

## 2014-08-30 DIAGNOSIS — I5043 Acute on chronic combined systolic (congestive) and diastolic (congestive) heart failure: Secondary | ICD-10-CM | POA: Diagnosis not present

## 2014-08-30 DIAGNOSIS — I4891 Unspecified atrial fibrillation: Secondary | ICD-10-CM | POA: Diagnosis not present

## 2014-09-02 DIAGNOSIS — F0390 Unspecified dementia without behavioral disturbance: Secondary | ICD-10-CM | POA: Diagnosis not present

## 2014-09-02 DIAGNOSIS — I509 Heart failure, unspecified: Secondary | ICD-10-CM | POA: Diagnosis not present

## 2014-09-02 DIAGNOSIS — I2510 Atherosclerotic heart disease of native coronary artery without angina pectoris: Secondary | ICD-10-CM | POA: Diagnosis not present

## 2014-09-02 DIAGNOSIS — Z452 Encounter for adjustment and management of vascular access device: Secondary | ICD-10-CM | POA: Diagnosis not present

## 2014-09-02 DIAGNOSIS — I252 Old myocardial infarction: Secondary | ICD-10-CM | POA: Diagnosis not present

## 2014-09-02 DIAGNOSIS — I714 Abdominal aortic aneurysm, without rupture: Secondary | ICD-10-CM | POA: Diagnosis not present

## 2014-09-02 DIAGNOSIS — I5043 Acute on chronic combined systolic (congestive) and diastolic (congestive) heart failure: Secondary | ICD-10-CM | POA: Diagnosis not present

## 2014-09-04 ENCOUNTER — Telehealth: Payer: Self-pay | Admitting: Physician Assistant

## 2014-09-04 ENCOUNTER — Encounter (HOSPITAL_COMMUNITY): Payer: Self-pay | Admitting: *Deleted

## 2014-09-04 ENCOUNTER — Emergency Department (HOSPITAL_COMMUNITY): Payer: Medicare Other

## 2014-09-04 ENCOUNTER — Emergency Department (HOSPITAL_COMMUNITY)
Admission: EM | Admit: 2014-09-04 | Discharge: 2014-09-04 | Disposition: A | Discharge status: Home / Self Care | Payer: Medicare Other | Attending: Emergency Medicine | Admitting: Emergency Medicine

## 2014-09-04 DIAGNOSIS — I2510 Atherosclerotic heart disease of native coronary artery without angina pectoris: Secondary | ICD-10-CM | POA: Diagnosis not present

## 2014-09-04 DIAGNOSIS — I5022 Chronic systolic (congestive) heart failure: Secondary | ICD-10-CM | POA: Insufficient documentation

## 2014-09-04 DIAGNOSIS — Z79899 Other long term (current) drug therapy: Secondary | ICD-10-CM | POA: Diagnosis not present

## 2014-09-04 DIAGNOSIS — Z8639 Personal history of other endocrine, nutritional and metabolic disease: Secondary | ICD-10-CM | POA: Insufficient documentation

## 2014-09-04 DIAGNOSIS — I509 Heart failure, unspecified: Secondary | ICD-10-CM | POA: Diagnosis not present

## 2014-09-04 DIAGNOSIS — Z9861 Coronary angioplasty status: Secondary | ICD-10-CM | POA: Diagnosis not present

## 2014-09-04 DIAGNOSIS — R918 Other nonspecific abnormal finding of lung field: Secondary | ICD-10-CM | POA: Diagnosis not present

## 2014-09-04 DIAGNOSIS — Z7902 Long term (current) use of antithrombotics/antiplatelets: Secondary | ICD-10-CM | POA: Diagnosis not present

## 2014-09-04 DIAGNOSIS — I5030 Unspecified diastolic (congestive) heart failure: Secondary | ICD-10-CM | POA: Diagnosis not present

## 2014-09-04 DIAGNOSIS — I10 Essential (primary) hypertension: Secondary | ICD-10-CM | POA: Diagnosis not present

## 2014-09-04 DIAGNOSIS — M7989 Other specified soft tissue disorders: Secondary | ICD-10-CM

## 2014-09-04 DIAGNOSIS — Z7982 Long term (current) use of aspirin: Secondary | ICD-10-CM | POA: Insufficient documentation

## 2014-09-04 DIAGNOSIS — M799 Soft tissue disorder, unspecified: Secondary | ICD-10-CM | POA: Diagnosis not present

## 2014-09-04 DIAGNOSIS — I517 Cardiomegaly: Secondary | ICD-10-CM | POA: Diagnosis not present

## 2014-09-04 LAB — CBC WITH DIFFERENTIAL/PLATELET
Basophils Absolute: 0 10*3/uL (ref 0.0–0.1)
Basophils Relative: 0 % (ref 0–1)
Eosinophils Absolute: 0.2 10*3/uL (ref 0.0–0.7)
Eosinophils Relative: 2 % (ref 0–5)
HCT: 34.8 % — ABNORMAL LOW (ref 36.0–46.0)
Hemoglobin: 10.5 g/dL — ABNORMAL LOW (ref 12.0–15.0)
Lymphocytes Relative: 16 % (ref 12–46)
Lymphs Abs: 1.4 10*3/uL (ref 0.7–4.0)
MCH: 24.5 pg — ABNORMAL LOW (ref 26.0–34.0)
MCHC: 30.2 g/dL (ref 30.0–36.0)
MCV: 81.3 fL (ref 78.0–100.0)
Monocytes Absolute: 0.7 10*3/uL (ref 0.1–1.0)
Monocytes Relative: 8 % (ref 3–12)
Neutro Abs: 6.4 10*3/uL (ref 1.7–7.7)
Neutrophils Relative %: 74 % (ref 43–77)
Platelets: 382 10*3/uL (ref 150–400)
RBC: 4.28 MIL/uL (ref 3.87–5.11)
RDW: 15.4 % (ref 11.5–15.5)
WBC: 8.6 10*3/uL (ref 4.0–10.5)

## 2014-09-04 LAB — COMPREHENSIVE METABOLIC PANEL
ALT: 17 U/L (ref 0–35)
AST: 25 U/L (ref 0–37)
Albumin: 3.3 g/dL — ABNORMAL LOW (ref 3.5–5.2)
Alkaline Phosphatase: 98 U/L (ref 39–117)
Anion gap: 15 (ref 5–15)
BUN: 21 mg/dL (ref 6–23)
CO2: 28 mEq/L (ref 19–32)
Calcium: 9.3 mg/dL (ref 8.4–10.5)
Chloride: 94 mEq/L — ABNORMAL LOW (ref 96–112)
Creatinine, Ser: 0.84 mg/dL (ref 0.50–1.10)
GFR calc Af Amer: 70 mL/min — ABNORMAL LOW (ref >90)
GFR calc non Af Amer: 60 mL/min — ABNORMAL LOW (ref >90)
Glucose, Bld: 91 mg/dL (ref 70–99)
Potassium: 4 mEq/L (ref 3.7–5.3)
Sodium: 137 mEq/L (ref 137–147)
Total Bilirubin: <0.2 mg/dL — ABNORMAL LOW (ref 0.3–1.2)
Total Protein: 7.5 g/dL (ref 6.0–8.3)

## 2014-09-04 LAB — PRO B NATRIURETIC PEPTIDE: Pro B Natriuretic peptide (BNP): 1763 pg/mL — ABNORMAL HIGH (ref 0–450)

## 2014-09-04 LAB — I-STAT TROPONIN, ED: Troponin i, poc: 0.03 ng/mL (ref 0.00–0.08)

## 2014-09-04 LAB — TROPONIN I: Troponin I: <0.3 ng/mL (ref <0.30)

## 2014-09-04 NOTE — Telephone Encounter (Signed)
Contacted by Archer Asa regarding patient's weight gain and worsening LE edema. He is not sure if patient is SOB at rest now, however states she is in a lot of LE pain due to swelling.   I have instructed the patient's family that if she is not SOB at rest, they should given her additional 40mg  torsemide today and tomorrow on top of her 40mg  BID torsemide. However if she is SOB at rest, she should come to the hospital.  Family is uncomfortable taking care of her current condition at home, and states they feel better if she can be seen in the ED. Once she arrive in ED, please contact cardiology if she appears to be very fluid overloaded.  Hilbert Corrigan PA Pager: [phone]

## 2014-09-04 NOTE — ED Provider Notes (Signed)
CSN: [id number]     Arrival date & time 09/04/14  1201 History   First MD Initiated Contact with Patient 09/04/14 1211     Chief Complaint  Patient presents with  . Leg Swelling     (Consider location/radiation/quality/duration/timing/severity/associated sxs/prior Treatment) Patient is a 80 y.o. female presenting with general illness. The history is provided by the patient. No language interpreter was used.  Illness Location:  Right lower leg Quality:  Swelling Severity:  Moderate Onset quality:  Gradual Duration:  1 day Timing:  Constant Progression:  Unchanged Chronicity:  New Context:  History of systolic heart failure, last EF 25-30%. concern for right leg swelling. no shortness of breath, orthopnea at baseline. no chest pain no recent illness. no trauma or injury Relieved by:  Nothing Worsened by:  Nothing Ineffective treatments:  None tried Associated symptoms: no abdominal pain, no chest pain, no congestion, no cough, no diarrhea, no fever, no headaches, no nausea, no rash, no rhinorrhea, no shortness of breath, no sore throat and no vomiting   Risk factors:  CHF, CAD   Past Medical History  Diagnosis Date  . PVD (peripheral vascular disease)   . Ventricular dysfunction     left; ischemic  . Hyperlipidemia   . AAA (abdominal aortic aneurysm)   . Renal artery stenosis   . Hypertension   . Coronary artery disease 2007    moderate ASCAD of the left system s/p PCI of the D2 and PCI of the left circ 03/2009  . Chronic systolic heart failure     a. ECHO (04/2014) EF 20-25%, diff HK, mild MR  . Atrial fibrillation    Past Surgical History  Procedure Laterality Date  . Retinal cryopexy      right eye (for retinal detachment)  . Angioplasty    . Coronary stent placement     Family History  Problem Relation Age of Onset  . Heart disease Mother   . Heart disease Father   . Heart disease Brother     x 3   History  Substance Use Topics  . Smoking status: Never  Smoker   . Smokeless tobacco: Never Used  . Alcohol Use: Yes     Comment: occasionally   OB History    No data available     Review of Systems  Constitutional: Negative for fever.  HENT: Negative for congestion, rhinorrhea and sore throat.   Respiratory: Negative for cough and shortness of breath.   Cardiovascular: Negative for chest pain.  Gastrointestinal: Negative for nausea, vomiting, abdominal pain and diarrhea.  Genitourinary: Negative for dysuria and hematuria.  Skin: Negative for rash.  Neurological: Negative for syncope, light-headedness and headaches.  All other systems reviewed and are negative.     Allergies  Codeine and Garlic  Home Medications   Prior to Admission medications   Medication Sig Start Date End Date Taking? Authorizing Provider  acetaminophen (TYLENOL) 500 MG tablet Take 1 tablet (500 mg total) by mouth every 6 (six) hours as needed (for pain). 07/26/14   Larey Dresser, MD  amiodarone (PACERONE) 200 MG tablet Take 1 tablet (200 mg total) by mouth daily. 08/29/14   Jolaine Artist, MD  apixaban (ELIQUIS) 2.5 MG TABS tablet Take 1 tablet (2.5 mg total) by mouth 2 (two) times daily. 07/26/14   Larey Dresser, MD  aspirin 81 MG chewable tablet Chew 1 tablet (81 mg total) by mouth daily. 07/26/14   Larey Dresser, MD  feeding supplement, ENSURE COMPLETE, (  ENSURE COMPLETE) LIQD Take 237 mLs by mouth 2 (two) times daily between meals. 05/20/14   Rhonda G Barrett, PA-C  milrinone (PRIMACOR) 20 MG/100ML SOLN infusion Inject 4.95 mcg/min into the vein continuous. Per [facility] 05/20/14   Evelene Croon Barrett, PA-C  Multiple Vitamin (MULTIVITAMIN) tablet Take 1 tablet by mouth daily. 07/26/14   Larey Dresser, MD  nitroGLYCERIN (NITROSTAT) 0.4 MG SL tablet Place 1 tablet (0.4 mg total) under the tongue every 5 (five) minutes as needed for chest pain. 07/26/14   Larey Dresser, MD  Omega-3 Fatty Acids (FISH OIL) 1200 MG CAPS Take 1 capsule (1,200 mg total)  by mouth daily. 07/26/14   Larey Dresser, MD  potassium chloride SA (KLOR-CON M20) 20 MEQ tablet Take 1 tablet (20 mEq total) by mouth daily. 07/26/14   Larey Dresser, MD  sertraline (ZOLOFT) 25 MG tablet Take 1 tablet (25 mg total) by mouth at bedtime. 07/26/14   Larey Dresser, MD  torsemide (DEMADEX) 20 MG tablet Take 2 tablets (40 mg total) by mouth 2 (two) times daily. 07/26/14   Larey Dresser, MD   BP 121/78 mmHg  Pulse 74  Temp(Src) 97.8 F (36.6 C) (Oral)  Resp 18  SpO2 96% Physical Exam  Constitutional: She is oriented to person, place, and time. She appears well-developed and well-nourished.  cahectic  HENT:  Head: Normocephalic and atraumatic.  Right Ear: External ear normal.  Left Ear: External ear normal.  Eyes: EOM are normal.  Neck: Normal range of motion. Neck supple. JVD present.  Cardiovascular: Normal rate, regular rhythm and intact distal pulses.  Exam reveals no gallop and no friction rub.   No murmur heard. Pulmonary/Chest: Effort normal and breath sounds normal. No respiratory distress. She has no wheezes. She has no rales. She exhibits no tenderness.  Abdominal: Soft. Bowel sounds are normal. She exhibits no distension. There is no tenderness. There is no rebound.  Musculoskeletal: Normal range of motion. She exhibits edema (right leg and ankle). She exhibits no tenderness.  Lymphadenopathy:    She has no cervical adenopathy.  Neurological: She is alert and oriented to person, place, and time.  Skin: Skin is warm. No rash noted.  Psychiatric: She has a normal mood and affect. Her behavior is normal.  Nursing note and vitals reviewed.   ED Course  Procedures (including critical care time) Labs Review Labs Reviewed  PRO B NATRIURETIC PEPTIDE - Abnormal; Notable for the following:    Pro B Natriuretic peptide (BNP) 1763.0 (*)    All other components within normal limits  CBC WITH DIFFERENTIAL - Abnormal; Notable for the following:    Hemoglobin 10.5  (*)    HCT 34.8 (*)    MCH 24.5 (*)    All other components within normal limits  COMPREHENSIVE METABOLIC PANEL - Abnormal; Notable for the following:    Chloride 94 (*)    Albumin 3.3 (*)    Total Bilirubin <0.2 (*)    GFR calc non Af Amer 60 (*)    GFR calc Af Amer 70 (*)    All other components within normal limits  TROPONIN I  Randolm Idol, ED    Imaging Review Dg Chest 2 View  09/04/2014   CLINICAL DATA:  Leg swelling. Shortness of breath. Initial encounter.  EXAM: CHEST  2 VIEW  COMPARISON:  05/14/2014; 05/10/2014; 05/07/2014  FINDINGS: Grossly unchanged enlarged cardiac silhouette and mediastinal contours. Interval placement of a left upper extremity approach PICC  line with tip projected of the superior cavoatrial junction. The lungs remain hyper expanded with diffuse slightly nodular thickening of the pulmonary interstitium. Unchanged granulomas within the peripheral aspect of the left lower lung. No new focal airspace opacities. No pleural effusion pneumothorax. No definite evidence of edema. No acute osseus abnormalities. Advanced degenerative change of the bilateral glenohumeral joints with associated suspected intra-articular loose bodies, left greater than right.  IMPRESSION: 1. Hyperexpanded lungs and bronchitic change without acute cardiopulmonary disease. 2. Cardiomegaly without evidence of edema. 3. Sequela of prior granulomatous disease.   Electronically Signed   By: Sandi Mariscal M.D.   On: 09/04/2014 13:28     EKG Interpretation   Date/Time:  Sunday September 04 2014 12:25:28 EST Ventricular Rate:  70 PR Interval:  201 QRS Duration: 150 QT Interval:  475 QTC Calculation: 513 R Axis:   -41 Text Interpretation:  Sinus rhythm Left bundle branch block Artifact in  lead(s) I III aVL and baseline wander in lead(s) V2 No significant change  since last tracing Confirmed by HARRISON  MD, FORREST ([id number]) on 09/04/2014  12:32:56 PM      MDM   Final diagnoses:  None     12 :11 PM Pt is a 80 y.o. female with pertinent PMHX of CAD, AAA, systolic HF las EF 0000000 EF 25-30%, dementia, afib who presents to the ED with right lower leg swelling for the past day. Patient called cardiology who recommended increase in torsemide. Patient not comfortable with plan and was referred to ED for evaluation. No shortness of breath. Baseline orthopnea without worsening symptoms. No PND. No fevers or recent illness. No fevers. No previous DVT or PE. No chest pains. No abdominal pain. No nausea, vomiting. No URI symptoms. No missed doses of medications  On exam: cahectic. Lungs course bilaterally. Unilateral right leg swelling intact distal pulses. Warm. No obvious signs of overload on exam. Plan for labs CXR to rule out possible overload given history of severe heart failre on milrinone. Will also obtain right lower extremity doppler to rule out DVT given unilateral leg swelling  EKG personally reviewed by myself showed NSR, LBBB, artifact in I, III, AvL Rate of 70, PR 236ms, QRS 133ms QT/QTC 475/527ms, normal axis, without evidence of new ischemia. Comparison showed similar, indication: leg swelling  CXR PA/LAT: no focal consolidation, no ptx  Review of labs: istat troponin: 0.03 BNP: 1763.0 CBC: no leukocytosis, H&H 10.5/34.8 CMP: hypochloremia, no elevated LFTs Troponin: <0.30  Pending right lower extremity doppler  Lower extremity doppler negative for DVT  Discussed with cardiology: feel patient is safe for discharge: no hypoxia, no evidence of acute overload. No DVT. Plan for discharge with close follow up with cardiology tomorrow. Patient to take an extra torsamide tonight and follow up closely. Strict return precautions given  3:53 PM:  I have discussed the diagnosis/risks/treatment options with the patient and believe the pt to be eligible for discharge home to follow-up with cardiologist tomorrow. We also discussed returning to the ED immediately if new or worsening  sx occur. We discussed the sx which are most concerning (e.g., worsening symptoms) that necessitate immediate return. Any new prescriptions provided to the patient are listed below.   New Prescriptions   No medications on file    The patient appears reasonably screened and/or stabilized for discharge and I doubt any other medical condition or other [facility] requiring further screening, evaluation or treatment in the ED at this time prior to discharge . Pt in agreement with discharge  plan. Return precautions given. Pt discharged VSS   Labs, EKG and imaging reviewed by myself and considered in medical decision making if ordered.  Imaging interpreted by radiology. Pt was discussed with my attending, Dr. Missy Sabins, MD 09/04/14 [place], MD 09/04/14 [phone]

## 2014-09-04 NOTE — Progress Notes (Signed)
VASCULAR LAB PRELIMINARY  PRELIMINARY  PRELIMINARY  PRELIMINARY  Right lower extremity venous Doppler completed.    Preliminary report:  There is no DVT or SVT noted in the right lower extremity.   Kadesia Robel, RVT 09/04/2014, 3:16 PM

## 2014-09-04 NOTE — Discharge Instructions (Signed)
1. Take an extra dose of torsamide 20 mg or 40 mg, whichever you feel comfortable with 2. See your cardiologist tomorrow 3. Come back if worsening symptoms Edema Edema is an abnormal buildup of fluids in your bodytissues. Edema is somewhatdependent on gravity to pull the fluid to the lowest place in your body. That makes the condition more common in the legs and thighs (lower extremities). Painless swelling of the feet and ankles is common and becomes more likely as you get older. It is also common in looser tissues, like around your eyes.  When the affected area is squeezed, the fluid may move out of that spot and leave a dent for a few moments. This dent is called pitting.  CAUSES  There are many possible causes of edema. Eating too much salt and being on your feet or sitting for a long time can cause edema in your legs and ankles. Hot weather may make edema worse. Common medical causes of edema include:  Heart failure.  Liver disease.  Kidney disease.  Weak blood vessels in your legs.  Cancer.  An injury.  Pregnancy.  Some medications.  Obesity. SYMPTOMS  Edema is usually painless.Your skin may look swollen or shiny.  DIAGNOSIS  Your health care provider may be able to diagnose edema by asking about your medical history and doing a physical exam. You may need to have tests such as X-rays, an electrocardiogram, or blood tests to check for medical conditions that may cause edema.  TREATMENT  Edema treatment depends on the cause. If you have heart, liver, or kidney disease, you need the treatment appropriate for these conditions. General treatment may include:  Elevation of the affected body part above the level of your heart.  Compression of the affected body part. Pressure from elastic bandages or support stockings squeezes the tissues and forces fluid back into the blood vessels. This keeps fluid from entering the tissues.  Restriction of fluid and salt intake.  Use of a  water pill (diuretic). These medications are appropriate only for some types of edema. They pull fluid out of your body and make you urinate more often. This gets rid of fluid and reduces swelling, but diuretics can have side effects. Only use diuretics as directed by your health care provider. HOME CARE INSTRUCTIONS   Keep the affected body part above the level of your heart when you are lying down.   Do not sit still or stand for prolonged periods.   Do not put anything directly under your knees when lying down.  Do not wear constricting clothing or garters on your upper legs.   Exercise your legs to work the fluid back into your blood vessels. This may help the swelling go down.   Wear elastic bandages or support stockings to reduce ankle swelling as directed by your health care provider.   Eat a low-salt diet to reduce fluid if your health care provider recommends it.   Only take medicines as directed by your health care provider. SEEK MEDICAL CARE IF:   Your edema is not responding to treatment.  You have heart, liver, or kidney disease and notice symptoms of edema.  You have edema in your legs that does not improve after elevating them.   You have sudden and unexplained weight gain. SEEK IMMEDIATE MEDICAL CARE IF:   You develop shortness of breath or chest pain.   You cannot breathe when you lie down.  You develop pain, redness, or warmth in the swollen  areas.   You have heart, liver, or kidney disease and suddenly get edema.  You have a fever and your symptoms suddenly get worse. MAKE SURE YOU:   Understand these instructions.  Will watch your condition.  Will get help right away if you are not doing well or get worse. Document Released: 10/14/2005 Document Revised: 02/28/2014 Document Reviewed: 08/06/2013 [facility] Patient Information 2015 [place], [place]. This information is not intended to replace advice given to you by your health care provider. Make  sure you discuss any questions you have with your health care provider.

## 2014-09-04 NOTE — ED Notes (Addendum)
Pt reports moderate swelling to right ankle and lower leg x 24 hrs. Hx of chf, has milrinone drip left picc line, denies sob.

## 2014-09-07 ENCOUNTER — Encounter (HOSPITAL_COMMUNITY): Payer: Self-pay

## 2014-09-07 ENCOUNTER — Ambulatory Visit (HOSPITAL_COMMUNITY)
Admission: RE | Admit: 2014-09-07 | Discharge: 2014-09-07 | Disposition: A | Discharge status: Home / Self Care | Payer: Medicare Other | Source: Ambulatory Visit | Attending: Internal Medicine | Admitting: Internal Medicine

## 2014-09-07 ENCOUNTER — Encounter: Payer: Self-pay | Admitting: Internal Medicine

## 2014-09-07 VITALS — BP 116/64 | HR 75 | Wt 85.8 lb

## 2014-09-07 DIAGNOSIS — I5022 Chronic systolic (congestive) heart failure: Secondary | ICD-10-CM | POA: Insufficient documentation

## 2014-09-07 DIAGNOSIS — I4891 Unspecified atrial fibrillation: Secondary | ICD-10-CM

## 2014-09-07 DIAGNOSIS — I480 Paroxysmal atrial fibrillation: Secondary | ICD-10-CM | POA: Diagnosis not present

## 2014-09-07 DIAGNOSIS — I2510 Atherosclerotic heart disease of native coronary artery without angina pectoris: Secondary | ICD-10-CM | POA: Diagnosis not present

## 2014-09-07 NOTE — Addendum Note (Signed)
Encounter addended by: Kerry Dory, CMA on: 09/07/2014  3:08 PM<BR>     Documentation filed: Patient Instructions Section

## 2014-09-07 NOTE — Patient Instructions (Signed)
May add one extra dose torsemide and potassium for weight greater than 86 pounds  Your physician recommends that you schedule a follow-up appointment in: 2 months  Do the following things EVERYDAY: 1) Weigh yourself in the morning before breakfast. Write it down and keep it in a log. 2) Take your medicines as prescribed 3) Eat low salt foods-Limit salt (sodium) to 2000 mg per day.  4) Stay as active as you can everyday 5) Limit all fluids for the day to less than 2 liters 6)

## 2014-09-07 NOTE — Progress Notes (Signed)
Patient ID: Jacqueline Reilly, female   DOB: April 08, 1924, 80 y.o.   MRN: [id number] PCP: [name]  HPI: [name] Jacqueline Reilly) is an 80 y/o woman with CAD, AAA, systolic HF, dementia, afib and CAD. She has had two previous coronary interventions including a cutting balloon to her D2 in 2007 and a DES to her mid LCX in 2010. Her LV dysfunction has been out of proportion to her CAD.  Last echo in 7/15 showed EF 20-25% with severe LV dilation, mild MR, and PA systolic pressure 38 mmHg.   Admitted 7/11-7/24/15 with syncope and dyspnea. She had new onset A-fib/RVR. Troponin was 1.03> 1.59> 2.18 . She was placed on amiodarone and heparin drip. She developed respiratory distress and required intubation. She converted to NSR but later was bradycardiac with NSVT. Amiodarone temporarily stopped but restarted after hyperkalemia resolved. Hyperkalemia was thought to be contributing to bradycardia. Diuresed with IV lasix. Co-ox 29% and started on milrinone which we tried to wean off but she did not tolerate. Discharge weight 87 lbs.   She returns for follow up. She is doing quite well on home milrinone. Her son is following her meds and weights closely. 3 days ago she was seen in ER with increased LE edema. Gave one dose IV lasix and gave her extra dose of po lasix as well. [facility] is following for milrinone and labs. Jacqueline Reilly from [facility] has also offered her services.  Breathing is ok. Swelling has gone down. Weight at home 84- 85 pounds. Says she is taking all medications. [name] are helping with ADLs and meals. BP running anywhere 97-133.   Labs: (05/23/14) K+ 4.3, creatinine 1.06, digoxin 1.1 (8/15) K 4.7, creatinine 0.94, HCT 33.2 (9/15) K 3.4, creatinine .80 (10/1/0/2015) K 5.0 Creatinine 0.87 Magnesium 2.4  08/11/14 K 5.3 Creatinine 0.8 08/25/14 K 4.4 Cr 0.85 09/04/14   K 4.0 Cr 0.8 pBNP 1763  ROS: All systems negative except as listed in HPI, PMH and Problem List.  SH:  History   Social History  .  Marital Status: Married    Spouse Name: N/A    Number of Children: 1  . Years of Education: N/A   Occupational History  . retired    Social History Main Topics  . Smoking status: Never Smoker   . Smokeless tobacco: Never Used  . Alcohol Use: Yes     Comment: occasionally  . Drug Use: No  . Sexual Activity: Not on file   Other Topics Concern  . Not on file   Social History Narrative   She lives in [place], family helps with her care.    FH:  Family History  Problem Relation Age of Onset  . Heart disease Mother   . Heart disease Father   . Heart disease Brother     x 3    Past Medical History  Diagnosis Date  . PVD (peripheral vascular disease)   . Ventricular dysfunction     left; ischemic  . Hyperlipidemia   . AAA (abdominal aortic aneurysm)   . Renal artery stenosis   . Hypertension   . Coronary artery disease 2007    moderate ASCAD of the left system s/p PCI of the D2 and PCI of the left circ 03/2009  . Chronic systolic heart failure     a. ECHO (04/2014) EF 20-25%, diff HK, mild MR  . Atrial fibrillation     Current Outpatient Prescriptions  Medication Sig Dispense Refill  . acetaminophen (TYLENOL) 500  MG tablet Take 1 tablet (500 mg total) by mouth every 6 (six) hours as needed (for pain). 30 tablet 1  . amiodarone (PACERONE) 200 MG tablet Take 1 tablet (200 mg total) by mouth daily. 90 tablet 3  . apixaban (ELIQUIS) 2.5 MG TABS tablet Take 1 tablet (2.5 mg total) by mouth 2 (two) times daily. 60 tablet 6  . milrinone (PRIMACOR) 20 MG/100ML SOLN infusion Inject 4.95 mcg/min into the vein continuous. Per [facility] 100 mL 3  . Multiple Vitamin (MULTIVITAMIN) tablet Take 1 tablet by mouth daily. 30 tablet 1  . nitroGLYCERIN (NITROSTAT) 0.4 MG SL tablet Place 1 tablet (0.4 mg total) under the tongue every 5 (five) minutes as needed for chest pain. 15 tablet 1  . Omega-3 Fatty Acids (FISH OIL) 1200 MG CAPS Take 1 capsule (1,200 mg total) by mouth  daily. 30 capsule 1  . potassium chloride SA (KLOR-CON M20) 20 MEQ tablet Take 1 tablet (20 mEq total) by mouth daily. 90 tablet 3  . sertraline (ZOLOFT) 25 MG tablet Take 1 tablet (25 mg total) by mouth at bedtime. 30 tablet 1  . torsemide (DEMADEX) 20 MG tablet Take 2 tablets (40 mg total) by mouth 2 (two) times daily. 120 tablet 3  . feeding supplement, ENSURE COMPLETE, (ENSURE COMPLETE) LIQD Take 237 mLs by mouth 2 (two) times daily between meals.     No current facility-administered medications for this encounter.    Filed Vitals:   09/07/14 1441  BP: 116/64  Pulse: 75  Weight: 85 lb 12.8 oz (38.919 kg)  SpO2: 96%    PHYSICAL EXAM:  General:  Elderly appearing. Frail, No resp difficulty; husband and brother present. Ambulated in the clinic with a walker.  HEENT: normal Neck: supple. JVP 8-9 Carotids 2+ bilaterally; no bruits. No lymphadenopathy or thryomegaly appreciated. Cor: PMI normal. Regular rate & rhythm. No rubs, gallops.  2/6 SEM apex Lungs: clear Abdomen: soft, nontender, nondistended. No hepatosplenomegaly. No bruits or masses. Good bowel sounds. Extremities: no cyanosis, clubbing, rash, edema LUE PICC Neuro: alert & orientedx3, cranial nerves grossly intact. Moves all 4 extremities w/o difficulty. Affect pleasant.   ASSESSMENT & PLAN:   1) Chronic Systolic Heart Failure: Mixed ischemic/nonischemic cardiomyopathy (degree of LV dysfunction is out of proportion to CAD), EF 25-30% (04/2014).  - Doing well NYHA II-III symptoms and volume status stable on milrinone. - Medication issues seemed to have resolved with her brother's assistance.  - We considered adding back low-dose lisinopril but given risk of hypotension will not do that at this time.  - Not on beta blocker/Ace at this time with low  output.  - Goal weight likely 82-84 pounds if she hits 86 pound will take extra dose of torsemide and potassium - Reinforced the need and importance of daily weights, a low  sodium diet, and fluid restriction (less than 2 L a day). Instructed to call the HF clinic if weight increases more than 3 lbs overnight or 5 lbs in a week.  - Continue [facility] support. I think Jacqueline Reilly from [facility] can help as well.  2) Atrial fibrillation: Paroxysmal.  Appears to be in NSR.  Continue apixaban 2.5 mg bid (weight less than 60 kg and age >67). Continue amiodarone to 200 mg daily.  Needs yearly eye exams.  3) Deconditioning: Continue [facility] for HHPT. 4) Limited Code: No CPR or defibrillation 5) CAD: No chest pain. OffASA with Eliquis.    Follow up in 4 30months  Glori Bickers MD  09/07/2014     

## 2014-09-09 DIAGNOSIS — I509 Heart failure, unspecified: Secondary | ICD-10-CM | POA: Diagnosis not present

## 2014-09-09 DIAGNOSIS — I714 Abdominal aortic aneurysm, without rupture: Secondary | ICD-10-CM | POA: Diagnosis not present

## 2014-09-09 DIAGNOSIS — Z452 Encounter for adjustment and management of vascular access device: Secondary | ICD-10-CM | POA: Diagnosis not present

## 2014-09-09 DIAGNOSIS — I2510 Atherosclerotic heart disease of native coronary artery without angina pectoris: Secondary | ICD-10-CM | POA: Diagnosis not present

## 2014-09-09 DIAGNOSIS — I5043 Acute on chronic combined systolic (congestive) and diastolic (congestive) heart failure: Secondary | ICD-10-CM | POA: Diagnosis not present

## 2014-09-09 DIAGNOSIS — F0390 Unspecified dementia without behavioral disturbance: Secondary | ICD-10-CM | POA: Diagnosis not present

## 2014-09-09 DIAGNOSIS — I252 Old myocardial infarction: Secondary | ICD-10-CM | POA: Diagnosis not present

## 2014-09-12 ENCOUNTER — Other Ambulatory Visit: Payer: Self-pay | Admitting: *Deleted

## 2014-09-12 DIAGNOSIS — I5022 Chronic systolic (congestive) heart failure: Secondary | ICD-10-CM

## 2014-09-12 MED ORDER — TORSEMIDE 20 MG PO TABS: 40.0000 mg | ORAL_TABLET | Freq: Two times a day (BID) | ORAL | Status: DC

## 2014-09-13 ENCOUNTER — Encounter: Payer: Self-pay | Admitting: Internal Medicine

## 2014-09-16 DIAGNOSIS — I509 Heart failure, unspecified: Secondary | ICD-10-CM | POA: Diagnosis not present

## 2014-09-16 DIAGNOSIS — I714 Abdominal aortic aneurysm, without rupture: Secondary | ICD-10-CM | POA: Diagnosis not present

## 2014-09-16 DIAGNOSIS — I5043 Acute on chronic combined systolic (congestive) and diastolic (congestive) heart failure: Secondary | ICD-10-CM | POA: Diagnosis not present

## 2014-09-16 DIAGNOSIS — Z452 Encounter for adjustment and management of vascular access device: Secondary | ICD-10-CM | POA: Diagnosis not present

## 2014-09-16 DIAGNOSIS — I2510 Atherosclerotic heart disease of native coronary artery without angina pectoris: Secondary | ICD-10-CM | POA: Diagnosis not present

## 2014-09-16 DIAGNOSIS — F0390 Unspecified dementia without behavioral disturbance: Secondary | ICD-10-CM | POA: Diagnosis not present

## 2014-09-16 DIAGNOSIS — I252 Old myocardial infarction: Secondary | ICD-10-CM | POA: Diagnosis not present

## 2014-09-21 ENCOUNTER — Telehealth (HOSPITAL_COMMUNITY): Payer: Self-pay | Admitting: Vascular Surgery

## 2014-09-21 DIAGNOSIS — I2510 Atherosclerotic heart disease of native coronary artery without angina pectoris: Secondary | ICD-10-CM | POA: Diagnosis not present

## 2014-09-21 DIAGNOSIS — F0390 Unspecified dementia without behavioral disturbance: Secondary | ICD-10-CM | POA: Diagnosis not present

## 2014-09-21 DIAGNOSIS — I714 Abdominal aortic aneurysm, without rupture: Secondary | ICD-10-CM | POA: Diagnosis not present

## 2014-09-21 DIAGNOSIS — I5043 Acute on chronic combined systolic (congestive) and diastolic (congestive) heart failure: Secondary | ICD-10-CM | POA: Diagnosis not present

## 2014-09-21 DIAGNOSIS — M25473 Effusion, unspecified ankle: Secondary | ICD-10-CM | POA: Diagnosis not present

## 2014-09-21 DIAGNOSIS — I252 Old myocardial infarction: Secondary | ICD-10-CM | POA: Diagnosis not present

## 2014-09-21 DIAGNOSIS — Z452 Encounter for adjustment and management of vascular access device: Secondary | ICD-10-CM | POA: Diagnosis not present

## 2014-09-21 NOTE — Telephone Encounter (Signed)
Spoke w/pt's son, he states her right ankle is swollen and red and warm to touch, wt has been stable running 85-87 lbs, denies SOB, he states he came into town yesterday and noticed it, pt is insistent she does not want to have it looked at.  Advised pt should go to pcp or urgent care for eval could ? Be gout, pt's son is agreeable and will call pcp

## 2014-09-21 NOTE — Telephone Encounter (Signed)
Pt son called nurse.Marland Kitchen Pt rt ankle has increased edema... Weight 85 lbs no sob has been taking her torsemide.. Please advise

## 2014-09-23 ENCOUNTER — Telehealth (HOSPITAL_COMMUNITY): Payer: Self-pay | Admitting: Anesthesiology

## 2014-09-23 DIAGNOSIS — I2510 Atherosclerotic heart disease of native coronary artery without angina pectoris: Secondary | ICD-10-CM | POA: Diagnosis not present

## 2014-09-23 DIAGNOSIS — I714 Abdominal aortic aneurysm, without rupture: Secondary | ICD-10-CM | POA: Diagnosis not present

## 2014-09-23 DIAGNOSIS — I5043 Acute on chronic combined systolic (congestive) and diastolic (congestive) heart failure: Secondary | ICD-10-CM | POA: Diagnosis not present

## 2014-09-23 DIAGNOSIS — Z452 Encounter for adjustment and management of vascular access device: Secondary | ICD-10-CM | POA: Diagnosis not present

## 2014-09-23 DIAGNOSIS — I509 Heart failure, unspecified: Secondary | ICD-10-CM | POA: Diagnosis not present

## 2014-09-23 DIAGNOSIS — I252 Old myocardial infarction: Secondary | ICD-10-CM | POA: Diagnosis not present

## 2014-09-23 DIAGNOSIS — F0390 Unspecified dementia without behavioral disturbance: Secondary | ICD-10-CM | POA: Diagnosis not present

## 2014-09-23 NOTE — Telephone Encounter (Signed)
[place] from [facility] called with 3 lb weight gain in 1 day. Nurse reports no ankle edema and small abdominal girth. Patient does not know if she has metolazone. Will have her take extra 20 mg torsemide today and tomorrow with extra 20 meq of potassium both days. Told to call back if weight does not decrease or has any symptoms.  Junie Bame B NP-C 3:09 PM

## 2014-09-24 ENCOUNTER — Telehealth: Payer: Self-pay | Admitting: Nurse Practitioner

## 2014-09-24 NOTE — Telephone Encounter (Signed)
Pts son called this morning to report that despite extra torsemide yesterday, pts wt is up 2 more lbs (5 total over past 2-3 days).  Pt is apparently asymptomatic.  He did give her an extra torsemide this AM (usually takes 40 BID - took 60mg  this AM).  I recommended that he watch her urine output/response to torsemide this AM.  If she has a good response, then he should weigh her in the AM and call back if wt remains up.  If she does not respond to additional torsemide this morning, he may give an additional this afternoon as well (with additional potassium).  If at any point pt becomes symptomatic, he should bring her into the ED for evaluation.  Son verbalized understanding and will call back if needed.

## 2014-09-27 ENCOUNTER — Telehealth (HOSPITAL_COMMUNITY): Payer: Self-pay | Admitting: Vascular Surgery

## 2014-09-27 NOTE — Telephone Encounter (Signed)
Pt son called... He would like a call back.. His parents are attempting to fire home health care, the parents only allow a hour a day if that.. The son believes his father can not take care of mother and they need the help... Please advise

## 2014-09-28 NOTE — Telephone Encounter (Signed)
Spoke w/pt's son, he states pt is ready to fire the company that assist them with adl's, they are fine with [facility], he is concerned that they really need this help or they should not be alone, he feels that they have been reported to the state when they fired the last company adn is unsure of what next steps should be, will have Kennyth Lose our social worker call him tomorrow to follow up

## 2014-09-29 ENCOUNTER — Encounter (HOSPITAL_COMMUNITY): Payer: Medicare Other

## 2014-09-29 ENCOUNTER — Telehealth: Payer: Self-pay | Admitting: Licensed Clinical Social Worker

## 2014-09-29 NOTE — Telephone Encounter (Signed)
CSW contacted patient's brother [name] to discuss home environment due to inability to reach son. CSW met with patient and brother a few weeks back regarding home environment. At the time, patient had 5 hours a day/7 days a week of homecare aide  services. Patient's brother reports that their son was visiting from [place] last week and during that time patient's husband has threaten to cancel homecare services. Patient's brother feels as though husband's dementia has escalated and creating challenges for the homecare services in the home to care for patient. Brother reports that husband was evaluated at [facility] last week for a "psych eval" because "he doesn't get along with his son". Brother reports that patient's son has returned to [place] although husband is still threatening to cancel the needed homecare services due to cost. Brother states he has made arrangements for the bill for homecare services rendered to be redirected to avoid the husband's involvement. He is hopeful that will reduce the husband's stress about the services and assist with smooth uninterrupted services for patient. Brother concerned about continued concerns about husband's dementia getting in the way of services for patient. Brother plans to see if the change in redirecting the bill will help the situation but asked what could be the next step. CSW provided the number for Adult Protective Services for county involvement and assessment of home environment. Brother will return call to CSW if further information or assistance needed. CSW continues to be available as needed. Raquel Sarna, [place]

## 2014-09-29 NOTE — Telephone Encounter (Signed)
CSW received referral to contact son who had expressed concern about his parents and requested return call. CSW called and left message for return call. Raquel Sarna, LCSW [phone]

## 2014-09-30 DIAGNOSIS — I2510 Atherosclerotic heart disease of native coronary artery without angina pectoris: Secondary | ICD-10-CM | POA: Diagnosis not present

## 2014-09-30 DIAGNOSIS — Z452 Encounter for adjustment and management of vascular access device: Secondary | ICD-10-CM | POA: Diagnosis not present

## 2014-09-30 DIAGNOSIS — I252 Old myocardial infarction: Secondary | ICD-10-CM | POA: Diagnosis not present

## 2014-09-30 DIAGNOSIS — I5043 Acute on chronic combined systolic (congestive) and diastolic (congestive) heart failure: Secondary | ICD-10-CM | POA: Diagnosis not present

## 2014-09-30 DIAGNOSIS — F0390 Unspecified dementia without behavioral disturbance: Secondary | ICD-10-CM | POA: Diagnosis not present

## 2014-09-30 DIAGNOSIS — I509 Heart failure, unspecified: Secondary | ICD-10-CM | POA: Diagnosis not present

## 2014-09-30 DIAGNOSIS — I714 Abdominal aortic aneurysm, without rupture: Secondary | ICD-10-CM | POA: Diagnosis not present

## 2014-10-04 ENCOUNTER — Telehealth (HOSPITAL_COMMUNITY): Payer: Self-pay | Admitting: Vascular Surgery

## 2014-10-04 NOTE — Telephone Encounter (Signed)
Left VM for Jacqueline Reilly ok for social worker consult, call back for questions

## 2014-10-04 NOTE — Telephone Encounter (Signed)
Systems analyst for [place]  .Marland Kitchen She got a call from pt Brother requesting another Social work visit she needs an order from Dr. Haroldine Laws.. Please advise

## 2014-10-06 ENCOUNTER — Encounter: Payer: Self-pay | Admitting: Internal Medicine

## 2014-10-07 DIAGNOSIS — Z452 Encounter for adjustment and management of vascular access device: Secondary | ICD-10-CM | POA: Diagnosis not present

## 2014-10-07 DIAGNOSIS — I5043 Acute on chronic combined systolic (congestive) and diastolic (congestive) heart failure: Secondary | ICD-10-CM | POA: Diagnosis not present

## 2014-10-07 DIAGNOSIS — F0390 Unspecified dementia without behavioral disturbance: Secondary | ICD-10-CM | POA: Diagnosis not present

## 2014-10-07 DIAGNOSIS — I714 Abdominal aortic aneurysm, without rupture: Secondary | ICD-10-CM | POA: Diagnosis not present

## 2014-10-07 DIAGNOSIS — I509 Heart failure, unspecified: Secondary | ICD-10-CM | POA: Diagnosis not present

## 2014-10-07 DIAGNOSIS — I252 Old myocardial infarction: Secondary | ICD-10-CM | POA: Diagnosis not present

## 2014-10-07 DIAGNOSIS — I2510 Atherosclerotic heart disease of native coronary artery without angina pectoris: Secondary | ICD-10-CM | POA: Diagnosis not present

## 2014-10-10 DIAGNOSIS — I2510 Atherosclerotic heart disease of native coronary artery without angina pectoris: Secondary | ICD-10-CM | POA: Diagnosis not present

## 2014-10-10 DIAGNOSIS — I714 Abdominal aortic aneurysm, without rupture: Secondary | ICD-10-CM | POA: Diagnosis not present

## 2014-10-10 DIAGNOSIS — F0390 Unspecified dementia without behavioral disturbance: Secondary | ICD-10-CM | POA: Diagnosis not present

## 2014-10-10 DIAGNOSIS — I252 Old myocardial infarction: Secondary | ICD-10-CM | POA: Diagnosis not present

## 2014-10-10 DIAGNOSIS — Z452 Encounter for adjustment and management of vascular access device: Secondary | ICD-10-CM | POA: Diagnosis not present

## 2014-10-10 DIAGNOSIS — I5043 Acute on chronic combined systolic (congestive) and diastolic (congestive) heart failure: Secondary | ICD-10-CM | POA: Diagnosis not present

## 2014-10-12 ENCOUNTER — Encounter: Payer: Self-pay | Admitting: Internal Medicine

## 2014-10-14 DIAGNOSIS — F0390 Unspecified dementia without behavioral disturbance: Secondary | ICD-10-CM | POA: Diagnosis not present

## 2014-10-14 DIAGNOSIS — I714 Abdominal aortic aneurysm, without rupture: Secondary | ICD-10-CM | POA: Diagnosis not present

## 2014-10-14 DIAGNOSIS — Z452 Encounter for adjustment and management of vascular access device: Secondary | ICD-10-CM | POA: Diagnosis not present

## 2014-10-14 DIAGNOSIS — I2510 Atherosclerotic heart disease of native coronary artery without angina pectoris: Secondary | ICD-10-CM | POA: Diagnosis not present

## 2014-10-14 DIAGNOSIS — I5043 Acute on chronic combined systolic (congestive) and diastolic (congestive) heart failure: Secondary | ICD-10-CM | POA: Diagnosis not present

## 2014-10-14 DIAGNOSIS — I509 Heart failure, unspecified: Secondary | ICD-10-CM | POA: Diagnosis not present

## 2014-10-14 DIAGNOSIS — I252 Old myocardial infarction: Secondary | ICD-10-CM | POA: Diagnosis not present

## 2014-10-19 DIAGNOSIS — F0390 Unspecified dementia without behavioral disturbance: Secondary | ICD-10-CM | POA: Diagnosis not present

## 2014-10-19 DIAGNOSIS — I2510 Atherosclerotic heart disease of native coronary artery without angina pectoris: Secondary | ICD-10-CM | POA: Diagnosis not present

## 2014-10-19 DIAGNOSIS — I252 Old myocardial infarction: Secondary | ICD-10-CM | POA: Diagnosis not present

## 2014-10-19 DIAGNOSIS — I5043 Acute on chronic combined systolic (congestive) and diastolic (congestive) heart failure: Secondary | ICD-10-CM | POA: Diagnosis not present

## 2014-10-19 DIAGNOSIS — I714 Abdominal aortic aneurysm, without rupture: Secondary | ICD-10-CM | POA: Diagnosis not present

## 2014-10-19 DIAGNOSIS — Z452 Encounter for adjustment and management of vascular access device: Secondary | ICD-10-CM | POA: Diagnosis not present

## 2014-10-24 ENCOUNTER — Encounter: Payer: Self-pay | Admitting: Internal Medicine

## 2014-10-26 DIAGNOSIS — I714 Abdominal aortic aneurysm, without rupture: Secondary | ICD-10-CM | POA: Diagnosis not present

## 2014-10-26 DIAGNOSIS — I2510 Atherosclerotic heart disease of native coronary artery without angina pectoris: Secondary | ICD-10-CM | POA: Diagnosis not present

## 2014-10-26 DIAGNOSIS — I5043 Acute on chronic combined systolic (congestive) and diastolic (congestive) heart failure: Secondary | ICD-10-CM | POA: Diagnosis not present

## 2014-10-26 DIAGNOSIS — I252 Old myocardial infarction: Secondary | ICD-10-CM | POA: Diagnosis not present

## 2014-10-26 DIAGNOSIS — I509 Heart failure, unspecified: Secondary | ICD-10-CM | POA: Diagnosis not present

## 2014-10-26 DIAGNOSIS — Z452 Encounter for adjustment and management of vascular access device: Secondary | ICD-10-CM | POA: Diagnosis not present

## 2014-10-26 DIAGNOSIS — F0390 Unspecified dementia without behavioral disturbance: Secondary | ICD-10-CM | POA: Diagnosis not present

## 2014-10-29 DIAGNOSIS — I2510 Atherosclerotic heart disease of native coronary artery without angina pectoris: Secondary | ICD-10-CM | POA: Diagnosis not present

## 2014-10-29 DIAGNOSIS — I4891 Unspecified atrial fibrillation: Secondary | ICD-10-CM | POA: Diagnosis not present

## 2014-10-29 DIAGNOSIS — I5043 Acute on chronic combined systolic (congestive) and diastolic (congestive) heart failure: Secondary | ICD-10-CM | POA: Diagnosis not present

## 2014-10-29 DIAGNOSIS — I714 Abdominal aortic aneurysm, without rupture: Secondary | ICD-10-CM | POA: Diagnosis not present

## 2014-10-29 DIAGNOSIS — I252 Old myocardial infarction: Secondary | ICD-10-CM | POA: Diagnosis not present

## 2014-10-29 DIAGNOSIS — Z452 Encounter for adjustment and management of vascular access device: Secondary | ICD-10-CM | POA: Diagnosis not present

## 2014-10-29 DIAGNOSIS — I10 Essential (primary) hypertension: Secondary | ICD-10-CM | POA: Diagnosis not present

## 2014-10-29 DIAGNOSIS — F0390 Unspecified dementia without behavioral disturbance: Secondary | ICD-10-CM | POA: Diagnosis not present

## 2014-11-02 DIAGNOSIS — I252 Old myocardial infarction: Secondary | ICD-10-CM | POA: Diagnosis not present

## 2014-11-02 DIAGNOSIS — I2510 Atherosclerotic heart disease of native coronary artery without angina pectoris: Secondary | ICD-10-CM | POA: Diagnosis not present

## 2014-11-02 DIAGNOSIS — I509 Heart failure, unspecified: Secondary | ICD-10-CM | POA: Diagnosis not present

## 2014-11-02 DIAGNOSIS — Z452 Encounter for adjustment and management of vascular access device: Secondary | ICD-10-CM | POA: Diagnosis not present

## 2014-11-02 DIAGNOSIS — I714 Abdominal aortic aneurysm, without rupture: Secondary | ICD-10-CM | POA: Diagnosis not present

## 2014-11-02 DIAGNOSIS — F0390 Unspecified dementia without behavioral disturbance: Secondary | ICD-10-CM | POA: Diagnosis not present

## 2014-11-02 DIAGNOSIS — I5043 Acute on chronic combined systolic (congestive) and diastolic (congestive) heart failure: Secondary | ICD-10-CM | POA: Diagnosis not present

## 2014-11-08 ENCOUNTER — Encounter: Payer: Self-pay | Admitting: Internal Medicine

## 2014-11-09 ENCOUNTER — Telehealth (HOSPITAL_COMMUNITY): Payer: Self-pay | Admitting: Vascular Surgery

## 2014-11-09 DIAGNOSIS — I5043 Acute on chronic combined systolic (congestive) and diastolic (congestive) heart failure: Secondary | ICD-10-CM | POA: Diagnosis not present

## 2014-11-09 DIAGNOSIS — I252 Old myocardial infarction: Secondary | ICD-10-CM | POA: Diagnosis not present

## 2014-11-09 DIAGNOSIS — I509 Heart failure, unspecified: Secondary | ICD-10-CM | POA: Diagnosis not present

## 2014-11-09 DIAGNOSIS — I2510 Atherosclerotic heart disease of native coronary artery without angina pectoris: Secondary | ICD-10-CM | POA: Diagnosis not present

## 2014-11-09 DIAGNOSIS — Z452 Encounter for adjustment and management of vascular access device: Secondary | ICD-10-CM | POA: Diagnosis not present

## 2014-11-09 DIAGNOSIS — I714 Abdominal aortic aneurysm, without rupture: Secondary | ICD-10-CM | POA: Diagnosis not present

## 2014-11-09 DIAGNOSIS — F0390 Unspecified dementia without behavioral disturbance: Secondary | ICD-10-CM | POA: Diagnosis not present

## 2014-11-09 NOTE — Telephone Encounter (Signed)
Spoke w/RN she states both ankles are slightly swollen, she states right ankle is more swollen than left and is red, she has contacted pcp regarding this but did want to report wt gain, she states pt is not SOB and states she has been taking her medications, she will advise pt to take an extra Torsemide today

## 2014-11-09 NOTE — Telephone Encounter (Signed)
Pt weight is up 2 lbs 86 lbs.. measurments  left ankle 18.2, right ankle last 18.8 cm 20 cm ab last 69 cm , today 70 cm pleas eadvise

## 2014-11-14 ENCOUNTER — Encounter: Payer: Self-pay | Admitting: Internal Medicine

## 2014-11-16 DIAGNOSIS — I2510 Atherosclerotic heart disease of native coronary artery without angina pectoris: Secondary | ICD-10-CM | POA: Diagnosis not present

## 2014-11-16 DIAGNOSIS — I714 Abdominal aortic aneurysm, without rupture: Secondary | ICD-10-CM | POA: Diagnosis not present

## 2014-11-16 DIAGNOSIS — F0390 Unspecified dementia without behavioral disturbance: Secondary | ICD-10-CM | POA: Diagnosis not present

## 2014-11-16 DIAGNOSIS — I509 Heart failure, unspecified: Secondary | ICD-10-CM | POA: Diagnosis not present

## 2014-11-16 DIAGNOSIS — I252 Old myocardial infarction: Secondary | ICD-10-CM | POA: Diagnosis not present

## 2014-11-16 DIAGNOSIS — I5043 Acute on chronic combined systolic (congestive) and diastolic (congestive) heart failure: Secondary | ICD-10-CM | POA: Diagnosis not present

## 2014-11-16 DIAGNOSIS — Z452 Encounter for adjustment and management of vascular access device: Secondary | ICD-10-CM | POA: Diagnosis not present

## 2014-11-17 NOTE — Telephone Encounter (Signed)
Open in error

## 2014-11-21 ENCOUNTER — Encounter: Payer: Self-pay | Admitting: Internal Medicine

## 2014-11-23 ENCOUNTER — Ambulatory Visit (HOSPITAL_COMMUNITY)
Admission: RE | Admit: 2014-11-23 | Discharge: 2014-11-23 | Disposition: A | Discharge status: Home / Self Care | Payer: Medicare Other | Source: Ambulatory Visit | Attending: Cardiology | Admitting: Cardiology

## 2014-11-23 ENCOUNTER — Encounter (HOSPITAL_COMMUNITY): Payer: Self-pay

## 2014-11-23 VITALS — BP 109/61 | HR 52 | Resp 18 | Wt 90.8 lb

## 2014-11-23 DIAGNOSIS — I5043 Acute on chronic combined systolic (congestive) and diastolic (congestive) heart failure: Secondary | ICD-10-CM | POA: Diagnosis not present

## 2014-11-23 DIAGNOSIS — R5381 Other malaise: Secondary | ICD-10-CM | POA: Insufficient documentation

## 2014-11-23 DIAGNOSIS — I480 Paroxysmal atrial fibrillation: Secondary | ICD-10-CM | POA: Diagnosis not present

## 2014-11-23 DIAGNOSIS — I5022 Chronic systolic (congestive) heart failure: Secondary | ICD-10-CM | POA: Diagnosis not present

## 2014-11-23 DIAGNOSIS — Z452 Encounter for adjustment and management of vascular access device: Secondary | ICD-10-CM | POA: Diagnosis not present

## 2014-11-23 DIAGNOSIS — I2510 Atherosclerotic heart disease of native coronary artery without angina pectoris: Secondary | ICD-10-CM | POA: Insufficient documentation

## 2014-11-23 DIAGNOSIS — I509 Heart failure, unspecified: Secondary | ICD-10-CM | POA: Diagnosis not present

## 2014-11-23 DIAGNOSIS — I714 Abdominal aortic aneurysm, without rupture: Secondary | ICD-10-CM | POA: Diagnosis not present

## 2014-11-23 DIAGNOSIS — F0390 Unspecified dementia without behavioral disturbance: Secondary | ICD-10-CM | POA: Diagnosis not present

## 2014-11-23 DIAGNOSIS — I2583 Coronary atherosclerosis due to lipid rich plaque: Secondary | ICD-10-CM

## 2014-11-23 DIAGNOSIS — I252 Old myocardial infarction: Secondary | ICD-10-CM | POA: Diagnosis not present

## 2014-11-23 MED ORDER — LISINOPRIL 2.5 MG PO TABS
2.5000 mg | ORAL_TABLET | Freq: Every day | ORAL | Status: DC
Start: 2014-11-23 — End: 2014-11-23

## 2014-11-23 MED ORDER — LISINOPRIL 2.5 MG PO TABS: 2.5000 mg | ORAL_TABLET | Freq: Every day | ORAL | Status: DC

## 2014-11-23 NOTE — Progress Notes (Signed)
Patient ID: [name], female   DOB: 1924-09-25, 81 y.o.   MRN: [id number] PCP: Jacqueline Reilly  HPI: [name] is an 81 y/o woman with CAD, AAA, systolic HF, mild dementia, afib and CAD. She has had two previous coronary interventions including a cutting balloon to her D2 in 2007 and a DES to her mid LCX in 2010. Her LV dysfunction has been out of proportion to her CAD.  Last echo in 7/15 showed EF 20-25% with severe LV dilation, mild MR, and PA systolic pressure 38 mmHg.   Admitted 7/11-7/24/15 with syncope and dyspnea. She had new onset A-fib/RVR. Troponin was 1.03> 1.59> 2.18 . She was placed on amiodarone and heparin drip. She developed respiratory distress and required intubation. She converted to NSR but later was bradycardiac with NSVT. Amiodarone temporarily stopped but restarted after hyperkalemia resolved. Hyperkalemia was thought to be contributing to bradycardia. Diuresed with IV lasix. Co-ox 29% and started on milrinone which we tried to wean off but she did not tolerate. Discharge weight 87 lbs.   She returns for follow up. She is doing quite well on home milrinone. Her son is following her meds and weights closely.  [facility] is following for milrinone and labs. Feels much better. Able to do all ADLs. Swelling has gone down. Weight at home 85-87 pounds.Comfort Jacqueline Reilly are helping with ADLs and meals. BP running anywhere 119-146   Labs: (05/23/14) K+ 4.3, creatinine 1.06, digoxin 1.1 (8/15) K 4.7, creatinine 0.94, HCT 33.2 (9/15) K 3.4, creatinine .80 (10/1/0/2015) K 5.0 Creatinine 0.87 Magnesium 2.4  08/11/14 K 5.3 Creatinine 0.8 08/25/14 K 4.4 Cr 0.85 09/04/14   K 4.0 Cr 0.8 pBNP 1763 11/02/14  K 4.2 Cr 0.85  ROS: All systems negative except as listed in HPI, PMH and Problem List.  SH:  History   Social History  . Marital Status: Married    Spouse Name: N/A    Number of Children: 1  . Years of Education: N/A   Occupational History  . retired    Social History Main  Topics  . Smoking status: Never Smoker   . Smokeless tobacco: Never Used  . Alcohol Use: Yes     Comment: occasionally  . Drug Use: No  . Sexual Activity: Not on file   Other Topics Concern  . Not on file   Social History Narrative   She lives in [place], family helps with her care.    FH:  Family History  Problem Relation Age of Onset  . Heart disease Mother   . Heart disease Father   . Heart disease Brother     x 3    Past Medical History  Diagnosis Date  . PVD (peripheral vascular disease)   . Ventricular dysfunction     left; ischemic  . Hyperlipidemia   . AAA (abdominal aortic aneurysm)   . Renal artery stenosis   . Hypertension   . Coronary artery disease 2007    moderate ASCAD of the left system s/p PCI of the D2 and PCI of the left circ 03/2009  . Chronic systolic heart failure     a. ECHO (04/2014) EF 20-25%, diff HK, mild MR  . Atrial fibrillation     Current Outpatient Prescriptions  Medication Sig Dispense Refill  . acetaminophen (TYLENOL) 500 MG tablet Take 1 tablet (500 mg total) by mouth every 6 (six) hours as needed (for pain). 30 tablet 1  . amiodarone (PACERONE) 200 MG tablet Take 1 tablet (200  mg total) by mouth daily. 90 tablet 3  . apixaban (ELIQUIS) 2.5 MG TABS tablet Take 1 tablet (2.5 mg total) by mouth 2 (two) times daily. 60 tablet 6  . feeding supplement, ENSURE COMPLETE, (ENSURE COMPLETE) LIQD Take 237 mLs by mouth 2 (two) times daily between meals.    . milrinone (PRIMACOR) 20 MG/100ML SOLN infusion Inject 4.95 mcg/min into the vein continuous. Per [facility] 100 mL 3  . Multiple Vitamin (MULTIVITAMIN) tablet Take 1 tablet by mouth daily. 30 tablet 1  . nitroGLYCERIN (NITROSTAT) 0.4 MG SL tablet Place 1 tablet (0.4 mg total) under the tongue every 5 (five) minutes as needed for chest pain. 15 tablet 1  . Omega-3 Fatty Acids (FISH OIL) 1200 MG CAPS Take 1 capsule (1,200 mg total) by mouth daily. 30 capsule 1  . potassium chloride  SA (KLOR-CON M20) 20 MEQ tablet Take 1 tablet (20 mEq total) by mouth daily. 90 tablet 3  . sertraline (ZOLOFT) 25 MG tablet Take 1 tablet (25 mg total) by mouth at bedtime. 30 tablet 1  . torsemide (DEMADEX) 20 MG tablet Take 2 tablets (40 mg total) by mouth 2 (two) times daily. 360 tablet 3   No current facility-administered medications for this encounter.    Filed Vitals:   11/23/14 1216  BP: 109/61  Pulse: 52  Resp: 18  Weight: 90 lb 12 oz (41.164 kg)  SpO2: 97%    PHYSICAL EXAM:  General:  Elderly appearing. Frail, No resp difficulty; husband and brother present. Ambulated in the clinic with a walker.  HEENT: normal Neck: supple. JVP 9 Carotids 2+ bilaterally; no bruits. No lymphadenopathy or thryomegaly appreciated. Cor: PMI normal. Regular rate & rhythm. No rubs, gallops.  2/6 SEM apex Lungs: clear Abdomen: soft, nontender, nondistended. No hepatosplenomegaly. No bruits or masses. Good bowel sounds. Extremities: no cyanosis, clubbing, rash, edema LUE PICC Neuro: alert & orientedx3, cranial nerves grossly intact. Moves all 4 extremities w/o difficulty. Affect pleasant.   ASSESSMENT & PLAN:   1) Chronic Systolic Heart Failure: Mixed ischemic/nonischemic cardiomyopathy (degree of LV dysfunction is out of proportion to CAD), EF 25-30% (04/2014).  - Doing well NYHA III symptoms and volume status stable on milrinone. - Medication issues seemed to have resolved with her brother's assistance.  - Will add back low-dose lisinopril 2.5 daily. Watch for hypotension. If SBP < 100 can stop.  - Not on beta blocker at this time with low  output.  - Goal weight likely 85-87 pounds if she hits 89 pounds will take extra dose of torsemide and potassium - Reinforced the need and importance of daily weights, a low sodium diet, and fluid restriction (less than 2 L a day). Instructed to call the HF clinic if weight increases more than 3 lbs overnight or 5 lbs in a week.  - Continue [facility] support. I  think Jacqueline Reilly from [facility] can help as well.  2) Atrial fibrillation: Paroxysmal.  Now in NSR.  Continue apixaban 2.5 mg bid (weight less than 60 kg and age >79). Continue amiodarone to 200 mg daily.  Needs yearly eye exams.  3) Deconditioning: Continue [facility] for HHPT. 4) Limited Code: No CPR or defibrillation 5) CAD: No chest pain. Off ASA with Eliquis.    Follow up in 2 months  Glori Bickers MD  11/23/2014

## 2014-11-23 NOTE — Addendum Note (Signed)
Encounter addended by: Kerry Dory, CMA on: 11/23/2014 12:43 PM<BR>     Documentation filed: [employer], Orders

## 2014-11-23 NOTE — Patient Instructions (Signed)
START Lisinopril 2.5mg  daily  Labs needed in one week (BMET)  Your physician recommends that you schedule a follow-up appointment in: 2 months   Do the following things EVERYDAY: 1) Weigh yourself in the morning before breakfast. Write it down and keep it in a log. 2) Take your medicines as prescribed 3) Eat low salt foods-Limit salt (sodium) to 2000 mg per day.  4) Stay as active as you can everyday 5) Limit all fluids for the day to less than 2 liters 6)

## 2014-11-23 NOTE — Addendum Note (Signed)
Encounter addended by: Kerry Dory, CMA on: 11/23/2014 12:40 PM<BR>     Documentation filed: [employer], Patient Instructions Section, Orders

## 2014-11-30 DIAGNOSIS — F0390 Unspecified dementia without behavioral disturbance: Secondary | ICD-10-CM | POA: Diagnosis not present

## 2014-11-30 DIAGNOSIS — I509 Heart failure, unspecified: Secondary | ICD-10-CM | POA: Diagnosis not present

## 2014-11-30 DIAGNOSIS — I5043 Acute on chronic combined systolic (congestive) and diastolic (congestive) heart failure: Secondary | ICD-10-CM | POA: Diagnosis not present

## 2014-11-30 DIAGNOSIS — I714 Abdominal aortic aneurysm, without rupture: Secondary | ICD-10-CM | POA: Diagnosis not present

## 2014-11-30 DIAGNOSIS — I252 Old myocardial infarction: Secondary | ICD-10-CM | POA: Diagnosis not present

## 2014-11-30 DIAGNOSIS — Z452 Encounter for adjustment and management of vascular access device: Secondary | ICD-10-CM | POA: Diagnosis not present

## 2014-11-30 DIAGNOSIS — I2510 Atherosclerotic heart disease of native coronary artery without angina pectoris: Secondary | ICD-10-CM | POA: Diagnosis not present

## 2014-12-02 DIAGNOSIS — M79671 Pain in right foot: Secondary | ICD-10-CM | POA: Diagnosis not present

## 2014-12-02 DIAGNOSIS — L603 Nail dystrophy: Secondary | ICD-10-CM | POA: Diagnosis not present

## 2014-12-02 DIAGNOSIS — M79674 Pain in right toe(s): Secondary | ICD-10-CM | POA: Diagnosis not present

## 2014-12-02 DIAGNOSIS — I739 Peripheral vascular disease, unspecified: Secondary | ICD-10-CM | POA: Diagnosis not present

## 2014-12-02 DIAGNOSIS — L84 Corns and callosities: Secondary | ICD-10-CM | POA: Diagnosis not present

## 2014-12-05 ENCOUNTER — Encounter: Payer: Self-pay | Admitting: Internal Medicine

## 2014-12-07 DIAGNOSIS — Z452 Encounter for adjustment and management of vascular access device: Secondary | ICD-10-CM | POA: Diagnosis not present

## 2014-12-07 DIAGNOSIS — I252 Old myocardial infarction: Secondary | ICD-10-CM | POA: Diagnosis not present

## 2014-12-07 DIAGNOSIS — F0390 Unspecified dementia without behavioral disturbance: Secondary | ICD-10-CM | POA: Diagnosis not present

## 2014-12-07 DIAGNOSIS — I2510 Atherosclerotic heart disease of native coronary artery without angina pectoris: Secondary | ICD-10-CM | POA: Diagnosis not present

## 2014-12-07 DIAGNOSIS — I714 Abdominal aortic aneurysm, without rupture: Secondary | ICD-10-CM | POA: Diagnosis not present

## 2014-12-07 DIAGNOSIS — I509 Heart failure, unspecified: Secondary | ICD-10-CM | POA: Diagnosis not present

## 2014-12-07 DIAGNOSIS — I5043 Acute on chronic combined systolic (congestive) and diastolic (congestive) heart failure: Secondary | ICD-10-CM | POA: Diagnosis not present

## 2014-12-13 DIAGNOSIS — I714 Abdominal aortic aneurysm, without rupture: Secondary | ICD-10-CM | POA: Diagnosis not present

## 2014-12-13 DIAGNOSIS — Z452 Encounter for adjustment and management of vascular access device: Secondary | ICD-10-CM | POA: Diagnosis not present

## 2014-12-13 DIAGNOSIS — I252 Old myocardial infarction: Secondary | ICD-10-CM | POA: Diagnosis not present

## 2014-12-13 DIAGNOSIS — I2510 Atherosclerotic heart disease of native coronary artery without angina pectoris: Secondary | ICD-10-CM | POA: Diagnosis not present

## 2014-12-13 DIAGNOSIS — I5043 Acute on chronic combined systolic (congestive) and diastolic (congestive) heart failure: Secondary | ICD-10-CM | POA: Diagnosis not present

## 2014-12-13 DIAGNOSIS — F0390 Unspecified dementia without behavioral disturbance: Secondary | ICD-10-CM | POA: Diagnosis not present

## 2014-12-14 ENCOUNTER — Telehealth (HOSPITAL_COMMUNITY): Payer: Self-pay | Admitting: Cardiology

## 2014-12-14 DIAGNOSIS — Z452 Encounter for adjustment and management of vascular access device: Secondary | ICD-10-CM | POA: Diagnosis not present

## 2014-12-14 DIAGNOSIS — F0390 Unspecified dementia without behavioral disturbance: Secondary | ICD-10-CM | POA: Diagnosis not present

## 2014-12-14 DIAGNOSIS — I2510 Atherosclerotic heart disease of native coronary artery without angina pectoris: Secondary | ICD-10-CM | POA: Diagnosis not present

## 2014-12-14 DIAGNOSIS — I509 Heart failure, unspecified: Secondary | ICD-10-CM | POA: Diagnosis not present

## 2014-12-14 DIAGNOSIS — I5043 Acute on chronic combined systolic (congestive) and diastolic (congestive) heart failure: Secondary | ICD-10-CM | POA: Diagnosis not present

## 2014-12-14 DIAGNOSIS — I714 Abdominal aortic aneurysm, without rupture: Secondary | ICD-10-CM | POA: Diagnosis not present

## 2014-12-14 DIAGNOSIS — I252 Old myocardial infarction: Secondary | ICD-10-CM | POA: Diagnosis not present

## 2014-12-14 NOTE — Telephone Encounter (Signed)
Called during pts [place] visit pts bag/drip/pump has been beeping with occlusion error on machine Nurse did heparinize line and was able to get it flushed, however line still has some resistance Medicare does not pay for cath-flow in the home  Advised would make note and have nurse call if line begins to clot again and we can set up cath flow through short stay, normally we can get this same day

## 2014-12-20 ENCOUNTER — Encounter: Payer: Self-pay | Admitting: Internal Medicine

## 2014-12-21 ENCOUNTER — Encounter: Payer: Self-pay | Admitting: Internal Medicine

## 2014-12-21 DIAGNOSIS — I509 Heart failure, unspecified: Secondary | ICD-10-CM | POA: Diagnosis not present

## 2014-12-21 DIAGNOSIS — F0390 Unspecified dementia without behavioral disturbance: Secondary | ICD-10-CM | POA: Diagnosis not present

## 2014-12-21 DIAGNOSIS — I5043 Acute on chronic combined systolic (congestive) and diastolic (congestive) heart failure: Secondary | ICD-10-CM | POA: Diagnosis not present

## 2014-12-21 DIAGNOSIS — I2510 Atherosclerotic heart disease of native coronary artery without angina pectoris: Secondary | ICD-10-CM | POA: Diagnosis not present

## 2014-12-21 DIAGNOSIS — I714 Abdominal aortic aneurysm, without rupture: Secondary | ICD-10-CM | POA: Diagnosis not present

## 2014-12-21 DIAGNOSIS — Z452 Encounter for adjustment and management of vascular access device: Secondary | ICD-10-CM | POA: Diagnosis not present

## 2014-12-21 DIAGNOSIS — I252 Old myocardial infarction: Secondary | ICD-10-CM | POA: Diagnosis not present

## 2014-12-27 DIAGNOSIS — Z452 Encounter for adjustment and management of vascular access device: Secondary | ICD-10-CM | POA: Diagnosis not present

## 2014-12-27 DIAGNOSIS — I2510 Atherosclerotic heart disease of native coronary artery without angina pectoris: Secondary | ICD-10-CM | POA: Diagnosis not present

## 2014-12-27 DIAGNOSIS — I714 Abdominal aortic aneurysm, without rupture: Secondary | ICD-10-CM | POA: Diagnosis not present

## 2014-12-27 DIAGNOSIS — F0390 Unspecified dementia without behavioral disturbance: Secondary | ICD-10-CM | POA: Diagnosis not present

## 2014-12-27 DIAGNOSIS — I5043 Acute on chronic combined systolic (congestive) and diastolic (congestive) heart failure: Secondary | ICD-10-CM | POA: Diagnosis not present

## 2014-12-27 DIAGNOSIS — I252 Old myocardial infarction: Secondary | ICD-10-CM | POA: Diagnosis not present

## 2014-12-28 DIAGNOSIS — I509 Heart failure, unspecified: Secondary | ICD-10-CM | POA: Diagnosis not present

## 2014-12-28 DIAGNOSIS — I5043 Acute on chronic combined systolic (congestive) and diastolic (congestive) heart failure: Secondary | ICD-10-CM | POA: Diagnosis not present

## 2014-12-28 DIAGNOSIS — I10 Essential (primary) hypertension: Secondary | ICD-10-CM | POA: Diagnosis not present

## 2014-12-28 DIAGNOSIS — I252 Old myocardial infarction: Secondary | ICD-10-CM | POA: Diagnosis not present

## 2014-12-28 DIAGNOSIS — Z452 Encounter for adjustment and management of vascular access device: Secondary | ICD-10-CM | POA: Diagnosis not present

## 2014-12-28 DIAGNOSIS — I2510 Atherosclerotic heart disease of native coronary artery without angina pectoris: Secondary | ICD-10-CM | POA: Diagnosis not present

## 2014-12-28 DIAGNOSIS — I714 Abdominal aortic aneurysm, without rupture: Secondary | ICD-10-CM | POA: Diagnosis not present

## 2014-12-28 DIAGNOSIS — F0390 Unspecified dementia without behavioral disturbance: Secondary | ICD-10-CM | POA: Diagnosis not present

## 2014-12-28 DIAGNOSIS — I4891 Unspecified atrial fibrillation: Secondary | ICD-10-CM | POA: Diagnosis not present

## 2015-01-02 ENCOUNTER — Telehealth (HOSPITAL_COMMUNITY): Payer: Self-pay | Admitting: Vascular Surgery

## 2015-01-02 NOTE — Telephone Encounter (Signed)
Spoke w/pt's brother, she states pt's O2 sats are running low per the home health nurse, he states she is a little SOB at times and he is very concerned.  appt sch for tomorrow afternoon

## 2015-01-02 NOTE — Telephone Encounter (Signed)
Pt husband left message he is concerned about pt low rate and SOB.Marland Kitchen Please advise

## 2015-01-02 NOTE — Telephone Encounter (Signed)
PT brother called he would like pt to s seen ASAP, PT 02 levels are low and she does not want to [place] the 02.. Please advise

## 2015-01-02 NOTE — Telephone Encounter (Signed)
Spoke w/pt's brother, see other phone note, appt was sch for pt for tomorrow due to low o2 levels and sob.  Called pt and her husband to discuss, both are on the line and argue about the need for appt, pt states husband is over reacting and she is not sob and does not need an appt she will wait till her appt on 3/29, advised pt and husband would leave appt sch for tomorrow and if pt wants to come she can if not she does not have to.  Called pt's brother back and let him know the situation he will f/u with pt and bring her to appt tomorrow if needed

## 2015-01-03 ENCOUNTER — Encounter (HOSPITAL_COMMUNITY): Payer: Medicare Other

## 2015-01-04 ENCOUNTER — Encounter (HOSPITAL_COMMUNITY): Payer: Medicare Other

## 2015-01-04 DIAGNOSIS — Z452 Encounter for adjustment and management of vascular access device: Secondary | ICD-10-CM | POA: Diagnosis not present

## 2015-01-04 DIAGNOSIS — I252 Old myocardial infarction: Secondary | ICD-10-CM | POA: Diagnosis not present

## 2015-01-04 DIAGNOSIS — I509 Heart failure, unspecified: Secondary | ICD-10-CM | POA: Diagnosis not present

## 2015-01-04 DIAGNOSIS — I2510 Atherosclerotic heart disease of native coronary artery without angina pectoris: Secondary | ICD-10-CM | POA: Diagnosis not present

## 2015-01-04 DIAGNOSIS — I714 Abdominal aortic aneurysm, without rupture: Secondary | ICD-10-CM | POA: Diagnosis not present

## 2015-01-04 DIAGNOSIS — I5043 Acute on chronic combined systolic (congestive) and diastolic (congestive) heart failure: Secondary | ICD-10-CM | POA: Diagnosis not present

## 2015-01-04 DIAGNOSIS — F0390 Unspecified dementia without behavioral disturbance: Secondary | ICD-10-CM | POA: Diagnosis not present

## 2015-01-11 DIAGNOSIS — I714 Abdominal aortic aneurysm, without rupture: Secondary | ICD-10-CM | POA: Diagnosis not present

## 2015-01-11 DIAGNOSIS — I252 Old myocardial infarction: Secondary | ICD-10-CM | POA: Diagnosis not present

## 2015-01-11 DIAGNOSIS — Z452 Encounter for adjustment and management of vascular access device: Secondary | ICD-10-CM | POA: Diagnosis not present

## 2015-01-11 DIAGNOSIS — I509 Heart failure, unspecified: Secondary | ICD-10-CM | POA: Diagnosis not present

## 2015-01-11 DIAGNOSIS — I2510 Atherosclerotic heart disease of native coronary artery without angina pectoris: Secondary | ICD-10-CM | POA: Diagnosis not present

## 2015-01-11 DIAGNOSIS — I5043 Acute on chronic combined systolic (congestive) and diastolic (congestive) heart failure: Secondary | ICD-10-CM | POA: Diagnosis not present

## 2015-01-11 DIAGNOSIS — F0390 Unspecified dementia without behavioral disturbance: Secondary | ICD-10-CM | POA: Diagnosis not present

## 2015-01-13 ENCOUNTER — Encounter: Payer: Self-pay | Admitting: Internal Medicine

## 2015-01-18 DIAGNOSIS — I5043 Acute on chronic combined systolic (congestive) and diastolic (congestive) heart failure: Secondary | ICD-10-CM | POA: Diagnosis not present

## 2015-01-18 DIAGNOSIS — Z452 Encounter for adjustment and management of vascular access device: Secondary | ICD-10-CM | POA: Diagnosis not present

## 2015-01-18 DIAGNOSIS — I252 Old myocardial infarction: Secondary | ICD-10-CM | POA: Diagnosis not present

## 2015-01-18 DIAGNOSIS — I509 Heart failure, unspecified: Secondary | ICD-10-CM | POA: Diagnosis not present

## 2015-01-18 DIAGNOSIS — I2510 Atherosclerotic heart disease of native coronary artery without angina pectoris: Secondary | ICD-10-CM | POA: Diagnosis not present

## 2015-01-18 DIAGNOSIS — F0390 Unspecified dementia without behavioral disturbance: Secondary | ICD-10-CM | POA: Diagnosis not present

## 2015-01-18 DIAGNOSIS — I714 Abdominal aortic aneurysm, without rupture: Secondary | ICD-10-CM | POA: Diagnosis not present

## 2015-01-24 ENCOUNTER — Encounter (HOSPITAL_COMMUNITY): Payer: Medicare Other

## 2015-01-25 DIAGNOSIS — I2510 Atherosclerotic heart disease of native coronary artery without angina pectoris: Secondary | ICD-10-CM | POA: Diagnosis not present

## 2015-01-25 DIAGNOSIS — Z452 Encounter for adjustment and management of vascular access device: Secondary | ICD-10-CM | POA: Diagnosis not present

## 2015-01-25 DIAGNOSIS — F0390 Unspecified dementia without behavioral disturbance: Secondary | ICD-10-CM | POA: Diagnosis not present

## 2015-01-25 DIAGNOSIS — I509 Heart failure, unspecified: Secondary | ICD-10-CM | POA: Diagnosis not present

## 2015-01-25 DIAGNOSIS — I5043 Acute on chronic combined systolic (congestive) and diastolic (congestive) heart failure: Secondary | ICD-10-CM | POA: Diagnosis not present

## 2015-01-25 DIAGNOSIS — I252 Old myocardial infarction: Secondary | ICD-10-CM | POA: Diagnosis not present

## 2015-01-25 DIAGNOSIS — I714 Abdominal aortic aneurysm, without rupture: Secondary | ICD-10-CM | POA: Diagnosis not present

## 2015-01-27 ENCOUNTER — Encounter: Payer: Self-pay | Admitting: Internal Medicine

## 2015-01-28 ENCOUNTER — Telehealth: Payer: Self-pay | Admitting: Physician Assistant

## 2015-01-28 NOTE — Telephone Encounter (Signed)
Paged by the patient's brother regarding medication Sertraline. Apparently patient has been more confused recently than usual, also hearing noises in the attic, also feels people has been plotting against her. She has called 911 several times for this.   I have reviewed her medication, unclear what's causing her AMS, her sertraline may play a factor, they can attempt to wean her off, although she really need to see an internal medicine doctor or family medicine doctor to further evaluate.   Hilbert Corrigan PA Pager: [phone]

## 2015-01-30 ENCOUNTER — Telehealth (HOSPITAL_COMMUNITY): Payer: Self-pay | Admitting: Vascular Surgery

## 2015-01-30 NOTE — Telephone Encounter (Signed)
Spoke w/pt's brother, he states pt is having some issues with hallucinations, she suspects people of trying to harm her.  He states he called over the weekend and spoke with PA on-call, they advised her to stop Zoloft as it could be contributing, he states she has stopped this mediation, he hasn't noticed a difference but she has only been off for 2 days.  He states her heart and fluid status all seem to be good.  Advised pt should follow up with PCP, Dr Laurann Montana.  He is agreeable and will call his office

## 2015-01-30 NOTE — Telephone Encounter (Signed)
Pt brother in law called he would like to speak to you about some personal and health matters that he believes that Dr. Haroldine Laws needs to know about his sister .Marland Kitchen Please  advise

## 2015-01-31 DIAGNOSIS — D509 Iron deficiency anemia, unspecified: Secondary | ICD-10-CM | POA: Diagnosis not present

## 2015-01-31 DIAGNOSIS — R443 Hallucinations, unspecified: Secondary | ICD-10-CM | POA: Diagnosis not present

## 2015-01-31 DIAGNOSIS — D649 Anemia, unspecified: Secondary | ICD-10-CM | POA: Diagnosis not present

## 2015-01-31 DIAGNOSIS — R413 Other amnesia: Secondary | ICD-10-CM | POA: Diagnosis not present

## 2015-02-01 DIAGNOSIS — I2510 Atherosclerotic heart disease of native coronary artery without angina pectoris: Secondary | ICD-10-CM | POA: Diagnosis not present

## 2015-02-01 DIAGNOSIS — Z452 Encounter for adjustment and management of vascular access device: Secondary | ICD-10-CM | POA: Diagnosis not present

## 2015-02-01 DIAGNOSIS — F0390 Unspecified dementia without behavioral disturbance: Secondary | ICD-10-CM | POA: Diagnosis not present

## 2015-02-01 DIAGNOSIS — I5043 Acute on chronic combined systolic (congestive) and diastolic (congestive) heart failure: Secondary | ICD-10-CM | POA: Diagnosis not present

## 2015-02-01 DIAGNOSIS — I252 Old myocardial infarction: Secondary | ICD-10-CM | POA: Diagnosis not present

## 2015-02-01 DIAGNOSIS — I714 Abdominal aortic aneurysm, without rupture: Secondary | ICD-10-CM | POA: Diagnosis not present

## 2015-02-01 DIAGNOSIS — I509 Heart failure, unspecified: Secondary | ICD-10-CM | POA: Diagnosis not present

## 2015-02-08 DIAGNOSIS — I5043 Acute on chronic combined systolic (congestive) and diastolic (congestive) heart failure: Secondary | ICD-10-CM | POA: Diagnosis not present

## 2015-02-08 DIAGNOSIS — Z452 Encounter for adjustment and management of vascular access device: Secondary | ICD-10-CM | POA: Diagnosis not present

## 2015-02-08 DIAGNOSIS — F0390 Unspecified dementia without behavioral disturbance: Secondary | ICD-10-CM | POA: Diagnosis not present

## 2015-02-08 DIAGNOSIS — I509 Heart failure, unspecified: Secondary | ICD-10-CM | POA: Diagnosis not present

## 2015-02-08 DIAGNOSIS — I2510 Atherosclerotic heart disease of native coronary artery without angina pectoris: Secondary | ICD-10-CM | POA: Diagnosis not present

## 2015-02-08 DIAGNOSIS — I714 Abdominal aortic aneurysm, without rupture: Secondary | ICD-10-CM | POA: Diagnosis not present

## 2015-02-08 DIAGNOSIS — I252 Old myocardial infarction: Secondary | ICD-10-CM | POA: Diagnosis not present

## 2015-02-09 DIAGNOSIS — I5043 Acute on chronic combined systolic (congestive) and diastolic (congestive) heart failure: Secondary | ICD-10-CM | POA: Diagnosis not present

## 2015-02-09 DIAGNOSIS — I2510 Atherosclerotic heart disease of native coronary artery without angina pectoris: Secondary | ICD-10-CM | POA: Diagnosis not present

## 2015-02-09 DIAGNOSIS — I714 Abdominal aortic aneurysm, without rupture: Secondary | ICD-10-CM | POA: Diagnosis not present

## 2015-02-09 DIAGNOSIS — F0390 Unspecified dementia without behavioral disturbance: Secondary | ICD-10-CM | POA: Diagnosis not present

## 2015-02-09 DIAGNOSIS — Z452 Encounter for adjustment and management of vascular access device: Secondary | ICD-10-CM | POA: Diagnosis not present

## 2015-02-09 DIAGNOSIS — I252 Old myocardial infarction: Secondary | ICD-10-CM | POA: Diagnosis not present

## 2015-02-10 ENCOUNTER — Encounter (HOSPITAL_COMMUNITY): Payer: Medicare Other

## 2015-02-10 ENCOUNTER — Ambulatory Visit (HOSPITAL_COMMUNITY)
Admission: RE | Admit: 2015-02-10 | Discharge: 2015-02-10 | Disposition: A | Discharge status: Home / Self Care | Payer: Medicare Other | Source: Ambulatory Visit | Attending: Cardiology | Admitting: Cardiology

## 2015-02-10 VITALS — BP 124/64 | HR 67 | Wt 91.8 lb

## 2015-02-10 DIAGNOSIS — I2510 Atherosclerotic heart disease of native coronary artery without angina pectoris: Secondary | ICD-10-CM | POA: Diagnosis not present

## 2015-02-10 DIAGNOSIS — I4891 Unspecified atrial fibrillation: Secondary | ICD-10-CM | POA: Diagnosis not present

## 2015-02-10 DIAGNOSIS — I5022 Chronic systolic (congestive) heart failure: Secondary | ICD-10-CM | POA: Diagnosis not present

## 2015-02-10 LAB — HEPATIC FUNCTION PANEL
ALT: 16 U/L (ref 0–35)
AST: 28 U/L (ref 0–37)
Albumin: 3.6 g/dL (ref 3.5–5.2)
Alkaline Phosphatase: 88 U/L (ref 39–117)
Bilirubin, Direct: <0.1 mg/dL (ref 0.0–0.5)
Total Bilirubin: 0.4 mg/dL (ref 0.3–1.2)
Total Protein: 6.9 g/dL (ref 6.0–8.3)

## 2015-02-10 LAB — TSH: TSH: 0.877 u[IU]/mL (ref 0.350–4.500)

## 2015-02-10 MED ORDER — AMIODARONE HCL 200 MG PO TABS: 100.0000 mg | ORAL_TABLET | Freq: Every day | ORAL | Status: DC

## 2015-02-10 NOTE — Patient Instructions (Signed)
Decrease Amiodarone to 100 mg (1/2 tab) daily  Labs today  Your physician recommends that you schedule a follow-up appointment in: 1 month  

## 2015-02-11 NOTE — Progress Notes (Signed)
Patient ID: Jacqueline Reilly, female   DOB: 1923-11-27, 81 y.o.   MRN: [id number] PCP: Jacqueline Reilly  HPI: [name] is an 81 y/o woman with CAD, AAA, systolic HF, mild dementia, afib and CAD. She has had two previous coronary interventions including a cutting balloon to her D2 in 2007 and a DES to her mid LCX in 2010. Her LV dysfunction has been out of proportion to her CAD.  Last echo in 7/15 showed EF 20-25% with severe LV dilation, mild MR, and PA systolic pressure 38 mmHg.   Admitted 7/11-7/24/15 with syncope and dyspnea. She had new onset A-fib/RVR. Troponin was 1.03> 1.59> 2.18 . She was placed on amiodarone and heparin drip. She developed respiratory distress and required intubation. She converted to NSR but later was bradycardiac with NSVT. Amiodarone temporarily stopped but restarted after hyperkalemia resolved. Hyperkalemia was thought to be contributing to bradycardia. Diuresed with IV lasix. Co-ox 29% and started on milrinone which we tried to wean off but she did not tolerate. Discharge weight 87 lbs.   She returns for follow up. She is stable on home milrinone. She comes today with her brother.  He says that her memory seems to be getting worse and that she is having visual hallucinations.  He wants to know if we can cut out any of her medications.  Her husband has dismissed her home care attendant.   She walks with a cane and has no dyspnea walking on flat ground.  No orthopnea or PND.  No chest pain.  Weight is stable.   Labs: (05/23/14) K+ 4.3, creatinine 1.06, digoxin 1.1 (8/15) K 4.7, creatinine 0.94, HCT 33.2 (9/15) K 3.4, creatinine .80 (10/1/0/2015) K 5.0 Creatinine 0.87 Magnesium 2.4  08/11/14 K 5.3 Creatinine 0.8 08/25/14 K 4.4 Cr 0.85 09/04/14   K 4.0 Cr 0.8 pBNP 1763 11/02/14  K 4.2 Cr 0.85 3/16 HCT 32.6, K 4.2, creatinine 0.82  ECG: NSR, poor RWP, LVH with repolarization abnormality  ROS: All systems negative except as listed in HPI, PMH and Problem List.  SH:   History   Social History  . Marital Status: Married    Spouse Name: N/A  . Number of Children: 1  . Years of Education: N/A   Occupational History  . retired    Social History Main Topics  . Smoking status: Never Smoker   . Smokeless tobacco: Never Used  . Alcohol Use: Yes     Comment: occasionally  . Drug Use: No  . Sexual Activity: Not on file   Other Topics Concern  . Not on file   Social History Narrative   She lives in [place], family helps with her care.    FH:  Family History  Problem Relation Age of Onset  . Heart disease Mother   . Heart disease Father   . Heart disease Brother     x 3    Past Medical History  Diagnosis Date  . PVD (peripheral vascular disease)   . Ventricular dysfunction     left; ischemic  . Hyperlipidemia   . AAA (abdominal aortic aneurysm)   . Renal artery stenosis   . Hypertension   . Coronary artery disease 2007    moderate ASCAD of the left system s/p PCI of the D2 and PCI of the left circ 03/2009  . Chronic systolic heart failure     a. ECHO (04/2014) EF 20-25%, diff HK, mild MR  . Atrial fibrillation     Current Outpatient  Prescriptions  Medication Sig Dispense Refill  . acetaminophen (TYLENOL) 500 MG tablet Take 1 tablet (500 mg total) by mouth every 6 (six) hours as needed (for pain). 30 tablet 1  . amiodarone (PACERONE) 200 MG tablet Take 0.5 tablets (100 mg total) by mouth daily. 90 tablet 3  . apixaban (ELIQUIS) 2.5 MG TABS tablet Take 1 tablet (2.5 mg total) by mouth 2 (two) times daily. 60 tablet 6  . feeding supplement, ENSURE COMPLETE, (ENSURE COMPLETE) LIQD Take 237 mLs by mouth 2 (two) times daily between meals.    Marland Kitchen lisinopril (ZESTRIL) 2.5 MG tablet Take 1 tablet (2.5 mg total) by mouth daily. 90 tablet 3  . milrinone (PRIMACOR) 20 MG/100ML SOLN infusion Inject 4.95 mcg/min into the vein continuous. Per [facility] 100 mL 3  . Multiple Vitamin (MULTIVITAMIN) tablet Take 1 tablet by mouth daily. 30  tablet 1  . nitroGLYCERIN (NITROSTAT) 0.4 MG SL tablet Place 1 tablet (0.4 mg total) under the tongue every 5 (five) minutes as needed for chest pain. 15 tablet 1  . Omega-3 Fatty Acids (FISH OIL) 1200 MG CAPS Take 1 capsule (1,200 mg total) by mouth daily. 30 capsule 1  . potassium chloride SA (KLOR-CON M20) 20 MEQ tablet Take 1 tablet (20 mEq total) by mouth daily. 90 tablet 3  . torsemide (DEMADEX) 20 MG tablet Take 2 tablets (40 mg total) by mouth 2 (two) times daily. 360 tablet 3   No current facility-administered medications for this encounter.    Filed Vitals:   02/10/15 1050  BP: 124/64  Pulse: 67  Weight: 91 lb 12 oz (41.618 kg)  SpO2: 92%    PHYSICAL EXAM:  General:  Elderly appearing. Frail, No resp difficulty; husband and brother present. Ambulated in the clinic with a walker.  HEENT: normal Neck: supple. JVP 7. Carotids 2+ bilaterally; no bruits. No lymphadenopathy or thryomegaly appreciated. Cor: PMI normal. Regular rate & rhythm. No rubs, gallops.  2/6 SEM apex Lungs: clear Abdomen: soft, nontender, nondistended. No hepatosplenomegaly. No bruits or masses. Good bowel sounds. Extremities: no cyanosis, clubbing, rash, edema LUE PICC Neuro: alert & orientedx3, cranial nerves grossly intact. Moves all 4 extremities w/o difficulty. Affect pleasant.   ASSESSMENT & PLAN:   1) Chronic Systolic Heart Failure: Mixed ischemic/nonischemic cardiomyopathy (degree of LV dysfunction is out of proportion to CAD), EF 25-30% (04/2014 echo).  Doing well, stable NYHA II-III symptoms and volume status stable on milrinone. - Continue current milrinone gtt. - Continue torsemide 40 mg bid.  - Continue lisinopril 2.5 mg daily.  - Not on beta blocker at this time with low output.  2) Atrial fibrillation: Paroxysmal.  Now in NSR on amiodarone.  Continue apixaban 2.5 mg bid (weight less than 60 kg and age >58). Can decrease amiodarone to 100 mg daily.  Check LFTs/TSH today.  Needs yearly eye  exams.  3) Limited Code: No CPR or defibrillation 4) CAD: No chest pain. Off ASA with Eliquis.   Loralie Champagne MD  02/11/2015

## 2015-02-13 DIAGNOSIS — F0390 Unspecified dementia without behavioral disturbance: Secondary | ICD-10-CM | POA: Diagnosis not present

## 2015-02-15 DIAGNOSIS — I509 Heart failure, unspecified: Secondary | ICD-10-CM | POA: Diagnosis not present

## 2015-02-15 DIAGNOSIS — Z452 Encounter for adjustment and management of vascular access device: Secondary | ICD-10-CM | POA: Diagnosis not present

## 2015-02-15 DIAGNOSIS — I714 Abdominal aortic aneurysm, without rupture: Secondary | ICD-10-CM | POA: Diagnosis not present

## 2015-02-15 DIAGNOSIS — I252 Old myocardial infarction: Secondary | ICD-10-CM | POA: Diagnosis not present

## 2015-02-15 DIAGNOSIS — I5043 Acute on chronic combined systolic (congestive) and diastolic (congestive) heart failure: Secondary | ICD-10-CM | POA: Diagnosis not present

## 2015-02-15 DIAGNOSIS — F0390 Unspecified dementia without behavioral disturbance: Secondary | ICD-10-CM | POA: Diagnosis not present

## 2015-02-15 DIAGNOSIS — I2510 Atherosclerotic heart disease of native coronary artery without angina pectoris: Secondary | ICD-10-CM | POA: Diagnosis not present

## 2015-02-21 ENCOUNTER — Encounter: Payer: Self-pay | Admitting: Internal Medicine

## 2015-02-22 ENCOUNTER — Other Ambulatory Visit (HOSPITAL_COMMUNITY): Payer: Self-pay | Admitting: *Deleted

## 2015-02-22 DIAGNOSIS — F0390 Unspecified dementia without behavioral disturbance: Secondary | ICD-10-CM | POA: Diagnosis not present

## 2015-02-22 DIAGNOSIS — I5043 Acute on chronic combined systolic (congestive) and diastolic (congestive) heart failure: Secondary | ICD-10-CM | POA: Diagnosis not present

## 2015-02-22 DIAGNOSIS — I2510 Atherosclerotic heart disease of native coronary artery without angina pectoris: Secondary | ICD-10-CM | POA: Diagnosis not present

## 2015-02-22 DIAGNOSIS — I714 Abdominal aortic aneurysm, without rupture: Secondary | ICD-10-CM | POA: Diagnosis not present

## 2015-02-22 DIAGNOSIS — I509 Heart failure, unspecified: Secondary | ICD-10-CM | POA: Diagnosis not present

## 2015-02-22 DIAGNOSIS — I252 Old myocardial infarction: Secondary | ICD-10-CM | POA: Diagnosis not present

## 2015-02-22 DIAGNOSIS — Z452 Encounter for adjustment and management of vascular access device: Secondary | ICD-10-CM | POA: Diagnosis not present

## 2015-02-22 MED ORDER — AMIODARONE HCL 100 MG PO TABS: 50.0000 mg | ORAL_TABLET | Freq: Every day | ORAL | Status: DC

## 2015-02-26 DIAGNOSIS — Z452 Encounter for adjustment and management of vascular access device: Secondary | ICD-10-CM | POA: Diagnosis not present

## 2015-02-26 DIAGNOSIS — I10 Essential (primary) hypertension: Secondary | ICD-10-CM | POA: Diagnosis not present

## 2015-02-26 DIAGNOSIS — I2510 Atherosclerotic heart disease of native coronary artery without angina pectoris: Secondary | ICD-10-CM | POA: Diagnosis not present

## 2015-02-26 DIAGNOSIS — I5043 Acute on chronic combined systolic (congestive) and diastolic (congestive) heart failure: Secondary | ICD-10-CM | POA: Diagnosis not present

## 2015-02-26 DIAGNOSIS — I4891 Unspecified atrial fibrillation: Secondary | ICD-10-CM | POA: Diagnosis not present

## 2015-02-26 DIAGNOSIS — I252 Old myocardial infarction: Secondary | ICD-10-CM | POA: Diagnosis not present

## 2015-02-26 DIAGNOSIS — I714 Abdominal aortic aneurysm, without rupture: Secondary | ICD-10-CM | POA: Diagnosis not present

## 2015-02-26 DIAGNOSIS — F0390 Unspecified dementia without behavioral disturbance: Secondary | ICD-10-CM | POA: Diagnosis not present

## 2015-02-28 ENCOUNTER — Other Ambulatory Visit: Payer: Self-pay | Admitting: Cardiology

## 2015-03-01 ENCOUNTER — Other Ambulatory Visit: Payer: Self-pay | Admitting: Cardiology

## 2015-03-01 DIAGNOSIS — I252 Old myocardial infarction: Secondary | ICD-10-CM | POA: Diagnosis not present

## 2015-03-01 DIAGNOSIS — F0390 Unspecified dementia without behavioral disturbance: Secondary | ICD-10-CM | POA: Diagnosis not present

## 2015-03-01 DIAGNOSIS — I714 Abdominal aortic aneurysm, without rupture: Secondary | ICD-10-CM | POA: Diagnosis not present

## 2015-03-01 DIAGNOSIS — I509 Heart failure, unspecified: Secondary | ICD-10-CM | POA: Diagnosis not present

## 2015-03-01 DIAGNOSIS — Z452 Encounter for adjustment and management of vascular access device: Secondary | ICD-10-CM | POA: Diagnosis not present

## 2015-03-01 DIAGNOSIS — I5043 Acute on chronic combined systolic (congestive) and diastolic (congestive) heart failure: Secondary | ICD-10-CM | POA: Diagnosis not present

## 2015-03-01 DIAGNOSIS — I2510 Atherosclerotic heart disease of native coronary artery without angina pectoris: Secondary | ICD-10-CM | POA: Diagnosis not present

## 2015-03-02 ENCOUNTER — Encounter (HOSPITAL_COMMUNITY): Payer: 59

## 2015-03-02 DIAGNOSIS — I5043 Acute on chronic combined systolic (congestive) and diastolic (congestive) heart failure: Secondary | ICD-10-CM | POA: Diagnosis not present

## 2015-03-02 DIAGNOSIS — Z452 Encounter for adjustment and management of vascular access device: Secondary | ICD-10-CM | POA: Diagnosis not present

## 2015-03-02 DIAGNOSIS — I2510 Atherosclerotic heart disease of native coronary artery without angina pectoris: Secondary | ICD-10-CM | POA: Diagnosis not present

## 2015-03-02 DIAGNOSIS — I252 Old myocardial infarction: Secondary | ICD-10-CM | POA: Diagnosis not present

## 2015-03-02 DIAGNOSIS — I714 Abdominal aortic aneurysm, without rupture: Secondary | ICD-10-CM | POA: Diagnosis not present

## 2015-03-02 DIAGNOSIS — F0390 Unspecified dementia without behavioral disturbance: Secondary | ICD-10-CM | POA: Diagnosis not present

## 2015-03-03 ENCOUNTER — Emergency Department (HOSPITAL_COMMUNITY)
Admission: EM | Admit: 2015-03-03 | Discharge: 2015-03-03 | Disposition: A | Discharge status: Home / Self Care | Payer: Medicare Other | Attending: Emergency Medicine | Admitting: Emergency Medicine

## 2015-03-03 ENCOUNTER — Emergency Department (HOSPITAL_COMMUNITY): Payer: Medicare Other

## 2015-03-03 ENCOUNTER — Encounter (HOSPITAL_COMMUNITY): Payer: Self-pay

## 2015-03-03 DIAGNOSIS — I2510 Atherosclerotic heart disease of native coronary artery without angina pectoris: Secondary | ICD-10-CM | POA: Diagnosis not present

## 2015-03-03 DIAGNOSIS — I5022 Chronic systolic (congestive) heart failure: Secondary | ICD-10-CM | POA: Diagnosis not present

## 2015-03-03 DIAGNOSIS — R9431 Abnormal electrocardiogram [ECG] [EKG]: Secondary | ICD-10-CM | POA: Diagnosis not present

## 2015-03-03 DIAGNOSIS — Z8639 Personal history of other endocrine, nutritional and metabolic disease: Secondary | ICD-10-CM | POA: Diagnosis not present

## 2015-03-03 DIAGNOSIS — Z9861 Coronary angioplasty status: Secondary | ICD-10-CM | POA: Insufficient documentation

## 2015-03-03 DIAGNOSIS — F0390 Unspecified dementia without behavioral disturbance: Secondary | ICD-10-CM | POA: Diagnosis not present

## 2015-03-03 DIAGNOSIS — I10 Essential (primary) hypertension: Secondary | ICD-10-CM | POA: Insufficient documentation

## 2015-03-03 DIAGNOSIS — R112 Nausea with vomiting, unspecified: Secondary | ICD-10-CM

## 2015-03-03 DIAGNOSIS — Z7902 Long term (current) use of antithrombotics/antiplatelets: Secondary | ICD-10-CM | POA: Diagnosis not present

## 2015-03-03 DIAGNOSIS — Z79899 Other long term (current) drug therapy: Secondary | ICD-10-CM | POA: Insufficient documentation

## 2015-03-03 LAB — COMPREHENSIVE METABOLIC PANEL
ALT: 14 U/L (ref 14–54)
AST: 24 U/L (ref 15–41)
Albumin: 4 g/dL (ref 3.5–5.0)
Alkaline Phosphatase: 93 U/L (ref 38–126)
Anion gap: 8 (ref 5–15)
BUN: 20 mg/dL (ref 6–20)
CO2: 31 mmol/L (ref 22–32)
Calcium: 8.7 mg/dL — ABNORMAL LOW (ref 8.9–10.3)
Chloride: 100 mmol/L — ABNORMAL LOW (ref 101–111)
Creatinine, Ser: 0.99 mg/dL (ref 0.44–1.00)
GFR calc Af Amer: 57 mL/min — ABNORMAL LOW (ref >60)
GFR calc non Af Amer: 49 mL/min — ABNORMAL LOW (ref >60)
Glucose, Bld: 159 mg/dL — ABNORMAL HIGH (ref 70–99)
Potassium: 3.1 mmol/L — ABNORMAL LOW (ref 3.5–5.1)
Sodium: 139 mmol/L (ref 135–145)
Total Bilirubin: 0.6 mg/dL (ref 0.3–1.2)
Total Protein: 7.2 g/dL (ref 6.5–8.1)

## 2015-03-03 LAB — CBC WITH DIFFERENTIAL/PLATELET
Basophils Absolute: 0 10*3/uL (ref 0.0–0.1)
Basophils Relative: 0 % (ref 0–1)
Eosinophils Absolute: 0 10*3/uL (ref 0.0–0.7)
Eosinophils Relative: 0 % (ref 0–5)
HCT: 37 % (ref 36.0–46.0)
Hemoglobin: 11.2 g/dL — ABNORMAL LOW (ref 12.0–15.0)
Lymphocytes Relative: 7 % — ABNORMAL LOW (ref 12–46)
Lymphs Abs: 0.4 10*3/uL — ABNORMAL LOW (ref 0.7–4.0)
MCH: 25.1 pg — ABNORMAL LOW (ref 26.0–34.0)
MCHC: 30.3 g/dL (ref 30.0–36.0)
MCV: 83 fL (ref 78.0–100.0)
Monocytes Absolute: 0.2 10*3/uL (ref 0.1–1.0)
Monocytes Relative: 3 % (ref 3–12)
Neutro Abs: 5.6 10*3/uL (ref 1.7–7.7)
Neutrophils Relative %: 90 % — ABNORMAL HIGH (ref 43–77)
Platelets: 228 10*3/uL (ref 150–400)
RBC: 4.46 MIL/uL (ref 3.87–5.11)
RDW: 16.6 % — ABNORMAL HIGH (ref 11.5–15.5)
WBC: 6.2 10*3/uL (ref 4.0–10.5)

## 2015-03-03 LAB — URINALYSIS, ROUTINE W REFLEX MICROSCOPIC
Bilirubin Urine: NEGATIVE
Glucose, UA: NEGATIVE mg/dL
Hgb urine dipstick: NEGATIVE
Ketones, ur: NEGATIVE mg/dL
Leukocytes, UA: NEGATIVE
Nitrite: NEGATIVE
Protein, ur: NEGATIVE mg/dL
Specific Gravity, Urine: 1.008 (ref 1.005–1.030)
Urobilinogen, UA: 0.2 mg/dL (ref 0.0–1.0)
pH: 7 (ref 5.0–8.0)

## 2015-03-03 LAB — LIPASE, BLOOD: Lipase: 23 U/L (ref 22–51)

## 2015-03-03 LAB — BRAIN NATRIURETIC PEPTIDE: B Natriuretic Peptide: 249 pg/mL — ABNORMAL HIGH (ref 0.0–100.0)

## 2015-03-03 LAB — I-STAT CG4 LACTIC ACID, ED: Lactic Acid, Venous: 1.68 mmol/L (ref 0.5–2.0)

## 2015-03-03 MED ORDER — ONDANSETRON HCL 4 MG/2ML IJ SOLN
4.0000 mg | Freq: Once | INTRAMUSCULAR | Status: AC
  Administered 2015-03-03: 4 mg via INTRAVENOUS
  Filled 2015-03-03: qty 2

## 2015-03-03 MED ORDER — POTASSIUM CHLORIDE 10 MEQ/100ML IV SOLN
10.0000 meq | INTRAVENOUS | Status: AC
  Administered 2015-03-03 (×2): 10 meq via INTRAVENOUS
  Filled 2015-03-03: qty 100

## 2015-03-03 MED ORDER — ONDANSETRON 4 MG PO TBDP: 4.0000 mg | ORAL_TABLET | Freq: Three times a day (TID) | ORAL | Status: DC | PRN

## 2015-03-03 MED ORDER — SODIUM CHLORIDE 0.9 % IV SOLN
INTRAVENOUS | Status: DC
  Administered 2015-03-03: 08:00:00 via INTRAVENOUS

## 2015-03-03 NOTE — ED Notes (Signed)
Patient transported to CT 

## 2015-03-03 NOTE — ED Notes (Signed)
MD at bedside. 

## 2015-03-03 NOTE — ED Notes (Signed)
Per [employer], she spoke to pt's brother worker went by [employer] and the house is at comfortable temperature for pt to come home to.   Told Kristen that i would contact [facility] for transport.

## 2015-03-03 NOTE — ED Provider Notes (Signed)
CSN: [id number]     Arrival date & time 03/03/15  [id number] History   First MD Initiated Contact with Patient 03/03/15 0701     Chief Complaint  Patient presents with  . Nausea  . Emesis     (Consider location/radiation/quality/duration/timing/severity/associated sxs/prior Treatment) HPI   Patient presents with her husband and brother. He husband has dementia, the patient has dementia, level V caveat. Seemingly, over the past day the patient has had increased weakness, nausea, vomiting, with minimal oral intake, no medication intake.  The patient's brother states that the husband is the primary caregiver, though with coincidental dementia, recent history is difficult to ascertain. Seemingly, the patient has a history of heart failure, atrial fibrillation. She seems to be taking Eliquis, amiodarone.  There are reports of chills, no objective fever, unclear if there is pain.   Past Medical History  Diagnosis Date  . PVD (peripheral vascular disease)   . Ventricular dysfunction     left; ischemic  . Hyperlipidemia   . AAA (abdominal aortic aneurysm)   . Renal artery stenosis   . Hypertension   . Coronary artery disease 2007    moderate ASCAD of the left system s/p PCI of the D2 and PCI of the left circ 03/2009  . Chronic systolic heart failure     a. ECHO (04/2014) EF 20-25%, diff HK, mild MR  . Atrial fibrillation    Past Surgical History  Procedure Laterality Date  . Retinal cryopexy      right eye (for retinal detachment)  . Angioplasty    . Coronary stent placement     Family History  Problem Relation Age of Onset  . Heart disease Mother   . Heart disease Father   . Heart disease Brother     x 3   History  Substance Use Topics  . Smoking status: Never Smoker   . Smokeless tobacco: Never Used  . Alcohol Use: Yes     Comment: occasionally   OB History    No data available     Review of Systems  Unable to perform ROS: Dementia      Allergies  Codeine and  Garlic  Home Medications   Prior to Admission medications   Medication Sig Start Date End Date Taking? Authorizing Provider  acetaminophen (TYLENOL) 500 MG tablet Take 1 tablet (500 mg total) by mouth every 6 (six) hours as needed (for pain). 07/26/14   Larey Dresser, MD  amiodarone (PACERONE) 100 MG tablet Take 0.5 tablets (50 mg total) by mouth daily. 02/22/15   Larey Dresser, MD  apixaban (ELIQUIS) 2.5 MG TABS tablet Take 1 tablet (2.5 mg total) by mouth 2 (two) times daily. 07/26/14   Larey Dresser, MD  feeding supplement, ENSURE COMPLETE, (ENSURE COMPLETE) LIQD Take 237 mLs by mouth 2 (two) times daily between meals. 05/20/14   Rhonda G Barrett, PA-C  lisinopril (ZESTRIL) 2.5 MG tablet Take 1 tablet (2.5 mg total) by mouth daily. 11/23/14   Jolaine Artist, MD  milrinone [facility]) 20 MG/100ML SOLN infusion Inject 4.95 mcg/min into the vein continuous. Per [facility] 05/20/14   Evelene Croon Barrett, PA-C  Multiple Vitamin (MULTIVITAMIN) tablet Take 1 tablet by mouth daily. 07/26/14   Larey Dresser, MD  nitroGLYCERIN (NITROSTAT) 0.4 MG SL tablet Place 1 tablet (0.4 mg total) under the tongue every 5 (five) minutes as needed for chest pain. 07/26/14   Larey Dresser, MD  Omega-3 Fatty Acids (FISH OIL) 1200  MG CAPS Take 1 capsule (1,200 mg total) by mouth daily. 07/26/14   Larey Dresser, MD  potassium chloride SA (KLOR-CON M20) 20 MEQ tablet Take 1 tablet (20 mEq total) by mouth daily. 07/26/14   Larey Dresser, MD  torsemide (DEMADEX) 20 MG tablet TAKE TWO TABLETS BY MOUTH TWICE DAILY 02/28/15   Shaune Pascal Bensimhon, MD   BP 163/76 mmHg  Pulse 59  Temp(Src) 97.8 F (36.6 C) (Oral)  Resp 22  SpO2 98% Physical Exam  Constitutional: She appears listless. She has a sickly appearance.  HENT:  Head: Normocephalic and atraumatic.  Eyes: Conjunctivae and EOM are normal.  Cardiovascular: Normal rate and regular rhythm.   Pulmonary/Chest: Effort normal and breath sounds normal. No  stridor. No respiratory distress.  Abdominal: She exhibits no distension.  Musculoskeletal: She exhibits no edema.  Neurological: She appears listless. She displays atrophy. She displays no tremor. No cranial nerve deficit. She exhibits abnormal muscle tone. She displays no seizure activity.  No facial asymmetry, minimal speech, patient does respond to painful stimuli, and when awake, to direct verbal question. She moves all extremities spontaneously, minimally.  Skin: Skin is warm and dry.  Psychiatric: Cognition and memory are impaired.  Nursing note and vitals reviewed.   ED Course  Procedures (including critical care time) Labs Review Labs Reviewed  COMPREHENSIVE METABOLIC PANEL - Abnormal; Notable for the following:    Potassium 3.1 (*)    Chloride 100 (*)    Glucose, Bld 159 (*)    Calcium 8.7 (*)    GFR calc non Af Amer 49 (*)    GFR calc Af Amer 57 (*)    All other components within normal limits  CBC WITH DIFFERENTIAL/PLATELET - Abnormal; Notable for the following:    Hemoglobin 11.2 (*)    MCH 25.1 (*)    RDW 16.6 (*)    Neutrophils Relative % 90 (*)    Lymphocytes Relative 7 (*)    Lymphs Abs 0.4 (*)    All other components within normal limits  BRAIN NATRIURETIC PEPTIDE - Abnormal; Notable for the following:    B Natriuretic Peptide 249.0 (*)    All other components within normal limits  LIPASE, BLOOD  URINALYSIS, ROUTINE W REFLEX MICROSCOPIC  TROPONIN I  I-STAT CG4 LACTIC ACID, ED    Imaging Review Ct Head Wo Contrast  03/03/2015   CLINICAL DATA:  81 year old female with a history of nausea and vomiting for 1 day  EXAM: CT HEAD WITHOUT CONTRAST  TECHNIQUE: Contiguous axial images were obtained from the base of the skull through the vertex without intravenous contrast.  COMPARISON:  06/17/2014, 01/27/2014  FINDINGS: Unremarkable appearance of the calvarium without acute fracture or aggressive lesion.  Unremarkable appearance of the scalp soft tissues.   Unremarkable appearance of the bilateral orbits.  Surgical changes of right scleral banding.  Mastoid air cells are clear.  No significant paranasal sinus disease  No acute intracranial hemorrhage, midline shift, mass effect.  Re- demonstration of senescent brain volume loss with expansion of the ventricles.  Gray-white differentiation maintained.  Intracranial atherosclerotic calcifications.  Re- demonstration of focal hypodensity of the right anterior limb internal capsule, potentially representing a prior infarction.  IMPRESSION: No CT evidence of acute intracranial abnormality.  Intracranial atherosclerosis.  Signed,  Dulcy Fanny. Earleen Newport, DO  [facility]  [facility]   Electronically Signed   By: Corrie Mckusick D.O.   On: 03/03/2015 07:57     EKG Interpretation  Date/Time:  Friday Mar 03 2015 06:45:41 EDT Ventricular Rate:  58 PR Interval:  258 QRS Duration: 154 QT Interval:  518 QTC Calculation: 509 R Axis:   -54 Text Interpretation:  Sinus rhythm Prolonged PR interval Left bundle  branch block Sinus rhythm Left bundle branch block T wave abnormality QT  prolonged Abnormal ekg Confirmed by Carmin Muskrat  MD ([id number]) on  03/03/2015 7:21:23 AM     Pulse oximetry 95% room air borderline Cardiac 60 sinus rhythm normal  After the initial eval, the patient's brother expresses concern about the patient and her husband - given their dual dementia, and concern for her health.  I consulted SW.  Cardiology Clinic Note 02/11/15 1) Chronic Systolic Heart Failure: Mixed ischemic/nonischemic cardiomyopathy (degree of LV dysfunction is out of proportion to CAD), EF 25-30% (04/2014 echo).  Doing well, stable NYHA II-III symptoms and volume status stable on milrinone. - Continue current milrinone gtt. - Continue torsemide 40 mg bid.   - Continue lisinopril 2.5 mg daily.   - Not on beta blocker at this time with low output.  2) Atrial fibrillation:  Paroxysmal.  Now in NSR on amiodarone.  Continue apixaban 2.5 mg bid (weight less than 60 kg and age >31). Can decrease amiodarone to 100 mg daily.  Check LFTs/TSH today.  Needs yearly eye exams.  3) Limited Code: No CPR or defibrillation 4) CAD: No chest pain. Off ASA with Eliquis.    Loralie Champagne MD  02/11/2015  9:18 AM I discussed the patient's case w SW. IV potassium running  10:11 AM Social worker is facilitating discussion with home health services, Adult Scientist, forensic. On repeat exam the patient is in similar condition, resting, no vomiting since arrival.    MDM   Final diagnoses:  Non-intractable vomiting with nausea, vomiting of unspecified type   patient presents with family members provide the history of present illness. Patient is 81 years old, has multiple medical issues, including advanced heart failure. Patient is DO NOT RESUSCITATE. Patient has appropriate medical management for CHF. Patient has concerns for nausea and vomiting, but no evidence for acute abdomen on physical exam, and she is afebrile. Patient has no vomiting for hours of monitoring in the emergency department. With resolution of symptoms, following fluid resuscitation, antiemetics, and after discussion with social worker, patient was discharged home, for primary care follow-up, home health evaluation.  Carmin Muskrat, MD 03/03/15 1137

## 2015-03-03 NOTE — Progress Notes (Addendum)
CSW met with pt and pt husband. Per EDP, patient and patient husband both suffer with dementia and pt spouse is primary care giver. Pt spouse shared that patient has times of agitation and he will lock himself in the study until patient calms down while she is throwing things and hitting things with her cane until she exhausts herself. Pt spouse shared that sometimes she can be "very child like and beautiful other times she is nasty and terrible."  Patient brother shares that patient spouse is very difficult to work with and if he feels you are too push he will shut you off. Patient brother shares that he is concerned over pt spouse giving pt her medication daily as he has dropped pills and mis given dosages. Pt brother states that he comes to check on patient, and often is able to calm patient down and help with care, however pt spouse is controlling and often instigates agitation.Pt spouse directed to follow up with [employer]. CSW spoke with pt brother who shared that pt house is comfortable and does not need heat. There is concern about pt spouse having dementia and not understanding thermostat and bill. Pt has gas water heater as well, as it is working well. CSW making APS report and will include information regarding patient confusion about needing utilities, heat/air.  Belia Heman, [place] Work  [employer] [phone]

## 2015-03-03 NOTE — ED Notes (Signed)
Bed: [id number] Expected date:  Expected time:  Means of arrival:  Comments: EMS elderly female N/V

## 2015-03-03 NOTE — Discharge Instructions (Signed)
As discussed, your evaluation today has been largely reassuring.  But, it is important that you monitor your condition carefully, and do not hesitate to return to the ED if you develop new, or concerning changes in your condition. ° °Otherwise, please follow-up with your physician Monday for appropriate ongoing care. ° °

## 2015-03-03 NOTE — Clinical Social Work Note (Signed)
Entered in error

## 2015-03-03 NOTE — ED Notes (Signed)
Pt's spouse states that they are having an on going dispute with [employer] about the amount of money they owe them.  Pt states that their heat and hot water are natural gas.  Pt states that [place] has cut of the heat to their house but has left on the hot water.  Pt states that the house is cold and the pt can't go back to the house with it being cold.   Made Kristen with social work aware.  Social worker will be coming to speak with pt's spouse.

## 2015-03-07 ENCOUNTER — Encounter: Payer: Self-pay | Admitting: Internal Medicine

## 2015-03-08 DIAGNOSIS — F0390 Unspecified dementia without behavioral disturbance: Secondary | ICD-10-CM | POA: Diagnosis not present

## 2015-03-08 DIAGNOSIS — I509 Heart failure, unspecified: Secondary | ICD-10-CM | POA: Diagnosis not present

## 2015-03-08 DIAGNOSIS — I2510 Atherosclerotic heart disease of native coronary artery without angina pectoris: Secondary | ICD-10-CM | POA: Diagnosis not present

## 2015-03-08 DIAGNOSIS — I714 Abdominal aortic aneurysm, without rupture: Secondary | ICD-10-CM | POA: Diagnosis not present

## 2015-03-08 DIAGNOSIS — I252 Old myocardial infarction: Secondary | ICD-10-CM | POA: Diagnosis not present

## 2015-03-08 DIAGNOSIS — Z452 Encounter for adjustment and management of vascular access device: Secondary | ICD-10-CM | POA: Diagnosis not present

## 2015-03-08 DIAGNOSIS — I5043 Acute on chronic combined systolic (congestive) and diastolic (congestive) heart failure: Secondary | ICD-10-CM | POA: Diagnosis not present

## 2015-03-13 ENCOUNTER — Encounter (HOSPITAL_COMMUNITY): Payer: 59

## 2015-03-15 DIAGNOSIS — I2510 Atherosclerotic heart disease of native coronary artery without angina pectoris: Secondary | ICD-10-CM | POA: Diagnosis not present

## 2015-03-15 DIAGNOSIS — F0390 Unspecified dementia without behavioral disturbance: Secondary | ICD-10-CM | POA: Diagnosis not present

## 2015-03-15 DIAGNOSIS — Z452 Encounter for adjustment and management of vascular access device: Secondary | ICD-10-CM | POA: Diagnosis not present

## 2015-03-15 DIAGNOSIS — I252 Old myocardial infarction: Secondary | ICD-10-CM | POA: Diagnosis not present

## 2015-03-15 DIAGNOSIS — I5042 Chronic combined systolic (congestive) and diastolic (congestive) heart failure: Secondary | ICD-10-CM | POA: Diagnosis not present

## 2015-03-15 DIAGNOSIS — I5043 Acute on chronic combined systolic (congestive) and diastolic (congestive) heart failure: Secondary | ICD-10-CM | POA: Diagnosis not present

## 2015-03-15 DIAGNOSIS — I714 Abdominal aortic aneurysm, without rupture: Secondary | ICD-10-CM | POA: Diagnosis not present

## 2015-03-20 ENCOUNTER — Ambulatory Visit (HOSPITAL_COMMUNITY)
Admission: RE | Admit: 2015-03-20 | Discharge: 2015-03-20 | Disposition: A | Discharge status: Home / Self Care | Payer: Medicare Other | Source: Ambulatory Visit | Attending: Cardiology | Admitting: Cardiology

## 2015-03-20 ENCOUNTER — Encounter (HOSPITAL_COMMUNITY): Payer: Self-pay

## 2015-03-20 VITALS — BP 128/68 | HR 74 | Resp 18 | Wt 90.5 lb

## 2015-03-20 DIAGNOSIS — Z79899 Other long term (current) drug therapy: Secondary | ICD-10-CM | POA: Insufficient documentation

## 2015-03-20 DIAGNOSIS — F0390 Unspecified dementia without behavioral disturbance: Secondary | ICD-10-CM | POA: Diagnosis not present

## 2015-03-20 DIAGNOSIS — I429 Cardiomyopathy, unspecified: Secondary | ICD-10-CM | POA: Insufficient documentation

## 2015-03-20 DIAGNOSIS — Z7901 Long term (current) use of anticoagulants: Secondary | ICD-10-CM | POA: Insufficient documentation

## 2015-03-20 DIAGNOSIS — I2583 Coronary atherosclerosis due to lipid rich plaque: Secondary | ICD-10-CM

## 2015-03-20 DIAGNOSIS — R54 Age-related physical debility: Secondary | ICD-10-CM | POA: Insufficient documentation

## 2015-03-20 DIAGNOSIS — I739 Peripheral vascular disease, unspecified: Secondary | ICD-10-CM | POA: Diagnosis not present

## 2015-03-20 DIAGNOSIS — I480 Paroxysmal atrial fibrillation: Secondary | ICD-10-CM | POA: Diagnosis not present

## 2015-03-20 DIAGNOSIS — I714 Abdominal aortic aneurysm, without rupture: Secondary | ICD-10-CM | POA: Insufficient documentation

## 2015-03-20 DIAGNOSIS — I701 Atherosclerosis of renal artery: Secondary | ICD-10-CM | POA: Insufficient documentation

## 2015-03-20 DIAGNOSIS — I5022 Chronic systolic (congestive) heart failure: Secondary | ICD-10-CM

## 2015-03-20 DIAGNOSIS — I10 Essential (primary) hypertension: Secondary | ICD-10-CM | POA: Diagnosis not present

## 2015-03-20 DIAGNOSIS — I2510 Atherosclerotic heart disease of native coronary artery without angina pectoris: Secondary | ICD-10-CM | POA: Diagnosis not present

## 2015-03-20 DIAGNOSIS — E785 Hyperlipidemia, unspecified: Secondary | ICD-10-CM | POA: Insufficient documentation

## 2015-03-20 NOTE — Patient Instructions (Signed)
Doing great!  Follow up 6 weeks with an echocardiogram.  Do the following things EVERYDAY: 1) Weigh yourself in the morning before breakfast. Write it down and keep it in a log. 2) Take your medicines as prescribed 3) Eat low salt foods-Limit salt (sodium) to 2000 mg per day.  4) Stay as active as you can everyday 5) Limit all fluids for the day to less than 2 liters

## 2015-03-20 NOTE — Progress Notes (Signed)
Patient ID: [name], female   DOB: 05-23-1924, 81 y.o.   MRN: [id number] PCP: [name]  HPI: Ms. Origer Margarita Grizzle) is an 81 y/o woman with CAD, AAA, systolic HF, mild dementia, afib and CAD. She has had two previous coronary interventions including a cutting balloon to her D2 in 2007 and a DES to her mid LCX in 2010. Her LV dysfunction has been out of proportion to her CAD.  Last echo in 7/15 showed EF 20-25% with severe LV dilation, mild MR, and PA systolic pressure 38 mmHg.   Admitted 7/11-7/24/15 with syncope and dyspnea. She had new onset A-fib/RVR. Troponin was 1.03> 1.59> 2.18 . She was placed on amiodarone and heparin drip. She developed respiratory distress and required intubation. She converted to NSR but later was bradycardiac with NSVT. Amiodarone temporarily stopped but restarted after hyperkalemia resolved. Hyperkalemia was thought to be contributing to bradycardia. Diuresed with IV lasix. Co-ox 29% and started on milrinone which we tried to wean off but she did not tolerate. Discharge weight 87 lbs.   She returns for follow up with her brother. She is stable on home milrinone. Her husband has dismissed her home care attendant against our advice and is now dispensing her medicines. Her brother looks over the medicines and feels like she may not be getting all of it as occasionally some medicines left in her box. Says over the past 2 weeks it has been better.   She walks with a cane and has no dyspnea walking on flat ground.  No orthopnea or PND.  No chest pain.  Weight is stable.   Was seen in ER on 03/03/15 for nausea which has resolved, K was 3.1 at the time.   Labs: (05/23/14) K+ 4.3, creatinine 1.06, digoxin 1.1 (8/15) K 4.7, creatinine 0.94, HCT 33.2 (9/15) K 3.4, creatinine .80 (10/1/0/2015) K 5.0 Creatinine 0.87 Magnesium 2.4  08/11/14 K 5.3 Creatinine 0.8 08/25/14 K 4.4 Cr 0.85 09/04/14   K 4.0 Cr 0.8 pBNP 1763 11/02/14  K 4.2 Cr 0.85 3/16 HCT 32.6, K 4.2, creatinine  0.82 03/03/15  K 3.1 cretinine 0.99 BNP 249  ECG: NSR, poor RWP, LVH with repolarization abnormality  ROS: All systems negative except as listed in HPI, PMH and Problem List.  SH:  History   Social History  . Marital Status: Married    Spouse Name: N/A  . Number of Children: 1  . Years of Education: N/A   Occupational History  . retired    Social History Main Topics  . Smoking status: Never Smoker   . Smokeless tobacco: Never Used  . Alcohol Use: Yes     Comment: occasionally  . Drug Use: No  . Sexual Activity: Not on file   Other Topics Concern  . Not on file   Social History Narrative   She lives in [place], family helps with her care.    FH:  Family History  Problem Relation Age of Onset  . Heart disease Mother   . Heart disease Father   . Heart disease Brother     x 3    Past Medical History  Diagnosis Date  . PVD (peripheral vascular disease)   . Ventricular dysfunction     left; ischemic  . Hyperlipidemia   . AAA (abdominal aortic aneurysm)   . Renal artery stenosis   . Hypertension   . Coronary artery disease 2007    moderate ASCAD of the left system s/p PCI of the D2 and  PCI of the left circ 03/2009  . Chronic systolic heart failure     a. ECHO (04/2014) EF 20-25%, diff HK, mild MR  . Atrial fibrillation     Current Outpatient Prescriptions  Medication Sig Dispense Refill  . acetaminophen (TYLENOL) 500 MG tablet Take 1 tablet (500 mg total) by mouth every 6 (six) hours as needed (for pain). 30 tablet 1  . amiodarone (PACERONE) 200 MG tablet Take 100 mg by mouth daily.    Marland Kitchen apixaban (ELIQUIS) 2.5 MG TABS tablet Take 1 tablet (2.5 mg total) by mouth 2 (two) times daily. 60 tablet 6  . lisinopril (ZESTRIL) 2.5 MG tablet Take 1 tablet (2.5 mg total) by mouth daily. 90 tablet 3  . milrinone (PRIMACOR) 20 MG/100ML SOLN infusion Inject 4.95 mcg/min into the vein continuous. Per [facility] 100 mL 3  . Multiple Vitamin (MULTIVITAMIN) tablet Take  1 tablet by mouth daily. 30 tablet 1  . nitroGLYCERIN (NITROSTAT) 0.4 MG SL tablet Place 1 tablet (0.4 mg total) under the tongue every 5 (five) minutes as needed for chest pain. 15 tablet 1  . Omega-3 Fatty Acids (FISH OIL) 1200 MG CAPS Take 1 capsule (1,200 mg total) by mouth daily. 30 capsule 1  . ondansetron (ZOFRAN ODT) 4 MG disintegrating tablet Take 1 tablet (4 mg total) by mouth every 8 (eight) hours as needed for nausea or vomiting. 20 tablet 0  . potassium chloride SA (KLOR-CON M20) 20 MEQ tablet Take 1 tablet (20 mEq total) by mouth daily. 90 tablet 3  . torsemide (DEMADEX) 20 MG tablet TAKE TWO TABLETS BY MOUTH TWICE DAILY 120 tablet 3   No current facility-administered medications for this encounter.    Filed Vitals:   03/20/15 1503  BP: 128/68  Pulse: 74  Resp: 18  Weight: 90 lb 8 oz (41.051 kg)  SpO2: 94%    PHYSICAL EXAM:  General:  Elderly appearing. Frail, No resp difficulty; brother present. Ambulated in the clinic with a walker.  HEENT: normal Neck: supple. JVP 6-7. Carotids 2+ bilaterally; no bruits. No lymphadenopathy or thryomegaly appreciated. Cor: PMI normal. Regular rate & rhythm. No rubs, gallops.  2/6 SEM apex Lungs: clear Abdomen: soft, nontender, nondistended. No hepatosplenomegaly. No bruits or masses. Good bowel sounds. Extremities: no cyanosis, clubbing, rash, edema LUE PICC Neuro: alert & orientedx3, cranial nerves grossly intact. Moves all 4 extremities w/o difficulty. Affect pleasant.   ASSESSMENT & PLAN:   1) Chronic Systolic Heart Failure: Mixed ischemic/nonischemic cardiomyopathy (degree of LV dysfunction is out of proportion to CAD), EF 25-30% (04/2014 echo).  Doing well, stable NYHA II-III symptoms and volume status stable on milrinone. - Continue current milrinone gtt. - Continue torsemide 40 mg bid.  - Continue lisinopril 2.5 mg daily.  - Recent potassium was low. Will recheck. Can supplement more as needed - Not on beta blocker at this  time with low output.  - Return to clinic in 6 weeks with echo  2) Atrial fibrillation: Paroxysmal.  Now in NSR on amiodarone.  Continue apixaban 2.5 mg bid (weight less than 60 kg and age >79). Continue amiodarone 100 mg daily.   Needs yearly eye exams.  3) Limited Code: No CPR or defibrillation 4) CAD: No chest pain. Off ASA with Eliquis.   Glori Bickers MD  03/20/2015

## 2015-03-20 NOTE — Addendum Note (Signed)
Encounter addended by: Effie Berkshire, RN on: 03/20/2015  3:42 PM<BR>     Documentation filed: Patient Instructions Section

## 2015-03-22 DIAGNOSIS — F0390 Unspecified dementia without behavioral disturbance: Secondary | ICD-10-CM | POA: Diagnosis not present

## 2015-03-22 DIAGNOSIS — I714 Abdominal aortic aneurysm, without rupture: Secondary | ICD-10-CM | POA: Diagnosis not present

## 2015-03-22 DIAGNOSIS — Z452 Encounter for adjustment and management of vascular access device: Secondary | ICD-10-CM | POA: Diagnosis not present

## 2015-03-22 DIAGNOSIS — I2510 Atherosclerotic heart disease of native coronary artery without angina pectoris: Secondary | ICD-10-CM | POA: Diagnosis not present

## 2015-03-22 DIAGNOSIS — I252 Old myocardial infarction: Secondary | ICD-10-CM | POA: Diagnosis not present

## 2015-03-22 DIAGNOSIS — I5043 Acute on chronic combined systolic (congestive) and diastolic (congestive) heart failure: Secondary | ICD-10-CM | POA: Diagnosis not present

## 2015-03-22 DIAGNOSIS — I509 Heart failure, unspecified: Secondary | ICD-10-CM | POA: Diagnosis not present

## 2015-03-24 ENCOUNTER — Encounter: Payer: Self-pay | Admitting: Internal Medicine

## 2015-03-29 ENCOUNTER — Encounter: Payer: Self-pay | Admitting: Internal Medicine

## 2015-03-29 DIAGNOSIS — I5043 Acute on chronic combined systolic (congestive) and diastolic (congestive) heart failure: Secondary | ICD-10-CM | POA: Diagnosis not present

## 2015-03-29 DIAGNOSIS — I509 Heart failure, unspecified: Secondary | ICD-10-CM | POA: Diagnosis not present

## 2015-03-29 DIAGNOSIS — Z452 Encounter for adjustment and management of vascular access device: Secondary | ICD-10-CM | POA: Diagnosis not present

## 2015-03-29 DIAGNOSIS — I252 Old myocardial infarction: Secondary | ICD-10-CM | POA: Diagnosis not present

## 2015-03-29 DIAGNOSIS — I2510 Atherosclerotic heart disease of native coronary artery without angina pectoris: Secondary | ICD-10-CM | POA: Diagnosis not present

## 2015-03-29 DIAGNOSIS — F0390 Unspecified dementia without behavioral disturbance: Secondary | ICD-10-CM | POA: Diagnosis not present

## 2015-03-29 DIAGNOSIS — I714 Abdominal aortic aneurysm, without rupture: Secondary | ICD-10-CM | POA: Diagnosis not present

## 2015-04-03 ENCOUNTER — Encounter: Payer: Self-pay | Admitting: Internal Medicine

## 2015-04-04 ENCOUNTER — Encounter: Payer: Self-pay | Admitting: Internal Medicine

## 2015-04-05 ENCOUNTER — Emergency Department (HOSPITAL_COMMUNITY)
Admission: EM | Admit: 2015-04-05 | Discharge: 2015-04-05 | Disposition: A | Discharge status: Home / Self Care | Payer: Medicare Other | Attending: Emergency Medicine | Admitting: Emergency Medicine

## 2015-04-05 ENCOUNTER — Encounter (HOSPITAL_COMMUNITY): Payer: Self-pay | Admitting: *Deleted

## 2015-04-05 DIAGNOSIS — Z8639 Personal history of other endocrine, nutritional and metabolic disease: Secondary | ICD-10-CM | POA: Diagnosis not present

## 2015-04-05 DIAGNOSIS — I2510 Atherosclerotic heart disease of native coronary artery without angina pectoris: Secondary | ICD-10-CM | POA: Insufficient documentation

## 2015-04-05 DIAGNOSIS — Z79899 Other long term (current) drug therapy: Secondary | ICD-10-CM | POA: Diagnosis not present

## 2015-04-05 DIAGNOSIS — Z418 Encounter for other procedures for purposes other than remedying health state: Secondary | ICD-10-CM | POA: Insufficient documentation

## 2015-04-05 DIAGNOSIS — I10 Essential (primary) hypertension: Secondary | ICD-10-CM | POA: Diagnosis not present

## 2015-04-05 DIAGNOSIS — I5022 Chronic systolic (congestive) heart failure: Secondary | ICD-10-CM | POA: Diagnosis not present

## 2015-04-05 NOTE — ED Provider Notes (Signed)
CSN: [id number]     Arrival date & time 04/05/15  2107 History   First MD Initiated Contact with Patient 04/05/15 2124     Chief Complaint  Patient presents with  . IV complication       HPI Patient presents to the emergency department with complaints that her IV infusion bag is beeping.  In review of the bag it appears as though the reservoir for her milrinone is empty.  Family reports that home health was supposed to come today to change it out but they were unable to as there was a staffing issue with her regular home health nurse.  Patient is on milrinone for history of congestive heart failure.  She is followed by the heart failure clinic.   Past Medical History  Diagnosis Date  . PVD (peripheral vascular disease)   . Ventricular dysfunction     left; ischemic  . Hyperlipidemia   . AAA (abdominal aortic aneurysm)   . Renal artery stenosis   . Hypertension   . Coronary artery disease 2007    moderate ASCAD of the left system s/p PCI of the D2 and PCI of the left circ 03/2009  . Chronic systolic heart failure     a. ECHO (04/2014) EF 20-25%, diff HK, mild MR  . Atrial fibrillation    Past Surgical History  Procedure Laterality Date  . Retinal cryopexy      right eye (for retinal detachment)  . Angioplasty    . Coronary stent placement     Family History  Problem Relation Age of Onset  . Heart disease Mother   . Heart disease Father   . Heart disease Brother     x 3   History  Substance Use Topics  . Smoking status: Never Smoker   . Smokeless tobacco: Never Used  . Alcohol Use: Yes     Comment: occasionally   OB History    No data available     Review of Systems  All other systems reviewed and are negative.     Allergies  Codeine and Garlic  Home Medications   Prior to Admission medications   Medication Sig Start Date End Date Taking? Authorizing Provider  acetaminophen (TYLENOL) 500 MG tablet Take 1 tablet (500 mg total) by mouth every 6 (six) hours  as needed (for pain). 07/26/14  Yes Larey Dresser, MD  amiodarone (PACERONE) 200 MG tablet Take 100 mg by mouth daily.   Yes Historical Provider, MD  apixaban (ELIQUIS) 2.5 MG TABS tablet Take 1 tablet (2.5 mg total) by mouth 2 (two) times daily. 07/26/14  Yes Larey Dresser, MD  lisinopril (ZESTRIL) 2.5 MG tablet Take 1 tablet (2.5 mg total) by mouth daily. 11/23/14  Yes Shaune Pascal Bensimhon, MD  milrinone [facility]) 20 MG/100ML SOLN infusion Inject 4.95 mcg/min into the vein continuous. Per [facility] 05/20/14  Yes Rhonda G Barrett, PA-C  Multiple Vitamin (MULTIVITAMIN) tablet Take 1 tablet by mouth daily. 07/26/14  Yes Larey Dresser, MD  nitroGLYCERIN (NITROSTAT) 0.4 MG SL tablet Place 1 tablet (0.4 mg total) under the tongue every 5 (five) minutes as needed for chest pain. 07/26/14  Yes Larey Dresser, MD  Omega-3 Fatty Acids (FISH OIL) 1200 MG CAPS Take 1 capsule (1,200 mg total) by mouth daily. 07/26/14  Yes Larey Dresser, MD  ondansetron (ZOFRAN ODT) 4 MG disintegrating tablet Take 1 tablet (4 mg total) by mouth every 8 (eight) hours as needed for nausea or  vomiting. 03/03/15  Yes Carmin Muskrat, MD  potassium chloride SA (KLOR-CON M20) 20 MEQ tablet Take 1 tablet (20 mEq total) by mouth daily. 07/26/14  Yes Larey Dresser, MD  torsemide (DEMADEX) 20 MG tablet TAKE TWO TABLETS BY MOUTH TWICE DAILY 03/03/15  Yes Shaune Pascal Bensimhon, MD   BP 128/83 mmHg  Pulse 77  Temp(Src) 98.1 F (36.7 C) (Oral)  Resp 18  SpO2 98% Physical Exam  Constitutional: She is oriented to person, place, and time. She appears well-developed and well-nourished.  HENT:  Head: Normocephalic.  Eyes: EOM are normal.  Neck: Normal range of motion.  Pulmonary/Chest: Effort normal and breath sounds normal.  Abdominal: She exhibits no distension.  Musculoskeletal: Normal range of motion.  PICC line in left upper extremity without surrounding signs of infection.  Neurological: She is alert and oriented to person,  place, and time.  Psychiatric: She has a normal mood and affect.  Nursing note and vitals reviewed.   ED Course  Procedures (including critical care time) Labs Review Labs Reviewed - No data to display  Imaging Review No results found.   EKG Interpretation None      MDM   Final diagnoses:  Medication therapy continued    Home health will meet them at their house and start back on milrinone.  No shortness of breath at this time.  Vital signs are normal.    Jola Schmidt, MD 04/06/15 2142

## 2015-04-05 NOTE — ED Notes (Signed)
Pt states that her IV pump began beeping about 45 min ago; pt states that she was advised to come to the ER if it started beeping

## 2015-04-05 NOTE — ED Notes (Signed)
[facility] was called and the home health nurse on call will be meeting the patient and family at home to fix the pump and prime the medication. Family was told about this plan.

## 2015-04-06 DIAGNOSIS — I714 Abdominal aortic aneurysm, without rupture: Secondary | ICD-10-CM | POA: Diagnosis not present

## 2015-04-06 DIAGNOSIS — Z452 Encounter for adjustment and management of vascular access device: Secondary | ICD-10-CM | POA: Diagnosis not present

## 2015-04-06 DIAGNOSIS — I252 Old myocardial infarction: Secondary | ICD-10-CM | POA: Diagnosis not present

## 2015-04-06 DIAGNOSIS — F0390 Unspecified dementia without behavioral disturbance: Secondary | ICD-10-CM | POA: Diagnosis not present

## 2015-04-06 DIAGNOSIS — I2510 Atherosclerotic heart disease of native coronary artery without angina pectoris: Secondary | ICD-10-CM | POA: Diagnosis not present

## 2015-04-06 DIAGNOSIS — I5043 Acute on chronic combined systolic (congestive) and diastolic (congestive) heart failure: Secondary | ICD-10-CM | POA: Diagnosis not present

## 2015-04-10 ENCOUNTER — Encounter: Payer: Self-pay | Admitting: Internal Medicine

## 2015-04-12 ENCOUNTER — Encounter: Payer: Self-pay | Admitting: Internal Medicine

## 2015-04-12 DIAGNOSIS — I509 Heart failure, unspecified: Secondary | ICD-10-CM | POA: Diagnosis not present

## 2015-04-12 DIAGNOSIS — I5043 Acute on chronic combined systolic (congestive) and diastolic (congestive) heart failure: Secondary | ICD-10-CM | POA: Diagnosis not present

## 2015-04-12 DIAGNOSIS — I252 Old myocardial infarction: Secondary | ICD-10-CM | POA: Diagnosis not present

## 2015-04-12 DIAGNOSIS — I714 Abdominal aortic aneurysm, without rupture: Secondary | ICD-10-CM | POA: Diagnosis not present

## 2015-04-12 DIAGNOSIS — F0390 Unspecified dementia without behavioral disturbance: Secondary | ICD-10-CM | POA: Diagnosis not present

## 2015-04-12 DIAGNOSIS — I2510 Atherosclerotic heart disease of native coronary artery without angina pectoris: Secondary | ICD-10-CM | POA: Diagnosis not present

## 2015-04-12 DIAGNOSIS — Z452 Encounter for adjustment and management of vascular access device: Secondary | ICD-10-CM | POA: Diagnosis not present

## 2015-04-19 ENCOUNTER — Telehealth (HOSPITAL_COMMUNITY): Payer: Self-pay | Admitting: *Deleted

## 2015-04-19 DIAGNOSIS — Z452 Encounter for adjustment and management of vascular access device: Secondary | ICD-10-CM | POA: Diagnosis not present

## 2015-04-19 DIAGNOSIS — I252 Old myocardial infarction: Secondary | ICD-10-CM | POA: Diagnosis not present

## 2015-04-19 DIAGNOSIS — I5043 Acute on chronic combined systolic (congestive) and diastolic (congestive) heart failure: Secondary | ICD-10-CM | POA: Diagnosis not present

## 2015-04-19 DIAGNOSIS — I714 Abdominal aortic aneurysm, without rupture: Secondary | ICD-10-CM | POA: Diagnosis not present

## 2015-04-19 DIAGNOSIS — I2510 Atherosclerotic heart disease of native coronary artery without angina pectoris: Secondary | ICD-10-CM | POA: Diagnosis not present

## 2015-04-19 DIAGNOSIS — I509 Heart failure, unspecified: Secondary | ICD-10-CM | POA: Diagnosis not present

## 2015-04-19 DIAGNOSIS — F0390 Unspecified dementia without behavioral disturbance: Secondary | ICD-10-CM | POA: Diagnosis not present

## 2015-04-19 NOTE — Telephone Encounter (Signed)
RN with [facility] called requesting a verbal order to continue nursing (pt on milrinone) for another 60 days.  Gave verbal. Last seen by DB

## 2015-04-21 ENCOUNTER — Encounter: Payer: Self-pay | Admitting: Internal Medicine

## 2015-04-26 DIAGNOSIS — I252 Old myocardial infarction: Secondary | ICD-10-CM | POA: Diagnosis not present

## 2015-04-26 DIAGNOSIS — F0390 Unspecified dementia without behavioral disturbance: Secondary | ICD-10-CM | POA: Diagnosis not present

## 2015-04-26 DIAGNOSIS — I714 Abdominal aortic aneurysm, without rupture: Secondary | ICD-10-CM | POA: Diagnosis not present

## 2015-04-26 DIAGNOSIS — I2510 Atherosclerotic heart disease of native coronary artery without angina pectoris: Secondary | ICD-10-CM | POA: Diagnosis not present

## 2015-04-26 DIAGNOSIS — I5043 Acute on chronic combined systolic (congestive) and diastolic (congestive) heart failure: Secondary | ICD-10-CM | POA: Diagnosis not present

## 2015-04-26 DIAGNOSIS — Z452 Encounter for adjustment and management of vascular access device: Secondary | ICD-10-CM | POA: Diagnosis not present

## 2015-04-27 DIAGNOSIS — I714 Abdominal aortic aneurysm, without rupture: Secondary | ICD-10-CM | POA: Diagnosis not present

## 2015-04-27 DIAGNOSIS — I4891 Unspecified atrial fibrillation: Secondary | ICD-10-CM | POA: Diagnosis not present

## 2015-04-27 DIAGNOSIS — Z5181 Encounter for therapeutic drug level monitoring: Secondary | ICD-10-CM | POA: Diagnosis not present

## 2015-04-27 DIAGNOSIS — Z452 Encounter for adjustment and management of vascular access device: Secondary | ICD-10-CM | POA: Diagnosis not present

## 2015-04-27 DIAGNOSIS — I10 Essential (primary) hypertension: Secondary | ICD-10-CM | POA: Diagnosis not present

## 2015-04-27 DIAGNOSIS — I5043 Acute on chronic combined systolic (congestive) and diastolic (congestive) heart failure: Secondary | ICD-10-CM | POA: Diagnosis not present

## 2015-04-27 DIAGNOSIS — F0390 Unspecified dementia without behavioral disturbance: Secondary | ICD-10-CM | POA: Diagnosis not present

## 2015-04-27 DIAGNOSIS — I2510 Atherosclerotic heart disease of native coronary artery without angina pectoris: Secondary | ICD-10-CM | POA: Diagnosis not present

## 2015-04-27 DIAGNOSIS — I252 Old myocardial infarction: Secondary | ICD-10-CM | POA: Diagnosis not present

## 2015-04-30 ENCOUNTER — Telehealth: Payer: Self-pay | Admitting: Physician Assistant

## 2015-04-30 NOTE — Telephone Encounter (Signed)
Jacqueline Reilly called worried about his wife's milrinone stating no one has been out to change the it.  I called [facility] and spoke to [name].  She will call their in-house pharmacy and then call the patient to let him know what needs to take place.   Jessic Standifer PAC.

## 2015-05-03 DIAGNOSIS — I714 Abdominal aortic aneurysm, without rupture: Secondary | ICD-10-CM | POA: Diagnosis not present

## 2015-05-03 DIAGNOSIS — F0390 Unspecified dementia without behavioral disturbance: Secondary | ICD-10-CM | POA: Diagnosis not present

## 2015-05-03 DIAGNOSIS — I10 Essential (primary) hypertension: Secondary | ICD-10-CM | POA: Diagnosis not present

## 2015-05-03 DIAGNOSIS — I2510 Atherosclerotic heart disease of native coronary artery without angina pectoris: Secondary | ICD-10-CM | POA: Diagnosis not present

## 2015-05-03 DIAGNOSIS — I5043 Acute on chronic combined systolic (congestive) and diastolic (congestive) heart failure: Secondary | ICD-10-CM | POA: Diagnosis not present

## 2015-05-03 DIAGNOSIS — Z452 Encounter for adjustment and management of vascular access device: Secondary | ICD-10-CM | POA: Diagnosis not present

## 2015-05-10 ENCOUNTER — Encounter: Payer: Self-pay | Admitting: Internal Medicine

## 2015-05-10 DIAGNOSIS — F0390 Unspecified dementia without behavioral disturbance: Secondary | ICD-10-CM | POA: Diagnosis not present

## 2015-05-10 DIAGNOSIS — I714 Abdominal aortic aneurysm, without rupture: Secondary | ICD-10-CM | POA: Diagnosis not present

## 2015-05-10 DIAGNOSIS — I5043 Acute on chronic combined systolic (congestive) and diastolic (congestive) heart failure: Secondary | ICD-10-CM | POA: Diagnosis not present

## 2015-05-10 DIAGNOSIS — I2510 Atherosclerotic heart disease of native coronary artery without angina pectoris: Secondary | ICD-10-CM | POA: Diagnosis not present

## 2015-05-10 DIAGNOSIS — Z452 Encounter for adjustment and management of vascular access device: Secondary | ICD-10-CM | POA: Diagnosis not present

## 2015-05-10 DIAGNOSIS — I10 Essential (primary) hypertension: Secondary | ICD-10-CM | POA: Diagnosis not present

## 2015-05-17 DIAGNOSIS — Z452 Encounter for adjustment and management of vascular access device: Secondary | ICD-10-CM | POA: Diagnosis not present

## 2015-05-17 DIAGNOSIS — I714 Abdominal aortic aneurysm, without rupture: Secondary | ICD-10-CM | POA: Diagnosis not present

## 2015-05-17 DIAGNOSIS — F0390 Unspecified dementia without behavioral disturbance: Secondary | ICD-10-CM | POA: Diagnosis not present

## 2015-05-17 DIAGNOSIS — I5043 Acute on chronic combined systolic (congestive) and diastolic (congestive) heart failure: Secondary | ICD-10-CM | POA: Diagnosis not present

## 2015-05-17 DIAGNOSIS — I10 Essential (primary) hypertension: Secondary | ICD-10-CM | POA: Diagnosis not present

## 2015-05-17 DIAGNOSIS — I2510 Atherosclerotic heart disease of native coronary artery without angina pectoris: Secondary | ICD-10-CM | POA: Diagnosis not present

## 2015-05-22 ENCOUNTER — Encounter: Payer: Self-pay | Admitting: Internal Medicine

## 2015-05-24 DIAGNOSIS — I714 Abdominal aortic aneurysm, without rupture: Secondary | ICD-10-CM | POA: Diagnosis not present

## 2015-05-24 DIAGNOSIS — Z452 Encounter for adjustment and management of vascular access device: Secondary | ICD-10-CM | POA: Diagnosis not present

## 2015-05-24 DIAGNOSIS — I5043 Acute on chronic combined systolic (congestive) and diastolic (congestive) heart failure: Secondary | ICD-10-CM | POA: Diagnosis not present

## 2015-05-24 DIAGNOSIS — I10 Essential (primary) hypertension: Secondary | ICD-10-CM | POA: Diagnosis not present

## 2015-05-24 DIAGNOSIS — F0390 Unspecified dementia without behavioral disturbance: Secondary | ICD-10-CM | POA: Diagnosis not present

## 2015-05-24 DIAGNOSIS — I2510 Atherosclerotic heart disease of native coronary artery without angina pectoris: Secondary | ICD-10-CM | POA: Diagnosis not present

## 2015-05-31 DIAGNOSIS — I5043 Acute on chronic combined systolic (congestive) and diastolic (congestive) heart failure: Secondary | ICD-10-CM | POA: Diagnosis not present

## 2015-05-31 DIAGNOSIS — F0390 Unspecified dementia without behavioral disturbance: Secondary | ICD-10-CM | POA: Diagnosis not present

## 2015-05-31 DIAGNOSIS — I10 Essential (primary) hypertension: Secondary | ICD-10-CM | POA: Diagnosis not present

## 2015-05-31 DIAGNOSIS — I2510 Atherosclerotic heart disease of native coronary artery without angina pectoris: Secondary | ICD-10-CM | POA: Diagnosis not present

## 2015-05-31 DIAGNOSIS — Z452 Encounter for adjustment and management of vascular access device: Secondary | ICD-10-CM | POA: Diagnosis not present

## 2015-05-31 DIAGNOSIS — I714 Abdominal aortic aneurysm, without rupture: Secondary | ICD-10-CM | POA: Diagnosis not present

## 2015-06-06 ENCOUNTER — Encounter: Payer: Self-pay | Admitting: Internal Medicine

## 2015-06-07 DIAGNOSIS — F0390 Unspecified dementia without behavioral disturbance: Secondary | ICD-10-CM | POA: Diagnosis not present

## 2015-06-07 DIAGNOSIS — I10 Essential (primary) hypertension: Secondary | ICD-10-CM | POA: Diagnosis not present

## 2015-06-07 DIAGNOSIS — I5043 Acute on chronic combined systolic (congestive) and diastolic (congestive) heart failure: Secondary | ICD-10-CM | POA: Diagnosis not present

## 2015-06-07 DIAGNOSIS — Z452 Encounter for adjustment and management of vascular access device: Secondary | ICD-10-CM | POA: Diagnosis not present

## 2015-06-07 DIAGNOSIS — I2510 Atherosclerotic heart disease of native coronary artery without angina pectoris: Secondary | ICD-10-CM | POA: Diagnosis not present

## 2015-06-07 DIAGNOSIS — I714 Abdominal aortic aneurysm, without rupture: Secondary | ICD-10-CM | POA: Diagnosis not present

## 2015-06-08 DIAGNOSIS — L603 Nail dystrophy: Secondary | ICD-10-CM | POA: Diagnosis not present

## 2015-06-08 DIAGNOSIS — I739 Peripheral vascular disease, unspecified: Secondary | ICD-10-CM | POA: Diagnosis not present

## 2015-06-08 DIAGNOSIS — L84 Corns and callosities: Secondary | ICD-10-CM | POA: Diagnosis not present

## 2015-06-12 ENCOUNTER — Telehealth (HOSPITAL_COMMUNITY): Payer: Self-pay | Admitting: Cardiology

## 2015-06-12 ENCOUNTER — Telehealth (HOSPITAL_COMMUNITY): Payer: Self-pay | Admitting: *Deleted

## 2015-06-12 DIAGNOSIS — F0390 Unspecified dementia without behavioral disturbance: Secondary | ICD-10-CM | POA: Diagnosis not present

## 2015-06-12 DIAGNOSIS — Z452 Encounter for adjustment and management of vascular access device: Secondary | ICD-10-CM | POA: Diagnosis not present

## 2015-06-12 DIAGNOSIS — I2510 Atherosclerotic heart disease of native coronary artery without angina pectoris: Secondary | ICD-10-CM | POA: Diagnosis not present

## 2015-06-12 DIAGNOSIS — I714 Abdominal aortic aneurysm, without rupture: Secondary | ICD-10-CM | POA: Diagnosis not present

## 2015-06-12 DIAGNOSIS — I5043 Acute on chronic combined systolic (congestive) and diastolic (congestive) heart failure: Secondary | ICD-10-CM | POA: Diagnosis not present

## 2015-06-12 DIAGNOSIS — I10 Essential (primary) hypertension: Secondary | ICD-10-CM | POA: Diagnosis not present

## 2015-06-12 NOTE — Telephone Encounter (Signed)
Home health nurse left vm stating pt no longer needs home oxygen but will not allow them to pick up equipment until [place] the d/c order.  She said the order was faxed over here.  The order is in [place] folder waiting for a signature.

## 2015-06-12 NOTE — Telephone Encounter (Signed)
pts spouse York Grice presented to office requesting to reschedule appointment for an earlier appointment Unable to reschedule appointment for anything sooner than what is already scheduled as patient will have an echo same day   Spouse is concerned for wife as she has difficulties breathing while asleep, states patient reports she is choking and cannot breathe  Pt is set up with [facility] weekly visits and last [place] visit vitals were stable Pt did have low grade temp and mildy elevated WBC-however we will recheck at next [facility] visit with labs as patient denied fever, chills, recent illness, and PICC site was within normal limits  Spoke with Felicita Gage RN [facility] [phone] Patient is not scheduled for [facility] visit until 06/14/15 however will have patient scheduled for a "work in/urgent" visit given husband concern

## 2015-06-14 DIAGNOSIS — I10 Essential (primary) hypertension: Secondary | ICD-10-CM | POA: Diagnosis not present

## 2015-06-14 DIAGNOSIS — F0390 Unspecified dementia without behavioral disturbance: Secondary | ICD-10-CM | POA: Diagnosis not present

## 2015-06-14 DIAGNOSIS — I714 Abdominal aortic aneurysm, without rupture: Secondary | ICD-10-CM | POA: Diagnosis not present

## 2015-06-14 DIAGNOSIS — I2510 Atherosclerotic heart disease of native coronary artery without angina pectoris: Secondary | ICD-10-CM | POA: Diagnosis not present

## 2015-06-14 DIAGNOSIS — Z452 Encounter for adjustment and management of vascular access device: Secondary | ICD-10-CM | POA: Diagnosis not present

## 2015-06-14 DIAGNOSIS — I5043 Acute on chronic combined systolic (congestive) and diastolic (congestive) heart failure: Secondary | ICD-10-CM | POA: Diagnosis not present

## 2015-06-16 ENCOUNTER — Other Ambulatory Visit (HOSPITAL_COMMUNITY): Payer: Self-pay | Admitting: Internal Medicine

## 2015-06-20 ENCOUNTER — Ambulatory Visit (HOSPITAL_COMMUNITY)
Admission: RE | Admit: 2015-06-20 | Discharge: 2015-06-20 | Disposition: A | Discharge status: Home / Self Care | Payer: Medicare Other | Source: Ambulatory Visit | Attending: Internal Medicine | Admitting: Internal Medicine

## 2015-06-20 ENCOUNTER — Ambulatory Visit (HOSPITAL_BASED_OUTPATIENT_CLINIC_OR_DEPARTMENT_OTHER)
Admission: RE | Admit: 2015-06-20 | Discharge: 2015-06-20 | Disposition: A | Discharge status: Home / Self Care | Payer: Medicare Other | Source: Ambulatory Visit | Attending: Cardiology | Admitting: Cardiology

## 2015-06-20 VITALS — BP 110/60 | HR 64 | Wt 87.0 lb

## 2015-06-20 DIAGNOSIS — I10 Essential (primary) hypertension: Secondary | ICD-10-CM | POA: Diagnosis not present

## 2015-06-20 DIAGNOSIS — I4891 Unspecified atrial fibrillation: Secondary | ICD-10-CM | POA: Diagnosis not present

## 2015-06-20 DIAGNOSIS — Z9861 Coronary angioplasty status: Secondary | ICD-10-CM | POA: Diagnosis not present

## 2015-06-20 DIAGNOSIS — F0390 Unspecified dementia without behavioral disturbance: Secondary | ICD-10-CM | POA: Insufficient documentation

## 2015-06-20 DIAGNOSIS — I714 Abdominal aortic aneurysm, without rupture: Secondary | ICD-10-CM | POA: Diagnosis not present

## 2015-06-20 DIAGNOSIS — I739 Peripheral vascular disease, unspecified: Secondary | ICD-10-CM | POA: Insufficient documentation

## 2015-06-20 DIAGNOSIS — D649 Anemia, unspecified: Secondary | ICD-10-CM | POA: Insufficient documentation

## 2015-06-20 DIAGNOSIS — I350 Nonrheumatic aortic (valve) stenosis: Secondary | ICD-10-CM | POA: Diagnosis not present

## 2015-06-20 DIAGNOSIS — E785 Hyperlipidemia, unspecified: Secondary | ICD-10-CM | POA: Diagnosis not present

## 2015-06-20 DIAGNOSIS — I2510 Atherosclerotic heart disease of native coronary artery without angina pectoris: Secondary | ICD-10-CM | POA: Diagnosis not present

## 2015-06-20 DIAGNOSIS — I351 Nonrheumatic aortic (valve) insufficiency: Secondary | ICD-10-CM | POA: Insufficient documentation

## 2015-06-20 DIAGNOSIS — Z7902 Long term (current) use of antithrombotics/antiplatelets: Secondary | ICD-10-CM | POA: Insufficient documentation

## 2015-06-20 DIAGNOSIS — Z8249 Family history of ischemic heart disease and other diseases of the circulatory system: Secondary | ICD-10-CM | POA: Diagnosis not present

## 2015-06-20 DIAGNOSIS — Z79899 Other long term (current) drug therapy: Secondary | ICD-10-CM | POA: Diagnosis not present

## 2015-06-20 DIAGNOSIS — I5022 Chronic systolic (congestive) heart failure: Secondary | ICD-10-CM

## 2015-06-20 DIAGNOSIS — I701 Atherosclerosis of renal artery: Secondary | ICD-10-CM | POA: Diagnosis not present

## 2015-06-20 DIAGNOSIS — I429 Cardiomyopathy, unspecified: Secondary | ICD-10-CM | POA: Insufficient documentation

## 2015-06-20 LAB — CBC
HCT: 31.7 % — ABNORMAL LOW (ref 36.0–46.0)
Hemoglobin: 10.3 g/dL — ABNORMAL LOW (ref 12.0–15.0)
MCH: 26.4 pg (ref 26.0–34.0)
MCHC: 32.5 g/dL (ref 30.0–36.0)
MCV: 81.3 fL (ref 78.0–100.0)
Platelets: 267 10*3/uL (ref 150–400)
RBC: 3.9 MIL/uL (ref 3.87–5.11)
RDW: 15.2 % (ref 11.5–15.5)
WBC: 19.4 10*3/uL — ABNORMAL HIGH (ref 4.0–10.5)

## 2015-06-20 NOTE — Patient Instructions (Addendum)
Labs today  Your physician recommends that you schedule a follow-up appointment in: 6 weeks  

## 2015-06-20 NOTE — Progress Notes (Signed)
Echocardiogram 2D Echocardiogram has been performed.  Jacqueline Reilly 06/20/2015, 3:18 PM

## 2015-06-21 ENCOUNTER — Encounter: Payer: Self-pay | Admitting: Internal Medicine

## 2015-06-21 DIAGNOSIS — I10 Essential (primary) hypertension: Secondary | ICD-10-CM | POA: Diagnosis not present

## 2015-06-21 DIAGNOSIS — F0390 Unspecified dementia without behavioral disturbance: Secondary | ICD-10-CM | POA: Diagnosis not present

## 2015-06-21 DIAGNOSIS — I5023 Acute on chronic systolic (congestive) heart failure: Secondary | ICD-10-CM | POA: Insufficient documentation

## 2015-06-21 DIAGNOSIS — Z452 Encounter for adjustment and management of vascular access device: Secondary | ICD-10-CM | POA: Diagnosis not present

## 2015-06-21 DIAGNOSIS — I5043 Acute on chronic combined systolic (congestive) and diastolic (congestive) heart failure: Secondary | ICD-10-CM | POA: Diagnosis not present

## 2015-06-21 DIAGNOSIS — I2510 Atherosclerotic heart disease of native coronary artery without angina pectoris: Secondary | ICD-10-CM | POA: Diagnosis not present

## 2015-06-21 DIAGNOSIS — I714 Abdominal aortic aneurysm, without rupture: Secondary | ICD-10-CM | POA: Diagnosis not present

## 2015-06-21 NOTE — Progress Notes (Signed)
Patient ID: Jacqueline Reilly, female   DOB: November 09, 1923, 81 y.o.   MRN: [id number] PCP: [name]  HPI: Ms. Labrecque Margarita Grizzle) is an 81 y/o woman with CAD, AAA, systolic HF, mild dementia, afib and CAD. She has had two previous coronary interventions including a cutting balloon to her D2 in 2007 and a DES to her mid LCX in 2010. Her LV dysfunction has been out of proportion to her CAD.  Last echo in 7/15 showed EF 20-25% with severe LV dilation, mild MR, and PA systolic pressure 38 mmHg.   Admitted 7/11-7/24/15 with syncope and dyspnea. She had new onset A-fib/RVR. Troponin was 1.03> 1.59> 2.18 . She was placed on amiodarone and heparin drip. She developed respiratory distress and required intubation. She converted to NSR but later was bradycardiac with NSVT. Amiodarone temporarily stopped but restarted after hyperkalemia resolved. Hyperkalemia was thought to be contributing to bradycardia. Diuresed with IV lasix. Co-ox 29% and started on milrinone which we tried to wean off but she did not tolerate. Discharge weight 87 lbs.   She returns today for HF follow up with her brother. She is stable on home milrinone. Her husband has dismissed her home care attendant against our advice and is now dispensing her medicines. Her brother is very concerned that she is not getting her medications properly as her husband with dementia insists on dispensing them.  Her brother does make the pill boxes for the week,though.  She walks with a cane and has no dyspnea walking on flat ground but is short of breath with steps/inclines.  No orthopnea or PND.  No chest pain.  Weight is stable. No melena/BRBPR.   Echo was done today and reviewed.  EF 25% with diffuse hypokinesis, mild AS/mild AI, RV mildly dilated with moderately decreased systolic function.   Labs: (05/23/14) K+ 4.3, creatinine 1.06, digoxin 1.1 (8/15) K 4.7, creatinine 0.94, HCT 33.2 (9/15) K 3.4, creatinine .80 (10/1/0/2015) K 5.0 Creatinine 0.87 Magnesium 2.4   08/11/14 K 5.3 Creatinine 0.8 08/25/14 K 4.4 Cr 0.85 09/04/14   K 4.0 Cr 0.8 pBNP 1763 11/02/14  K 4.2 Cr 0.85 3/16 HCT 32.6, K 4.2, creatinine 0.82 03/03/15  K 3.1 Cr 0.99 BNP 249 8/16 HCT 29.4, K 4.6, creatinine 0.93  ROS: All systems negative except as listed in HPI, PMH and Problem List.  SH:  Social History   Social History  . Marital Status: Married    Spouse Name: N/A  . Number of Children: 1  . Years of Education: N/A   Occupational History  . retired    Social History Main Topics  . Smoking status: Never Smoker   . Smokeless tobacco: Never Used  . Alcohol Use: Yes     Comment: occasionally  . Drug Use: No  . Sexual Activity: Not on file   Other Topics Concern  . Not on file   Social History Narrative   She lives in [place], family helps with her care.    FH:  Family History  Problem Relation Age of Onset  . Heart disease Mother   . Heart disease Father   . Heart disease Brother     x 3    Past Medical History  Diagnosis Date  . PVD (peripheral vascular disease)   . Ventricular dysfunction     left; ischemic  . Hyperlipidemia   . AAA (abdominal aortic aneurysm)   . Renal artery stenosis   . Hypertension   . Coronary artery disease 2007  moderate ASCAD of the left system s/p PCI of the D2 and PCI of the left circ 03/2009  . Chronic systolic heart failure     a. ECHO (04/2014) EF 20-25%, diff HK, mild MR  . Atrial fibrillation     Current Outpatient Prescriptions  Medication Sig Dispense Refill  . acetaminophen (TYLENOL) 500 MG tablet Take 1 tablet (500 mg total) by mouth every 6 (six) hours as needed (for pain). 30 tablet 1  . amiodarone (PACERONE) 200 MG tablet Take 100 mg by mouth daily.    Marland Kitchen apixaban (ELIQUIS) 2.5 MG TABS tablet Take 1 tablet (2.5 mg total) by mouth 2 (two) times daily. 60 tablet 6  . lisinopril (ZESTRIL) 2.5 MG tablet Take 1 tablet (2.5 mg total) by mouth daily. 90 tablet 3  . milrinone (PRIMACOR) 20 MG/100ML SOLN  infusion Inject 4.95 mcg/min into the vein continuous. Per [facility] 100 mL 3  . Multiple Vitamin (MULTIVITAMIN) tablet Take 1 tablet by mouth daily. 30 tablet 1  . Omega-3 Fatty Acids (FISH OIL) 1200 MG CAPS Take 1 capsule (1,200 mg total) by mouth daily. 30 capsule 1  . potassium chloride SA (KLOR-CON M20) 20 MEQ tablet Take 1 tablet (20 mEq total) by mouth daily. 90 tablet 3  . torsemide (DEMADEX) 20 MG tablet TAKE TWO TABLETS BY MOUTH TWICE DAILY 120 tablet 3  . nitroGLYCERIN (NITROSTAT) 0.4 MG SL tablet Place 1 tablet (0.4 mg total) under the tongue every 5 (five) minutes as needed for chest pain. (Patient not taking: Reported on 06/20/2015) 15 tablet 1  . ondansetron (ZOFRAN ODT) 4 MG disintegrating tablet Take 1 tablet (4 mg total) by mouth every 8 (eight) hours as needed for nausea or vomiting. (Patient not taking: Reported on 06/20/2015) 20 tablet 0   No current facility-administered medications for this encounter.    Filed Vitals:   06/20/15 1456  BP: 110/60  Pulse: 64  Weight: 87 lb (39.463 kg)  SpO2: 90%    PHYSICAL EXAM:  General:  Elderly appearing. Frail, No resp difficulty; brother present. Ambulated in the clinic with a walker.  HEENT: normal Neck: supple. JVP 6-7. Carotids 2+ bilaterally; no bruits. No lymphadenopathy or thryomegaly appreciated. Cor: PMI normal. Regular rate & rhythm. No rubs, gallops.  2/6 SEM RUSB with clear S2. Lungs: clear Abdomen: soft, nontender, nondistended. No hepatosplenomegaly. No bruits or masses. Good bowel sounds. Extremities: no cyanosis, clubbing, rash, edema LUE PICC Neuro: alert & orientedx3, cranial nerves grossly intact. Moves all 4 extremities w/o difficulty. Affect pleasant.  ASSESSMENT & PLAN:   1) Chronic Systolic Heart Failure: Mixed ischemic/nonischemic cardiomyopathy (degree of LV dysfunction is out of proportion to CAD), EF 25% with moderate RV systolic dysfunction on echo today.  Stable NYHA II-III symptoms and  volume status stable on milrinone. - Continue current milrinone gtt. - Continue torsemide 40 mg bid.  - Continue lisinopril 2.5 mg daily.  - Not on beta blocker with low output.  - Main issue at this point in terms of her treatment appears to be actually getting the medication.  She has some dementia and lives with her husband who has dementia.  Her husband has dismissed all home care.  Brother fixes pillboxes but says that the meds are still taken incorrectly.  Difficult to come up with a solution for this as home care is not an option and brother says that assisted living is not an option.  2) Atrial fibrillation: Paroxysmal.  Now in NSR on amiodarone.  Continue apixaban 2.5 mg bid (weight less than 60 kg and age >32). Continue amiodarone 100 mg daily.  Needs yearly eye exams, will need to check LFTs/TSH regularly.  3) Limited Code: No CPR or defibrillation 4) CAD: No chest pain. Off ASA with Eliquis.  5) Anemia: Hemoglobin lower on labs about 2 wks ago.  She denies signs of overt bleeding.  She is on Eliquis.  I will get repeat CBC today.   Loralie Champagne  06/21/2015

## 2015-06-26 DIAGNOSIS — Z5181 Encounter for therapeutic drug level monitoring: Secondary | ICD-10-CM | POA: Diagnosis not present

## 2015-06-26 DIAGNOSIS — I10 Essential (primary) hypertension: Secondary | ICD-10-CM | POA: Diagnosis not present

## 2015-06-26 DIAGNOSIS — I252 Old myocardial infarction: Secondary | ICD-10-CM | POA: Diagnosis not present

## 2015-06-26 DIAGNOSIS — I2510 Atherosclerotic heart disease of native coronary artery without angina pectoris: Secondary | ICD-10-CM | POA: Diagnosis not present

## 2015-06-26 DIAGNOSIS — F0390 Unspecified dementia without behavioral disturbance: Secondary | ICD-10-CM | POA: Diagnosis not present

## 2015-06-26 DIAGNOSIS — I5043 Acute on chronic combined systolic (congestive) and diastolic (congestive) heart failure: Secondary | ICD-10-CM | POA: Diagnosis not present

## 2015-06-26 DIAGNOSIS — I714 Abdominal aortic aneurysm, without rupture: Secondary | ICD-10-CM | POA: Diagnosis not present

## 2015-06-26 DIAGNOSIS — I4891 Unspecified atrial fibrillation: Secondary | ICD-10-CM | POA: Diagnosis not present

## 2015-06-26 DIAGNOSIS — Z452 Encounter for adjustment and management of vascular access device: Secondary | ICD-10-CM | POA: Diagnosis not present

## 2015-06-27 ENCOUNTER — Telehealth (HOSPITAL_COMMUNITY): Payer: Self-pay | Admitting: *Deleted

## 2015-06-27 NOTE — Telephone Encounter (Addendum)
Home Health called about O2 discharging from home. Spoke with [facility] about d/c O2 and she said that pt did need it as PRN for SOB.  HHRN said she would speak with her manager about what she needs to do.

## 2015-06-28 DIAGNOSIS — I714 Abdominal aortic aneurysm, without rupture: Secondary | ICD-10-CM | POA: Diagnosis not present

## 2015-06-28 DIAGNOSIS — I5043 Acute on chronic combined systolic (congestive) and diastolic (congestive) heart failure: Secondary | ICD-10-CM | POA: Diagnosis not present

## 2015-06-28 DIAGNOSIS — I10 Essential (primary) hypertension: Secondary | ICD-10-CM | POA: Diagnosis not present

## 2015-06-28 DIAGNOSIS — I2510 Atherosclerotic heart disease of native coronary artery without angina pectoris: Secondary | ICD-10-CM | POA: Diagnosis not present

## 2015-06-28 DIAGNOSIS — F0390 Unspecified dementia without behavioral disturbance: Secondary | ICD-10-CM | POA: Diagnosis not present

## 2015-06-28 DIAGNOSIS — Z452 Encounter for adjustment and management of vascular access device: Secondary | ICD-10-CM | POA: Diagnosis not present

## 2015-06-30 ENCOUNTER — Other Ambulatory Visit (HOSPITAL_COMMUNITY): Payer: Self-pay | Admitting: Internal Medicine

## 2015-07-05 DIAGNOSIS — I714 Abdominal aortic aneurysm, without rupture: Secondary | ICD-10-CM | POA: Diagnosis not present

## 2015-07-05 DIAGNOSIS — I2510 Atherosclerotic heart disease of native coronary artery without angina pectoris: Secondary | ICD-10-CM | POA: Diagnosis not present

## 2015-07-05 DIAGNOSIS — F0390 Unspecified dementia without behavioral disturbance: Secondary | ICD-10-CM | POA: Diagnosis not present

## 2015-07-05 DIAGNOSIS — Z452 Encounter for adjustment and management of vascular access device: Secondary | ICD-10-CM | POA: Diagnosis not present

## 2015-07-05 DIAGNOSIS — I5043 Acute on chronic combined systolic (congestive) and diastolic (congestive) heart failure: Secondary | ICD-10-CM | POA: Diagnosis not present

## 2015-07-05 DIAGNOSIS — I10 Essential (primary) hypertension: Secondary | ICD-10-CM | POA: Diagnosis not present

## 2015-07-07 ENCOUNTER — Other Ambulatory Visit (HOSPITAL_COMMUNITY): Payer: Self-pay | Admitting: Internal Medicine

## 2015-07-07 ENCOUNTER — Other Ambulatory Visit (HOSPITAL_COMMUNITY): Payer: Self-pay | Admitting: Anesthesiology

## 2015-07-10 ENCOUNTER — Other Ambulatory Visit (HOSPITAL_COMMUNITY): Payer: Self-pay | Admitting: *Deleted

## 2015-07-10 ENCOUNTER — Other Ambulatory Visit (HOSPITAL_COMMUNITY): Payer: Self-pay | Admitting: Cardiology

## 2015-07-10 DIAGNOSIS — I5022 Chronic systolic (congestive) heart failure: Secondary | ICD-10-CM

## 2015-07-10 MED ORDER — POTASSIUM CHLORIDE CRYS ER 20 MEQ PO TBCR: 20.0000 meq | EXTENDED_RELEASE_TABLET | Freq: Every day | ORAL | Status: DC

## 2015-07-12 ENCOUNTER — Telehealth (HOSPITAL_COMMUNITY): Payer: Self-pay | Admitting: *Deleted

## 2015-07-12 DIAGNOSIS — F0390 Unspecified dementia without behavioral disturbance: Secondary | ICD-10-CM | POA: Diagnosis not present

## 2015-07-12 DIAGNOSIS — I5043 Acute on chronic combined systolic (congestive) and diastolic (congestive) heart failure: Secondary | ICD-10-CM | POA: Diagnosis not present

## 2015-07-12 DIAGNOSIS — Z452 Encounter for adjustment and management of vascular access device: Secondary | ICD-10-CM | POA: Diagnosis not present

## 2015-07-12 DIAGNOSIS — I2510 Atherosclerotic heart disease of native coronary artery without angina pectoris: Secondary | ICD-10-CM | POA: Diagnosis not present

## 2015-07-12 DIAGNOSIS — I714 Abdominal aortic aneurysm, without rupture: Secondary | ICD-10-CM | POA: Diagnosis not present

## 2015-07-12 DIAGNOSIS — R0602 Shortness of breath: Secondary | ICD-10-CM | POA: Diagnosis not present

## 2015-07-12 DIAGNOSIS — I10 Essential (primary) hypertension: Secondary | ICD-10-CM | POA: Diagnosis not present

## 2015-07-12 NOTE — Telephone Encounter (Signed)
HHRN called to let us know that Jacqueline Reilly is having some chest discomfort, SOB, and a productive cough at night.  Pt states no weight gain and no edema in ankles.  Per [facility] RN she says that the pts lungs sound clear.   [place] RN called to get an order for a portable CXR and for a home health aide to come out and help with daily activities. [facility] said that it would be ok for this order.

## 2015-07-14 ENCOUNTER — Other Ambulatory Visit (HOSPITAL_COMMUNITY): Payer: Self-pay | Admitting: Internal Medicine

## 2015-07-14 ENCOUNTER — Encounter (HOSPITAL_COMMUNITY): Payer: Self-pay | Admitting: Internal Medicine

## 2015-07-19 DIAGNOSIS — I10 Essential (primary) hypertension: Secondary | ICD-10-CM | POA: Diagnosis not present

## 2015-07-19 DIAGNOSIS — F0390 Unspecified dementia without behavioral disturbance: Secondary | ICD-10-CM | POA: Diagnosis not present

## 2015-07-19 DIAGNOSIS — I714 Abdominal aortic aneurysm, without rupture: Secondary | ICD-10-CM | POA: Diagnosis not present

## 2015-07-19 DIAGNOSIS — I5043 Acute on chronic combined systolic (congestive) and diastolic (congestive) heart failure: Secondary | ICD-10-CM | POA: Diagnosis not present

## 2015-07-19 DIAGNOSIS — I2510 Atherosclerotic heart disease of native coronary artery without angina pectoris: Secondary | ICD-10-CM | POA: Diagnosis not present

## 2015-07-19 DIAGNOSIS — Z452 Encounter for adjustment and management of vascular access device: Secondary | ICD-10-CM | POA: Diagnosis not present

## 2015-07-20 ENCOUNTER — Other Ambulatory Visit: Payer: Self-pay | Admitting: Internal Medicine

## 2015-07-21 ENCOUNTER — Other Ambulatory Visit (HOSPITAL_COMMUNITY): Payer: Self-pay | Admitting: Internal Medicine

## 2015-07-26 DIAGNOSIS — I2510 Atherosclerotic heart disease of native coronary artery without angina pectoris: Secondary | ICD-10-CM | POA: Diagnosis not present

## 2015-07-26 DIAGNOSIS — Z452 Encounter for adjustment and management of vascular access device: Secondary | ICD-10-CM | POA: Diagnosis not present

## 2015-07-26 DIAGNOSIS — I5043 Acute on chronic combined systolic (congestive) and diastolic (congestive) heart failure: Secondary | ICD-10-CM | POA: Diagnosis not present

## 2015-07-26 DIAGNOSIS — I10 Essential (primary) hypertension: Secondary | ICD-10-CM | POA: Diagnosis not present

## 2015-07-26 DIAGNOSIS — I714 Abdominal aortic aneurysm, without rupture: Secondary | ICD-10-CM | POA: Diagnosis not present

## 2015-07-26 DIAGNOSIS — F0390 Unspecified dementia without behavioral disturbance: Secondary | ICD-10-CM | POA: Diagnosis not present

## 2015-07-27 ENCOUNTER — Ambulatory Visit (HOSPITAL_COMMUNITY)
Admission: RE | Admit: 2015-07-27 | Discharge: 2015-07-27 | Disposition: A | Discharge status: Home / Self Care | Payer: Medicare Other | Source: Ambulatory Visit | Attending: Internal Medicine | Admitting: Internal Medicine

## 2015-07-27 VITALS — BP 104/62 | HR 82 | Wt 88.0 lb

## 2015-07-27 DIAGNOSIS — Z955 Presence of coronary angioplasty implant and graft: Secondary | ICD-10-CM | POA: Insufficient documentation

## 2015-07-27 DIAGNOSIS — I739 Peripheral vascular disease, unspecified: Secondary | ICD-10-CM | POA: Insufficient documentation

## 2015-07-27 DIAGNOSIS — Z7902 Long term (current) use of antithrombotics/antiplatelets: Secondary | ICD-10-CM | POA: Diagnosis not present

## 2015-07-27 DIAGNOSIS — D649 Anemia, unspecified: Secondary | ICD-10-CM | POA: Diagnosis not present

## 2015-07-27 DIAGNOSIS — Z8249 Family history of ischemic heart disease and other diseases of the circulatory system: Secondary | ICD-10-CM | POA: Insufficient documentation

## 2015-07-27 DIAGNOSIS — I5022 Chronic systolic (congestive) heart failure: Secondary | ICD-10-CM

## 2015-07-27 DIAGNOSIS — I480 Paroxysmal atrial fibrillation: Secondary | ICD-10-CM

## 2015-07-27 DIAGNOSIS — I714 Abdominal aortic aneurysm, without rupture: Secondary | ICD-10-CM | POA: Diagnosis not present

## 2015-07-27 DIAGNOSIS — F0390 Unspecified dementia without behavioral disturbance: Secondary | ICD-10-CM | POA: Diagnosis not present

## 2015-07-27 DIAGNOSIS — Z79899 Other long term (current) drug therapy: Secondary | ICD-10-CM | POA: Insufficient documentation

## 2015-07-27 DIAGNOSIS — E785 Hyperlipidemia, unspecified: Secondary | ICD-10-CM | POA: Diagnosis not present

## 2015-07-27 DIAGNOSIS — I428 Other cardiomyopathies: Secondary | ICD-10-CM | POA: Insufficient documentation

## 2015-07-27 DIAGNOSIS — I2510 Atherosclerotic heart disease of native coronary artery without angina pectoris: Secondary | ICD-10-CM | POA: Diagnosis not present

## 2015-07-27 DIAGNOSIS — I10 Essential (primary) hypertension: Secondary | ICD-10-CM | POA: Insufficient documentation

## 2015-07-27 NOTE — Patient Instructions (Signed)
We will contact you in 3 months to schedule your next appointment.  

## 2015-07-27 NOTE — Progress Notes (Signed)
ADVANCED HF CLINIC NOTE  Patient ID: Jacqueline Reilly, female   DOB: 28-Mar-1924, 81 y.o.   MRN: [id number] PCP: [name] CHF: Bensimhon  HPI: [name] is an 81 y/o woman with CAD, AAA, systolic HF, mild dementia, afib and CAD. She has had two previous coronary interventions including a cutting balloon to her D2 in 2007 and a DES to her mid LCX in 2010. Her LV dysfunction has been out of proportion to her CAD.  Last echo in 7/15 showed EF 20-25% with severe LV dilation, mild MR, and PA systolic pressure 38 mmHg.   Admitted 7/11-7/24/15 with syncope and dyspnea. She had new onset A-fib/RVR. Troponin was 1.03> 1.59> 2.18 . She was placed on amiodarone and heparin drip. She developed respiratory distress and required intubation. She converted to NSR but later was bradycardiac with NSVT. Amiodarone temporarily stopped but restarted after hyperkalemia resolved. Hyperkalemia was thought to be contributing to bradycardia. Diuresed with IV lasix. Co-ox 29% and started on milrinone which we tried to wean off but she did not tolerate. Discharge weight 87 lbs.   She returns today for HF follow up with her brother. She is stable on home milrinone. Her husband has dismissed her home care attendant against our advice and is now dispensing her medicines.[facility] does come out on Wednesdays to refill milrinone. Her brother is very concerned that she is not getting her medications properly as her husband with dementia insists on dispensing them.  Her brother does make the pill boxes for the week,though. He states that her husband misses giving her doses on occasion.  He also does not weigh her regularly. She walks with a cane and has no dyspnea walking on flat ground but is short of breath with steps/inclines.  No orthopnea or PND.  No chest pain.  Weight is stable.   Echo 8/16  EF 25% with diffuse hypokinesis, mild AS/mild AI, RV mildly dilated with moderately decreased systolic function.   Labs: (05/23/14)  K+ 4.3, creatinine 1.06, digoxin 1.1 (8/15) K 4.7, creatinine 0.94, HCT 33.2 (9/15) K 3.4, creatinine .80 (10/1/0/2015) K 5.0 Creatinine 0.87 Magnesium 2.4  08/11/14 K 5.3 Creatinine 0.8 08/25/14 K 4.4 Cr 0.85 09/04/14   K 4.0 Cr 0.8 pBNP 1763 11/02/14  K 4.2 Cr 0.85 3/16 HCT 32.6, K 4.2, creatinine 0.82 03/03/15  K 3.1 Cr 0.99 BNP 249 8/16 HCT 29.4, K 4.6, creatinine 0.93 07/26/15 Hgb 9.7 K 4.6 creatinine 0.86  ROS: All systems negative except as listed in HPI, PMH and Problem List.  SH:  Social History   Social History  . Marital Status: Married    Spouse Name: N/A  . Number of Children: 1  . Years of Education: N/A   Occupational History  . retired    Social History Main Topics  . Smoking status: Never Smoker   . Smokeless tobacco: Never Used  . Alcohol Use: Yes     Comment: occasionally  . Drug Use: No  . Sexual Activity: Not on file   Other Topics Concern  . Not on file   Social History Narrative   She lives in [place], family helps with her care.    FH:  Family History  Problem Relation Age of Onset  . Heart disease Mother   . Heart disease Father   . Heart disease Brother     x 3    Past Medical History  Diagnosis Date  . PVD (peripheral vascular disease)   . Ventricular dysfunction  left; ischemic  . Hyperlipidemia   . AAA (abdominal aortic aneurysm)   . Renal artery stenosis   . Hypertension   . Coronary artery disease 2007    moderate ASCAD of the left system s/p PCI of the D2 and PCI of the left circ 03/2009  . Chronic systolic heart failure     a. ECHO (04/2014) EF 20-25%, diff HK, mild MR  . Atrial fibrillation     Current Outpatient Prescriptions  Medication Sig Dispense Refill  . amiodarone (PACERONE) 200 MG tablet Take 100 mg by mouth daily.    Marland Kitchen apixaban (ELIQUIS) 2.5 MG TABS tablet Take 1 tablet (2.5 mg total) by mouth 2 (two) times daily. 60 tablet 6  . lisinopril (ZESTRIL) 2.5 MG tablet Take 1 tablet (2.5 mg total) by mouth  daily. 90 tablet 3  . milrinone (PRIMACOR) 20 MG/100ML SOLN infusion Inject 4.95 mcg/min into the vein continuous. Per [facility] 100 mL 3  . Multiple Vitamin (MULTIVITAMIN) tablet Take 1 tablet by mouth daily. 30 tablet 1  . Omega-3 Fatty Acids (FISH OIL) 1200 MG CAPS Take 1 capsule (1,200 mg total) by mouth daily. 30 capsule 1  . potassium chloride SA (KLOR-CON M20) 20 MEQ tablet Take 1 tablet (20 mEq total) by mouth daily. 90 tablet 3  . torsemide (DEMADEX) 20 MG tablet TAKE TWO TABLETS BY MOUTH TWICE DAILY 120 tablet 3  . nitroGLYCERIN (NITROSTAT) 0.4 MG SL tablet Place 1 tablet (0.4 mg total) under the tongue every 5 (five) minutes as needed for chest pain. (Patient not taking: Reported on 06/20/2015) 15 tablet 1   No current facility-administered medications for this encounter.    Filed Vitals:   07/27/15 1456  BP: 104/62  Pulse: 82  Weight: 88 lb (39.917 kg)  SpO2: 87%    PHYSICAL EXAM:  General:  Elderly appearing. Frail, No resp difficulty; brother present. Ambulated in the clinic with a walker.  HEENT: normal Neck: supple. JVP 6-7. Carotids 2+ bilaterally; no bruits. No lymphadenopathy or thryomegaly appreciated. Cor: PMI normal. Regular rate & rhythm. No rubs, gallops.  2/6 SEM RUSB with clear S2. Lungs: clear Abdomen: soft, nontender, nondistended. No hepatosplenomegaly. No bruits or masses. Good bowel sounds. Extremities: no cyanosis, clubbing, rash, edema LUE PICC Neuro: alert & orientedx3, cranial nerves grossly intact. Moves all 4 extremities w/o difficulty. Affect pleasant.  ASSESSMENT & PLAN:   1) Chronic Systolic Heart Failure: Mixed ischemic/nonischemic cardiomyopathy (degree of LV dysfunction is out of proportion to CAD), EF 25% with moderate RV systolic dysfunction on echo today.  Stable NYHA II-III symptoms and volume status stable on milrinone. - Continue current milrinone gtt. - Continue torsemide 40 mg bid.  - Continue lisinopril 2.5 mg daily.  -  Not on beta blocker with low output.  - Overall doing quite well despite the challenges her home situation presents.  2) Atrial fibrillation: Paroxysmal.  Now in NSR on amiodarone.  Continue apixaban 2.5 mg bid (weight less than 60 kg and age >72). Continue amiodarone 100 mg daily.  Needs yearly eye exams, will need to check LFTs/TSH regularly.  3) Limited Code: No CPR or defibrillation 4) CAD: No chest pain. Off ASA with Eliquis.  5) Anemia: Hemoglobin now stable  Glori Bickers MD 07/27/2015

## 2015-07-28 ENCOUNTER — Telehealth: Payer: Self-pay | Admitting: Interventional Cardiology

## 2015-07-28 NOTE — Telephone Encounter (Signed)
ERROR

## 2015-08-02 ENCOUNTER — Telehealth (HOSPITAL_COMMUNITY): Payer: Self-pay

## 2015-08-02 DIAGNOSIS — I5043 Acute on chronic combined systolic (congestive) and diastolic (congestive) heart failure: Secondary | ICD-10-CM | POA: Diagnosis not present

## 2015-08-02 DIAGNOSIS — Z452 Encounter for adjustment and management of vascular access device: Secondary | ICD-10-CM | POA: Diagnosis not present

## 2015-08-02 DIAGNOSIS — I714 Abdominal aortic aneurysm, without rupture: Secondary | ICD-10-CM | POA: Diagnosis not present

## 2015-08-02 DIAGNOSIS — I2510 Atherosclerotic heart disease of native coronary artery without angina pectoris: Secondary | ICD-10-CM | POA: Diagnosis not present

## 2015-08-02 DIAGNOSIS — I10 Essential (primary) hypertension: Secondary | ICD-10-CM | POA: Diagnosis not present

## 2015-08-02 DIAGNOSIS — F0390 Unspecified dementia without behavioral disturbance: Secondary | ICD-10-CM | POA: Diagnosis not present

## 2015-08-02 NOTE — Telephone Encounter (Signed)
Patient said Jacqueline Reilly came by because she has beeping from her life line device.  HHN Jacqueline Reilly said that she was ordering a new machine and it would be there by midnight  Called HHN Jacqueline Reilly at [phone].  Left message to call me back to find out what she is doing with the pump.  It is unsettling to Jacqueline Reilly that the beeping is somewhat continous

## 2015-08-02 NOTE — Telephone Encounter (Signed)
Talked to [facility] and she verified that they were going to be getting a new pump because of it beeping She was out there and trouble shooted the pump at the time.  Has no other complaints

## 2015-08-02 NOTE — Telephone Encounter (Signed)
Patient's husband c/o that she had vomiting and chills.  Nanci Pina, her home health nurse [phone]).  She or another nurse will be going out there to change her pump when it comes in and they will evaluate her chills and vomiting  Informed husband of [facility] visit for both the pump and to evaluate his wife's vomiting and chills

## 2015-08-03 ENCOUNTER — Other Ambulatory Visit (HOSPITAL_COMMUNITY): Payer: Self-pay | Admitting: Internal Medicine

## 2015-08-03 DIAGNOSIS — I2510 Atherosclerotic heart disease of native coronary artery without angina pectoris: Secondary | ICD-10-CM | POA: Diagnosis not present

## 2015-08-03 DIAGNOSIS — I714 Abdominal aortic aneurysm, without rupture: Secondary | ICD-10-CM | POA: Diagnosis not present

## 2015-08-03 DIAGNOSIS — I5043 Acute on chronic combined systolic (congestive) and diastolic (congestive) heart failure: Secondary | ICD-10-CM | POA: Diagnosis not present

## 2015-08-03 DIAGNOSIS — I10 Essential (primary) hypertension: Secondary | ICD-10-CM | POA: Diagnosis not present

## 2015-08-03 DIAGNOSIS — F0390 Unspecified dementia without behavioral disturbance: Secondary | ICD-10-CM | POA: Diagnosis not present

## 2015-08-03 DIAGNOSIS — Z452 Encounter for adjustment and management of vascular access device: Secondary | ICD-10-CM | POA: Diagnosis not present

## 2015-08-08 ENCOUNTER — Other Ambulatory Visit: Payer: Self-pay | Admitting: Cardiology

## 2015-08-09 DIAGNOSIS — Z452 Encounter for adjustment and management of vascular access device: Secondary | ICD-10-CM | POA: Diagnosis not present

## 2015-08-09 DIAGNOSIS — I714 Abdominal aortic aneurysm, without rupture: Secondary | ICD-10-CM | POA: Diagnosis not present

## 2015-08-09 DIAGNOSIS — I5043 Acute on chronic combined systolic (congestive) and diastolic (congestive) heart failure: Secondary | ICD-10-CM | POA: Diagnosis not present

## 2015-08-09 DIAGNOSIS — I10 Essential (primary) hypertension: Secondary | ICD-10-CM | POA: Diagnosis not present

## 2015-08-09 DIAGNOSIS — I2510 Atherosclerotic heart disease of native coronary artery without angina pectoris: Secondary | ICD-10-CM | POA: Diagnosis not present

## 2015-08-09 DIAGNOSIS — F0390 Unspecified dementia without behavioral disturbance: Secondary | ICD-10-CM | POA: Diagnosis not present

## 2015-08-16 DIAGNOSIS — I10 Essential (primary) hypertension: Secondary | ICD-10-CM | POA: Diagnosis not present

## 2015-08-16 DIAGNOSIS — F0390 Unspecified dementia without behavioral disturbance: Secondary | ICD-10-CM | POA: Diagnosis not present

## 2015-08-16 DIAGNOSIS — I5043 Acute on chronic combined systolic (congestive) and diastolic (congestive) heart failure: Secondary | ICD-10-CM | POA: Diagnosis not present

## 2015-08-16 DIAGNOSIS — I714 Abdominal aortic aneurysm, without rupture: Secondary | ICD-10-CM | POA: Diagnosis not present

## 2015-08-16 DIAGNOSIS — I2510 Atherosclerotic heart disease of native coronary artery without angina pectoris: Secondary | ICD-10-CM | POA: Diagnosis not present

## 2015-08-16 DIAGNOSIS — Z452 Encounter for adjustment and management of vascular access device: Secondary | ICD-10-CM | POA: Diagnosis not present

## 2015-08-17 ENCOUNTER — Other Ambulatory Visit (HOSPITAL_COMMUNITY): Payer: Self-pay | Admitting: Internal Medicine

## 2015-08-18 ENCOUNTER — Other Ambulatory Visit (HOSPITAL_COMMUNITY): Payer: Self-pay | Admitting: Internal Medicine

## 2015-08-18 DIAGNOSIS — I2510 Atherosclerotic heart disease of native coronary artery without angina pectoris: Secondary | ICD-10-CM | POA: Diagnosis not present

## 2015-08-18 DIAGNOSIS — I5043 Acute on chronic combined systolic (congestive) and diastolic (congestive) heart failure: Secondary | ICD-10-CM | POA: Diagnosis not present

## 2015-08-18 DIAGNOSIS — Z452 Encounter for adjustment and management of vascular access device: Secondary | ICD-10-CM | POA: Diagnosis not present

## 2015-08-18 DIAGNOSIS — I714 Abdominal aortic aneurysm, without rupture: Secondary | ICD-10-CM | POA: Diagnosis not present

## 2015-08-18 DIAGNOSIS — F0390 Unspecified dementia without behavioral disturbance: Secondary | ICD-10-CM | POA: Diagnosis not present

## 2015-08-18 DIAGNOSIS — I10 Essential (primary) hypertension: Secondary | ICD-10-CM | POA: Diagnosis not present

## 2015-08-23 DIAGNOSIS — F0390 Unspecified dementia without behavioral disturbance: Secondary | ICD-10-CM | POA: Diagnosis not present

## 2015-08-23 DIAGNOSIS — I5043 Acute on chronic combined systolic (congestive) and diastolic (congestive) heart failure: Secondary | ICD-10-CM | POA: Diagnosis not present

## 2015-08-23 DIAGNOSIS — Z452 Encounter for adjustment and management of vascular access device: Secondary | ICD-10-CM | POA: Diagnosis not present

## 2015-08-23 DIAGNOSIS — I10 Essential (primary) hypertension: Secondary | ICD-10-CM | POA: Diagnosis not present

## 2015-08-23 DIAGNOSIS — I2510 Atherosclerotic heart disease of native coronary artery without angina pectoris: Secondary | ICD-10-CM | POA: Diagnosis not present

## 2015-08-23 DIAGNOSIS — I714 Abdominal aortic aneurysm, without rupture: Secondary | ICD-10-CM | POA: Diagnosis not present

## 2015-08-24 ENCOUNTER — Telehealth (HOSPITAL_COMMUNITY): Payer: Self-pay | Admitting: *Deleted

## 2015-08-24 NOTE — Telephone Encounter (Signed)
Received labs from [facility] drawn 10/26:  WBC 15.9 Bun 20 CR 0.93  Per Darrick Grinder, NP get repeat CBC and BC x2, called [facility] and gave VO

## 2015-08-25 DIAGNOSIS — I110 Hypertensive heart disease with heart failure: Secondary | ICD-10-CM | POA: Diagnosis not present

## 2015-08-25 DIAGNOSIS — F0390 Unspecified dementia without behavioral disturbance: Secondary | ICD-10-CM | POA: Diagnosis not present

## 2015-08-25 DIAGNOSIS — Z79899 Other long term (current) drug therapy: Secondary | ICD-10-CM | POA: Diagnosis not present

## 2015-08-25 DIAGNOSIS — I480 Paroxysmal atrial fibrillation: Secondary | ICD-10-CM | POA: Diagnosis not present

## 2015-08-25 DIAGNOSIS — I714 Abdominal aortic aneurysm, without rupture: Secondary | ICD-10-CM | POA: Diagnosis not present

## 2015-08-25 DIAGNOSIS — I5043 Acute on chronic combined systolic (congestive) and diastolic (congestive) heart failure: Secondary | ICD-10-CM | POA: Diagnosis not present

## 2015-08-25 DIAGNOSIS — I5022 Chronic systolic (congestive) heart failure: Secondary | ICD-10-CM | POA: Diagnosis not present

## 2015-08-25 DIAGNOSIS — Z452 Encounter for adjustment and management of vascular access device: Secondary | ICD-10-CM | POA: Diagnosis not present

## 2015-08-25 DIAGNOSIS — Z7901 Long term (current) use of anticoagulants: Secondary | ICD-10-CM | POA: Diagnosis not present

## 2015-08-25 DIAGNOSIS — D649 Anemia, unspecified: Secondary | ICD-10-CM | POA: Diagnosis not present

## 2015-08-25 DIAGNOSIS — I252 Old myocardial infarction: Secondary | ICD-10-CM | POA: Diagnosis not present

## 2015-08-25 DIAGNOSIS — I2510 Atherosclerotic heart disease of native coronary artery without angina pectoris: Secondary | ICD-10-CM | POA: Diagnosis not present

## 2015-08-25 NOTE — Telephone Encounter (Signed)
Repeat CBC done 10/28:  WBC 8.2

## 2015-08-30 DIAGNOSIS — I5022 Chronic systolic (congestive) heart failure: Secondary | ICD-10-CM | POA: Diagnosis not present

## 2015-08-30 DIAGNOSIS — I2510 Atherosclerotic heart disease of native coronary artery without angina pectoris: Secondary | ICD-10-CM | POA: Diagnosis not present

## 2015-08-30 DIAGNOSIS — I480 Paroxysmal atrial fibrillation: Secondary | ICD-10-CM | POA: Diagnosis not present

## 2015-08-30 DIAGNOSIS — I110 Hypertensive heart disease with heart failure: Secondary | ICD-10-CM | POA: Diagnosis not present

## 2015-08-30 DIAGNOSIS — I5043 Acute on chronic combined systolic (congestive) and diastolic (congestive) heart failure: Secondary | ICD-10-CM | POA: Diagnosis not present

## 2015-08-30 DIAGNOSIS — F0390 Unspecified dementia without behavioral disturbance: Secondary | ICD-10-CM | POA: Diagnosis not present

## 2015-08-30 DIAGNOSIS — D649 Anemia, unspecified: Secondary | ICD-10-CM | POA: Diagnosis not present

## 2015-08-31 ENCOUNTER — Emergency Department (HOSPITAL_COMMUNITY): Payer: Medicare Other

## 2015-08-31 ENCOUNTER — Inpatient Hospital Stay (HOSPITAL_COMMUNITY)
Admission: EM | Admit: 2015-08-31 | Discharge: 2015-09-04 | DRG: 315 | Disposition: A | Discharge status: Home Health | Payer: Medicare Other | Attending: Internal Medicine | Admitting: Internal Medicine

## 2015-08-31 ENCOUNTER — Encounter (HOSPITAL_COMMUNITY): Payer: Self-pay | Admitting: Emergency Medicine

## 2015-08-31 ENCOUNTER — Telehealth (HOSPITAL_COMMUNITY): Payer: Self-pay | Admitting: *Deleted

## 2015-08-31 DIAGNOSIS — T80211A Bloodstream infection due to central venous catheter, initial encounter: Principal | ICD-10-CM | POA: Diagnosis present

## 2015-08-31 DIAGNOSIS — F0390 Unspecified dementia without behavioral disturbance: Secondary | ICD-10-CM | POA: Diagnosis present

## 2015-08-31 DIAGNOSIS — Z7901 Long term (current) use of anticoagulants: Secondary | ICD-10-CM

## 2015-08-31 DIAGNOSIS — Z91018 Allergy to other foods: Secondary | ICD-10-CM | POA: Diagnosis not present

## 2015-08-31 DIAGNOSIS — I2510 Atherosclerotic heart disease of native coronary artery without angina pectoris: Secondary | ICD-10-CM | POA: Diagnosis present

## 2015-08-31 DIAGNOSIS — Z955 Presence of coronary angioplasty implant and graft: Secondary | ICD-10-CM | POA: Diagnosis not present

## 2015-08-31 DIAGNOSIS — E785 Hyperlipidemia, unspecified: Secondary | ICD-10-CM | POA: Diagnosis present

## 2015-08-31 DIAGNOSIS — I701 Atherosclerosis of renal artery: Secondary | ICD-10-CM | POA: Diagnosis present

## 2015-08-31 DIAGNOSIS — I739 Peripheral vascular disease, unspecified: Secondary | ICD-10-CM | POA: Diagnosis present

## 2015-08-31 DIAGNOSIS — B9689 Other specified bacterial agents as the cause of diseases classified elsewhere: Secondary | ICD-10-CM | POA: Diagnosis present

## 2015-08-31 DIAGNOSIS — I480 Paroxysmal atrial fibrillation: Secondary | ICD-10-CM | POA: Diagnosis present

## 2015-08-31 DIAGNOSIS — Z8249 Family history of ischemic heart disease and other diseases of the circulatory system: Secondary | ICD-10-CM | POA: Diagnosis not present

## 2015-08-31 DIAGNOSIS — D649 Anemia, unspecified: Secondary | ICD-10-CM | POA: Diagnosis not present

## 2015-08-31 DIAGNOSIS — Y848 Other medical procedures as the cause of abnormal reaction of the patient, or of later complication, without mention of misadventure at the time of the procedure: Secondary | ICD-10-CM | POA: Diagnosis present

## 2015-08-31 DIAGNOSIS — Z885 Allergy status to narcotic agent status: Secondary | ICD-10-CM

## 2015-08-31 DIAGNOSIS — Z79899 Other long term (current) drug therapy: Secondary | ICD-10-CM

## 2015-08-31 DIAGNOSIS — I509 Heart failure, unspecified: Secondary | ICD-10-CM | POA: Insufficient documentation

## 2015-08-31 DIAGNOSIS — I714 Abdominal aortic aneurysm, without rupture: Secondary | ICD-10-CM | POA: Diagnosis present

## 2015-08-31 DIAGNOSIS — I481 Persistent atrial fibrillation: Secondary | ICD-10-CM | POA: Diagnosis not present

## 2015-08-31 DIAGNOSIS — I5023 Acute on chronic systolic (congestive) heart failure: Secondary | ICD-10-CM | POA: Diagnosis present

## 2015-08-31 DIAGNOSIS — I5022 Chronic systolic (congestive) heart failure: Secondary | ICD-10-CM | POA: Diagnosis not present

## 2015-08-31 DIAGNOSIS — R7881 Bacteremia: Secondary | ICD-10-CM | POA: Diagnosis not present

## 2015-08-31 DIAGNOSIS — I4819 Other persistent atrial fibrillation: Secondary | ICD-10-CM | POA: Insufficient documentation

## 2015-08-31 LAB — CBC
HCT: 30.7 % — ABNORMAL LOW (ref 36.0–46.0)
Hemoglobin: 9.8 g/dL — ABNORMAL LOW (ref 12.0–15.0)
MCH: 26.1 pg (ref 26.0–34.0)
MCHC: 31.9 g/dL (ref 30.0–36.0)
MCV: 81.9 fL (ref 78.0–100.0)
Platelets: 296 10*3/uL (ref 150–400)
RBC: 3.75 MIL/uL — ABNORMAL LOW (ref 3.87–5.11)
RDW: 15.8 % — ABNORMAL HIGH (ref 11.5–15.5)
WBC: 13.8 10*3/uL — ABNORMAL HIGH (ref 4.0–10.5)

## 2015-08-31 LAB — COMPREHENSIVE METABOLIC PANEL
ALT: 10 U/L — ABNORMAL LOW (ref 14–54)
AST: 17 U/L (ref 15–41)
Albumin: 3.4 g/dL — ABNORMAL LOW (ref 3.5–5.0)
Alkaline Phosphatase: 84 U/L (ref 38–126)
Anion gap: 12 (ref 5–15)
BUN: 19 mg/dL (ref 6–20)
CO2: 27 mmol/L (ref 22–32)
Calcium: 9.4 mg/dL (ref 8.9–10.3)
Chloride: 97 mmol/L — ABNORMAL LOW (ref 101–111)
Creatinine, Ser: 0.98 mg/dL (ref 0.44–1.00)
GFR calc Af Amer: 58 mL/min — ABNORMAL LOW (ref >60)
GFR calc non Af Amer: 50 mL/min — ABNORMAL LOW (ref >60)
Glucose, Bld: 91 mg/dL (ref 65–99)
Potassium: 4 mmol/L (ref 3.5–5.1)
Sodium: 136 mmol/L (ref 135–145)
Total Bilirubin: 0.3 mg/dL (ref 0.3–1.2)
Total Protein: 7.3 g/dL (ref 6.5–8.1)

## 2015-08-31 LAB — URINALYSIS, ROUTINE W REFLEX MICROSCOPIC
Bilirubin Urine: NEGATIVE
Glucose, UA: NEGATIVE mg/dL
Hgb urine dipstick: NEGATIVE
Ketones, ur: NEGATIVE mg/dL
Leukocytes, UA: NEGATIVE
Nitrite: NEGATIVE
Protein, ur: NEGATIVE mg/dL
Specific Gravity, Urine: 1.011 (ref 1.005–1.030)
Urobilinogen, UA: 0.2 mg/dL (ref 0.0–1.0)
pH: 5 (ref 5.0–8.0)

## 2015-08-31 LAB — I-STAT CG4 LACTIC ACID, ED
Lactic Acid, Venous: 1.43 mmol/L (ref 0.5–2.0)
Lactic Acid, Venous: 0.65 mmol/L (ref 0.5–2.0)

## 2015-08-31 MED ORDER — VANCOMYCIN HCL IN DEXTROSE 750-5 MG/150ML-% IV SOLN
750.0000 mg | Freq: Once | INTRAVENOUS | Status: AC
  Administered 2015-09-01: 750 mg via INTRAVENOUS
  Filled 2015-08-31: qty 150

## 2015-08-31 MED ORDER — SODIUM CHLORIDE 0.9 % IV BOLUS (SEPSIS)
500.0000 mL | Freq: Once | INTRAVENOUS | Status: DC
  Administered 2015-08-31: 250 mL via INTRAVENOUS

## 2015-08-31 MED ORDER — PIPERACILLIN-TAZOBACTAM 3.375 G IVPB 30 MIN
3.3750 g | Freq: Once | INTRAVENOUS | Status: AC
  Administered 2015-08-31: 3.375 g via INTRAVENOUS
  Filled 2015-08-31 (×2): qty 50

## 2015-08-31 NOTE — ED Notes (Signed)
Main lab called to report light green tube for blood work is "contaminated." [place], phlebotomy informed light green top was "contaminated" and that it needs to be re-drawn.

## 2015-08-31 NOTE — ED Notes (Signed)
Dr. Freda Munro at the bedside.

## 2015-08-31 NOTE — ED Notes (Signed)
Pt was sent today after her home health nurse drew blood yesterday and today received results of an elevated WBC and was told to come to the ED. Pt is a heart failure pt with a PICC line receiving a continuous infusion. Pt denies any pain. Pt has no complaints.

## 2015-08-31 NOTE — ED Notes (Signed)
Per EDP, hold antibiotics at this time.

## 2015-08-31 NOTE — ED Notes (Signed)
Freda Munro, MD Res at bedside.

## 2015-08-31 NOTE — Telephone Encounter (Signed)
Received BC back today which show "Gram Negative Rods"  WBC 10.2, per Dr Haroldine Laws pt needs to go to ER to be admitted.  Have attempted to call pt's brother multiple times and have left a couple of VM for him to call us back, have attempted to call pt's son in [place] and have not been able to reach him.  I have also called [facility] and they will also try to reach pt.

## 2015-08-31 NOTE — ED Provider Notes (Signed)
Arrival Date & Time: 08/31/15 & 1800 History   Chief Complaint  Patient presents with  . Blood Infection   HPI Jacqueline Reilly is a 81 y.o. female who presents due to concern per her Cardiology team who received lab results of BCs that resulted and showed 4 out of 4 + BC for gram negative rods and leukocytosis. Patient denies any symptoms at this time. Brother and husband denies change in mental status of the patient. Patient on milranone via PICC line placed per HF team.  Past Medical History  I reviewed & agree with nursing's documentation on PMHx, PSHx, SHx and FHx. Past Medical History  Diagnosis Date  . PVD (peripheral vascular disease) ([place])   . Ventricular dysfunction     left; ischemic  . Hyperlipidemia   . AAA (abdominal aortic aneurysm) ([place])   . Renal artery stenosis ([place])   . Hypertension   . Coronary artery disease 2007    moderate ASCAD of the left system s/p PCI of the D2 and PCI of the left circ 03/2009  . Chronic systolic heart failure ([place])     a. ECHO (04/2014) EF 20-25%, diff HK, mild MR  . Atrial fibrillation [facility])    Past Surgical History  Procedure Laterality Date  . Retinal cryopexy      right eye (for retinal detachment)  . Angioplasty    . Coronary stent placement     Social History   Social History  . Marital Status: Married    Spouse Name: N/A  . Number of Children: 1  . Years of Education: N/A   Occupational History  . retired    Social History Main Topics  . Smoking status: Never Smoker   . Smokeless tobacco: Never Used  . Alcohol Use: Yes     Comment: occasionally  . Drug Use: No  . Sexual Activity: Not Asked   Other Topics Concern  . None   Social History Narrative   She lives in [place], family helps with her care.   Family History  Problem Relation Age of Onset  . Heart disease Mother   . Heart disease Father   . Heart disease Brother     x 3    Review of Systems   Complete ROS performed by me, all positives &  negatives documented as above in HPI. All other ROS negative.  Allergies  Codeine and Garlic  Home Medications   Prior to Admission medications   Medication Sig Start Date End Date Taking? Authorizing Provider  amiodarone (PACERONE) 200 MG tablet Take 100 mg by mouth daily.   Yes Historical Provider, MD  ELIQUIS 2.5 MG TABS tablet TAKE ONE TABLET BY MOUTH TWICE DAILY 08/08/15  Yes Larey Dresser, MD  lisinopril (ZESTRIL) 2.5 MG tablet Take 1 tablet (2.5 mg total) by mouth daily. 11/23/14  Yes Shaune Pascal Bensimhon, MD  milrinone [facility]) 20 MG/100ML SOLN infusion Inject 4.95 mcg/min into the vein continuous. Per [facility] 05/20/14  Yes Rhonda G Barrett, PA-C  Multiple Vitamin (MULTIVITAMIN) tablet Take 1 tablet by mouth daily. 07/26/14  Yes Larey Dresser, MD  Omega-3 Fatty Acids (FISH OIL) 1000 MG CAPS Take 1 capsule by mouth daily.   Yes Historical Provider, MD  potassium chloride SA (KLOR-CON M20) 20 MEQ tablet Take 1 tablet (20 mEq total) by mouth daily. 07/10/15  Yes Jolaine Artist, MD  torsemide (DEMADEX) 20 MG tablet TAKE TWO TABLETS BY MOUTH TWICE DAILY 07/20/15  Yes Shaune Pascal  Bensimhon, MD  nitroGLYCERIN (NITROSTAT) 0.4 MG SL tablet Place 1 tablet (0.4 mg total) under the tongue every 5 (five) minutes as needed for chest pain. Patient not taking: Reported on 06/20/2015 07/26/14   Larey Dresser, MD  Omega-3 Fatty Acids (FISH OIL) 1200 MG CAPS Take 1 capsule (1,200 mg total) by mouth daily. Patient not taking: Reported on 08/31/2015 07/26/14   Larey Dresser, MD    Physical Exam  BP 132/68 mmHg  Pulse 64  Temp(Src) 98.7 F (37.1 C) (Oral)  Resp 20  Ht 4\' 6"  (1.372 m)  Wt 88 lb (39.917 kg)  BMI 21.21 kg/m2  SpO2 97% Physical Exam  Constitutional: She is oriented to person, place, and time. She appears well-developed and well-nourished.  Non-toxic appearance. She does not appear ill. No distress.  HENT:  Head: Normocephalic and atraumatic.  Right Ear: External ear  normal.  Left Ear: External ear normal.  Eyes: Pupils are equal, round, and reactive to light. No scleral icterus.  Neck: Normal range of motion. Neck supple. No tracheal deviation present.  Cardiovascular: Normal heart sounds and intact distal pulses.   No murmur heard. Pulmonary/Chest: Effort normal and breath sounds normal. No stridor. No respiratory distress. She has no wheezes. She has no rales.  Abdominal: Soft. Bowel sounds are normal. She exhibits no distension. There is no tenderness. There is no rebound and no guarding.  Musculoskeletal: Normal range of motion.  Neurological: She is alert and oriented to person, place, and time. She has normal strength and normal reflexes. No cranial nerve deficit or sensory deficit.  Skin: Skin is warm and dry. No pallor.  PICC Line in LUE that shows no induration, erythema or warmth. No localized ttp.  Psychiatric: She has a normal mood and affect. Her behavior is normal.  Nursing note and vitals reviewed.   ED Course  Procedures Labs Review Labs Reviewed  CBC - Abnormal; Notable for the following:    WBC 13.8 (*)    RBC 3.75 (*)    Hemoglobin 9.8 (*)    HCT 30.7 (*)    RDW 15.8 (*)    All other components within normal limits  COMPREHENSIVE METABOLIC PANEL - Abnormal; Notable for the following:    Chloride 97 (*)    Albumin 3.4 (*)    ALT 10 (*)    GFR calc non Af Amer 50 (*)    GFR calc Af Amer 58 (*)    All other components within normal limits  CBC WITH DIFFERENTIAL/PLATELET - Abnormal; Notable for the following:    WBC 11.4 (*)    RBC 3.49 (*)    Hemoglobin 9.2 (*)    HCT 28.8 (*)    RDW 15.7 (*)    Neutro Abs 9.2 (*)    All other components within normal limits  CULTURE, BLOOD (ROUTINE X 2)  CULTURE, BLOOD (ROUTINE X 2)  URINE CULTURE  MRSA PCR SCREENING  URINALYSIS, ROUTINE W REFLEX MICROSCOPIC (NOT AT [facility])  COMPREHENSIVE METABOLIC PANEL  I-STAT CG4 LACTIC ACID, ED  I-STAT CG4 LACTIC ACID, ED  I-STAT CG4 LACTIC  ACID, ED  I-STAT CG4 LACTIC ACID, ED    Imaging Review Dg Chest 2 View  08/31/2015  CLINICAL DATA:  81 year old female with chronic heart failure and leukocytosis EXAM: CHEST  2 VIEW COMPARISON:  Prior chest x-ray 09/04/2014 FINDINGS: Stable cardiomegaly with left heart enlargement. Atherosclerotic calcifications present throughout the transverse aorta. Left upper extremity dual-lumen PICC with the tip well positioned at  the superior cavoatrial junction. Chronic bronchitic change and interstitial prominence. No focal airspace consolidation, pleural effusion or pneumothorax. No evidence of edema. No acute osseous abnormality. IMPRESSION: Stable chest x-ray without evidence of acute cardiopulmonary process. The tip of the left upper extremity PICC is positioned at the superior cavoatrial junction. Electronically Signed   By: Jacqulynn Cadet M.D.   On: 08/31/2015 19:31    Laboratory and Imaging results were personally reviewed by myself and used in the medical decision making of this patient's treatment and disposition.  EKG Interpretation  EKG Interpretation  Date/Time:    Ventricular Rate:    PR Interval:    QRS Duration:   QT Interval:    QTC Calculation:   R Axis:     Text Interpretation:        MDM  Jacqueline Reilly is a 81 y.o. female with H&P as above. ED clinical course as follows: Patient documented to [facility] therefore I obtained supplemental Hx from the nurse who cares for the patient and she confirmed patient's BCs resulted today that showed 4 out of 4 + BC for gram negative rods and leukocytosis. Concern due to possible line infection.  Patient with weak systolics however unclear if due to infection vs HF with EF in 20-25% range or peripheral vasodilation from milranone.   Patient without AMS or signs for poor end-organ perfusion and lactic acid within normal limits, therefore sepsis unlikely at this time. UA and CXR  unremarkable.  Given concern and inability to provide 30 ml/kg IVF in light of significantly reduced EF, IV ABx were initiated early after [facility] and Urine cultures. IV vanz and zosyn given along with 500 cc bolus.  Disposition: Reassessment reveals patient requires admission for futher evaluation and observation of bacteremia, therefore I placed a consult to the Hospitalist service for admission.  We discussed the patient's ED course including their H&P, as well as, their remarkable labs and imaging.  They have received all interventions and treatments requested while in the ED.   Patient admitted for possible emergent etiology, however to the best of my ability, I assured the patient as stable as possible prior to transport.   Clinical Impression:  1. Gram-negative bacteremia ([place])   2. Chronic systolic CHF (congestive heart failure) [facility])    Patient care discussed with Dr. Tomi Bamberger, who oversaw their evaluation & treatment & voiced agreement. House Officer: Voncille Lo, MD, Emergency Medicine.  Voncille Lo, MD 09/01/15 [id number]  Dorie Rank, MD 09/01/15 [phone]

## 2015-08-31 NOTE — Telephone Encounter (Signed)
Spoke w/pt's brother, he is aware and will bring pt to ER tonight

## 2015-08-31 NOTE — ED Notes (Signed)
Called carelink for code sepsis

## 2015-08-31 NOTE — Progress Notes (Signed)
   08/31/15 2334  Clinical Encounter Type  Visited With Family;Patient;Health care provider  Visit Type Initial  Referral From Nurse  [place] responded to a request from a nurse tech to visit with the patient's husband, who is a 81 year old Probation officer, who was pretty emotional about this situation. Chaplain offered support and hospitality. Chaplain support available as needed.   Jeri Lager, Chaplain 08/31/2015 11:36 PM

## 2015-09-01 ENCOUNTER — Encounter (HOSPITAL_COMMUNITY): Payer: Self-pay | Admitting: Internal Medicine

## 2015-09-01 DIAGNOSIS — R7881 Bacteremia: Secondary | ICD-10-CM | POA: Diagnosis present

## 2015-09-01 DIAGNOSIS — I5022 Chronic systolic (congestive) heart failure: Secondary | ICD-10-CM

## 2015-09-01 DIAGNOSIS — D649 Anemia, unspecified: Secondary | ICD-10-CM | POA: Diagnosis present

## 2015-09-01 DIAGNOSIS — I481 Persistent atrial fibrillation: Secondary | ICD-10-CM

## 2015-09-01 DIAGNOSIS — I4819 Other persistent atrial fibrillation: Secondary | ICD-10-CM | POA: Insufficient documentation

## 2015-09-01 LAB — CBC WITH DIFFERENTIAL/PLATELET
Basophils Absolute: 0 10*3/uL (ref 0.0–0.1)
Basophils Relative: 0 %
Eosinophils Absolute: 0.2 10*3/uL (ref 0.0–0.7)
Eosinophils Relative: 1 %
HCT: 28.8 % — ABNORMAL LOW (ref 36.0–46.0)
Hemoglobin: 9.2 g/dL — ABNORMAL LOW (ref 12.0–15.0)
Lymphocytes Relative: 11 %
Lymphs Abs: 1.3 10*3/uL (ref 0.7–4.0)
MCH: 26.4 pg (ref 26.0–34.0)
MCHC: 31.9 g/dL (ref 30.0–36.0)
MCV: 82.5 fL (ref 78.0–100.0)
Monocytes Absolute: 0.8 10*3/uL (ref 0.1–1.0)
Monocytes Relative: 7 %
Neutro Abs: 9.2 10*3/uL — ABNORMAL HIGH (ref 1.7–7.7)
Neutrophils Relative %: 81 %
Platelets: 247 10*3/uL (ref 150–400)
RBC: 3.49 MIL/uL — ABNORMAL LOW (ref 3.87–5.11)
RDW: 15.7 % — ABNORMAL HIGH (ref 11.5–15.5)
WBC: 11.4 10*3/uL — ABNORMAL HIGH (ref 4.0–10.5)

## 2015-09-01 LAB — COMPREHENSIVE METABOLIC PANEL
ALT: 7 U/L — ABNORMAL LOW (ref 14–54)
AST: 15 U/L (ref 15–41)
Albumin: 2.7 g/dL — ABNORMAL LOW (ref 3.5–5.0)
Alkaline Phosphatase: 69 U/L (ref 38–126)
Anion gap: 8 (ref 5–15)
BUN: 16 mg/dL (ref 6–20)
CO2: 28 mmol/L (ref 22–32)
Calcium: 8.7 mg/dL — ABNORMAL LOW (ref 8.9–10.3)
Chloride: 102 mmol/L (ref 101–111)
Creatinine, Ser: 0.9 mg/dL (ref 0.44–1.00)
GFR calc Af Amer: >60 mL/min (ref >60)
GFR calc non Af Amer: 55 mL/min — ABNORMAL LOW (ref >60)
Glucose, Bld: 86 mg/dL (ref 65–99)
Potassium: 3.7 mmol/L (ref 3.5–5.1)
Sodium: 138 mmol/L (ref 135–145)
Total Bilirubin: 0.5 mg/dL (ref 0.3–1.2)
Total Protein: 5.9 g/dL — ABNORMAL LOW (ref 6.5–8.1)

## 2015-09-01 LAB — GLUCOSE, CAPILLARY: Glucose-Capillary: 91 mg/dL (ref 65–99)

## 2015-09-01 LAB — URINALYSIS, ROUTINE W REFLEX MICROSCOPIC
Bilirubin Urine: NEGATIVE
Glucose, UA: NEGATIVE mg/dL
Hgb urine dipstick: NEGATIVE
Ketones, ur: NEGATIVE mg/dL
Leukocytes, UA: NEGATIVE
Nitrite: NEGATIVE
Protein, ur: NEGATIVE mg/dL
Specific Gravity, Urine: 1.012 (ref 1.005–1.030)
Urobilinogen, UA: 0.2 mg/dL (ref 0.0–1.0)
pH: 6 (ref 5.0–8.0)

## 2015-09-01 LAB — I-STAT CG4 LACTIC ACID, ED: Lactic Acid, Venous: 0.91 mmol/L (ref 0.5–2.0)

## 2015-09-01 LAB — MRSA PCR SCREENING: MRSA by PCR: NEGATIVE

## 2015-09-01 MED ORDER — ACETAMINOPHEN 650 MG RE SUPP: 650.0000 mg | Freq: Four times a day (QID) | RECTAL | Status: DC | PRN

## 2015-09-01 MED ORDER — APIXABAN 2.5 MG PO TABS
2.5000 mg | ORAL_TABLET | Freq: Two times a day (BID) | ORAL | Status: DC
  Administered 2015-09-01 – 2015-09-04 (×7): 2.5 mg via ORAL
  Filled 2015-09-01 (×7): qty 1

## 2015-09-01 MED ORDER — ONDANSETRON HCL 4 MG PO TABS: 4.0000 mg | ORAL_TABLET | Freq: Four times a day (QID) | ORAL | Status: DC | PRN

## 2015-09-01 MED ORDER — ADULT MULTIVITAMIN W/MINERALS CH
1.0000 | ORAL_TABLET | Freq: Every day | ORAL | Status: DC
  Administered 2015-09-01 – 2015-09-04 (×4): 1 via ORAL
  Filled 2015-09-01 (×4): qty 1

## 2015-09-01 MED ORDER — MILRINONE IN DEXTROSE 20 MG/100ML IV SOLN
0.1250 ug/kg/min | INTRAVENOUS | Status: DC
  Administered 2015-09-01 – 2015-09-03 (×2): 0.125 ug/kg/min via INTRAVENOUS
  Filled 2015-09-01 (×2): qty 100

## 2015-09-01 MED ORDER — POTASSIUM CHLORIDE CRYS ER 20 MEQ PO TBCR
20.0000 meq | EXTENDED_RELEASE_TABLET | Freq: Every day | ORAL | Status: DC
  Administered 2015-09-01 – 2015-09-03 (×3): 20 meq via ORAL
  Filled 2015-09-01 (×3): qty 1

## 2015-09-01 MED ORDER — GUAIFENESIN 100 MG/5ML PO SOLN
5.0000 mL | ORAL | Status: DC | PRN
  Administered 2015-09-01: 100 mg via ORAL
  Filled 2015-09-01: qty 5

## 2015-09-01 MED ORDER — ONE-DAILY MULTI VITAMINS PO TABS: 1.0000 | ORAL_TABLET | Freq: Every day | ORAL | Status: DC

## 2015-09-01 MED ORDER — PIPERACILLIN-TAZOBACTAM 3.375 G IVPB
3.3750 g | Freq: Three times a day (TID) | INTRAVENOUS | Status: DC
  Administered 2015-09-01 – 2015-09-02 (×3): 3.375 g via INTRAVENOUS
  Filled 2015-09-01 (×4): qty 50

## 2015-09-01 MED ORDER — SODIUM CHLORIDE 0.9 % IJ SOLN
3.0000 mL | Freq: Two times a day (BID) | INTRAMUSCULAR | Status: DC
Start: 2015-09-01 — End: 2015-09-04
  Administered 2015-09-02 – 2015-09-04 (×5): 3 mL via INTRAVENOUS

## 2015-09-01 MED ORDER — OMEGA-3-ACID ETHYL ESTERS 1 G PO CAPS
1.0000 g | ORAL_CAPSULE | Freq: Every day | ORAL | Status: DC
  Administered 2015-09-01 – 2015-09-04 (×4): 1 g via ORAL
  Filled 2015-09-01 (×4): qty 1

## 2015-09-01 MED ORDER — TORSEMIDE 20 MG PO TABS
40.0000 mg | ORAL_TABLET | Freq: Two times a day (BID) | ORAL | Status: DC
  Administered 2015-09-01 – 2015-09-04 (×7): 40 mg via ORAL
  Filled 2015-09-01 (×7): qty 2

## 2015-09-01 MED ORDER — VANCOMYCIN HCL 500 MG IV SOLR: 500.0000 mg | INTRAVENOUS | Status: DC

## 2015-09-01 MED ORDER — OMEGA-3-ACID ETHYL ESTERS 1 G PO CAPS: 1.0000 g | ORAL_CAPSULE | Freq: Two times a day (BID) | ORAL | Status: DC

## 2015-09-01 MED ORDER — LISINOPRIL 2.5 MG PO TABS
2.5000 mg | ORAL_TABLET | Freq: Every day | ORAL | Status: DC
Start: 2015-09-02 — End: 2015-09-04
  Administered 2015-09-02 – 2015-09-04 (×3): 2.5 mg via ORAL
  Filled 2015-09-01 (×3): qty 1

## 2015-09-01 MED ORDER — ONDANSETRON HCL 4 MG/2ML IJ SOLN: 4.0000 mg | Freq: Four times a day (QID) | INTRAMUSCULAR | Status: DC | PRN

## 2015-09-01 MED ORDER — AMIODARONE HCL 100 MG PO TABS
100.0000 mg | ORAL_TABLET | Freq: Every day | ORAL | Status: DC
  Administered 2015-09-01 – 2015-09-04 (×4): 100 mg via ORAL
  Filled 2015-09-01 (×4): qty 1

## 2015-09-01 MED ORDER — MILRINONE IN DEXTROSE 20 MG/100ML IV SOLN: 0.1250 ug/kg/min | INTRAVENOUS | Status: DC

## 2015-09-01 MED ORDER — ACETAMINOPHEN 325 MG PO TABS: 650.0000 mg | ORAL_TABLET | Freq: Four times a day (QID) | ORAL | Status: DC | PRN

## 2015-09-01 MED ORDER — FISH OIL 1000 MG PO CAPS: 1.0000 | ORAL_CAPSULE | Freq: Every day | ORAL | Status: DC

## 2015-09-01 NOTE — ED Notes (Signed)
Pt's BP- 98/50 manually, MD notified.

## 2015-09-01 NOTE — Progress Notes (Signed)
ANTIBIOTIC CONSULT NOTE - INITIAL  Pharmacy Consult for Vancomycin, Zosyn Indication: Bacteremia   Allergies  Allergen Reactions  . Codeine Nausea Only  . Garlic Nausea Only    Patient Measurements: Height: 5' (152.4 cm) Weight: 87 lb (39.463 kg) IBW/kg (Calculated) : 45.5  Vital Signs: Temp: 98.6 F (37 C) (11/03 2301) Temp Source: Rectal (11/03 2301) BP: 104/53 mmHg (11/04 0330) Pulse Rate: 58 (11/04 0330) Intake/Output from previous day:   Intake/Output from this shift:    Labs:  Recent Labs  08/31/15 1842 08/31/15 2041  WBC 13.8*  --   HGB 9.8*  --   PLT 296  --   CREATININE  --  0.98   Estimated Creatinine Clearance: 24.3 mL/min (by C-G formula based on Cr of 0.98). No results for input(s): VANCOTROUGH, VANCOPEAK, VANCORANDOM, GENTTROUGH, GENTPEAK, GENTRANDOM, TOBRATROUGH, TOBRAPEAK, TOBRARND, AMIKACINPEAK, AMIKACINTROU, AMIKACIN in the last 72 hours.   Microbiology: No results found for this or any previous visit (from the past 720 hour(s)).  Medical History: Past Medical History  Diagnosis Date  . PVD (peripheral vascular disease) ([place])   . Ventricular dysfunction     left; ischemic  . Hyperlipidemia   . AAA (abdominal aortic aneurysm) ([place])   . Renal artery stenosis ([place])   . Hypertension   . Coronary artery disease 2007    moderate ASCAD of the left system s/p PCI of the D2 and PCI of the left circ 03/2009  . Chronic systolic heart failure ([place])     a. ECHO (04/2014) EF 20-25%, diff HK, mild MR  . Atrial fibrillation ([facility])     Medications:  Prescriptions prior to admission  Medication Sig Dispense Refill Last Dose  . amiodarone (PACERONE) 200 MG tablet Take 100 mg by mouth daily.   08/31/2015 at Unknown time  . ELIQUIS 2.5 MG TABS tablet TAKE ONE TABLET BY MOUTH TWICE DAILY 60 tablet 3 08/31/2015 at Unknown time  . lisinopril (ZESTRIL) 2.5 MG tablet Take 1 tablet (2.5 mg total) by mouth daily. 90 tablet 3 08/31/2015 at Unknown time  . milrinone  (PRIMACOR) 20 MG/100ML SOLN infusion Inject 4.95 mcg/min into the vein continuous. Per [facility] 100 mL 3 08/31/2015 at Unknown time  . Multiple Vitamin (MULTIVITAMIN) tablet Take 1 tablet by mouth daily. 30 tablet 1 08/31/2015 at Unknown time  . Omega-3 Fatty Acids (FISH OIL) 1000 MG CAPS Take 1 capsule by mouth daily.   08/31/2015 at Unknown time  . potassium chloride SA (KLOR-CON M20) 20 MEQ tablet Take 1 tablet (20 mEq total) by mouth daily. 90 tablet 3 08/31/2015 at Unknown time  . torsemide (DEMADEX) 20 MG tablet TAKE TWO TABLETS BY MOUTH TWICE DAILY 120 tablet 3 08/31/2015 at Unknown time  . nitroGLYCERIN (NITROSTAT) 0.4 MG SL tablet Place 1 tablet (0.4 mg total) under the tongue every 5 (five) minutes as needed for chest pain. (Patient not taking: Reported on 06/20/2015) 15 tablet 1 Not Taking at Unknown time  . Omega-3 Fatty Acids (FISH OIL) 1200 MG CAPS Take 1 capsule (1,200 mg total) by mouth daily. (Patient not taking: Reported on 08/31/2015) 30 capsule 1 Not Taking at Unknown time   Assessment: Jacqueline Reilly who presented to the ED for 4/4 BCx positive for gram negative rods and leukocytosis. Pharmacy consulted to start IV Vancomycin and Zosyn for empiric coverage of bacteremia. WBC elevated at 13.8 on admission. LA 1.43 has not trended down to 0.91. Afebrile.   Goal of Therapy:  Vancomycin trough level 15-20 mcg/ml  Plan:  -Vancomycin 500 mg IV Q 24 hours -Zosyn 3.375 gm IV Q 8 hours  -Monitor CBC, renal fx, cultures and clinical progress  -VT at [place], PharmD., BCPS Clinical Pharmacist Pager [phone]

## 2015-09-01 NOTE — Progress Notes (Signed)
[facility] Progress Note   Jacqueline Reilly  [id number]  DOB: 1924/02/12  DOA: 08/31/2015 PCP: Irven Shelling, MD  Brief narrative: Jacqueline Reilly is a 81 y.o. female with chronic systolic CHF on a Milrinone infusion. Blood cultures were drawn as outpt due to leukocytosis and she was found to have gram neg rods in 4/4 blood cultures. She was referred to come to the ER for admission. The patient does not admit to fever or chills. No c/o cough, dysuria or diarrhea. No pain at PICC site and no discharge from around PICC.   Subjective: Jacqueline Reilly complains of feeling tired and is asking for me to allow her to rest. She has no other complaints. Husband is at bedside and assists in providing above history.   Assessment/Plan: Principal Problem:   Gram-negative bacteremia/ leukocytosis - UA negative - cont Zosyn - d/c PICC as this is likely the source- obtain peripheral IV to give Milrinone - f/u on final culture results - repeat cultures once PICC removed  Active Problems:   Chronic systolic CHF (congestive heart failure)  -cont Milrinone, Demadex, Lisinopril  A-fib - cont Amiodarone and Eliquis   Code Status:     Code Status Orders        Start     Ordered   09/01/15 0403  Limited resuscitation (code)   Continuous    Question Answer Comment  In the event of cardiac or respiratory ARREST: Initiate Code Blue, Call Rapid Response Yes   In the event of cardiac or respiratory ARREST: Perform CPR No   In the event of cardiac or respiratory ARREST: Perform Intubation/Mechanical Ventilation Yes   In the event of cardiac or respiratory ARREST: Use NIPPV/BiPAp only if indicated Yes   In the event of cardiac or respiratory ARREST: Administer ACLS medications if indicated Yes   In the event of cardiac or respiratory ARREST: Perform Defibrillation or Cardioversion if indicated No      09/01/15 0402     Family Communication: husband Disposition Plan:  DVT prophylaxis:  Eliquis Consultants: CHF team Procedures:   Antibiotics: Anti-infectives    Start     Dose/Rate Route Frequency Ordered Stop   09/02/15 0100  vancomycin (VANCOCIN) 500 mg in sodium chloride 0.9 % 100 mL IVPB  Status:  Discontinued     500 mg 100 mL/hr over 60 Minutes Intravenous Every 24 hours 09/01/15 0418 09/01/15 0804   09/01/15 0800  piperacillin-tazobactam (ZOSYN) IVPB 3.375 g     3.375 g 12.5 mL/hr over 240 Minutes Intravenous Every 8 hours 09/01/15 0418     08/31/15 2330  vancomycin (VANCOCIN) IVPB 750 mg/150 ml premix     750 mg 150 mL/hr over 60 Minutes Intravenous  Once 08/31/15 2318 09/01/15 0157   08/31/15 2330  piperacillin-tazobactam (ZOSYN) IVPB 3.375 g     3.375 g 100 mL/hr over 30 Minutes Intravenous  Once 08/31/15 2318 09/01/15 0057      Objective: Filed Weights   08/31/15 1829 08/31/15 2350 09/01/15 0413  Weight: 39.463 kg (87 lb) 39.463 kg (87 lb) 39.917 kg (88 lb)   No intake or output data in the 24 hours ending 09/01/15 1048   Vitals Filed Vitals:   09/01/15 0413 09/01/15 0415 09/01/15 0620 09/01/15 0719  BP:  132/68 122/105 109/60  Pulse:  64 65 64  Temp: 98.7 F (37.1 C) 98.7 F (37.1 C) 98.6 F (37 C) 98.7 F (37.1 C)  TempSrc: Oral Oral Oral Oral  Resp:  20 21  15  Height: 4\' 6"  (1.372 m)     Weight: 39.917 kg (88 lb)     SpO2:  97% 95% 98%    Exam:  General:  Pt is alert, not in acute distress  HEENT: No icterus, No thrush, oral mucosa moist  Cardiovascular: regular rate and rhythm, S1/S2 No murmur  Respiratory: clear to auscultation bilaterally   Abdomen: Soft, +Bowel sounds, non tender, non distended, no guarding  MSK: No LE edema, cyanosis or clubbing  Skin: skin around PICC is not inflamed, no discharge and no tenderness noted  Data Reviewed: Basic Metabolic Panel:  Recent Labs Lab 08/31/15 2041 09/01/15 0543  NA 136 138  K 4.0 3.7  CL 97* 102  CO2 27 28  GLUCOSE 91 86  BUN 19 16  CREATININE 0.98 0.90  CALCIUM  9.4 8.7*   Liver Function Tests:  Recent Labs Lab 08/31/15 2041 09/01/15 0543  AST 17 15  ALT 10* 7*  ALKPHOS 84 69  BILITOT 0.3 0.5  PROT 7.3 5.9*  ALBUMIN 3.4* 2.7*   No results for input(s): LIPASE, AMYLASE in the last 168 hours. No results for input(s): AMMONIA in the last 168 hours. CBC:  Recent Labs Lab 08/31/15 1842 09/01/15 0543  WBC 13.8* 11.4*  NEUTROABS  --  9.2*  HGB 9.8* 9.2*  HCT 30.7* 28.8*  MCV 81.9 82.5  PLT 296 247   Cardiac Enzymes: No results for input(s): CKTOTAL, CKMB, CKMBINDEX, TROPONINI in the last 168 hours. BNP (last 3 results)  Recent Labs  03/03/15 0740  BNP 249.0*    ProBNP (last 3 results)  Recent Labs  09/04/14 1240  PROBNP 1763.0*    CBG: No results for input(s): GLUCAP in the last 168 hours.  Recent Results (from the past 240 hour(s))  MRSA PCR Screening     Status: None   Collection Time: 09/01/15  4:03 AM  Result Value Ref Range Status   MRSA by PCR NEGATIVE NEGATIVE Final    Comment:        The GeneXpert MRSA Assay (FDA approved for NASAL specimens only), is one component of a comprehensive MRSA colonization surveillance program. It is not intended to diagnose MRSA infection nor to guide or monitor treatment for MRSA infections.      Studies: Dg Chest 2 View  08/31/2015  CLINICAL DATA:  81 year old female with chronic heart failure and leukocytosis EXAM: CHEST  2 VIEW COMPARISON:  Prior chest x-ray 09/04/2014 FINDINGS: Stable cardiomegaly with left heart enlargement. Atherosclerotic calcifications present throughout the transverse aorta. Left upper extremity dual-lumen PICC with the tip well positioned at the superior cavoatrial junction. Chronic bronchitic change and interstitial prominence. No focal airspace consolidation, pleural effusion or pneumothorax. No evidence of edema. No acute osseous abnormality. IMPRESSION: Stable chest x-ray without evidence of acute cardiopulmonary process. The tip of the left  upper extremity PICC is positioned at the superior cavoatrial junction. Electronically Signed   By: Jacqulynn Cadet M.D.   On: 08/31/2015 19:31    Scheduled Meds:  Scheduled Meds: . amiodarone  100 mg Oral Daily  . apixaban  2.5 mg Oral BID  . [START ON 09/02/2015] lisinopril  2.5 mg Oral Daily  . multivitamin with minerals  1 tablet Oral Daily  . omega-3 acid ethyl esters  1 g Oral Daily  . piperacillin-tazobactam (ZOSYN)  IV  3.375 g Intravenous Q8H  . potassium chloride SA  20 mEq Oral Daily  . sodium chloride  3 mL Intravenous Q12H  .  torsemide  40 mg Oral BID   Continuous Infusions: . milrinone 0.125 mcg/kg/min (09/01/15 0515)    Time spent on care of this patient: 81 min   [place], MD 09/01/2015, 10:48 AM  LOS: 1 day   [facility] Office  [phone] Pager - Text Page per [email or web address] If 7PM-7AM, please contact night-coverage [email or web address]

## 2015-09-01 NOTE — Care Management Note (Signed)
Case Management Note  Patient Details  Name: Jacqueline Reilly MRN: [id number] Date of Birth: [birth year]  Subjective/Objective:   Jacqueline Reilly is a 81 y.o. female with history of chronic systolic heart failure last EF measured was 20-25% on milrinone drip was referred to the ER after patient blood cultures grew positive for gram-negative rods. Patient has mild dementia and is at home with husband.  Pt has support of family. Pt is active with [facility] for [facility] RN services. Pt will need resumption orders for Home RN for Milrinone gtt and F2F.                 Action/Plan: CM will continue to monitor for additional needs.    Expected Discharge Date:                  Expected Discharge Plan:  [place]  In-House Referral:     Discharge planning Services  CM Consult  Post Acute Care Choice:  Home Health, Resumption of Svcs/PTA Provider Choice offered to:  Patient, Spouse  DME Arranged:  IV pump/equipment DME Agency:  [place]:  RN [facility] Agency:  [place]  Status of Service:  In process, will continue to follow  Medicare Important Message Given:    Date Medicare IM Given:    Medicare IM give by:    Date Additional Medicare IM Given:    Additional Medicare Important Message give by:     If discussed at [place] of Stay Meetings, dates discussed:    Additional Comments:  Bethena Roys, RN 09/01/2015, 10:18 AM

## 2015-09-01 NOTE — H&P (Signed)
[facility] History and Physical  Jacqueline Reilly [id number] DOB: 1924-02-20 DOA: 08/31/2015  Referring physician:Dr. Freda Munro. PCP: Irven Shelling, MD  Specialists: Dr. Tempie Hoist. Cardiologist.  Chief Complaint: Positive blood cultures.  HPI: Jacqueline Reilly is a 81 y.o. female with history of chronic systolic heart failure last EF measured was 20-25% on milrinone drip was referred to the ER after patient blood cultures grew positive for gram-negative rods. As per the ER physician were discussed with the home health agency 4 of the 4 blood culture grew gram-negative rods. Does not exactly known why the blood cultures were ordered. Results are called in to cardiology office and patient was referred to the ER. Patient has mild dementia and at this time I'm unable to reach patient's family. Patient denies any chest pain or shortness of breath. Patient has a PICC line on the arm which does not look infected externally. Repeat blood cultures were ordered in the ER. Patient's blood pressures in the low normals but does not look septic. Patient has been admitted for further management of gram-negative rods bacteremia.   Review of Systems: As presented in the history of presenting illness, rest negative.  Past Medical History  Diagnosis Date  . PVD (peripheral vascular disease) ([place])   . Ventricular dysfunction     left; ischemic  . Hyperlipidemia   . AAA (abdominal aortic aneurysm) ([place])   . Renal artery stenosis ([place])   . Hypertension   . Coronary artery disease 2007    moderate ASCAD of the left system s/p PCI of the D2 and PCI of the left circ 03/2009  . Chronic systolic heart failure ([place])     a. ECHO (04/2014) EF 20-25%, diff HK, mild MR  . Atrial fibrillation [facility])    Past Surgical History  Procedure Laterality Date  . Retinal cryopexy      right eye (for retinal detachment)  . Angioplasty    . Coronary stent placement     Social History:  reports that she has never  smoked. She has never used smokeless tobacco. She reports that she drinks alcohol. She reports that she does not use illicit drugs. Where does patient live home. Can patient participate in ADLs? Not sure.  Allergies  Allergen Reactions  . Codeine Nausea Only  . Garlic Nausea Only    Family History:  Family History  Problem Relation Age of Onset  . Heart disease Mother   . Heart disease Father   . Heart disease Brother     x 3      Prior to Admission medications   Medication Sig Start Date End Date Taking? Authorizing Provider  amiodarone (PACERONE) 200 MG tablet Take 100 mg by mouth daily.   Yes Historical Provider, MD  ELIQUIS 2.5 MG TABS tablet TAKE ONE TABLET BY MOUTH TWICE DAILY 08/08/15  Yes Larey Dresser, MD  lisinopril (ZESTRIL) 2.5 MG tablet Take 1 tablet (2.5 mg total) by mouth daily. 11/23/14  Yes Shaune Pascal Bensimhon, MD  milrinone [facility]) 20 MG/100ML SOLN infusion Inject 4.95 mcg/min into the vein continuous. Per [facility] 05/20/14  Yes Rhonda G Barrett, PA-C  Multiple Vitamin (MULTIVITAMIN) tablet Take 1 tablet by mouth daily. 07/26/14  Yes Larey Dresser, MD  Omega-3 Fatty Acids (FISH OIL) 1000 MG CAPS Take 1 capsule by mouth daily.   Yes Historical Provider, MD  potassium chloride SA (KLOR-CON M20) 20 MEQ tablet Take 1 tablet (20 mEq total) by mouth daily. 07/10/15  Yes Shaune Pascal  Bensimhon, MD  torsemide (DEMADEX) 20 MG tablet TAKE TWO TABLETS BY MOUTH TWICE DAILY 07/20/15  Yes Jolaine Artist, MD  nitroGLYCERIN (NITROSTAT) 0.4 MG SL tablet Place 1 tablet (0.4 mg total) under the tongue every 5 (five) minutes as needed for chest pain. Patient not taking: Reported on 06/20/2015 07/26/14   Larey Dresser, MD  Omega-3 Fatty Acids (FISH OIL) 1200 MG CAPS Take 1 capsule (1,200 mg total) by mouth daily. Patient not taking: Reported on 08/31/2015 07/26/14   Larey Dresser, MD    Physical Exam: Filed Vitals:   09/01/15 0015 09/01/15 0030 09/01/15 0100 09/01/15 0115   BP: 100/51 112/56 91/52 110/58  Pulse: 64 64 63 63  Temp:      TempSrc:      Resp: 15 17 16 18   Height:      Weight:      SpO2: 97% 98% 98% 100%     General:  Moderately built and poorly nourished.  Eyes: Anicteric no pallor.  ENT: No discharge from the ears eyes nose and mouth.  Neck: No JVD appreciated. No mass felt.  Cardiovascular: S1 and S2 heard.  Respiratory: No rhonchi or crepitations.  Abdomen: Soft nontender bowel sounds present.  Skin: No rash.  Musculoskeletal: No edema.  Psychiatric: Patient has mild dementia.  Neurologic: Alert awake oriented to her name only. Has dementia. Moves all extremities.  Labs on Admission:  Basic Metabolic Panel:  Recent Labs Lab 08/31/15 2041  NA 136  K 4.0  CL 97*  CO2 27  GLUCOSE 91  BUN 19  CREATININE 0.98  CALCIUM 9.4   Liver Function Tests:  Recent Labs Lab 08/31/15 2041  AST 17  ALT 10*  ALKPHOS 84  BILITOT 0.3  PROT 7.3  ALBUMIN 3.4*   No results for input(s): LIPASE, AMYLASE in the last 168 hours. No results for input(s): AMMONIA in the last 168 hours. CBC:  Recent Labs Lab 08/31/15 1842  WBC 13.8*  HGB 9.8*  HCT 30.7*  MCV 81.9  PLT 296   Cardiac Enzymes: No results for input(s): CKTOTAL, CKMB, CKMBINDEX, TROPONINI in the last 168 hours.  BNP (last 3 results)  Recent Labs  03/03/15 0740  BNP 249.0*    ProBNP (last 3 results)  Recent Labs  09/04/14 1240  PROBNP 1763.0*    CBG: No results for input(s): GLUCAP in the last 168 hours.  Radiological Exams on Admission: Dg Chest 2 View  08/31/2015  CLINICAL DATA:  81 year old female with chronic heart failure and leukocytosis EXAM: CHEST  2 VIEW COMPARISON:  Prior chest x-ray 09/04/2014 FINDINGS: Stable cardiomegaly with left heart enlargement. Atherosclerotic calcifications present throughout the transverse aorta. Left upper extremity dual-lumen PICC with the tip well positioned at the superior cavoatrial junction. Chronic  bronchitic change and interstitial prominence. No focal airspace consolidation, pleural effusion or pneumothorax. No evidence of edema. No acute osseous abnormality. IMPRESSION: Stable chest x-ray without evidence of acute cardiopulmonary process. The tip of the left upper extremity PICC is positioned at the superior cavoatrial junction. Electronically Signed   By: Jacqulynn Cadet M.D.   On: 08/31/2015 19:31     Assessment/Plan Principal Problem:   Gram-negative bacteremia (HCC) Active Problems:   Chronic systolic CHF (congestive heart failure) (HCC)   Chronic anemia   1. Gram-negative rod bacteremia - source not clear. Patient has PICC line for patient's milrinone infusion. Repeat blood cultures have been obtained. Urine cultures and UA are pending. At this time patient has been  placed on vancomycin and Zosyn. May need infectious disease consult in a.m. Patient does not look septic at this time. 2. Chronic systolic heart failure last EF measured was 20-25% July 2015 - patient is on milrinone drip which will be continued. Patient is also on Demadex and lisinopril which will be dose for tomorrow as patient blood pressure was in the low normal on admission. Positive for blood pressure trends is still in the low-normal may have to hold antihypertensives and diuretics. 3. Chronic anemia - follow CBC. 4. Chronic atrial fibrillation - rate controlled. On amiodarone. Patient's chads 2 vasc score is more than 2 and patient is on Apixaban. 5. CAD - denies any chest pain. Off aspirin secondary to patient being on Apixaban. 6. History of abdominal artery aneurysm denies any abdominal pain.  At this time is unable to reach patient's family may need to take further history once patient's family is available. Patient has dementia and is unable to provide history. I have reviewed patient's old charts on labs. Personally reviewed patient's x-ray.   DVT Prophylaxis Apixaban.  Code Status: Limited code with  no CPR but okay with intubation.  Family communication - unable to reach family.  Disposition Plan: Admit to inpatient.    Tetsuo Coppola N. [facility] Pager [phone].  If 7PM-7AM, please contact night-coverage [email or web address] Password [email or web address] 09/01/2015, 1:21 AM

## 2015-09-01 NOTE — Consult Note (Signed)
Cardiologist:  [place] Reason for Consult: CHF Referring Physician:   [name] is an 81 y.o. female.  HPI:   Jacqueline Reilly is an 81 y/o woman with CAD, AAA, systolic HF, mild dementia, afib and CAD. She has had two previous coronary interventions including a cutting balloon to her D2 in 2007 and a DES to her mid LCX in 2010. Her LV dysfunction has been out of proportion to her CAD. Last echo in 7/15 showed EF 20-25% with severe LV dilation, mild MR, and PA systolic pressure 38 mmHg.   Admitted 7/11-7/24/15 with syncope and dyspnea. She had new onset A-fib/RVR. Troponin was 1.03> 1.59> 2.18 . She was placed on amiodarone and heparin drip. She developed respiratory distress and required intubation. She converted to NSR but later was bradycardiac with NSVT. Amiodarone temporarily stopped but restarted after hyperkalemia resolved. Hyperkalemia was thought to be contributing to bradycardia. Diuresed with IV lasix. Co-ox 29% and started on milrinone which we tried to wean off but she did not tolerate. Discharge weight 87 lbs.   She was seen for HF follow up on 9/29 with Dr. Haroldine Laws.  Her brother accompanied her. She is stable on home milrinone. Her husband has dismissed her home care attendant against our advice and is now dispensing her medicines.  [facility] does come out on Wednesdays to refill milrinone. Her brother is very concerned that she is not getting her medications properly as her husband with dementia insists on dispensing them. Her brother does make the pill boxes for the week,though. He states that her husband misses giving her doses on occasion. He also does not weigh her regularly. She walks with a cane and has no dyspnea walking on flat ground but is short of breath with steps/inclines.  Echo 8/16 EF 25% with diffuse hypokinesis, mild AS/mild AI, RV mildly dilated with moderately decreased systolic function.   The patient had blood cultures collected on 11/2 which revealed  Enterobacter asburiae.  She was advised to come to the hospital for treatment.  She reports feeling well and does not voice any particular complaint.  Her breathing is better.  The patient currently denies nausea, vomiting, fever, chest pain, shortness of breath, orthopnea, dizziness, PND, cough, congestion, abdominal pain, hematochezia, melena, lower extremity edema.   Past Medical History  Diagnosis Date  . PVD (peripheral vascular disease) ([place])   . Ventricular dysfunction     left; ischemic  . Hyperlipidemia   . AAA (abdominal aortic aneurysm) ([place])   . Renal artery stenosis ([place])   . Hypertension   . Coronary artery disease 2007    moderate ASCAD of the left system s/p PCI of the D2 and PCI of the left circ 03/2009  . Chronic systolic heart failure ([place])     a. ECHO (04/2014) EF 20-25%, diff HK, mild MR  . Atrial fibrillation [facility])     Past Surgical History  Procedure Laterality Date  . Retinal cryopexy      right eye (for retinal detachment)  . Angioplasty    . Coronary stent placement      Family History  Problem Relation Age of Onset  . Heart disease Mother   . Heart disease Father   . Heart disease Brother     x 3    Social History:  reports that she has never smoked. She has never used smokeless tobacco. She reports that she drinks alcohol. She reports that she does not use illicit drugs.  Allergies:  Allergies  Allergen Reactions  . Codeine Nausea Only  . Garlic Nausea Only    Medications:  Scheduled Meds: . amiodarone  100 mg Oral Daily  . apixaban  2.5 mg Oral BID  . [START ON 09/02/2015] lisinopril  2.5 mg Oral Daily  . multivitamin with minerals  1 tablet Oral Daily  . omega-3 acid ethyl esters  1 g Oral Daily  . piperacillin-tazobactam (ZOSYN)  IV  3.375 g Intravenous Q8H  . potassium chloride SA  20 mEq Oral Daily  . sodium chloride  3 mL Intravenous Q12H  . torsemide  40 mg Oral BID   Continuous Infusions: . milrinone 0.125 mcg/kg/min (09/01/15  0515)   PRN Meds:.acetaminophen **OR** acetaminophen, guaiFENesin, ondansetron **OR** ondansetron (ZOFRAN) IV   Results for orders placed or performed during the hospital encounter of 08/31/15 (from the past 48 hour(s))  CBC     Status: Abnormal   Collection Time: 08/31/15  6:42 PM  Result Value Ref Range   WBC 13.8 (H) 4.0 - 10.5 K/uL   RBC 3.75 (L) 3.87 - 5.11 MIL/uL   Hemoglobin 9.8 (L) 12.0 - 15.0 g/dL   HCT 30.7 (L) 36.0 - 46.0 %   MCV 81.9 78.0 - 100.0 fL   MCH 26.1 26.0 - 34.0 pg   MCHC 31.9 30.0 - 36.0 g/dL   RDW 15.8 (H) 11.5 - 15.5 %   Platelets 296 150 - 400 K/uL  I-Stat CG4 Lactic Acid, ED  (not at [facility])     Status: None   Collection Time: 08/31/15  6:54 PM  Result Value Ref Range   Lactic Acid, Venous 1.43 0.5 - 2.0 mmol/L  Comprehensive metabolic panel     Status: Abnormal   Collection Time: 08/31/15  8:41 PM  Result Value Ref Range   Sodium 136 135 - 145 mmol/L   Potassium 4.0 3.5 - 5.1 mmol/L   Chloride 97 (L) 101 - 111 mmol/L   CO2 27 22 - 32 mmol/L   Glucose, Bld 91 65 - 99 mg/dL   BUN 19 6 - 20 mg/dL   Creatinine, Ser 0.98 0.44 - 1.00 mg/dL   Calcium 9.4 8.9 - 10.3 mg/dL   Total Protein 7.3 6.5 - 8.1 g/dL   Albumin 3.4 (L) 3.5 - 5.0 g/dL   AST 17 15 - 41 U/L   ALT 10 (L) 14 - 54 U/L   Alkaline Phosphatase 84 38 - 126 U/L   Total Bilirubin 0.3 0.3 - 1.2 mg/dL   GFR calc non Af Amer 50 (L) >60 mL/min   GFR calc Af Amer 58 (L) >60 mL/min    Comment: (NOTE) The eGFR has been calculated using the CKD EPI equation. This calculation has not been validated in all clinical situations. eGFR's persistently <60 mL/min signify possible Chronic Kidney Disease.    Anion gap 12 5 - 15  I-Stat CG4 Lactic Acid, ED  (not at [facility])     Status: None   Collection Time: 08/31/15 10:37 PM  Result Value Ref Range   Lactic Acid, Venous 0.65 0.5 - 2.0 mmol/L  Urinalysis, Routine w reflex microscopic (not at [facility])     Status: None   Collection Time: 08/31/15 11:38 PM   Result Value Ref Range   Color, Urine YELLOW YELLOW   APPearance CLEAR CLEAR   Specific Gravity, Urine 1.011 1.005 - 1.030   pH 5.0 5.0 - 8.0   Glucose, UA NEGATIVE NEGATIVE mg/dL   Hgb urine dipstick NEGATIVE NEGATIVE   Bilirubin  Urine NEGATIVE NEGATIVE   Ketones, ur NEGATIVE NEGATIVE mg/dL   Protein, ur NEGATIVE NEGATIVE mg/dL   Urobilinogen, UA 0.2 0.0 - 1.0 mg/dL   Nitrite NEGATIVE NEGATIVE   Leukocytes, UA NEGATIVE NEGATIVE    Comment: MICROSCOPIC NOT DONE ON URINES WITH NEGATIVE PROTEIN, BLOOD, LEUKOCYTES, NITRITE, OR GLUCOSE <1000 mg/dL.  I-Stat CG4 Lactic Acid, ED  (not at  [facility])     Status: None   Collection Time: 09/01/15  3:31 AM  Result Value Ref Range   Lactic Acid, Venous 0.91 0.5 - 2.0 mmol/L  MRSA PCR Screening     Status: None   Collection Time: 09/01/15  4:03 AM  Result Value Ref Range   MRSA by PCR NEGATIVE NEGATIVE    Comment:        The GeneXpert MRSA Assay (FDA approved for NASAL specimens only), is one component of a comprehensive MRSA colonization surveillance program. It is not intended to diagnose MRSA infection nor to guide or monitor treatment for MRSA infections.   Comprehensive metabolic panel     Status: Abnormal   Collection Time: 09/01/15  5:43 AM  Result Value Ref Range   Sodium 138 135 - 145 mmol/L   Potassium 3.7 3.5 - 5.1 mmol/L   Chloride 102 101 - 111 mmol/L   CO2 28 22 - 32 mmol/L   Glucose, Bld 86 65 - 99 mg/dL   BUN 16 6 - 20 mg/dL   Creatinine, Ser 0.90 0.44 - 1.00 mg/dL   Calcium 8.7 (L) 8.9 - 10.3 mg/dL   Total Protein 5.9 (L) 6.5 - 8.1 g/dL   Albumin 2.7 (L) 3.5 - 5.0 g/dL   AST 15 15 - 41 U/L   ALT 7 (L) 14 - 54 U/L   Alkaline Phosphatase 69 38 - 126 U/L   Total Bilirubin 0.5 0.3 - 1.2 mg/dL   GFR calc non Af Amer 55 (L) >60 mL/min   GFR calc Af Amer >60 >60 mL/min    Comment: (NOTE) The eGFR has been calculated using the CKD EPI equation. This calculation has not been validated in all clinical situations. eGFR's  persistently <60 mL/min signify possible Chronic Kidney Disease.    Anion gap 8 5 - 15  CBC WITH DIFFERENTIAL     Status: Abnormal   Collection Time: 09/01/15  5:43 AM  Result Value Ref Range   WBC 11.4 (H) 4.0 - 10.5 K/uL   RBC 3.49 (L) 3.87 - 5.11 MIL/uL   Hemoglobin 9.2 (L) 12.0 - 15.0 g/dL   HCT 28.8 (L) 36.0 - 46.0 %   MCV 82.5 78.0 - 100.0 fL   MCH 26.4 26.0 - 34.0 pg   MCHC 31.9 30.0 - 36.0 g/dL   RDW 15.7 (H) 11.5 - 15.5 %   Platelets 247 150 - 400 K/uL   Neutrophils Relative % 81 %   Neutro Abs 9.2 (H) 1.7 - 7.7 K/uL   Lymphocytes Relative 11 %   Lymphs Abs 1.3 0.7 - 4.0 K/uL   Monocytes Relative 7 %   Monocytes Absolute 0.8 0.1 - 1.0 K/uL   Eosinophils Relative 1 %   Eosinophils Absolute 0.2 0.0 - 0.7 K/uL   Basophils Relative 0 %   Basophils Absolute 0.0 0.0 - 0.1 K/uL  Glucose, capillary     Status: None   Collection Time: 09/01/15 11:17 AM  Result Value Ref Range   Glucose-Capillary 91 65 - 99 mg/dL   Comment 1 Notify RN    Comment 2  Document in Chart   Urinalysis, Routine w reflex microscopic (not at [facility])     Status: None   Collection Time: 09/01/15 12:54 PM  Result Value Ref Range   Color, Urine YELLOW YELLOW   APPearance CLEAR CLEAR   Specific Gravity, Urine 1.012 1.005 - 1.030   pH 6.0 5.0 - 8.0   Glucose, UA NEGATIVE NEGATIVE mg/dL   Hgb urine dipstick NEGATIVE NEGATIVE   Bilirubin Urine NEGATIVE NEGATIVE   Ketones, ur NEGATIVE NEGATIVE mg/dL   Protein, ur NEGATIVE NEGATIVE mg/dL   Urobilinogen, UA 0.2 0.0 - 1.0 mg/dL   Nitrite NEGATIVE NEGATIVE   Leukocytes, UA NEGATIVE NEGATIVE    Comment: MICROSCOPIC NOT DONE ON URINES WITH NEGATIVE PROTEIN, BLOOD, LEUKOCYTES, NITRITE, OR GLUCOSE <1000 mg/dL.    Dg Chest 2 View  08/31/2015  CLINICAL DATA:  81 year old female with chronic heart failure and leukocytosis EXAM: CHEST  2 VIEW COMPARISON:  Prior chest x-ray 09/04/2014 FINDINGS: Stable cardiomegaly with left heart enlargement. Atherosclerotic  calcifications present throughout the transverse aorta. Left upper extremity dual-lumen PICC with the tip well positioned at the superior cavoatrial junction. Chronic bronchitic change and interstitial prominence. No focal airspace consolidation, pleural effusion or pneumothorax. No evidence of edema. No acute osseous abnormality. IMPRESSION: Stable chest x-ray without evidence of acute cardiopulmonary process. The tip of the left upper extremity PICC is positioned at the superior cavoatrial junction. Electronically Signed   By: Jacqulynn Cadet M.D.   On: 08/31/2015 19:31    Review of Systems  All other systems reviewed and are negative.  The patient currently denies nausea, vomiting, fever, chest pain, shortness of breath, orthopnea, dizziness, PND, cough, congestion, abdominal pain, hematochezia, melena, lower extremity edema, claudication.   Blood pressure 109/60, pulse 64, temperature 98.7 F (37.1 C), temperature source Oral, resp. rate 15, height 4' 6"  (1.372 m), weight 88 lb (39.917 kg), SpO2 98 %. Physical Exam Well nourished, well developed, in no acute distress HEENT: Pupils are equal round react to light accommodation extraocular movements are intact.  Neck: no JVDNo cervical lymphadenopathy. Cardiac: Regular rate and rhythm with soft 1/6 MM. Lungs:  clear to auscultation bilaterally, no wheezing, rhonchi or rales Abd: soft, nontender, positive bowel sounds all quadrants, no hepatosplenomegaly Ext: no lower extremity edema.  2+ radial pulses. Skin: warm and dry Neuro:  Grossly normal   Assessment/Plan:     Gram-negative bacteremia (HCC) On zosyn.  UA negative.  PICC discontinued as it may be the source.     Chronic systolic CHF (congestive heart failure) (HCC) Previous DC weight 87 pounds and now 88.  Current meds are milrinone, torsemide 40 bid, lisinopril 2.5.  Continue to monitor I/O's and daily weights.  No acute cardiopulmonary disease seen on CXR.  BP stable.      Chronic anemia: stable   Persistent atrial fibrillation (HCC)  Maintaining NSR on amiodarone 180m daily.  Eliquis 2.586mBID.   HATarri FullerPAHardin Medical Center1/01/2015, 2:52 PM   Patient seen and examined with DaMelina CopaPA-C. We discussed all aspects of the encounter. I agree with the assessment and plan as stated above.   HF is currently stable. She has GNR bacteremia (enterobacter asburiae) senstive to zosyn, ceftriaxone, quinolones and bactrim.. Marland KitchenICC is out. IV abx as per IM (appreciate their assistance) would consider switching to ceftriaxone for ease of dosing. Can run milrinone peripherally. Once surveillance cultures negative will replace PICC.  Jacqueline Corliss,MD 4:34 PM

## 2015-09-01 NOTE — Progress Notes (Signed)
CRITICAL VALUE ALERT  Critical value received:  Blood cultures are both growing gram negative rods  Date of notification:  09/01/15  Time of notification:  1615  Critical value read back:Yes.    Nurse who received alert:  Donnella Bi, RN  MD notified (1st page):  CHF team paged  Time of first page:  1618  Responding MD:  Dr. Haroldine Laws

## 2015-09-01 NOTE — Progress Notes (Signed)
[facility]  Patient Status:   Active pt with [facility] prior to this admission  [facility] is providing the following services: HHRN and [facility] team for home Milrinone.  [facility] hospital team will follow Jacqueline Reilly while inpatient to support transition home when deemed appropriate.    If patient discharges after hours, please call [phone].   [name] 09/01/2015, 9:41 AM

## 2015-09-01 NOTE — Progress Notes (Signed)
UR Completed Emerie Vanderkolk Graves-Bigelow, RN,BSN [phone]  

## 2015-09-02 LAB — BASIC METABOLIC PANEL
Anion gap: 9 (ref 5–15)
BUN: 14 mg/dL (ref 6–20)
CO2: 27 mmol/L (ref 22–32)
Calcium: 8.5 mg/dL — ABNORMAL LOW (ref 8.9–10.3)
Chloride: 104 mmol/L (ref 101–111)
Creatinine, Ser: 0.88 mg/dL (ref 0.44–1.00)
GFR calc Af Amer: >60 mL/min (ref >60)
GFR calc non Af Amer: 57 mL/min — ABNORMAL LOW (ref >60)
Glucose, Bld: 87 mg/dL (ref 65–99)
Potassium: 3.6 mmol/L (ref 3.5–5.1)
Sodium: 140 mmol/L (ref 135–145)

## 2015-09-02 LAB — URINE CULTURE

## 2015-09-02 MED ORDER — DEXTROSE 5 % IV SOLN
2.0000 g | INTRAVENOUS | Status: DC
  Administered 2015-09-02 – 2015-09-04 (×3): 2 g via INTRAVENOUS
  Filled 2015-09-02 (×4): qty 2

## 2015-09-02 NOTE — Progress Notes (Addendum)
Advanced Heart Failure Rounding Note   Subjective:     No complaints. Wants to go home.    Objective:   Weight Range:  Vital Signs:   Temp:  [97.7 F (36.5 C)-98.7 F (37.1 C)] 97.7 F (36.5 C) (11/05 0605) Pulse Rate:  [53-62] 53 (11/05 0605) BP: (95-101)/(56-58) 100/57 mmHg (11/05 0605) SpO2:  [91 %-97 %] 97 % (11/05 0605) Weight:  [41.867 kg (92 lb 4.8 oz)] 41.867 kg (92 lb 4.8 oz) (11/05 0605) Last BM Date: 08/30/15  Weight change: Filed Weights   08/31/15 2350 09/01/15 0413 09/02/15 0605  Weight: 39.463 kg (87 lb) 39.917 kg (88 lb) 41.867 kg (92 lb 4.8 oz)    Intake/Output:   Intake/Output Summary (Last 24 hours) at 09/02/15 0835 Last data filed at 09/01/15 1254  Gross per 24 hour  Intake      0 ml  Output    400 ml  Net   -400 ml     Physical Exam: General: [place]. No resp difficulty HEENT: normal Neck: supple. JVP . Carotids 2+ bilat; no bruits. No lymphadenopathy or thryomegaly appreciated. Cor: PMI laterlally displaced. Regular rate & rhythm. 2/6 MR Lungs: clear Abdomen: soft, nontender, nondistended. No hepatosplenomegaly. No bruits or masses. Good bowel sounds. Extremities: no cyanosis, clubbing, rash, edema Neuro: alert & orientedx3, cranial nerves grossly intact. moves all 4 extremities w/o difficulty. Affect pleasant  Telemetry: NSR  Labs: Basic Metabolic Panel:  Recent Labs Lab 08/31/15 2041 09/01/15 0543 09/02/15 0222  NA 136 138 140  K 4.0 3.7 3.6  CL 97* 102 104  CO2 27 28 27   GLUCOSE 91 86 87  BUN 19 16 14   CREATININE 0.98 0.90 0.88  CALCIUM 9.4 8.7* 8.5*    Liver Function Tests:  Recent Labs Lab 08/31/15 2041 09/01/15 0543  AST 17 15  ALT 10* 7*  ALKPHOS 84 69  BILITOT 0.3 0.5  PROT 7.3 5.9*  ALBUMIN 3.4* 2.7*   No results for input(s): LIPASE, AMYLASE in the last 168 hours. No results for input(s): AMMONIA in the last 168 hours.  CBC:  Recent Labs Lab 08/31/15 1842 09/01/15 0543  WBC 13.8* 11.4*    NEUTROABS  --  9.2*  HGB 9.8* 9.2*  HCT 30.7* 28.8*  MCV 81.9 82.5  PLT 296 247    Cardiac Enzymes: No results for input(s): CKTOTAL, CKMB, CKMBINDEX, TROPONINI in the last 168 hours.  BNP: BNP (last 3 results)  Recent Labs  03/03/15 0740  BNP 249.0*    ProBNP (last 3 results)  Recent Labs  09/04/14 1240  PROBNP 1763.0*      Other results:  Imaging: Dg Chest 2 View  08/31/2015  CLINICAL DATA:  81 year old female with chronic heart failure and leukocytosis EXAM: CHEST  2 VIEW COMPARISON:  Prior chest x-ray 09/04/2014 FINDINGS: Stable cardiomegaly with left heart enlargement. Atherosclerotic calcifications present throughout the transverse aorta. Left upper extremity dual-lumen PICC with the tip well positioned at the superior cavoatrial junction. Chronic bronchitic change and interstitial prominence. No focal airspace consolidation, pleural effusion or pneumothorax. No evidence of edema. No acute osseous abnormality. IMPRESSION: Stable chest x-ray without evidence of acute cardiopulmonary process. The tip of the left upper extremity PICC is positioned at the superior cavoatrial junction. Electronically Signed   By: Jacqulynn Cadet M.D.   On: 08/31/2015 19:31      Medications:     Scheduled Medications: . amiodarone  100 mg Oral Daily  . apixaban  2.5 mg Oral  BID  . cefTRIAXone (ROCEPHIN)  IV  2 g Intravenous Q24H  . lisinopril  2.5 mg Oral Daily  . multivitamin with minerals  1 tablet Oral Daily  . omega-3 acid ethyl esters  1 g Oral Daily  . potassium chloride SA  20 mEq Oral Daily  . sodium chloride  3 mL Intravenous Q12H  . torsemide  40 mg Oral BID     Infusions: . milrinone 0.125 mcg/kg/min (09/01/15 0515)     PRN Medications:  acetaminophen **OR** acetaminophen, guaiFENesin, ondansetron **OR** ondansetron (ZOFRAN) IV   Assessment:   1. Gram-negative bacteremia ([place]) - enterobacter asburiae  - On zosyn. PICC discontinued as it may be the  source.  - senstive to zosyn, ceftriaxone, quinolones and bactrim..  2. Chronic systolic CHF (congestive heart failure) ([place]) on home mirlinone  3. Paroxysmal atrial fibrillation ([facility]) Maintaining NSR on amiodarone 100mg  daily. Eliquis 2.5mg  BID.  Plan/Discussion:    Weight up. Will resume home diuretics. Continue milrinone through PIV. She has GNR bacteremia (enterobacter asburiae) senstive to zosyn, ceftriaxone, quinolones and bactrim..Now on ceftriaxone.  PICC is out. IV abx as per IM. Once surveillance cultures negative will replace PICC and can d/c home at that point.  We will see again on Monday. Please call with ?s  Length of Stay: 2   Glori Bickers MD 09/02/2015, 8:35 AM  Advanced Heart Failure Team Pager [phone] (M-F; [place])  Please contact [facility] for night-coverage after hours (4p -7a ) and weekends on [email or web address]

## 2015-09-02 NOTE — Progress Notes (Signed)
[facility] Progress Note   Jacqueline Reilly  [id number]  DOB: Apr 11, 1924  DOA: 08/31/2015 PCP: Irven Shelling, MD  Brief narrative: Jacqueline Reilly is a 81 y.o. female with chronic systolic CHF on a Milrinone infusion. Blood cultures were drawn as outpt due to leukocytosis and she was found to have gram neg rods in 4/4 blood cultures. She was referred to come to the ER for admission. The patient does not admit to fever or chills. No c/o cough, dysuria or diarrhea. No pain at PICC site and no discharge from around PICC.   Subjective: Jacqueline Reilly complains of feeling tired and is asking for me to allow her to rest. She has no other complaints. Husband is at bedside and assists in providing above history.   Assessment/Plan: Principal Problem: Enterobacter bacteremia/ leukocytosis - UA negative - sensitive to Rocephin which has been started- will need 2 wks of antibiotics- will transition to [place] on discharge  - PICC removed on 11/4 as this is likely the source- obtained peripheral IV to give Milrinone - f/u on final culture results - repeat cultures today to ensure clearance  Active Problems:   Chronic systolic CHF (congestive heart failure)  -cont Milrinone, Demadex, Lisinopril- follow daily weights and I and Os  A-fib - cont Amiodarone and Eliquis  Dementia - beginning to have sundowning but not severe enough to warrant antipsychotics- will try to avoid if possible due to interaction with Amiodarone in prolonging QT interval   Code Status:     Code Status Orders        Start     Ordered   09/01/15 0403  Limited resuscitation (code)   Continuous    Question Answer Comment  In the event of cardiac or respiratory ARREST: Initiate Code Blue, Call Rapid Response Yes   In the event of cardiac or respiratory ARREST: Perform CPR No   In the event of cardiac or respiratory ARREST: Perform Intubation/Mechanical Ventilation Yes   In the event of cardiac or  respiratory ARREST: Use NIPPV/BiPAp only if indicated Yes   In the event of cardiac or respiratory ARREST: Administer ACLS medications if indicated Yes   In the event of cardiac or respiratory ARREST: Perform Defibrillation or Cardioversion if indicated No      09/01/15 0402     Family Communication: husband Disposition Plan: home when stable DVT prophylaxis: Eliquis Consultants: CHF team Procedures:   Antibiotics: Anti-infectives    Start     Dose/Rate Route Frequency Ordered Stop   09/02/15 0800  cefTRIAXone (ROCEPHIN) 2 g in dextrose 5 % 50 mL IVPB     2 g 100 mL/hr over 30 Minutes Intravenous Every 24 hours 09/02/15 0718     09/02/15 0100  vancomycin (VANCOCIN) 500 mg in sodium chloride 0.9 % 100 mL IVPB  Status:  Discontinued     500 mg 100 mL/hr over 60 Minutes Intravenous Every 24 hours 09/01/15 0418 09/01/15 0804   09/01/15 0800  piperacillin-tazobactam (ZOSYN) IVPB 3.375 g  Status:  Discontinued     3.375 g 12.5 mL/hr over 240 Minutes Intravenous Every 8 hours 09/01/15 0418 09/02/15 0718   08/31/15 2330  vancomycin (VANCOCIN) IVPB 750 mg/150 ml premix     750 mg 150 mL/hr over 60 Minutes Intravenous  Once 08/31/15 2318 09/01/15 0157   08/31/15 2330  piperacillin-tazobactam (ZOSYN) IVPB 3.375 g     3.375 g 100 mL/hr over 30 Minutes Intravenous  Once 08/31/15 2318 09/01/15 0057  Objective: Filed Weights   08/31/15 2350 09/01/15 0413 09/02/15 0605  Weight: 39.463 kg (87 lb) 39.917 kg (88 lb) 41.867 kg (92 lb 4.8 oz)    Intake/Output Summary (Last 24 hours) at 09/02/15 0906 Last data filed at 09/01/15 1254  Gross per 24 hour  Intake      0 ml  Output    400 ml  Net   -400 ml     Vitals Filed Vitals:   09/01/15 0719 09/01/15 1700 09/01/15 2045 09/02/15 0605  BP: 109/60 95/56 101/58 100/57  Pulse: 64 62 59 53  Temp: 98.7 F (37.1 C)  98.7 F (37.1 C) 97.7 F (36.5 C)  TempSrc: Oral  Oral Axillary  Resp: 15     Height:      Weight:    41.867 kg (92 lb  4.8 oz)  SpO2: 98% 96% 91% 97%    Exam:  General:  Pt is alert, not in acute distress  HEENT: No icterus, No thrush, oral mucosa moist  Cardiovascular: regular rate and rhythm, S1/S2 No murmur  Respiratory: clear to auscultation bilaterally   Abdomen: Soft, +Bowel sounds, non tender, non distended, no guarding  MSK: No LE edema, cyanosis or clubbing  Skin: skin around PICC is not inflamed, no discharge and no tenderness noted  Data Reviewed: Basic Metabolic Panel:  Recent Labs Lab 08/31/15 2041 09/01/15 0543 09/02/15 0222  NA 136 138 140  K 4.0 3.7 3.6  CL 97* 102 104  CO2 27 28 27   GLUCOSE 91 86 87  BUN 19 16 14   CREATININE 0.98 0.90 0.88  CALCIUM 9.4 8.7* 8.5*   Liver Function Tests:  Recent Labs Lab 08/31/15 2041 09/01/15 0543  AST 17 15  ALT 10* 7*  ALKPHOS 84 69  BILITOT 0.3 0.5  PROT 7.3 5.9*  ALBUMIN 3.4* 2.7*   No results for input(s): LIPASE, AMYLASE in the last 168 hours. No results for input(s): AMMONIA in the last 168 hours. CBC:  Recent Labs Lab 08/31/15 1842 09/01/15 0543  WBC 13.8* 11.4*  NEUTROABS  --  9.2*  HGB 9.8* 9.2*  HCT 30.7* 28.8*  MCV 81.9 82.5  PLT 296 247   Cardiac Enzymes: No results for input(s): CKTOTAL, CKMB, CKMBINDEX, TROPONINI in the last 168 hours. BNP (last 3 results)  Recent Labs  03/03/15 0740  BNP 249.0*    ProBNP (last 3 results)  Recent Labs  09/04/14 1240  PROBNP 1763.0*    CBG:  Recent Labs Lab 09/01/15 1117  GLUCAP 91    Recent Results (from the past 240 hour(s))  Culture, blood (routine x 2)     Status: None (Preliminary result)   Collection Time: 08/31/15 10:20 PM  Result Value Ref Range Status   Specimen Description BLOOD RIGHT ARM  Final   Special Requests BOTTLES DRAWN AEROBIC AND ANAEROBIC 5ML  Final   Culture  Setup Time   Final    GRAM NEGATIVE RODS ANAEROBIC BOTTLE ONLY CRITICAL RESULT CALLED TO, READ BACK BY AND VERIFIED WITH: V CUMMINGS,RN AT 1501 09/01/15 BY L  BENFIELD    Culture GRAM NEGATIVE RODS  Final   Report Status PENDING  Incomplete  Urine culture     Status: None (Preliminary result)   Collection Time: 08/31/15 11:39 PM  Result Value Ref Range Status   Specimen Description URINE, CLEAN CATCH  Final   Special Requests NONE  Final   Culture NO GROWTH < 24 HOURS  Final   Report Status PENDING  Incomplete  MRSA PCR Screening     Status: None   Collection Time: 09/01/15  4:03 AM  Result Value Ref Range Status   MRSA by PCR NEGATIVE NEGATIVE Final    Comment:        The GeneXpert MRSA Assay (FDA approved for NASAL specimens only), is one component of a comprehensive MRSA colonization surveillance program. It is not intended to diagnose MRSA infection nor to guide or monitor treatment for MRSA infections.      Studies: Dg Chest 2 View  08/31/2015  CLINICAL DATA:  81 year old female with chronic heart failure and leukocytosis EXAM: CHEST  2 VIEW COMPARISON:  Prior chest x-ray 09/04/2014 FINDINGS: Stable cardiomegaly with left heart enlargement. Atherosclerotic calcifications present throughout the transverse aorta. Left upper extremity dual-lumen PICC with the tip well positioned at the superior cavoatrial junction. Chronic bronchitic change and interstitial prominence. No focal airspace consolidation, pleural effusion or pneumothorax. No evidence of edema. No acute osseous abnormality. IMPRESSION: Stable chest x-ray without evidence of acute cardiopulmonary process. The tip of the left upper extremity PICC is positioned at the superior cavoatrial junction. Electronically Signed   By: Jacqulynn Cadet M.D.   On: 08/31/2015 19:31    Scheduled Meds:  Scheduled Meds: . amiodarone  100 mg Oral Daily  . apixaban  2.5 mg Oral BID  . cefTRIAXone (ROCEPHIN)  IV  2 g Intravenous Q24H  . lisinopril  2.5 mg Oral Daily  . multivitamin with minerals  1 tablet Oral Daily  . omega-3 acid ethyl esters  1 g Oral Daily  . potassium chloride SA   20 mEq Oral Daily  . sodium chloride  3 mL Intravenous Q12H  . torsemide  40 mg Oral BID   Continuous Infusions: . milrinone 0.125 mcg/kg/min (09/01/15 0515)    Time spent on care of this patient: 29 min   [place], MD 09/02/2015, 9:06 AM  LOS: 2 days   [facility] Office  [phone] Pager - Text Page per [email or web address] If 7PM-7AM, please contact night-coverage [email or web address]

## 2015-09-03 LAB — BASIC METABOLIC PANEL
Anion gap: 12 (ref 5–15)
BUN: 14 mg/dL (ref 6–20)
CO2: 26 mmol/L (ref 22–32)
Calcium: 8.9 mg/dL (ref 8.9–10.3)
Chloride: 102 mmol/L (ref 101–111)
Creatinine, Ser: 0.88 mg/dL (ref 0.44–1.00)
GFR calc Af Amer: >60 mL/min (ref >60)
GFR calc non Af Amer: 57 mL/min — ABNORMAL LOW (ref >60)
Glucose, Bld: 90 mg/dL (ref 65–99)
Potassium: 3.6 mmol/L (ref 3.5–5.1)
Sodium: 140 mmol/L (ref 135–145)

## 2015-09-03 LAB — CBC
HCT: 34.3 % — ABNORMAL LOW (ref 36.0–46.0)
Hemoglobin: 10.4 g/dL — ABNORMAL LOW (ref 12.0–15.0)
MCH: 25 pg — ABNORMAL LOW (ref 26.0–34.0)
MCHC: 30.3 g/dL (ref 30.0–36.0)
MCV: 82.5 fL (ref 78.0–100.0)
Platelets: 299 10*3/uL (ref 150–400)
RBC: 4.16 MIL/uL (ref 3.87–5.11)
RDW: 15.5 % (ref 11.5–15.5)
WBC: 7.8 10*3/uL (ref 4.0–10.5)

## 2015-09-03 MED ORDER — POTASSIUM CHLORIDE CRYS ER 20 MEQ PO TBCR
20.0000 meq | EXTENDED_RELEASE_TABLET | Freq: Once | ORAL | Status: AC
  Administered 2015-09-03: 20 meq via ORAL
  Filled 2015-09-03: qty 1

## 2015-09-03 MED ORDER — LORAZEPAM 1 MG PO TABS
1.0000 mg | ORAL_TABLET | Freq: Once | ORAL | Status: AC
  Administered 2015-09-03: 1 mg via ORAL
  Filled 2015-09-03: qty 1

## 2015-09-03 NOTE — Care Management Important Message (Signed)
Important Message  Patient Details  Name: Jacqueline Reilly MRN: [id number] Date of Birth: 25-Nov-1923   Medicare Important Message Given:  Yes-second notification given    Nathen May 09/03/2015, 10:10 AM

## 2015-09-03 NOTE — Progress Notes (Signed)
Advanced Heart Failure Rounding Note   Subjective:     Feels good. Wants to go home.    Objective:   Weight Range:  Vital Signs:   Temp:  [97.5 F (36.4 C)-98.6 F (37 C)] 98.2 F (36.8 C) (11/06 0411) Pulse Rate:  [56-72] 60 (11/06 0413) Resp:  [16-25] 24 (11/06 0413) BP: (105-135)/(57-89) 110/62 mmHg (11/06 0413) SpO2:  [93 %-100 %] 100 % (11/06 0413) Weight:  [40.824 kg (90 lb)] 40.824 kg (90 lb) (11/06 0411) Last BM Date: 09/01/15  Weight change: Filed Weights   09/01/15 0413 09/02/15 0605 09/03/15 0411  Weight: 39.917 kg (88 lb) 41.867 kg (92 lb 4.8 oz) 40.824 kg (90 lb)    Intake/Output:   Intake/Output Summary (Last 24 hours) at 09/03/15 0813 Last data filed at 09/03/15 0000  Gross per 24 hour  Intake    546 ml  Output   1800 ml  Net  -1254 ml     Physical Exam: General: Elderly. No resp difficulty HEENT: normal Neck: supple. JVP . Carotids 2+ bilat; no bruits. No lymphadenopathy or thryomegaly appreciated. Cor: PMI laterlally displaced. Regular rate & rhythm. 2/6 MR Lungs: clear Abdomen: soft, nontender, nondistended. No hepatosplenomegaly. No bruits or masses. Good bowel sounds. Extremities: no cyanosis, clubbing, rash, edema Neuro: alert & orientedx3, cranial nerves grossly intact. moves all 4 extremities w/o difficulty. Affect pleasant  Telemetry: NSR  Labs: Basic Metabolic Panel:  Recent Labs Lab 08/31/15 2041 09/01/15 0543 09/02/15 0222 09/03/15 0217  NA 136 138 140 140  K 4.0 3.7 3.6 3.6  CL 97* 102 104 102  CO2 27 28 27 26   GLUCOSE 91 86 87 90  BUN 19 16 14 14   CREATININE 0.98 0.90 0.88 0.88  CALCIUM 9.4 8.7* 8.5* 8.9    Liver Function Tests:  Recent Labs Lab 08/31/15 2041 09/01/15 0543  AST 17 15  ALT 10* 7*  ALKPHOS 84 69  BILITOT 0.3 0.5  PROT 7.3 5.9*  ALBUMIN 3.4* 2.7*   No results for input(s): LIPASE, AMYLASE in the last 168 hours. No results for input(s): AMMONIA in the last 168 hours.  CBC:  Recent  Labs Lab 08/31/15 1842 09/01/15 0543 09/03/15 0217  WBC 13.8* 11.4* 7.8  NEUTROABS  --  9.2*  --   HGB 9.8* 9.2* 10.4*  HCT 30.7* 28.8* 34.3*  MCV 81.9 82.5 82.5  PLT 296 247 299    Cardiac Enzymes: No results for input(s): CKTOTAL, CKMB, CKMBINDEX, TROPONINI in the last 168 hours.  BNP: BNP (last 3 results)  Recent Labs  03/03/15 0740  BNP 249.0*    ProBNP (last 3 results)  Recent Labs  09/04/14 1240  PROBNP 1763.0*      Other results:  Imaging: No results found.   Medications:     Scheduled Medications: . amiodarone  100 mg Oral Daily  . apixaban  2.5 mg Oral BID  . cefTRIAXone (ROCEPHIN)  IV  2 g Intravenous Q24H  . lisinopril  2.5 mg Oral Daily  . multivitamin with minerals  1 tablet Oral Daily  . omega-3 acid ethyl esters  1 g Oral Daily  . potassium chloride SA  20 mEq Oral Daily  . sodium chloride  3 mL Intravenous Q12H  . torsemide  40 mg Oral BID    Infusions: . milrinone 0.125 mcg/kg/min (09/03/15 0626)    PRN Medications: acetaminophen **OR** acetaminophen, guaiFENesin, ondansetron **OR** ondansetron (ZOFRAN) IV   Assessment:   1. Gram-negative bacteremia ([place]) -  enterobacter asburiae  - On ceftriaxone. PICC discontinued as it may be the source.  - senstive to zosyn, ceftriaxone, quinolones and bactrim..  2. Chronic systolic CHF (congestive heart failure) ([place]) on home mirlinone  3. Paroxysmal atrial fibrillation ([facility]) Maintaining NSR on amiodarone 100mg  daily. Eliquis 2.5mg  BID.  Plan/Discussion:    Stable from HF perspective. PICC out. Surveillance cx drawn yesterday. If remain negative at 48 hours. Would replace PICC and she can go home back on milrinone. Appreciate [facility].   Length of Stay: 3   Glori Bickers MD 09/03/2015, 8:13 AM  Advanced Heart Failure Team Pager [phone] (M-F; [place])  Please contact [facility] for night-coverage after hours (4p -7a ) and weekends on [email or web address]

## 2015-09-03 NOTE — Progress Notes (Signed)
Patient had run of wide QRS/SVT.  Melina Copa, PA notified.

## 2015-09-03 NOTE — Discharge Instructions (Signed)

## 2015-09-03 NOTE — Progress Notes (Signed)
[facility] Progress Note   Jacqueline Reilly  [id number]  DOB: 1924/07/04  DOA: 08/31/2015 PCP: Irven Shelling, MD  Brief narrative: Jacqueline Reilly is a 81 y.o. female with chronic systolic CHF on a Milrinone infusion. Blood cultures were drawn as outpt due to leukocytosis and she was found to have gram neg rods in 4/4 blood cultures. She was referred to come to the ER for admission. The patient does not admit to fever or chills. No c/o cough, dysuria or diarrhea. No pain at PICC site and no discharge from around PICC.   Subjective: Jacqueline Reilly has no complaints today. Specifically no dyspnea, chest pain, nausea, vomiting or diarrhea.   Assessment/Plan: Principal Problem: Enterobacter bacteremia/ leukocytosis - UA negative - sensitive to Rocephin which has been started- will need 2 wks of antibiotics- will transition to [place] on discharge  - PICC removed on 11/4 as this is likely the source- obtained peripheral IV to give Milrinone  - f/u on repeat culture results and replace PICC if negative tomorrow   Active Problems:   Chronic systolic CHF (congestive heart failure)  -cont Milrinone, Demadex, Lisinopril- follow daily weights and I and Os  A-fib - cont Amiodarone and Eliquis  Dementia - having sundowning but not severe enough to warrant antipsychotics- will try to avoid if possible due to interaction with Amiodarone in prolonging QT interval   Code Status:     Code Status Orders        Start     Ordered   09/01/15 0403  Limited resuscitation (code)   Continuous    Question Answer Comment  In the event of cardiac or respiratory ARREST: Initiate Code Blue, Call Rapid Response Yes   In the event of cardiac or respiratory ARREST: Perform CPR No   In the event of cardiac or respiratory ARREST: Perform Intubation/Mechanical Ventilation Yes   In the event of cardiac or respiratory ARREST: Use NIPPV/BiPAp only if indicated Yes   In the event of cardiac or  respiratory ARREST: Administer ACLS medications if indicated Yes   In the event of cardiac or respiratory ARREST: Perform Defibrillation or Cardioversion if indicated No      09/01/15 0402     Family Communication: husband Disposition Plan: home when stable DVT prophylaxis: Eliquis Consultants: CHF team Procedures:   Antibiotics: Anti-infectives    Start     Dose/Rate Route Frequency Ordered Stop   09/02/15 0800  cefTRIAXone (ROCEPHIN) 2 g in dextrose 5 % 50 mL IVPB     2 g 100 mL/hr over 30 Minutes Intravenous Every 24 hours 09/02/15 0718     09/02/15 0100  vancomycin (VANCOCIN) 500 mg in sodium chloride 0.9 % 100 mL IVPB  Status:  Discontinued     500 mg 100 mL/hr over 60 Minutes Intravenous Every 24 hours 09/01/15 0418 09/01/15 0804   09/01/15 0800  piperacillin-tazobactam (ZOSYN) IVPB 3.375 g  Status:  Discontinued     3.375 g 12.5 mL/hr over 240 Minutes Intravenous Every 8 hours 09/01/15 0418 09/02/15 0718   08/31/15 2330  vancomycin (VANCOCIN) IVPB 750 mg/150 ml premix     750 mg 150 mL/hr over 60 Minutes Intravenous  Once 08/31/15 2318 09/01/15 0157   08/31/15 2330  piperacillin-tazobactam (ZOSYN) IVPB 3.375 g     3.375 g 100 mL/hr over 30 Minutes Intravenous  Once 08/31/15 2318 09/01/15 0057      Objective: Filed Weights   09/01/15 0413 09/02/15 0605 09/03/15 0411  Weight: 39.917 kg (88  lb) 41.867 kg (92 lb 4.8 oz) 40.824 kg (90 lb)    Intake/Output Summary (Last 24 hours) at 09/03/15 0924 Last data filed at 09/03/15 0834  Gross per 24 hour  Intake    666 ml  Output   1800 ml  Net  -1134 ml     Vitals Filed Vitals:   09/03/15 0300 09/03/15 0411 09/03/15 0413 09/03/15 0831  BP:  110/62 110/62 107/56  Pulse: 56 58 60 71  Temp:  98.2 F (36.8 C)  98.6 F (37 C)  TempSrc:  Oral  Oral  Resp:   24 21  Height:      Weight:  40.824 kg (90 lb)    SpO2: 97% 99% 100% 96%    Exam:  General:  Pt is alert,confused to situation, not in acute distress  HEENT:  No icterus, No thrush, oral mucosa moist  Cardiovascular: regular rate and rhythm, S1/S2 No murmur  Respiratory: clear to auscultation bilaterally   Abdomen: Soft, +Bowel sounds, non tender, non distended, no guarding  MSK: No LE edema, cyanosis or clubbing   Data Reviewed: Basic Metabolic Panel:  Recent Labs Lab 08/31/15 2041 09/01/15 0543 09/02/15 0222 09/03/15 0217  NA 136 138 140 140  K 4.0 3.7 3.6 3.6  CL 97* 102 104 102  CO2 27 28 27 26   GLUCOSE 91 86 87 90  BUN 19 16 14 14   CREATININE 0.98 0.90 0.88 0.88  CALCIUM 9.4 8.7* 8.5* 8.9   Liver Function Tests:  Recent Labs Lab 08/31/15 2041 09/01/15 0543  AST 17 15  ALT 10* 7*  ALKPHOS 84 69  BILITOT 0.3 0.5  PROT 7.3 5.9*  ALBUMIN 3.4* 2.7*   No results for input(s): LIPASE, AMYLASE in the last 168 hours. No results for input(s): AMMONIA in the last 168 hours. CBC:  Recent Labs Lab 08/31/15 1842 09/01/15 0543 09/03/15 0217  WBC 13.8* 11.4* 7.8  NEUTROABS  --  9.2*  --   HGB 9.8* 9.2* 10.4*  HCT 30.7* 28.8* 34.3*  MCV 81.9 82.5 82.5  PLT 296 247 299   Cardiac Enzymes: No results for input(s): CKTOTAL, CKMB, CKMBINDEX, TROPONINI in the last 168 hours. BNP (last 3 results)  Recent Labs  03/03/15 0740  BNP 249.0*    ProBNP (last 3 results)  Recent Labs  09/04/14 1240  PROBNP 1763.0*    CBG:  Recent Labs Lab 09/01/15 1117  GLUCAP 91    Recent Results (from the past 240 hour(s))  Culture, blood (routine x 2)     Status: None (Preliminary result)   Collection Time: 08/31/15 10:20 PM  Result Value Ref Range Status   Specimen Description BLOOD RIGHT ARM  Final   Special Requests BOTTLES DRAWN AEROBIC AND ANAEROBIC 5ML  Final   Culture  Setup Time   Final    GRAM NEGATIVE RODS ANAEROBIC BOTTLE ONLY CRITICAL RESULT CALLED TO, READ BACK BY AND VERIFIED WITH: V CUMMINGS,RN AT 1501 09/01/15 BY L BENFIELD    Culture GRAM NEGATIVE RODS  Final   Report Status PENDING  Incomplete  Urine  culture     Status: None   Collection Time: 08/31/15 11:39 PM  Result Value Ref Range Status   Specimen Description URINE, CLEAN CATCH  Final   Special Requests NONE  Final   Culture MULTIPLE SPECIES PRESENT, SUGGEST RECOLLECTION  Final   Report Status 09/02/2015 FINAL  Final  MRSA PCR Screening     Status: None   Collection Time: 09/01/15  4:03 AM  Result Value Ref Range Status   MRSA by PCR NEGATIVE NEGATIVE Final    Comment:        The GeneXpert MRSA Assay (FDA approved for NASAL specimens only), is one component of a comprehensive MRSA colonization surveillance program. It is not intended to diagnose MRSA infection nor to guide or monitor treatment for MRSA infections.      Studies: No results found.  Scheduled Meds:  Scheduled Meds: . amiodarone  100 mg Oral Daily  . apixaban  2.5 mg Oral BID  . cefTRIAXone (ROCEPHIN)  IV  2 g Intravenous Q24H  . lisinopril  2.5 mg Oral Daily  . multivitamin with minerals  1 tablet Oral Daily  . omega-3 acid ethyl esters  1 g Oral Daily  . potassium chloride SA  20 mEq Oral Daily  . sodium chloride  3 mL Intravenous Q12H  . torsemide  40 mg Oral BID   Continuous Infusions: . milrinone 0.125 mcg/kg/min (09/03/15 0626)    Time spent on care of this patient: 35 min   [place], MD 09/03/2015, 9:24 AM  LOS: 3 days   [facility] Office  [phone] Pager - Text Page per [email or web address] If 7PM-7AM, please contact night-coverage [email or web address]

## 2015-09-04 DIAGNOSIS — D649 Anemia, unspecified: Secondary | ICD-10-CM

## 2015-09-04 LAB — BASIC METABOLIC PANEL
Anion gap: 12 (ref 5–15)
BUN: 11 mg/dL (ref 6–20)
CO2: 26 mmol/L (ref 22–32)
Calcium: 8.8 mg/dL — ABNORMAL LOW (ref 8.9–10.3)
Chloride: 101 mmol/L (ref 101–111)
Creatinine, Ser: 0.9 mg/dL (ref 0.44–1.00)
GFR calc Af Amer: >60 mL/min (ref >60)
GFR calc non Af Amer: 55 mL/min — ABNORMAL LOW (ref >60)
Glucose, Bld: 92 mg/dL (ref 65–99)
Potassium: 3.5 mmol/L (ref 3.5–5.1)
Sodium: 139 mmol/L (ref 135–145)

## 2015-09-04 LAB — CULTURE, BLOOD (ROUTINE X 2)

## 2015-09-04 LAB — MAGNESIUM: Magnesium: 2.2 mg/dL (ref 1.7–2.4)

## 2015-09-04 MED ORDER — GUAIFENESIN 100 MG/5ML PO SOLN: 5.0000 mL | ORAL | Status: DC | PRN

## 2015-09-04 MED ORDER — LEVOFLOXACIN 750 MG PO TABS: 750.0000 mg | ORAL_TABLET | Freq: Every day | ORAL | Status: DC

## 2015-09-04 MED ORDER — GUAIFENESIN 100 MG/5ML PO SYRP
5.0000 mL | ORAL_SOLUTION | ORAL | Status: DC | PRN
  Filled 2015-09-04: qty 5

## 2015-09-04 MED ORDER — SODIUM CHLORIDE 0.9 % IJ SOLN: 10.0000 mL | INTRAMUSCULAR | Status: DC | PRN

## 2015-09-04 MED ORDER — POTASSIUM CHLORIDE CRYS ER 20 MEQ PO TBCR
40.0000 meq | EXTENDED_RELEASE_TABLET | Freq: Once | ORAL | Status: AC
  Administered 2015-09-04: 40 meq via ORAL
  Filled 2015-09-04: qty 2

## 2015-09-04 MED ORDER — POTASSIUM CHLORIDE CRYS ER 20 MEQ PO TBCR
20.0000 meq | EXTENDED_RELEASE_TABLET | Freq: Every day | ORAL | Status: DC
  Administered 2015-09-04: 20 meq via ORAL
  Filled 2015-09-04: qty 1

## 2015-09-04 NOTE — Progress Notes (Signed)
Peripherally Inserted Central Catheter/Midline Placement  The IV Nurse has discussed with the patient and/or persons authorized to consent for the patient, the purpose of this procedure and the potential benefits and risks involved with this procedure.  The benefits include less needle sticks, lab draws from the catheter and patient may be discharged home with the catheter.  Risks include, but not limited to, infection, bleeding, blood clot (thrombus formation), and puncture of an artery; nerve damage and irregular heat beat.  Alternatives to this procedure were also discussed.  PICC/Midline Placement Documentation  PICC / Midline Single Lumen [id number] PICC Right Basilic 39 cm 0 cm (Active)  Indication for Insertion or Continuance of Line Prolonged intravenous therapies;Home intravenous therapies (PICC only) 09/04/2015  5:45 PM  Exposed Catheter (cm) 0 cm 09/04/2015  5:45 PM  Dressing Change Due 09/11/15 09/04/2015  5:45 PM     PICC / Midline Double Lumen [id number] PICC Left Basilic 43 cm 0 cm (Active)       Marianna Payment M 09/04/2015, 6:15 PM

## 2015-09-04 NOTE — Discharge Summary (Signed)
Physician Discharge Summary  Jacqueline Reilly [id number] DOB: 1924/07/18 DOA: 08/31/2015  PCP: Irven Shelling, MD  Admit date: 08/31/2015 Discharge date: 09/04/2015  Time spent: 55 minutes    Discharge Condition: stable    Discharge Diagnoses:  Principal Problem:   Gram-negative bacteremia ([place]) Active Problems:   Chronic systolic CHF (congestive heart failure) (HCC)   Chronic anemia   Persistent atrial fibrillation (HCC)   History of present illness:  Jacqueline Reilly is a 81 y.o. female with chronic systolic CHF on a Milrinone infusion. Blood cultures were drawn as outpt due to leukocytosis and she was found to have gram neg rods in 4/4 blood cultures. She was referred to come to the ER for admission. The patient does not admit to fever or chills. No c/o cough, dysuria or diarrhea. No pain at PICC site and no discharge from around PICC.    Hospital Course:  Principal Problem: Enterobacter bacteremia/ leukocytosis - UA negative - sensitive to Rocephin which has been started- will need 2 wks of antibiotics- will transition to Levaquin on discharge  - PICC removed on 11/4 as this is likely the source- obtained peripheral IV to give Milrinone  - repeat culture negative- replaced PICC today- stable for discharge home   Active Problems:  Chronic systolic CHF (congestive heart failure)  -cont Milrinone, Demadex, Lisinopril- being followed by CHF team  A-fib - cont Amiodarone and Eliquis  Dementia -has been having sundowning but not severe enough to warrant antipsychotics-   Procedures:  PICC line  Consultations:  Heart failure team  Discharge Exam: Filed Weights   09/02/15 0605 09/03/15 0411 09/04/15 0507  Weight: 41.867 kg (92 lb 4.8 oz) 40.824 kg (90 lb) 41.912 kg (92 lb 6.4 oz)   Filed Vitals:   09/04/15 0844  BP: 102/60  Pulse:   Temp:   Resp:     General: AAO x 3, no distress Cardiovascular: RRR, no murmurs  Respiratory: clear to auscultation  bilaterally GI: soft, non-tender, non-distended, bowel sound positive  Discharge Instructions You were cared for by a hospitalist during your hospital stay. If you have any questions about your discharge medications or the care you received while you were in the hospital after you are discharged, you can call the unit and asked to speak with the hospitalist on call if the hospitalist that took care of you is not available. Once you are discharged, your primary care physician will handle any further medical issues. Please note that NO REFILLS for any discharge medications will be authorized once you are discharged, as it is imperative that you return to your primary care physician (or establish a relationship with a primary care physician if you do not have one) for your aftercare needs so that they can reassess your need for medications and monitor your lab values.  Discharge Instructions    Diet - low sodium heart healthy    Complete by:  As directed      Increase activity slowly    Complete by:  As directed             Medication List    TAKE these medications        amiodarone 200 MG tablet  Commonly known as:  PACERONE  Take 100 mg by mouth daily.     ELIQUIS 2.5 MG Tabs tablet  Generic drug:  apixaban  TAKE ONE TABLET BY MOUTH TWICE DAILY     Fish Oil 1000 MG Caps  Take 1 capsule by mouth  daily.     levofloxacin 750 MG tablet  Commonly known as:  LEVAQUIN  Take 1 tablet (750 mg total) by mouth daily.  Start taking on:  09/05/2015     lisinopril 2.5 MG tablet  Commonly known as:  ZESTRIL  Take 1 tablet (2.5 mg total) by mouth daily.     milrinone 20 MG/100ML Soln infusion  Commonly known as:  PRIMACOR  Inject 4.95 mcg/min into the vein continuous. Per [facility]     multivitamin tablet  Take 1 tablet by mouth daily.     potassium chloride SA 20 MEQ tablet  Commonly known as:  KLOR-CON M20  Take 1 tablet (20 mEq total) by mouth daily.     torsemide 20 MG  tablet  Commonly known as:  DEMADEX  TAKE TWO TABLETS BY MOUTH TWICE DAILY       Allergies  Allergen Reactions  . Codeine Nausea Only  . Garlic Nausea Only      The results of significant diagnostics from this hospitalization (including imaging, microbiology, ancillary and laboratory) are listed below for reference.    Significant Diagnostic Studies: Dg Chest 2 View  08/31/2015  CLINICAL DATA:  81 year old female with chronic heart failure and leukocytosis EXAM: CHEST  2 VIEW COMPARISON:  Prior chest x-ray 09/04/2014 FINDINGS: Stable cardiomegaly with left heart enlargement. Atherosclerotic calcifications present throughout the transverse aorta. Left upper extremity dual-lumen PICC with the tip well positioned at the superior cavoatrial junction. Chronic bronchitic change and interstitial prominence. No focal airspace consolidation, pleural effusion or pneumothorax. No evidence of edema. No acute osseous abnormality. IMPRESSION: Stable chest x-ray without evidence of acute cardiopulmonary process. The tip of the left upper extremity PICC is positioned at the superior cavoatrial junction. Electronically Signed   By: Jacqulynn Cadet M.D.   On: 08/31/2015 19:31    Microbiology: Recent Results (from the past 240 hour(s))  Culture, blood (routine x 2)     Status: None   Collection Time: 08/31/15 10:20 PM  Result Value Ref Range Status   Specimen Description BLOOD RIGHT ARM  Final   Special Requests BOTTLES DRAWN AEROBIC AND ANAEROBIC 5ML  Final   Culture  Setup Time   Final    GRAM NEGATIVE RODS IN BOTH AEROBIC AND ANAEROBIC BOTTLES CRITICAL RESULT CALLED TO, READ BACK BY AND VERIFIED WITH: V CUMMINGS,RN AT 1501 09/01/15 BY L BENFIELD    Culture ENTEROBACTER ASBURIAE  Final   Report Status 09/04/2015 FINAL  Final   Organism ID, Bacteria ENTEROBACTER ASBURIAE  Final      Susceptibility   Enterobacter asburiae - MIC*    CEFAZOLIN >=64 RESISTANT Resistant     CEFEPIME <=1 SENSITIVE  Sensitive     CEFTAZIDIME <=1 SENSITIVE Sensitive     CEFTRIAXONE <=1 SENSITIVE Sensitive     CIPROFLOXACIN <=0.25 SENSITIVE Sensitive     GENTAMICIN <=1 SENSITIVE Sensitive     IMIPENEM <=0.25 SENSITIVE Sensitive     TRIMETH/SULFA <=20 SENSITIVE Sensitive     PIP/TAZO <=4 SENSITIVE Sensitive     * ENTEROBACTER ASBURIAE  Culture, blood (routine x 2)     Status: None   Collection Time: 08/31/15 11:06 PM  Result Value Ref Range Status   Specimen Description BLOOD RIGHT ARM  Final   Special Requests BOTTLES DRAWN AEROBIC AND ANAEROBIC 5ML  Final   Culture  Setup Time   Final    GRAM NEGATIVE RODS IN BOTH AEROBIC AND ANAEROBIC BOTTLES CRITICAL RESULT CALLED TO, READ BACK BY  AND VERIFIED WITH: Payton Mccallum RN 16:15 09/01/15 ([email or web address])     Culture   Final    ENTEROBACTER ASBURIAE SUSCEPTIBILITIES PERFORMED ON PREVIOUS CULTURE WITHIN THE LAST 5 DAYS.    Report Status 09/04/2015 FINAL  Final  Urine culture     Status: None   Collection Time: 08/31/15 11:39 PM  Result Value Ref Range Status   Specimen Description URINE, CLEAN CATCH  Final   Special Requests NONE  Final   Culture MULTIPLE SPECIES PRESENT, SUGGEST RECOLLECTION  Final   Report Status 09/02/2015 FINAL  Final  MRSA PCR Screening     Status: None   Collection Time: 09/01/15  4:03 AM  Result Value Ref Range Status   MRSA by PCR NEGATIVE NEGATIVE Final    Comment:        The GeneXpert MRSA Assay (FDA approved for NASAL specimens only), is one component of a comprehensive MRSA colonization surveillance program. It is not intended to diagnose MRSA infection nor to guide or monitor treatment for MRSA infections.   Culture, blood (routine x 2)     Status: None (Preliminary result)   Collection Time: 09/02/15 10:52 AM  Result Value Ref Range Status   Specimen Description BLOOD LEFT HAND  Final   Special Requests IN PEDIATRIC BOTTLE 2CC  Final   Culture NO GROWTH 2 DAYS  Final   Report Status PENDING  Incomplete  Culture,  blood (routine x 2)     Status: None (Preliminary result)   Collection Time: 09/02/15 11:00 AM  Result Value Ref Range Status   Specimen Description BLOOD LEFT ANTECUBITAL  Final   Special Requests BOTTLES DRAWN AEROBIC ONLY [place]  Final   Culture NO GROWTH 2 DAYS  Final   Report Status PENDING  Incomplete     Labs: Basic Metabolic Panel:  Recent Labs Lab 08/31/15 2041 09/01/15 0543 09/02/15 0222 09/03/15 0217 09/04/15 0525  NA 136 138 140 140 139  K 4.0 3.7 3.6 3.6 3.5  CL 97* 102 104 102 101  CO2 27 28 27 26 26   GLUCOSE 91 86 87 90 92  BUN 19 16 14 14 11   CREATININE 0.98 0.90 0.88 0.88 0.90  CALCIUM 9.4 8.7* 8.5* 8.9 8.8*  MG  --   --   --   --  2.2   Liver Function Tests:  Recent Labs Lab 08/31/15 2041 09/01/15 0543  AST 17 15  ALT 10* 7*  ALKPHOS 84 69  BILITOT 0.3 0.5  PROT 7.3 5.9*  ALBUMIN 3.4* 2.7*   No results for input(s): LIPASE, AMYLASE in the last 168 hours. No results for input(s): AMMONIA in the last 168 hours. CBC:  Recent Labs Lab 08/31/15 1842 09/01/15 0543 09/03/15 0217  WBC 13.8* 11.4* 7.8  NEUTROABS  --  9.2*  --   HGB 9.8* 9.2* 10.4*  HCT 30.7* 28.8* 34.3*  MCV 81.9 82.5 82.5  PLT 296 247 299   Cardiac Enzymes: No results for input(s): CKTOTAL, CKMB, CKMBINDEX, TROPONINI in the last 168 hours. BNP: BNP (last 3 results)  Recent Labs  03/03/15 0740  BNP 249.0*    ProBNP (last 3 results) No results for input(s): PROBNP in the last 8760 hours.  CBG:  Recent Labs Lab 09/01/15 1117  GLUCAP 91       SignedDebbe Odea, MD [facility] 09/04/2015, 1:46 PM

## 2015-09-04 NOTE — Progress Notes (Signed)
UR Completed Dee Maday Graves-Bigelow, RN,BSN [phone]  

## 2015-09-04 NOTE — Progress Notes (Signed)
RN paged physical therapy department x3 for PT Work-UP. Pt is discharged, will follow with [facility] for [facility] services. Discussed with [place] representative Pam, about the patient situation. Pt will be discharged home with husband, on IV Milrinone, and [facility] following for services

## 2015-09-04 NOTE — Progress Notes (Signed)
Advanced Heart Failure Rounding Note   Subjective:    No complaints today. Would like to go home, but understands the importance of waiting for blood cx results  Objective:   Weight Range:  Vital Signs:   Temp:  [97.5 F (36.4 C)-98.6 F (37 C)] 98.4 F (36.9 C) (11/07 0507) Pulse Rate:  [66-72] 72 (11/07 0507) Resp:  [19-21] 19 (11/06 1200) BP: (99-129)/(56-64) 114/64 mmHg (11/07 0507) SpO2:  [96 %-100 %] 98 % (11/07 0507) Weight:  [92 lb 6.4 oz (41.912 kg)] 92 lb 6.4 oz (41.912 kg) (11/07 0507) Last BM Date: 09/01/15  Weight change: Filed Weights   09/02/15 0605 09/03/15 0411 09/04/15 0507  Weight: 92 lb 4.8 oz (41.867 kg) 90 lb (40.824 kg) 92 lb 6.4 oz (41.912 kg)    Intake/Output:   Intake/Output Summary (Last 24 hours) at 09/04/15 0824 Last data filed at 09/04/15 0500  Gross per 24 hour  Intake 1303.5 ml  Output    401 ml  Net  902.5 ml     Physical Exam: General: Elderly. No resp difficulty HEENT: normal Neck: supple. JVP . Carotids 2+ bilat; no bruits. No lymphadenopathy or thryomegaly appreciated. Cor: PMI laterlally displaced. RRR 2/6 MR Lungs: CTA Abdomen: soft, NT, ND, no HSM No bruits or masses. +BS Extremities: no cyanosis, clubbing, rash, edema Neuro: alert & orientedx3, cranial nerves grossly intact. moves all 4 extremities w/o difficulty. Affect pleasant  Telemetry: NSR 70s  Labs: Basic Metabolic Panel:  Recent Labs Lab 08/31/15 2041 09/01/15 0543 09/02/15 0222 09/03/15 0217 09/04/15 0525  NA 136 138 140 140 139  K 4.0 3.7 3.6 3.6 3.5  CL 97* 102 104 102 101  CO2 27 28 27 26 26   GLUCOSE 91 86 87 90 92  BUN 19 16 14 14 11   CREATININE 0.98 0.90 0.88 0.88 0.90  CALCIUM 9.4 8.7* 8.5* 8.9 8.8*    Liver Function Tests:  Recent Labs Lab 08/31/15 2041 09/01/15 0543  AST 17 15  ALT 10* 7*  ALKPHOS 84 69  BILITOT 0.3 0.5  PROT 7.3 5.9*  ALBUMIN 3.4* 2.7*   No results for input(s): LIPASE, AMYLASE in the last 168 hours. No  results for input(s): AMMONIA in the last 168 hours.  CBC:  Recent Labs Lab 08/31/15 1842 09/01/15 0543 09/03/15 0217  WBC 13.8* 11.4* 7.8  NEUTROABS  --  9.2*  --   HGB 9.8* 9.2* 10.4*  HCT 30.7* 28.8* 34.3*  MCV 81.9 82.5 82.5  PLT 296 247 299    Cardiac Enzymes: No results for input(s): CKTOTAL, CKMB, CKMBINDEX, TROPONINI in the last 168 hours.  BNP: BNP (last 3 results)  Recent Labs  03/03/15 0740  BNP 249.0*    ProBNP (last 3 results)  Recent Labs  09/04/14 1240  PROBNP 1763.0*      Other results:  Imaging: No results found.   Medications:     Scheduled Medications: . amiodarone  100 mg Oral Daily  . apixaban  2.5 mg Oral BID  . cefTRIAXone (ROCEPHIN)  IV  2 g Intravenous Q24H  . lisinopril  2.5 mg Oral Daily  . multivitamin with minerals  1 tablet Oral Daily  . omega-3 acid ethyl esters  1 g Oral Daily  . potassium chloride SA  20 mEq Oral Daily  . sodium chloride  3 mL Intravenous Q12H  . torsemide  40 mg Oral BID    Infusions: . milrinone 0.125 mcg/kg/min (09/03/15 0626)    PRN Medications:  acetaminophen **OR** acetaminophen, guaiFENesin, ondansetron **OR** ondansetron (ZOFRAN) IV   Assessment:   1. Gram-negative bacteremia ([place]) - enterobacter asburiae  - On ceftriaxone. PICC discontinued as it may be the source.  - senstive to zosyn, ceftriaxone, quinolones and bactrim..  2. Chronic systolic CHF (congestive heart failure) ([place]) on home mirlinone  3. Paroxysmal atrial fibrillation (HCC) Maintaining NSR on amiodarone 100mg  daily. Eliquis 2.5mg  BID.  Plan/Discussion:    Remains stable from HF perspective. PICC removed.   Surveillance cx drawn 09/02/15. If negative x 48 hours, replace PICC and home back on milrinone. Arranged with [facility].  Appreciate [facility].   Length of Stay: 4   Shirley Friar PA-C 09/04/2015, 8:24 AM  Advanced Heart Failure Team Pager [phone] (M-F; 7a - 4p)  Please contact  [facility] for night-coverage after hours (4p -7a ) and weekends on [email or web address]  Patient seen and examined with Oda Kilts, PA-C. We discussed all aspects of the encounter. I agree with the assessment and plan as stated above.   Stable from cardiac perspective. Bcx remain negative. Likely can replace PICC soon and go home back on milrinone. Appreciate [facility].   Bensimhon, Daniel,MD 9:29 AM

## 2015-09-04 NOTE — Progress Notes (Signed)
   09/04/15 1000  Clinical Encounter Type  Visited With Patient  Visit Type Follow-up  Referral From Nurse  Consult/Referral To Chaplain  Spiritual Encounters  Spiritual Needs Emotional;Prayer;Literature  Advance Directives (For Healthcare)  Does patient have an advance directive? No  Would patient like information on creating an advanced directive? Yes - Scientist, clinical (histocompatibility and immunogenetics) given  Per consult, [place] visited with pt dropping off advanced directive materials and information about cremation services; [place] provided spiritual support; pt has some hearing issues.  10:19 AM Jacqueline Reilly

## 2015-09-04 NOTE — Care Management Note (Addendum)
Case Management Note  Patient Details  Name: Jacqueline Reilly MRN: [id number] Date of Birth: 1924/06/01  Subjective/Objective:  CHF                  Action/Plan: NCM spoke to pt. Lives at home with husband. Reports she is independent at home. States her son, Jacqueline Reilly # [id number] lives in [place]. [facility] aware of scheduled dc home today with IV Milrinone. Waiting PICC placement and PT recommendations for home. Attempted call to [facility], left message on voicemail for a return call.    Expected Discharge Date:  09/04/2015               Expected Discharge Plan:  [place]  In-House Referral:     Discharge planning Services  CM Consult  Post Acute Care Choice:  Home Health, Resumption of Svcs/PTA Provider Choice offered to:  Patient, Spouse  DME Arranged:  IV pump/equipment DME Agency:  [place]:  RN [facility] Agency:  [place]  Status of Service:  Completed, signed off  Medicare Important Message Given:  Yes-second notification given Date Medicare IM Given:    Medicare IM give by:    Date Additional Medicare IM Given:    Additional Medicare Important Message give by:     If discussed at [place] of Stay Meetings, dates discussed:    Additional Comments:  Erenest Rasher, RN 09/04/2015, 3:58 PM

## 2015-09-05 ENCOUNTER — Other Ambulatory Visit (HOSPITAL_COMMUNITY): Payer: Self-pay | Admitting: Internal Medicine

## 2015-09-05 DIAGNOSIS — I2510 Atherosclerotic heart disease of native coronary artery without angina pectoris: Secondary | ICD-10-CM | POA: Diagnosis not present

## 2015-09-05 DIAGNOSIS — I110 Hypertensive heart disease with heart failure: Secondary | ICD-10-CM | POA: Diagnosis not present

## 2015-09-05 DIAGNOSIS — D649 Anemia, unspecified: Secondary | ICD-10-CM | POA: Diagnosis not present

## 2015-09-05 DIAGNOSIS — I5022 Chronic systolic (congestive) heart failure: Secondary | ICD-10-CM | POA: Diagnosis not present

## 2015-09-05 DIAGNOSIS — I480 Paroxysmal atrial fibrillation: Secondary | ICD-10-CM | POA: Diagnosis not present

## 2015-09-05 DIAGNOSIS — F0390 Unspecified dementia without behavioral disturbance: Secondary | ICD-10-CM | POA: Diagnosis not present

## 2015-09-07 ENCOUNTER — Emergency Department (HOSPITAL_COMMUNITY)
Admission: EM | Admit: 2015-09-07 | Discharge: 2015-09-07 | Disposition: A | Discharge status: Home / Self Care | Payer: Medicare Other | Attending: Emergency Medicine | Admitting: Emergency Medicine

## 2015-09-07 ENCOUNTER — Encounter (HOSPITAL_COMMUNITY): Payer: Self-pay | Admitting: Emergency Medicine

## 2015-09-07 ENCOUNTER — Telehealth (HOSPITAL_COMMUNITY): Payer: Self-pay

## 2015-09-07 ENCOUNTER — Emergency Department (HOSPITAL_COMMUNITY): Payer: Medicare Other

## 2015-09-07 DIAGNOSIS — Y998 Other external cause status: Secondary | ICD-10-CM | POA: Insufficient documentation

## 2015-09-07 DIAGNOSIS — Z792 Long term (current) use of antibiotics: Secondary | ICD-10-CM | POA: Diagnosis not present

## 2015-09-07 DIAGNOSIS — Y92000 Kitchen of unspecified non-institutional (private) residence as  the place of occurrence of the external cause: Secondary | ICD-10-CM | POA: Insufficient documentation

## 2015-09-07 DIAGNOSIS — S0101XA Laceration without foreign body of scalp, initial encounter: Secondary | ICD-10-CM | POA: Insufficient documentation

## 2015-09-07 DIAGNOSIS — I5022 Chronic systolic (congestive) heart failure: Secondary | ICD-10-CM | POA: Insufficient documentation

## 2015-09-07 DIAGNOSIS — S098XXA Other specified injuries of head, initial encounter: Secondary | ICD-10-CM | POA: Diagnosis not present

## 2015-09-07 DIAGNOSIS — I4891 Unspecified atrial fibrillation: Secondary | ICD-10-CM | POA: Diagnosis not present

## 2015-09-07 DIAGNOSIS — R51 Headache: Secondary | ICD-10-CM | POA: Diagnosis not present

## 2015-09-07 DIAGNOSIS — S0191XA Laceration without foreign body of unspecified part of head, initial encounter: Secondary | ICD-10-CM

## 2015-09-07 DIAGNOSIS — I10 Essential (primary) hypertension: Secondary | ICD-10-CM | POA: Diagnosis not present

## 2015-09-07 DIAGNOSIS — Z7902 Long term (current) use of antithrombotics/antiplatelets: Secondary | ICD-10-CM | POA: Insufficient documentation

## 2015-09-07 DIAGNOSIS — I2510 Atherosclerotic heart disease of native coronary artery without angina pectoris: Secondary | ICD-10-CM | POA: Diagnosis not present

## 2015-09-07 DIAGNOSIS — W01198A Fall on same level from slipping, tripping and stumbling with subsequent striking against other object, initial encounter: Secondary | ICD-10-CM | POA: Insufficient documentation

## 2015-09-07 DIAGNOSIS — S0181XA Laceration without foreign body of other part of head, initial encounter: Secondary | ICD-10-CM | POA: Diagnosis not present

## 2015-09-07 DIAGNOSIS — Z79899 Other long term (current) drug therapy: Secondary | ICD-10-CM | POA: Diagnosis not present

## 2015-09-07 DIAGNOSIS — Y9389 Activity, other specified: Secondary | ICD-10-CM | POA: Diagnosis not present

## 2015-09-07 DIAGNOSIS — S0990XA Unspecified injury of head, initial encounter: Secondary | ICD-10-CM | POA: Diagnosis not present

## 2015-09-07 DIAGNOSIS — W19XXXA Unspecified fall, initial encounter: Secondary | ICD-10-CM

## 2015-09-07 DIAGNOSIS — Z8639 Personal history of other endocrine, nutritional and metabolic disease: Secondary | ICD-10-CM | POA: Diagnosis not present

## 2015-09-07 LAB — CULTURE, BLOOD (ROUTINE X 2)
Culture: NO GROWTH
Culture: NO GROWTH

## 2015-09-07 NOTE — Telephone Encounter (Signed)
Brother, Simmon called and feels that she may be getting too much of a reaction to her antibiotics and think she is getting a little too much He says she isn't really having a physical reaction its more with  her memory;, short term memory is poor  Is taking Levaquin, has taken 4 days left on it and has taken 3. Is it okay to continue the last 4 days.  Michela Pitcher we would call him back and let him know Simmon [phone])

## 2015-09-07 NOTE — ED Notes (Signed)
Patient transported to CT 

## 2015-09-07 NOTE — ED Notes (Signed)
CBG 122 

## 2015-09-07 NOTE — Discharge Instructions (Signed)
Head Injury, Adult °You have a head injury. Headaches and throwing up (vomiting) are common after a head injury. It should be easy to wake up from sleeping. Sometimes you must stay in the hospital. Most problems happen within the first 24 hours. Side effects may occur up to 7-10 days after the injury.  °WHAT ARE THE TYPES OF HEAD INJURIES? °Head injuries can be as minor as a bump. Some head injuries can be more severe. More severe head injuries include: °· A jarring injury to the brain (concussion). °· A bruise of the brain (contusion). This mean there is bleeding in the brain that can cause swelling. °· A cracked skull (skull fracture). °· Bleeding in the brain that collects, clots, and forms a bump (hematoma). °WHEN SHOULD I GET HELP RIGHT AWAY?  °· You are confused or sleepy. °· You cannot be woken up. °· You feel sick to your stomach (nauseous) or keep throwing up (vomiting). °· Your dizziness or unsteadiness is getting worse. °· You have very bad, lasting headaches that are not helped by medicine. Take medicines only as told by your doctor. °· You cannot use your arms or legs like normal. °· You cannot walk. °· You notice changes in the black spots in the center of the colored part of your eye (pupil). °· You have clear or bloody fluid coming from your nose or ears. °· You have trouble seeing. °During the next 24 hours after the injury, you must stay with someone who can watch you. This person should get help right away (call 911 in the [place] if you start to shake and are not able to control it (have seizures), you pass out, or you are unable to wake up. °HOW CAN I PREVENT A HEAD INJURY IN THE FUTURE? °· Wear seat belts. °· Wear a helmet while bike riding and playing sports like football. °· Stay away from dangerous activities around the house. °WHEN CAN I RETURN TO NORMAL ACTIVITIES AND ATHLETICS? °See your doctor before doing these activities. You should not do normal activities or play contact sports until 1  week after the following symptoms have stopped: °· Headache that does not go away. °· Dizziness. °· Poor attention. °· Confusion. °· Memory problems. °· Sickness to your stomach or throwing up. °· Tiredness. °· Fussiness. °· Bothered by bright lights or loud noises. °· Anxiousness or depression. °· Restless sleep. °MAKE SURE YOU:  °· Understand these instructions. °· Will watch your condition. °· Will get help right away if you are not doing well or get worse. °  °This information is not intended to replace advice given to you by your health care provider. Make sure you discuss any questions you have with your health care provider. °  °Document Released: 09/26/2008 Document Revised: 11/04/2014 Document Reviewed: 06/21/2013 °Elsevier Interactive Patient Education ©2016 Else[employer] Inc. ° °

## 2015-09-07 NOTE — ED Provider Notes (Signed)
Medical screening examination/treatment/procedure(s) were conducted as a shared visit with non-physician practitioner(s) and myself.  I personally evaluated the patient during the encounter.  81 year old female who had mechanical fall at home tripped over a rug with a trauma to the right side of her head no loss of consciousnessor other abnormalities. On exam she has no bony tenderness in her upper extremity is no bony tenderness in her lower extremities mental status is appropriate she has a small hemostatic agent to her right head that likely will not repeat require repair. Will get ct and likely dc if normal.    EKG Interpretation   Date/Time:  Thursday September 07 2015 12:41:32 EST Ventricular Rate:  78 PR Interval:    QRS Duration: 133 QT Interval:  403 QTC Calculation: 459 R Axis:   -56 Text Interpretation:  Atrial fibrillation Paired ventricular premature  complexes LVH with IVCD, LAD and secondary repol abnrm No significant  change since last tracing Confirmed by [place] MD, Amanat Hackel [phone]) on  09/07/2015 12:45:51 PM        Merrily Pew, MD 09/08/15 1644

## 2015-09-07 NOTE — ED Notes (Signed)
Pt at home- walking to bedroom, stumbled on carpet and hit head on headboard. Pt has small laceration to right side of head. Bleeding has stopped. Pt did not lose consciousness. Pt is on eliquis. Pt is CHF pt on milrinone. Pt is alert and oriented to date, time, situation. EMS states pt has had some "hallucinations" stating strange things- ex grabbing her cane and saying it was 7 feet tall and too tall to use. Kasandra Knudsen was her normally cane and not 7 feet tall.) BP 103/62, HR 80

## 2015-09-07 NOTE — ED Provider Notes (Signed)
CSN: [id number]     Arrival date & time 09/07/15  1227 History   First MD Initiated Contact with Patient 09/07/15 1229     Chief Complaint  Patient presents with  . Fall   HPI   81 year old female presents today status post fall. Patient reports she was walking in her kitchen today when she stumbled falling and twisting around and hitting her head on the headboard. Patient denies any loss of consciousness, reports a small laceration to the side of the head that is now since stopped bleeding. Patient denies a neck pain, shoulder or arm hip back abdominal or chest pain. Patient reports this was a mechanical fall, she denies dizziness or lightheadedness, chest pain, or any other concerning signs or symptoms today. Patient denies headache, although she does have some tenderness to the right scalp. Husband is present at the time of evaluation reporting the patient did have a mechanical fall, and is at her baseline. He does report that since a heart attack several months ago she has not been "herself" but reports today's presentation is baseline. Patient does report she is on blood thinners.  Past Medical History  Diagnosis Date  . PVD (peripheral vascular disease) ([place])   . Ventricular dysfunction     left; ischemic  . Hyperlipidemia   . AAA (abdominal aortic aneurysm) ([place])   . Renal artery stenosis ([place])   . Hypertension   . Coronary artery disease 2007    moderate ASCAD of the left system s/p PCI of the D2 and PCI of the left circ 03/2009  . Chronic systolic heart failure ([place])     a. ECHO (04/2014) EF 20-25%, diff HK, mild MR  . Atrial fibrillation ([place])   . CHF (congestive heart failure) [facility])    Past Surgical History  Procedure Laterality Date  . Retinal cryopexy      right eye (for retinal detachment)  . Angioplasty    . Coronary stent placement     Family History  Problem Relation Age of Onset  . Heart disease Mother   . Heart disease Father   . Heart disease Brother     x 3    Social History  Substance Use Topics  . Smoking status: Never Smoker   . Smokeless tobacco: Never Used  . Alcohol Use: Yes     Comment: occasionally   OB History    No data available     Review of Systems  All other systems reviewed and are negative.     Allergies  Codeine and Garlic  Home Medications   Prior to Admission medications   Medication Sig Start Date End Date Taking? Authorizing Provider  amiodarone (PACERONE) 200 MG tablet Take 100 mg by mouth daily.    Historical Provider, MD  ELIQUIS 2.5 MG TABS tablet TAKE ONE TABLET BY MOUTH TWICE DAILY 08/08/15   Larey Dresser, MD  levofloxacin (LEVAQUIN) 750 MG tablet Take 1 tablet (750 mg total) by mouth daily. 09/05/15   Debbe Odea, MD  lisinopril (ZESTRIL) 2.5 MG tablet Take 1 tablet (2.5 mg total) by mouth daily. 11/23/14   Jolaine Artist, MD  milrinone [facility]) 20 MG/100ML SOLN infusion Inject 4.95 mcg/min into the vein continuous. Per [facility] 05/20/14   Evelene Croon Barrett, PA-C  Multiple Vitamin (MULTIVITAMIN) tablet Take 1 tablet by mouth daily. 07/26/14   Larey Dresser, MD  Omega-3 Fatty Acids (FISH OIL) 1000 MG CAPS Take 1 capsule by mouth daily.  Historical Provider, MD  potassium chloride SA (KLOR-CON M20) 20 MEQ tablet Take 1 tablet (20 mEq total) by mouth daily. 07/10/15   Jolaine Artist, MD  torsemide (DEMADEX) 20 MG tablet TAKE TWO TABLETS BY MOUTH TWICE DAILY 07/20/15   Shaune Pascal Bensimhon, MD   BP 110/57 mmHg  Pulse 74  Temp(Src) 97.8 F (36.6 C) (Oral)  Resp 17  Ht 4\' 6"  (1.372 m)  Wt 85 lb (38.556 kg)  BMI 20.48 kg/m2  SpO2 99%   Physical Exam  Constitutional: She is oriented to person, place, and time. She appears well-developed and well-nourished. No distress.  HENT:  Head: Normocephalic and atraumatic.  1 cm laceration to the right scalp  Eyes: Conjunctivae are normal. Pupils are equal, round, and reactive to light. Right eye exhibits no discharge. Left eye exhibits no  discharge. No scleral icterus.  Neck: Normal range of motion. Neck supple. No JVD present. No tracheal deviation present.  Cardiovascular: Normal rate and regular rhythm.   Pulmonary/Chest: Effort normal and breath sounds normal. No stridor. No respiratory distress. She has no wheezes. She has no rales. She exhibits no tenderness.  Abdominal: Soft. There is no tenderness.  Musculoskeletal: Normal range of motion. She exhibits tenderness. She exhibits no edema.  No C, T, or L spine tenderness to palpation. No obvious signs of trauma, deformity, infection, step-offs. Lung expansion normal. No scoliosis or kyphosis. Bilateral lower extremity strength 5 out of 5, sensation grossly intact, , pedal pulses 2+, Refill less than 3 seconds.    Neurological: She is alert and oriented to person, place, and time. Coordination normal.  Skin: Skin is warm and dry. She is not diaphoretic.  Psychiatric: She has a normal mood and affect. Her behavior is normal. Judgment and thought content normal.  Nursing note and vitals reviewed.   ED Course  Procedures (including critical care time)  LACERATION REPAIR Performed by: Elmer Ramp Authorized by: Elmer Ramp Consent: Verbal consent obtained. Risks and benefits: risks, benefits and alternatives were discussed Consent given by: patient Patient identity confirmed: provided demographic data Prepped and Draped in normal sterile fashion Wound explored  Laceration Location: Right scalp  Laceration Length: 1 cm  No Foreign Bodies seen or palpated  Anesthesia: local infiltration  Local anesthetic:  Anesthetic total: 0 ml  Irrigation method: syringe Amount of cleaning: standard  Skin closure: Simple   Number of staple 1   Technique: Staple   Patient tolerance: Patient tolerated the procedure well with no immediate complications. Labs Review Labs Reviewed - No data to display  Imaging Review Ct Head Wo Contrast  09/07/2015   CLINICAL DATA:  Pain following fall EXAM: CT HEAD WITHOUT CONTRAST TECHNIQUE: Contiguous axial images were obtained from the base of the skull through the vertex without intravenous contrast. COMPARISON:  Mar 03, 2015 FINDINGS: There is moderate generalized atrophy with somewhat greater atrophy in the cerebellum compared to other regions, stable. There is no intracranial mass, hemorrhage, extra-axial fluid collection, or midline shift. There is patchy small vessel disease in the centra semiovale bilaterally, stable. There is evidence of a prior small lacunar infarct in the anterior limb of the right internal capsule, stable. Elsewhere gray-white compartments appear normal. There is no acute appearing infarct. Bony calvarium appears intact. The mastoid air cells are clear. Patient is status post scleral banding procedure in the right globe. IMPRESSION: Stable atrophy with periventricular small vessel disease. Prior small infarct in the anterior limb the right internal capsule. No intracranial mass, hemorrhage, or  extra-axial fluid collection. No demonstrable acute appearing infarct. Postoperative scleral banding on the right. Electronically Signed   By: Lowella Grip III M.D.   On: 09/07/2015 13:53   I have personally reviewed and evaluated these images and lab results as part of my medical decision-making.   EKG Interpretation   Date/Time:  Thursday September 07 2015 12:41:32 EST Ventricular Rate:  78 PR Interval:    QRS Duration: 133 QT Interval:  403 QTC Calculation: 459 R Axis:   -56 Text Interpretation:  Atrial fibrillation Paired ventricular premature  complexes LVH with IVCD, LAD and secondary repol abnrm No significant  change since last tracing Confirmed by [facility] MD, Corene Cornea [phone]) on  09/07/2015 12:45:51 PM      MDM   Final diagnoses:  Fall, initial encounter  Laceration of head, initial encounter    Labs:  Imaging: CT head without contrast no acute  findings  Consults:  Therapeutics:  Discharge Meds:   Assessment/Plan: Patient presents status post mechanical fall, no loss of consciousness, no headache, no significant findings on exam. Patient had a small superficial laceration that required one staple. Patient at her baseline with no significant findings, she will be discharged home with her friend and her husband she is given strict return precautions, she verbalizes understanding and agreement for today's plan, should her follow-up evaluation with primary care for staple removal        Okey Regal, PA-C 09/07/15 1716  Merrily Pew, MD 09/08/15 1644

## 2015-09-08 ENCOUNTER — Other Ambulatory Visit (HOSPITAL_COMMUNITY): Payer: Self-pay | Admitting: Internal Medicine

## 2015-09-08 DIAGNOSIS — I2510 Atherosclerotic heart disease of native coronary artery without angina pectoris: Secondary | ICD-10-CM | POA: Diagnosis not present

## 2015-09-08 DIAGNOSIS — D649 Anemia, unspecified: Secondary | ICD-10-CM | POA: Diagnosis not present

## 2015-09-08 DIAGNOSIS — I110 Hypertensive heart disease with heart failure: Secondary | ICD-10-CM | POA: Diagnosis not present

## 2015-09-08 DIAGNOSIS — I480 Paroxysmal atrial fibrillation: Secondary | ICD-10-CM | POA: Diagnosis not present

## 2015-09-08 DIAGNOSIS — I5022 Chronic systolic (congestive) heart failure: Secondary | ICD-10-CM | POA: Diagnosis not present

## 2015-09-08 DIAGNOSIS — F0390 Unspecified dementia without behavioral disturbance: Secondary | ICD-10-CM | POA: Diagnosis not present

## 2015-09-11 DIAGNOSIS — I5043 Acute on chronic combined systolic (congestive) and diastolic (congestive) heart failure: Secondary | ICD-10-CM | POA: Diagnosis not present

## 2015-09-11 DIAGNOSIS — I110 Hypertensive heart disease with heart failure: Secondary | ICD-10-CM | POA: Diagnosis not present

## 2015-09-11 DIAGNOSIS — I2510 Atherosclerotic heart disease of native coronary artery without angina pectoris: Secondary | ICD-10-CM | POA: Diagnosis not present

## 2015-09-11 DIAGNOSIS — I5022 Chronic systolic (congestive) heart failure: Secondary | ICD-10-CM | POA: Diagnosis not present

## 2015-09-11 DIAGNOSIS — F0390 Unspecified dementia without behavioral disturbance: Secondary | ICD-10-CM | POA: Diagnosis not present

## 2015-09-11 DIAGNOSIS — D649 Anemia, unspecified: Secondary | ICD-10-CM | POA: Diagnosis not present

## 2015-09-11 DIAGNOSIS — I480 Paroxysmal atrial fibrillation: Secondary | ICD-10-CM | POA: Diagnosis not present

## 2015-09-15 ENCOUNTER — Other Ambulatory Visit: Payer: Self-pay | Admitting: *Deleted

## 2015-09-15 DIAGNOSIS — D649 Anemia, unspecified: Secondary | ICD-10-CM | POA: Diagnosis not present

## 2015-09-15 DIAGNOSIS — I5022 Chronic systolic (congestive) heart failure: Secondary | ICD-10-CM | POA: Diagnosis not present

## 2015-09-15 DIAGNOSIS — I480 Paroxysmal atrial fibrillation: Secondary | ICD-10-CM | POA: Diagnosis not present

## 2015-09-15 DIAGNOSIS — I110 Hypertensive heart disease with heart failure: Secondary | ICD-10-CM | POA: Diagnosis not present

## 2015-09-15 DIAGNOSIS — F0390 Unspecified dementia without behavioral disturbance: Secondary | ICD-10-CM | POA: Diagnosis not present

## 2015-09-15 DIAGNOSIS — I2510 Atherosclerotic heart disease of native coronary artery without angina pectoris: Secondary | ICD-10-CM | POA: Diagnosis not present

## 2015-09-15 NOTE — Patient Outreach (Signed)
Initial [facility]N Screening call - I initially spoke with [name], but her husband quickly intervened to speak with me about the "emergency heart failure situation" they are dealing with. I told him I had a referral from Dr. Laurann Montana and that HF is one of my areas of expertise. I asked if I could speak to Jacqueline Reilly to assess her condition but he told he they have an appt with Dr. Laurann Montana on Tuesday to evaluate her situation and he will talk to him about having [facility] or myself involved with her care.  Jacqueline Reilly is very concerned about his wife and very protective.  I will call them next Wednesday to see if he will allow me to set up an appt with them.  Jacqueline Reilly [phone]

## 2015-09-19 DIAGNOSIS — I5020 Unspecified systolic (congestive) heart failure: Secondary | ICD-10-CM | POA: Diagnosis not present

## 2015-09-19 DIAGNOSIS — R7881 Bacteremia: Secondary | ICD-10-CM | POA: Diagnosis not present

## 2015-09-19 DIAGNOSIS — Z23 Encounter for immunization: Secondary | ICD-10-CM | POA: Diagnosis not present

## 2015-09-19 DIAGNOSIS — E46 Unspecified protein-calorie malnutrition: Secondary | ICD-10-CM | POA: Diagnosis not present

## 2015-09-19 DIAGNOSIS — Z1389 Encounter for screening for other disorder: Secondary | ICD-10-CM | POA: Diagnosis not present

## 2015-09-20 DIAGNOSIS — I480 Paroxysmal atrial fibrillation: Secondary | ICD-10-CM | POA: Diagnosis not present

## 2015-09-20 DIAGNOSIS — I2510 Atherosclerotic heart disease of native coronary artery without angina pectoris: Secondary | ICD-10-CM | POA: Diagnosis not present

## 2015-09-20 DIAGNOSIS — I5022 Chronic systolic (congestive) heart failure: Secondary | ICD-10-CM | POA: Diagnosis not present

## 2015-09-20 DIAGNOSIS — I110 Hypertensive heart disease with heart failure: Secondary | ICD-10-CM | POA: Diagnosis not present

## 2015-09-20 DIAGNOSIS — F0390 Unspecified dementia without behavioral disturbance: Secondary | ICD-10-CM | POA: Diagnosis not present

## 2015-09-20 DIAGNOSIS — D649 Anemia, unspecified: Secondary | ICD-10-CM | POA: Diagnosis not present

## 2015-09-26 ENCOUNTER — Other Ambulatory Visit: Payer: Self-pay | Admitting: *Deleted

## 2015-09-27 ENCOUNTER — Encounter: Payer: Self-pay | Admitting: *Deleted

## 2015-09-27 ENCOUNTER — Other Ambulatory Visit: Payer: Self-pay | Admitting: *Deleted

## 2015-09-27 DIAGNOSIS — I2510 Atherosclerotic heart disease of native coronary artery without angina pectoris: Secondary | ICD-10-CM | POA: Diagnosis not present

## 2015-09-27 DIAGNOSIS — I110 Hypertensive heart disease with heart failure: Secondary | ICD-10-CM | POA: Diagnosis not present

## 2015-09-27 DIAGNOSIS — I5022 Chronic systolic (congestive) heart failure: Secondary | ICD-10-CM | POA: Diagnosis not present

## 2015-09-27 DIAGNOSIS — F0390 Unspecified dementia without behavioral disturbance: Secondary | ICD-10-CM | POA: Diagnosis not present

## 2015-09-27 DIAGNOSIS — D649 Anemia, unspecified: Secondary | ICD-10-CM | POA: Diagnosis not present

## 2015-09-27 DIAGNOSIS — I480 Paroxysmal atrial fibrillation: Secondary | ICD-10-CM | POA: Diagnosis not present

## 2015-09-27 NOTE — Patient Outreach (Signed)
S: Pt is HIGH RISK for admissions related to [place] III HF, Falls and general frailty. I was able to visit with Jacqueline Reilly today. She needed constant reassurance and explanation of why Dr.Griffin had referred her. I explained that he is concerned about her weight loss and want to make sure she is taking her medications correctly. I also told her that we work very closely with people who have had multiple trips to the ED or have had admissions. She did not remember that she had been in the hospital recently.  Her [facility] nurse came during my visit and provided her PICC line care and changed her milrinone IV bag for the week.   Mr. Stertz took the opportunity of my visit to go do some errands.   O: BP 94/60 mmHg  Pulse 71  Resp 18  Ht 1.524 m (5')  Wt 83 lb 6.4 oz (37.83 kg)  BMI 16.29 kg/m2  SpO2 96%       RRR, murmur, lungs clear and no edema.      Pt exhibits an air of suspicion and is somewhat condescending towards myself and the other nurse. There is conflict            Exhibited between Mr.and Mrs [place]. Mr. Abdelfattah is concerned and his actions show care but also he is somewhat            controlling. Mrs. Living acts imposed upon and dismisses his interjections during our meeting. She is resentful and            impatient towards him.      I asked to see pt medications, she was not able to show me any medication bottles and the medication box on the       counter is empty.  Medications Reviewed Today    Reviewed by [name], NP (Nurse Practitioner) on 09/27/15 at 1122  Med List Status: <None>   Medication Order Taking? Sig Documenting Provider Last Dose Status Informant   amiodarone (PACERONE) 200 MG tablet [id number] Yes Take 100 mg by mouth daily. Historical Provider, MD Taking Active Family Member   ELIQUIS 2.5 MG TABS tablet [id number] Yes TAKE ONE TABLET BY MOUTH TWICE DAILY Larey Dresser, MD Taking Active Family Member         Discontinued 09/27/15 1122 (Error)    lisinopril (ZESTRIL) 2.5 MG tablet [id number] Yes Take 1 tablet (2.5 mg total) by mouth daily. Jolaine Artist, MD Taking Active Family Member   milrinone [facility]) 20 MG/100ML SOLN infusion [id number] Yes Inject 4.95 mcg/min into the vein continuous. Per [place], PA-C Taking Active Family Member             Med Note Wyatt Portela, Sherrilyn Rist   Fri Mar 03, 2015  7:45 AM): Continuous--[facility] comes in and checks/changes (if needed) every Friday.     Multiple Vitamin (MULTIVITAMIN) tablet [id number] Yes Take 1 tablet by mouth daily. Larey Dresser, MD Taking Active Family Member   Omega-3 Fatty Acids (FISH OIL) 1000 MG CAPS [id number] Yes Take 1 capsule by mouth daily. Historical Provider, MD Taking Active Family Member   potassium chloride SA (KLOR-CON M20) 20 MEQ tablet [id number] Yes Take 1 tablet (20 mEq total) by mouth daily. Jolaine Artist, MD Taking Active Family Member   torsemide [facility]) 20 MG tablet [id number] Yes TAKE TWO TABLETS BY MOUTH TWICE DAILY Jolaine Artist, MD Taking Active Family Member  A:  Extreme frailty      HF - [place] III      Weight Loss      High Risk for Falls  P:  I will see Jacqueline Reilly in 2 weeks. I need to plan on coming any day but Wednesday to avoid double booking with        Jacqueline Gula, RN, who is providing her weekly PICC line care and IV medication.   I will call pt's brother to talk about pt medication compliance.  Encouraged pt and wife to call me with any concerns.  THN CM Care Plan Problem One        Most Recent Value   Care Plan Problem One  HF   Role Documenting the Problem One  Care Management Coordinator   Care Plan for Problem One  Active   THN Long Term Goal (31-90 days)  Pt will not admit to hospital in the next 90 days for HF.   THN Long Term Goal Start Date  09/27/15   Interventions for Problem One Long Term Goal  Reviewed HF self management and requested pt weigh daily and record along with  taking meds, no salt diet already followind.    THN CM Care Plan Problem Two        Most Recent Value   Care Plan Problem Two  Low wieght   Role Documenting the Problem Two  Care Management Coordinator   Care Plan for Problem Two  Active   THN CM Short Term Goal #1 (0-30 days)  Pt increase caloric intake and her weight will be maintained at 83 over the next 30 days.   THN CM Short Term Goal #1 Start Date  09/27/15   Interventions for Short Term Goal #2   Suggested pt try [employer] Instant Breakfast and egg nog this month to increase nutritional intake, weight.    THN CM Care Plan Problem Three        Most Recent Value   Care Plan Problem Three  HIgh risk for falls.   Role Documenting the Problem Three  Care Management Coordinator   Care Plan for Problem Three  Active   THN CM Short Term Goal #1 (0-30 days)  Pt will use safety precautions and walker or can to avoid fall injury over the next 30 days.   [facility] CM Short Term Goal #1 Start Date  09/27/15     [name] [facility] [employer] [phone]

## 2015-09-28 ENCOUNTER — Other Ambulatory Visit: Payer: Self-pay | Admitting: *Deleted

## 2015-09-28 ENCOUNTER — Telehealth (HOSPITAL_COMMUNITY): Payer: Self-pay

## 2015-09-28 ENCOUNTER — Encounter: Payer: Self-pay | Admitting: *Deleted

## 2015-09-28 NOTE — Patient Outreach (Signed)
Telephone screen and request for permission for [facility] home visit per Dr. Laurann Montana who has expressed concern regarding pt weight loss and medication compliance.  I was able to speak with [name] and make an appt for an initial home visit on 11/30.  [name] [facility] [place] [phone]

## 2015-09-28 NOTE — Patient Outreach (Signed)
Telephone call to pt's brother, Fara Chute, to discuss medication administration and compliance. I have clearance to speak with him per [name].  Mr. Paulina Fusi reports he had pt's medication bottles and he fills her med box and delivers a filled medbox for the week on Thursdays. I advised him that I did not see any medication in the med boxes yesterday.  I advised him of my role and our goals that we have set. He voices his appreciation for my involvement. I told him I would visit next Tuesday and check on her med box to ensure she has them and that it appears she is taking them on schedule. We double checked her medication list and we have the same.   I also told him of our concern with her wt loss and need to consume enough calories to maintain her weight. He tells me she is a strict vegetarian. I requested that he encourage the [employer] and eggnog.  We agreed to stay in contact for any concerns.  [name] [facility] [place] [phone]

## 2015-09-28 NOTE — Telephone Encounter (Signed)
Returned call.  Line is busy x2 will call back

## 2015-10-02 ENCOUNTER — Encounter: Payer: Self-pay | Admitting: Internal Medicine

## 2015-10-02 ENCOUNTER — Ambulatory Visit: Payer: Self-pay | Admitting: *Deleted

## 2015-10-04 ENCOUNTER — Ambulatory Visit (HOSPITAL_COMMUNITY)
Admission: RE | Admit: 2015-10-04 | Discharge: 2015-10-04 | Disposition: A | Discharge status: Home / Self Care | Payer: Medicare Other | Source: Ambulatory Visit | Attending: Internal Medicine | Admitting: Internal Medicine

## 2015-10-04 ENCOUNTER — Encounter (HOSPITAL_COMMUNITY): Payer: Self-pay | Admitting: Internal Medicine

## 2015-10-04 VITALS — BP 94/52 | HR 76 | Wt 87.5 lb

## 2015-10-04 DIAGNOSIS — F0390 Unspecified dementia without behavioral disturbance: Secondary | ICD-10-CM | POA: Insufficient documentation

## 2015-10-04 DIAGNOSIS — I429 Cardiomyopathy, unspecified: Secondary | ICD-10-CM | POA: Insufficient documentation

## 2015-10-04 DIAGNOSIS — I4891 Unspecified atrial fibrillation: Secondary | ICD-10-CM | POA: Diagnosis not present

## 2015-10-04 DIAGNOSIS — I5022 Chronic systolic (congestive) heart failure: Secondary | ICD-10-CM | POA: Diagnosis not present

## 2015-10-04 DIAGNOSIS — I701 Atherosclerosis of renal artery: Secondary | ICD-10-CM | POA: Diagnosis not present

## 2015-10-04 DIAGNOSIS — I5189 Other ill-defined heart diseases: Secondary | ICD-10-CM | POA: Diagnosis not present

## 2015-10-04 DIAGNOSIS — I714 Abdominal aortic aneurysm, without rupture: Secondary | ICD-10-CM | POA: Diagnosis not present

## 2015-10-04 DIAGNOSIS — D649 Anemia, unspecified: Secondary | ICD-10-CM | POA: Insufficient documentation

## 2015-10-04 DIAGNOSIS — I480 Paroxysmal atrial fibrillation: Secondary | ICD-10-CM

## 2015-10-04 DIAGNOSIS — Z9861 Coronary angioplasty status: Secondary | ICD-10-CM | POA: Diagnosis not present

## 2015-10-04 DIAGNOSIS — I10 Essential (primary) hypertension: Secondary | ICD-10-CM | POA: Insufficient documentation

## 2015-10-04 DIAGNOSIS — I472 Ventricular tachycardia: Secondary | ICD-10-CM | POA: Insufficient documentation

## 2015-10-04 DIAGNOSIS — R7881 Bacteremia: Secondary | ICD-10-CM

## 2015-10-04 DIAGNOSIS — I2510 Atherosclerotic heart disease of native coronary artery without angina pectoris: Secondary | ICD-10-CM | POA: Insufficient documentation

## 2015-10-04 DIAGNOSIS — E785 Hyperlipidemia, unspecified: Secondary | ICD-10-CM | POA: Insufficient documentation

## 2015-10-04 DIAGNOSIS — Z7901 Long term (current) use of anticoagulants: Secondary | ICD-10-CM | POA: Diagnosis not present

## 2015-10-04 DIAGNOSIS — Z79899 Other long term (current) drug therapy: Secondary | ICD-10-CM | POA: Diagnosis not present

## 2015-10-04 DIAGNOSIS — I739 Peripheral vascular disease, unspecified: Secondary | ICD-10-CM | POA: Insufficient documentation

## 2015-10-04 DIAGNOSIS — I110 Hypertensive heart disease with heart failure: Secondary | ICD-10-CM | POA: Diagnosis not present

## 2015-10-04 NOTE — Progress Notes (Signed)
ADVANCED HF CLINIC NOTE  Patient ID: Jacqueline Reilly, female   DOB: [birth year], 81 y.o.   MRN: [id number] PCP: [name] CHF: Bensimhon  HPI: Jacqueline Reilly) is an 81 y/o woman with CAD, AAA, systolic HF, mild dementia, afib and CAD. She has had two previous coronary interventions including a cutting balloon to her D2 in 2007 and a DES to her mid LCX in 2010. Her LV dysfunction has been out of proportion to her CAD.  Last echo in 7/15 showed EF 20-25% with severe LV dilation, mild MR, and PA systolic pressure 38 mmHg.   Admitted 7/11-7/24/15 with syncope and dyspnea. She had new onset A-fib/RVR. Troponin was 1.03> 1.59> 2.18 . She was placed on amiodarone and heparin drip. She developed respiratory distress and required intubation. She converted to NSR but later was bradycardiac with NSVT. Amiodarone temporarily stopped but restarted after hyperkalemia resolved. Hyperkalemia was thought to be contributing to bradycardia. Diuresed with IV lasix. Co-ox 29% and started on milrinone which we tried to wean off but she did not tolerate. Discharge weight 87 lbs.   Admitted in November for Gram-negative bacteremia ([place]) - enterobacter asburiae from PICC. Treated with ceftriaxone and then switched to oral levofloxacin for a total of 21 weeks and PICC replaced.   She returns today for HF follow up with her brother. She is doing well. Denies any fevers, chills or problems with PICC line. She walks with a cane. Not SOB when walking around the house. No edema, orthopnea or PND. Weight is stable at 87. Husband and brother give her her medications.   Echo 8/16  EF 25% with diffuse hypokinesis, mild AS/mild AI, RV mildly dilated with moderately decreased systolic function.   Labs: (05/23/14) K+ 4.3, creatinine 1.06, digoxin 1.1 (8/15) K 4.7, creatinine 0.94, HCT 33.2 (9/15) K 3.4, creatinine .80 (10/1/0/2015) K 5.0 Creatinine 0.87 Magnesium 2.4  08/11/14 K 5.3 Creatinine 0.8 08/25/14 K 4.4 Cr  0.85 09/04/14   K 4.0 Cr 0.8 pBNP 1763 11/02/14  K 4.2 Cr 0.85 3/16 HCT 32.6, K 4.2, creatinine 0.82 03/03/15  K 3.1 Cr 0.99 BNP 249 8/16 HCT 29.4, K 4.6, creatinine 0.93 07/26/15 Hgb 9.7 K 4.6 creatinine 0.86  ROS: All systems negative except as listed in HPI, PMH and Problem List.  SH:  Social History   Social History  . Marital Status: Married    Spouse Name: N/A  . Number of Children: 1  . Years of Education: N/A   Occupational History  . retired    Social History Main Topics  . Smoking status: Never Smoker   . Smokeless tobacco: Never Used  . Alcohol Use: Yes     Comment: occasionally  . Drug Use: No  . Sexual Activity: Not on file   Other Topics Concern  . Not on file   Social History Narrative   She lives in [place], family helps with her care.    FH:  Family History  Problem Relation Age of Onset  . Heart disease Mother   . Heart disease Father   . Heart disease Brother     x 3    Past Medical History  Diagnosis Date  . PVD (peripheral vascular disease) ([place])   . Ventricular dysfunction     left; ischemic  . Hyperlipidemia   . AAA (abdominal aortic aneurysm) ([place])   . Renal artery stenosis ([place])   . Hypertension   . Coronary artery disease 2007    moderate ASCAD of  the left system s/p PCI of the D2 and PCI of the left circ 03/2009  . Chronic systolic heart failure ([place])     a. ECHO (04/2014) EF 20-25%, diff HK, mild MR  . Atrial fibrillation ([place])   . CHF (congestive heart failure) ([place])     Current Outpatient Prescriptions  Medication Sig Dispense Refill  . amiodarone (PACERONE) 200 MG tablet Take 100 mg by mouth daily.    Marland Kitchen ELIQUIS 2.5 MG TABS tablet TAKE ONE TABLET BY MOUTH TWICE DAILY 60 tablet 3  . lisinopril (ZESTRIL) 2.5 MG tablet Take 1 tablet (2.5 mg total) by mouth daily. 90 tablet 3  . milrinone (PRIMACOR) 20 MG/100ML SOLN infusion Inject 4.95 mcg/min into the vein continuous. Per [facility] 100 mL 3  . Multiple Vitamin  (MULTIVITAMIN) tablet Take 1 tablet by mouth daily. 30 tablet 1  . Omega-3 Fatty Acids (FISH OIL) 1000 MG CAPS Take 1 capsule by mouth daily.    . potassium chloride SA (KLOR-CON M20) 20 MEQ tablet Take 1 tablet (20 mEq total) by mouth daily. 90 tablet 3  . torsemide (DEMADEX) 20 MG tablet TAKE TWO TABLETS BY MOUTH TWICE DAILY 120 tablet 3   No current facility-administered medications for this encounter.    Filed Vitals:   10/04/15 1400  BP: 94/52  Pulse: 76  Weight: 87 lb 8 oz (39.69 kg)  SpO2: 96%    PHYSICAL EXAM:  General:  Elderly appearing. Frail, No resp difficulty; brother present. Ambulated in the clinic with cane HEENT: normal Neck: supple. JVP 7. Carotids 2+ bilaterally; no bruits. No lymphadenopathy or thryomegaly appreciated. Cor: PMI normal. Regular rate & rhythm. No rubs, gallops.  2/6 SEM RUSB with clear S2. Lungs: clear Abdomen: soft, nontender, nondistended. No hepatosplenomegaly. No bruits or masses. Good bowel sounds. Extremities: no cyanosis, clubbing, rash, edema LUE PICC site clear Neuro: alert & orientedx3, cranial nerves grossly intact. Moves all 4 extremities w/o difficulty. Affect pleasant.  ASSESSMENT & PLAN:   1) Chronic Systolic Heart Failure: Mixed ischemic/nonischemic cardiomyopathy (degree of LV dysfunction is out of proportion to CAD), EF 25% with moderate RV systolic dysfunction on echo today.  Stable NYHA II-III symptoms and volume status stable on milrinone. - Continue current milrinone gtt. - Continue torsemide 40 mg bid.  - Continue lisinopril 2.5 mg daily.  - Not on beta blocker with low output.  - Overall doing quite well despite the challenges her home situation presents.  2) Atrial fibrillation: Paroxysmal.  Now in NSR on amiodarone.  Continue apixaban 2.5 mg bid (weight less than 60 kg and age >69). Continue amiodarone 100 mg daily.   3) Limited Code: No CPR or defibrillation 4) CAD: No chest pain. Off ASA with Eliquis.  5) Anemia:  Hemoglobin now stable 6) Recent PICC line infection - s/p 2 week abx treatment  Glori Bickers MD 10/04/2015

## 2015-10-04 NOTE — Patient Instructions (Signed)
Doing great! Follow up 3 months.  Do the following things EVERYDAY: 1) Weigh yourself in the morning before breakfast. Write it down and keep it in a log. 2) Take your medicines as prescribed 3) Eat low salt foods-Limit salt (sodium) to 2000 mg per day.  4) Stay as active as you can everyday 5) Limit all fluids for the day to less than 2 liters

## 2015-10-04 NOTE — Addendum Note (Signed)
Encounter addended by: Effie Berkshire, RN on: 10/04/2015  2:44 PM<BR>     Documentation filed: Patient Instructions Section

## 2015-10-05 ENCOUNTER — Encounter (HOSPITAL_COMMUNITY): Payer: 59 | Admitting: Internal Medicine

## 2015-10-05 ENCOUNTER — Telehealth (HOSPITAL_COMMUNITY): Payer: Self-pay | Admitting: Cardiology

## 2015-10-05 NOTE — Telephone Encounter (Signed)
ABNORMAL LABS- LABS DRAWN WITH [facility] ON 10/04/15 POTASSIUM-5.4 PER ANDY TILLERY,PA PATIENT SHOULD STOP POTASSIUM SUPPLEMENT AND RECHECK IN 1 WEEK  PATIENT AWARE AND VOICED UNDERSTANDING PATIENTS BROTHER (CAREGIVER AND ASSIST WITH PATIENTS MEDICATIONS) AWARE AND VOICED UNDERSTANDING  [facility] TO DRAW LABS X 1 WEEK AS SCHEDULED

## 2015-10-10 ENCOUNTER — Other Ambulatory Visit (HOSPITAL_COMMUNITY): Payer: Self-pay | Admitting: Internal Medicine

## 2015-10-10 ENCOUNTER — Ambulatory Visit: Payer: Self-pay | Admitting: *Deleted

## 2015-10-11 ENCOUNTER — Other Ambulatory Visit: Payer: Self-pay | Admitting: *Deleted

## 2015-10-11 DIAGNOSIS — I480 Paroxysmal atrial fibrillation: Secondary | ICD-10-CM | POA: Diagnosis not present

## 2015-10-11 DIAGNOSIS — I2510 Atherosclerotic heart disease of native coronary artery without angina pectoris: Secondary | ICD-10-CM | POA: Diagnosis not present

## 2015-10-11 DIAGNOSIS — F0390 Unspecified dementia without behavioral disturbance: Secondary | ICD-10-CM | POA: Diagnosis not present

## 2015-10-11 DIAGNOSIS — I110 Hypertensive heart disease with heart failure: Secondary | ICD-10-CM | POA: Diagnosis not present

## 2015-10-11 DIAGNOSIS — D649 Anemia, unspecified: Secondary | ICD-10-CM | POA: Diagnosis not present

## 2015-10-11 DIAGNOSIS — I5022 Chronic systolic (congestive) heart failure: Secondary | ICD-10-CM | POA: Diagnosis not present

## 2015-10-11 NOTE — Patient Outreach (Signed)
S:  Pt and husband quarreled and quarreled about who should be involved in my home visits. We finally resolved that I would have face to face with [name] and then have a separate face to face with Mr. Jacqueline Reilly.  [name] is doing well considering her end stage HF. She denies any SOB, CP or edema. She does not remember to weigh. She does not eat any added salt. Mr. Jacqueline Reilly prepares her meals and what he has told me is nutritious but sometimes she just doesn't feel like eating. He also get her meds out of the med box in am and pm and gives them to her. Jacqueline Reilly, [name] brother prepares the med box.  Mr. Jacqueline Reilly went into a long explanation about how much he loves his wife and that she is his priority in life. He said he really wants me to emphasize this to [place]. He says they have decided to live in their home until they die. They have evidently had some experience with live in caregivers and it was not good, a lot of stealing, and he says he will never do that again.  O:  BP 102/62 mmHg  Pulse 66  Resp 18  Wt 87 lb (39.463 kg)  SpO2 97%        RRR loud murmur       Lungs are clear.       Has PICC line in place R upper arm       No pedal edema       Remote memory very good, short term memory is poor.  A:   End stage HF  P:  I don't have much to offer to this couple except for support and my availability.       I will see her again in a month and then maybe one more time and then close her case.  [name] [facility] [phone]

## 2015-10-17 DIAGNOSIS — I5022 Chronic systolic (congestive) heart failure: Secondary | ICD-10-CM | POA: Diagnosis not present

## 2015-10-18 DIAGNOSIS — I480 Paroxysmal atrial fibrillation: Secondary | ICD-10-CM | POA: Diagnosis not present

## 2015-10-18 DIAGNOSIS — F0390 Unspecified dementia without behavioral disturbance: Secondary | ICD-10-CM | POA: Diagnosis not present

## 2015-10-18 DIAGNOSIS — I110 Hypertensive heart disease with heart failure: Secondary | ICD-10-CM | POA: Diagnosis not present

## 2015-10-18 DIAGNOSIS — I5022 Chronic systolic (congestive) heart failure: Secondary | ICD-10-CM | POA: Diagnosis not present

## 2015-10-18 DIAGNOSIS — I2510 Atherosclerotic heart disease of native coronary artery without angina pectoris: Secondary | ICD-10-CM | POA: Diagnosis not present

## 2015-10-18 DIAGNOSIS — D649 Anemia, unspecified: Secondary | ICD-10-CM | POA: Diagnosis not present

## 2015-10-20 ENCOUNTER — Other Ambulatory Visit (HOSPITAL_COMMUNITY): Payer: Self-pay | Admitting: Internal Medicine

## 2015-10-23 DIAGNOSIS — D649 Anemia, unspecified: Secondary | ICD-10-CM | POA: Diagnosis not present

## 2015-10-23 DIAGNOSIS — I2510 Atherosclerotic heart disease of native coronary artery without angina pectoris: Secondary | ICD-10-CM | POA: Diagnosis not present

## 2015-10-23 DIAGNOSIS — I5022 Chronic systolic (congestive) heart failure: Secondary | ICD-10-CM | POA: Diagnosis not present

## 2015-10-23 DIAGNOSIS — I110 Hypertensive heart disease with heart failure: Secondary | ICD-10-CM | POA: Diagnosis not present

## 2015-10-23 DIAGNOSIS — F0390 Unspecified dementia without behavioral disturbance: Secondary | ICD-10-CM | POA: Diagnosis not present

## 2015-10-23 DIAGNOSIS — I480 Paroxysmal atrial fibrillation: Secondary | ICD-10-CM | POA: Diagnosis not present

## 2015-10-24 DIAGNOSIS — I2510 Atherosclerotic heart disease of native coronary artery without angina pectoris: Secondary | ICD-10-CM | POA: Diagnosis not present

## 2015-10-24 DIAGNOSIS — I480 Paroxysmal atrial fibrillation: Secondary | ICD-10-CM | POA: Diagnosis not present

## 2015-10-24 DIAGNOSIS — D649 Anemia, unspecified: Secondary | ICD-10-CM | POA: Diagnosis not present

## 2015-10-24 DIAGNOSIS — F0390 Unspecified dementia without behavioral disturbance: Secondary | ICD-10-CM | POA: Diagnosis not present

## 2015-10-24 DIAGNOSIS — Z79899 Other long term (current) drug therapy: Secondary | ICD-10-CM | POA: Diagnosis not present

## 2015-10-24 DIAGNOSIS — I5022 Chronic systolic (congestive) heart failure: Secondary | ICD-10-CM | POA: Diagnosis not present

## 2015-10-24 DIAGNOSIS — Z7901 Long term (current) use of anticoagulants: Secondary | ICD-10-CM | POA: Diagnosis not present

## 2015-10-24 DIAGNOSIS — I252 Old myocardial infarction: Secondary | ICD-10-CM | POA: Diagnosis not present

## 2015-10-24 DIAGNOSIS — Z452 Encounter for adjustment and management of vascular access device: Secondary | ICD-10-CM | POA: Diagnosis not present

## 2015-10-24 DIAGNOSIS — I110 Hypertensive heart disease with heart failure: Secondary | ICD-10-CM | POA: Diagnosis not present

## 2015-10-24 DIAGNOSIS — I714 Abdominal aortic aneurysm, without rupture: Secondary | ICD-10-CM | POA: Diagnosis not present

## 2015-10-25 DIAGNOSIS — Z452 Encounter for adjustment and management of vascular access device: Secondary | ICD-10-CM | POA: Diagnosis not present

## 2015-10-25 DIAGNOSIS — I480 Paroxysmal atrial fibrillation: Secondary | ICD-10-CM | POA: Diagnosis not present

## 2015-10-25 DIAGNOSIS — I5022 Chronic systolic (congestive) heart failure: Secondary | ICD-10-CM | POA: Diagnosis not present

## 2015-10-25 DIAGNOSIS — I2510 Atherosclerotic heart disease of native coronary artery without angina pectoris: Secondary | ICD-10-CM | POA: Diagnosis not present

## 2015-10-25 DIAGNOSIS — F0390 Unspecified dementia without behavioral disturbance: Secondary | ICD-10-CM | POA: Diagnosis not present

## 2015-10-25 DIAGNOSIS — I110 Hypertensive heart disease with heart failure: Secondary | ICD-10-CM | POA: Diagnosis not present

## 2015-10-27 ENCOUNTER — Other Ambulatory Visit (HOSPITAL_COMMUNITY): Payer: Self-pay | Admitting: Internal Medicine

## 2015-10-30 IMAGING — CR DG CHEST 2V
2 series · 2 of 2 positions shown · non-contrast
Comparison: 04/20/2009

CLINICAL DATA: Shortness of breath for 1 week

EXAM:
CHEST  2 VIEW

[w chest pa]
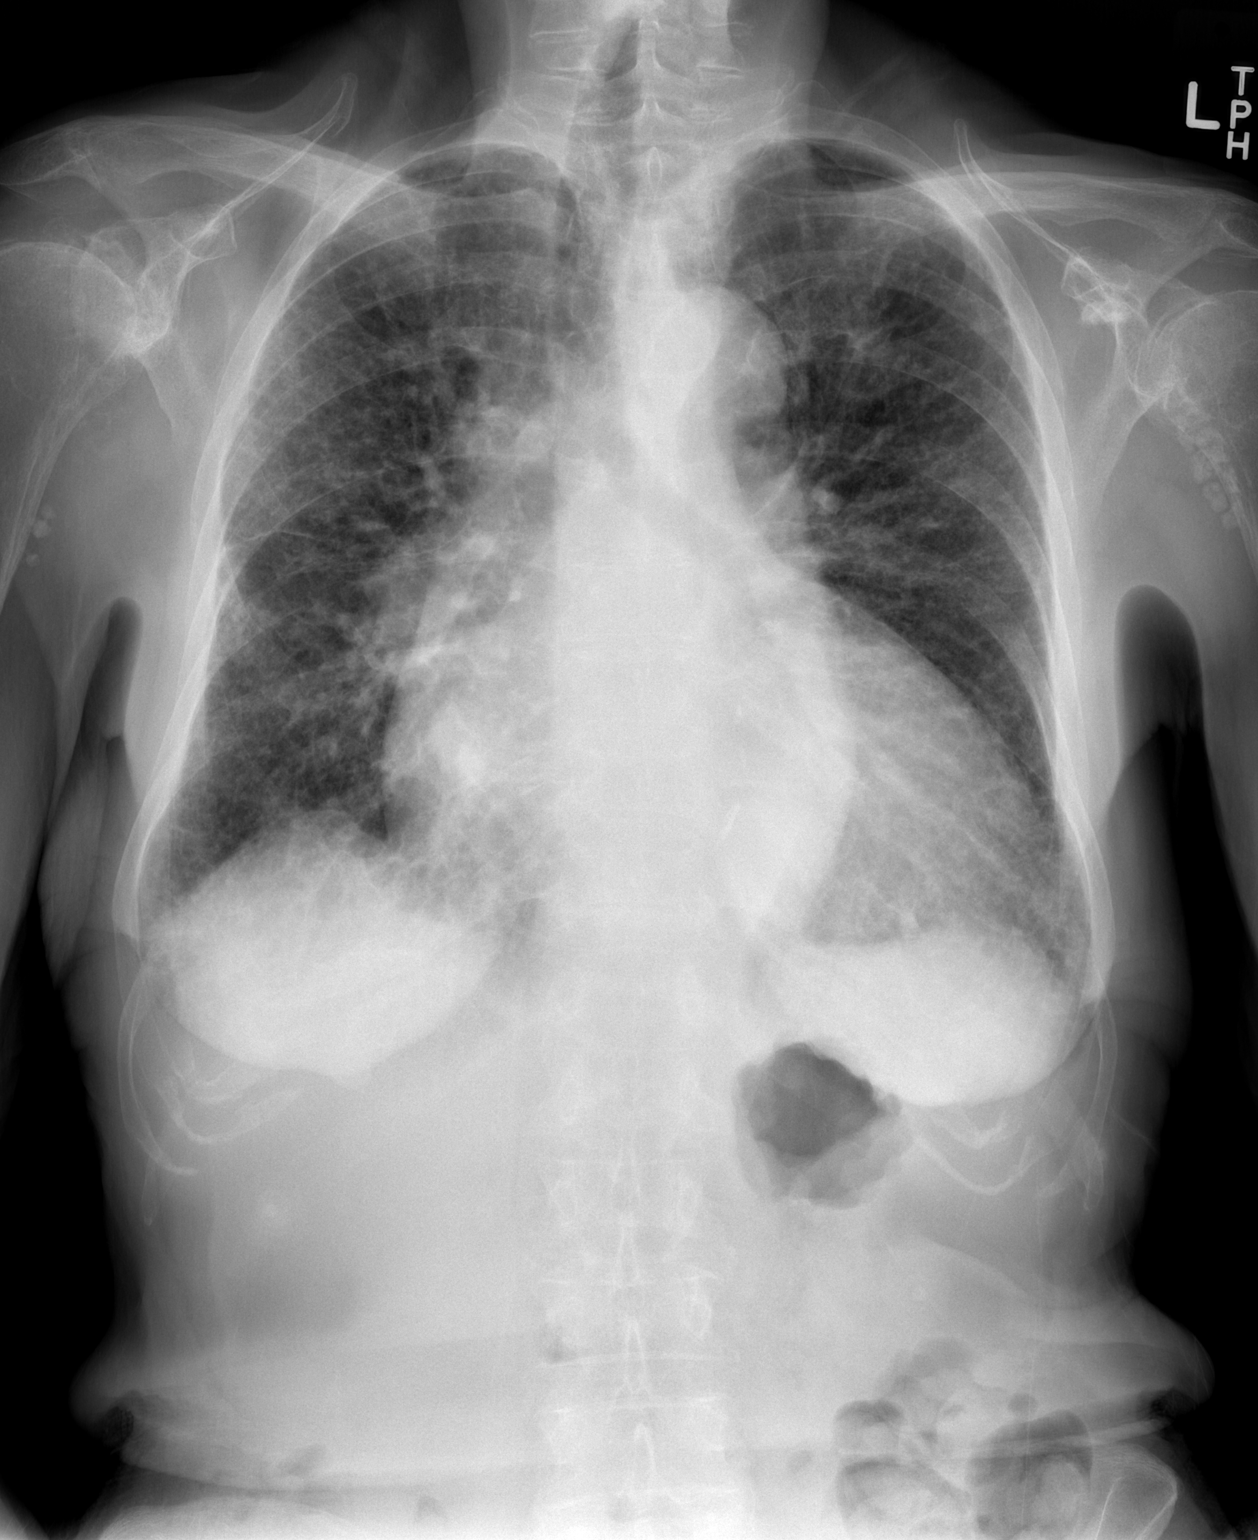

[w chest lat]
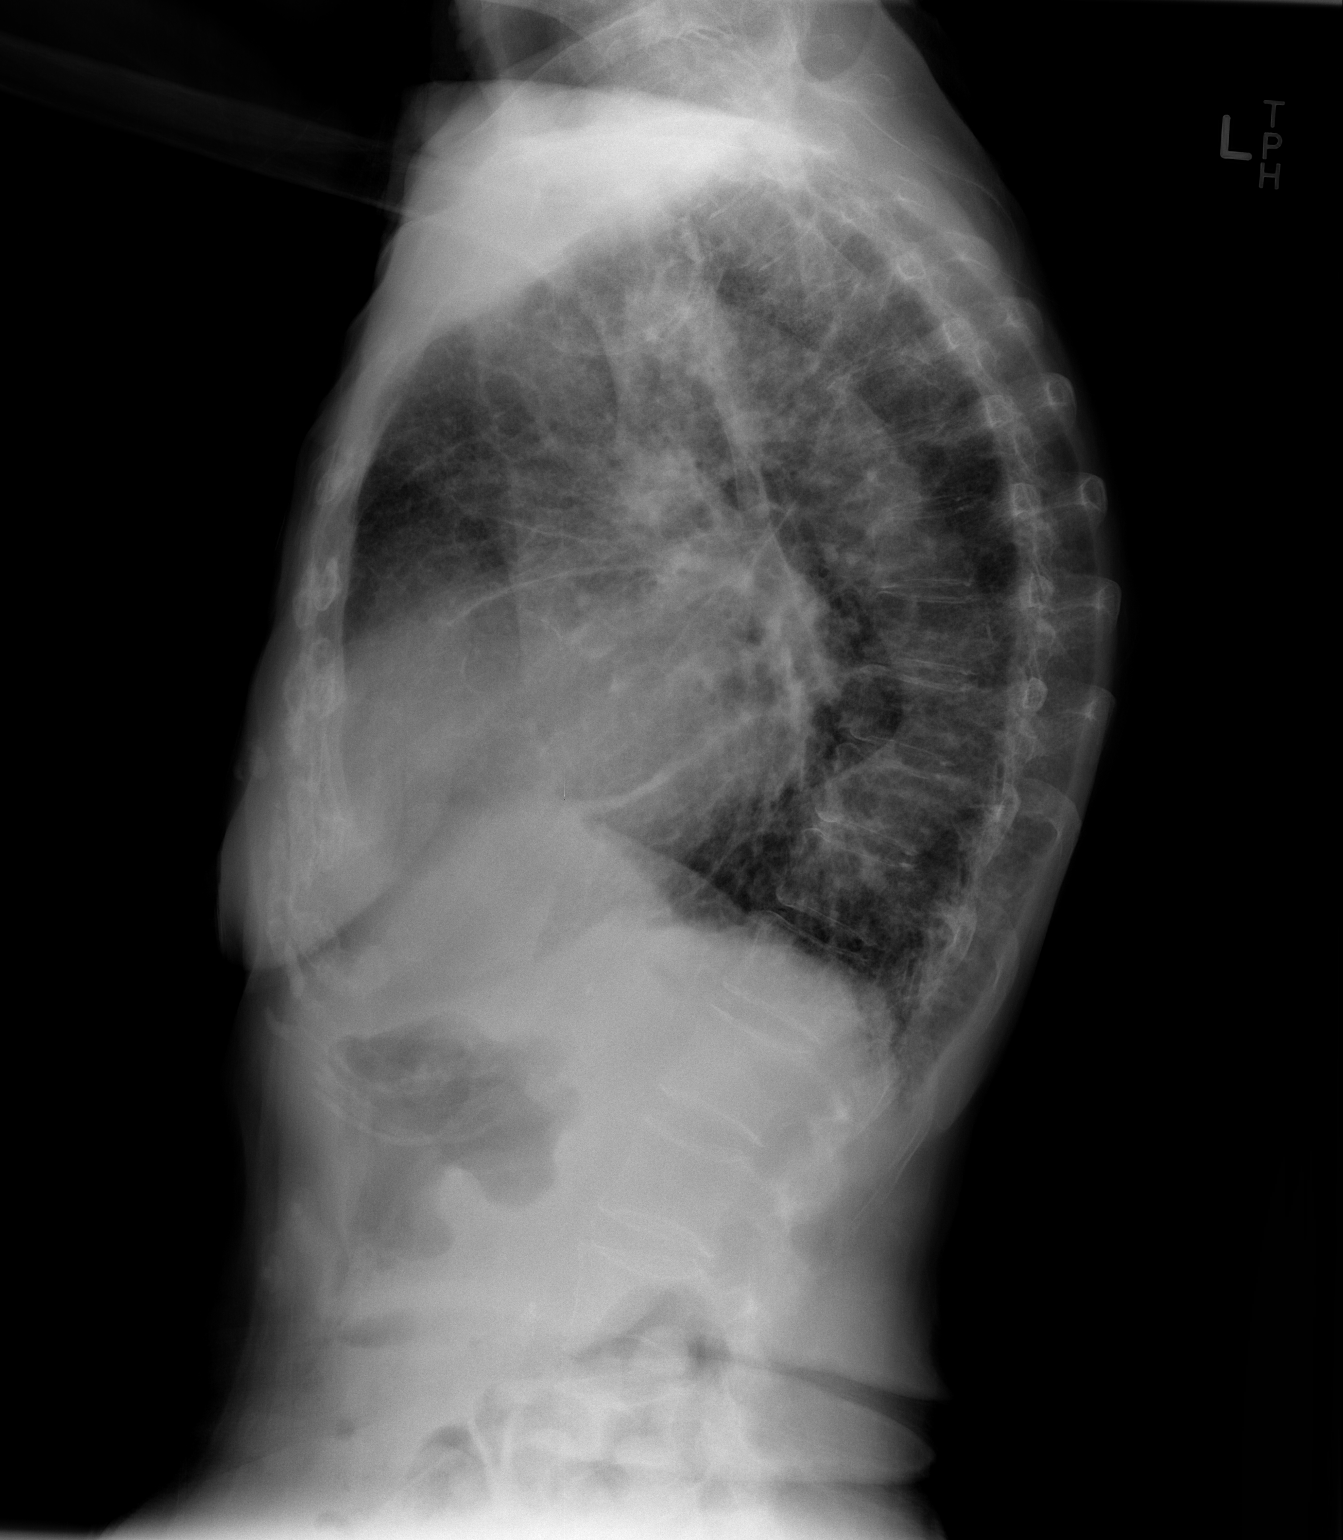

[2 of 2 positions shown; findings below may reference images not displayed]

FINDINGS: There is cardiomegaly which is likely progressed from previous.
Chronic aortic tortuosity. Aortic annular calcification noted in the
lateral projection. New diffuse interstitial abnormality with Kerley
B-lines. There is cephalized pulmonary blood flow. Some of the
opacities appears somewhat coarse, especially at the bases. No
evidence of effusion or pneumothorax.

Bilateral glenohumeral osteoarthritis with loose bodies.
IMPRESSION: 1. CHF.
2. Question background chronic lung disease/fibrosis, which would be
new from 0111 comparison.

## 2015-11-01 DIAGNOSIS — I480 Paroxysmal atrial fibrillation: Secondary | ICD-10-CM | POA: Diagnosis not present

## 2015-11-01 DIAGNOSIS — Z452 Encounter for adjustment and management of vascular access device: Secondary | ICD-10-CM | POA: Diagnosis not present

## 2015-11-01 DIAGNOSIS — I5022 Chronic systolic (congestive) heart failure: Secondary | ICD-10-CM | POA: Diagnosis not present

## 2015-11-01 DIAGNOSIS — I2510 Atherosclerotic heart disease of native coronary artery without angina pectoris: Secondary | ICD-10-CM | POA: Diagnosis not present

## 2015-11-01 DIAGNOSIS — F0390 Unspecified dementia without behavioral disturbance: Secondary | ICD-10-CM | POA: Diagnosis not present

## 2015-11-01 DIAGNOSIS — I110 Hypertensive heart disease with heart failure: Secondary | ICD-10-CM | POA: Diagnosis not present

## 2015-11-06 ENCOUNTER — Other Ambulatory Visit (HOSPITAL_COMMUNITY): Payer: Self-pay | Admitting: Internal Medicine

## 2015-11-08 ENCOUNTER — Other Ambulatory Visit: Payer: Self-pay | Admitting: *Deleted

## 2015-11-08 DIAGNOSIS — I480 Paroxysmal atrial fibrillation: Secondary | ICD-10-CM | POA: Diagnosis not present

## 2015-11-08 DIAGNOSIS — Z452 Encounter for adjustment and management of vascular access device: Secondary | ICD-10-CM | POA: Diagnosis not present

## 2015-11-08 DIAGNOSIS — I2510 Atherosclerotic heart disease of native coronary artery without angina pectoris: Secondary | ICD-10-CM | POA: Diagnosis not present

## 2015-11-08 DIAGNOSIS — I5022 Chronic systolic (congestive) heart failure: Secondary | ICD-10-CM | POA: Diagnosis not present

## 2015-11-08 DIAGNOSIS — I110 Hypertensive heart disease with heart failure: Secondary | ICD-10-CM | POA: Diagnosis not present

## 2015-11-08 DIAGNOSIS — F0390 Unspecified dementia without behavioral disturbance: Secondary | ICD-10-CM | POA: Diagnosis not present

## 2015-11-08 NOTE — Patient Outreach (Signed)
S:  Pt was home alone, husband out doing errands.      Jacqueline Reilly, reports she had a good holiday season. She denies any medical problems. She asks me if she will ever       be able to get off the milirone drip. She reports a fair appetitie, no falls. She does not weigh every day and she does       report a slight weight gain which appears to be from her nutritional intake rather than fluid gain.  O:  Today's visit was very cordial as it was a private visit with Jacqueline Reilly. I noted when she came to the door she had         her cane although was holding it up rather than using it. She was smiling and conversational.         BP 100/60 mmHg  Pulse 84  Resp 18  Wt 90 lb (40.824 kg)  SpO2 97%        RRR with a murmur        Lungs are clear        No edema.  A:  CHF - well managed  P:  Encouraged pt to weigh daily and write it down.       Encouraged her to eat a snack or 2 between meals.       Reminded her that she or her husband could call me if any problems arise.       I will see her in a month.  [name] [facility] [phone]

## 2015-11-09 ENCOUNTER — Other Ambulatory Visit (HOSPITAL_COMMUNITY): Payer: Self-pay | Admitting: Internal Medicine

## 2015-11-09 ENCOUNTER — Telehealth (HOSPITAL_COMMUNITY): Payer: Self-pay | Admitting: Cardiology

## 2015-11-09 NOTE — Telephone Encounter (Signed)
ABNORMAL LAB RESULTS K 5.3 PER VO ANDY TILLERY,PA PATIENT SHOULD LIMIT FOOD HIGH IN POTASSIUM (TOMATOES, BANANAS, MRS. DASH, GREEN LEAFY VEGETABLES)  PT/ AND PTS BROTHER AWARE AND VOICED Jacqueline Reilly

## 2015-11-10 ENCOUNTER — Other Ambulatory Visit (HOSPITAL_COMMUNITY): Payer: Self-pay | Admitting: Internal Medicine

## 2015-11-15 DIAGNOSIS — I480 Paroxysmal atrial fibrillation: Secondary | ICD-10-CM | POA: Diagnosis not present

## 2015-11-15 DIAGNOSIS — I5022 Chronic systolic (congestive) heart failure: Secondary | ICD-10-CM | POA: Diagnosis not present

## 2015-11-15 DIAGNOSIS — F0390 Unspecified dementia without behavioral disturbance: Secondary | ICD-10-CM | POA: Diagnosis not present

## 2015-11-15 DIAGNOSIS — Z452 Encounter for adjustment and management of vascular access device: Secondary | ICD-10-CM | POA: Diagnosis not present

## 2015-11-15 DIAGNOSIS — I110 Hypertensive heart disease with heart failure: Secondary | ICD-10-CM | POA: Diagnosis not present

## 2015-11-15 DIAGNOSIS — I2510 Atherosclerotic heart disease of native coronary artery without angina pectoris: Secondary | ICD-10-CM | POA: Diagnosis not present

## 2015-11-21 ENCOUNTER — Other Ambulatory Visit: Payer: Self-pay | Admitting: Internal Medicine

## 2015-11-22 DIAGNOSIS — I480 Paroxysmal atrial fibrillation: Secondary | ICD-10-CM | POA: Diagnosis not present

## 2015-11-22 DIAGNOSIS — F0390 Unspecified dementia without behavioral disturbance: Secondary | ICD-10-CM | POA: Diagnosis not present

## 2015-11-22 DIAGNOSIS — Z452 Encounter for adjustment and management of vascular access device: Secondary | ICD-10-CM | POA: Diagnosis not present

## 2015-11-22 DIAGNOSIS — I2510 Atherosclerotic heart disease of native coronary artery without angina pectoris: Secondary | ICD-10-CM | POA: Diagnosis not present

## 2015-11-22 DIAGNOSIS — I5022 Chronic systolic (congestive) heart failure: Secondary | ICD-10-CM | POA: Diagnosis not present

## 2015-11-22 DIAGNOSIS — I110 Hypertensive heart disease with heart failure: Secondary | ICD-10-CM | POA: Diagnosis not present

## 2015-11-24 ENCOUNTER — Other Ambulatory Visit (HOSPITAL_COMMUNITY): Payer: Self-pay | Admitting: Internal Medicine

## 2015-11-25 ENCOUNTER — Other Ambulatory Visit (HOSPITAL_COMMUNITY): Payer: Self-pay | Admitting: Internal Medicine

## 2015-11-29 ENCOUNTER — Other Ambulatory Visit (HOSPITAL_COMMUNITY): Payer: Self-pay | Admitting: Internal Medicine

## 2015-11-29 ENCOUNTER — Telehealth (HOSPITAL_COMMUNITY): Payer: Self-pay

## 2015-11-29 DIAGNOSIS — I480 Paroxysmal atrial fibrillation: Secondary | ICD-10-CM | POA: Diagnosis not present

## 2015-11-29 DIAGNOSIS — I2510 Atherosclerotic heart disease of native coronary artery without angina pectoris: Secondary | ICD-10-CM | POA: Diagnosis not present

## 2015-11-29 DIAGNOSIS — Z452 Encounter for adjustment and management of vascular access device: Secondary | ICD-10-CM | POA: Diagnosis not present

## 2015-11-29 DIAGNOSIS — F0390 Unspecified dementia without behavioral disturbance: Secondary | ICD-10-CM | POA: Diagnosis not present

## 2015-11-29 DIAGNOSIS — I5022 Chronic systolic (congestive) heart failure: Secondary | ICD-10-CM | POA: Diagnosis not present

## 2015-11-29 DIAGNOSIS — I110 Hypertensive heart disease with heart failure: Secondary | ICD-10-CM | POA: Diagnosis not present

## 2015-11-29 NOTE — Telephone Encounter (Signed)
[name] HHN from [place]. Not able to cannulate a  Peripheral vein after three attempts for bi-weekly labs Does Dr. Haroldine Laws want to:  Retry peripheral stick tomorrow ?  Retry peripheral stick next week ?  Draw lab from picc line ?

## 2015-11-30 NOTE — Telephone Encounter (Signed)
Dr. Haroldine Laws gave instructions to have them draw labs next week

## 2015-11-30 NOTE — Telephone Encounter (Signed)
Message given to [facility] to draw next week from peripheral site

## 2015-12-05 ENCOUNTER — Ambulatory Visit: Payer: Self-pay | Admitting: *Deleted

## 2015-12-06 ENCOUNTER — Encounter: Payer: Self-pay | Admitting: *Deleted

## 2015-12-06 ENCOUNTER — Other Ambulatory Visit: Payer: Self-pay | Admitting: *Deleted

## 2015-12-06 ENCOUNTER — Telehealth (HOSPITAL_COMMUNITY): Payer: Self-pay

## 2015-12-06 DIAGNOSIS — I5022 Chronic systolic (congestive) heart failure: Secondary | ICD-10-CM | POA: Diagnosis not present

## 2015-12-06 DIAGNOSIS — I110 Hypertensive heart disease with heart failure: Secondary | ICD-10-CM | POA: Diagnosis not present

## 2015-12-06 DIAGNOSIS — Z452 Encounter for adjustment and management of vascular access device: Secondary | ICD-10-CM | POA: Diagnosis not present

## 2015-12-06 DIAGNOSIS — I2510 Atherosclerotic heart disease of native coronary artery without angina pectoris: Secondary | ICD-10-CM | POA: Diagnosis not present

## 2015-12-06 DIAGNOSIS — I480 Paroxysmal atrial fibrillation: Secondary | ICD-10-CM | POA: Diagnosis not present

## 2015-12-06 DIAGNOSIS — F0390 Unspecified dementia without behavioral disturbance: Secondary | ICD-10-CM | POA: Diagnosis not present

## 2015-12-06 NOTE — Patient Outreach (Signed)
Called the Heart Failure Clinic - Requested appt ASAP for Jacqueline Reilly with bradycardia. I spoke to Jacqueline Reilly one of the nurses who is going to call Jacqueline Reilly/husband to see if she can come in. Jinny Blossom said she would notify me when appt will be.  Jacqueline Reilly is scheduled to be seen next Tuesday at 12:00. I have notified Jacqueline Reilly and her husband.  [name] [facility] [place] [phone]

## 2015-12-06 NOTE — Telephone Encounter (Signed)
Jacqueline Reilly [facility] RN called concerned about patients HR registering 39-43, however, BP 120/60, RR 18, and O2 sat 97%.  Patient reports feeling fine, no SOB, no edema, and lung sounds clear. Advised to have patient come to ED for further evaluation, however, patient refused because she is having company over at her home and feels fine. Dr. Haroldine Laws made aware, suspects chronic afib with pvcs giving false pulse reading as patient is asymptomatic and other vitals stable. Made patient appointment to come to clinic this week as she is due for her follow up anyways. Carol with [facility] to follow up with patient to ensure she will come to this appointment.  Renee Pain

## 2015-12-06 NOTE — Patient Outreach (Addendum)
S:  Visited pt in her home. Husband out on errands. Jacqueline Reilly tells me she is tired of the equipment, and would like to get rid of it. She says she agreed to it for her family. She denies any new problems, no CP, SOB or edema. She does not weigh every day. She does get her meds. Her appetite varies from day to day. She denies any falls.  O:  BP 120/60 mmHg  Pulse 39  Resp 18  Wt 93 lb 12.8 oz (42.547 kg)  SpO2 97%       Irregular heart rhythm and very bradycardic - no sxs - PICC line in place R upper arm       Lungs are clear       No edema  A:  End stage heart failure      Bradycardia  P:  I counseled pt on her condition, reminded her that the "equipment" is life sustaining for her and that if she chose to have it discontinued which she has a right to do, this may inevitably be the decision that may lead to the end of her life. I told her she should think about this and discuss with her family.  She needs a cardiac evaluation ASAP. I will call the Heart Failure Clinic for an appt.  I left Mr. Guzzetta a note that I had visited and asked about her last cardiology visit.  [name] [facility] [place] [phone]  Mr. Frazer called and left me a message last night after hours. He is very concerned about his wife and states she is very depressed. He said he agrees she needs to follow up with the heart failure clinic. (Mr. Pyon is verbose and demands your attention and he is very protective of his wife to the point where he may insist she have procedures or interventions that she really doesn't want. The two should be separated for the exam and then brought together to close after you have had an opportunity to talk with pt alone!)  [name] [facility] [place] [phone]

## 2015-12-07 ENCOUNTER — Inpatient Hospital Stay (HOSPITAL_COMMUNITY): Admission: RE | Admit: 2015-12-07 | Payer: Medicare Other | Source: Ambulatory Visit

## 2015-12-12 ENCOUNTER — Ambulatory Visit (HOSPITAL_COMMUNITY)
Admission: RE | Admit: 2015-12-12 | Discharge: 2015-12-12 | Disposition: A | Discharge status: Home / Self Care | Payer: Medicare Other | Source: Ambulatory Visit | Attending: Adult Health | Admitting: Adult Health

## 2015-12-12 ENCOUNTER — Other Ambulatory Visit (HOSPITAL_COMMUNITY): Payer: Self-pay | Admitting: Internal Medicine

## 2015-12-12 VITALS — BP 132/72 | HR 69 | Wt 94.0 lb

## 2015-12-12 DIAGNOSIS — D649 Anemia, unspecified: Secondary | ICD-10-CM

## 2015-12-12 DIAGNOSIS — I714 Abdominal aortic aneurysm, without rupture: Secondary | ICD-10-CM | POA: Insufficient documentation

## 2015-12-12 DIAGNOSIS — E785 Hyperlipidemia, unspecified: Secondary | ICD-10-CM | POA: Diagnosis not present

## 2015-12-12 DIAGNOSIS — I739 Peripheral vascular disease, unspecified: Secondary | ICD-10-CM | POA: Insufficient documentation

## 2015-12-12 DIAGNOSIS — I110 Hypertensive heart disease with heart failure: Secondary | ICD-10-CM | POA: Diagnosis not present

## 2015-12-12 DIAGNOSIS — Z7902 Long term (current) use of antithrombotics/antiplatelets: Secondary | ICD-10-CM | POA: Insufficient documentation

## 2015-12-12 DIAGNOSIS — I428 Other cardiomyopathies: Secondary | ICD-10-CM | POA: Diagnosis not present

## 2015-12-12 DIAGNOSIS — Z79899 Other long term (current) drug therapy: Secondary | ICD-10-CM | POA: Diagnosis not present

## 2015-12-12 DIAGNOSIS — I480 Paroxysmal atrial fibrillation: Secondary | ICD-10-CM

## 2015-12-12 DIAGNOSIS — Z8249 Family history of ischemic heart disease and other diseases of the circulatory system: Secondary | ICD-10-CM | POA: Diagnosis not present

## 2015-12-12 DIAGNOSIS — R413 Other amnesia: Secondary | ICD-10-CM | POA: Diagnosis not present

## 2015-12-12 DIAGNOSIS — Z955 Presence of coronary angioplasty implant and graft: Secondary | ICD-10-CM | POA: Insufficient documentation

## 2015-12-12 DIAGNOSIS — I2510 Atherosclerotic heart disease of native coronary artery without angina pectoris: Secondary | ICD-10-CM | POA: Diagnosis not present

## 2015-12-12 DIAGNOSIS — I5022 Chronic systolic (congestive) heart failure: Secondary | ICD-10-CM

## 2015-12-12 NOTE — Patient Instructions (Addendum)
Follow up 4 weeks

## 2015-12-12 NOTE — Progress Notes (Signed)
Patient ID: Jacqueline Reilly, female   DOB: 1924/02/15, 82 y.o.   MRN: [id number]   ADVANCED HF CLINIC NOTE  Patient ID: Jacqueline Reilly, female   DOB: 05/05/1924, 82 y.o.   MRN: [id number] PCP: [name] CHF: [place]  HPI: Jacqueline Reilly) is an 82 y/o woman with CAD, AAA, systolic HF, mild dementia, afib and CAD. She has had two previous coronary interventions including a cutting balloon to her D2 in 2007 and a DES to her mid LCX in 2010. Her LV dysfunction has been out of proportion to her CAD.  Last echo in 7/15 showed EF 20-25% with severe LV dilation, mild MR, and PA systolic pressure 38 mmHg.   Admitted 7/11-7/24/15 with syncope and dyspnea. She had new onset A-fib/RVR. Troponin was 1.03> 1.59> 2.18 . She was placed on amiodarone and heparin drip. She developed respiratory distress and required intubation. She converted to NSR but later was bradycardiac with NSVT. Amiodarone temporarily stopped but restarted after hyperkalemia resolved. Hyperkalemia was thought to be contributing to bradycardia. Diuresed with IV lasix. Co-ox 29% and started on milrinone which we tried to wean off but she did not tolerate. Discharge weight 87 lbs.   Admitted in November for Gram-negative bacteremia (Jacqueline Reilly) - enterobacter asburiae from PICC. Treated with ceftriaxone and then switched to oral levofloxacin for a total of 21 weeks and PICC replaced.   She returns today for HF follow up with her son and husband. Overall feeling ok. Denies SOB/PND/Orthonea. No BRBPR. She walks with a cane. Appetite ok. Husband and brother give her her medications.   Echo 8/16  EF 25% with diffuse hypokinesis, mild AS/mild AI, RV mildly dilated with moderately decreased systolic function.   Labs: (05/23/14) K+ 4.3, creatinine 1.06, digoxin 1.1 (8/15) K 4.7, creatinine 0.94, HCT 33.2 (9/15) K 3.4, creatinine .80 (10/1/0/2015) K 5.0 Creatinine 0.87 Magnesium 2.4  08/11/14 K 5.3 Creatinine 0.8 08/25/14 K 4.4 Cr 0.85 09/04/14   K  4.0 Cr 0.8 pBNP 1763 11/02/14  K 4.2 Cr 0.85 3/16 HCT 32.6, K 4.2, creatinine 0.82 03/03/15  K 3.1 Cr 0.99 BNP 249 8/16 HCT 29.4, K 4.6, creatinine 0.93 07/26/15 Hgb 9.7 K 4.6 creatinine 0.86  ROS: All systems negative except as listed in HPI, PMH and Problem List.  SH:  Social History   Social History  . Marital Status: Married    Spouse Name: N/A  . Number of Children: 1  . Years of Education: N/A   Occupational History  . retired    Social History Main Topics  . Smoking status: Never Smoker   . Smokeless tobacco: Never Used  . Alcohol Use: Yes     Comment: occasionally  . Drug Use: No  . Sexual Activity: Not Currently   Other Topics Concern  . Not on file   Social History Narrative   She lives in [place], family helps with her care.    FH:  Family History  Problem Relation Age of Onset  . Heart disease Mother   . Heart disease Father   . Heart disease Brother     x 3    Past Medical History  Diagnosis Date  . PVD (peripheral vascular disease) ([place])   . Ventricular dysfunction     left; ischemic  . Hyperlipidemia   . AAA (abdominal aortic aneurysm) ([place])   . Renal artery stenosis ([place])   . Hypertension   . Coronary artery disease 2007    moderate ASCAD of the left system  s/p PCI of the D2 and PCI of the left circ 03/2009  . Chronic systolic heart failure ([place])     a. ECHO (04/2014) EF 20-25%, diff HK, mild MR  . Atrial fibrillation ([place])   . CHF (congestive heart failure) ([place])     Current Outpatient Prescriptions  Medication Sig Dispense Refill  . amiodarone (PACERONE) 200 MG tablet Take 0.5 tablets (100 mg total) by mouth daily. 45 tablet 1  . ELIQUIS 2.5 MG TABS tablet TAKE ONE TABLET BY MOUTH TWICE DAILY 60 tablet 3  . lisinopril (PRINIVIL,ZESTRIL) 2.5 MG tablet TAKE ONE TABLET BY MOUTH ONE TIME DAILY 90 tablet 3  . milrinone (PRIMACOR) 20 MG/100ML SOLN infusion Inject 4.95 mcg/min into the vein continuous. Per [facility] 100 mL 3  .  Multiple Vitamin (MULTIVITAMIN) tablet Take 1 tablet by mouth daily. 30 tablet 1  . Omega-3 Fatty Acids (FISH OIL) 1000 MG CAPS Take 1 capsule by mouth daily.    Marland Kitchen torsemide (DEMADEX) 20 MG tablet TAKE TWO TABLETS BY MOUTH TWICE DAILY 120 tablet 3   No current facility-administered medications for this encounter.    Filed Vitals:   12/12/15 1229  BP: 132/72  Pulse: 69  Weight: 94 lb (42.638 kg)  SpO2: 98%    PHYSICAL EXAM: General:  Elderly appearing. Frail, No resp difficulty; Son and husband  present. Ambulated in the clinic with can. NAD.  HEENT: normal Neck: supple. JVP 5-6  Carotids 2+ bilaterally; no bruits. No lymphadenopathy or thryomegaly appreciated. Cor: PMI normal. Regular rate & rhythm. No rubs, gallops.  2/6 SEM RUSB with clear S2. Lungs: clear Abdomen: soft, nontender, nondistended. No hepatosplenomegaly. No bruits or masses. Good bowel sounds. Extremities: no cyanosis, clubbing, rash, edema LUE PICC site clear Neuro: alert & orientedx3, cranial nerves grossly intact. Moves all 4 extremities w/o difficulty. Affect pleasant.  ASSESSMENT & PLAN:   1) Chronic Systolic Heart Failure: Mixed ischemic/nonischemic cardiomyopathy (degree of LV dysfunction is out of proportion to CAD), EF 25% with moderate RV systolic dysfunction on echo today.  Stable NYHA II symptoms and volume status stable on milrinone. - Continue current milrinone gtt. - Continue torsemide 40 mg bid.  - Continue lisinopril 2.5 mg daily.   - Not on beta blocker with low output.   2) Atrial fibrillation: Paroxysmal.  Regular rhythm. Continue apixaban 2.5 mg bid (weight less than 60 kg and age >20). Continue amiodarone 100 mg daily.   3) Limited Code: No CPR or defibrillation 4) CAD: No chest pain. Off ASA with Eliquis.  5) Anemia: Hemoglobin ok from recent lab work.  6) Recent PICC line infection -  Resolved. 7) Impaired memory-  Per PCP. Needs follow up.  8) Family concerned about nutrition- Will ask  [facility] to follow up with dietitian.  Follow up 4 weeks.   Amy Clegg NP-C  12/12/2015

## 2015-12-12 NOTE — Progress Notes (Signed)
Advanced Heart Failure Medication Review by a Pharmacist  Does the patient  feel that his/her medications are working for him/her?  yes  Has the patient been experiencing any side effects to the medications prescribed?  no  Does the patient measure his/her own blood pressure or blood glucose at home?  no   Does the patient have any problems obtaining medications due to transportation or finances?   no  Understanding of regimen: poor Understanding of indications: poor Potential of compliance: fair Patient understands to avoid NSAIDs. Patient understands to avoid decongestants.  Issues to address at subsequent visits: None   Pharmacist comments:  Jacqueline Reilly is a pleasant 82 yo F presenting with her husband and son who are all talking at once. Jacqueline Reilly and her husband seem to have some memory issues and state that she is taking everything as her last AVS list states. I did not identify any discrepancies or medication-related issues at this time.   Jacqueline Reilly, PharmD, BCPS, CPP Clinical Pharmacist Pager: [phone] Phone: [phone] 12/12/2015 1:08 PM      Time with patient: 14 minutes Preparation and documentation time: 2 minutes Total time: 16 minutes

## 2015-12-13 DIAGNOSIS — I5022 Chronic systolic (congestive) heart failure: Secondary | ICD-10-CM | POA: Diagnosis not present

## 2015-12-13 DIAGNOSIS — F0390 Unspecified dementia without behavioral disturbance: Secondary | ICD-10-CM | POA: Diagnosis not present

## 2015-12-13 DIAGNOSIS — Z452 Encounter for adjustment and management of vascular access device: Secondary | ICD-10-CM | POA: Diagnosis not present

## 2015-12-13 DIAGNOSIS — I2510 Atherosclerotic heart disease of native coronary artery without angina pectoris: Secondary | ICD-10-CM | POA: Diagnosis not present

## 2015-12-13 DIAGNOSIS — I110 Hypertensive heart disease with heart failure: Secondary | ICD-10-CM | POA: Diagnosis not present

## 2015-12-13 DIAGNOSIS — I480 Paroxysmal atrial fibrillation: Secondary | ICD-10-CM | POA: Diagnosis not present

## 2015-12-17 ENCOUNTER — Other Ambulatory Visit: Payer: Self-pay | Admitting: Cardiology

## 2015-12-20 DIAGNOSIS — I480 Paroxysmal atrial fibrillation: Secondary | ICD-10-CM | POA: Diagnosis not present

## 2015-12-20 DIAGNOSIS — I5022 Chronic systolic (congestive) heart failure: Secondary | ICD-10-CM | POA: Diagnosis not present

## 2015-12-20 DIAGNOSIS — I2510 Atherosclerotic heart disease of native coronary artery without angina pectoris: Secondary | ICD-10-CM | POA: Diagnosis not present

## 2015-12-20 DIAGNOSIS — I110 Hypertensive heart disease with heart failure: Secondary | ICD-10-CM | POA: Diagnosis not present

## 2015-12-20 DIAGNOSIS — Z452 Encounter for adjustment and management of vascular access device: Secondary | ICD-10-CM | POA: Diagnosis not present

## 2015-12-20 DIAGNOSIS — F0390 Unspecified dementia without behavioral disturbance: Secondary | ICD-10-CM | POA: Diagnosis not present

## 2015-12-22 ENCOUNTER — Other Ambulatory Visit (HOSPITAL_COMMUNITY): Payer: Self-pay | Admitting: Internal Medicine

## 2015-12-23 DIAGNOSIS — Z79899 Other long term (current) drug therapy: Secondary | ICD-10-CM | POA: Diagnosis not present

## 2015-12-23 DIAGNOSIS — I714 Abdominal aortic aneurysm, without rupture: Secondary | ICD-10-CM | POA: Diagnosis not present

## 2015-12-23 DIAGNOSIS — I252 Old myocardial infarction: Secondary | ICD-10-CM | POA: Diagnosis not present

## 2015-12-23 DIAGNOSIS — F0390 Unspecified dementia without behavioral disturbance: Secondary | ICD-10-CM | POA: Diagnosis not present

## 2015-12-23 DIAGNOSIS — Z452 Encounter for adjustment and management of vascular access device: Secondary | ICD-10-CM | POA: Diagnosis not present

## 2015-12-23 DIAGNOSIS — I5022 Chronic systolic (congestive) heart failure: Secondary | ICD-10-CM | POA: Diagnosis not present

## 2015-12-23 DIAGNOSIS — I480 Paroxysmal atrial fibrillation: Secondary | ICD-10-CM | POA: Diagnosis not present

## 2015-12-23 DIAGNOSIS — Z7901 Long term (current) use of anticoagulants: Secondary | ICD-10-CM | POA: Diagnosis not present

## 2015-12-23 DIAGNOSIS — I2510 Atherosclerotic heart disease of native coronary artery without angina pectoris: Secondary | ICD-10-CM | POA: Diagnosis not present

## 2015-12-23 DIAGNOSIS — D649 Anemia, unspecified: Secondary | ICD-10-CM | POA: Diagnosis not present

## 2015-12-23 DIAGNOSIS — I110 Hypertensive heart disease with heart failure: Secondary | ICD-10-CM | POA: Diagnosis not present

## 2015-12-27 DIAGNOSIS — I110 Hypertensive heart disease with heart failure: Secondary | ICD-10-CM | POA: Diagnosis not present

## 2015-12-27 DIAGNOSIS — Z452 Encounter for adjustment and management of vascular access device: Secondary | ICD-10-CM | POA: Diagnosis not present

## 2015-12-27 DIAGNOSIS — I5022 Chronic systolic (congestive) heart failure: Secondary | ICD-10-CM | POA: Diagnosis not present

## 2015-12-27 DIAGNOSIS — I480 Paroxysmal atrial fibrillation: Secondary | ICD-10-CM | POA: Diagnosis not present

## 2015-12-27 DIAGNOSIS — I2510 Atherosclerotic heart disease of native coronary artery without angina pectoris: Secondary | ICD-10-CM | POA: Diagnosis not present

## 2015-12-27 DIAGNOSIS — F0390 Unspecified dementia without behavioral disturbance: Secondary | ICD-10-CM | POA: Diagnosis not present

## 2016-01-02 ENCOUNTER — Ambulatory Visit: Payer: Self-pay | Admitting: *Deleted

## 2016-01-02 ENCOUNTER — Other Ambulatory Visit: Payer: Self-pay | Admitting: *Deleted

## 2016-01-02 ENCOUNTER — Encounter: Payer: Self-pay | Admitting: *Deleted

## 2016-01-03 ENCOUNTER — Encounter: Payer: Self-pay | Admitting: *Deleted

## 2016-01-03 DIAGNOSIS — I2510 Atherosclerotic heart disease of native coronary artery without angina pectoris: Secondary | ICD-10-CM | POA: Diagnosis not present

## 2016-01-03 DIAGNOSIS — I480 Paroxysmal atrial fibrillation: Secondary | ICD-10-CM | POA: Diagnosis not present

## 2016-01-03 DIAGNOSIS — I5022 Chronic systolic (congestive) heart failure: Secondary | ICD-10-CM | POA: Diagnosis not present

## 2016-01-03 DIAGNOSIS — Z452 Encounter for adjustment and management of vascular access device: Secondary | ICD-10-CM | POA: Diagnosis not present

## 2016-01-03 DIAGNOSIS — F0390 Unspecified dementia without behavioral disturbance: Secondary | ICD-10-CM | POA: Diagnosis not present

## 2016-01-03 DIAGNOSIS — I110 Hypertensive heart disease with heart failure: Secondary | ICD-10-CM | POA: Diagnosis not present

## 2016-01-03 NOTE — Patient Outreach (Signed)
Discharge home visit.  S:  Pt is doing well and is stable. She had a visit at the CHF Clinic and no changes were made. She is to follow up in on 01/11/16 with them.   Jacqueline Reilly said the nutritionist has counseled them and he has tried to add more protein into Jacqueline Reilly's diet, although she eats what she wants to eat of what he has prepared.  O:  BP 100/50 mmHg  Pulse 72  Resp 18  Wt 90 lb (40.824 kg)  SpO2 96%       Some irregularity in heart rhythm, however, her heart rate is in the 70's today       A:  End stage CHF stable  P:  I am going to close pt case today. I have notified both Jacqueline Reilly of this, but have also advised them if they have issues that arise and they feel I could assist them they may call me at anytime.  [name] [name] [phone]

## 2016-01-07 ENCOUNTER — Telehealth: Payer: Self-pay | Admitting: Physician Assistant

## 2016-01-07 NOTE — Telephone Encounter (Signed)
Mr. Rehmert called because his wife was not eating or drinking today. It was mid afternoon and he was concerned.  There was a great deal of contention between them. I get them both on the phone to try and understand the situation better. They were constantly interrupting each other and yelling.  I asked to speak to Mrs. Knouff alone. She stated she had not eaten had anything to eat or drink because she just didn't feel like it today. I advised her that taking her medications and having something to eat or drink would be good for her body and requested she do so. She did not have any specific complaints. I requested that if she did not feel like she could eat or drink or take her medicines, she come to the emergency room to be evaluated. Mrs. Ewell did not wish to do this.  I advised Mr. Schoenbachler that we would not bring her to the emergency room against her will.  Lenoard Aden 01/07/2016 5:35 PM [place] [phone]

## 2016-01-08 ENCOUNTER — Emergency Department (HOSPITAL_COMMUNITY)
Admission: EM | Admit: 2016-01-08 | Discharge: 2016-01-08 | Disposition: A | Discharge status: Home / Self Care | Payer: Medicare Other | Attending: Emergency Medicine | Admitting: Emergency Medicine

## 2016-01-08 ENCOUNTER — Emergency Department (HOSPITAL_COMMUNITY): Payer: Medicare Other

## 2016-01-08 ENCOUNTER — Telehealth (HOSPITAL_COMMUNITY): Payer: Self-pay | Admitting: *Deleted

## 2016-01-08 ENCOUNTER — Encounter (HOSPITAL_COMMUNITY): Payer: Self-pay

## 2016-01-08 DIAGNOSIS — I2510 Atherosclerotic heart disease of native coronary artery without angina pectoris: Secondary | ICD-10-CM | POA: Diagnosis not present

## 2016-01-08 DIAGNOSIS — R011 Cardiac murmur, unspecified: Secondary | ICD-10-CM | POA: Diagnosis not present

## 2016-01-08 DIAGNOSIS — I10 Essential (primary) hypertension: Secondary | ICD-10-CM | POA: Diagnosis not present

## 2016-01-08 DIAGNOSIS — R1110 Vomiting, unspecified: Secondary | ICD-10-CM | POA: Diagnosis not present

## 2016-01-08 DIAGNOSIS — R112 Nausea with vomiting, unspecified: Secondary | ICD-10-CM | POA: Insufficient documentation

## 2016-01-08 DIAGNOSIS — I5022 Chronic systolic (congestive) heart failure: Secondary | ICD-10-CM | POA: Diagnosis not present

## 2016-01-08 DIAGNOSIS — E785 Hyperlipidemia, unspecified: Secondary | ICD-10-CM | POA: Insufficient documentation

## 2016-01-08 DIAGNOSIS — Z79899 Other long term (current) drug therapy: Secondary | ICD-10-CM | POA: Insufficient documentation

## 2016-01-08 DIAGNOSIS — R109 Unspecified abdominal pain: Secondary | ICD-10-CM | POA: Insufficient documentation

## 2016-01-08 DIAGNOSIS — R531 Weakness: Secondary | ICD-10-CM | POA: Diagnosis not present

## 2016-01-08 DIAGNOSIS — Z452 Encounter for adjustment and management of vascular access device: Secondary | ICD-10-CM | POA: Diagnosis not present

## 2016-01-08 DIAGNOSIS — K8020 Calculus of gallbladder without cholecystitis without obstruction: Secondary | ICD-10-CM | POA: Diagnosis not present

## 2016-01-08 DIAGNOSIS — E860 Dehydration: Secondary | ICD-10-CM | POA: Diagnosis not present

## 2016-01-08 LAB — BRAIN NATRIURETIC PEPTIDE: B Natriuretic Peptide: 231.2 pg/mL — ABNORMAL HIGH (ref 0.0–100.0)

## 2016-01-08 LAB — URINALYSIS, ROUTINE W REFLEX MICROSCOPIC
Bilirubin Urine: NEGATIVE
Glucose, UA: NEGATIVE mg/dL
Hgb urine dipstick: NEGATIVE
Ketones, ur: NEGATIVE mg/dL
Leukocytes, UA: NEGATIVE
Nitrite: NEGATIVE
Protein, ur: NEGATIVE mg/dL
Specific Gravity, Urine: 1.013 (ref 1.005–1.030)
pH: 7.5 (ref 5.0–8.0)

## 2016-01-08 LAB — COMPREHENSIVE METABOLIC PANEL
ALT: 10 U/L — ABNORMAL LOW (ref 14–54)
AST: 21 U/L (ref 15–41)
Albumin: 3.7 g/dL (ref 3.5–5.0)
Alkaline Phosphatase: 89 U/L (ref 38–126)
Anion gap: 15 (ref 5–15)
BUN: 21 mg/dL — ABNORMAL HIGH (ref 6–20)
CO2: 28 mmol/L (ref 22–32)
Calcium: 9.7 mg/dL (ref 8.9–10.3)
Chloride: 100 mmol/L — ABNORMAL LOW (ref 101–111)
Creatinine, Ser: 0.94 mg/dL (ref 0.44–1.00)
GFR calc Af Amer: >60 mL/min (ref >60)
GFR calc non Af Amer: 52 mL/min — ABNORMAL LOW (ref >60)
Glucose, Bld: 158 mg/dL — ABNORMAL HIGH (ref 65–99)
Potassium: 4.1 mmol/L (ref 3.5–5.1)
Sodium: 143 mmol/L (ref 135–145)
Total Bilirubin: 0.6 mg/dL (ref 0.3–1.2)
Total Protein: 6.8 g/dL (ref 6.5–8.1)

## 2016-01-08 LAB — CBC WITH DIFFERENTIAL/PLATELET
Basophils Absolute: 0 10*3/uL (ref 0.0–0.1)
Basophils Relative: 0 %
Eosinophils Absolute: 0 10*3/uL (ref 0.0–0.7)
Eosinophils Relative: 0 %
HCT: 35.6 % — ABNORMAL LOW (ref 36.0–46.0)
Hemoglobin: 11 g/dL — ABNORMAL LOW (ref 12.0–15.0)
Lymphocytes Relative: 11 %
Lymphs Abs: 0.6 10*3/uL — ABNORMAL LOW (ref 0.7–4.0)
MCH: 25.8 pg — ABNORMAL LOW (ref 26.0–34.0)
MCHC: 30.9 g/dL (ref 30.0–36.0)
MCV: 83.4 fL (ref 78.0–100.0)
Monocytes Absolute: 0.2 10*3/uL (ref 0.1–1.0)
Monocytes Relative: 4 %
Neutro Abs: 4.9 10*3/uL (ref 1.7–7.7)
Neutrophils Relative %: 85 %
Platelets: 273 10*3/uL (ref 150–400)
RBC: 4.27 MIL/uL (ref 3.87–5.11)
RDW: 15 % (ref 11.5–15.5)
WBC: 5.8 10*3/uL (ref 4.0–10.5)

## 2016-01-08 LAB — I-STAT CG4 LACTIC ACID, ED
Lactic Acid, Venous: 2.21 mmol/L (ref 0.5–2.0)
Lactic Acid, Venous: 1.39 mmol/L (ref 0.5–2.0)

## 2016-01-08 LAB — I-STAT TROPONIN, ED
Troponin i, poc: 0.02 ng/mL (ref 0.00–0.08)
Troponin i, poc: 0.02 ng/mL (ref 0.00–0.08)

## 2016-01-08 LAB — LIPASE, BLOOD: Lipase: 24 U/L (ref 11–51)

## 2016-01-08 MED ORDER — IOHEXOL 300 MG/ML  SOLN
80.0000 mL | Freq: Once | INTRAMUSCULAR | Status: AC | PRN
  Administered 2016-01-08: 75 mL via INTRAVENOUS

## 2016-01-08 MED ORDER — SODIUM CHLORIDE 0.9 % IV BOLUS (SEPSIS)
500.0000 mL | Freq: Once | INTRAVENOUS | Status: AC
  Administered 2016-01-08: 500 mL via INTRAVENOUS

## 2016-01-08 MED ORDER — IOHEXOL 300 MG/ML  SOLN: 50.0000 mL | Freq: Once | INTRAMUSCULAR | Status: DC | PRN

## 2016-01-08 MED ORDER — SODIUM CHLORIDE 0.9% FLUSH: 10.0000 mL | INTRAVENOUS | Status: DC | PRN

## 2016-01-08 MED ORDER — METOCLOPRAMIDE HCL 10 MG PO TABS: 10.0000 mg | ORAL_TABLET | Freq: Four times a day (QID) | ORAL | Status: DC

## 2016-01-08 MED ORDER — ONDANSETRON HCL 4 MG/2ML IJ SOLN
4.0000 mg | Freq: Once | INTRAMUSCULAR | Status: AC
  Administered 2016-01-08: 4 mg via INTRAVENOUS
  Filled 2016-01-08: qty 2

## 2016-01-08 NOTE — ED Notes (Signed)
Pt arrives EMS with c/o nausea. Denies vomitng. Unclear when nausea started.

## 2016-01-08 NOTE — Discharge Instructions (Signed)
You were seen in the emergency room today for evaluation of nausea and vomiting. Your tests were normal. Your symptoms are not likely due to your heart or lungs. Your CT scan of your abdomen and pelvis showed some chronic issues but no acute abnormality that would account for your symptoms. Your symptoms improved in the emergency room. I will give you a prescription for Reglan, a medicine that you may take as needed for nausea. Please call your primary care provider to schedule a follow up appointment this week. Return to the ER for new or worsening symptoms.

## 2016-01-08 NOTE — ED Notes (Signed)
IV team at bedside 

## 2016-01-08 NOTE — ED Provider Notes (Signed)
CSN: [id number]     Arrival date & time 01/08/16  1044 History   First MD Initiated Contact with Patient 01/08/16 1129     Chief Complaint  Patient presents with  . Nausea  . Emesis   HPI   [name] is an 82 y.o. female with history of CHF (on chronic milrinone via PICC), paroxysmal afib, AAA, CAD who presents to the ED for evaluation of nausea and weakness. She states yesterday she felt unwell with poor appetite all day. Last night she states she started feeling nauseated. She reports that overnight she had "countless" episodes of dry heaving but was unable to have a true episode of emesis. Denies diarrhea. Denies abdominal pain. She denies chest pain or SOB. She states she thinks her symptoms are due to heart failure. In the ED now she states she feels a bit queasy and very weak all over.   Past Medical History  Diagnosis Date  . PVD (peripheral vascular disease) ([place])   . Ventricular dysfunction     left; ischemic  . Hyperlipidemia   . AAA (abdominal aortic aneurysm) ([place])   . Renal artery stenosis ([place])   . Hypertension   . Coronary artery disease 2007    moderate ASCAD of the left system s/p PCI of the D2 and PCI of the left circ 03/2009  . Chronic systolic heart failure ([place])     a. ECHO (04/2014) EF 20-25%, diff HK, mild MR  . Atrial fibrillation ([place])   . CHF (congestive heart failure) [facility])    Past Surgical History  Procedure Laterality Date  . Retinal cryopexy      right eye (for retinal detachment)  . Angioplasty    . Coronary stent placement     Family History  Problem Relation Age of Onset  . Heart disease Mother   . Heart disease Father   . Heart disease Brother     x 3   Social History  Substance Use Topics  . Smoking status: Never Smoker   . Smokeless tobacco: Never Used  . Alcohol Use: Yes     Comment: occasionally   OB History    No data available     Review of Systems  All other systems reviewed and are negative.     Allergies  Codeine and  Garlic  Home Medications   Prior to Admission medications   Medication Sig Start Date End Date Taking? Authorizing Provider  amiodarone (PACERONE) 200 MG tablet Take 0.5 tablets (100 mg total) by mouth daily. 11/29/15   Jolaine Artist, MD  ELIQUIS 2.5 MG TABS tablet TAKE ONE TABLET BY MOUTH TWICE DAILY 12/19/15   Larey Dresser, MD  lisinopril (PRINIVIL,ZESTRIL) 2.5 MG tablet TAKE ONE TABLET BY MOUTH ONE TIME DAILY 11/27/15   Jolaine Artist, MD  milrinone [facility]) 20 MG/100ML SOLN infusion Inject 4.95 mcg/min into the vein continuous. Per [facility] 05/20/14   Evelene Croon Barrett, PA-C  Multiple Vitamin (MULTIVITAMIN) tablet Take 1 tablet by mouth daily. 07/26/14   Larey Dresser, MD  Omega-3 Fatty Acids (FISH OIL) 1000 MG CAPS Take 1 capsule by mouth daily.    Historical Provider, MD  torsemide (DEMADEX) 20 MG tablet TAKE TWO TABLETS BY MOUTH TWICE DAILY 11/21/15   Jolaine Artist, MD   BP 117/66 mmHg  Pulse 60  Resp 22  Ht 4\' 11"  (1.499 m)  Wt 40.824 kg  BMI 18.17 kg/m2  SpO2 96% Physical Exam  Constitutional: She is  oriented to person, place, and time.  Thin, frail, elderly.  HENT:  Right Ear: External ear normal.  Left Ear: External ear normal.  Nose: Nose normal.  Mouth/Throat: Oropharynx is clear and moist. Mucous membranes are dry. No oropharyngeal exudate.  Eyes: Conjunctivae and EOM are normal. Pupils are equal, round, and reactive to light.  Neck: Normal range of motion. Neck supple.  Cardiovascular: Normal rate, regular rhythm and intact distal pulses.   Murmur heard. Pulmonary/Chest: Effort normal and breath sounds normal. No respiratory distress. She has no wheezes. She exhibits no tenderness.  Abdominal: Soft. Bowel sounds are normal. She exhibits no distension. There is no tenderness. There is no rebound and no guarding.  Musculoskeletal: She exhibits no edema.  Neurological: She is alert and oriented to person, place, and time. No cranial nerve  deficit.  Skin: Skin is warm and dry. There is pallor.  Psychiatric: She has a normal mood and affect.  Nursing note and vitals reviewed.   ED Course  Procedures (including critical care time) Labs Review Labs Reviewed  COMPREHENSIVE METABOLIC PANEL - Abnormal; Notable for the following:    Chloride 100 (*)    Glucose, Bld 158 (*)    BUN 21 (*)    ALT 10 (*)    GFR calc non Af Amer 52 (*)    All other components within normal limits  BRAIN NATRIURETIC PEPTIDE - Abnormal; Notable for the following:    B Natriuretic Peptide 231.2 (*)    All other components within normal limits  CBC WITH DIFFERENTIAL/PLATELET - Abnormal; Notable for the following:    Hemoglobin 11.0 (*)    HCT 35.6 (*)    MCH 25.8 (*)    Lymphs Abs 0.6 (*)    All other components within normal limits  I-STAT CG4 LACTIC ACID, ED - Abnormal; Notable for the following:    Lactic Acid, Venous 2.21 (*)    All other components within normal limits  LIPASE, BLOOD  URINALYSIS, ROUTINE W REFLEX MICROSCOPIC (NOT AT [facility])  I-STAT TROPOININ, ED  I-STAT TROPOININ, ED  I-STAT CG4 LACTIC ACID, ED    Imaging Review Dg Chest 2 View  01/08/2016  CLINICAL DATA:  Nausea and vomiting today EXAM: CHEST  2 VIEW COMPARISON:  08/31/2015 FINDINGS: Moderate cardiac enlargement. Uncoiling of the aorta. Severe shoulder arthropathy. Stable mild to moderate chronic interstitial lung disease. Right PICC line identified is new from the prior study. It extends about 6.2 cm beyond the cavoatrial junction deep into the right atrium. No pneumothorax. IMPRESSION: Chronic cardiac enlargement and interstitial lung disease. Right PICC line extends approximately 6 cm further than is considered optimal and likely should be retracted by about 6 cm with repeat imaging to verify. Electronically Signed   By: Skipper Cliche M.D.   On: 01/08/2016 12:48   Ct Abdomen Pelvis W Contrast  01/08/2016  CLINICAL DATA:  Nausea and vomiting. History of abdominal  aortic aneurysm. EXAM: CT ABDOMEN AND PELVIS WITH CONTRAST TECHNIQUE: Multidetector CT imaging of the abdomen and pelvis was performed using the standard protocol following bolus administration of intravenous contrast. CONTRAST:  69mL OMNIPAQUE IOHEXOL 300 MG/ML  SOLN COMPARISON:  None. FINDINGS: Lower chest: There is fibrotic change in the lung bases. There is no lung base edema or consolidation. There are foci of atherosclerotic calcification in the visualized proximal ascending thoracic aorta. There is a pacemaker with leads attached to the right heart. Visualized pericardium is not thickened. Hepatobiliary: No focal liver lesions are apparent. There is  cholelithiasis. Gallbladder wall does not appear appreciably thickened. There is no demonstrable biliary duct dilatation. Pancreas: No pancreatic mass or inflammatory focus. Spleen: No splenic lesions are evident. Adrenals/Urinary Tract: Adrenals appear unremarkable bilaterally. There is a focal area of decreased attenuation in the lower pole of the right kidney anteriorly measuring 1.3 x 1.3 cm which shows mildly delayed enhancement. There is no demonstrable hydronephrosis on either side. There is no renal or ureteral calculus on either side. Urinary bladder is midline with wall thickness within normal limits. Stomach/Bowel: There is no bowel wall or mesenteric thickening. No bowel obstruction. No free air or portal venous air. Vascular/Lymphatic: There is aneurysmal dilatation the aorta which arises inferior to the renal arteries and terminates at the bifurcation. The maximum transverse diameter of this abdominal aortic aneurysm is 4.0 x 3.8 cm. There is no periaortic fluid. There is atherosclerotic calcification in the common iliac arteries bilaterally. There is no appreciable adenopathy in the abdomen or pelvis. Reproductive: There is a 1.9 x 1.3 cm right ovarian cystic structure. No other pelvic mass. No free pelvic fluid. Uterus does not appear enlarged and  is mildly anteverted. Other: There is no periappendiceal region inflammation. No abscess or ascites in the abdomen or pelvis. There is a small ventral hernia containing only fat. Musculoskeletal: Bones are osteoporotic. There is marked disc space narrowing at L5-S1. There are no blastic or lytic bone lesions. There is no intramuscular or abdominal wall lesion. There is narrowing of each hip joint. IMPRESSION: There is an abdominal aortic aneurysm arising slightly inferior to the renal arteries and extending to the bifurcation with a maximum transverse diameter of 4.0 x 3.8 cm. Recommend followup by ultrasound in 1 year. This recommendation follows ACR consensus guidelines: White Paper of the ACR Incidental Findings Committee II on Vascular Findings. J Am Coll Radiol 2013; 10:789-794. Cholelithiasis. Small apparent mass lower pole right kidney showing delayed enhancement. A small renal neoplasm in this area cannot be excluded. This finding may warrant close clinical and imaging surveillance. Given the patient's pacemaker, pre and post contrast CT of the kidneys would be the imaging study of choice for further monitoring/assessment. 1.9 x 1.3 cm right ovarian cystic structure, likely a simple cyst. In this age group and given the small size of ovarian cystic structure, further assessment is not considered warranted. This recommendation follows ACR consensus guidelines: White Paper of the ACR Incidental Findings Committee II on Adnexal Findings. J Am Coll Radiol [phone]. Small ventral hernia containing only fat. No bowel obstruction.  No abscess.  Appendix region unremarkable. Electronically Signed   By: Lowella Grip III M.D.   On: 01/08/2016 14:22   Dg Chest Portable 1 View  01/08/2016  CLINICAL DATA:  PICC line placement EXAM: PORTABLE CHEST 1 VIEW COMPARISON:  Chest x-ray from earlier same day FINDINGS: Right-sided PICC line has been retracted and now appears well positioned with tip projected over  the lower SVC. Cardiomegaly is unchanged. Overall cardiomediastinal silhouette is stable in size and configuration. Coarse interstitial markings again noted throughout both lungs, unchanged in the short-term interval, similar to earlier study of 08/31/2015 suggesting chronic interstitial lung disease. No new lung findings. Degenerative changes again noted at each shoulder. No acute-appearing osseous abnormality. IMPRESSION: Right-sided PICC line now appears well positioned with tip projected over the lower SVC. Electronically Signed   By: Franki Cabot M.D.   On: 01/08/2016 16:58   I have personally reviewed and evaluated these images and lab results as part of my medical  decision-making.   EKG Interpretation   Date/Time:  Monday January 08 2016 10:45:10 EDT Ventricular Rate:  61 PR Interval:  246 QRS Duration: 141 QT Interval:  462 QTC Calculation: 465 R Axis:   -52 Text Interpretation:  Sinus rhythm Atrial premature complex Prolonged PR  interval Left bundle branch block No significant change since last tracing  Confirmed by JACUBOWITZ  MD, Derris Millan ([id number]) on 01/08/2016 10:48:42 AM      MDM   Final diagnoses:  Non-intractable vomiting with nausea, vomiting of unspecified type    Trop negative. EKG unchanged from prior. Lactic 2.21. Will gently rehydrate. CXR shows PICC line that is 6cm further than optimal with suggestion to retract 6cm. IV team consulted for PICC line repositioning; they will come see pt. CT abd/pelvis pending.  CT negative for acute findings. Pt does have AAA, cholelithiasis, renal mass, and ovarian cyst. Will repeat trop and lactic acid. IV team fixed PICC. Will repeat CXR to confirm placement.   Repeat trop negative, lactic normal. Pt reports she feels much better and is ready to go home. She is tolerating PO. CXR shows appropriate PICC line placement. At this time no indication for further emergent workup. Suspect viral etiology of pt's symptoms. Doubt ACS or other  acute intra-abdominal etiology. Instructed close follow up with PCP. ER return precautions given.  Anne Ng, PA-C 01/08/16 1703  Orlie Dakin, MD 01/08/16 [phone]

## 2016-01-08 NOTE — ED Notes (Signed)
MD at bedside. 

## 2016-01-08 NOTE — ED Provider Notes (Signed)
Plan of nausea with multiple episodes dry heaves onset yesterday he denies abdominal pain denies chest pain denies other associated symptoms no diarrhea. On exam alert no distress lungs clear auscultation heart regular rate and rhythm abdomen nondistended nontender. Extremities without edema  Orlie Dakin, MD 01/08/16 1756

## 2016-01-08 NOTE — ED Notes (Signed)
Pt to CT

## 2016-01-08 NOTE — Telephone Encounter (Signed)
Pt's husband left a VM at 9:23 this AM that pt was having vomiting.    Returned call to husband he states pt has been sick all night and he call for ambulance, EMS is present at home now and will bring pt to ER

## 2016-01-08 NOTE — Progress Notes (Signed)
[facility]  Patient Status: Active pt with [facility]   [facility] is providing the following services: HHRN and Home Inotrope Pharmacy Team for home Milrinone.  [facility] hospital team will follow pt while her to support transition home when ordered.  If patient discharges after hours, please call [phone].   [name] 01/08/2016, 12:27 PM

## 2016-01-08 NOTE — ED Notes (Signed)
Pt and family verbalize understanding of instructions.

## 2016-01-08 NOTE — ED Notes (Signed)
Pt urged to drink contrast. States nausea has subsided

## 2016-01-10 DIAGNOSIS — Z7901 Long term (current) use of anticoagulants: Secondary | ICD-10-CM | POA: Diagnosis not present

## 2016-01-10 DIAGNOSIS — I2510 Atherosclerotic heart disease of native coronary artery without angina pectoris: Secondary | ICD-10-CM | POA: Diagnosis not present

## 2016-01-10 DIAGNOSIS — I110 Hypertensive heart disease with heart failure: Secondary | ICD-10-CM | POA: Diagnosis not present

## 2016-01-10 DIAGNOSIS — I744 Embolism and thrombosis of arteries of extremities, unspecified: Secondary | ICD-10-CM | POA: Diagnosis not present

## 2016-01-10 DIAGNOSIS — F0390 Unspecified dementia without behavioral disturbance: Secondary | ICD-10-CM | POA: Diagnosis not present

## 2016-01-10 DIAGNOSIS — I480 Paroxysmal atrial fibrillation: Secondary | ICD-10-CM | POA: Diagnosis not present

## 2016-01-10 DIAGNOSIS — Z452 Encounter for adjustment and management of vascular access device: Secondary | ICD-10-CM | POA: Diagnosis not present

## 2016-01-10 DIAGNOSIS — D649 Anemia, unspecified: Secondary | ICD-10-CM | POA: Diagnosis not present

## 2016-01-10 DIAGNOSIS — I5022 Chronic systolic (congestive) heart failure: Secondary | ICD-10-CM | POA: Diagnosis not present

## 2016-01-11 ENCOUNTER — Ambulatory Visit (HOSPITAL_COMMUNITY)
Admission: RE | Admit: 2016-01-11 | Discharge: 2016-01-11 | Disposition: A | Discharge status: Home / Self Care | Payer: Medicare Other | Source: Ambulatory Visit | Attending: Internal Medicine | Admitting: Internal Medicine

## 2016-01-11 VITALS — BP 128/76 | HR 78 | Wt 95.0 lb

## 2016-01-11 DIAGNOSIS — Z955 Presence of coronary angioplasty implant and graft: Secondary | ICD-10-CM | POA: Diagnosis not present

## 2016-01-11 DIAGNOSIS — I428 Other cardiomyopathies: Secondary | ICD-10-CM | POA: Insufficient documentation

## 2016-01-11 DIAGNOSIS — I110 Hypertensive heart disease with heart failure: Secondary | ICD-10-CM | POA: Insufficient documentation

## 2016-01-11 DIAGNOSIS — D649 Anemia, unspecified: Secondary | ICD-10-CM | POA: Insufficient documentation

## 2016-01-11 DIAGNOSIS — R413 Other amnesia: Secondary | ICD-10-CM | POA: Insufficient documentation

## 2016-01-11 DIAGNOSIS — I701 Atherosclerosis of renal artery: Secondary | ICD-10-CM | POA: Diagnosis not present

## 2016-01-11 DIAGNOSIS — E785 Hyperlipidemia, unspecified: Secondary | ICD-10-CM | POA: Insufficient documentation

## 2016-01-11 DIAGNOSIS — I739 Peripheral vascular disease, unspecified: Secondary | ICD-10-CM | POA: Diagnosis not present

## 2016-01-11 DIAGNOSIS — I5022 Chronic systolic (congestive) heart failure: Secondary | ICD-10-CM | POA: Diagnosis present

## 2016-01-11 DIAGNOSIS — Z79899 Other long term (current) drug therapy: Secondary | ICD-10-CM | POA: Diagnosis not present

## 2016-01-11 DIAGNOSIS — I714 Abdominal aortic aneurysm, without rupture: Secondary | ICD-10-CM | POA: Insufficient documentation

## 2016-01-11 DIAGNOSIS — I480 Paroxysmal atrial fibrillation: Secondary | ICD-10-CM | POA: Diagnosis not present

## 2016-01-11 DIAGNOSIS — I420 Dilated cardiomyopathy: Secondary | ICD-10-CM

## 2016-01-11 DIAGNOSIS — Z7901 Long term (current) use of anticoagulants: Secondary | ICD-10-CM | POA: Diagnosis not present

## 2016-01-11 DIAGNOSIS — Z8249 Family history of ischemic heart disease and other diseases of the circulatory system: Secondary | ICD-10-CM | POA: Diagnosis not present

## 2016-01-11 DIAGNOSIS — I2510 Atherosclerotic heart disease of native coronary artery without angina pectoris: Secondary | ICD-10-CM | POA: Insufficient documentation

## 2016-01-11 DIAGNOSIS — I255 Ischemic cardiomyopathy: Secondary | ICD-10-CM

## 2016-01-11 NOTE — Progress Notes (Signed)
Advanced Heart Failure Medication Review by a Pharmacist  Does the patient  feel that his/her medications are working for him/her?  no  Has the patient been experiencing any side effects to the medications prescribed?  no  Does the patient measure his/her own blood pressure or blood glucose at home?  no   Does the patient have any problems obtaining medications due to transportation or finances?   no  Understanding of regimen: poor Understanding of indications: poor Potential of compliance: poor Patient understands to avoid NSAIDs. Patient understands to avoid decongestants.  Issues to address at subsequent visits: None   Pharmacist comments: 82 YO female presents with 2 family members for HF followup. Pt states that her and her husband have memory issues and she ensures that she is taking everything that her last AVS list states. Pt denies s/sx of bleeding on eliquis or medication related issues at this time.   Time with patient: 10 min  Preparation and documentation time: 5 min  Total time: 15 min

## 2016-01-11 NOTE — Progress Notes (Signed)
Patient ID: Jacqueline Reilly, female   DOB: Apr 13, 1924, 82 y.o.   MRN: [id number] Patient ID: Jacqueline Reilly, female   DOB: February 10, 1924, 82 y.o.   MRN: [id number]   [facility] NOTE  Patient ID: Jacqueline Reilly, female   DOB: 01/19/1924, 82 y.o.   MRN: [id number] PCP: [name] CHF: [place]  HPI: [name] is an 82 y/o woman with CAD, AAA, systolic HF, mild dementia, afib and CAD. She has had two previous coronary interventions including a cutting balloon to her D2 in 2007 and a DES to her mid LCX in 2010. Her LV dysfunction has been out of proportion to her CAD.  Last echo in 7/15 showed EF 20-25% with severe LV dilation, mild MR, and PA systolic pressure 38 mmHg.   Admitted 7/11-7/24/15 with syncope and dyspnea. She had new onset A-fib/RVR. Troponin was 1.03> 1.59> 2.18 . She was placed on amiodarone and heparin drip. She developed respiratory distress and required intubation. She converted to NSR but later was bradycardiac with NSVT. Amiodarone temporarily stopped but restarted after hyperkalemia resolved. Hyperkalemia was thought to be contributing to bradycardia. Diuresed with IV lasix. Co-ox 29% and started on milrinone which we tried to wean off but she did not tolerate. Discharge weight 87 lbs.   Admitted in November for Gram-negative bacteremia ([place]) - enterobacter asburiae from PICC. Treated with ceftriaxone and then switched to oral levofloxacin for a total of 21 weeks and PICC replaced.   She returns today for HF follow up with her son and husband. Evaluated in [facility] ED with GI virus a week ago. She was given IV fluids and sent home.  Overall feeling ok. Denies SOB/PND/Orthonea. No BRBPR. She walks with a cane. Per her husband she is not weighing. Appetite ok. Husband and brother give her her medications.   Echo 8/16  EF 25% with diffuse hypokinesis, mild AS/mild AI, RV mildly dilated with moderately decreased systolic function.   Labs: (05/23/14) K+ 4.3, creatinine 1.06,  digoxin 1.1 (8/15) K 4.7, creatinine 0.94, HCT 33.2 (9/15) K 3.4, creatinine .80 (10/1/0/2015) K 5.0 Creatinine 0.87 Magnesium 2.4  08/11/14 K 5.3 Creatinine 0.8 08/25/14 K 4.4 Cr 0.85 09/04/14   K 4.0 Cr 0.8 pBNP 1763 11/02/14  K 4.2 Cr 0.85 3/16 HCT 32.6, K 4.2, creatinine 0.82 03/03/15  K 3.1 Cr 0.99 BNP 249 8/16 HCT 29.4, K 4.6, creatinine 0.93 07/26/15 Hgb 9.7 K 4.6 creatinine 0.86  ROS: All systems negative except as listed in HPI, PMH and Problem List.  SH:  Social History   Social History  . Marital Status: Married    Spouse Name: N/A  . Number of Children: 1  . Years of Education: N/A   Occupational History  . retired    Social History Main Topics  . Smoking status: Never Smoker   . Smokeless tobacco: Never Used  . Alcohol Use: Yes     Comment: occasionally  . Drug Use: No  . Sexual Activity: Not Currently   Other Topics Concern  . Not on file   Social History Narrative   She lives in [place], family helps with her care.    FH:  Family History  Problem Relation Age of Onset  . Heart disease Mother   . Heart disease Father   . Heart disease Brother     x 3    Past Medical History  Diagnosis Date  . PVD (peripheral vascular disease) ([place])   . Ventricular dysfunction  left; ischemic  . Hyperlipidemia   . AAA (abdominal aortic aneurysm) ([place])   . Renal artery stenosis ([place])   . Hypertension   . Coronary artery disease 2007    moderate ASCAD of the left system s/p PCI of the D2 and PCI of the left circ 03/2009  . Chronic systolic heart failure ([place])     a. ECHO (04/2014) EF 20-25%, diff HK, mild MR  . Atrial fibrillation ([place])   . CHF (congestive heart failure) ([place])     Current Outpatient Prescriptions  Medication Sig Dispense Refill  . amiodarone (PACERONE) 200 MG tablet Take 0.5 tablets (100 mg total) by mouth daily. 45 tablet 1  . ELIQUIS 2.5 MG TABS tablet TAKE ONE TABLET BY MOUTH TWICE DAILY 60 tablet 3  . lisinopril (PRINIVIL,ZESTRIL)  2.5 MG tablet TAKE ONE TABLET BY MOUTH ONE TIME DAILY 90 tablet 3  . metoCLOPramide (REGLAN) 10 MG tablet Take 1 tablet (10 mg total) by mouth every 6 (six) hours. 15 tablet 0  . milrinone (PRIMACOR) 20 MG/100ML SOLN infusion Inject 4.95 mcg/min into the vein continuous. Per [facility] 100 mL 3  . Multiple Vitamin (MULTIVITAMIN) tablet Take 1 tablet by mouth daily. 30 tablet 1  . Omega-3 Fatty Acids (FISH OIL) 1000 MG CAPS Take 1 capsule by mouth daily.    Marland Kitchen torsemide (DEMADEX) 20 MG tablet TAKE TWO TABLETS BY MOUTH TWICE DAILY 120 tablet 3   No current facility-administered medications for this encounter.    Filed Vitals:   01/11/16 1542  BP: 128/76  Pulse: 78  Weight: 95 lb (43.092 kg)  SpO2: 93%    PHYSICAL EXAM: General:  Elderly appearing. Frail, No resp difficulty; Son and husband  present. Ambulated in the clinic with a cane. NAD.  HEENT: normal Neck: supple. JVP 5-6  Carotids 2+ bilaterally; no bruits. No lymphadenopathy or thryomegaly appreciated. Cor: PMI normal. Regular rate & rhythm. No rubs, gallops.  2/6 SEM RUSB with clear S2. Lungs: clear Abdomen: soft, nontender, nondistended. No hepatosplenomegaly. No bruits or masses. Good bowel sounds. Extremities: no cyanosis, clubbing, rash, edema  RUE PICC site clear Neuro: alert & orientedx3, cranial nerves grossly intact. Moves all 4 extremities w/o difficulty. Affect pleasant.  ASSESSMENT & PLAN:   1) Chronic Systolic Heart Failure: Mixed ischemic/nonischemic cardiomyopathy (degree of LV dysfunction is out of proportion to CAD), EF 25% with moderate RV systolic dysfunction .   Stable NYHA II symptoms and volume status stable on milrinone. - Continue current milrinone gtt. - Continue torsemide 40 mg bid.  - Continue lisinopril 2.5 mg daily.   - Not on beta blocker with low output.   2) Atrial fibrillation: Paroxysmal.  Regular rhythm. Continue apixaban 2.5 mg bid (weight less than 60 kg and age >54). Continue  amiodarone 100 mg daily.   3) Limited Code: No CPR or defibrillation 4) CAD: No chest pain. Off ASA with Eliquis.  5) Anemia: Hemoglobin ok from recent lab work.  6) Recent PICC line infection -  Resolved. 7) Impaired memory-  Per PCP. Needs follow up.    She is very difficult to manage due to poor insight and memory impairment. Hard to know which medications she is taking. Follow up 4-6 weeks. Next visit consider [facility] referral.   Jozeph Persing NP-C  01/11/2016

## 2016-01-11 NOTE — Patient Instructions (Addendum)
Your physician recommends that you schedule a follow-up appointment in: [place]  Do the following things EVERYDAY: 1) Weigh yourself in the morning before breakfast. Write it down and keep it in a log. 2) Take your medicines as prescribed 3) Eat low salt foods-Limit salt (sodium) to 2000 mg per day.  4) Stay as active as you can everyday 5) Limit all fluids for the day to less than 2 liters 6)

## 2016-01-12 ENCOUNTER — Other Ambulatory Visit (HOSPITAL_COMMUNITY): Payer: Self-pay | Admitting: Internal Medicine

## 2016-01-14 ENCOUNTER — Telehealth: Payer: Self-pay | Admitting: Physician Assistant

## 2016-01-14 NOTE — Telephone Encounter (Signed)
Jacqueline Reilly is a 82 y.o. female with CAD, systolic CHF followed in the HF Clinic. Received a call today regarding URI symptoms. No DPR on file. When I called the patient, I was not allowed to speak with her because her husband continued to speak on the same line as the patient.  I tried several times to speak to [employer] who was also on the line.  Her husband would not allow me to ask her any questions.  I tried several times to get [employer] preference of whether she should speak with me or if she wanted me to speak with her family members.  I explained to the patient and her husband the need to have a DPR on file to speak to anyone other than the patient.  The patient's husband would not stop talking long enough for me to speak to the patient.  I eventually hung up the phone.  I tried to call back several times. The line was busy each.  Given the nature of what transpired on the phone, I am concerned about Jacqueline Reilly's safety.  I was never able to confirm that she felt safe in her current environment. I have called 911 and asked the [facility] to go and check on the patient. I have left a message with the police to notify the patient to call me back today so I can address her concerns. Richardson Dopp, PA-C   01/14/2016 3:14 PM

## 2016-01-17 DIAGNOSIS — I5022 Chronic systolic (congestive) heart failure: Secondary | ICD-10-CM | POA: Diagnosis not present

## 2016-01-17 DIAGNOSIS — Z452 Encounter for adjustment and management of vascular access device: Secondary | ICD-10-CM | POA: Diagnosis not present

## 2016-01-17 DIAGNOSIS — I110 Hypertensive heart disease with heart failure: Secondary | ICD-10-CM | POA: Diagnosis not present

## 2016-01-17 DIAGNOSIS — J209 Acute bronchitis, unspecified: Secondary | ICD-10-CM | POA: Diagnosis not present

## 2016-01-17 DIAGNOSIS — F0390 Unspecified dementia without behavioral disturbance: Secondary | ICD-10-CM | POA: Diagnosis not present

## 2016-01-17 DIAGNOSIS — I480 Paroxysmal atrial fibrillation: Secondary | ICD-10-CM | POA: Diagnosis not present

## 2016-01-17 DIAGNOSIS — I2510 Atherosclerotic heart disease of native coronary artery without angina pectoris: Secondary | ICD-10-CM | POA: Diagnosis not present

## 2016-01-19 ENCOUNTER — Telehealth (HOSPITAL_COMMUNITY): Payer: Self-pay | Admitting: Cardiology

## 2016-01-19 ENCOUNTER — Other Ambulatory Visit (HOSPITAL_COMMUNITY): Payer: Self-pay | Admitting: Internal Medicine

## 2016-01-19 NOTE — Telephone Encounter (Signed)
Patients brother called concerned with abx and cough suppressant given by PCP on 3/22 Seen by PCP and was treated for bronchitis- cephalexin  500 mg x 10 days and tessalon pearls TID x 10 days  Medications reviewed with CHF pharmacy team And patient is ok to continue therapy given by PCP

## 2016-01-24 DIAGNOSIS — Z452 Encounter for adjustment and management of vascular access device: Secondary | ICD-10-CM | POA: Diagnosis not present

## 2016-01-24 DIAGNOSIS — I2510 Atherosclerotic heart disease of native coronary artery without angina pectoris: Secondary | ICD-10-CM | POA: Diagnosis not present

## 2016-01-24 DIAGNOSIS — I110 Hypertensive heart disease with heart failure: Secondary | ICD-10-CM | POA: Diagnosis not present

## 2016-01-24 DIAGNOSIS — F0390 Unspecified dementia without behavioral disturbance: Secondary | ICD-10-CM | POA: Diagnosis not present

## 2016-01-24 DIAGNOSIS — I480 Paroxysmal atrial fibrillation: Secondary | ICD-10-CM | POA: Diagnosis not present

## 2016-01-24 DIAGNOSIS — I5022 Chronic systolic (congestive) heart failure: Secondary | ICD-10-CM | POA: Diagnosis not present

## 2016-01-31 DIAGNOSIS — Z452 Encounter for adjustment and management of vascular access device: Secondary | ICD-10-CM | POA: Diagnosis not present

## 2016-01-31 DIAGNOSIS — I110 Hypertensive heart disease with heart failure: Secondary | ICD-10-CM | POA: Diagnosis not present

## 2016-01-31 DIAGNOSIS — I2510 Atherosclerotic heart disease of native coronary artery without angina pectoris: Secondary | ICD-10-CM | POA: Diagnosis not present

## 2016-01-31 DIAGNOSIS — I80229 Phlebitis and thrombophlebitis of unspecified popliteal vein: Secondary | ICD-10-CM | POA: Diagnosis not present

## 2016-01-31 DIAGNOSIS — F0390 Unspecified dementia without behavioral disturbance: Secondary | ICD-10-CM | POA: Diagnosis not present

## 2016-01-31 DIAGNOSIS — I480 Paroxysmal atrial fibrillation: Secondary | ICD-10-CM | POA: Diagnosis not present

## 2016-01-31 DIAGNOSIS — I5022 Chronic systolic (congestive) heart failure: Secondary | ICD-10-CM | POA: Diagnosis not present

## 2016-02-02 ENCOUNTER — Other Ambulatory Visit (HOSPITAL_COMMUNITY): Payer: Self-pay | Admitting: Internal Medicine

## 2016-02-02 ENCOUNTER — Telehealth (HOSPITAL_COMMUNITY): Payer: Self-pay | Admitting: *Deleted

## 2016-02-02 NOTE — Telephone Encounter (Signed)
Pt's husband left a VM earlier wanting to know if she should continue her "antiviral medication" he stated in his message that the rx had ran up and he feels she needs to be on it.  Unsure what he is referring to, have attempted to call back and got no answer twice

## 2016-02-07 DIAGNOSIS — I2510 Atherosclerotic heart disease of native coronary artery without angina pectoris: Secondary | ICD-10-CM | POA: Diagnosis not present

## 2016-02-07 DIAGNOSIS — Z452 Encounter for adjustment and management of vascular access device: Secondary | ICD-10-CM | POA: Diagnosis not present

## 2016-02-07 DIAGNOSIS — I5022 Chronic systolic (congestive) heart failure: Secondary | ICD-10-CM | POA: Diagnosis not present

## 2016-02-07 DIAGNOSIS — F0390 Unspecified dementia without behavioral disturbance: Secondary | ICD-10-CM | POA: Diagnosis not present

## 2016-02-07 DIAGNOSIS — I110 Hypertensive heart disease with heart failure: Secondary | ICD-10-CM | POA: Diagnosis not present

## 2016-02-07 DIAGNOSIS — I480 Paroxysmal atrial fibrillation: Secondary | ICD-10-CM | POA: Diagnosis not present

## 2016-02-08 ENCOUNTER — Telehealth (HOSPITAL_COMMUNITY): Payer: Self-pay | Admitting: *Deleted

## 2016-02-14 ENCOUNTER — Telehealth: Payer: Self-pay | Admitting: Internal Medicine

## 2016-02-14 DIAGNOSIS — I480 Paroxysmal atrial fibrillation: Secondary | ICD-10-CM | POA: Diagnosis not present

## 2016-02-14 DIAGNOSIS — F0390 Unspecified dementia without behavioral disturbance: Secondary | ICD-10-CM | POA: Diagnosis not present

## 2016-02-14 DIAGNOSIS — I2510 Atherosclerotic heart disease of native coronary artery without angina pectoris: Secondary | ICD-10-CM | POA: Diagnosis not present

## 2016-02-14 DIAGNOSIS — Z452 Encounter for adjustment and management of vascular access device: Secondary | ICD-10-CM | POA: Diagnosis not present

## 2016-02-14 DIAGNOSIS — I110 Hypertensive heart disease with heart failure: Secondary | ICD-10-CM | POA: Diagnosis not present

## 2016-02-14 DIAGNOSIS — I5022 Chronic systolic (congestive) heart failure: Secondary | ICD-10-CM | POA: Diagnosis not present

## 2016-02-14 NOTE — Telephone Encounter (Signed)
Jacqueline Reilly is a 82 yo woman with CAD and systolic heart failure.   Called by [name] through paging system given concern about his wife. However, when I called back at 11:2x, he was speaking in grandiose language and he would not let his wife speak on the phone. I said that he should call 911 given his concern about his wife and then he began speaking in circuitous language and he ultimately hung up the telephone on me. I called 911 and explained the situation and they said they had a vehicle already going to the home to check on them so I thanked them.   Jules Husbands, MD

## 2016-02-16 ENCOUNTER — Other Ambulatory Visit (HOSPITAL_COMMUNITY): Payer: Self-pay | Admitting: Internal Medicine

## 2016-02-19 DIAGNOSIS — Z452 Encounter for adjustment and management of vascular access device: Secondary | ICD-10-CM | POA: Diagnosis not present

## 2016-02-19 DIAGNOSIS — I5022 Chronic systolic (congestive) heart failure: Secondary | ICD-10-CM | POA: Diagnosis not present

## 2016-02-19 DIAGNOSIS — I480 Paroxysmal atrial fibrillation: Secondary | ICD-10-CM | POA: Diagnosis not present

## 2016-02-19 DIAGNOSIS — I2510 Atherosclerotic heart disease of native coronary artery without angina pectoris: Secondary | ICD-10-CM | POA: Diagnosis not present

## 2016-02-19 DIAGNOSIS — I110 Hypertensive heart disease with heart failure: Secondary | ICD-10-CM | POA: Diagnosis not present

## 2016-02-19 DIAGNOSIS — F0390 Unspecified dementia without behavioral disturbance: Secondary | ICD-10-CM | POA: Diagnosis not present

## 2016-02-21 DIAGNOSIS — I110 Hypertensive heart disease with heart failure: Secondary | ICD-10-CM | POA: Diagnosis not present

## 2016-02-21 DIAGNOSIS — I480 Paroxysmal atrial fibrillation: Secondary | ICD-10-CM | POA: Diagnosis not present

## 2016-02-21 DIAGNOSIS — I5022 Chronic systolic (congestive) heart failure: Secondary | ICD-10-CM | POA: Diagnosis not present

## 2016-02-21 DIAGNOSIS — Z7901 Long term (current) use of anticoagulants: Secondary | ICD-10-CM | POA: Diagnosis not present

## 2016-02-21 DIAGNOSIS — Z452 Encounter for adjustment and management of vascular access device: Secondary | ICD-10-CM | POA: Diagnosis not present

## 2016-02-21 DIAGNOSIS — I714 Abdominal aortic aneurysm, without rupture: Secondary | ICD-10-CM | POA: Diagnosis not present

## 2016-02-21 DIAGNOSIS — Z79899 Other long term (current) drug therapy: Secondary | ICD-10-CM | POA: Diagnosis not present

## 2016-02-21 DIAGNOSIS — F0390 Unspecified dementia without behavioral disturbance: Secondary | ICD-10-CM | POA: Diagnosis not present

## 2016-02-21 DIAGNOSIS — D649 Anemia, unspecified: Secondary | ICD-10-CM | POA: Diagnosis not present

## 2016-02-21 DIAGNOSIS — I252 Old myocardial infarction: Secondary | ICD-10-CM | POA: Diagnosis not present

## 2016-02-21 DIAGNOSIS — I2510 Atherosclerotic heart disease of native coronary artery without angina pectoris: Secondary | ICD-10-CM | POA: Diagnosis not present

## 2016-02-26 ENCOUNTER — Ambulatory Visit (HOSPITAL_COMMUNITY)
Admission: RE | Admit: 2016-02-26 | Discharge: 2016-02-26 | Disposition: A | Discharge status: Home / Self Care | Payer: Medicare Other | Source: Ambulatory Visit | Attending: Internal Medicine | Admitting: Internal Medicine

## 2016-02-26 VITALS — BP 118/54 | HR 81 | Wt 93.5 lb

## 2016-02-26 DIAGNOSIS — Z955 Presence of coronary angioplasty implant and graft: Secondary | ICD-10-CM | POA: Diagnosis not present

## 2016-02-26 DIAGNOSIS — E785 Hyperlipidemia, unspecified: Secondary | ICD-10-CM | POA: Diagnosis not present

## 2016-02-26 DIAGNOSIS — D649 Anemia, unspecified: Secondary | ICD-10-CM | POA: Diagnosis not present

## 2016-02-26 DIAGNOSIS — I739 Peripheral vascular disease, unspecified: Secondary | ICD-10-CM | POA: Insufficient documentation

## 2016-02-26 DIAGNOSIS — Z7901 Long term (current) use of anticoagulants: Secondary | ICD-10-CM | POA: Diagnosis not present

## 2016-02-26 DIAGNOSIS — I255 Ischemic cardiomyopathy: Secondary | ICD-10-CM | POA: Diagnosis not present

## 2016-02-26 DIAGNOSIS — Z79899 Other long term (current) drug therapy: Secondary | ICD-10-CM | POA: Diagnosis not present

## 2016-02-26 DIAGNOSIS — F0390 Unspecified dementia without behavioral disturbance: Secondary | ICD-10-CM | POA: Diagnosis not present

## 2016-02-26 DIAGNOSIS — I110 Hypertensive heart disease with heart failure: Secondary | ICD-10-CM | POA: Insufficient documentation

## 2016-02-26 DIAGNOSIS — I480 Paroxysmal atrial fibrillation: Secondary | ICD-10-CM | POA: Diagnosis not present

## 2016-02-26 DIAGNOSIS — I2510 Atherosclerotic heart disease of native coronary artery without angina pectoris: Secondary | ICD-10-CM | POA: Insufficient documentation

## 2016-02-26 DIAGNOSIS — I5022 Chronic systolic (congestive) heart failure: Secondary | ICD-10-CM | POA: Diagnosis not present

## 2016-02-26 DIAGNOSIS — I428 Other cardiomyopathies: Secondary | ICD-10-CM | POA: Insufficient documentation

## 2016-02-26 DIAGNOSIS — Z8249 Family history of ischemic heart disease and other diseases of the circulatory system: Secondary | ICD-10-CM | POA: Insufficient documentation

## 2016-02-26 DIAGNOSIS — I714 Abdominal aortic aneurysm, without rupture: Secondary | ICD-10-CM | POA: Diagnosis not present

## 2016-02-26 NOTE — Addendum Note (Signed)
Encounter addended by: Scarlette Calico, RN on: 02/26/2016  3:39 PM<BR>     Documentation filed: Patient Instructions Section

## 2016-02-26 NOTE — Patient Instructions (Signed)
We will contact you in 3-4 months to schedule your next appointment.  

## 2016-02-26 NOTE — Progress Notes (Signed)
Advanced Heart Failure Medication Review by a Pharmacist  Does the patient  feel that his/her medications are working for him/her?  yes  Has the patient been experiencing any side effects to the medications prescribed?  no  Does the patient measure his/her own blood pressure or blood glucose at home?  no   Does the patient have any problems obtaining medications due to transportation or finances?   no  Understanding of regimen: poor Understanding of indications: poor Potential of compliance: fair Patient understands to avoid NSAIDs. Patient understands to avoid decongestants.  Issues to address at subsequent visits: None   Pharmacist comments:  Mrs. Schenk is a 82 yo F presenting with her brother and husband. They report good compliance with her regimen but were unable to tell me which medications she was taking currently. I did call [facility] who verified each of her medications and refills were in date.   Ruta Hinds. Velva Harman, PharmD, BCPS, CPP Clinical Pharmacist Pager: [phone] Phone: [phone] 02/26/2016 2:49 PM      Time with patient: 4 minutes Preparation and documentation time: 8 minutes Total time: 12 minutes

## 2016-02-26 NOTE — Progress Notes (Signed)
Patient ID: [name], female   DOB: 1924/02/01, 82 y.o.   MRN: [id number]   ADVANCED HF CLINIC NOTE  Patient ID: Jacqueline Reilly, female   DOB: 05-25-1924, 82 y.o.   MRN: [id number] PCP: [name] CHF: Shyhiem Beeney  HPI: Jacqueline Reilly) is an 82 y/o woman with CAD, AAA, systolic HF, mild dementia, afib and CAD. She has had two previous coronary interventions including a cutting balloon to her D2 in 2007 and a DES to her mid LCX in 2010. Her LV dysfunction has been out of proportion to her CAD.  Last echo in 7/15 showed EF 20-25% with severe LV dilation, mild MR, and PA systolic pressure 38 mmHg.   Admitted 7/11-7/24/15 with syncope and dyspnea. She had new onset A-fib/RVR. Troponin was 1.03> 1.59> 2.18 . She was placed on amiodarone and heparin drip. She developed respiratory distress and required intubation. She converted to NSR but later was bradycardiac with NSVT. Amiodarone temporarily stopped but restarted after hyperkalemia resolved. Hyperkalemia was thought to be contributing to bradycardia. Diuresed with IV lasix. Co-ox 29% and started on milrinone which we tried to wean off but she did not tolerate. Discharge weight 87 lbs.   Admitted in November for Gram-negative bacteremia ([place]) - enterobacter asburiae from PICC. Treated with ceftriaxone and then switched to oral levofloxacin for a total of 21 weeks and PICC replaced.   She returns today for HF follow up with her son and husband. Remains on milrinone. Overall feeling ok. Denies SOB/PND/Orthonea. No BRBPR. She walks with a cane. Her brother is concerned that her husband is not giving her all the medications on a regular basis and is not charting accurately in their logbook. That said, pill count seems to be about 80-85% accurate. Weight stable. Not swelling. No probable PICC line.     Echo 8/16  EF 25% with diffuse hypokinesis, mild AS/mild AI, RV mildly dilated with moderately decreased systolic function.   Labs: (05/23/14) K+  4.3, creatinine 1.06, digoxin 1.1 (8/15) K 4.7, creatinine 0.94, HCT 33.2 (9/15) K 3.4, creatinine .80 (10/1/0/2015) K 5.0 Creatinine 0.87 Magnesium 2.4  08/11/14 K 5.3 Creatinine 0.8 08/25/14 K 4.4 Cr 0.85 09/04/14   K 4.0 Cr 0.8 pBNP 1763 11/02/14  K 4.2 Cr 0.85 3/16 HCT 32.6, K 4.2, creatinine 0.82 03/03/15  K 3.1 Cr 0.99 BNP 249 8/16 HCT 29.4, K 4.6, creatinine 0.93 07/26/15 Hgb 9.7 K 4.6 creatinine 0.86 02/14/16 Hgb 10.9 K 5.2 creatinine 0.88  ROS: All systems negative except as listed in HPI, PMH and Problem List.  SH:  Social History   Social History  . Marital Status: Married    Spouse Name: N/A  . Number of Children: 1  . Years of Education: N/A   Occupational History  . retired    Social History Main Topics  . Smoking status: Never Smoker   . Smokeless tobacco: Never Used  . Alcohol Use: Yes     Comment: occasionally  . Drug Use: No  . Sexual Activity: Not Currently   Other Topics Concern  . Not on file   Social History Narrative   She lives in [place], family helps with her care.    FH:  Family History  Problem Relation Age of Onset  . Heart disease Mother   . Heart disease Father   . Heart disease Brother     x 3    Past Medical History  Diagnosis Date  . PVD (peripheral vascular disease) ([place])   .  Ventricular dysfunction     left; ischemic  . Hyperlipidemia   . AAA (abdominal aortic aneurysm) ([place])   . Renal artery stenosis ([place])   . Hypertension   . Coronary artery disease 2007    moderate ASCAD of the left system s/p PCI of the D2 and PCI of the left circ 03/2009  . Chronic systolic heart failure ([place])     a. ECHO (04/2014) EF 20-25%, diff HK, mild MR  . Atrial fibrillation ([place])   . CHF (congestive heart failure) ([place])     Current Outpatient Prescriptions  Medication Sig Dispense Refill  . amiodarone (PACERONE) 200 MG tablet Take 0.5 tablets (100 mg total) by mouth daily. 45 tablet 1  . ELIQUIS 2.5 MG TABS tablet TAKE ONE TABLET BY  MOUTH TWICE DAILY 60 tablet 3  . lisinopril (PRINIVIL,ZESTRIL) 2.5 MG tablet TAKE ONE TABLET BY MOUTH ONE TIME DAILY 90 tablet 3  . milrinone (PRIMACOR) 20 MG/100ML SOLN infusion Inject 4.95 mcg/min into the vein continuous. Per [facility] 100 mL 3  . Multiple Vitamin (MULTIVITAMIN) tablet Take 1 tablet by mouth daily. 30 tablet 1  . Omega-3 Fatty Acids (FISH OIL) 1000 MG CAPS Take 1 capsule by mouth daily.    Marland Kitchen torsemide (DEMADEX) 20 MG tablet TAKE TWO TABLETS BY MOUTH TWICE DAILY 120 tablet 3  . benzonatate (TESSALON) 200 MG capsule Take 200 mg by mouth 3 (three) times daily as needed for cough. Reported on 02/26/2016     No current facility-administered medications for this encounter.    Filed Vitals:   02/26/16 1431  BP: 118/54  Pulse: 81  Weight: 93 lb 8 oz (42.411 kg)  SpO2: 91%    PHYSICAL EXAM: General:  Elderly appearing. Frail, No resp difficulty; Son present. Ambulated in the clinic with a cane. NAD.  HEENT: normal Neck: supple. JVP 6-7 tids 2+ bilaterally; no bruits. No lymphadenopathy or thryomegaly appreciated. Cor: PMI normal. Regular rate & rhythm. No rubs, gallops.  2/6 SEM RUSB with clear S2. Lungs: clear Abdomen: soft, nontender, nondistended. No hepatosplenomegaly. No bruits or masses. Good bowel sounds. Extremities: no cyanosis, clubbing, rash, edema  RUE PICC site clear Neuro: alert & orientedx3, cranial nerves grossly intact. Moves all 4 extremities w/o difficulty. Affect pleasant.  ASSESSMENT & PLAN:   1) Chronic Systolic Heart Failure: Mixed ischemic/nonischemic cardiomyopathy (degree of LV dysfunction is out of proportion to CAD), EF 25% with moderate RV systolic dysfunction .   Stable NYHA II symptoms and volume status stable on milrinone. There does seem to be some problems with her husband getting her medications but she remains quite stable so probably not ideal but sufficient. Brother will continue to follow pill counts and report to [place] if they are  way off.  - Continue current milrinone gtt. Labs ok.  - Continue torsemide 40 mg bid.  - Continue lisinopril 2.5 mg daily.   - Not on beta blocker with low output.   2) Atrial fibrillation: Paroxysmal.  Regular rhythm. Continue apixaban 2.5 mg bid (weight less than 60 kg and age >48). Continue amiodarone 100 mg daily.   3) Limited Code: No CPR or defibrillation 4) CAD: No chest pain. Off ASA with Eliquis.  5) Anemia: Hemoglobin ok from recent lab work.  6) PICC line infection -  Resolved. 7) Impaired memory-  Per PCP. Stable.     Glori Bickers MD  02/26/2016

## 2016-02-28 ENCOUNTER — Emergency Department (HOSPITAL_COMMUNITY)
Admission: EM | Admit: 2016-02-28 | Discharge: 2016-02-28 | Disposition: A | Discharge status: Home / Self Care | Payer: Medicare Other | Attending: Emergency Medicine | Admitting: Emergency Medicine

## 2016-02-28 ENCOUNTER — Encounter (HOSPITAL_COMMUNITY): Payer: Self-pay | Admitting: Emergency Medicine

## 2016-02-28 DIAGNOSIS — I5022 Chronic systolic (congestive) heart failure: Secondary | ICD-10-CM | POA: Diagnosis not present

## 2016-02-28 DIAGNOSIS — Z79899 Other long term (current) drug therapy: Secondary | ICD-10-CM | POA: Diagnosis not present

## 2016-02-28 DIAGNOSIS — F0390 Unspecified dementia without behavioral disturbance: Secondary | ICD-10-CM | POA: Diagnosis not present

## 2016-02-28 DIAGNOSIS — Z9861 Coronary angioplasty status: Secondary | ICD-10-CM | POA: Diagnosis not present

## 2016-02-28 DIAGNOSIS — I2510 Atherosclerotic heart disease of native coronary artery without angina pectoris: Secondary | ICD-10-CM | POA: Diagnosis not present

## 2016-02-28 DIAGNOSIS — Z5181 Encounter for therapeutic drug level monitoring: Secondary | ICD-10-CM | POA: Diagnosis not present

## 2016-02-28 DIAGNOSIS — I10 Essential (primary) hypertension: Secondary | ICD-10-CM | POA: Diagnosis not present

## 2016-02-28 DIAGNOSIS — E785 Hyperlipidemia, unspecified: Secondary | ICD-10-CM | POA: Diagnosis not present

## 2016-02-28 DIAGNOSIS — I480 Paroxysmal atrial fibrillation: Secondary | ICD-10-CM | POA: Diagnosis not present

## 2016-02-28 DIAGNOSIS — Z760 Encounter for issue of repeat prescription: Secondary | ICD-10-CM | POA: Diagnosis not present

## 2016-02-28 DIAGNOSIS — Z452 Encounter for adjustment and management of vascular access device: Secondary | ICD-10-CM | POA: Diagnosis not present

## 2016-02-28 DIAGNOSIS — I110 Hypertensive heart disease with heart failure: Secondary | ICD-10-CM | POA: Diagnosis not present

## 2016-02-28 NOTE — Discharge Instructions (Signed)
1. Medications: usual home medications 2. Treatment: rest 3. Follow Up: please followup with your primary doctor for discussion of your diagnoses and further evaluation after today's visit; if you do not have a primary care doctor use the phone number listed in your discharge paperwork to find one; please return to the ER for new or worsening symptoms

## 2016-02-28 NOTE — ED Notes (Signed)
Pt here with IV infusion of milrinone and having issues with pump beeping

## 2016-02-28 NOTE — ED Notes (Signed)
[facility] number if needed  [phone]

## 2016-02-28 NOTE — ED Provider Notes (Signed)
CSN: [id number]     Arrival date & time 02/28/16  1741 History   First MD Initiated Contact with Patient 02/28/16 1906     Chief Complaint  Patient presents with  . IV med issue     HPI   Jacqueline Reilly is a 82 y.o. female with a PMH of HLD, HTN, CAD, CHf, atrial fibrillation who presents to the ED with issues with her milrinone bump. She states it started beeping several hours ago, prompting her to come to the ED. She denies exacerbating or alleviating factors. Otherwise, she denies complaints.   Past Medical History  Diagnosis Date  . PVD (peripheral vascular disease) ([place])   . Ventricular dysfunction     left; ischemic  . Hyperlipidemia   . AAA (abdominal aortic aneurysm) ([place])   . Renal artery stenosis ([place])   . Hypertension   . Coronary artery disease 2007    moderate ASCAD of the left system s/p PCI of the D2 and PCI of the left circ 03/2009  . Chronic systolic heart failure ([place])     a. ECHO (04/2014) EF 20-25%, diff HK, mild MR  . Atrial fibrillation ([place])   . CHF (congestive heart failure) [facility])    Past Surgical History  Procedure Laterality Date  . Retinal cryopexy      right eye (for retinal detachment)  . Angioplasty    . Coronary stent placement     Family History  Problem Relation Age of Onset  . Heart disease Mother   . Heart disease Father   . Heart disease Brother     x 3   Social History  Substance Use Topics  . Smoking status: Never Smoker   . Smokeless tobacco: Never Used  . Alcohol Use: Yes     Comment: occasionally   OB History    No data available      Review of Systems  Constitutional: Negative for fever and chills.  Respiratory: Negative for shortness of breath.   Cardiovascular: Negative for chest pain.  All other systems reviewed and are negative.     Allergies  Codeine and Garlic  Home Medications   Prior to Admission medications   Medication Sig Start Date End Date Taking? Authorizing Provider  amiodarone (PACERONE) 200  MG tablet Take 0.5 tablets (100 mg total) by mouth daily. 11/29/15   Jolaine Artist, MD  benzonatate (TESSALON) 200 MG capsule Take 200 mg by mouth 3 (three) times daily as needed for cough. Reported on 02/26/2016    Historical Provider, MD  ELIQUIS 2.5 MG TABS tablet TAKE ONE TABLET BY MOUTH TWICE DAILY 12/19/15   Larey Dresser, MD  lisinopril (PRINIVIL,ZESTRIL) 2.5 MG tablet TAKE ONE TABLET BY MOUTH ONE TIME DAILY 11/27/15   Jolaine Artist, MD  milrinone [facility]) 20 MG/100ML SOLN infusion Inject 4.95 mcg/min into the vein continuous. Per [facility] 05/20/14   Evelene Croon Barrett, PA-C  Multiple Vitamin (MULTIVITAMIN) tablet Take 1 tablet by mouth daily. 07/26/14   Larey Dresser, MD  Omega-3 Fatty Acids (FISH OIL) 1000 MG CAPS Take 1 capsule by mouth daily.    Historical Provider, MD  torsemide (DEMADEX) 20 MG tablet TAKE TWO TABLETS BY MOUTH TWICE DAILY 11/21/15   Jolaine Artist, MD    BP 112/62 mmHg  Pulse 71  Temp(Src) 97.8 F (36.6 C) (Oral)  Resp 18  SpO2 100% Physical Exam  Constitutional: She is oriented to person, place, and time. She appears well-developed and  well-nourished. No distress.  HENT:  Head: Normocephalic and atraumatic.  Right Ear: External ear normal.  Left Ear: External ear normal.  Nose: Nose normal.  Mouth/Throat: Uvula is midline, oropharynx is clear and moist and mucous membranes are normal.  Eyes: Conjunctivae, EOM and lids are normal. Pupils are equal, round, and reactive to light. Right eye exhibits no discharge. Left eye exhibits no discharge. No scleral icterus.  Neck: Normal range of motion. Neck supple.  Cardiovascular: Normal rate, regular rhythm, normal heart sounds, intact distal pulses and normal pulses.   Pulmonary/Chest: Effort normal and breath sounds normal. No respiratory distress. She has no wheezes. She has no rales.  Abdominal: Soft. Normal appearance and bowel sounds are normal. She exhibits no distension and no mass. There is  no tenderness. There is no rigidity, no rebound and no guarding.  Musculoskeletal: Normal range of motion. She exhibits no edema or tenderness.  Neurological: She is alert and oriented to person, place, and time. She has normal strength. No cranial nerve deficit or sensory deficit.  Skin: Skin is warm, dry and intact. No rash noted. She is not diaphoretic. No erythema. No pallor.  Psychiatric: She has a normal mood and affect. Her speech is normal and behavior is normal.  Nursing note and vitals reviewed.   ED Course  Procedures (including critical care time)  Labs Review Labs Reviewed - No data to display  Imaging Review No results found.    EKG Interpretation None      MDM   Final diagnoses:  Medication management    82 year old female presents due to her milrinone pump beeping at home prior to arrival. Denies additional complaints. Patient is afebrile with stable vital signs. IV team consulted in triage, who assessed her home pump, which was noted to be infusing without difficulty. PICC WNL and flushed without difficulty. Patient discussed with Dr. Johnney Killian. She is non-toxic and well-appearing and is requesting to be discharged home. Patient to follow-up with PCP. Return precautions discussed. Patient verbalizes her understanding and is in agreement with plan.  BP 112/62 mmHg  Pulse 71  Temp(Src) 97.8 F (36.6 C) (Oral)  Resp 18  SpO2 100%      Marella Chimes, PA-C 02/28/16 1950  Charlesetta Shanks, MD 03/07/16 [phone]

## 2016-02-28 NOTE — Progress Notes (Signed)
Assessed home pump for milrinone.  Pump infusing without difficulty.  PICC WNL.  Flushed with 10cc NS without difficulty.

## 2016-03-01 ENCOUNTER — Other Ambulatory Visit (HOSPITAL_COMMUNITY): Payer: Self-pay | Admitting: Internal Medicine

## 2016-03-06 DIAGNOSIS — Z452 Encounter for adjustment and management of vascular access device: Secondary | ICD-10-CM | POA: Diagnosis not present

## 2016-03-06 DIAGNOSIS — I480 Paroxysmal atrial fibrillation: Secondary | ICD-10-CM | POA: Diagnosis not present

## 2016-03-06 DIAGNOSIS — F0390 Unspecified dementia without behavioral disturbance: Secondary | ICD-10-CM | POA: Diagnosis not present

## 2016-03-06 DIAGNOSIS — I2510 Atherosclerotic heart disease of native coronary artery without angina pectoris: Secondary | ICD-10-CM | POA: Diagnosis not present

## 2016-03-06 DIAGNOSIS — I5022 Chronic systolic (congestive) heart failure: Secondary | ICD-10-CM | POA: Diagnosis not present

## 2016-03-06 DIAGNOSIS — I110 Hypertensive heart disease with heart failure: Secondary | ICD-10-CM | POA: Diagnosis not present

## 2016-03-08 ENCOUNTER — Other Ambulatory Visit (HOSPITAL_COMMUNITY): Payer: Self-pay | Admitting: Internal Medicine

## 2016-03-13 DIAGNOSIS — I480 Paroxysmal atrial fibrillation: Secondary | ICD-10-CM | POA: Diagnosis not present

## 2016-03-13 DIAGNOSIS — F0390 Unspecified dementia without behavioral disturbance: Secondary | ICD-10-CM | POA: Diagnosis not present

## 2016-03-13 DIAGNOSIS — Z452 Encounter for adjustment and management of vascular access device: Secondary | ICD-10-CM | POA: Diagnosis not present

## 2016-03-13 DIAGNOSIS — I5022 Chronic systolic (congestive) heart failure: Secondary | ICD-10-CM | POA: Diagnosis not present

## 2016-03-13 DIAGNOSIS — I110 Hypertensive heart disease with heart failure: Secondary | ICD-10-CM | POA: Diagnosis not present

## 2016-03-13 DIAGNOSIS — I2510 Atherosclerotic heart disease of native coronary artery without angina pectoris: Secondary | ICD-10-CM | POA: Diagnosis not present

## 2016-03-20 ENCOUNTER — Telehealth (HOSPITAL_COMMUNITY): Payer: Self-pay | Admitting: Surgery

## 2016-03-20 DIAGNOSIS — I110 Hypertensive heart disease with heart failure: Secondary | ICD-10-CM | POA: Diagnosis not present

## 2016-03-20 DIAGNOSIS — I480 Paroxysmal atrial fibrillation: Secondary | ICD-10-CM | POA: Diagnosis not present

## 2016-03-20 DIAGNOSIS — I5022 Chronic systolic (congestive) heart failure: Secondary | ICD-10-CM | POA: Diagnosis not present

## 2016-03-20 DIAGNOSIS — I2510 Atherosclerotic heart disease of native coronary artery without angina pectoris: Secondary | ICD-10-CM | POA: Diagnosis not present

## 2016-03-20 DIAGNOSIS — Z452 Encounter for adjustment and management of vascular access device: Secondary | ICD-10-CM | POA: Diagnosis not present

## 2016-03-20 DIAGNOSIS — F0390 Unspecified dementia without behavioral disturbance: Secondary | ICD-10-CM | POA: Diagnosis not present

## 2016-03-20 NOTE — Telephone Encounter (Signed)
[name] with [facility] called regarding Ms Sharp next appt in the [facility].  She is not scheduled for recall for an appt until August.  The nurse was also concerned about a piece that broke off of Ms. [employer].  She denies any signs and symptoms of infection related to PICC.   I returned her call and left message regarding upcoming appt..  Will send message to Carolynn Sayers about PICC.

## 2016-03-21 NOTE — Telephone Encounter (Signed)
Spoke w/[facility], pt's wing of hub broke off last week, the nurse secured it w/wing guard and tape, PICC has not migrated out any and site looks good w/no indications of infection.  They just wanted [place] to be aware this had happened.

## 2016-03-27 DIAGNOSIS — I2510 Atherosclerotic heart disease of native coronary artery without angina pectoris: Secondary | ICD-10-CM | POA: Diagnosis not present

## 2016-03-27 DIAGNOSIS — I110 Hypertensive heart disease with heart failure: Secondary | ICD-10-CM | POA: Diagnosis not present

## 2016-03-27 DIAGNOSIS — I480 Paroxysmal atrial fibrillation: Secondary | ICD-10-CM | POA: Diagnosis not present

## 2016-03-27 DIAGNOSIS — F0390 Unspecified dementia without behavioral disturbance: Secondary | ICD-10-CM | POA: Diagnosis not present

## 2016-03-27 DIAGNOSIS — Z452 Encounter for adjustment and management of vascular access device: Secondary | ICD-10-CM | POA: Diagnosis not present

## 2016-03-27 DIAGNOSIS — I5022 Chronic systolic (congestive) heart failure: Secondary | ICD-10-CM | POA: Diagnosis not present

## 2016-04-02 ENCOUNTER — Encounter: Payer: Self-pay | Admitting: Internal Medicine

## 2016-04-02 ENCOUNTER — Other Ambulatory Visit (HOSPITAL_COMMUNITY): Payer: Self-pay | Admitting: Internal Medicine

## 2016-04-03 DIAGNOSIS — I110 Hypertensive heart disease with heart failure: Secondary | ICD-10-CM | POA: Diagnosis not present

## 2016-04-03 DIAGNOSIS — I5022 Chronic systolic (congestive) heart failure: Secondary | ICD-10-CM | POA: Diagnosis not present

## 2016-04-03 DIAGNOSIS — I480 Paroxysmal atrial fibrillation: Secondary | ICD-10-CM | POA: Diagnosis not present

## 2016-04-03 DIAGNOSIS — I2510 Atherosclerotic heart disease of native coronary artery without angina pectoris: Secondary | ICD-10-CM | POA: Diagnosis not present

## 2016-04-03 DIAGNOSIS — F0390 Unspecified dementia without behavioral disturbance: Secondary | ICD-10-CM | POA: Diagnosis not present

## 2016-04-03 DIAGNOSIS — Z452 Encounter for adjustment and management of vascular access device: Secondary | ICD-10-CM | POA: Diagnosis not present

## 2016-04-05 ENCOUNTER — Telehealth (HOSPITAL_COMMUNITY): Payer: Self-pay | Admitting: *Deleted

## 2016-04-05 NOTE — Telephone Encounter (Signed)
Received labs from [facility] drawn 6/7:  K 5.4 Bun 1.08 CR 0.4  Per Oda Kilts, PA pt is not on KCL, recheck labs early next week.  Contact [facility] and faxed order to them.

## 2016-04-08 DIAGNOSIS — I480 Paroxysmal atrial fibrillation: Secondary | ICD-10-CM | POA: Diagnosis not present

## 2016-04-08 DIAGNOSIS — I5022 Chronic systolic (congestive) heart failure: Secondary | ICD-10-CM | POA: Diagnosis not present

## 2016-04-08 DIAGNOSIS — I2510 Atherosclerotic heart disease of native coronary artery without angina pectoris: Secondary | ICD-10-CM | POA: Diagnosis not present

## 2016-04-08 DIAGNOSIS — I110 Hypertensive heart disease with heart failure: Secondary | ICD-10-CM | POA: Diagnosis not present

## 2016-04-08 DIAGNOSIS — Z452 Encounter for adjustment and management of vascular access device: Secondary | ICD-10-CM | POA: Diagnosis not present

## 2016-04-08 DIAGNOSIS — F0390 Unspecified dementia without behavioral disturbance: Secondary | ICD-10-CM | POA: Diagnosis not present

## 2016-04-10 DIAGNOSIS — Z452 Encounter for adjustment and management of vascular access device: Secondary | ICD-10-CM | POA: Diagnosis not present

## 2016-04-10 DIAGNOSIS — I10 Essential (primary) hypertension: Secondary | ICD-10-CM | POA: Diagnosis not present

## 2016-04-10 DIAGNOSIS — F0390 Unspecified dementia without behavioral disturbance: Secondary | ICD-10-CM | POA: Diagnosis not present

## 2016-04-10 DIAGNOSIS — I480 Paroxysmal atrial fibrillation: Secondary | ICD-10-CM | POA: Diagnosis not present

## 2016-04-10 DIAGNOSIS — I110 Hypertensive heart disease with heart failure: Secondary | ICD-10-CM | POA: Diagnosis not present

## 2016-04-10 DIAGNOSIS — I5022 Chronic systolic (congestive) heart failure: Secondary | ICD-10-CM | POA: Diagnosis not present

## 2016-04-10 DIAGNOSIS — I2510 Atherosclerotic heart disease of native coronary artery without angina pectoris: Secondary | ICD-10-CM | POA: Diagnosis not present

## 2016-04-11 ENCOUNTER — Other Ambulatory Visit (HOSPITAL_COMMUNITY): Payer: Self-pay | Admitting: Internal Medicine

## 2016-04-16 ENCOUNTER — Other Ambulatory Visit: Payer: Self-pay | Admitting: Internal Medicine

## 2016-04-16 ENCOUNTER — Other Ambulatory Visit: Payer: Self-pay | Admitting: Cardiology

## 2016-04-17 ENCOUNTER — Telehealth (HOSPITAL_COMMUNITY): Payer: Self-pay

## 2016-04-17 DIAGNOSIS — Z452 Encounter for adjustment and management of vascular access device: Secondary | ICD-10-CM | POA: Diagnosis not present

## 2016-04-17 DIAGNOSIS — I110 Hypertensive heart disease with heart failure: Secondary | ICD-10-CM | POA: Diagnosis not present

## 2016-04-17 DIAGNOSIS — I480 Paroxysmal atrial fibrillation: Secondary | ICD-10-CM | POA: Diagnosis not present

## 2016-04-17 DIAGNOSIS — F0390 Unspecified dementia without behavioral disturbance: Secondary | ICD-10-CM | POA: Diagnosis not present

## 2016-04-17 DIAGNOSIS — I2510 Atherosclerotic heart disease of native coronary artery without angina pectoris: Secondary | ICD-10-CM | POA: Diagnosis not present

## 2016-04-17 DIAGNOSIS — I5022 Chronic systolic (congestive) heart failure: Secondary | ICD-10-CM | POA: Diagnosis not present

## 2016-04-17 NOTE — Telephone Encounter (Signed)
 [facility] RN called to get VO to recert patient. Given per Dr. [name] approval.  Leory Plowman, Guinevere Ferrari

## 2016-04-19 ENCOUNTER — Other Ambulatory Visit (HOSPITAL_COMMUNITY): Payer: Self-pay | Admitting: Internal Medicine

## 2016-04-21 DIAGNOSIS — I5022 Chronic systolic (congestive) heart failure: Secondary | ICD-10-CM | POA: Diagnosis not present

## 2016-04-21 DIAGNOSIS — Z79899 Other long term (current) drug therapy: Secondary | ICD-10-CM | POA: Diagnosis not present

## 2016-04-21 DIAGNOSIS — Z7901 Long term (current) use of anticoagulants: Secondary | ICD-10-CM | POA: Diagnosis not present

## 2016-04-21 DIAGNOSIS — Z452 Encounter for adjustment and management of vascular access device: Secondary | ICD-10-CM | POA: Diagnosis not present

## 2016-04-21 DIAGNOSIS — I252 Old myocardial infarction: Secondary | ICD-10-CM | POA: Diagnosis not present

## 2016-04-21 DIAGNOSIS — I480 Paroxysmal atrial fibrillation: Secondary | ICD-10-CM | POA: Diagnosis not present

## 2016-04-21 DIAGNOSIS — I110 Hypertensive heart disease with heart failure: Secondary | ICD-10-CM | POA: Diagnosis not present

## 2016-04-21 DIAGNOSIS — D649 Anemia, unspecified: Secondary | ICD-10-CM | POA: Diagnosis not present

## 2016-04-21 DIAGNOSIS — I714 Abdominal aortic aneurysm, without rupture: Secondary | ICD-10-CM | POA: Diagnosis not present

## 2016-04-21 DIAGNOSIS — I2510 Atherosclerotic heart disease of native coronary artery without angina pectoris: Secondary | ICD-10-CM | POA: Diagnosis not present

## 2016-04-21 DIAGNOSIS — F0390 Unspecified dementia without behavioral disturbance: Secondary | ICD-10-CM | POA: Diagnosis not present

## 2016-04-24 DIAGNOSIS — Z452 Encounter for adjustment and management of vascular access device: Secondary | ICD-10-CM | POA: Diagnosis not present

## 2016-04-24 DIAGNOSIS — I2510 Atherosclerotic heart disease of native coronary artery without angina pectoris: Secondary | ICD-10-CM | POA: Diagnosis not present

## 2016-04-24 DIAGNOSIS — I480 Paroxysmal atrial fibrillation: Secondary | ICD-10-CM | POA: Diagnosis not present

## 2016-04-24 DIAGNOSIS — I110 Hypertensive heart disease with heart failure: Secondary | ICD-10-CM | POA: Diagnosis not present

## 2016-04-24 DIAGNOSIS — F0390 Unspecified dementia without behavioral disturbance: Secondary | ICD-10-CM | POA: Diagnosis not present

## 2016-04-24 DIAGNOSIS — I5022 Chronic systolic (congestive) heart failure: Secondary | ICD-10-CM | POA: Diagnosis not present

## 2016-04-30 ENCOUNTER — Telehealth: Payer: Self-pay | Admitting: Physician Assistant

## 2016-04-30 DIAGNOSIS — I110 Hypertensive heart disease with heart failure: Secondary | ICD-10-CM | POA: Diagnosis not present

## 2016-04-30 DIAGNOSIS — F0390 Unspecified dementia without behavioral disturbance: Secondary | ICD-10-CM | POA: Diagnosis not present

## 2016-04-30 DIAGNOSIS — I5022 Chronic systolic (congestive) heart failure: Secondary | ICD-10-CM | POA: Diagnosis not present

## 2016-04-30 DIAGNOSIS — I480 Paroxysmal atrial fibrillation: Secondary | ICD-10-CM | POA: Diagnosis not present

## 2016-04-30 DIAGNOSIS — I2510 Atherosclerotic heart disease of native coronary artery without angina pectoris: Secondary | ICD-10-CM | POA: Diagnosis not present

## 2016-04-30 DIAGNOSIS — Z452 Encounter for adjustment and management of vascular access device: Secondary | ICD-10-CM | POA: Diagnosis not present

## 2016-04-30 NOTE — Telephone Encounter (Signed)
[name] called because [name] received her new bag of Milrinone in the mail yesterday and he felt the [facility] should be coming out today to change it and draw blood. He has not heard anything from [place] and wants to know what to do.  Called [facility] and they stated they would get someone out to change the bag and do labs.  Called Mr Klehr back to let him know this, no other issues or concerns.  Lenoard Aden 04/30/2016 5:58 PM Beeper [phone]

## 2016-05-03 ENCOUNTER — Other Ambulatory Visit (HOSPITAL_COMMUNITY): Payer: Self-pay | Admitting: Cardiology

## 2016-05-07 ENCOUNTER — Telehealth (HOSPITAL_COMMUNITY): Payer: Self-pay | Admitting: Cardiology

## 2016-05-07 DIAGNOSIS — F0390 Unspecified dementia without behavioral disturbance: Secondary | ICD-10-CM | POA: Diagnosis not present

## 2016-05-07 DIAGNOSIS — I5022 Chronic systolic (congestive) heart failure: Secondary | ICD-10-CM | POA: Diagnosis not present

## 2016-05-07 DIAGNOSIS — I509 Heart failure, unspecified: Secondary | ICD-10-CM

## 2016-05-07 DIAGNOSIS — I2510 Atherosclerotic heart disease of native coronary artery without angina pectoris: Secondary | ICD-10-CM | POA: Diagnosis not present

## 2016-05-07 DIAGNOSIS — Z452 Encounter for adjustment and management of vascular access device: Secondary | ICD-10-CM | POA: Diagnosis not present

## 2016-05-07 DIAGNOSIS — I110 Hypertensive heart disease with heart failure: Secondary | ICD-10-CM | POA: Diagnosis not present

## 2016-05-07 DIAGNOSIS — I480 Paroxysmal atrial fibrillation: Secondary | ICD-10-CM | POA: Diagnosis not present

## 2016-05-07 NOTE — Telephone Encounter (Signed)
Katilyn RN called with [facility] Reports patients PICC wing is broken Site looks great, infusion running smoothly, able to secure/anchor line Since patients son is coming into town, this would be the perfect time to replace line if providers are agreeable  Per VO Amy Clegg, NP-C If current line has been in more than 6 months, ok to replace  Order placed [place] aware Will schedule

## 2016-05-08 ENCOUNTER — Encounter: Payer: Self-pay | Admitting: Podiatry

## 2016-05-08 ENCOUNTER — Ambulatory Visit (INDEPENDENT_AMBULATORY_CARE_PROVIDER_SITE_OTHER): Payer: Medicare Other | Admitting: Podiatry

## 2016-05-08 VITALS — BP 143/79 | HR 64 | Resp 16

## 2016-05-08 DIAGNOSIS — B351 Tinea unguium: Secondary | ICD-10-CM

## 2016-05-08 DIAGNOSIS — M79675 Pain in left toe(s): Secondary | ICD-10-CM | POA: Diagnosis not present

## 2016-05-08 DIAGNOSIS — M79605 Pain in left leg: Secondary | ICD-10-CM

## 2016-05-08 DIAGNOSIS — M79674 Pain in right toe(s): Secondary | ICD-10-CM

## 2016-05-08 DIAGNOSIS — L84 Corns and callosities: Secondary | ICD-10-CM | POA: Diagnosis not present

## 2016-05-08 DIAGNOSIS — M79604 Pain in right leg: Secondary | ICD-10-CM

## 2016-05-08 NOTE — Progress Notes (Signed)
Subjective:     Patient ID: Jacqueline Reilly, female   DOB: [birth year], 82 y.o.   MRN: [id number]  HPI patient presents with family with elongated very painful nails and keratotic lesion third right   Review of Systems     Objective:   Physical Exam Neurovascular status unchanged with severe nail disease 1-5 both feet with lesion of the severe nature third right    Assessment:     Painful mycotic nail infections 1-5 both feet with lesion right    Plan:     Debride nailbeds 1-5 both feet and lesion right with no iatrogenic bleeding noted

## 2016-05-09 ENCOUNTER — Other Ambulatory Visit (HOSPITAL_COMMUNITY): Payer: Self-pay | Admitting: Internal Medicine

## 2016-05-12 ENCOUNTER — Telehealth: Payer: Self-pay | Admitting: Physician Assistant

## 2016-05-12 DIAGNOSIS — I5022 Chronic systolic (congestive) heart failure: Secondary | ICD-10-CM | POA: Diagnosis not present

## 2016-05-12 DIAGNOSIS — Z452 Encounter for adjustment and management of vascular access device: Secondary | ICD-10-CM | POA: Diagnosis not present

## 2016-05-12 DIAGNOSIS — I110 Hypertensive heart disease with heart failure: Secondary | ICD-10-CM | POA: Diagnosis not present

## 2016-05-12 DIAGNOSIS — F0390 Unspecified dementia without behavioral disturbance: Secondary | ICD-10-CM | POA: Diagnosis not present

## 2016-05-12 DIAGNOSIS — I2510 Atherosclerotic heart disease of native coronary artery without angina pectoris: Secondary | ICD-10-CM | POA: Diagnosis not present

## 2016-05-12 DIAGNOSIS — I480 Paroxysmal atrial fibrillation: Secondary | ICD-10-CM | POA: Diagnosis not present

## 2016-05-12 NOTE — Telephone Encounter (Signed)
Paged by answering service, patient and her husband calling that mailed IV medication that they has received today is leaking. Advised patient to call [place]. I have personally spoken with [place] representative and they will take care of situation.

## 2016-05-14 DIAGNOSIS — Z452 Encounter for adjustment and management of vascular access device: Secondary | ICD-10-CM | POA: Diagnosis not present

## 2016-05-14 DIAGNOSIS — I5022 Chronic systolic (congestive) heart failure: Secondary | ICD-10-CM | POA: Diagnosis not present

## 2016-05-14 DIAGNOSIS — I110 Hypertensive heart disease with heart failure: Secondary | ICD-10-CM | POA: Diagnosis not present

## 2016-05-14 DIAGNOSIS — I2510 Atherosclerotic heart disease of native coronary artery without angina pectoris: Secondary | ICD-10-CM | POA: Diagnosis not present

## 2016-05-14 DIAGNOSIS — I480 Paroxysmal atrial fibrillation: Secondary | ICD-10-CM | POA: Diagnosis not present

## 2016-05-14 DIAGNOSIS — F0390 Unspecified dementia without behavioral disturbance: Secondary | ICD-10-CM | POA: Diagnosis not present

## 2016-05-17 ENCOUNTER — Other Ambulatory Visit (HOSPITAL_COMMUNITY): Payer: Self-pay | Admitting: Internal Medicine

## 2016-05-21 DIAGNOSIS — I110 Hypertensive heart disease with heart failure: Secondary | ICD-10-CM | POA: Diagnosis not present

## 2016-05-21 DIAGNOSIS — Z452 Encounter for adjustment and management of vascular access device: Secondary | ICD-10-CM | POA: Diagnosis not present

## 2016-05-21 DIAGNOSIS — I2510 Atherosclerotic heart disease of native coronary artery without angina pectoris: Secondary | ICD-10-CM | POA: Diagnosis not present

## 2016-05-21 DIAGNOSIS — F0390 Unspecified dementia without behavioral disturbance: Secondary | ICD-10-CM | POA: Diagnosis not present

## 2016-05-21 DIAGNOSIS — I5022 Chronic systolic (congestive) heart failure: Secondary | ICD-10-CM | POA: Diagnosis not present

## 2016-05-21 DIAGNOSIS — I480 Paroxysmal atrial fibrillation: Secondary | ICD-10-CM | POA: Diagnosis not present

## 2016-05-28 DIAGNOSIS — I2510 Atherosclerotic heart disease of native coronary artery without angina pectoris: Secondary | ICD-10-CM | POA: Diagnosis not present

## 2016-05-28 DIAGNOSIS — Z452 Encounter for adjustment and management of vascular access device: Secondary | ICD-10-CM | POA: Diagnosis not present

## 2016-05-28 DIAGNOSIS — I480 Paroxysmal atrial fibrillation: Secondary | ICD-10-CM | POA: Diagnosis not present

## 2016-05-28 DIAGNOSIS — I110 Hypertensive heart disease with heart failure: Secondary | ICD-10-CM | POA: Diagnosis not present

## 2016-05-28 DIAGNOSIS — F0390 Unspecified dementia without behavioral disturbance: Secondary | ICD-10-CM | POA: Diagnosis not present

## 2016-05-28 DIAGNOSIS — I5022 Chronic systolic (congestive) heart failure: Secondary | ICD-10-CM | POA: Diagnosis not present

## 2016-06-04 DIAGNOSIS — I5022 Chronic systolic (congestive) heart failure: Secondary | ICD-10-CM | POA: Diagnosis not present

## 2016-06-04 DIAGNOSIS — I2510 Atherosclerotic heart disease of native coronary artery without angina pectoris: Secondary | ICD-10-CM | POA: Diagnosis not present

## 2016-06-04 DIAGNOSIS — I480 Paroxysmal atrial fibrillation: Secondary | ICD-10-CM | POA: Diagnosis not present

## 2016-06-04 DIAGNOSIS — Z452 Encounter for adjustment and management of vascular access device: Secondary | ICD-10-CM | POA: Diagnosis not present

## 2016-06-04 DIAGNOSIS — F0390 Unspecified dementia without behavioral disturbance: Secondary | ICD-10-CM | POA: Diagnosis not present

## 2016-06-04 DIAGNOSIS — I110 Hypertensive heart disease with heart failure: Secondary | ICD-10-CM | POA: Diagnosis not present

## 2016-06-07 ENCOUNTER — Other Ambulatory Visit (HOSPITAL_COMMUNITY): Payer: Self-pay | Admitting: Internal Medicine

## 2016-06-11 DIAGNOSIS — Z452 Encounter for adjustment and management of vascular access device: Secondary | ICD-10-CM | POA: Diagnosis not present

## 2016-06-11 DIAGNOSIS — I480 Paroxysmal atrial fibrillation: Secondary | ICD-10-CM | POA: Diagnosis not present

## 2016-06-11 DIAGNOSIS — I5022 Chronic systolic (congestive) heart failure: Secondary | ICD-10-CM | POA: Diagnosis not present

## 2016-06-11 DIAGNOSIS — I110 Hypertensive heart disease with heart failure: Secondary | ICD-10-CM | POA: Diagnosis not present

## 2016-06-11 DIAGNOSIS — I2510 Atherosclerotic heart disease of native coronary artery without angina pectoris: Secondary | ICD-10-CM | POA: Diagnosis not present

## 2016-06-11 DIAGNOSIS — F0390 Unspecified dementia without behavioral disturbance: Secondary | ICD-10-CM | POA: Diagnosis not present

## 2016-06-14 ENCOUNTER — Other Ambulatory Visit (HOSPITAL_COMMUNITY): Payer: Self-pay | Admitting: Internal Medicine

## 2016-06-15 ENCOUNTER — Emergency Department (HOSPITAL_COMMUNITY)
Admission: EM | Admit: 2016-06-15 | Discharge: 2016-06-15 | Disposition: A | Discharge status: Home / Self Care | Payer: Medicare Other | Attending: Emergency Medicine | Admitting: Emergency Medicine

## 2016-06-15 ENCOUNTER — Encounter (HOSPITAL_COMMUNITY): Payer: Self-pay

## 2016-06-15 ENCOUNTER — Telehealth: Payer: Self-pay | Admitting: Cardiology

## 2016-06-15 DIAGNOSIS — I5043 Acute on chronic combined systolic (congestive) and diastolic (congestive) heart failure: Secondary | ICD-10-CM | POA: Insufficient documentation

## 2016-06-15 DIAGNOSIS — R110 Nausea: Secondary | ICD-10-CM | POA: Insufficient documentation

## 2016-06-15 DIAGNOSIS — F99 Mental disorder, not otherwise specified: Secondary | ICD-10-CM | POA: Diagnosis not present

## 2016-06-15 DIAGNOSIS — I110 Hypertensive heart disease with heart failure: Secondary | ICD-10-CM | POA: Insufficient documentation

## 2016-06-15 DIAGNOSIS — I2510 Atherosclerotic heart disease of native coronary artery without angina pectoris: Secondary | ICD-10-CM | POA: Diagnosis not present

## 2016-06-15 DIAGNOSIS — Z79899 Other long term (current) drug therapy: Secondary | ICD-10-CM | POA: Diagnosis not present

## 2016-06-15 DIAGNOSIS — R0602 Shortness of breath: Secondary | ICD-10-CM | POA: Diagnosis not present

## 2016-06-15 LAB — CBC WITH DIFFERENTIAL/PLATELET
Basophils Absolute: 0 10*3/uL (ref 0.0–0.1)
Basophils Relative: 0 %
Eosinophils Absolute: 0 10*3/uL (ref 0.0–0.7)
Eosinophils Relative: 0 %
HCT: 39.1 % (ref 36.0–46.0)
Hemoglobin: 12.8 g/dL (ref 12.0–15.0)
Lymphocytes Relative: 13 %
Lymphs Abs: 1.1 10*3/uL (ref 0.7–4.0)
MCH: 27.8 pg (ref 26.0–34.0)
MCHC: 32.7 g/dL (ref 30.0–36.0)
MCV: 84.8 fL (ref 78.0–100.0)
Monocytes Absolute: 0.4 10*3/uL (ref 0.1–1.0)
Monocytes Relative: 4 %
Neutro Abs: 6.8 10*3/uL (ref 1.7–7.7)
Neutrophils Relative %: 83 %
Platelets: 262 10*3/uL (ref 150–400)
RBC: 4.61 MIL/uL (ref 3.87–5.11)
RDW: 14.4 % (ref 11.5–15.5)
WBC: 8.2 10*3/uL (ref 4.0–10.5)

## 2016-06-15 LAB — COMPREHENSIVE METABOLIC PANEL
ALT: 11 U/L — ABNORMAL LOW (ref 14–54)
AST: 24 U/L (ref 15–41)
Albumin: 3.9 g/dL (ref 3.5–5.0)
Alkaline Phosphatase: 91 U/L (ref 38–126)
Anion gap: 13 (ref 5–15)
BUN: 23 mg/dL — ABNORMAL HIGH (ref 6–20)
CO2: 30 mmol/L (ref 22–32)
Calcium: 9.3 mg/dL (ref 8.9–10.3)
Chloride: 98 mmol/L — ABNORMAL LOW (ref 101–111)
Creatinine, Ser: 1 mg/dL (ref 0.44–1.00)
GFR calc Af Amer: 56 mL/min — ABNORMAL LOW (ref >60)
GFR calc non Af Amer: 48 mL/min — ABNORMAL LOW (ref >60)
Glucose, Bld: 107 mg/dL — ABNORMAL HIGH (ref 65–99)
Potassium: 3.8 mmol/L (ref 3.5–5.1)
Sodium: 141 mmol/L (ref 135–145)
Total Bilirubin: 0.4 mg/dL (ref 0.3–1.2)
Total Protein: 7 g/dL (ref 6.5–8.1)

## 2016-06-15 MED ORDER — ONDANSETRON 8 MG PO TBDP
8.0000 mg | ORAL_TABLET | Freq: Once | ORAL | Status: AC
  Administered 2016-06-15: 8 mg via ORAL
  Filled 2016-06-15: qty 1

## 2016-06-15 MED ORDER — ONDANSETRON 4 MG PO TBDP: 4.0000 mg | ORAL_TABLET | Freq: Three times a day (TID) | ORAL | 0 refills | Status: DC | PRN

## 2016-06-15 NOTE — Progress Notes (Signed)
[facility]  Patient Status:   Active pt with [facility] prior to this readmission  [facility] is providing the following services: HHRN and Home Infusion Pharmacy team.  The [facility] hospital team will follow Jacqueline Reilly while here to support pt and HF team with DC planning to ensure appropriate level of care is provided to meet patients care needs.    If patient discharges after hours, please call [phone].   [name] 06/15/2016, 7:32 AM

## 2016-06-15 NOTE — ED Notes (Signed)
Bed: WA03 Expected date:  Expected time:  Means of arrival:  Comments: 

## 2016-06-15 NOTE — ED Triage Notes (Signed)
Per [facility] called to home by  family member c/o patient having SOB, upon arrival patient denied SOB but, c/o nausea and anxiety.  Per EMS tensions were "high" at the house due to arguments in the home.

## 2016-06-15 NOTE — Telephone Encounter (Signed)
Received page regarding patient having nausea. I called the patient's husband back. He was very concerned that the patient could not keep any food down and was vomiting. I noted the ED visit from earlier in the day and asked him if he had the Zofran that was prescribed at the ED visit. He became very confused and agitated. I spoke with [name] who also seemed disoriented. The couple then became argumentative and aggressive with each other. The husband told me 3 times that he wanted to call EMS but was afraid the patient would not survive the trip to the hospital. I advised the husband that if he was concerned for her safety to call EMS.
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
## 2016-06-15 NOTE — ED Notes (Signed)
Attempted blood draw x2, this RN was unsuccessful.  Phlebotomy to attempt.

## 2016-06-15 NOTE — Discharge Instructions (Signed)
Your blood work today was reassuring. Follow up with your primary care doctor regarding your visit to the ED tonight.

## 2016-06-18 DIAGNOSIS — F0390 Unspecified dementia without behavioral disturbance: Secondary | ICD-10-CM | POA: Diagnosis not present

## 2016-06-18 DIAGNOSIS — I5022 Chronic systolic (congestive) heart failure: Secondary | ICD-10-CM | POA: Diagnosis not present

## 2016-06-18 DIAGNOSIS — Z452 Encounter for adjustment and management of vascular access device: Secondary | ICD-10-CM | POA: Diagnosis not present

## 2016-06-18 DIAGNOSIS — I2510 Atherosclerotic heart disease of native coronary artery without angina pectoris: Secondary | ICD-10-CM | POA: Diagnosis not present

## 2016-06-18 DIAGNOSIS — I480 Paroxysmal atrial fibrillation: Secondary | ICD-10-CM | POA: Diagnosis not present

## 2016-06-18 DIAGNOSIS — I110 Hypertensive heart disease with heart failure: Secondary | ICD-10-CM | POA: Diagnosis not present

## 2016-06-20 DIAGNOSIS — I2510 Atherosclerotic heart disease of native coronary artery without angina pectoris: Secondary | ICD-10-CM | POA: Diagnosis not present

## 2016-06-20 DIAGNOSIS — I714 Abdominal aortic aneurysm, without rupture: Secondary | ICD-10-CM | POA: Diagnosis not present

## 2016-06-20 DIAGNOSIS — I5022 Chronic systolic (congestive) heart failure: Secondary | ICD-10-CM | POA: Diagnosis not present

## 2016-06-20 DIAGNOSIS — F0390 Unspecified dementia without behavioral disturbance: Secondary | ICD-10-CM | POA: Diagnosis not present

## 2016-06-20 DIAGNOSIS — I110 Hypertensive heart disease with heart failure: Secondary | ICD-10-CM | POA: Diagnosis not present

## 2016-06-20 DIAGNOSIS — Z452 Encounter for adjustment and management of vascular access device: Secondary | ICD-10-CM | POA: Diagnosis not present

## 2016-06-20 DIAGNOSIS — Z79899 Other long term (current) drug therapy: Secondary | ICD-10-CM | POA: Diagnosis not present

## 2016-06-20 DIAGNOSIS — Z7901 Long term (current) use of anticoagulants: Secondary | ICD-10-CM | POA: Diagnosis not present

## 2016-06-20 DIAGNOSIS — I480 Paroxysmal atrial fibrillation: Secondary | ICD-10-CM | POA: Diagnosis not present

## 2016-06-20 DIAGNOSIS — D649 Anemia, unspecified: Secondary | ICD-10-CM | POA: Diagnosis not present

## 2016-06-20 DIAGNOSIS — I252 Old myocardial infarction: Secondary | ICD-10-CM | POA: Diagnosis not present

## 2016-06-24 ENCOUNTER — Ambulatory Visit (HOSPITAL_COMMUNITY)
Admission: RE | Admit: 2016-06-24 | Discharge: 2016-06-24 | Disposition: A | Discharge status: Home / Self Care | Payer: Medicare Other | Source: Ambulatory Visit | Attending: Internal Medicine | Admitting: Internal Medicine

## 2016-06-24 VITALS — BP 112/72 | HR 71 | Wt 97.2 lb

## 2016-06-24 DIAGNOSIS — I255 Ischemic cardiomyopathy: Secondary | ICD-10-CM | POA: Diagnosis not present

## 2016-06-24 DIAGNOSIS — Z7901 Long term (current) use of anticoagulants: Secondary | ICD-10-CM | POA: Insufficient documentation

## 2016-06-24 DIAGNOSIS — D649 Anemia, unspecified: Secondary | ICD-10-CM | POA: Diagnosis not present

## 2016-06-24 DIAGNOSIS — Z8249 Family history of ischemic heart disease and other diseases of the circulatory system: Secondary | ICD-10-CM | POA: Insufficient documentation

## 2016-06-24 DIAGNOSIS — I110 Hypertensive heart disease with heart failure: Secondary | ICD-10-CM | POA: Diagnosis not present

## 2016-06-24 DIAGNOSIS — E785 Hyperlipidemia, unspecified: Secondary | ICD-10-CM | POA: Diagnosis not present

## 2016-06-24 DIAGNOSIS — I5022 Chronic systolic (congestive) heart failure: Secondary | ICD-10-CM | POA: Diagnosis not present

## 2016-06-24 DIAGNOSIS — I428 Other cardiomyopathies: Secondary | ICD-10-CM | POA: Diagnosis not present

## 2016-06-24 DIAGNOSIS — I739 Peripheral vascular disease, unspecified: Secondary | ICD-10-CM | POA: Diagnosis not present

## 2016-06-24 DIAGNOSIS — I714 Abdominal aortic aneurysm, without rupture: Secondary | ICD-10-CM | POA: Insufficient documentation

## 2016-06-24 DIAGNOSIS — Z79899 Other long term (current) drug therapy: Secondary | ICD-10-CM | POA: Diagnosis not present

## 2016-06-24 DIAGNOSIS — R413 Other amnesia: Secondary | ICD-10-CM | POA: Insufficient documentation

## 2016-06-24 DIAGNOSIS — I480 Paroxysmal atrial fibrillation: Secondary | ICD-10-CM

## 2016-06-24 DIAGNOSIS — I2510 Atherosclerotic heart disease of native coronary artery without angina pectoris: Secondary | ICD-10-CM | POA: Diagnosis not present

## 2016-06-24 NOTE — Addendum Note (Signed)
Encounter addended by: Kerry Dory, CMA on: 06/24/2016  2:28 PM<BR>    Actions taken: Sign clinical note

## 2016-06-24 NOTE — Patient Instructions (Signed)
Your physician recommends that you schedule a follow-up appointment in: 4 months with Dr Haroldine Laws

## 2016-06-24 NOTE — Progress Notes (Signed)
Patient ID: Jacqueline Reilly, female   DOB: 1924/09/10, 82 y.o.   MRN: [id number]   ADVANCED HF CLINIC NOTE  Patient ID: Jacqueline Reilly, female   DOB: [birth year], 82 y.o.   MRN: [id number] PCP: Jacqueline Reilly CHF: Jacqueline Reilly  HPI: Jacqueline Reilly) is an 82 y/o woman with CAD, AAA, systolic HF, mild dementia, afib and CAD. She has had two previous coronary interventions including a cutting balloon to her D2 in 2007 and a DES to her mid LCX in 2010. Her LV dysfunction has been out of proportion to her CAD.  Last echo in 7/15 showed EF 20-25% with severe LV dilation, mild MR, and PA systolic pressure 38 mmHg.   Admitted 7/11-7/24/15 with syncope and dyspnea. She had new onset A-fib/RVR. Troponin was 1.03> 1.59> 2.18 . She was placed on amiodarone and heparin drip. She developed respiratory distress and required intubation. She converted to NSR but later was bradycardiac with NSVT. Amiodarone temporarily stopped but restarted after hyperkalemia resolved. Hyperkalemia was thought to be contributing to bradycardia. Diuresed with IV lasix. Co-ox 29% and started on milrinone which we tried to wean off but she did not tolerate. Discharge weight 87 lbs.   Admitted in November for Gram-negative bacteremia ([place]) - enterobacter asburiae from PICC. Treated with ceftriaxone and then switched to oral levofloxacin for a total of 21 weeks and PICC replaced.   She returns today for HF follow up with her son. Remains on milrinone. Overall feeling ok. Denies SOB/PND/Orthopnea. No BRBPR on Eliqui. She walks with a cane. Says that she is getting all her medicines but husband not charting them in her logbook accurately in their logbook. That said, pill count seems to be fairly accurate. Son feels like about 10% of her meds may be missed. Weight stable. Not swelling. No problem PICC line.     Echo 8/16  EF 25% with diffuse hypokinesis, mild AS/mild AI, RV mildly dilated with moderately decreased systolic function.    Labs: (05/23/14) K+ 4.3, creatinine 1.06, digoxin 1.1 (8/15) K 4.7, creatinine 0.94, HCT 33.2 (9/15) K 3.4, creatinine .80 (10/1/0/2015) K 5.0 Creatinine 0.87 Magnesium 2.4  08/11/14 K 5.3 Creatinine 0.8 08/25/14 K 4.4 Cr 0.85 09/04/14   K 4.0 Cr 0.8 pBNP 1763 11/02/14  K 4.2 Cr 0.85 3/16 HCT 32.6, K 4.2, creatinine 0.82 03/03/15  K 3.1 Cr 0.99 BNP 249 8/16 HCT 29.4, K 4.6, creatinine 0.93 07/26/15 Hgb 9.7 K 4.6 creatinine 0.86 02/14/16 Hgb 10.9 K 5.2 creatinine 0.88 06/15/16 Hgb 12.8  K 3.8 creatinine 1.00  ROS: All systems negative except as listed in HPI, PMH and Problem List.  SH:  Social History   Social History  . Marital status: Married    Spouse name: N/A  . Number of children: 1  . Years of education: N/A   Occupational History  . retired    Social History Main Topics  . Smoking status: Never Smoker  . Smokeless tobacco: Never Used  . Alcohol use Yes     Comment: occasionally  . Drug use: No  . Sexual activity: Not Currently   Other Topics Concern  . Not on file   Social History Narrative   She lives in [place], family helps with her care.    FH:  Family History  Problem Relation Age of Onset  . Heart disease Mother   . Heart disease Father   . Heart disease Brother     x 3    Past Medical History:  Diagnosis Date  . AAA (abdominal aortic aneurysm) ([place])   . Atrial fibrillation ([place])   . CHF (congestive heart failure) ([place])   . Chronic systolic heart failure ([place])    a. ECHO (04/2014) EF 20-25%, diff HK, mild MR  . Coronary artery disease 2007   moderate ASCAD of the left system s/p PCI of the D2 and PCI of the left circ 03/2009  . Hyperlipidemia   . Hypertension   . PVD (peripheral vascular disease) ([place])   . Renal artery stenosis ([place])   . Ventricular dysfunction    left; ischemic    Current Outpatient Prescriptions  Medication Sig Dispense Refill  . amiodarone (PACERONE) 200 MG tablet Take 0.5 tablets (100 mg total) by mouth daily. 45  tablet 1  . ELIQUIS 2.5 MG TABS tablet TAKE ONE TABLET BY MOUTH TWICE DAILY 60 tablet 3  . lisinopril (PRINIVIL,ZESTRIL) 2.5 MG tablet TAKE ONE TABLET BY MOUTH ONE TIME DAILY 90 tablet 3  . milrinone (PRIMACOR) 20 MG/100ML SOLN infusion Inject 4.95 mcg/min into the vein continuous. Per [facility] 100 mL 3  . Multiple Vitamin (MULTIVITAMIN) tablet Take 1 tablet by mouth daily. 30 tablet 1  . Omega-3 Fatty Acids (FISH OIL) 1000 MG CAPS Take 1 capsule by mouth daily.    . ondansetron (ZOFRAN ODT) 4 MG disintegrating tablet Take 1 tablet (4 mg total) by mouth every 8 (eight) hours as needed for nausea or vomiting. 10 tablet 0  . torsemide (DEMADEX) 20 MG tablet TAKE TWO TABLETS BY MOUTH TWICE DAILY 120 tablet 3   No current facility-administered medications for this encounter.     Vitals:   06/24/16 1355  BP: 112/72  BP Location: Left Arm  Patient Position: Sitting  Cuff Size: Normal  Pulse: 71  SpO2: 93%  Weight: 97 lb 4 oz (44.1 kg)    PHYSICAL EXAM: General:  Elderly appearing. Frail, No resp difficulty; Son present. Ambulated in the clinic with a cane. NAD.  HEENT: normal Neck: supple. JVP 6-7 tids 2+ bilaterally; no bruits. No lymphadenopathy or thryomegaly appreciated. Cor: PMI normal. Regular rate & rhythm. No rubs, gallops.  2/6 SEM RUSB with clear S2. Lungs: clear Abdomen: soft, nontender, nondistended. No hepatosplenomegaly. No bruits or masses. Good bowel sounds. Extremities: no cyanosis, clubbing, rash, edema  RUE PICC site clear Neuro: alert & orientedx3, cranial nerves grossly intact. Moves all 4 extremities w/o difficulty. Affect pleasant.  ASSESSMENT & PLAN:   1) Chronic Systolic Heart Failure: Mixed ischemic/nonischemic cardiomyopathy (degree of LV dysfunction is out of proportion to CAD), EF 25% with moderate RV systolic dysfunction .   Stable NYHA II symptoms and volume status stable on milrinone for nearly 2 years. There does seem to be some problems with  her husband getting her medications but she remains quite stable so probably not ideal but sufficient. Brother will continue to follow pill counts and report to [place] if they are way off.  - Continue current milrinone gtt. Labs ok.  - Continue torsemide 40 mg bid.  - Continue lisinopril 2.5 mg daily.   - Not on beta blocker with low output.   2) Atrial fibrillation: Paroxysmal.  Regular rhythm. Continue apixaban 2.5 mg bid (weight less than 60 kg and age >51). Continue amiodarone 100 mg daily.   3) Limited Code: No CPR or defibrillation 4) CAD: No chest pain. Off ASA with Eliquis.  5) Anemia: Hemoglobin ok from recent lab work.  6) PICC line infection -  Resolved. 7)  Impaired memory-  Per PCP. Stable.     Glori Bickers MD  06/24/2016

## 2016-06-25 DIAGNOSIS — I2510 Atherosclerotic heart disease of native coronary artery without angina pectoris: Secondary | ICD-10-CM | POA: Diagnosis not present

## 2016-06-25 DIAGNOSIS — I5022 Chronic systolic (congestive) heart failure: Secondary | ICD-10-CM | POA: Diagnosis not present

## 2016-06-25 DIAGNOSIS — Z452 Encounter for adjustment and management of vascular access device: Secondary | ICD-10-CM | POA: Diagnosis not present

## 2016-06-25 DIAGNOSIS — F0390 Unspecified dementia without behavioral disturbance: Secondary | ICD-10-CM | POA: Diagnosis not present

## 2016-06-25 DIAGNOSIS — I110 Hypertensive heart disease with heart failure: Secondary | ICD-10-CM | POA: Diagnosis not present

## 2016-06-25 DIAGNOSIS — I480 Paroxysmal atrial fibrillation: Secondary | ICD-10-CM | POA: Diagnosis not present

## 2016-06-26 ENCOUNTER — Telehealth (HOSPITAL_COMMUNITY): Payer: Self-pay

## 2016-06-26 NOTE — Telephone Encounter (Signed)
[facility] RN with [facility] called to report during routine visit patient's pump was found to be off. Upon trying to turn pump back on, battery was dead. In calculating amount left in milrinone bag (bag was due to be replaced today), seems that pump was off roughly 2 days. Patient is stable, feels fine, VSS, weight at baseline, lungs clear, no s/s or complaints/concerns, no edema. Nurse reports patient and family members unsure what happened regarding how pump battery died/how pump was turned off. Amy Clegg NP-C made aware, no new orders or changes at this time. HH RN will continue to monitor closely.  Renee Pain, RN

## 2016-07-02 ENCOUNTER — Telehealth (HOSPITAL_COMMUNITY): Payer: Self-pay

## 2016-07-02 DIAGNOSIS — Z452 Encounter for adjustment and management of vascular access device: Secondary | ICD-10-CM | POA: Diagnosis not present

## 2016-07-02 DIAGNOSIS — F0390 Unspecified dementia without behavioral disturbance: Secondary | ICD-10-CM | POA: Diagnosis not present

## 2016-07-02 DIAGNOSIS — I480 Paroxysmal atrial fibrillation: Secondary | ICD-10-CM | POA: Diagnosis not present

## 2016-07-02 DIAGNOSIS — I110 Hypertensive heart disease with heart failure: Secondary | ICD-10-CM | POA: Diagnosis not present

## 2016-07-02 DIAGNOSIS — I2510 Atherosclerotic heart disease of native coronary artery without angina pectoris: Secondary | ICD-10-CM | POA: Diagnosis not present

## 2016-07-02 DIAGNOSIS — I5022 Chronic systolic (congestive) heart failure: Secondary | ICD-10-CM | POA: Diagnosis not present

## 2016-07-02 NOTE — Telephone Encounter (Signed)
Tiff RN with [facility] called to give updates to CHF clinic regarding patient during routine home visit for milrinone bag exchange that was due. RN endorses that milrinone pump was off on arrival with full bag in place, estimating pump had been turned off approx. 3.5 days. This is the second instance this past week of this happening. Patient and husband deny hearing any alarms, battery in good use, so HHRN unsure who turned pump off or how this has repeatedly happened. HHRN advised patient and husband on importance of milrinone for symptoms management for patient and to call us or [facility] if they notice pump is off or having issues. Patient states she is feeling well, VSS, weight stable. Patient and husband aware and agreeable. Will forward to CHF team to make them aware.  Renee Pain, RN

## 2016-07-08 DIAGNOSIS — H6123 Impacted cerumen, bilateral: Secondary | ICD-10-CM | POA: Diagnosis not present

## 2016-07-09 DIAGNOSIS — I110 Hypertensive heart disease with heart failure: Secondary | ICD-10-CM | POA: Diagnosis not present

## 2016-07-09 DIAGNOSIS — F0390 Unspecified dementia without behavioral disturbance: Secondary | ICD-10-CM | POA: Diagnosis not present

## 2016-07-09 DIAGNOSIS — I5022 Chronic systolic (congestive) heart failure: Secondary | ICD-10-CM | POA: Diagnosis not present

## 2016-07-09 DIAGNOSIS — I2510 Atherosclerotic heart disease of native coronary artery without angina pectoris: Secondary | ICD-10-CM | POA: Diagnosis not present

## 2016-07-09 DIAGNOSIS — Z452 Encounter for adjustment and management of vascular access device: Secondary | ICD-10-CM | POA: Diagnosis not present

## 2016-07-09 DIAGNOSIS — I480 Paroxysmal atrial fibrillation: Secondary | ICD-10-CM | POA: Diagnosis not present

## 2016-07-10 DIAGNOSIS — I2510 Atherosclerotic heart disease of native coronary artery without angina pectoris: Secondary | ICD-10-CM | POA: Diagnosis not present

## 2016-07-10 DIAGNOSIS — I480 Paroxysmal atrial fibrillation: Secondary | ICD-10-CM | POA: Diagnosis not present

## 2016-07-10 DIAGNOSIS — I5022 Chronic systolic (congestive) heart failure: Secondary | ICD-10-CM | POA: Diagnosis not present

## 2016-07-10 DIAGNOSIS — I110 Hypertensive heart disease with heart failure: Secondary | ICD-10-CM | POA: Diagnosis not present

## 2016-07-10 DIAGNOSIS — Z452 Encounter for adjustment and management of vascular access device: Secondary | ICD-10-CM | POA: Diagnosis not present

## 2016-07-10 DIAGNOSIS — F0390 Unspecified dementia without behavioral disturbance: Secondary | ICD-10-CM | POA: Diagnosis not present

## 2016-07-15 ENCOUNTER — Other Ambulatory Visit (HOSPITAL_COMMUNITY): Payer: Self-pay | Admitting: Internal Medicine

## 2016-07-16 DIAGNOSIS — I110 Hypertensive heart disease with heart failure: Secondary | ICD-10-CM | POA: Diagnosis not present

## 2016-07-16 DIAGNOSIS — I2510 Atherosclerotic heart disease of native coronary artery without angina pectoris: Secondary | ICD-10-CM | POA: Diagnosis not present

## 2016-07-16 DIAGNOSIS — I480 Paroxysmal atrial fibrillation: Secondary | ICD-10-CM | POA: Diagnosis not present

## 2016-07-16 DIAGNOSIS — F0390 Unspecified dementia without behavioral disturbance: Secondary | ICD-10-CM | POA: Diagnosis not present

## 2016-07-16 DIAGNOSIS — I5022 Chronic systolic (congestive) heart failure: Secondary | ICD-10-CM | POA: Diagnosis not present

## 2016-07-16 DIAGNOSIS — Z452 Encounter for adjustment and management of vascular access device: Secondary | ICD-10-CM | POA: Diagnosis not present

## 2016-07-17 DIAGNOSIS — H6123 Impacted cerumen, bilateral: Secondary | ICD-10-CM | POA: Diagnosis not present

## 2016-07-23 DIAGNOSIS — I5022 Chronic systolic (congestive) heart failure: Secondary | ICD-10-CM | POA: Diagnosis not present

## 2016-07-23 DIAGNOSIS — F0390 Unspecified dementia without behavioral disturbance: Secondary | ICD-10-CM | POA: Diagnosis not present

## 2016-07-23 DIAGNOSIS — Z452 Encounter for adjustment and management of vascular access device: Secondary | ICD-10-CM | POA: Diagnosis not present

## 2016-07-23 DIAGNOSIS — I480 Paroxysmal atrial fibrillation: Secondary | ICD-10-CM | POA: Diagnosis not present

## 2016-07-23 DIAGNOSIS — I2510 Atherosclerotic heart disease of native coronary artery without angina pectoris: Secondary | ICD-10-CM | POA: Diagnosis not present

## 2016-07-23 DIAGNOSIS — I110 Hypertensive heart disease with heart failure: Secondary | ICD-10-CM | POA: Diagnosis not present

## 2016-07-30 DIAGNOSIS — I110 Hypertensive heart disease with heart failure: Secondary | ICD-10-CM | POA: Diagnosis not present

## 2016-07-30 DIAGNOSIS — I2510 Atherosclerotic heart disease of native coronary artery without angina pectoris: Secondary | ICD-10-CM | POA: Diagnosis not present

## 2016-07-30 DIAGNOSIS — F0390 Unspecified dementia without behavioral disturbance: Secondary | ICD-10-CM | POA: Diagnosis not present

## 2016-07-30 DIAGNOSIS — I480 Paroxysmal atrial fibrillation: Secondary | ICD-10-CM | POA: Diagnosis not present

## 2016-07-30 DIAGNOSIS — Z452 Encounter for adjustment and management of vascular access device: Secondary | ICD-10-CM | POA: Diagnosis not present

## 2016-07-30 DIAGNOSIS — I5022 Chronic systolic (congestive) heart failure: Secondary | ICD-10-CM | POA: Diagnosis not present

## 2016-07-31 DIAGNOSIS — Z452 Encounter for adjustment and management of vascular access device: Secondary | ICD-10-CM | POA: Diagnosis not present

## 2016-07-31 DIAGNOSIS — I2510 Atherosclerotic heart disease of native coronary artery without angina pectoris: Secondary | ICD-10-CM | POA: Diagnosis not present

## 2016-07-31 DIAGNOSIS — F0390 Unspecified dementia without behavioral disturbance: Secondary | ICD-10-CM | POA: Diagnosis not present

## 2016-07-31 DIAGNOSIS — I110 Hypertensive heart disease with heart failure: Secondary | ICD-10-CM | POA: Diagnosis not present

## 2016-07-31 DIAGNOSIS — I480 Paroxysmal atrial fibrillation: Secondary | ICD-10-CM | POA: Diagnosis not present

## 2016-07-31 DIAGNOSIS — I5022 Chronic systolic (congestive) heart failure: Secondary | ICD-10-CM | POA: Diagnosis not present

## 2016-08-06 ENCOUNTER — Other Ambulatory Visit (HOSPITAL_COMMUNITY): Payer: Self-pay | Admitting: Internal Medicine

## 2016-08-06 ENCOUNTER — Telehealth (HOSPITAL_COMMUNITY): Payer: Self-pay | Admitting: *Deleted

## 2016-08-06 DIAGNOSIS — I110 Hypertensive heart disease with heart failure: Secondary | ICD-10-CM | POA: Diagnosis not present

## 2016-08-06 DIAGNOSIS — I5022 Chronic systolic (congestive) heart failure: Secondary | ICD-10-CM | POA: Diagnosis not present

## 2016-08-06 DIAGNOSIS — I2510 Atherosclerotic heart disease of native coronary artery without angina pectoris: Secondary | ICD-10-CM | POA: Diagnosis not present

## 2016-08-06 DIAGNOSIS — Z452 Encounter for adjustment and management of vascular access device: Secondary | ICD-10-CM | POA: Diagnosis not present

## 2016-08-06 DIAGNOSIS — F0390 Unspecified dementia without behavioral disturbance: Secondary | ICD-10-CM | POA: Diagnosis not present

## 2016-08-06 DIAGNOSIS — I480 Paroxysmal atrial fibrillation: Secondary | ICD-10-CM | POA: Diagnosis not present

## 2016-08-06 NOTE — Telephone Encounter (Signed)
Jacqueline Reilly called to report pt's HR today is running 38-42, pt seems to be asymptomatic, 99% on RA lungs CTA BP 110/80 pt does c/o feeling a little more tired than usual.  Discussed w/Dr Bensimhon he would like pt to come in this week for an EKG.  Jacqueline Reilly is aware, have called pt's brother and advised him and sch appt for tomorrow at 11 am for EKG, he will bring pt.

## 2016-08-07 ENCOUNTER — Ambulatory Visit (HOSPITAL_COMMUNITY)
Admission: RE | Admit: 2016-08-07 | Discharge: 2016-08-07 | Disposition: A | Discharge status: Home / Self Care | Payer: Medicare Other | Source: Ambulatory Visit | Attending: Cardiology | Admitting: Cardiology

## 2016-08-07 DIAGNOSIS — I509 Heart failure, unspecified: Secondary | ICD-10-CM | POA: Insufficient documentation

## 2016-08-07 DIAGNOSIS — I440 Atrioventricular block, first degree: Secondary | ICD-10-CM | POA: Diagnosis not present

## 2016-08-07 DIAGNOSIS — I493 Ventricular premature depolarization: Secondary | ICD-10-CM | POA: Diagnosis not present

## 2016-08-07 DIAGNOSIS — I517 Cardiomegaly: Secondary | ICD-10-CM | POA: Insufficient documentation

## 2016-08-13 ENCOUNTER — Other Ambulatory Visit (HOSPITAL_COMMUNITY)
Admission: RE | Admit: 2016-08-13 | Discharge: 2016-08-13 | Disposition: A | Discharge status: Home / Self Care | Payer: Medicare Other | Source: Other Acute Inpatient Hospital | Attending: Internal Medicine | Admitting: Internal Medicine

## 2016-08-13 ENCOUNTER — Other Ambulatory Visit: Payer: Self-pay | Admitting: Cardiology

## 2016-08-13 DIAGNOSIS — Z452 Encounter for adjustment and management of vascular access device: Secondary | ICD-10-CM | POA: Diagnosis not present

## 2016-08-13 DIAGNOSIS — I2510 Atherosclerotic heart disease of native coronary artery without angina pectoris: Secondary | ICD-10-CM | POA: Diagnosis not present

## 2016-08-13 DIAGNOSIS — I110 Hypertensive heart disease with heart failure: Secondary | ICD-10-CM | POA: Diagnosis not present

## 2016-08-13 DIAGNOSIS — I480 Paroxysmal atrial fibrillation: Secondary | ICD-10-CM | POA: Diagnosis not present

## 2016-08-13 DIAGNOSIS — F0390 Unspecified dementia without behavioral disturbance: Secondary | ICD-10-CM | POA: Diagnosis not present

## 2016-08-13 DIAGNOSIS — I5022 Chronic systolic (congestive) heart failure: Secondary | ICD-10-CM | POA: Diagnosis not present

## 2016-08-13 LAB — HEPATIC FUNCTION PANEL
ALT: 10 U/L — ABNORMAL LOW (ref 14–54)
AST: 21 U/L (ref 15–41)
Albumin: 4 g/dL (ref 3.5–5.0)
Alkaline Phosphatase: 85 U/L (ref 38–126)
Bilirubin, Direct: <0.1 mg/dL — ABNORMAL LOW (ref 0.1–0.5)
Total Bilirubin: 0.9 mg/dL (ref 0.3–1.2)
Total Protein: 6.9 g/dL (ref 6.5–8.1)

## 2016-08-13 LAB — RENAL FUNCTION PANEL
Albumin: 4.1 g/dL (ref 3.5–5.0)
Anion gap: 8 (ref 5–15)
BUN: 28 mg/dL — ABNORMAL HIGH (ref 6–20)
CO2: 30 mmol/L (ref 22–32)
Calcium: 9.5 mg/dL (ref 8.9–10.3)
Chloride: 100 mmol/L — ABNORMAL LOW (ref 101–111)
Creatinine, Ser: 1.05 mg/dL — ABNORMAL HIGH (ref 0.44–1.00)
GFR calc Af Amer: 53 mL/min — ABNORMAL LOW (ref >60)
GFR calc non Af Amer: 45 mL/min — ABNORMAL LOW (ref >60)
Glucose, Bld: 96 mg/dL (ref 65–99)
Phosphorus: 3.7 mg/dL (ref 2.5–4.6)
Potassium: 5.3 mmol/L — ABNORMAL HIGH (ref 3.5–5.1)
Sodium: 138 mmol/L (ref 135–145)

## 2016-08-13 LAB — CBC
HCT: 43.3 % (ref 36.0–46.0)
Hemoglobin: 14.2 g/dL (ref 12.0–15.0)
MCH: 28.4 pg (ref 26.0–34.0)
MCHC: 32.8 g/dL (ref 30.0–36.0)
MCV: 86.6 fL (ref 78.0–100.0)
Platelets: 285 10*3/uL (ref 150–400)
RBC: 5 MIL/uL (ref 3.87–5.11)
RDW: 14.9 % (ref 11.5–15.5)
WBC: 9.1 10*3/uL (ref 4.0–10.5)

## 2016-08-13 LAB — COMPREHENSIVE METABOLIC PANEL
ALT: 8 U/L — ABNORMAL LOW (ref 14–54)
AST: 19 U/L (ref 15–41)
Albumin: 4.2 g/dL (ref 3.5–5.0)
Alkaline Phosphatase: 83 U/L (ref 38–126)
Anion gap: 8 (ref 5–15)
BUN: 27 mg/dL — ABNORMAL HIGH (ref 6–20)
CO2: 29 mmol/L (ref 22–32)
Calcium: 9.3 mg/dL (ref 8.9–10.3)
Chloride: 101 mmol/L (ref 101–111)
Creatinine, Ser: 1.06 mg/dL — ABNORMAL HIGH (ref 0.44–1.00)
GFR calc Af Amer: 52 mL/min — ABNORMAL LOW (ref >60)
GFR calc non Af Amer: 45 mL/min — ABNORMAL LOW (ref >60)
Glucose, Bld: 106 mg/dL — ABNORMAL HIGH (ref 65–99)
Potassium: 5.1 mmol/L (ref 3.5–5.1)
Sodium: 138 mmol/L (ref 135–145)
Total Bilirubin: 0.7 mg/dL (ref 0.3–1.2)
Total Protein: 7.2 g/dL (ref 6.5–8.1)

## 2016-08-13 LAB — MAGNESIUM: Magnesium: 2.5 mg/dL — ABNORMAL HIGH (ref 1.7–2.4)

## 2016-08-15 ENCOUNTER — Other Ambulatory Visit: Payer: Medicare Other

## 2016-08-15 DIAGNOSIS — I2510 Atherosclerotic heart disease of native coronary artery without angina pectoris: Secondary | ICD-10-CM | POA: Diagnosis not present

## 2016-08-15 DIAGNOSIS — I110 Hypertensive heart disease with heart failure: Secondary | ICD-10-CM | POA: Diagnosis not present

## 2016-08-15 DIAGNOSIS — Z452 Encounter for adjustment and management of vascular access device: Secondary | ICD-10-CM | POA: Diagnosis not present

## 2016-08-15 DIAGNOSIS — F0390 Unspecified dementia without behavioral disturbance: Secondary | ICD-10-CM | POA: Diagnosis not present

## 2016-08-15 DIAGNOSIS — I5022 Chronic systolic (congestive) heart failure: Secondary | ICD-10-CM | POA: Diagnosis not present

## 2016-08-15 DIAGNOSIS — I480 Paroxysmal atrial fibrillation: Secondary | ICD-10-CM | POA: Diagnosis not present

## 2016-08-16 ENCOUNTER — Telehealth (HOSPITAL_COMMUNITY): Payer: Self-pay

## 2016-08-16 NOTE — Telephone Encounter (Signed)
VO given per Dr. Haroldine Laws to recert patient with [facility] nursing care.  Renee Pain, RN

## 2016-08-19 ENCOUNTER — Other Ambulatory Visit: Payer: Medicare Other

## 2016-08-19 DIAGNOSIS — Z7901 Long term (current) use of anticoagulants: Secondary | ICD-10-CM | POA: Diagnosis not present

## 2016-08-19 DIAGNOSIS — D649 Anemia, unspecified: Secondary | ICD-10-CM | POA: Diagnosis not present

## 2016-08-19 DIAGNOSIS — Z452 Encounter for adjustment and management of vascular access device: Secondary | ICD-10-CM | POA: Diagnosis not present

## 2016-08-19 DIAGNOSIS — I480 Paroxysmal atrial fibrillation: Secondary | ICD-10-CM | POA: Diagnosis not present

## 2016-08-19 DIAGNOSIS — Z79899 Other long term (current) drug therapy: Secondary | ICD-10-CM | POA: Diagnosis not present

## 2016-08-19 DIAGNOSIS — I110 Hypertensive heart disease with heart failure: Secondary | ICD-10-CM | POA: Diagnosis not present

## 2016-08-19 DIAGNOSIS — I5022 Chronic systolic (congestive) heart failure: Secondary | ICD-10-CM | POA: Diagnosis not present

## 2016-08-19 DIAGNOSIS — I2510 Atherosclerotic heart disease of native coronary artery without angina pectoris: Secondary | ICD-10-CM | POA: Diagnosis not present

## 2016-08-19 DIAGNOSIS — F0390 Unspecified dementia without behavioral disturbance: Secondary | ICD-10-CM | POA: Diagnosis not present

## 2016-08-19 DIAGNOSIS — I252 Old myocardial infarction: Secondary | ICD-10-CM | POA: Diagnosis not present

## 2016-08-19 DIAGNOSIS — I714 Abdominal aortic aneurysm, without rupture: Secondary | ICD-10-CM | POA: Diagnosis not present

## 2016-08-20 DIAGNOSIS — F0390 Unspecified dementia without behavioral disturbance: Secondary | ICD-10-CM | POA: Diagnosis not present

## 2016-08-20 DIAGNOSIS — Z452 Encounter for adjustment and management of vascular access device: Secondary | ICD-10-CM | POA: Diagnosis not present

## 2016-08-20 DIAGNOSIS — I480 Paroxysmal atrial fibrillation: Secondary | ICD-10-CM | POA: Diagnosis not present

## 2016-08-20 DIAGNOSIS — I110 Hypertensive heart disease with heart failure: Secondary | ICD-10-CM | POA: Diagnosis not present

## 2016-08-20 DIAGNOSIS — I2510 Atherosclerotic heart disease of native coronary artery without angina pectoris: Secondary | ICD-10-CM | POA: Diagnosis not present

## 2016-08-20 DIAGNOSIS — I5022 Chronic systolic (congestive) heart failure: Secondary | ICD-10-CM | POA: Diagnosis not present

## 2016-08-27 DIAGNOSIS — F0390 Unspecified dementia without behavioral disturbance: Secondary | ICD-10-CM | POA: Diagnosis not present

## 2016-08-27 DIAGNOSIS — I2510 Atherosclerotic heart disease of native coronary artery without angina pectoris: Secondary | ICD-10-CM | POA: Diagnosis not present

## 2016-08-27 DIAGNOSIS — Z452 Encounter for adjustment and management of vascular access device: Secondary | ICD-10-CM | POA: Diagnosis not present

## 2016-08-27 DIAGNOSIS — I10 Essential (primary) hypertension: Secondary | ICD-10-CM | POA: Diagnosis not present

## 2016-08-27 DIAGNOSIS — I110 Hypertensive heart disease with heart failure: Secondary | ICD-10-CM | POA: Diagnosis not present

## 2016-08-27 DIAGNOSIS — I5022 Chronic systolic (congestive) heart failure: Secondary | ICD-10-CM | POA: Diagnosis not present

## 2016-08-27 DIAGNOSIS — I480 Paroxysmal atrial fibrillation: Secondary | ICD-10-CM | POA: Diagnosis not present

## 2016-08-28 ENCOUNTER — Other Ambulatory Visit (HOSPITAL_COMMUNITY): Payer: Self-pay

## 2016-08-28 NOTE — Progress Notes (Signed)
Renewal order for milrinone faxed to [facility] pharmacy as requested.  Renee Pain, RN

## 2016-09-01 ENCOUNTER — Other Ambulatory Visit: Payer: Self-pay | Admitting: Internal Medicine

## 2016-09-03 DIAGNOSIS — I2510 Atherosclerotic heart disease of native coronary artery without angina pectoris: Secondary | ICD-10-CM | POA: Diagnosis not present

## 2016-09-03 DIAGNOSIS — Z452 Encounter for adjustment and management of vascular access device: Secondary | ICD-10-CM | POA: Diagnosis not present

## 2016-09-03 DIAGNOSIS — F0390 Unspecified dementia without behavioral disturbance: Secondary | ICD-10-CM | POA: Diagnosis not present

## 2016-09-03 DIAGNOSIS — I5022 Chronic systolic (congestive) heart failure: Secondary | ICD-10-CM | POA: Diagnosis not present

## 2016-09-03 DIAGNOSIS — I480 Paroxysmal atrial fibrillation: Secondary | ICD-10-CM | POA: Diagnosis not present

## 2016-09-03 DIAGNOSIS — I110 Hypertensive heart disease with heart failure: Secondary | ICD-10-CM | POA: Diagnosis not present

## 2016-09-10 ENCOUNTER — Telehealth (HOSPITAL_COMMUNITY): Payer: Self-pay | Admitting: *Deleted

## 2016-09-10 DIAGNOSIS — I714 Abdominal aortic aneurysm, without rupture: Secondary | ICD-10-CM | POA: Diagnosis not present

## 2016-09-10 DIAGNOSIS — I480 Paroxysmal atrial fibrillation: Secondary | ICD-10-CM | POA: Diagnosis not present

## 2016-09-10 DIAGNOSIS — D649 Anemia, unspecified: Secondary | ICD-10-CM | POA: Diagnosis not present

## 2016-09-10 DIAGNOSIS — F0390 Unspecified dementia without behavioral disturbance: Secondary | ICD-10-CM | POA: Diagnosis not present

## 2016-09-10 DIAGNOSIS — Z452 Encounter for adjustment and management of vascular access device: Secondary | ICD-10-CM | POA: Diagnosis not present

## 2016-09-10 DIAGNOSIS — I2510 Atherosclerotic heart disease of native coronary artery without angina pectoris: Secondary | ICD-10-CM | POA: Diagnosis not present

## 2016-09-10 DIAGNOSIS — I5022 Chronic systolic (congestive) heart failure: Secondary | ICD-10-CM | POA: Diagnosis not present

## 2016-09-10 DIAGNOSIS — I252 Old myocardial infarction: Secondary | ICD-10-CM | POA: Diagnosis not present

## 2016-09-10 DIAGNOSIS — Z0189 Encounter for other specified special examinations: Secondary | ICD-10-CM | POA: Diagnosis not present

## 2016-09-10 DIAGNOSIS — I110 Hypertensive heart disease with heart failure: Secondary | ICD-10-CM | POA: Diagnosis not present

## 2016-09-10 NOTE — Telephone Encounter (Signed)
Pt's husband called very concerned about pt, he states nurse was there and left about 30 min to a hour ago and left bandages on pt, he states those bandages appear to be to tight and are causing pain to pt.  Contacted Carolynn Sayers, RN w/[facility] she will contact pt's [facility] and have her call pt to f/u.  Pt's husband is aware HHRN will be calling him to f/u

## 2016-09-13 ENCOUNTER — Other Ambulatory Visit (HOSPITAL_COMMUNITY): Payer: Self-pay | Admitting: Internal Medicine

## 2016-09-17 DIAGNOSIS — I110 Hypertensive heart disease with heart failure: Secondary | ICD-10-CM | POA: Diagnosis not present

## 2016-09-17 DIAGNOSIS — I480 Paroxysmal atrial fibrillation: Secondary | ICD-10-CM | POA: Diagnosis not present

## 2016-09-17 DIAGNOSIS — Z452 Encounter for adjustment and management of vascular access device: Secondary | ICD-10-CM | POA: Diagnosis not present

## 2016-09-17 DIAGNOSIS — I5022 Chronic systolic (congestive) heart failure: Secondary | ICD-10-CM | POA: Diagnosis not present

## 2016-09-17 DIAGNOSIS — I2510 Atherosclerotic heart disease of native coronary artery without angina pectoris: Secondary | ICD-10-CM | POA: Diagnosis not present

## 2016-09-17 DIAGNOSIS — F0390 Unspecified dementia without behavioral disturbance: Secondary | ICD-10-CM | POA: Diagnosis not present

## 2016-09-24 ENCOUNTER — Telehealth (HOSPITAL_COMMUNITY): Payer: Self-pay | Admitting: *Deleted

## 2016-09-24 DIAGNOSIS — I5022 Chronic systolic (congestive) heart failure: Secondary | ICD-10-CM | POA: Diagnosis not present

## 2016-09-24 DIAGNOSIS — I110 Hypertensive heart disease with heart failure: Secondary | ICD-10-CM | POA: Diagnosis not present

## 2016-09-24 DIAGNOSIS — Z79899 Other long term (current) drug therapy: Secondary | ICD-10-CM | POA: Diagnosis not present

## 2016-09-24 DIAGNOSIS — F0390 Unspecified dementia without behavioral disturbance: Secondary | ICD-10-CM | POA: Diagnosis not present

## 2016-09-24 DIAGNOSIS — I480 Paroxysmal atrial fibrillation: Secondary | ICD-10-CM | POA: Diagnosis not present

## 2016-09-24 DIAGNOSIS — Z452 Encounter for adjustment and management of vascular access device: Secondary | ICD-10-CM | POA: Diagnosis not present

## 2016-09-24 DIAGNOSIS — I2510 Atherosclerotic heart disease of native coronary artery without angina pectoris: Secondary | ICD-10-CM | POA: Diagnosis not present

## 2016-09-24 NOTE — Telephone Encounter (Signed)
[facility] called to report pt's PICC is no longer worker, she states they have done all the trouble shooting they can do and line will not flush or pull back blood so milirinone will not infuse at this time.  She is going to send a RN out to try and start a PIV at this time to get med infusing over night, we will get pt set up for PICC exchange for tomorrow.  If unable to get PIV she will advise pt to report to ER tonight.

## 2016-09-25 ENCOUNTER — Encounter (HOSPITAL_COMMUNITY): Payer: Self-pay | Admitting: Physician Assistant

## 2016-09-25 ENCOUNTER — Ambulatory Visit (HOSPITAL_COMMUNITY)
Admission: RE | Admit: 2016-09-25 | Discharge: 2016-09-25 | Disposition: A | Discharge status: Home / Self Care | Payer: Medicare Other | Source: Ambulatory Visit | Attending: Interventional Radiology | Admitting: Interventional Radiology

## 2016-09-25 ENCOUNTER — Other Ambulatory Visit (HOSPITAL_COMMUNITY): Payer: Self-pay | Admitting: Internal Medicine

## 2016-09-25 DIAGNOSIS — T82594A Other mechanical complication of infusion catheter, initial encounter: Secondary | ICD-10-CM | POA: Diagnosis not present

## 2016-09-25 DIAGNOSIS — I5022 Chronic systolic (congestive) heart failure: Secondary | ICD-10-CM

## 2016-09-25 DIAGNOSIS — I509 Heart failure, unspecified: Secondary | ICD-10-CM | POA: Insufficient documentation

## 2016-09-25 DIAGNOSIS — Y713 Surgical instruments, materials and cardiovascular devices (including sutures) associated with adverse incidents: Secondary | ICD-10-CM | POA: Insufficient documentation

## 2016-09-25 DIAGNOSIS — T82898A Other specified complication of vascular prosthetic devices, implants and grafts, initial encounter: Secondary | ICD-10-CM | POA: Diagnosis not present

## 2016-09-25 HISTORY — PX: IR GENERIC HISTORICAL: IMG1180011

## 2016-09-25 MED ORDER — CHLORHEXIDINE GLUCONATE 4 % EX LIQD
CUTANEOUS | Status: AC
  Filled 2016-09-25: qty 15

## 2016-09-25 MED ORDER — LIDOCAINE HCL 1 % IJ SOLN
INTRAMUSCULAR | Status: AC
  Filled 2016-09-25: qty 20

## 2016-09-25 MED ORDER — HEPARIN SOD (PORK) LOCK FLUSH 100 UNIT/ML IV SOLN
INTRAVENOUS | Status: AC
  Filled 2016-09-25: qty 5

## 2016-09-25 NOTE — Procedures (Signed)
Successful exchange of single lumen PICC line to right basilic vein. Length 33 cm Tip at lower SVC/RA Technical difficulty advancing the wire, probably secondary to venous stenosis.  Ready for use.  Talani Brazee S Terald Jump PA-C

## 2016-09-25 NOTE — Telephone Encounter (Signed)
Pt sch for PICC replacement at 78 today, pt's brother aware to have her at radiology at 11:30

## 2016-09-27 DIAGNOSIS — F0390 Unspecified dementia without behavioral disturbance: Secondary | ICD-10-CM | POA: Diagnosis not present

## 2016-09-27 DIAGNOSIS — I480 Paroxysmal atrial fibrillation: Secondary | ICD-10-CM | POA: Diagnosis not present

## 2016-09-27 DIAGNOSIS — Z452 Encounter for adjustment and management of vascular access device: Secondary | ICD-10-CM | POA: Diagnosis not present

## 2016-09-27 DIAGNOSIS — I5022 Chronic systolic (congestive) heart failure: Secondary | ICD-10-CM | POA: Diagnosis not present

## 2016-09-27 DIAGNOSIS — I110 Hypertensive heart disease with heart failure: Secondary | ICD-10-CM | POA: Diagnosis not present

## 2016-09-27 DIAGNOSIS — I2510 Atherosclerotic heart disease of native coronary artery without angina pectoris: Secondary | ICD-10-CM | POA: Diagnosis not present

## 2016-09-29 ENCOUNTER — Telehealth: Payer: Self-pay | Admitting: Adult Health

## 2016-09-29 NOTE — Telephone Encounter (Signed)
Husband called on Sunday, 09/29/2016 stating that the site where his wife's PICC line was inserted was causing her to itch. The husband is very hard of hearing and argumentative. He wanted me to come to his home and look at the site and give her something for the itching.   After multiple questions and clarifications with the husband, he did state that a home health nurse does come to his home to care for his wife.He had no idea who it was or how to contact them.   I explained that I could not come to his house. He became angry and told me that I did not want to help him. I tried to explain that I would need to find out who the home health company was and try to contact them. I found that [facility] was seeing the patient. I attempted to call them, but they are closed today and there was no weekend contact number.    Patient will need to call the office on Monday. I left a staff message with Kevan Rosebush to call him in the morning.   Jory Sims NP

## 2016-09-30 ENCOUNTER — Telehealth: Payer: Self-pay | Admitting: Adult Health

## 2016-09-30 NOTE — Telephone Encounter (Signed)
Spoke with Mr.Simon, the patient's brother, who is emergency contact. He was able to provide information that the patient's husband has dementia and is very emotional. He has no contact with the patient's husband. He did give me the phone number of the [facility] agency. I called [facility] and they were going to contact the patient about her complaints concerning the PICC line.

## 2016-10-01 ENCOUNTER — Other Ambulatory Visit (HOSPITAL_COMMUNITY): Payer: Self-pay

## 2016-10-01 ENCOUNTER — Telehealth (HOSPITAL_COMMUNITY): Payer: Self-pay | Admitting: *Deleted

## 2016-10-01 ENCOUNTER — Telehealth (HOSPITAL_COMMUNITY): Payer: Self-pay

## 2016-10-01 DIAGNOSIS — I110 Hypertensive heart disease with heart failure: Secondary | ICD-10-CM | POA: Diagnosis not present

## 2016-10-01 DIAGNOSIS — Z452 Encounter for adjustment and management of vascular access device: Secondary | ICD-10-CM | POA: Diagnosis not present

## 2016-10-01 DIAGNOSIS — I2510 Atherosclerotic heart disease of native coronary artery without angina pectoris: Secondary | ICD-10-CM | POA: Diagnosis not present

## 2016-10-01 DIAGNOSIS — I480 Paroxysmal atrial fibrillation: Secondary | ICD-10-CM | POA: Diagnosis not present

## 2016-10-01 DIAGNOSIS — I5022 Chronic systolic (congestive) heart failure: Secondary | ICD-10-CM | POA: Diagnosis not present

## 2016-10-01 DIAGNOSIS — F0390 Unspecified dementia without behavioral disturbance: Secondary | ICD-10-CM | POA: Diagnosis not present

## 2016-10-01 MED ORDER — ONDANSETRON 4 MG PO TBDP: 4.0000 mg | ORAL_TABLET | Freq: Three times a day (TID) | ORAL | 0 refills | Status: DC | PRN

## 2016-10-01 NOTE — Telephone Encounter (Signed)
Baker Janus called to report she f/u w/pt regarding the nausea, pt and husband both have dementia and neither one could remember they had even called and complained about it.  Pt could not even remember when she last eat, drank or used the restroom.  She states pt was not in any distress.  She did discover pt has missed her AM meds twice this week and her PM meds 4 times.  She spoke w/pt's brother, he states this is normal for pt as her memory is so bad.  She states he is agreeable to pick up Zofran for her and bring it over and give to pt when needed if we will send in a new prescription as what she has expired a year ago.  Refill sent in, Baker Janus will advise pt's brother to p/u from pharmacy.

## 2016-10-01 NOTE — Telephone Encounter (Addendum)
Spent approximately 30 minutes on phone with patient's husband about wifes nausea. Advised to give PRN zofran, however, patient's husband states "they have no bottles and has all these loose pills everywhere." Unable to go get refill for zofran as he does not have a key to the car. Will reach out to [facility] to see if they are still active with this patient to come to home to address medication pill box organization and see if there is any zofran available. Advised if he is seriously concerned of patient's medical status to call 911 as [place] may take a few hours to arrive.  Renee Pain, RN

## 2016-10-08 DIAGNOSIS — I2510 Atherosclerotic heart disease of native coronary artery without angina pectoris: Secondary | ICD-10-CM | POA: Diagnosis not present

## 2016-10-08 DIAGNOSIS — I110 Hypertensive heart disease with heart failure: Secondary | ICD-10-CM | POA: Diagnosis not present

## 2016-10-08 DIAGNOSIS — Z452 Encounter for adjustment and management of vascular access device: Secondary | ICD-10-CM | POA: Diagnosis not present

## 2016-10-08 DIAGNOSIS — F0390 Unspecified dementia without behavioral disturbance: Secondary | ICD-10-CM | POA: Diagnosis not present

## 2016-10-08 DIAGNOSIS — I480 Paroxysmal atrial fibrillation: Secondary | ICD-10-CM | POA: Diagnosis not present

## 2016-10-08 DIAGNOSIS — I5022 Chronic systolic (congestive) heart failure: Secondary | ICD-10-CM | POA: Diagnosis not present

## 2016-10-10 ENCOUNTER — Other Ambulatory Visit (HOSPITAL_COMMUNITY): Payer: Self-pay | Admitting: Internal Medicine

## 2016-10-15 DIAGNOSIS — I110 Hypertensive heart disease with heart failure: Secondary | ICD-10-CM | POA: Diagnosis not present

## 2016-10-15 DIAGNOSIS — I2510 Atherosclerotic heart disease of native coronary artery without angina pectoris: Secondary | ICD-10-CM | POA: Diagnosis not present

## 2016-10-15 DIAGNOSIS — F0390 Unspecified dementia without behavioral disturbance: Secondary | ICD-10-CM | POA: Diagnosis not present

## 2016-10-15 DIAGNOSIS — I480 Paroxysmal atrial fibrillation: Secondary | ICD-10-CM | POA: Diagnosis not present

## 2016-10-15 DIAGNOSIS — Z452 Encounter for adjustment and management of vascular access device: Secondary | ICD-10-CM | POA: Diagnosis not present

## 2016-10-15 DIAGNOSIS — I5022 Chronic systolic (congestive) heart failure: Secondary | ICD-10-CM | POA: Diagnosis not present

## 2016-10-18 ENCOUNTER — Other Ambulatory Visit (HOSPITAL_COMMUNITY): Payer: Self-pay | Admitting: Internal Medicine

## 2016-10-18 DIAGNOSIS — D649 Anemia, unspecified: Secondary | ICD-10-CM | POA: Diagnosis not present

## 2016-10-18 DIAGNOSIS — I5022 Chronic systolic (congestive) heart failure: Secondary | ICD-10-CM | POA: Diagnosis not present

## 2016-10-18 DIAGNOSIS — I110 Hypertensive heart disease with heart failure: Secondary | ICD-10-CM | POA: Diagnosis not present

## 2016-10-18 DIAGNOSIS — I714 Abdominal aortic aneurysm, without rupture: Secondary | ICD-10-CM | POA: Diagnosis not present

## 2016-10-18 DIAGNOSIS — I2510 Atherosclerotic heart disease of native coronary artery without angina pectoris: Secondary | ICD-10-CM | POA: Diagnosis not present

## 2016-10-18 DIAGNOSIS — Z452 Encounter for adjustment and management of vascular access device: Secondary | ICD-10-CM | POA: Diagnosis not present

## 2016-10-18 DIAGNOSIS — Z7901 Long term (current) use of anticoagulants: Secondary | ICD-10-CM | POA: Diagnosis not present

## 2016-10-18 DIAGNOSIS — I480 Paroxysmal atrial fibrillation: Secondary | ICD-10-CM | POA: Diagnosis not present

## 2016-10-18 DIAGNOSIS — F0390 Unspecified dementia without behavioral disturbance: Secondary | ICD-10-CM | POA: Diagnosis not present

## 2016-10-18 DIAGNOSIS — I252 Old myocardial infarction: Secondary | ICD-10-CM | POA: Diagnosis not present

## 2016-10-18 DIAGNOSIS — Z79899 Other long term (current) drug therapy: Secondary | ICD-10-CM | POA: Diagnosis not present

## 2016-10-22 DIAGNOSIS — I480 Paroxysmal atrial fibrillation: Secondary | ICD-10-CM | POA: Diagnosis not present

## 2016-10-22 DIAGNOSIS — I5022 Chronic systolic (congestive) heart failure: Secondary | ICD-10-CM | POA: Diagnosis not present

## 2016-10-22 DIAGNOSIS — Z452 Encounter for adjustment and management of vascular access device: Secondary | ICD-10-CM | POA: Diagnosis not present

## 2016-10-22 DIAGNOSIS — I110 Hypertensive heart disease with heart failure: Secondary | ICD-10-CM | POA: Diagnosis not present

## 2016-10-22 DIAGNOSIS — F0390 Unspecified dementia without behavioral disturbance: Secondary | ICD-10-CM | POA: Diagnosis not present

## 2016-10-22 DIAGNOSIS — I2510 Atherosclerotic heart disease of native coronary artery without angina pectoris: Secondary | ICD-10-CM | POA: Diagnosis not present

## 2016-10-23 ENCOUNTER — Encounter (HOSPITAL_COMMUNITY): Payer: Self-pay | Admitting: Internal Medicine

## 2016-10-23 ENCOUNTER — Ambulatory Visit (HOSPITAL_COMMUNITY)
Admission: RE | Admit: 2016-10-23 | Discharge: 2016-10-23 | Disposition: A | Discharge status: Home / Self Care | Payer: Medicare Other | Source: Ambulatory Visit | Attending: Internal Medicine | Admitting: Internal Medicine

## 2016-10-23 VITALS — BP 112/64 | HR 71 | Wt 96.8 lb

## 2016-10-23 DIAGNOSIS — I714 Abdominal aortic aneurysm, without rupture: Secondary | ICD-10-CM | POA: Diagnosis not present

## 2016-10-23 DIAGNOSIS — I5022 Chronic systolic (congestive) heart failure: Secondary | ICD-10-CM

## 2016-10-23 DIAGNOSIS — Z79899 Other long term (current) drug therapy: Secondary | ICD-10-CM | POA: Insufficient documentation

## 2016-10-23 DIAGNOSIS — Z7901 Long term (current) use of anticoagulants: Secondary | ICD-10-CM | POA: Insufficient documentation

## 2016-10-23 DIAGNOSIS — D649 Anemia, unspecified: Secondary | ICD-10-CM | POA: Diagnosis not present

## 2016-10-23 DIAGNOSIS — I110 Hypertensive heart disease with heart failure: Secondary | ICD-10-CM | POA: Insufficient documentation

## 2016-10-23 DIAGNOSIS — I2510 Atherosclerotic heart disease of native coronary artery without angina pectoris: Secondary | ICD-10-CM | POA: Insufficient documentation

## 2016-10-23 DIAGNOSIS — Z8249 Family history of ischemic heart disease and other diseases of the circulatory system: Secondary | ICD-10-CM | POA: Diagnosis not present

## 2016-10-23 DIAGNOSIS — I480 Paroxysmal atrial fibrillation: Secondary | ICD-10-CM

## 2016-10-23 DIAGNOSIS — E785 Hyperlipidemia, unspecified: Secondary | ICD-10-CM | POA: Insufficient documentation

## 2016-10-23 DIAGNOSIS — F0390 Unspecified dementia without behavioral disturbance: Secondary | ICD-10-CM | POA: Insufficient documentation

## 2016-10-23 DIAGNOSIS — I739 Peripheral vascular disease, unspecified: Secondary | ICD-10-CM | POA: Insufficient documentation

## 2016-10-23 NOTE — Addendum Note (Signed)
Encounter addended by: Kerry Dory, CMA on: 10/23/2016 12:03 PM<BR>    Actions taken: Order list changed, Diagnosis association updated, Sign clinical note

## 2016-10-23 NOTE — Patient Instructions (Signed)
Your physician has requested that you have an echocardiogram. Echocardiography is a painless test that uses sound waves to create images of your heart. It provides your doctor with information about the size and shape of your heart and how well your heart's chambers and valves are working. This procedure takes approximately one hour. There are no restrictions for this procedure. ' Your physician recommends that you schedule a follow-up appointment in: 2 months with Dr Haroldine Laws  Do the following things EVERYDAY: 1) Weigh yourself in the morning before breakfast. Write it down and keep it in a log. 2) Take your medicines as prescribed 3) Eat low salt foods-Limit salt (sodium) to 2000 mg per day.  4) Stay as active as you can everyday 5) Limit all fluids for the day to less than 2 liters

## 2016-10-23 NOTE — Progress Notes (Signed)
Patient ID: Jacqueline Reilly, female   DOB: 11/24/1923, 82 y.o.   MRN: [id number]   ADVANCED HF CLINIC NOTE  Patient ID: Jacqueline Reilly, female   DOB: 1924/06/02, 82 y.o.   MRN: [id number] PCP: [name] CHF: Dimitra Woodstock  HPI: Jacqueline Reilly) is an 82 y/o woman with CAD, AAA, systolic HF, mild dementia, afib and CAD. She has had two previous coronary interventions including a cutting balloon to her D2 in 2007 and a DES to her mid LCX in 2010. Her LV dysfunction has been out of proportion to her CAD.  Last echo in 7/15 showed EF 20-25% with severe LV dilation, mild MR, and PA systolic pressure 38 mmHg.   Admitted 7/11-7/24/15 with syncope and dyspnea. She had new onset A-fib/RVR. Troponin was 1.03> 1.59> 2.18 . She was placed on amiodarone and heparin drip. She developed respiratory distress and required intubation. She converted to NSR but later was bradycardiac with NSVT. Amiodarone temporarily stopped but restarted after hyperkalemia resolved. Hyperkalemia was thought to be contributing to bradycardia. Diuresed with IV lasix. Co-ox 29% and started on milrinone which we tried to wean off but she did not tolerate. Discharge weight 87 lbs.   Admitted in November 2016 for Gram-negative bacteremia ([place]) - enterobacter asburiae from PICC. Treated with ceftriaxone and then switched to oral levofloxacin for a total of 21 weeks and PICC replaced.   She returns today for HF follow up with her brother. Remains on milrinone. Overall feeling ok. Denies SOB/PND/Orthopnea. Taking torsemide 40 bid. No BRBPR on Eliquis. She walks with a cane. Says that she is getting all her medicines but husband not charting them in her logbook accurately in their logbook. That said, pill count seems to be fairly accurate. Brother feels like about 10% of her meds may be missed. Weight stable. Not swelling. No problem PICC line.   Echo 8/16  EF 25% with diffuse hypokinesis, mild AS/mild AI, RV mildly dilated with moderately  decreased systolic function.   Labs: (05/23/14) K+ 4.3, creatinine 1.06, digoxin 1.1 (8/15) K 4.7, creatinine 0.94, HCT 33.2 (9/15) K 3.4, creatinine .80 (10/1/0/2015) K 5.0 Creatinine 0.87 Magnesium 2.4  08/11/14 K 5.3 Creatinine 0.8 08/25/14 K 4.4 Cr 0.85 09/04/14   K 4.0 Cr 0.8 pBNP 1763 11/02/14  K 4.2 Cr 0.85 3/16 HCT 32.6, K 4.2, creatinine 0.82 03/03/15  K 3.1 Cr 0.99 BNP 249 8/16 HCT 29.4, K 4.6, creatinine 0.93 07/26/15 Hgb 9.7 K 4.6 creatinine 0.86 02/14/16 Hgb 10.9 K 5.2 creatinine 0.88 06/15/16 Hgb 12.8  K 3.8 creatinine 1.00 09/24/16 K 4.5 creatinine 1.04  ROS: All systems negative except as listed in HPI, PMH and Problem List.  SH:  Social History   Social History  . Marital status: Married    Spouse name: N/A  . Number of children: 1  . Years of education: N/A   Occupational History  . retired    Social History Main Topics  . Smoking status: Never Smoker  . Smokeless tobacco: Never Used  . Alcohol use Yes     Comment: occasionally  . Drug use: No  . Sexual activity: Not Currently   Other Topics Concern  . Not on file   Social History Narrative   She lives in [place], family helps with her care.    FH:  Family History  Problem Relation Age of Onset  . Heart disease Mother   . Heart disease Father   . Heart disease Brother     x  3    Past Medical History:  Diagnosis Date  . AAA (abdominal aortic aneurysm) ([place])   . Atrial fibrillation ([place])   . CHF (congestive heart failure) ([place])   . Chronic systolic heart failure ([place])    a. ECHO (04/2014) EF 20-25%, diff HK, mild MR  . Coronary artery disease 2007   moderate ASCAD of the left system s/p PCI of the D2 and PCI of the left circ 03/2009  . Hyperlipidemia   . Hypertension   . PVD (peripheral vascular disease) ([place])   . Renal artery stenosis ([place])   . Ventricular dysfunction    left; ischemic    Current Outpatient Prescriptions  Medication Sig Dispense Refill  . amiodarone (PACERONE) 200  MG tablet Take 0.5 tablets (100 mg total) by mouth daily. 45 tablet 1  . ELIQUIS 2.5 MG TABS tablet TAKE ONE TABLET BY MOUTH TWICE DAILY 60 tablet 3  . lisinopril (PRINIVIL,ZESTRIL) 2.5 MG tablet TAKE ONE TABLET BY MOUTH ONE TIME DAILY 90 tablet 3  . Multiple Vitamin (MULTIVITAMIN) tablet Take 1 tablet by mouth daily. 30 tablet 1  . Omega-3 Fatty Acids (FISH OIL) 1000 MG CAPS Take 1 capsule by mouth daily.    . sodium chloride 0.9 % SOLN with milrinone 1 MG/ML SOLN 200 mcg/mL Inject into the vein continuous. 0.125 mcg/kg/min    . torsemide (DEMADEX) 20 MG tablet TAKE TWO TABLETS BY MOUTH TWICE DAILY 120 tablet 3   No current facility-administered medications for this encounter.     Vitals:   10/23/16 1117  BP: 112/64  Pulse: 71  SpO2: 97%  Weight: 96 lb 12 oz (43.9 kg)   Filed Weights   10/23/16 1117  Weight: 96 lb 12 oz (43.9 kg)   PHYSICAL EXAM: General:  Elderly appearing. Frail, No resp difficulty; Son present. Ambulated in the clinic with a cane. NAD.  HEENT: normal Neck: supple. JVP 6-7 tid 2+ bilaterally; no bruits. No lymphadenopathy or thryomegaly appreciated. Cor: PMI normal. Regular rate & rhythm. No rubs, gallops.  2/6 SEM RUSB with clear S2. Lungs: clear Abdomen: soft, nontender, nondistended. No hepatosplenomegaly. No bruits or masses. Good bowel sounds. Extremities: no cyanosis, clubbing, rash, edema  RUE PICC site clear Neuro: alert & orientedx3, cranial nerves grossly intact. Moves all 4 extremities w/o difficulty. Affect pleasant.  ASSESSMENT & PLAN:   1) Chronic Systolic Heart Failure: Mixed ischemic/nonischemic cardiomyopathy (degree of LV dysfunction is out of proportion to CAD), EF 25% with moderate RV systolic dysfunction .   Stable NYHA II symptoms and volume status stable on milrinone for over 2 years. There does seem to be some problems with her husband getting her medications but she remains quite stable so probably not ideal but sufficient. Brother will  continue to follow pill counts and report to [place] if they are way off.  - Continue current milrinone gtt. Labs ok.  - Continue torsemide 40 mg bid.  - Continue lisinopril 2.5 mg daily.   - Not on beta blocker with low output.   - Will see back in 2 months with echo 2) Atrial fibrillation: Paroxysmal.  Regular rhythm. Continue apixaban 2.5 mg bid (weight less than 60 kg and age >16). Continue amiodarone 100 mg daily.   3) Limited Code: No CPR or defibrillation 4) CAD: No chest pain. Off ASA with Eliquis.  5) Anemia: Hemoglobin ok from recent lab work.  6) PICC line infection -  Resolved. 7) Impaired memory-  Per PCP. Stable.  Glori Bickers MD  10/23/2016

## 2016-10-24 ENCOUNTER — Other Ambulatory Visit (HOSPITAL_COMMUNITY): Payer: Self-pay | Admitting: Internal Medicine

## 2016-10-29 DIAGNOSIS — I5022 Chronic systolic (congestive) heart failure: Secondary | ICD-10-CM | POA: Diagnosis not present

## 2016-10-29 DIAGNOSIS — I2510 Atherosclerotic heart disease of native coronary artery without angina pectoris: Secondary | ICD-10-CM | POA: Diagnosis not present

## 2016-10-29 DIAGNOSIS — I110 Hypertensive heart disease with heart failure: Secondary | ICD-10-CM | POA: Diagnosis not present

## 2016-10-29 DIAGNOSIS — Z452 Encounter for adjustment and management of vascular access device: Secondary | ICD-10-CM | POA: Diagnosis not present

## 2016-10-29 DIAGNOSIS — I480 Paroxysmal atrial fibrillation: Secondary | ICD-10-CM | POA: Diagnosis not present

## 2016-10-29 DIAGNOSIS — F0390 Unspecified dementia without behavioral disturbance: Secondary | ICD-10-CM | POA: Diagnosis not present

## 2016-11-05 DIAGNOSIS — I2510 Atherosclerotic heart disease of native coronary artery without angina pectoris: Secondary | ICD-10-CM | POA: Diagnosis not present

## 2016-11-05 DIAGNOSIS — I5022 Chronic systolic (congestive) heart failure: Secondary | ICD-10-CM | POA: Diagnosis not present

## 2016-11-05 DIAGNOSIS — Z452 Encounter for adjustment and management of vascular access device: Secondary | ICD-10-CM | POA: Diagnosis not present

## 2016-11-05 DIAGNOSIS — I480 Paroxysmal atrial fibrillation: Secondary | ICD-10-CM | POA: Diagnosis not present

## 2016-11-05 DIAGNOSIS — F0390 Unspecified dementia without behavioral disturbance: Secondary | ICD-10-CM | POA: Diagnosis not present

## 2016-11-05 DIAGNOSIS — I110 Hypertensive heart disease with heart failure: Secondary | ICD-10-CM | POA: Diagnosis not present

## 2016-11-12 DIAGNOSIS — I5022 Chronic systolic (congestive) heart failure: Secondary | ICD-10-CM | POA: Diagnosis not present

## 2016-11-12 DIAGNOSIS — Z452 Encounter for adjustment and management of vascular access device: Secondary | ICD-10-CM | POA: Diagnosis not present

## 2016-11-12 DIAGNOSIS — I2510 Atherosclerotic heart disease of native coronary artery without angina pectoris: Secondary | ICD-10-CM | POA: Diagnosis not present

## 2016-11-12 DIAGNOSIS — F0390 Unspecified dementia without behavioral disturbance: Secondary | ICD-10-CM | POA: Diagnosis not present

## 2016-11-12 DIAGNOSIS — I480 Paroxysmal atrial fibrillation: Secondary | ICD-10-CM | POA: Diagnosis not present

## 2016-11-12 DIAGNOSIS — I110 Hypertensive heart disease with heart failure: Secondary | ICD-10-CM | POA: Diagnosis not present

## 2016-11-19 DIAGNOSIS — Z452 Encounter for adjustment and management of vascular access device: Secondary | ICD-10-CM | POA: Diagnosis not present

## 2016-11-19 DIAGNOSIS — F0390 Unspecified dementia without behavioral disturbance: Secondary | ICD-10-CM | POA: Diagnosis not present

## 2016-11-19 DIAGNOSIS — I2510 Atherosclerotic heart disease of native coronary artery without angina pectoris: Secondary | ICD-10-CM | POA: Diagnosis not present

## 2016-11-19 DIAGNOSIS — I110 Hypertensive heart disease with heart failure: Secondary | ICD-10-CM | POA: Diagnosis not present

## 2016-11-19 DIAGNOSIS — I5022 Chronic systolic (congestive) heart failure: Secondary | ICD-10-CM | POA: Diagnosis not present

## 2016-11-19 DIAGNOSIS — I480 Paroxysmal atrial fibrillation: Secondary | ICD-10-CM | POA: Diagnosis not present

## 2016-11-21 ENCOUNTER — Other Ambulatory Visit (HOSPITAL_COMMUNITY): Payer: Self-pay | Admitting: Internal Medicine

## 2016-11-22 ENCOUNTER — Telehealth: Payer: Self-pay | Admitting: *Deleted

## 2016-11-22 ENCOUNTER — Other Ambulatory Visit (HOSPITAL_COMMUNITY): Payer: Self-pay | Admitting: *Deleted

## 2016-11-22 MED ORDER — APIXABAN 2.5 MG PO TABS: 2.5000 mg | ORAL_TABLET | Freq: Two times a day (BID) | ORAL | 3 refills | Status: AC

## 2016-11-26 DIAGNOSIS — I5022 Chronic systolic (congestive) heart failure: Secondary | ICD-10-CM | POA: Diagnosis not present

## 2016-11-26 DIAGNOSIS — I480 Paroxysmal atrial fibrillation: Secondary | ICD-10-CM | POA: Diagnosis not present

## 2016-11-26 DIAGNOSIS — F0390 Unspecified dementia without behavioral disturbance: Secondary | ICD-10-CM | POA: Diagnosis not present

## 2016-11-26 DIAGNOSIS — Z452 Encounter for adjustment and management of vascular access device: Secondary | ICD-10-CM | POA: Diagnosis not present

## 2016-11-26 DIAGNOSIS — I110 Hypertensive heart disease with heart failure: Secondary | ICD-10-CM | POA: Diagnosis not present

## 2016-11-26 DIAGNOSIS — I509 Heart failure, unspecified: Secondary | ICD-10-CM | POA: Diagnosis not present

## 2016-11-26 DIAGNOSIS — I2510 Atherosclerotic heart disease of native coronary artery without angina pectoris: Secondary | ICD-10-CM | POA: Diagnosis not present

## 2016-11-27 ENCOUNTER — Other Ambulatory Visit (HOSPITAL_COMMUNITY): Payer: Self-pay | Admitting: Internal Medicine

## 2016-11-27 NOTE — Telephone Encounter (Signed)
apixaban (ELIQUIS) 2.5 MG TABS tablet  Medication  Date: 11/22/2016 Department: [facility] Ordering/Authorizing: Jolaine Artist, MD  Order Providers   Prescribing Provider Encounter Provider  Jolaine Artist, MD Harvie Junior, CMA  Medication Detail    Disp Refills Start End   apixaban (ELIQUIS) 2.5 MG TABS tablet 180 tablet 3 11/22/2016    Sig - Route: Take 1 tablet (2.5 mg total) by mouth 2 (two) times daily. - Oral   E-Prescribing Status: Receipt confirmed by pharmacy (11/22/2016 12:05 PM EST)   Pharmacy   [facility]

## 2016-12-02 ENCOUNTER — Other Ambulatory Visit (HOSPITAL_COMMUNITY): Payer: Self-pay | Admitting: Internal Medicine

## 2016-12-03 DIAGNOSIS — I10 Essential (primary) hypertension: Secondary | ICD-10-CM | POA: Diagnosis not present

## 2016-12-03 DIAGNOSIS — I2510 Atherosclerotic heart disease of native coronary artery without angina pectoris: Secondary | ICD-10-CM | POA: Diagnosis not present

## 2016-12-03 DIAGNOSIS — I110 Hypertensive heart disease with heart failure: Secondary | ICD-10-CM | POA: Diagnosis not present

## 2016-12-03 DIAGNOSIS — Z452 Encounter for adjustment and management of vascular access device: Secondary | ICD-10-CM | POA: Diagnosis not present

## 2016-12-03 DIAGNOSIS — I480 Paroxysmal atrial fibrillation: Secondary | ICD-10-CM | POA: Diagnosis not present

## 2016-12-03 DIAGNOSIS — E063 Autoimmune thyroiditis: Secondary | ICD-10-CM | POA: Diagnosis not present

## 2016-12-03 DIAGNOSIS — F0390 Unspecified dementia without behavioral disturbance: Secondary | ICD-10-CM | POA: Diagnosis not present

## 2016-12-03 DIAGNOSIS — I5022 Chronic systolic (congestive) heart failure: Secondary | ICD-10-CM | POA: Diagnosis not present

## 2016-12-06 ENCOUNTER — Other Ambulatory Visit (HOSPITAL_COMMUNITY): Payer: Self-pay | Admitting: Internal Medicine

## 2016-12-10 DIAGNOSIS — Z452 Encounter for adjustment and management of vascular access device: Secondary | ICD-10-CM | POA: Diagnosis not present

## 2016-12-10 DIAGNOSIS — I2510 Atherosclerotic heart disease of native coronary artery without angina pectoris: Secondary | ICD-10-CM | POA: Diagnosis not present

## 2016-12-10 DIAGNOSIS — I110 Hypertensive heart disease with heart failure: Secondary | ICD-10-CM | POA: Diagnosis not present

## 2016-12-10 DIAGNOSIS — I480 Paroxysmal atrial fibrillation: Secondary | ICD-10-CM | POA: Diagnosis not present

## 2016-12-10 DIAGNOSIS — I5022 Chronic systolic (congestive) heart failure: Secondary | ICD-10-CM | POA: Diagnosis not present

## 2016-12-10 DIAGNOSIS — F0390 Unspecified dementia without behavioral disturbance: Secondary | ICD-10-CM | POA: Diagnosis not present

## 2016-12-16 ENCOUNTER — Telehealth (HOSPITAL_COMMUNITY): Payer: Self-pay | Admitting: *Deleted

## 2016-12-16 DIAGNOSIS — Z452 Encounter for adjustment and management of vascular access device: Secondary | ICD-10-CM | POA: Diagnosis not present

## 2016-12-16 DIAGNOSIS — I480 Paroxysmal atrial fibrillation: Secondary | ICD-10-CM | POA: Diagnosis not present

## 2016-12-16 DIAGNOSIS — F0390 Unspecified dementia without behavioral disturbance: Secondary | ICD-10-CM | POA: Diagnosis not present

## 2016-12-16 DIAGNOSIS — I5022 Chronic systolic (congestive) heart failure: Secondary | ICD-10-CM | POA: Diagnosis not present

## 2016-12-16 DIAGNOSIS — I110 Hypertensive heart disease with heart failure: Secondary | ICD-10-CM | POA: Diagnosis not present

## 2016-12-16 DIAGNOSIS — I2510 Atherosclerotic heart disease of native coronary artery without angina pectoris: Secondary | ICD-10-CM | POA: Diagnosis not present

## 2016-12-16 NOTE — Telephone Encounter (Signed)
Jenny Reichmann, RN with [facility] called asking if we should renew patient's milrinone drip.  Patient has appointment in office next week, we will continue drip for now until Dr. Mahalia Longest can see patient at appointment time.

## 2016-12-17 DIAGNOSIS — I480 Paroxysmal atrial fibrillation: Secondary | ICD-10-CM | POA: Diagnosis not present

## 2016-12-17 DIAGNOSIS — I5022 Chronic systolic (congestive) heart failure: Secondary | ICD-10-CM | POA: Diagnosis not present

## 2016-12-17 DIAGNOSIS — I2510 Atherosclerotic heart disease of native coronary artery without angina pectoris: Secondary | ICD-10-CM | POA: Diagnosis not present

## 2016-12-17 DIAGNOSIS — Z452 Encounter for adjustment and management of vascular access device: Secondary | ICD-10-CM | POA: Diagnosis not present

## 2016-12-17 DIAGNOSIS — I252 Old myocardial infarction: Secondary | ICD-10-CM | POA: Diagnosis not present

## 2016-12-17 DIAGNOSIS — F0390 Unspecified dementia without behavioral disturbance: Secondary | ICD-10-CM | POA: Diagnosis not present

## 2016-12-17 DIAGNOSIS — D649 Anemia, unspecified: Secondary | ICD-10-CM | POA: Diagnosis not present

## 2016-12-17 DIAGNOSIS — Z7901 Long term (current) use of anticoagulants: Secondary | ICD-10-CM | POA: Diagnosis not present

## 2016-12-17 DIAGNOSIS — I714 Abdominal aortic aneurysm, without rupture: Secondary | ICD-10-CM | POA: Diagnosis not present

## 2016-12-17 DIAGNOSIS — Z79899 Other long term (current) drug therapy: Secondary | ICD-10-CM | POA: Diagnosis not present

## 2016-12-17 DIAGNOSIS — I110 Hypertensive heart disease with heart failure: Secondary | ICD-10-CM | POA: Diagnosis not present

## 2016-12-18 ENCOUNTER — Telehealth (HOSPITAL_COMMUNITY): Payer: Self-pay | Admitting: *Deleted

## 2016-12-18 NOTE — Telephone Encounter (Signed)
Gina with [facility] called concerned about continuing their services with patient.  She stated Jenny Reichmann the RN feels its unsafe to continue care with patient as she is confused and unable to care for self.  Patient's brother helps with patient's medications but Jenny Reichmann reports that he is unable to care for patient as well.  Barnett Applebaum called saying they were going to have a conference call regarding patient care and asked if we wanted to participate in the call.    I explained to Barnett Applebaum that I spoke with Jenny Reichmann, RN regarding her concerns in caring for patient and that patient had an appointment in our clinic with Dr. Haroldine Laws next Wednesday February 28th in which we could discuss  milrinone drip and home care services.  Barnett Applebaum was unaware of this and said yes we can evaluate patient then and let her know what the plan of care will be moving forward.

## 2016-12-20 ENCOUNTER — Other Ambulatory Visit (HOSPITAL_COMMUNITY): Payer: Self-pay | Admitting: Internal Medicine

## 2016-12-24 DIAGNOSIS — F0390 Unspecified dementia without behavioral disturbance: Secondary | ICD-10-CM | POA: Diagnosis not present

## 2016-12-24 DIAGNOSIS — I480 Paroxysmal atrial fibrillation: Secondary | ICD-10-CM | POA: Diagnosis not present

## 2016-12-24 DIAGNOSIS — Z452 Encounter for adjustment and management of vascular access device: Secondary | ICD-10-CM | POA: Diagnosis not present

## 2016-12-24 DIAGNOSIS — I5022 Chronic systolic (congestive) heart failure: Secondary | ICD-10-CM | POA: Diagnosis not present

## 2016-12-24 DIAGNOSIS — I110 Hypertensive heart disease with heart failure: Secondary | ICD-10-CM | POA: Diagnosis not present

## 2016-12-24 DIAGNOSIS — I2510 Atherosclerotic heart disease of native coronary artery without angina pectoris: Secondary | ICD-10-CM | POA: Diagnosis not present

## 2016-12-25 ENCOUNTER — Encounter (HOSPITAL_COMMUNITY): Payer: Medicare Other | Admitting: Internal Medicine

## 2016-12-25 ENCOUNTER — Encounter (HOSPITAL_COMMUNITY): Payer: Self-pay | Admitting: Internal Medicine

## 2016-12-25 ENCOUNTER — Ambulatory Visit (HOSPITAL_COMMUNITY)
Admission: RE | Admit: 2016-12-25 | Discharge: 2016-12-25 | Disposition: A | Discharge status: Home / Self Care | Payer: Medicare Other | Source: Ambulatory Visit | Attending: Internal Medicine | Admitting: Internal Medicine

## 2016-12-25 ENCOUNTER — Ambulatory Visit (HOSPITAL_BASED_OUTPATIENT_CLINIC_OR_DEPARTMENT_OTHER)
Admission: RE | Admit: 2016-12-25 | Discharge: 2016-12-25 | Disposition: A | Discharge status: Home / Self Care | Payer: Medicare Other | Source: Ambulatory Visit | Attending: Internal Medicine | Admitting: Internal Medicine

## 2016-12-25 ENCOUNTER — Ambulatory Visit (HOSPITAL_COMMUNITY): Payer: Medicare Other

## 2016-12-25 VITALS — BP 122/72 | HR 67 | Wt 98.0 lb

## 2016-12-25 DIAGNOSIS — I110 Hypertensive heart disease with heart failure: Secondary | ICD-10-CM | POA: Diagnosis not present

## 2016-12-25 DIAGNOSIS — I428 Other cardiomyopathies: Secondary | ICD-10-CM | POA: Diagnosis not present

## 2016-12-25 DIAGNOSIS — I2510 Atherosclerotic heart disease of native coronary artery without angina pectoris: Secondary | ICD-10-CM

## 2016-12-25 DIAGNOSIS — Z8249 Family history of ischemic heart disease and other diseases of the circulatory system: Secondary | ICD-10-CM | POA: Diagnosis not present

## 2016-12-25 DIAGNOSIS — I739 Peripheral vascular disease, unspecified: Secondary | ICD-10-CM | POA: Insufficient documentation

## 2016-12-25 DIAGNOSIS — E785 Hyperlipidemia, unspecified: Secondary | ICD-10-CM | POA: Diagnosis not present

## 2016-12-25 DIAGNOSIS — Z7901 Long term (current) use of anticoagulants: Secondary | ICD-10-CM | POA: Insufficient documentation

## 2016-12-25 DIAGNOSIS — Z79899 Other long term (current) drug therapy: Secondary | ICD-10-CM | POA: Insufficient documentation

## 2016-12-25 DIAGNOSIS — D649 Anemia, unspecified: Secondary | ICD-10-CM | POA: Diagnosis not present

## 2016-12-25 DIAGNOSIS — I255 Ischemic cardiomyopathy: Secondary | ICD-10-CM | POA: Insufficient documentation

## 2016-12-25 DIAGNOSIS — I714 Abdominal aortic aneurysm, without rupture: Secondary | ICD-10-CM | POA: Diagnosis not present

## 2016-12-25 DIAGNOSIS — R413 Other amnesia: Secondary | ICD-10-CM | POA: Insufficient documentation

## 2016-12-25 DIAGNOSIS — I2583 Coronary atherosclerosis due to lipid rich plaque: Secondary | ICD-10-CM | POA: Diagnosis not present

## 2016-12-25 DIAGNOSIS — Z955 Presence of coronary angioplasty implant and graft: Secondary | ICD-10-CM | POA: Insufficient documentation

## 2016-12-25 DIAGNOSIS — I5022 Chronic systolic (congestive) heart failure: Secondary | ICD-10-CM | POA: Diagnosis not present

## 2016-12-25 DIAGNOSIS — I480 Paroxysmal atrial fibrillation: Secondary | ICD-10-CM | POA: Diagnosis not present

## 2016-12-25 LAB — ECHOCARDIOGRAM COMPLETE
AO mean calculated velocity dopler: 147 cm/s
AV Area VTI index: 0.65 cm2/m2
AV Area VTI: 0.9 cm2
AV Area mean vel: 0.82 cm2
AV Mean grad: 10 mmHg
AV Peak grad: 17 mmHg
AV VEL mean LVOT/AV: 0.26
AV area mean vel ind: 0.61 cm2/m2
AV peak Index: 0.67
AV pk vel: 206 cm/s
AV vel: 0.88
Ao pk vel: 0.29 m/s
E decel time: 182 msec
E/e' ratio: 5.1
FS: 17 % — AB (ref 28–44)
IVS/LV PW RATIO, ED: 0.77
LA ID, A-P, ES: 35 mm
LA diam end sys: 35 mm
LA diam index: 2.59 cm/m2
LA vol A4C: 36.4 ml
LA vol index: 31.2 mL/m2
LA vol: 42.1 mL
LV E/e' medial: 5.1
LV E/e'average: 5.1
LV PW d: 8.92 mm — AB (ref 0.6–1.1)
LV e' LATERAL: 8.37 cm/s
LVOT SV: 33 mL
LVOT VTI: 10.5 cm
LVOT area: 3.14 cm2
LVOT diameter: 20 mm
LVOT peak VTI: 0.28 cm
LVOT peak grad rest: 1 mmHg
LVOT peak vel: 59.2 cm/s
Lateral S' vel: 7.18 cm/s
MV Dec: 182
MV pk A vel: 102 m/s
MV pk E vel: 42.7 m/s
TAPSE: 23.9 mm
TDI e' lateral: 8.37
TDI e' medial: 2.87
VTI: 37.3 cm
Valve area index: 0.65
Valve area: 0.88 cm2

## 2016-12-25 NOTE — Progress Notes (Signed)
  Echocardiogram 2D Echocardiogram has been performed.  Donata Clay 12/25/2016, 11:45 AM

## 2016-12-25 NOTE — Patient Instructions (Signed)
Your physician recommends that you schedule a follow-up appointment in: 3 months.  

## 2016-12-25 NOTE — Addendum Note (Signed)
Encounter addended by: Scarlette Calico, RN on: 12/25/2016 12:01 PM<BR>    Actions taken: Sign clinical note

## 2016-12-25 NOTE — Progress Notes (Signed)
Patient ID: Jacqueline Reilly, female   DOB: 1924/01/31, 83 y.o.   MRN: [id number]   ADVANCED HF CLINIC NOTE  Patient ID: Jacqueline Reilly, female   DOB: 05/03/1924, 83 y.o.   MRN: [id number] PCP: [name] CHF: Rube Sanchez  HPI: [name] is an 83 y/o woman with CAD, AAA, systolic HF, mild dementia, afib and CAD. She has had two previous coronary interventions including a cutting balloon to her D2 in 2007 and a DES to her mid LCX in 2010. Her LV dysfunction has been out of proportion to her CAD.  Last echo in 7/15 showed EF 20-25% with severe LV dilation, mild MR, and PA systolic pressure 38 mmHg.   Admitted 7/11-7/24/15 with syncope and dyspnea. She had new onset A-fib/RVR. Troponin was 1.03> 1.59> 2.18 . She was placed on amiodarone and heparin drip. She developed respiratory distress and required intubation. She converted to NSR but later was bradycardiac with NSVT. Amiodarone temporarily stopped but restarted after hyperkalemia resolved. Hyperkalemia was thought to be contributing to bradycardia. Diuresed with IV lasix. Co-ox 29% and started on milrinone which we tried to wean off but she did not tolerate. Discharge weight 87 lbs.   Admitted in November 2016 for Gram-negative bacteremia (Jacqueline Reilly) - enterobacter asburiae from PICC. Treated with ceftriaxone and then switched to oral levofloxacin for a total of 21 weeks and PICC replaced.   She returns today for HF follow up with her husband. Remains on milrinone. Overall feeling ok. Confused at times but manages ok. Walks with her cane as needed. Denies SOB/PND/Orthopnea/edema. No CP. Taking torsemide 40 bid. Says that she is getting all her medicines ok as her brother helps prepare them. No bleeding with Eliquis. Weight stable.  No problem PICC line.   Echo 8/16  EF 25% with diffuse hypokinesis, mild AS/mild AI, RV mildly dilated with moderately decreased systolic function.  Echo today reviewed personally EF 25-30%  Labs: (05/23/14) K+ 4.3,  creatinine 1.06, digoxin 1.1 (8/15) K 4.7, creatinine 0.94, HCT 33.2 (9/15) K 3.4, creatinine .80 (10/1/0/2015) K 5.0 Creatinine 0.87 Magnesium 2.4  08/11/14 K 5.3 Creatinine 0.8 08/25/14 K 4.4 Cr 0.85 09/04/14   K 4.0 Cr 0.8 pBNP 1763 11/02/14  K 4.2 Cr 0.85 3/16 HCT 32.6, K 4.2, creatinine 0.82 03/03/15  K 3.1 Cr 0.99 BNP 249 8/16 HCT 29.4, K 4.6, creatinine 0.93 07/26/15 Hgb 9.7 K 4.6 creatinine 0.86 02/14/16 Hgb 10.9 K 5.2 creatinine 0.88 06/15/16 Hgb 12.8  K 3.8 creatinine 1.00 09/24/16 K 4.5 creatinine 1.04  ROS: All systems negative except as listed in HPI, PMH and Problem List.  SH:  Social History   Social History  . Marital status: Married    Spouse name: N/A  . Number of children: 1  . Years of education: N/A   Occupational History  . retired    Social History Main Topics  . Smoking status: Never Smoker  . Smokeless tobacco: Never Used  . Alcohol use Yes     Comment: occasionally  . Drug use: No  . Sexual activity: Not Currently   Other Topics Concern  . Not on file   Social History Narrative   She lives in [place], family helps with her care.    FH:  Family History  Problem Relation Age of Onset  . Heart disease Mother   . Heart disease Father   . Heart disease Brother     x 3    Past Medical History:  Diagnosis Date  .  AAA (abdominal aortic aneurysm) ([place])   . Atrial fibrillation ([place])   . CHF (congestive heart failure) ([place] )   . Chronic systolic heart failure ([place])    a. ECHO (04/2014) EF 20-25%, diff HK, mild MR  . Coronary artery disease 2007   moderate ASCAD of the left system s/p PCI of the D2 and PCI of the left circ 03/2009  . Hyperlipidemia   . Hypertension   . PVD (peripheral vascular disease) ([place])   . Renal artery stenosis ([place])   . Ventricular dysfunction    left; ischemic    Current Outpatient Prescriptions  Medication Sig Dispense Refill  . amiodarone (PACERONE) 200 MG tablet Take 0.5 tablets (100 mg total) by mouth daily.  45 tablet 1  . apixaban (ELIQUIS) 2.5 MG TABS tablet Take 1 tablet (2.5 mg total) by mouth 2 (two) times daily. 180 tablet 3  . lisinopril (PRINIVIL,ZESTRIL) 2.5 MG tablet TAKE ONE TABLET BY MOUTH ONE TIME DAILY 90 tablet 3  . Multiple Vitamin (MULTIVITAMIN) tablet Take 1 tablet by mouth daily. 30 tablet 1  . Omega-3 Fatty Acids (FISH OIL) 1000 MG CAPS Take 1 capsule by mouth daily.    . sodium chloride 0.9 % SOLN with milrinone 1 MG/ML SOLN 200 mcg/mL Inject into the vein continuous. 0.125 mcg/kg/min    . torsemide (DEMADEX) 20 MG tablet TAKE TWO TABLETS BY MOUTH TWICE DAILY 120 tablet 3   No current facility-administered medications for this encounter.     Vitals:   12/25/16 1134  BP: 122/72  Pulse: 67  SpO2: 99%  Weight: 98 lb (44.5 kg)   PHYSICAL EXAM: General:  Elderly appearing. Frail, No resp difficulty; Husband  present.  HEENT: normal Neck: supple. JVP 7. 2+ bilaterally; no bruits. No lymphadenopathy or thryomegaly appreciated. Cor: PMI normal. Regular rate & rhythm. No rubs, gallops.  2/6 SEM RUSB  Lungs: clear Abdomen: soft, nontender, nondistended. No hepatosplenomegaly. No bruits or masses. Good bowel sounds. Extremities: no cyanosis, clubbing, rash, edema  RUE PICC site clear. No erythema  Neuro: alert & orientedx3, cranial nerves grossly intact. Moves all 4 extremities w/o difficulty. Affect pleasant.  ASSESSMENT & PLAN:   1) Chronic Systolic Heart Failure: Mixed ischemic/nonischemic cardiomyopathy (degree of LV dysfunction is out of proportion to CAD), EF 25% with moderate RV systolic dysfunction .  Echo today 12/25/16 EF 25-30% --Stable NYHA II symptoms and volume status stable on milrinone for more than 2 years.  - Continue current milrinone gtt. Continue [facility] following.  - Continue torsemide 40 mg bid.  - Continue lisinopril 2.5 mg daily.   - Not on beta blocker with low output.   - Echo stable today. Reviewed personally EF 25-30% 2) Atrial fibrillation:  Paroxysmal.  Regular rhythm. Continue apixaban 2.5 mg bid (weight less than 60 kg and age >41). Continue amiodarone 100 mg daily.   3) Limited Code: No CPR or defibrillation 4) CAD: No chest pain. Off ASA with Eliquis.  5) Anemia: Hemoglobin ok from recent lab work.  6) Impaired memory-  Per PCP. Overall stable     Glori Bickers MD  12/25/2016

## 2016-12-31 DIAGNOSIS — I480 Paroxysmal atrial fibrillation: Secondary | ICD-10-CM | POA: Diagnosis not present

## 2016-12-31 DIAGNOSIS — Z452 Encounter for adjustment and management of vascular access device: Secondary | ICD-10-CM | POA: Diagnosis not present

## 2016-12-31 DIAGNOSIS — I5022 Chronic systolic (congestive) heart failure: Secondary | ICD-10-CM | POA: Diagnosis not present

## 2016-12-31 DIAGNOSIS — I2510 Atherosclerotic heart disease of native coronary artery without angina pectoris: Secondary | ICD-10-CM | POA: Diagnosis not present

## 2016-12-31 DIAGNOSIS — F0390 Unspecified dementia without behavioral disturbance: Secondary | ICD-10-CM | POA: Diagnosis not present

## 2016-12-31 DIAGNOSIS — R6889 Other general symptoms and signs: Secondary | ICD-10-CM | POA: Diagnosis not present

## 2016-12-31 DIAGNOSIS — I110 Hypertensive heart disease with heart failure: Secondary | ICD-10-CM | POA: Diagnosis not present

## 2017-01-02 ENCOUNTER — Other Ambulatory Visit (HOSPITAL_COMMUNITY): Payer: Self-pay | Admitting: Internal Medicine

## 2017-01-07 DIAGNOSIS — Z452 Encounter for adjustment and management of vascular access device: Secondary | ICD-10-CM | POA: Diagnosis not present

## 2017-01-07 DIAGNOSIS — I110 Hypertensive heart disease with heart failure: Secondary | ICD-10-CM | POA: Diagnosis not present

## 2017-01-07 DIAGNOSIS — I480 Paroxysmal atrial fibrillation: Secondary | ICD-10-CM | POA: Diagnosis not present

## 2017-01-07 DIAGNOSIS — F0390 Unspecified dementia without behavioral disturbance: Secondary | ICD-10-CM | POA: Diagnosis not present

## 2017-01-07 DIAGNOSIS — I5022 Chronic systolic (congestive) heart failure: Secondary | ICD-10-CM | POA: Diagnosis not present

## 2017-01-07 DIAGNOSIS — I2510 Atherosclerotic heart disease of native coronary artery without angina pectoris: Secondary | ICD-10-CM | POA: Diagnosis not present

## 2017-01-09 ENCOUNTER — Other Ambulatory Visit (HOSPITAL_COMMUNITY): Payer: Self-pay | Admitting: Internal Medicine

## 2017-01-14 DIAGNOSIS — Z452 Encounter for adjustment and management of vascular access device: Secondary | ICD-10-CM | POA: Diagnosis not present

## 2017-01-14 DIAGNOSIS — I2510 Atherosclerotic heart disease of native coronary artery without angina pectoris: Secondary | ICD-10-CM | POA: Diagnosis not present

## 2017-01-14 DIAGNOSIS — I110 Hypertensive heart disease with heart failure: Secondary | ICD-10-CM | POA: Diagnosis not present

## 2017-01-14 DIAGNOSIS — I5022 Chronic systolic (congestive) heart failure: Secondary | ICD-10-CM | POA: Diagnosis not present

## 2017-01-14 DIAGNOSIS — I480 Paroxysmal atrial fibrillation: Secondary | ICD-10-CM | POA: Diagnosis not present

## 2017-01-14 DIAGNOSIS — F0390 Unspecified dementia without behavioral disturbance: Secondary | ICD-10-CM | POA: Diagnosis not present

## 2017-01-14 DIAGNOSIS — E063 Autoimmune thyroiditis: Secondary | ICD-10-CM | POA: Diagnosis not present

## 2017-01-16 ENCOUNTER — Other Ambulatory Visit: Payer: Self-pay | Admitting: Cardiology

## 2017-01-20 ENCOUNTER — Other Ambulatory Visit (HOSPITAL_COMMUNITY): Payer: Self-pay | Admitting: Internal Medicine

## 2017-01-21 DIAGNOSIS — I480 Paroxysmal atrial fibrillation: Secondary | ICD-10-CM | POA: Diagnosis not present

## 2017-01-21 DIAGNOSIS — Z452 Encounter for adjustment and management of vascular access device: Secondary | ICD-10-CM | POA: Diagnosis not present

## 2017-01-21 DIAGNOSIS — I5022 Chronic systolic (congestive) heart failure: Secondary | ICD-10-CM | POA: Diagnosis not present

## 2017-01-21 DIAGNOSIS — I110 Hypertensive heart disease with heart failure: Secondary | ICD-10-CM | POA: Diagnosis not present

## 2017-01-21 DIAGNOSIS — F0390 Unspecified dementia without behavioral disturbance: Secondary | ICD-10-CM | POA: Diagnosis not present

## 2017-01-21 DIAGNOSIS — I2510 Atherosclerotic heart disease of native coronary artery without angina pectoris: Secondary | ICD-10-CM | POA: Diagnosis not present

## 2017-01-24 ENCOUNTER — Other Ambulatory Visit (HOSPITAL_COMMUNITY): Payer: Self-pay | Admitting: Internal Medicine

## 2017-01-27 ENCOUNTER — Telehealth (HOSPITAL_COMMUNITY): Payer: Self-pay | Admitting: *Deleted

## 2017-01-27 DIAGNOSIS — I5022 Chronic systolic (congestive) heart failure: Secondary | ICD-10-CM | POA: Diagnosis not present

## 2017-01-27 DIAGNOSIS — I2510 Atherosclerotic heart disease of native coronary artery without angina pectoris: Secondary | ICD-10-CM | POA: Diagnosis not present

## 2017-01-27 DIAGNOSIS — I480 Paroxysmal atrial fibrillation: Secondary | ICD-10-CM | POA: Diagnosis not present

## 2017-01-27 DIAGNOSIS — I110 Hypertensive heart disease with heart failure: Secondary | ICD-10-CM | POA: Diagnosis not present

## 2017-01-27 DIAGNOSIS — F0390 Unspecified dementia without behavioral disturbance: Secondary | ICD-10-CM | POA: Diagnosis not present

## 2017-01-27 DIAGNOSIS — Z452 Encounter for adjustment and management of vascular access device: Secondary | ICD-10-CM | POA: Diagnosis not present

## 2017-01-27 NOTE — Telephone Encounter (Signed)
Pt's husband called to report pt's PICC line site is itching her, he states she was up most of the night due to the itching.  I have called [facility] and they will send an RN out to see pt today, pt's husband is aware

## 2017-01-27 NOTE — Telephone Encounter (Signed)
[facility] called to report she went out and evaluated pt's site, she states it looked fine, dsg was clean/dry/intact, no redness or other issues.

## 2017-01-28 DIAGNOSIS — Z452 Encounter for adjustment and management of vascular access device: Secondary | ICD-10-CM | POA: Diagnosis not present

## 2017-01-28 DIAGNOSIS — I5022 Chronic systolic (congestive) heart failure: Secondary | ICD-10-CM | POA: Diagnosis not present

## 2017-01-28 DIAGNOSIS — F0390 Unspecified dementia without behavioral disturbance: Secondary | ICD-10-CM | POA: Diagnosis not present

## 2017-01-28 DIAGNOSIS — I2510 Atherosclerotic heart disease of native coronary artery without angina pectoris: Secondary | ICD-10-CM | POA: Diagnosis not present

## 2017-01-28 DIAGNOSIS — R1013 Epigastric pain: Secondary | ICD-10-CM | POA: Diagnosis not present

## 2017-01-28 DIAGNOSIS — R7301 Impaired fasting glucose: Secondary | ICD-10-CM | POA: Diagnosis not present

## 2017-01-28 DIAGNOSIS — I110 Hypertensive heart disease with heart failure: Secondary | ICD-10-CM | POA: Diagnosis not present

## 2017-01-28 DIAGNOSIS — I480 Paroxysmal atrial fibrillation: Secondary | ICD-10-CM | POA: Diagnosis not present

## 2017-01-31 ENCOUNTER — Other Ambulatory Visit: Payer: Self-pay | Admitting: Internal Medicine

## 2017-02-03 ENCOUNTER — Telehealth (HOSPITAL_COMMUNITY): Payer: Self-pay | Admitting: Cardiology

## 2017-02-03 MED ORDER — BUSPIRONE HCL 5 MG PO TABS: 5.0000 mg | ORAL_TABLET | Freq: Every day | ORAL | 0 refills | Status: DC | PRN

## 2017-02-03 NOTE — Telephone Encounter (Signed)
Pt's husband and nephew by the office, they are concerned about telling pt her brother pasted away this am and are requesting med to help keep her calm.  Ok to prescribe Buspar 5 mg as needed, 2 tabs sent to pharmacy.

## 2017-02-04 DIAGNOSIS — I2510 Atherosclerotic heart disease of native coronary artery without angina pectoris: Secondary | ICD-10-CM | POA: Diagnosis not present

## 2017-02-04 DIAGNOSIS — F0390 Unspecified dementia without behavioral disturbance: Secondary | ICD-10-CM | POA: Diagnosis not present

## 2017-02-04 DIAGNOSIS — I480 Paroxysmal atrial fibrillation: Secondary | ICD-10-CM | POA: Diagnosis not present

## 2017-02-04 DIAGNOSIS — I5022 Chronic systolic (congestive) heart failure: Secondary | ICD-10-CM | POA: Diagnosis not present

## 2017-02-04 DIAGNOSIS — Z452 Encounter for adjustment and management of vascular access device: Secondary | ICD-10-CM | POA: Diagnosis not present

## 2017-02-04 DIAGNOSIS — I110 Hypertensive heart disease with heart failure: Secondary | ICD-10-CM | POA: Diagnosis not present

## 2017-02-09 ENCOUNTER — Telehealth: Payer: Self-pay | Admitting: Cardiology

## 2017-02-09 NOTE — Telephone Encounter (Signed)
Patient's husband called stating that his wifes IV pump is alarming.  She is on continuous milrinone infusion.  Patient thought he was calling [facility].  I contacted [facility] and they will call patient to determine if there is a problem with the IV pump or with her access line and if they cannot solve the problem the patient will be sent to the ER.

## 2017-02-11 ENCOUNTER — Telehealth (HOSPITAL_COMMUNITY): Payer: Self-pay | Admitting: *Deleted

## 2017-02-11 DIAGNOSIS — I5022 Chronic systolic (congestive) heart failure: Secondary | ICD-10-CM | POA: Diagnosis not present

## 2017-02-11 DIAGNOSIS — I110 Hypertensive heart disease with heart failure: Secondary | ICD-10-CM | POA: Diagnosis not present

## 2017-02-11 DIAGNOSIS — F0390 Unspecified dementia without behavioral disturbance: Secondary | ICD-10-CM | POA: Diagnosis not present

## 2017-02-11 DIAGNOSIS — Z79899 Other long term (current) drug therapy: Secondary | ICD-10-CM | POA: Diagnosis not present

## 2017-02-11 DIAGNOSIS — Z452 Encounter for adjustment and management of vascular access device: Secondary | ICD-10-CM | POA: Diagnosis not present

## 2017-02-11 DIAGNOSIS — I2510 Atherosclerotic heart disease of native coronary artery without angina pectoris: Secondary | ICD-10-CM | POA: Diagnosis not present

## 2017-02-11 DIAGNOSIS — I480 Paroxysmal atrial fibrillation: Secondary | ICD-10-CM | POA: Diagnosis not present

## 2017-02-11 NOTE — Telephone Encounter (Signed)
Candie Mile, RN with [facility] called requested for verbal orders to send social worker out to patient's house.  Jenny Reichmann stated that the son was in from [place] and there was severe yelling going on in the house.  She was concerned for patient safety.  Verbal orders given.

## 2017-02-12 DIAGNOSIS — I5022 Chronic systolic (congestive) heart failure: Secondary | ICD-10-CM | POA: Diagnosis not present

## 2017-02-12 DIAGNOSIS — I2510 Atherosclerotic heart disease of native coronary artery without angina pectoris: Secondary | ICD-10-CM | POA: Diagnosis not present

## 2017-02-12 DIAGNOSIS — I110 Hypertensive heart disease with heart failure: Secondary | ICD-10-CM | POA: Diagnosis not present

## 2017-02-12 DIAGNOSIS — I480 Paroxysmal atrial fibrillation: Secondary | ICD-10-CM | POA: Diagnosis not present

## 2017-02-12 DIAGNOSIS — Z452 Encounter for adjustment and management of vascular access device: Secondary | ICD-10-CM | POA: Diagnosis not present

## 2017-02-12 DIAGNOSIS — F0390 Unspecified dementia without behavioral disturbance: Secondary | ICD-10-CM | POA: Diagnosis not present

## 2017-02-15 DIAGNOSIS — I252 Old myocardial infarction: Secondary | ICD-10-CM | POA: Diagnosis not present

## 2017-02-15 DIAGNOSIS — Z7901 Long term (current) use of anticoagulants: Secondary | ICD-10-CM | POA: Diagnosis not present

## 2017-02-15 DIAGNOSIS — I5022 Chronic systolic (congestive) heart failure: Secondary | ICD-10-CM | POA: Diagnosis not present

## 2017-02-15 DIAGNOSIS — D649 Anemia, unspecified: Secondary | ICD-10-CM | POA: Diagnosis not present

## 2017-02-15 DIAGNOSIS — I714 Abdominal aortic aneurysm, without rupture: Secondary | ICD-10-CM | POA: Diagnosis not present

## 2017-02-15 DIAGNOSIS — F0390 Unspecified dementia without behavioral disturbance: Secondary | ICD-10-CM | POA: Diagnosis not present

## 2017-02-15 DIAGNOSIS — I480 Paroxysmal atrial fibrillation: Secondary | ICD-10-CM | POA: Diagnosis not present

## 2017-02-15 DIAGNOSIS — I2510 Atherosclerotic heart disease of native coronary artery without angina pectoris: Secondary | ICD-10-CM | POA: Diagnosis not present

## 2017-02-15 DIAGNOSIS — Z79899 Other long term (current) drug therapy: Secondary | ICD-10-CM | POA: Diagnosis not present

## 2017-02-15 DIAGNOSIS — Z452 Encounter for adjustment and management of vascular access device: Secondary | ICD-10-CM | POA: Diagnosis not present

## 2017-02-15 DIAGNOSIS — I110 Hypertensive heart disease with heart failure: Secondary | ICD-10-CM | POA: Diagnosis not present

## 2017-02-18 DIAGNOSIS — I2510 Atherosclerotic heart disease of native coronary artery without angina pectoris: Secondary | ICD-10-CM | POA: Diagnosis not present

## 2017-02-18 DIAGNOSIS — F0390 Unspecified dementia without behavioral disturbance: Secondary | ICD-10-CM | POA: Diagnosis not present

## 2017-02-18 DIAGNOSIS — I480 Paroxysmal atrial fibrillation: Secondary | ICD-10-CM | POA: Diagnosis not present

## 2017-02-18 DIAGNOSIS — I5022 Chronic systolic (congestive) heart failure: Secondary | ICD-10-CM | POA: Diagnosis not present

## 2017-02-18 DIAGNOSIS — I110 Hypertensive heart disease with heart failure: Secondary | ICD-10-CM | POA: Diagnosis not present

## 2017-02-18 DIAGNOSIS — Z452 Encounter for adjustment and management of vascular access device: Secondary | ICD-10-CM | POA: Diagnosis not present

## 2017-02-21 ENCOUNTER — Other Ambulatory Visit (HOSPITAL_COMMUNITY): Payer: Self-pay | Admitting: Internal Medicine

## 2017-02-24 ENCOUNTER — Telehealth (HOSPITAL_COMMUNITY): Payer: Self-pay | Admitting: *Deleted

## 2017-02-24 NOTE — Telephone Encounter (Signed)
Spoke with Mr.Catala and confirmed appt for 5/1 3:00 with Dr.Bensimhon.

## 2017-02-25 ENCOUNTER — Ambulatory Visit (HOSPITAL_COMMUNITY)
Admission: RE | Admit: 2017-02-25 | Discharge: 2017-02-25 | Disposition: A | Discharge status: Home / Self Care | Payer: Medicare Other | Source: Ambulatory Visit | Attending: Internal Medicine | Admitting: Internal Medicine

## 2017-02-25 ENCOUNTER — Encounter (HOSPITAL_COMMUNITY): Payer: Self-pay | Admitting: Internal Medicine

## 2017-02-25 VITALS — BP 137/79 | HR 76 | Wt 94.5 lb

## 2017-02-25 DIAGNOSIS — D649 Anemia, unspecified: Secondary | ICD-10-CM | POA: Insufficient documentation

## 2017-02-25 DIAGNOSIS — I110 Hypertensive heart disease with heart failure: Secondary | ICD-10-CM | POA: Diagnosis not present

## 2017-02-25 DIAGNOSIS — Z8249 Family history of ischemic heart disease and other diseases of the circulatory system: Secondary | ICD-10-CM | POA: Diagnosis not present

## 2017-02-25 DIAGNOSIS — I5022 Chronic systolic (congestive) heart failure: Secondary | ICD-10-CM | POA: Diagnosis not present

## 2017-02-25 DIAGNOSIS — I714 Abdominal aortic aneurysm, without rupture: Secondary | ICD-10-CM | POA: Diagnosis not present

## 2017-02-25 DIAGNOSIS — I472 Ventricular tachycardia: Secondary | ICD-10-CM | POA: Diagnosis not present

## 2017-02-25 DIAGNOSIS — I2510 Atherosclerotic heart disease of native coronary artery without angina pectoris: Secondary | ICD-10-CM | POA: Diagnosis not present

## 2017-02-25 DIAGNOSIS — Z79899 Other long term (current) drug therapy: Secondary | ICD-10-CM | POA: Insufficient documentation

## 2017-02-25 DIAGNOSIS — Z7901 Long term (current) use of anticoagulants: Secondary | ICD-10-CM | POA: Insufficient documentation

## 2017-02-25 DIAGNOSIS — I2583 Coronary atherosclerosis due to lipid rich plaque: Secondary | ICD-10-CM

## 2017-02-25 DIAGNOSIS — R0603 Acute respiratory distress: Secondary | ICD-10-CM | POA: Insufficient documentation

## 2017-02-25 DIAGNOSIS — E875 Hyperkalemia: Secondary | ICD-10-CM | POA: Insufficient documentation

## 2017-02-25 DIAGNOSIS — I429 Cardiomyopathy, unspecified: Secondary | ICD-10-CM | POA: Diagnosis not present

## 2017-02-25 DIAGNOSIS — E785 Hyperlipidemia, unspecified: Secondary | ICD-10-CM | POA: Insufficient documentation

## 2017-02-25 DIAGNOSIS — F0390 Unspecified dementia without behavioral disturbance: Secondary | ICD-10-CM | POA: Insufficient documentation

## 2017-02-25 DIAGNOSIS — R55 Syncope and collapse: Secondary | ICD-10-CM | POA: Diagnosis not present

## 2017-02-25 DIAGNOSIS — I4891 Unspecified atrial fibrillation: Secondary | ICD-10-CM | POA: Insufficient documentation

## 2017-02-25 DIAGNOSIS — R079 Chest pain, unspecified: Secondary | ICD-10-CM | POA: Diagnosis not present

## 2017-02-25 DIAGNOSIS — I739 Peripheral vascular disease, unspecified: Secondary | ICD-10-CM | POA: Diagnosis not present

## 2017-02-25 DIAGNOSIS — I480 Paroxysmal atrial fibrillation: Secondary | ICD-10-CM

## 2017-02-25 DIAGNOSIS — Z452 Encounter for adjustment and management of vascular access device: Secondary | ICD-10-CM | POA: Diagnosis not present
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
## 2017-02-25 NOTE — Addendum Note (Signed)
Encounter addended by: Louann Liv, LCSW on: 02/25/2017  4:04 PM<BR>    Actions taken: Sign clinical note

## 2017-03-04 ENCOUNTER — Encounter (HOSPITAL_COMMUNITY): Payer: Self-pay | Admitting: Emergency Medicine

## 2017-03-04 ENCOUNTER — Emergency Department (HOSPITAL_COMMUNITY): Payer: Medicare Other

## 2017-03-04 ENCOUNTER — Inpatient Hospital Stay (HOSPITAL_COMMUNITY)
Admission: EM | Admit: 2017-03-04 | Discharge: 2017-03-08 | DRG: 470 | Disposition: A | Discharge status: Skilled Nursing Facility | Payer: Medicare Other | Attending: Family Medicine | Admitting: Family Medicine

## 2017-03-04 DIAGNOSIS — S199XXA Unspecified injury of neck, initial encounter: Secondary | ICD-10-CM | POA: Diagnosis not present

## 2017-03-04 DIAGNOSIS — I714 Abdominal aortic aneurysm, without rupture: Secondary | ICD-10-CM | POA: Diagnosis present

## 2017-03-04 DIAGNOSIS — E785 Hyperlipidemia, unspecified: Secondary | ICD-10-CM | POA: Diagnosis present

## 2017-03-04 DIAGNOSIS — I5084 End stage heart failure: Secondary | ICD-10-CM | POA: Diagnosis present

## 2017-03-04 DIAGNOSIS — S79911A Unspecified injury of right hip, initial encounter: Secondary | ICD-10-CM | POA: Diagnosis not present

## 2017-03-04 DIAGNOSIS — S32302D Unspecified fracture of left ilium, subsequent encounter for fracture with routine healing: Secondary | ICD-10-CM | POA: Diagnosis not present

## 2017-03-04 DIAGNOSIS — M6281 Muscle weakness (generalized): Secondary | ICD-10-CM | POA: Diagnosis not present

## 2017-03-04 DIAGNOSIS — Z9181 History of falling: Secondary | ICD-10-CM | POA: Diagnosis not present

## 2017-03-04 DIAGNOSIS — I2510 Atherosclerotic heart disease of native coronary artery without angina pectoris: Secondary | ICD-10-CM | POA: Diagnosis not present

## 2017-03-04 DIAGNOSIS — E876 Hypokalemia: Secondary | ICD-10-CM | POA: Diagnosis not present

## 2017-03-04 DIAGNOSIS — S72002A Fracture of unspecified part of neck of left femur, initial encounter for closed fracture: Secondary | ICD-10-CM | POA: Diagnosis not present

## 2017-03-04 DIAGNOSIS — I420 Dilated cardiomyopathy: Secondary | ICD-10-CM | POA: Diagnosis not present

## 2017-03-04 DIAGNOSIS — S72012A Unspecified intracapsular fracture of left femur, initial encounter for closed fracture: Principal | ICD-10-CM | POA: Diagnosis present

## 2017-03-04 DIAGNOSIS — Z96642 Presence of left artificial hip joint: Secondary | ICD-10-CM | POA: Diagnosis not present

## 2017-03-04 DIAGNOSIS — Z681 Body mass index (BMI) 19 or less, adult: Secondary | ICD-10-CM

## 2017-03-04 DIAGNOSIS — I5022 Chronic systolic (congestive) heart failure: Secondary | ICD-10-CM | POA: Diagnosis not present

## 2017-03-04 DIAGNOSIS — T148XXA Other injury of unspecified body region, initial encounter: Secondary | ICD-10-CM

## 2017-03-04 DIAGNOSIS — M25552 Pain in left hip: Secondary | ICD-10-CM | POA: Diagnosis not present

## 2017-03-04 DIAGNOSIS — S72009A Fracture of unspecified part of neck of unspecified femur, initial encounter for closed fracture: Secondary | ICD-10-CM | POA: Diagnosis not present

## 2017-03-04 DIAGNOSIS — R0902 Hypoxemia: Secondary | ICD-10-CM | POA: Diagnosis present

## 2017-03-04 DIAGNOSIS — I481 Persistent atrial fibrillation: Secondary | ICD-10-CM | POA: Diagnosis not present

## 2017-03-04 DIAGNOSIS — S299XXA Unspecified injury of thorax, initial encounter: Secondary | ICD-10-CM | POA: Diagnosis not present

## 2017-03-04 DIAGNOSIS — I4891 Unspecified atrial fibrillation: Secondary | ICD-10-CM | POA: Diagnosis not present

## 2017-03-04 DIAGNOSIS — Z7901 Long term (current) use of anticoagulants: Secondary | ICD-10-CM

## 2017-03-04 DIAGNOSIS — S70922A Unspecified superficial injury of left thigh, initial encounter: Secondary | ICD-10-CM | POA: Diagnosis not present

## 2017-03-04 DIAGNOSIS — F0390 Unspecified dementia without behavioral disturbance: Secondary | ICD-10-CM | POA: Diagnosis not present

## 2017-03-04 DIAGNOSIS — I739 Peripheral vascular disease, unspecified: Secondary | ICD-10-CM | POA: Diagnosis present

## 2017-03-04 DIAGNOSIS — D62 Acute posthemorrhagic anemia: Secondary | ICD-10-CM | POA: Diagnosis not present

## 2017-03-04 DIAGNOSIS — I4819 Other persistent atrial fibrillation: Secondary | ICD-10-CM | POA: Diagnosis present

## 2017-03-04 DIAGNOSIS — I10 Essential (primary) hypertension: Secondary | ICD-10-CM | POA: Diagnosis not present

## 2017-03-04 DIAGNOSIS — Z79899 Other long term (current) drug therapy: Secondary | ICD-10-CM | POA: Diagnosis not present

## 2017-03-04 DIAGNOSIS — I110 Hypertensive heart disease with heart failure: Secondary | ICD-10-CM | POA: Diagnosis present

## 2017-03-04 DIAGNOSIS — D72829 Elevated white blood cell count, unspecified: Secondary | ICD-10-CM | POA: Diagnosis present

## 2017-03-04 DIAGNOSIS — R262 Difficulty in walking, not elsewhere classified: Secondary | ICD-10-CM | POA: Diagnosis not present

## 2017-03-04 DIAGNOSIS — Z471 Aftercare following joint replacement surgery: Secondary | ICD-10-CM | POA: Diagnosis not present

## 2017-03-04 DIAGNOSIS — I255 Ischemic cardiomyopathy: Secondary | ICD-10-CM | POA: Diagnosis not present

## 2017-03-04 DIAGNOSIS — R9431 Abnormal electrocardiogram [ECG] [EKG]: Secondary | ICD-10-CM | POA: Diagnosis not present

## 2017-03-04 DIAGNOSIS — J9690 Respiratory failure, unspecified, unspecified whether with hypoxia or hypercapnia: Secondary | ICD-10-CM | POA: Diagnosis not present

## 2017-03-04 DIAGNOSIS — W010XXA Fall on same level from slipping, tripping and stumbling without subsequent striking against object, initial encounter: Secondary | ICD-10-CM | POA: Diagnosis present

## 2017-03-04 DIAGNOSIS — I480 Paroxysmal atrial fibrillation: Secondary | ICD-10-CM | POA: Diagnosis not present

## 2017-03-04 DIAGNOSIS — Z955 Presence of coronary angioplasty implant and graft: Secondary | ICD-10-CM | POA: Diagnosis not present

## 2017-03-04 DIAGNOSIS — Z96649 Presence of unspecified artificial hip joint: Secondary | ICD-10-CM

## 2017-03-04 DIAGNOSIS — S0990XA Unspecified injury of head, initial encounter: Secondary | ICD-10-CM | POA: Diagnosis not present

## 2017-03-04 DIAGNOSIS — J9601 Acute respiratory failure with hypoxia: Secondary | ICD-10-CM | POA: Diagnosis not present

## 2017-03-04 DIAGNOSIS — Z452 Encounter for adjustment and management of vascular access device: Secondary | ICD-10-CM | POA: Diagnosis not present

## 2017-03-04 DIAGNOSIS — R41841 Cognitive communication deficit: Secondary | ICD-10-CM | POA: Diagnosis not present

## 2017-03-04 DIAGNOSIS — E440 Moderate protein-calorie malnutrition: Secondary | ICD-10-CM | POA: Diagnosis present

## 2017-03-04 DIAGNOSIS — Z01810 Encounter for preprocedural cardiovascular examination: Secondary | ICD-10-CM | POA: Diagnosis not present

## 2017-03-04 DIAGNOSIS — S72042A Displaced fracture of base of neck of left femur, initial encounter for closed fracture: Secondary | ICD-10-CM | POA: Diagnosis not present

## 2017-03-04 LAB — CBC
HCT: 38.3 % (ref 36.0–46.0)
Hemoglobin: 12.4 g/dL (ref 12.0–15.0)
MCH: 28.6 pg (ref 26.0–34.0)
MCHC: 32.4 g/dL (ref 30.0–36.0)
MCV: 88.5 fL (ref 78.0–100.0)
Platelets: 244 10*3/uL (ref 150–400)
RBC: 4.33 MIL/uL (ref 3.87–5.11)
RDW: 14.4 % (ref 11.5–15.5)
WBC: 10.8 10*3/uL — ABNORMAL HIGH (ref 4.0–10.5)

## 2017-03-04 LAB — COMPREHENSIVE METABOLIC PANEL
ALT: 18 U/L (ref 14–54)
AST: 26 U/L (ref 15–41)
Albumin: 4.2 g/dL (ref 3.5–5.0)
Alkaline Phosphatase: 89 U/L (ref 38–126)
Anion gap: 13 (ref 5–15)
BUN: 20 mg/dL (ref 6–20)
CO2: 27 mmol/L (ref 22–32)
Calcium: 9.4 mg/dL (ref 8.9–10.3)
Chloride: 100 mmol/L — ABNORMAL LOW (ref 101–111)
Creatinine, Ser: 0.99 mg/dL (ref 0.44–1.00)
GFR calc Af Amer: 56 mL/min — ABNORMAL LOW (ref >60)
GFR calc non Af Amer: 48 mL/min — ABNORMAL LOW (ref >60)
Glucose, Bld: 132 mg/dL — ABNORMAL HIGH (ref 65–99)
Potassium: 3.6 mmol/L (ref 3.5–5.1)
Sodium: 140 mmol/L (ref 135–145)
Total Bilirubin: 0.7 mg/dL (ref 0.3–1.2)
Total Protein: 7.4 g/dL (ref 6.5–8.1)

## 2017-03-04 LAB — CBC WITH DIFFERENTIAL/PLATELET
Basophils Absolute: 0 10*3/uL (ref 0.0–0.1)
Basophils Relative: 0 %
Eosinophils Absolute: 0 10*3/uL (ref 0.0–0.7)
Eosinophils Relative: 0 %
HCT: 39.8 % (ref 36.0–46.0)
Hemoglobin: 13.2 g/dL (ref 12.0–15.0)
Lymphocytes Relative: 8 %
Lymphs Abs: 1.5 10*3/uL (ref 0.7–4.0)
MCH: 29.5 pg (ref 26.0–34.0)
MCHC: 33.2 g/dL (ref 30.0–36.0)
MCV: 88.8 fL (ref 78.0–100.0)
Monocytes Absolute: 0.4 10*3/uL (ref 0.1–1.0)
Monocytes Relative: 2 %
Neutro Abs: 16.4 10*3/uL — ABNORMAL HIGH (ref 1.7–7.7)
Neutrophils Relative %: 90 %
Platelets: 272 10*3/uL (ref 150–400)
RBC: 4.48 MIL/uL (ref 3.87–5.11)
RDW: 14.5 % (ref 11.5–15.5)
WBC: 18.3 10*3/uL — ABNORMAL HIGH (ref 4.0–10.5)

## 2017-03-04 LAB — BRAIN NATRIURETIC PEPTIDE: B Natriuretic Peptide: 69.6 pg/mL (ref 0.0–100.0)

## 2017-03-04 LAB — I-STAT TROPONIN, ED: Troponin i, poc: 0.09 ng/mL (ref 0.00–0.08)

## 2017-03-04 LAB — CREATININE, SERUM
Creatinine, Ser: 1 mg/dL (ref 0.44–1.00)
GFR calc Af Amer: 55 mL/min — ABNORMAL LOW (ref >60)
GFR calc non Af Amer: 48 mL/min — ABNORMAL LOW (ref >60)

## 2017-03-04 LAB — SURGICAL PCR SCREEN
MRSA, PCR: NEGATIVE
Staphylococcus aureus: POSITIVE — AB

## 2017-03-04 LAB — TROPONIN I: Troponin I: 0.23 ng/mL (ref <0.03)

## 2017-03-04 LAB — PROTIME-INR
INR: 1.1
Prothrombin Time: 14.2 seconds (ref 11.4–15.2)

## 2017-03-04 LAB — TSH: TSH: 0.394 u[IU]/mL (ref 0.350–4.500)

## 2017-03-04 MED ORDER — BUSPIRONE HCL 5 MG PO TABS
5.0000 mg | ORAL_TABLET | Freq: Every day | ORAL | Status: DC | PRN
  Administered 2017-03-06: 5 mg via ORAL
  Filled 2017-03-04: qty 1

## 2017-03-04 MED ORDER — AMIODARONE HCL 100 MG PO TABS
100.0000 mg | ORAL_TABLET | Freq: Every day | ORAL | Status: DC
  Administered 2017-03-04 – 2017-03-08 (×5): 100 mg via ORAL
  Filled 2017-03-04 (×5): qty 1

## 2017-03-04 MED ORDER — FENTANYL CITRATE (PF) 100 MCG/2ML IJ SOLN
50.0000 ug | Freq: Once | INTRAMUSCULAR | Status: AC
  Administered 2017-03-04: 50 ug via INTRAVENOUS
  Filled 2017-03-04: qty 2

## 2017-03-04 MED ORDER — TORSEMIDE 20 MG PO TABS
40.0000 mg | ORAL_TABLET | Freq: Two times a day (BID) | ORAL | Status: DC
  Administered 2017-03-04 – 2017-03-08 (×8): 40 mg via ORAL
  Filled 2017-03-04 (×8): qty 2

## 2017-03-04 MED ORDER — MORPHINE SULFATE (PF) 4 MG/ML IV SOLN: 0.5000 mg | INTRAVENOUS | Status: DC | PRN

## 2017-03-04 MED ORDER — MENTHOL 3 MG MT LOZG
1.0000 | LOZENGE | OROMUCOSAL | Status: DC | PRN
  Administered 2017-03-05: 3 mg via ORAL
  Filled 2017-03-04: qty 9

## 2017-03-04 MED ORDER — SODIUM CHLORIDE 0.9 % IV SOLN: 0.2500 ug/kg/min | INTRAVENOUS | Status: DC

## 2017-03-04 MED ORDER — SODIUM CHLORIDE 0.9 % IV SOLN: 0.1250 ug/kg/min | INTRAVENOUS | Status: DC

## 2017-03-04 MED ORDER — LISINOPRIL 2.5 MG PO TABS
2.5000 mg | ORAL_TABLET | Freq: Every day | ORAL | Status: DC
  Administered 2017-03-05 – 2017-03-08 (×4): 2.5 mg via ORAL
  Filled 2017-03-04 (×4): qty 1

## 2017-03-04 MED ORDER — SENNOSIDES-DOCUSATE SODIUM 8.6-50 MG PO TABS: 1.0000 | ORAL_TABLET | Freq: Every evening | ORAL | Status: DC | PRN

## 2017-03-04 MED ORDER — ADULT MULTIVITAMIN W/MINERALS CH
1.0000 | ORAL_TABLET | Freq: Every day | ORAL | Status: DC
  Administered 2017-03-05 – 2017-03-08 (×4): 1 via ORAL
  Filled 2017-03-04 (×5): qty 1

## 2017-03-04 MED ORDER — SODIUM CHLORIDE 0.9 % IV SOLN
INTRAVENOUS | Status: AC
  Administered 2017-03-04: 21:00:00 via INTRAVENOUS

## 2017-03-04 MED ORDER — HEPARIN SODIUM (PORCINE) 5000 UNIT/ML IJ SOLN
5000.0000 [IU] | Freq: Three times a day (TID) | INTRAMUSCULAR | Status: DC
  Administered 2017-03-04: 5000 [IU] via SUBCUTANEOUS
  Filled 2017-03-04 (×2): qty 1

## 2017-03-04 MED ORDER — HYDROCODONE-ACETAMINOPHEN 5-325 MG PO TABS
1.0000 | ORAL_TABLET | Freq: Four times a day (QID) | ORAL | Status: DC | PRN
  Administered 2017-03-04 – 2017-03-07 (×4): 1 via ORAL
  Filled 2017-03-04 (×4): qty 1

## 2017-03-04 MED ORDER — MILRINONE LACTATE IN DEXTROSE 20-5 MG/100ML-% IV SOLN
0.1250 ug/kg/min | INTRAVENOUS | Status: DC
  Administered 2017-03-04 – 2017-03-06 (×2): 0.125 ug/kg/min via INTRAVENOUS
  Filled 2017-03-04 (×2): qty 100
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
## 2017-03-04 NOTE — ED Notes (Signed)
Pt to be moved to Rm 24 when available.

## 2017-03-04 NOTE — Progress Notes (Signed)
Pt's arrived to the unit via carelink on stretcher. Pt alert and verbally responsive; pt oriented to the unit and room; fall/safety precaution/prevention education completed with pt. Pt placed on telemetry and verified with CCMD; NT called to second verify. Pt's right upper arm PICC intact with home medication infusing; pt's skin clean, dry and intact with no open wounds or pressure ulcers noted except for gauze placed in between right toes. Ice applied to left hip; reported off oncoming RN. Will continue to closely monitor pt. Delia Heady RN

## 2017-03-04 NOTE — ED Notes (Signed)
Bed: WA24 Expected date:  Expected time:  Means of arrival:  Comments: Hold hall C

## 2017-03-04 NOTE — ED Notes (Signed)
Bed: [facility] Expected date:  Expected time:  Means of arrival:  Comments: EMS-fall 

## 2017-03-04 NOTE — Progress Notes (Signed)
Pt and spouse argumentive with each other when I asked the questions on the nursing admission history. They give conflicting information to the questions asked. Lucius Conn BSN, RN-BC Admissions RN 03/04/2017 4:01 PM

## 2017-03-04 NOTE — Progress Notes (Signed)
I notified Dr. Hilbert Bible of the patient's troponin of 0.23. No new orders were given. Patient is resting in bed. Patient has no complaints. Vital signs are stable. Patient is running NSR with PACs in 80s per tele tech.

## 2017-03-04 NOTE — H&P (Signed)
History and Physical    [name]:[id number] DOB: June 07, 1924 DOA: 03/04/2017  PCP: [name], MD   I have briefly reviewed patients previous medical reports in [facility].  Patient coming from: Home  Chief Complaint: Mechanical fall and left hip pain  HPI: Jacqueline Reilly is a 83 year old female with a past medical history significant of atrial fibrillation (chronically on liquids), ischemic cardiomyopathy with chronic systolic heart failure (ejection fraction 30-35% and is chronically on milrinone), hypertension and hyperlipidemia; who presented to the hospital secondary to left hip. Patient reports experiencing a mechanical fall this morning and landed on her left side. Patient denies any chest pain, shortness of breath, fever, chills, abdominal pain, dysuria, nausea, vomiting, diarrhea, hematuria, hematochezia, melena or any other complaints. She endorses no symptoms prior or predisposing to her fall.   ED Course: Patient with x-rays and imaging studies that demonstrated no cervical spine fracture and no intracranial hemorrhage. There is acute left hip fracture and her blood work demonstrated leukocytosis. Orthopedic service consulted and [facility] hospital[facility] called to admit as per history fracture protocol.  Review of Systems:  All other systems reviewed and apart from HPI, are negative.  Past Medical History:  Diagnosis Date  . AAA (abdominal aortic aneurysm) ([place])   . Atrial fibrillation ([place])   . CHF (congestive heart failure) ([place])   . Chronic systolic heart failure ([place])    a. ECHO (04/2014) EF 20-25%, diff HK, mild MR  . Coronary artery disease 2007   moderate ASCAD of the left system s/p PCI of the D2 and PCI of the left circ 03/2009  . Hyperlipidemia   . Hypertension   . PVD (peripheral vascular disease) ([place])   . Renal artery stenosis ([place])   . Ventricular dysfunction    left; ischemic    Past Surgical History:  Procedure Laterality Date  . ANGIOPLASTY     . CORONARY STENT PLACEMENT    . IR GENERIC HISTORICAL  09/25/2016   IR FLUORO GUIDE CV LINE RIGHT 09/25/2016 Ardis Rowan, PA-C [facility]INTER[facility]  . retinal cryopexy     right eye (for retinal detachment)    Social History  reports that she has never smoked. She has never used smokeless tobacco. She reports that she drinks alcohol. She reports that she does not use drugs.  Allergies  Allergen Reactions  . Codeine Nausea Only  . Garlic Nausea Only    Family History  Problem Relation Age of Onset  . Heart disease Mother   . Heart disease Father   . Heart disease Brother     x 3     Prior to Admission medications   Medication Sig Start Date End Date Taking? Authorizing Provider  apixaban (ELIQUIS) 2.5 MG TABS tablet Take 1 tablet (2.5 mg total) by mouth 2 (two) times daily. 11/22/16  Yes Bensimhon, Shaune Pascal, MD  busPIRone (BUSPAR) 5 MG tablet Take 1 tablet (5 mg total) by mouth daily as needed. 02/03/17  Yes Bensimhon, Shaune Pascal, MD  lisinopril (PRINIVIL,ZESTRIL) 2.5 MG tablet TAKE ONE TABLET BY MOUTH ONE TIME DAILY 11/21/16  Yes Bensimhon, Shaune Pascal, MD  Multiple Vitamin (MULTIVITAMIN) tablet Take 1 tablet by mouth daily. 07/26/14  Yes Larey Dresser, MD  Omega-3 Fatty Acids (FISH OIL) 1000 MG CAPS Take 1 capsule by mouth daily.   Yes [provider]  PACERONE 200 MG tablet TAKE 0.5 TABLETS (100 MG TOTAL) BY MOUTH DAILY. 01/09/17  Yes Bensimhon, Shaune Pascal, MD  sodium  chloride 0.9 % SOLN with milrinone 1 MG/ML SOLN 200 mcg/mL Inject into the vein continuous. 0.125 mcg/kg/min 08/28/16  Yes [provider]  torsemide (DEMADEX) 20 MG tablet TAKE TWO TABLETS BY MOUTH TWICE DAILY 01/31/17  Yes Bensimhon, Shaune Pascal, MD    Physical Exam: Vitals:   03/04/17 1630 03/04/17 1700 03/04/17 1730 03/04/17 1849  BP: 120/82 122/89 117/76 115/66  Pulse: 82 87 84 80  Resp: 19 (!) 22 17 18   Temp:    98.4 F (36.9 C)  TempSrc:    Oral  SpO2: 95% 95% 94% 97%   Constitutional: In  no major distress (as long as she is not moving); denies chest pain and shortness of breath. Patient is afebrile Eyes: PERTLA, lids and conjunctivae normal, no icterus, no nystagmus ENMT: Mucous membranes Slightly dried on exam. Posterior pharynx clear of any exudate or lesions. No thrush Neck: supple, no masses, no thyromegaly Respiratory: Good air movement bilaterally, no wheezing, no crackles, no using accessory muscles. Cardiovascular: Rate control, positive murmur on auscultation; no JVD. No carotid bruits.  Abdomen: No distension, no tenderness, no masses palpated. No hepatosplenomegaly. Bowel sounds normal.  Musculoskeletal: no clubbing / cyanosis. No edema. Skin: no rashes, No Petechiae and open ulcers. Right upper extremity with PICC line in place. Neurologic: CN 2-12 grossly intact. Sensation intact, DTR normal. Strength Normal in All limbs; except her left leg where she is complaining of pain and decreased mobility due to acute fracture. Psychiatric: Normal judgment and insight. Alert and oriented x 3. Normal mood.    Labs on Admission: I have personally reviewed following labs and imaging studies  CBC:  Recent Labs Lab 03/04/17 1235  WBC 18.3*  NEUTROABS 16.4*  HGB 13.2  HCT 39.8  MCV 88.8  PLT 017   Basic Metabolic Panel:  Recent Labs Lab 03/04/17 1235  NA 140  K 3.6  CL 100*  CO2 27  GLUCOSE 132*  BUN 20  CREATININE 0.99  CALCIUM 9.4   Liver Function Tests:  Recent Labs Lab 03/04/17 1235  AST 26  ALT 18  ALKPHOS 89  BILITOT 0.7  PROT 7.4  ALBUMIN 4.2   Coagulation Profile:  Recent Labs Lab 03/04/17 1235  INR 1.10   Urine analysis:    Component Value Date/Time   COLORURINE YELLOW 01/08/2016 1335   APPEARANCEUR CLEAR 01/08/2016 1335   LABSPEC 1.013 01/08/2016 1335   PHURINE 7.5 01/08/2016 1335   GLUCOSEU NEGATIVE 01/08/2016 1335   HGBUR NEGATIVE 01/08/2016 1335   BILIRUBINUR NEGATIVE 01/08/2016 1335   KETONESUR NEGATIVE 01/08/2016 1335    PROTEINUR NEGATIVE 01/08/2016 1335   UROBILINOGEN 0.2 09/01/2015 1254   NITRITE NEGATIVE 01/08/2016 1335   LEUKOCYTESUR NEGATIVE 01/08/2016 1335     Radiological Exams on Admission: Dg Chest 2 View  Result Date: 03/04/2017 CLINICAL DATA:  83 year old female fell this morning. Left hip pain. Initial encounter. EXAM: CHEST  2 VIEW COMPARISON:  01/08/2016 chest x-ray. FINDINGS: Right PICC line tip mid to distal superior vena cava level. Cardiomegaly. Tortuous aorta. Central pulmonary vascular prominence without pulmonary edema or segmental consolidation. No obvious rib fracture or pneumothorax. Bilateral shoulder joint degenerative changes. Probable calcified fibroadenoma left breast. IMPRESSION: Cardiomegaly. Tortuous aorta. Central pulmonary vascular prominence without pulmonary edema. Bilateral shoulder joint degenerative changes. Electronically Signed   By: Genia Del M.D.   On: 03/04/2017 13:31   Ct Head Wo Contrast  Result Date: 03/04/2017 CLINICAL DATA:  83 year old female post fall (tripping over of electric cord). Denies head or  neck discomfort. Initial encounter. EXAM: CT HEAD WITHOUT CONTRAST CT CERVICAL SPINE WITHOUT CONTRAST TECHNIQUE: Multidetector CT imaging of the head and cervical spine was performed following the standard protocol without intravenous contrast. Multiplanar CT image reconstructions of the cervical spine were also generated. COMPARISON:  09/07/2015 head CT. 06/17/2014 head CT and cervical spine CT. FINDINGS: CT HEAD FINDINGS No intracranial hemorrhage or CT evidence of large acute infarct. Chronic microvascular changes. Atrophy without hydrocephalus. No intracranial mass lesion noted on this unenhanced exam. Vascular: Vascular calcifications Skull: No skull fracture Sinuses/Orbits: Banding right globe without acute abnormality. Visualized paranasal sinuses are clear. Other: Negative CT CERVICAL SPINE FINDINGS Alignment: Within normal limits. Skull base and vertebrae: No  cervical spine fracture. Soft tissues and spinal canal: No abnormal prevertebral soft tissue swelling. Disc levels: Spur with mild narrowing ventral thecal sac C4-5 thru C6-7. Upper chest: Negative for worrisome abnormality. Other: Negative for worrisome abnormality. IMPRESSION: No skull fracture or intracranial hemorrhage. No cervical spine fracture or abnormal prevertebral soft tissue swelling. Electronically Signed   By: Genia Del M.D.   On: 03/04/2017 14:26   Ct Cervical Spine Wo Contrast  Result Date: 03/04/2017 CLINICAL DATA:  83 year old female post fall (tripping over of electric cord). Denies head or neck discomfort. Initial encounter. EXAM: CT HEAD WITHOUT CONTRAST CT CERVICAL SPINE WITHOUT CONTRAST TECHNIQUE: Multidetector CT imaging of the head and cervical spine was performed following the standard protocol without intravenous contrast. Multiplanar CT image reconstructions of the cervical spine were also generated. COMPARISON:  09/07/2015 head CT. 06/17/2014 head CT and cervical spine CT. FINDINGS: CT HEAD FINDINGS No intracranial hemorrhage or CT evidence of large acute infarct. Chronic microvascular changes. Atrophy without hydrocephalus. No intracranial mass lesion noted on this unenhanced exam. Vascular: Vascular calcifications Skull: No skull fracture Sinuses/Orbits: Banding right globe without acute abnormality. Visualized paranasal sinuses are clear. Other: Negative CT CERVICAL SPINE FINDINGS Alignment: Within normal limits. Skull base and vertebrae: No cervical spine fracture. Soft tissues and spinal canal: No abnormal prevertebral soft tissue swelling. Disc levels: Spur with mild narrowing ventral thecal sac C4-5 thru C6-7. Upper chest: Negative for worrisome abnormality. Other: Negative for worrisome abnormality. IMPRESSION: No skull fracture or intracranial hemorrhage. No cervical spine fracture or abnormal prevertebral soft tissue swelling. Electronically Signed   By: Genia Del  M.D.   On: 03/04/2017 14:26   Dg Hip Unilat With Pelvis 2-3 Views Left  Result Date: 03/04/2017 CLINICAL DATA:  Fall, acute severe left hip pain EXAM: DG HIP (WITH OR WITHOUT PELVIS) 2-3V LEFT COMPARISON:  None available FINDINGS: There is an acute displaced and angulated left hip subcapital femoral neck fracture. Bones are osteopenic. Bony pelvis appears intact. Degenerative changes of the lower lumbar spine, SI joints, and both hips. Peripheral atherosclerosis evident. Normal bowel gas pattern. IMPRESSION: Acute left hip subcapital femoral neck fracture. Atherosclerosis Electronically Signed   By: Jerilynn Mages.  Shick M.D.   On: 03/04/2017 13:14   Dg Femur Min 2 Views Left  Result Date: 03/04/2017 CLINICAL DATA:  Status post fall.  Left hip pain. EXAM: LEFT FEMUR 2 VIEWS COMPARISON:  None. FINDINGS: Left hip joint is excluded from the field of view. Visualized portions of the left femur demonstrate no fracture or dislocation. Mild tricompartmental osteoarthritis of the left knee. Generalized osteopenia. No significant joint effusion. No aggressive lytic or sclerotic osseous lesion. Peripheral vascular atherosclerotic disease. No soft tissue abnormality. IMPRESSION: Left hip joint is excluded from the field of view and assessed on left hip x-rays performed same  day. Visualized left femur demonstrates no acute fracture or dislocation. Electronically Signed   By: Kathreen Devoid   On: 03/04/2017 13:26    EKG:  Rate controlled, no acute ischemic abnormality; old left bundle branch block appreciated.  Assessment/Plan 1-Closed left hip fracture, initial encounter [facility]): Patient with mechanical fall after tripping with an electrical cord. -Presented to the emergency department with difficulty bearing weight and complaining of left hip pain. -Images studies has proven left hip fracture -After discussing with Dr. Erlinda Hong, he has requested patient to be transferred to [facility] for further evaluation and tentatively  surgical repair on 5/9 -Patient is a high risk for surgery (H, history of coronary artery disease, chronic systolic heart failure on milrinone and atrial fibrillation) -Cardiology has been consulted and will see the patient tomorrow morning -Will follow hip fracture protocol and use pain medication as needed   2-Ischemic dilated cardiomyopathy ([place]): With chronic systolic heart failure -Will continue treatment with milrinone and lisinopril -Patient had mild elevation of troponin on presentation but denies any chest pain or shortness of breath -Will trend troponins and repeat EKG -Cardiology has been consulted and will follow any further recommendations -BNP was not elevated (69.6)    3-Hypertension -Stable and well control -Will continue lisinopril and the use of Demadex -Heart healthy diet has been requested; nothing by mouth tomorrow morning  4-Chronic systolic heart failure ([facility]) -Last ejection fraction 30-35% on 2-D echo done February 2018 -Follow daily weights and strict intake and output -Continue milrinone drip and Demadex as per home regimen -Cardiology (heart failure team) has been made aware of patient admission and will see the patient along for further recommendations.  5-Persistent atrial fibrillation ([facility]) -Rate controlled -was on eliquis at home -will continue Amiodarone   6-Leukocytosis -Appears to be demargination from stress -very gentle IVF's will be provided -will follow trend -no signs of infection appreciated  7-Hypoxia -Patient denies shortness of breath and there is no crackles, wheezing or vascular congestion seen on x-ray. -Most likely associated with pain and discomfort at the moment of presentation to ED -Will provide oxygen supplementation as needed and wean her off as tolerated -IS use encouraged  Time: 55 minutes  DVT prophylaxis: Patient was on a liquid is prior to admission. Has been now placed on heparin.  Code Status: Partial code (no CPR  and no cardioversion). Family Communication: Husband at bedside  Disposition Plan: To be determined. Patient might ended requiring a skilled nursing facility for rehabilitation Consults called:  Orthopedic service (Dr. Erlinda Hong); cardiology (Dr. Haroldine Laws) Admission status:  Inpatient, LOS more than 2 midnights; telemetry bed   Barton Dubois MD [facility] Pager [phone]  If 7PM-7AM, please contact night-coverage [email or web address] Password [email or web address]  03/04/2017, 7:17 PM

## 2017-03-04 NOTE — ED Provider Notes (Signed)
[facility] DEPT Provider Note   CSN: [id number] Arrival date & time: 03/04/17  1118     History   Chief Complaint Chief Complaint  Patient presents with  . Fall    HPI Jacqueline Reilly is a 83 y.o. female.  The history is provided by the patient, the spouse and medical records. The history is limited by the condition of the patient.  Fall  This is a new problem. The current episode started 1 to 2 hours ago. The problem occurs constantly. The problem has been resolved. Associated symptoms include shortness of breath. Pertinent negatives include no chest pain, no abdominal pain and no headaches. The symptoms are aggravated by twisting and bending. Nothing relieves the symptoms. She has tried nothing for the symptoms. The treatment provided no relief.    Past Medical History:  Diagnosis Date  . AAA (abdominal aortic aneurysm) ([place])   . Atrial fibrillation ([place])   . CHF (congestive heart failure) ([place])   . Chronic systolic heart failure ([place])    a. ECHO (04/2014) EF 20-25%, diff HK, mild MR  . Coronary artery disease 2007   moderate ASCAD of the left system s/p PCI of the D2 and PCI of the left circ 03/2009  . Hyperlipidemia   . Hypertension   . PVD (peripheral vascular disease) ([place])   . Renal artery stenosis ([place])   . Ventricular dysfunction    left; ischemic    Patient Active Problem List   Diagnosis Date Noted  . Gram-negative bacteremia 09/01/2015  . Chronic anemia 09/01/2015  . Persistent atrial fibrillation ([place])   . Heart failure ([place]) 08/31/2015  . Chronic systolic CHF (congestive heart failure) ([place]) 06/21/2015  . Atrial fibrillation ([place]) 08/10/2014  . Physical deconditioning 05/26/2014  . Acute respiratory failure with hypoxia ([place]) 05/08/2014  . NSTEMI, initial episode of care ([place]) 05/07/2014  . Atrial fibrillation with rapid ventricular response ([place]) 05/07/2014  . NSTEMI (non-ST elevated myocardial infarction) ([place]) 05/07/2014  . Abnormal chest x-ray  04/24/2014  . CHF (congestive heart failure) ([place]) 04/23/2014  . Acute on chronic combined systolic and diastolic congestive heart failure ([place]) 04/23/2014  . Chronic systolic heart failure ([place]) 11/29/2013  . AAA (abdominal aortic aneurysm) ([place]) 10/07/2013  . SOB (shortness of breath) 09/30/2013  . Coronary artery disease   . Ischemic dilated cardiomyopathy ([place])   . Hypertension     Past Surgical History:  Procedure Laterality Date  . ANGIOPLASTY    . CORONARY STENT PLACEMENT    . IR GENERIC HISTORICAL  09/25/2016   IR FLUORO GUIDE CV LINE RIGHT 09/25/2016 Ardis Rowan, PA-C [facility]  . retinal cryopexy     right eye (for retinal detachment)    OB History    No data available       Home Medications    Prior to Admission medications   Medication Sig Start Date End Date Taking? Authorizing Provider  apixaban (ELIQUIS) 2.5 MG TABS tablet Take 1 tablet (2.5 mg total) by mouth 2 (two) times daily. 11/22/16  Yes Bensimhon, Shaune Pascal, MD  busPIRone (BUSPAR) 5 MG tablet Take 1 tablet (5 mg total) by mouth daily as needed. 02/03/17  Yes Bensimhon, Shaune Pascal, MD  lisinopril (PRINIVIL,ZESTRIL) 2.5 MG tablet TAKE ONE TABLET BY MOUTH ONE TIME DAILY 11/21/16  Yes Bensimhon, Shaune Pascal, MD  Multiple Vitamin (MULTIVITAMIN) tablet Take 1 tablet by mouth daily. 07/26/14  Yes Larey Dresser, MD  Omega-3 Fatty Acids (FISH OIL) 1000 MG CAPS Take 1  capsule by mouth daily.   Yes [provider]  PACERONE 200 MG tablet TAKE 0.5 TABLETS (100 MG TOTAL) BY MOUTH DAILY. 01/09/17  Yes Bensimhon, Shaune Pascal, MD  sodium chloride 0.9 % SOLN with milrinone 1 MG/ML SOLN 200 mcg/mL Inject into the vein continuous. 0.125 mcg/kg/min 08/28/16  Yes [provider]  torsemide (DEMADEX) 20 MG tablet TAKE TWO TABLETS BY MOUTH TWICE DAILY 01/31/17  Yes Bensimhon, Shaune Pascal, MD    Family History Family History  Problem Relation Age of Onset  . Heart disease Mother   . Heart disease Father   .  Heart disease Brother     x 3    Social History Social History  Substance Use Topics  . Smoking status: Never Smoker  . Smokeless tobacco: Never Used  . Alcohol use Yes     Comment: occasionally     Allergies   Codeine and Garlic   Review of Systems Review of Systems  Constitutional: Negative for chills, fatigue and fever.  HENT: Negative for congestion.   Respiratory: Positive for cough and shortness of breath. Negative for chest tightness, wheezing and stridor.   Cardiovascular: Negative for chest pain and palpitations.  Gastrointestinal: Negative for abdominal pain, constipation, diarrhea, nausea and vomiting.  Genitourinary: Negative for dysuria and flank pain.  Musculoskeletal: Negative for back pain, neck pain and neck stiffness.  Skin: Negative for rash and wound.  Neurological: Negative for seizures, light-headedness, numbness and headaches.  Psychiatric/Behavioral: Negative for agitation and confusion.  All other systems reviewed and are negative.    Physical Exam Updated Vital Signs BP 118/84 (BP Location: Left Arm)   Pulse 89   Temp 98.7 F (37.1 C) (Oral)   Resp 20   SpO2 91%   Physical Exam  Constitutional: She appears well-developed and well-nourished. No distress.  HENT:  Head: Normocephalic and atraumatic.  Mouth/Throat: Oropharynx is clear and moist. No oropharyngeal exudate.  Eyes: Conjunctivae and EOM are normal. Pupils are equal, round, and reactive to light.  Neck: Normal range of motion. Neck supple.  Cardiovascular: Normal rate, normal heart sounds and intact distal pulses.   No murmur heard. Pulmonary/Chest: Effort normal. No stridor. No respiratory distress. She has no wheezes. She has rales. She exhibits no tenderness.  Abdominal: Soft. Bowel sounds are normal. There is no tenderness.  Musculoskeletal: She exhibits tenderness. She exhibits no edema.       Left hip: She exhibits tenderness. She exhibits normal strength and no laceration.        Legs: Neurological: She is alert. She is not disoriented. No cranial nerve deficit or sensory deficit. She exhibits normal muscle tone. She displays no seizure activity. Coordination normal. GCS eye subscore is 4. GCS verbal subscore is 5. GCS motor subscore is 6.  Gait deferred 2/2 falls and likely hip fracture.  Skin: Skin is warm and dry. Capillary refill takes less than 2 seconds. No rash noted. No erythema.  Psychiatric: She has a normal mood and affect.  Nursing note and vitals reviewed.    ED Treatments / Results  Labs (all labs ordered are listed, but only abnormal results are displayed) Labs Reviewed  CBC WITH DIFFERENTIAL/PLATELET - Abnormal; Notable for the following:       Result Value   WBC 18.3 (*)    Neutro Abs 16.4 (*)    All other components within normal limits  COMPREHENSIVE METABOLIC PANEL - Abnormal; Notable for the following:    Chloride 100 (*)    Glucose,  Bld 132 (*)    GFR calc non Af Amer 48 (*)    GFR calc Af Amer 56 (*)    All other components within normal limits  TROPONIN I - Abnormal; Notable for the following:    Troponin I 0.23 (*)    All other components within normal limits  CBC - Abnormal; Notable for the following:    WBC 10.8 (*)    All other components within normal limits  CREATININE, SERUM - Abnormal; Notable for the following:    GFR calc non Af Amer 48 (*)    GFR calc Af Amer 55 (*)    All other components within normal limits  I-STAT TROPOININ, ED - Abnormal; Notable for the following:    Troponin i, poc 0.09 (*)    All other components within normal limits  SURGICAL PCR SCREEN  BRAIN NATRIURETIC PEPTIDE  PROTIME-INR  TSH  TROPONIN I  TROPONIN I  CBC  BASIC METABOLIC PANEL    EKG  EKG Interpretation  Date/Time:  Tuesday Mar 04 2017 12:01:42 EDT Ventricular Rate:  93 PR Interval:    QRS Duration: 126 QT Interval:  353 QTC Calculation: 439 R Axis:   -61 Text Interpretation:  Sinus rhythm Multiform ventricular  premature complexes Left bundle branch block When compared to prior, no significant changes seen.  No STEMI Confirmed by Sherry Ruffing MD, Alyas Creary [phone]) on 03/04/2017 12:27:15 PM       Radiology Dg Chest 2 View  Result Date: 03/04/2017 CLINICAL DATA:  83 year old female fell this morning. Left hip pain. Initial encounter. EXAM: CHEST  2 VIEW COMPARISON:  01/08/2016 chest x-ray. FINDINGS: Right PICC line tip mid to distal superior vena cava level. Cardiomegaly. Tortuous aorta. Central pulmonary vascular prominence without pulmonary edema or segmental consolidation. No obvious rib fracture or pneumothorax. Bilateral shoulder joint degenerative changes. Probable calcified fibroadenoma left breast. IMPRESSION: Cardiomegaly. Tortuous aorta. Central pulmonary vascular prominence without pulmonary edema. Bilateral shoulder joint degenerative changes. Electronically Signed   By: Genia Del M.D.   On: 03/04/2017 13:31   Ct Head Wo Contrast  Result Date: 03/04/2017 CLINICAL DATA:  83 year old female post fall (tripping over of electric cord). Denies head or neck discomfort. Initial encounter. EXAM: CT HEAD WITHOUT CONTRAST CT CERVICAL SPINE WITHOUT CONTRAST TECHNIQUE: Multidetector CT imaging of the head and cervical spine was performed following the standard protocol without intravenous contrast. Multiplanar CT image reconstructions of the cervical spine were also generated. COMPARISON:  09/07/2015 head CT. 06/17/2014 head CT and cervical spine CT. FINDINGS: CT HEAD FINDINGS No intracranial hemorrhage or CT evidence of large acute infarct. Chronic microvascular changes. Atrophy without hydrocephalus. No intracranial mass lesion noted on this unenhanced exam. Vascular: Vascular calcifications Skull: No skull fracture Sinuses/Orbits: Banding right globe without acute abnormality. Visualized paranasal sinuses are clear. Other: Negative CT CERVICAL SPINE FINDINGS Alignment: Within normal limits. Skull base and  vertebrae: No cervical spine fracture. Soft tissues and spinal canal: No abnormal prevertebral soft tissue swelling. Disc levels: Spur with mild narrowing ventral thecal sac C4-5 thru C6-7. Upper chest: Negative for worrisome abnormality. Other: Negative for worrisome abnormality. IMPRESSION: No skull fracture or intracranial hemorrhage. No cervical spine fracture or abnormal prevertebral soft tissue swelling. Electronically Signed   By: Genia Del M.D.   On: 03/04/2017 14:26   Ct Cervical Spine Wo Contrast  Result Date: 03/04/2017 CLINICAL DATA:  83 year old female post fall (tripping over of electric cord). Denies head or neck discomfort. Initial encounter. EXAM: CT HEAD WITHOUT CONTRAST CT  CERVICAL SPINE WITHOUT CONTRAST TECHNIQUE: Multidetector CT imaging of the head and cervical spine was performed following the standard protocol without intravenous contrast. Multiplanar CT image reconstructions of the cervical spine were also generated. COMPARISON:  09/07/2015 head CT. 06/17/2014 head CT and cervical spine CT. FINDINGS: CT HEAD FINDINGS No intracranial hemorrhage or CT evidence of large acute infarct. Chronic microvascular changes. Atrophy without hydrocephalus. No intracranial mass lesion noted on this unenhanced exam. Vascular: Vascular calcifications Skull: No skull fracture Sinuses/Orbits: Banding right globe without acute abnormality. Visualized paranasal sinuses are clear. Other: Negative CT CERVICAL SPINE FINDINGS Alignment: Within normal limits. Skull base and vertebrae: No cervical spine fracture. Soft tissues and spinal canal: No abnormal prevertebral soft tissue swelling. Disc levels: Spur with mild narrowing ventral thecal sac C4-5 thru C6-7. Upper chest: Negative for worrisome abnormality. Other: Negative for worrisome abnormality. IMPRESSION: No skull fracture or intracranial hemorrhage. No cervical spine fracture or abnormal prevertebral soft tissue swelling. Electronically Signed   By:  Genia Del M.D.   On: 03/04/2017 14:26   Dg Hip Unilat With Pelvis 2-3 Views Left  Result Date: 03/04/2017 CLINICAL DATA:  Fall, acute severe left hip pain EXAM: DG HIP (WITH OR WITHOUT PELVIS) 2-3V LEFT COMPARISON:  None available FINDINGS: There is an acute displaced and angulated left hip subcapital femoral neck fracture. Bones are osteopenic. Bony pelvis appears intact. Degenerative changes of the lower lumbar spine, SI joints, and both hips. Peripheral atherosclerosis evident. Normal bowel gas pattern. IMPRESSION: Acute left hip subcapital femoral neck fracture. Atherosclerosis Electronically Signed   By: Jerilynn Mages.  Shick M.D.   On: 03/04/2017 13:14   Dg Femur Min 2 Views Left  Result Date: 03/04/2017 CLINICAL DATA:  Status post fall.  Left hip pain. EXAM: LEFT FEMUR 2 VIEWS COMPARISON:  None. FINDINGS: Left hip joint is excluded from the field of view. Visualized portions of the left femur demonstrate no fracture or dislocation. Mild tricompartmental osteoarthritis of the left knee. Generalized osteopenia. No significant joint effusion. No aggressive lytic or sclerotic osseous lesion. Peripheral vascular atherosclerotic disease. No soft tissue abnormality. IMPRESSION: Left hip joint is excluded from the field of view and assessed on left hip x-rays performed same day. Visualized left femur demonstrates no acute fracture or dislocation. Electronically Signed   By: Kathreen Devoid   On: 03/04/2017 13:26    Procedures Procedures (including critical care time)  Medications Ordered in ED Medications  busPIRone (BUSPAR) tablet 5 mg (not administered)  amiodarone (PACERONE) tablet 100 mg (100 mg Oral Given 03/04/17 2034)  lisinopril (PRINIVIL,ZESTRIL) tablet 2.5 mg (not administered)  torsemide (DEMADEX) tablet 40 mg (not administered)  multivitamin with minerals tablet 1 tablet (not administered)  HYDROcodone-acetaminophen (NORCO/VICODIN) 5-325 MG per tablet 1-2 tablet (not administered)  morphine 4 MG/ML  injection 0.52 mg (not administered)  heparin injection 5,000 Units (5,000 Units Subcutaneous Given 03/04/17 2037)  0.9 %  sodium chloride infusion ( Intravenous New Bag/Given 03/04/17 2032)  senna-docusate (Senokot-S) tablet 1 tablet (not administered)  milrinone (PRIMACOR) 20 MG/100 ML (0.2 mg/mL) infusion (not administered)  fentaNYL (SUBLIMAZE) injection 50 mcg (50 mcg Intravenous Given 03/04/17 1526)     Initial Impression / Assessment and Plan / ED Course  I have reviewed the triage vital signs and the nursing notes.  Pertinent labs & imaging results that were available during my care of the patient were reviewed by me and considered in my medical decision making (see chart for details).     ARAH ARO is a 83  y.o. female with a past medical history significant for CAD, CHF, AAA, atrial fibrillation on eliquis who presents with a fall and left hip pain as well as several days of cough and shortness of breath. Patient is currently by her husband reports that patient has had a mild dry cough for the last several days as well as shortness of breath. He says that this has happened in the past when she had extra fluid present. He says that today, he left her to go get coffee and when he came back, she had fallen to the ground and tripped over a lamp cord. He is unsure if she had her head or injuries aside from the left hip. Patient is describing moderate pain in her left hip but denies any chest pain, abdominal pain, nausea, or vomiting. She denies any numbness, tingling, or weakness of extremities. She denies any headache or neck pain.  History and exam are seen above. On exam, patient has a leg length difference with shortening of the left leg. Patient has normal sensation, pulses, and plantar flexion strength bilaterally. Patient has tenderness in the left hip area but was otherwise stable. Patient's abdomen and chest are nontender. Patient has crackles in the bases of both lungs. Patient was  found to be hypoxic with an oxygen saturation of approximately 84-86% on room air during my evaluation. Patient placed on oxygen. Patient had no neck tenderness and no focal neurologic deficits.  Based on patient's fall without memory of it as well as her in, patient workup to look for traffic injuries in the head, neck, chest, or hip. With hypoxia, and crackles on exam, suspect fluid overload as well.  Diagnostic testing revealed left hip fracture. Orthopedics quickly called and they requested admission to [facility] for surgical management. They requested hospitalist admission.  Patient had improvement in oxygen saturation on 2L [place]. Clinically, there is concern for fluid overload in the history of her CHF. Patient has crackles in the bases as well as a new oxygen requirement. Chest x-ray did not reveal significant edema. Head and cervical spine imaging showed no injuries. Troponin elevated  and leukocytosis present. No evidence of STEMI.  Patient admitted to hospitalist service for further management of hip fracture and hypoxia. Unclear etiology of hypoxia however, husband reports that shortness of breath and cough preceded the fall, making fat emboli from the fracture less likely.  Patient admitted to hospitalist service.   Of note, nursing reports that patient's husband whom she lives with defecated on the exam room floor. Nursing expressed concern for safety of patient and husband at home alone. Nursing placed an order for social work consult which will likely be performed after admission.    Final Clinical Impressions(s) / ED Diagnoses   Final diagnoses:  Closed fracture of left hip, initial encounter ([place])  Hypoxia     Clinical Impression: 1. Closed fracture of left hip, initial encounter ([place])   2. Hypoxia     Disposition: Admit to Hospitalist service at [facility] for Ortho to see    Renatha Rosen, Gwenyth Allegra, MD 03/04/17 2051

## 2017-03-05 ENCOUNTER — Inpatient Hospital Stay (HOSPITAL_COMMUNITY): Payer: Medicare Other

## 2017-03-05 ENCOUNTER — Inpatient Hospital Stay (HOSPITAL_COMMUNITY): Payer: Medicare Other | Admitting: Certified Registered Nurse Anesthetist

## 2017-03-05 ENCOUNTER — Encounter (HOSPITAL_COMMUNITY)
Admission: EM | Disposition: A | Discharge status: Skilled Nursing Facility | Payer: Self-pay | Source: Home / Self Care | Attending: Family Medicine

## 2017-03-05 DIAGNOSIS — Z01810 Encounter for preprocedural cardiovascular examination: Secondary | ICD-10-CM

## 2017-03-05 DIAGNOSIS — S72002A Fracture of unspecified part of neck of left femur, initial encounter for closed fracture: Secondary | ICD-10-CM

## 2017-03-05 DIAGNOSIS — I5022 Chronic systolic (congestive) heart failure: Secondary | ICD-10-CM

## 2017-03-05 DIAGNOSIS — E440 Moderate protein-calorie malnutrition: Secondary | ICD-10-CM | POA: Insufficient documentation

## 2017-03-05 HISTORY — PX: ANTERIOR APPROACH HEMI HIP ARTHROPLASTY: SHX6690

## 2017-03-05 LAB — BASIC METABOLIC PANEL
Anion gap: 10 (ref 5–15)
BUN: 14 mg/dL (ref 6–20)
CO2: 29 mmol/L (ref 22–32)
Calcium: 8.5 mg/dL — ABNORMAL LOW (ref 8.9–10.3)
Chloride: 102 mmol/L (ref 101–111)
Creatinine, Ser: 0.85 mg/dL (ref 0.44–1.00)
GFR calc Af Amer: >60 mL/min (ref >60)
GFR calc non Af Amer: 58 mL/min — ABNORMAL LOW (ref >60)
Glucose, Bld: 90 mg/dL (ref 65–99)
Potassium: 3.6 mmol/L (ref 3.5–5.1)
Sodium: 141 mmol/L (ref 135–145)

## 2017-03-05 LAB — CBC
HCT: 36.8 % (ref 36.0–46.0)
Hemoglobin: 11.6 g/dL — ABNORMAL LOW (ref 12.0–15.0)
MCH: 28 pg (ref 26.0–34.0)
MCHC: 31.5 g/dL (ref 30.0–36.0)
MCV: 88.9 fL (ref 78.0–100.0)
Platelets: 221 10*3/uL (ref 150–400)
RBC: 4.14 MIL/uL (ref 3.87–5.11)
RDW: 14.5 % (ref 11.5–15.5)
WBC: 9.4 10*3/uL (ref 4.0–10.5)

## 2017-03-05 LAB — TYPE AND SCREEN
ABO/RH(D): O NEG
Antibody Screen: NEGATIVE

## 2017-03-05 LAB — TROPONIN I
Troponin I: 0.17 ng/mL (ref <0.03)
Troponin I: 0.12 ng/mL (ref <0.03)

## 2017-03-05 SURGERY — HEMIARTHROPLASTY, HIP, DIRECT ANTERIOR APPROACH, FOR FRACTURE
Anesthesia: General | Site: Hip | Laterality: Left

## 2017-03-05 MED ORDER — TRANEXAMIC ACID 1000 MG/10ML IV SOLN: INTRAVENOUS | Status: DC | PRN

## 2017-03-05 MED ORDER — CEFAZOLIN SODIUM-DEXTROSE 2-4 GM/100ML-% IV SOLN
2.0000 g | Freq: Four times a day (QID) | INTRAVENOUS | Status: AC
  Administered 2017-03-05 – 2017-03-06 (×3): 2 g via INTRAVENOUS
  Filled 2017-03-05 (×3): qty 100

## 2017-03-05 MED ORDER — ETOMIDATE 2 MG/ML IV SOLN
INTRAVENOUS | Status: AC
  Filled 2017-03-05: qty 10

## 2017-03-05 MED ORDER — SUGAMMADEX SODIUM 200 MG/2ML IV SOLN
INTRAVENOUS | Status: AC
  Filled 2017-03-05: qty 2

## 2017-03-05 MED ORDER — ONDANSETRON HCL 4 MG/2ML IJ SOLN
4.0000 mg | Freq: Three times a day (TID) | INTRAMUSCULAR | Status: DC | PRN
  Administered 2017-03-05: 4 mg via INTRAVENOUS
  Filled 2017-03-05: qty 2

## 2017-03-05 MED ORDER — METOCLOPRAMIDE HCL 5 MG PO TABS
5.0000 mg | ORAL_TABLET | Freq: Three times a day (TID) | ORAL | Status: DC | PRN
  Administered 2017-03-05: 5 mg via ORAL
  Filled 2017-03-05: qty 1

## 2017-03-05 MED ORDER — ETOMIDATE 2 MG/ML IV SOLN
INTRAVENOUS | Status: DC | PRN
  Administered 2017-03-05: 12 mg via INTRAVENOUS

## 2017-03-05 MED ORDER — OXYCODONE HCL 5 MG PO TABS: 5.0000 mg | ORAL_TABLET | ORAL | Status: DC | PRN

## 2017-03-05 MED ORDER — CEFAZOLIN SODIUM 1 G IJ SOLR
INTRAMUSCULAR | Status: DC | PRN
  Administered 2017-03-05: 1 g via INTRAMUSCULAR

## 2017-03-05 MED ORDER — PHENYLEPHRINE HCL 10 MG/ML IJ SOLN
INTRAMUSCULAR | Status: DC | PRN
  Administered 2017-03-05: 120 ug via INTRAVENOUS
  Administered 2017-03-05: 160 ug via INTRAVENOUS
  Administered 2017-03-05: 120 ug via INTRAVENOUS
  Administered 2017-03-05: 80 ug via INTRAVENOUS

## 2017-03-05 MED ORDER — PHENYLEPHRINE HCL 10 MG/ML IJ SOLN
INTRAVENOUS | Status: DC | PRN
  Administered 2017-03-05: 20 ug/min via INTRAVENOUS

## 2017-03-05 MED ORDER — SODIUM CHLORIDE 0.9 % IR SOLN
Status: DC | PRN
  Administered 2017-03-05: 3000 mL

## 2017-03-05 MED ORDER — FENTANYL CITRATE (PF) 100 MCG/2ML IJ SOLN
INTRAMUSCULAR | Status: DC | PRN
  Administered 2017-03-05 (×4): 50 ug via INTRAVENOUS

## 2017-03-05 MED ORDER — SUGAMMADEX SODIUM 200 MG/2ML IV SOLN
INTRAVENOUS | Status: DC | PRN
  Administered 2017-03-05: 90 mg via INTRAVENOUS

## 2017-03-05 MED ORDER — MORPHINE SULFATE (PF) 2 MG/ML IV SOLN: 0.5000 mg | INTRAVENOUS | Status: DC | PRN

## 2017-03-05 MED ORDER — METOCLOPRAMIDE HCL 5 MG/ML IJ SOLN: 5.0000 mg | Freq: Three times a day (TID) | INTRAMUSCULAR | Status: DC | PRN

## 2017-03-05 MED ORDER — HYDROCODONE-ACETAMINOPHEN 5-325 MG PO TABS: 1.0000 | ORAL_TABLET | Freq: Four times a day (QID) | ORAL | 0 refills | Status: DC | PRN

## 2017-03-05 MED ORDER — PHENOL 1.4 % MT LIQD: 1.0000 | OROMUCOSAL | Status: DC | PRN

## 2017-03-05 MED ORDER — SODIUM CHLORIDE 0.9 % IV SOLN
INTRAVENOUS | Status: DC
  Administered 2017-03-05 – 2017-03-06 (×2): via INTRAVENOUS

## 2017-03-05 MED ORDER — MENTHOL 3 MG MT LOZG: 1.0000 | LOZENGE | OROMUCOSAL | Status: DC | PRN

## 2017-03-05 MED ORDER — SODIUM CHLORIDE 0.9% FLUSH
10.0000 mL | INTRAVENOUS | Status: DC | PRN
  Administered 2017-03-07: 10 mL
  Filled 2017-03-05: qty 40

## 2017-03-05 MED ORDER — APIXABAN 2.5 MG PO TABS
2.5000 mg | ORAL_TABLET | Freq: Two times a day (BID) | ORAL | Status: DC
  Administered 2017-03-06 – 2017-03-08 (×4): 2.5 mg via ORAL
  Filled 2017-03-05 (×5): qty 1

## 2017-03-05 MED ORDER — TRANEXAMIC ACID 1000 MG/10ML IV SOLN
1000.0000 mg | INTRAVENOUS | Status: AC
  Administered 2017-03-05 (×2): 1000 mg via INTRAVENOUS
  Filled 2017-03-05: qty 10

## 2017-03-05 MED ORDER — HYDROCODONE-ACETAMINOPHEN 5-325 MG PO TABS: 1.0000 | ORAL_TABLET | Freq: Four times a day (QID) | ORAL | Status: DC | PRN

## 2017-03-05 MED ORDER — SODIUM CHLORIDE 0.9% FLUSH
10.0000 mL | Freq: Two times a day (BID) | INTRAVENOUS | Status: DC
  Administered 2017-03-05 – 2017-03-07 (×3): 10 mL

## 2017-03-05 MED ORDER — ALUM & MAG HYDROXIDE-SIMETH 200-200-20 MG/5ML PO SUSP: 30.0000 mL | ORAL | Status: DC | PRN

## 2017-03-05 MED ORDER — LACTATED RINGERS IV SOLN
INTRAVENOUS | Status: DC | PRN
  Administered 2017-03-05: 16:00:00 via INTRAVENOUS

## 2017-03-05 MED ORDER — 0.9 % SODIUM CHLORIDE (POUR BTL) OPTIME
TOPICAL | Status: DC | PRN
  Administered 2017-03-05: 1000 mL

## 2017-03-05 MED ORDER — METHOCARBAMOL 1000 MG/10ML IJ SOLN
500.0000 mg | Freq: Four times a day (QID) | INTRAVENOUS | Status: DC | PRN
  Filled 2017-03-05: qty 5

## 2017-03-05 MED ORDER — ONDANSETRON HCL 4 MG/2ML IJ SOLN
INTRAMUSCULAR | Status: DC | PRN
  Administered 2017-03-05: 4 mg via INTRAVENOUS

## 2017-03-05 MED ORDER — SODIUM CHLORIDE 0.9 % IV SOLN
2000.0000 mg | Freq: Once | INTRAVENOUS | Status: DC
  Filled 2017-03-05: qty 20

## 2017-03-05 MED ORDER — ACETAMINOPHEN 325 MG PO TABS
650.0000 mg | ORAL_TABLET | Freq: Four times a day (QID) | ORAL | Status: DC | PRN
  Administered 2017-03-06 – 2017-03-08 (×2): 650 mg via ORAL
  Filled 2017-03-05 (×2): qty 2

## 2017-03-05 MED ORDER — FENTANYL CITRATE (PF) 250 MCG/5ML IJ SOLN
INTRAMUSCULAR | Status: AC
  Filled 2017-03-05: qty 5

## 2017-03-05 MED ORDER — ONDANSETRON HCL 4 MG PO TABS: 4.0000 mg | ORAL_TABLET | Freq: Four times a day (QID) | ORAL | Status: DC | PRN

## 2017-03-05 MED ORDER — ACETAMINOPHEN 650 MG RE SUPP: 650.0000 mg | Freq: Four times a day (QID) | RECTAL | Status: DC | PRN

## 2017-03-05 MED ORDER — PHENYLEPHRINE 40 MCG/ML (10ML) SYRINGE FOR IV PUSH (FOR BLOOD PRESSURE SUPPORT)
PREFILLED_SYRINGE | INTRAVENOUS | Status: AC
  Filled 2017-03-05: qty 10

## 2017-03-05 MED ORDER — METHOCARBAMOL 500 MG PO TABS: 500.0000 mg | ORAL_TABLET | Freq: Four times a day (QID) | ORAL | Status: DC | PRN

## 2017-03-05 MED ORDER — ONDANSETRON HCL 4 MG/2ML IJ SOLN
4.0000 mg | Freq: Four times a day (QID) | INTRAMUSCULAR | Status: DC | PRN
  Administered 2017-03-05: 4 mg via INTRAVENOUS
  Filled 2017-03-05: qty 2

## 2017-03-05 MED ORDER — EPHEDRINE SULFATE 50 MG/ML IJ SOLN
INTRAMUSCULAR | Status: DC | PRN
  Administered 2017-03-05: 10 mg via INTRAVENOUS

## 2017-03-05 MED ORDER — APIXABAN 2.5 MG PO TABS: 2.5000 mg | ORAL_TABLET | Freq: Two times a day (BID) | ORAL | 0 refills | Status: DC

## 2017-03-05 MED ORDER — ROCURONIUM BROMIDE 100 MG/10ML IV SOLN
INTRAVENOUS | Status: DC | PRN
  Administered 2017-03-05: 40 mg via INTRAVENOUS

## 2017-03-05 SURGICAL SUPPLY — 41 items
BAG DECANTER FOR FLEXI CONT (MISCELLANEOUS) ×3 IMPLANT
CAPT HIP HEMI 2 ×3 IMPLANT
CELLS DAT CNTRL 66122 CELL SVR (MISCELLANEOUS) ×1 IMPLANT
COVER SURGICAL LIGHT HANDLE (MISCELLANEOUS) ×3 IMPLANT
DRAPE C-ARM 42X72 X-RAY (DRAPES) ×3 IMPLANT
DRAPE STERI IOBAN 125X83 (DRAPES) ×3 IMPLANT
DRAPE U-SHAPE 47X51 STRL (DRAPES) ×6 IMPLANT
DRSG AQUACEL AG ADV 3.5X10 (GAUZE/BANDAGES/DRESSINGS) ×3 IMPLANT
DRSG MEPILEX BORDER 4X8 (GAUZE/BANDAGES/DRESSINGS) ×3 IMPLANT
DURAPREP 26ML APPLICATOR (WOUND CARE) ×3 IMPLANT
ELECT BLADE 4.0 EZ CLEAN MEGAD (MISCELLANEOUS) ×3
ELECT REM PT RETURN 9FT ADLT (ELECTROSURGICAL) ×3
ELECTRODE BLDE 4.0 EZ CLN MEGD (MISCELLANEOUS) ×1 IMPLANT
ELECTRODE REM PT RTRN 9FT ADLT (ELECTROSURGICAL) ×1 IMPLANT
GLOVE SKINSENSE NS SZ7.5 (GLOVE) ×2
GLOVE SKINSENSE STRL SZ7.5 (GLOVE) ×1 IMPLANT
GLOVE SURG SYN 7.5  E (GLOVE) ×10
GLOVE SURG SYN 7.5 E (GLOVE) ×5 IMPLANT
GOWN SRG XL XLNG 56XLVL 4 (GOWN DISPOSABLE) ×1 IMPLANT
GOWN STRL NON-REIN XL XLG LVL4 (GOWN DISPOSABLE) ×2
GOWN STRL REUS W/ TWL LRG LVL3 (GOWN DISPOSABLE) IMPLANT
GOWN STRL REUS W/TWL LRG LVL3 (GOWN DISPOSABLE)
HANDPIECE INTERPULSE COAX TIP (DISPOSABLE) ×2
HOOD PEEL AWAY FLYTE STAYCOOL (MISCELLANEOUS) ×6 IMPLANT
IV NS IRRIG 3000ML ARTHROMATIC (IV SOLUTION) ×3 IMPLANT
KIT BASIN OR (CUSTOM PROCEDURE TRAY) ×3 IMPLANT
MARKER SKIN DUAL TIP RULER LAB (MISCELLANEOUS) ×3 IMPLANT
PACK TOTAL JOINT (CUSTOM PROCEDURE TRAY) ×3 IMPLANT
PACK UNIVERSAL I (CUSTOM PROCEDURE TRAY) ×3 IMPLANT
RTRCTR WOUND ALEXIS 18CM MED (MISCELLANEOUS) ×3
SAW OSC TIP CART 19.5X105X1.3 (SAW) ×3 IMPLANT
SET HNDPC FAN SPRY TIP SCT (DISPOSABLE) ×1 IMPLANT
STAPLER VISISTAT 35W (STAPLE) IMPLANT
SUT ETHIBOND 2 V 37 (SUTURE) ×3 IMPLANT
SUT VIC AB 1 CT1 27 (SUTURE) ×2
SUT VIC AB 1 CT1 27XBRD ANBCTR (SUTURE) ×1 IMPLANT
SUT VIC AB 2-0 CT1 27 (SUTURE) ×2
SUT VIC AB 2-0 CT1 TAPERPNT 27 (SUTURE) ×1 IMPLANT
TOWEL OR 17X26 10 PK STRL BLUE (TOWEL DISPOSABLE) ×3 IMPLANT
TRAY CATH 16FR W/PLASTIC CATH (SET/KITS/TRAYS/PACK) ×3 IMPLANT
YANKAUER SUCT BULB TIP NO VENT (SUCTIONS) ×3 IMPLANT

## 2017-03-05 NOTE — H&P (Signed)

## 2017-03-05 NOTE — Op Note (Signed)
ANTERIOR APPROACH LEFT HIP HEMI ARTHROPLASTY  Procedure Note Jacqueline Reilly   [id number]  Pre-op Diagnosis: left femoral neck fracture     Post-op Diagnosis: same   Operative Procedures  1. Prosthetic replacement for femoral neck fracture. CPT [phone]  Personnel  Surgeon(s): Leandrew Koyanagi, MD   Anesthesia: general  Prosthesis: depuy Femur: corail KA 12 Head: 47 mm size: +1.5 Bearing Type: bipolar  Hip Hemiarthroplasty (Anterior Approach) Op Note:  After informed consent was obtained and the operative extremity marked in the holding area, the patient was brought back to the operating room and placed supine on the HANA table. Next, the operative extremity was prepped and draped in normal sterile fashion. Surgical timeout occurred verifying patient identification, surgical site, surgical procedure and administration of antibiotics.  A modified anterior Smith-Peterson approach to the hip was performed, using the interval between tensor fascia lata and sartorius.  Dissection was carried bluntly down onto the anterior hip capsule. The lateral femoral circumflex vessels were identified and coagulated. A capsulotomy was performed and the capsular flaps tagged for later repair.  Fluoroscopy was utilized to prepare for the femoral neck cut. The neck osteotomy was performed. The femoral head was removed and found a 47 mm head was the appropriate fit.    We then turned our attention to the femur.  After placing the femoral hook, the leg was taken to externally rotated, extended and adducted position taking care to perform soft tissue releases to allow for adequate mobilization of the femur. Soft tissue was cleared from the shoulder of the greater trochanter and the hook elevator used to improve exposure of the proximal femur. Sequential broaching performed up to a size 12. Trial neck and head were placed. The leg was brought back up to neutral and the construct reduced. The position and sizing of  components, offset and leg lengths were checked using fluoroscopy. Stability of the construct was checked in extension and external rotation without any subluxation or impingement of prosthesis. We dislocated the prosthesis, dropped the leg back into position, removed trial components, and irrigated copiously. The final stem and head was then placed, the leg brought back up, the system reduced and fluoroscopy used to verify positioning.  We irrigated, obtained hemostasis and closed the capsule using #2 ethibond suture.  The fascia was closed with #1 vicryl plus, the deep fat layer was closed with 0 vicryl, the subcutaneous layers closed with 2.0 Vicryl Plus and the skin closed with staples. A sterile dressing was applied. The patient was awakened in the operating room and taken to recovery in stable condition. All sponge, needle, and instrument counts were correct at the end of the case.   Position: supine  Complications: none.  Time Out: performed   Drains/Packing: none  Estimated blood loss: 200 cc  Returned to Recovery Room: in good condition.   Antibiotics: yes   Mechanical VTE (DVT) Prophylaxis: sequential compression devices, TED thigh-high  Chemical VTE (DVT) Prophylaxis: resume eliquis  Fluid Replacement: Crystalloid: see anesthesia record  Specimens Removed: 1 to pathology   Sponge and Instrument Count Correct? yes   PACU: portable radiograph - low AP   Admission: inpatient status, start PT & OT POD#1  Plan/RTC: Return in 2 weeks for staple removal. Return in 6 weeks to see MD.  Weight Bearing/Load Lower Extremity: full  Hip precautions: none Suture Removal: 10-14 days  Betadine to incision twice daily once dressing is removed on POD#7  N. Eduard Roux, MD [place]  5:51 PM     Implant Name Type Inv. Item Serial No. Manufacturer Lot No. LRB No. Used  STEM CORAIL KA12 - [id number] Stem STEM CORAIL KA12  DEPUY SYNTHES 531 Left 1

## 2017-03-05 NOTE — Progress Notes (Signed)
Spoke with both pt as well as her son about the plan for surgery. Pt is confused & doesn't remember speaking with the doctor earlier today. Pt refused to have surgery or sign consent until her husband gets here. RN has called the husband several times without an answer. RN also call son who stated his mom is confused. Will notify pt's doctors. Hoover Brunette, RN

## 2017-03-05 NOTE — Progress Notes (Signed)
PROGRESS NOTE    Jacqueline Reilly  MOQ:[id number] DOB: 08/15/1924 DOA: 03/04/2017 PCP: [name], MD    Brief Narrative:  83 year old female with a past medical history significant of atrial fibrillation (chronically on liquids), ischemic cardiomyopathy with chronic systolic heart failure (ejection fraction 30-35% and is chronically on milrinone), hypertension and hyperlipidemia; who presented to the hospital secondary to left hip. Patient reports experiencing a mechanical fall this morning and landed on her left side.   Further evaluation revealed: acute left hip fracture  Assessment & Plan:   Principal Problem:   Closed left hip fracture, initial encounter [facility]) - Orthopedic surgeon and managing. -Continue supportive therapy - Per last note written by nurse orthopedic surgeon plans a consent again this afternoon - Have discussed with patient and husband outcome if patient does not get operation - Per my discussion with patient she wished to proceed with operation and stated, "something needs to be done"  Active Problems:   Ischemic dilated cardiomyopathy ([place])   Hypertension   Chronic systolic heart failure ([place])   Persistent atrial fibrillation ([place]) - on amiodarone - cardiology following    Leukocytosis - Most likely reactive    Hypoxia - Continue supplemental oxygen    Malnutrition of moderate degree - Agree with dietitian findings. Once able to advance diet will also add nutritional supplementation   DVT prophylaxis: Defer to Ortho  Code Status: Partial, No cpr, yes intubation Family Communication: (d/c spouse, intented to call son 2 times and went to VM Disposition Plan: pending decision by patient   Consultants:   Cardiology  Orthopaedic surgery   Procedures: pending    Antimicrobials: None   Subjective: Pt has no new complaints. No acute issues overnight.  Objective: Vitals:   03/04/17 2025 03/04/17 2100 03/05/17 0445 03/05/17 1211  BP:  125/70  (!) 104/57 99/71  Pulse: 82  71 66  Resp: 16  16 16   Temp: 98.4 F (36.9 C)  97.9 F (36.6 C) 98 F (36.7 C)  TempSrc: Oral  Oral Oral  SpO2: 98%  97% 98%  Weight:  43.9 kg (96 lb 12.8 oz) 43.5 kg (96 lb) 45 kg (99 lb 3.2 oz)  Height:  5' (1.524 m)  5' (1.524 m)    Intake/Output Summary (Last 24 hours) at 03/05/17 1428 Last data filed at 03/05/17 1232  Gross per 24 hour  Intake           187.31 ml  Output                0 ml  Net           187.31 ml   Filed Weights   03/04/17 2100 03/05/17 0445 03/05/17 1211  Weight: 43.9 kg (96 lb 12.8 oz) 43.5 kg (96 lb) 45 kg (99 lb 3.2 oz)    Examination:  General exam: Appears calm and comfortable, in nad. Respiratory system: Clear to auscultation. Respiratory effort normal. Cardiovascular system: S1 & S2 heard, RRR Gastrointestinal system: Abdomen is nondistended, soft and nontender. No organomegaly or masses felt. Normal bowel sounds heard. Central nervous system: Alert and oriented. No facial asymmetry, responds to questions appropriately Extremities: warm and pink. Skin: No rashes, lesions or ulcers on limited exam. Psychiatry: Mood & affect appropriate.   Data Reviewed: I have personally reviewed following labs and imaging studies  CBC:  Recent Labs Lab 03/04/17 1235 03/04/17 1915 03/05/17 0648  WBC 18.3* 10.8* 9.4  NEUTROABS 16.4*  --   --   HGB  13.2 12.4 11.6*  HCT 39.8 38.3 36.8  MCV 88.8 88.5 88.9  PLT 272 244 741   Basic Metabolic Panel:  Recent Labs Lab 03/04/17 1235 03/04/17 1915 03/05/17 0648  NA 140  --  141  K 3.6  --  3.6  CL 100*  --  102  CO2 27  --  29  GLUCOSE 132*  --  90  BUN 20  --  14  CREATININE 0.99 1.00 0.85  CALCIUM 9.4  --  8.5*   GFR: Estimated Creatinine Clearance: 30.6 mL/min (by C-G formula based on SCr of 0.85 mg/dL). Liver Function Tests:  Recent Labs Lab 03/04/17 1235  AST 26  ALT 18  ALKPHOS 89  BILITOT 0.7  PROT 7.4  ALBUMIN 4.2   No results for  input(s): LIPASE, AMYLASE in the last 168 hours. No results for input(s): AMMONIA in the last 168 hours. Coagulation Profile:  Recent Labs Lab 03/04/17 1235  INR 1.10   Cardiac Enzymes:  Recent Labs Lab 03/04/17 1915 03/05/17 0129 03/05/17 0648  TROPONINI 0.23* 0.17* 0.12*   BNP (last 3 results) No results for input(s): PROBNP in the last 8760 hours. HbA1C: No results for input(s): HGBA1C in the last 72 hours. CBG: No results for input(s): GLUCAP in the last 168 hours. Lipid Profile: No results for input(s): CHOL, HDL, LDLCALC, TRIG, CHOLHDL, LDLDIRECT in the last 72 hours. Thyroid Function Tests:  Recent Labs  03/04/17 1915  TSH 0.394   Anemia Panel: No results for input(s): VITAMINB12, FOLATE, FERRITIN, TIBC, IRON, RETICCTPCT in the last 72 hours. Sepsis Labs: No results for input(s): PROCALCITON, LATICACIDVEN in the last 168 hours.  Recent Results (from the past 240 hour(s))  Surgical PCR screen     Status: Abnormal   Collection Time: 03/04/17  7:04 PM  Result Value Ref Range Status   MRSA, PCR NEGATIVE NEGATIVE Final   Staphylococcus aureus POSITIVE (A) NEGATIVE Final    Comment:        The Xpert SA Assay (FDA approved for NASAL specimens in patients over 57 years of age), is one component of a comprehensive surveillance program.  Test performance has been validated by [facility] for patients greater than or equal to 4 year old. It is not intended to diagnose infection nor to guide or monitor treatment.      Radiology Studies: Dg Chest 2 View  Result Date: 03/04/2017 CLINICAL DATA:  83 year old female fell this morning. Left hip pain. Initial encounter. EXAM: CHEST  2 VIEW COMPARISON:  01/08/2016 chest x-ray. FINDINGS: Right PICC line tip mid to distal superior vena cava level. Cardiomegaly. Tortuous aorta. Central pulmonary vascular prominence without pulmonary edema or segmental consolidation. No obvious rib fracture or pneumothorax. Bilateral  shoulder joint degenerative changes. Probable calcified fibroadenoma left breast. IMPRESSION: Cardiomegaly. Tortuous aorta. Central pulmonary vascular prominence without pulmonary edema. Bilateral shoulder joint degenerative changes. Electronically Signed   By: Genia Del M.D.   On: 03/04/2017 13:31   Ct Head Wo Contrast  Result Date: 03/04/2017 CLINICAL DATA:  83 year old female post fall (tripping over of electric cord). Denies head or neck discomfort. Initial encounter. EXAM: CT HEAD WITHOUT CONTRAST CT CERVICAL SPINE WITHOUT CONTRAST TECHNIQUE: Multidetector CT imaging of the head and cervical spine was performed following the standard protocol without intravenous contrast. Multiplanar CT image reconstructions of the cervical spine were also generated. COMPARISON:  09/07/2015 head CT. 06/17/2014 head CT and cervical spine CT. FINDINGS: CT HEAD FINDINGS No intracranial hemorrhage or CT evidence of  large acute infarct. Chronic microvascular changes. Atrophy without hydrocephalus. No intracranial mass lesion noted on this unenhanced exam. Vascular: Vascular calcifications Skull: No skull fracture Sinuses/Orbits: Banding right globe without acute abnormality. Visualized paranasal sinuses are clear. Other: Negative CT CERVICAL SPINE FINDINGS Alignment: Within normal limits. Skull base and vertebrae: No cervical spine fracture. Soft tissues and spinal canal: No abnormal prevertebral soft tissue swelling. Disc levels: Spur with mild narrowing ventral thecal sac C4-5 thru C6-7. Upper chest: Negative for worrisome abnormality. Other: Negative for worrisome abnormality. IMPRESSION: No skull fracture or intracranial hemorrhage. No cervical spine fracture or abnormal prevertebral soft tissue swelling. Electronically Signed   By: Genia Del M.D.   On: 03/04/2017 14:26   Ct Cervical Spine Wo Contrast  Result Date: 03/04/2017 CLINICAL DATA:  83 year old female post fall (tripping over of electric cord). Denies head  or neck discomfort. Initial encounter. EXAM: CT HEAD WITHOUT CONTRAST CT CERVICAL SPINE WITHOUT CONTRAST TECHNIQUE: Multidetector CT imaging of the head and cervical spine was performed following the standard protocol without intravenous contrast. Multiplanar CT image reconstructions of the cervical spine were also generated. COMPARISON:  09/07/2015 head CT. 06/17/2014 head CT and cervical spine CT. FINDINGS: CT HEAD FINDINGS No intracranial hemorrhage or CT evidence of large acute infarct. Chronic microvascular changes. Atrophy without hydrocephalus. No intracranial mass lesion noted on this unenhanced exam. Vascular: Vascular calcifications Skull: No skull fracture Sinuses/Orbits: Banding right globe without acute abnormality. Visualized paranasal sinuses are clear. Other: Negative CT CERVICAL SPINE FINDINGS Alignment: Within normal limits. Skull base and vertebrae: No cervical spine fracture. Soft tissues and spinal canal: No abnormal prevertebral soft tissue swelling. Disc levels: Spur with mild narrowing ventral thecal sac C4-5 thru C6-7. Upper chest: Negative for worrisome abnormality. Other: Negative for worrisome abnormality. IMPRESSION: No skull fracture or intracranial hemorrhage. No cervical spine fracture or abnormal prevertebral soft tissue swelling. Electronically Signed   By: Genia Del M.D.   On: 03/04/2017 14:26   Dg Hip Unilat With Pelvis 2-3 Views Left  Result Date: 03/04/2017 CLINICAL DATA:  Fall, acute severe left hip pain EXAM: DG HIP (WITH OR WITHOUT PELVIS) 2-3V LEFT COMPARISON:  None available FINDINGS: There is an acute displaced and angulated left hip subcapital femoral neck fracture. Bones are osteopenic. Bony pelvis appears intact. Degenerative changes of the lower lumbar spine, SI joints, and both hips. Peripheral atherosclerosis evident. Normal bowel gas pattern. IMPRESSION: Acute left hip subcapital femoral neck fracture. Atherosclerosis Electronically Signed   By: Jerilynn Mages.  Shick  M.D.   On: 03/04/2017 13:14   Dg Femur Min 2 Views Left  Result Date: 03/04/2017 CLINICAL DATA:  Status post fall.  Left hip pain. EXAM: LEFT FEMUR 2 VIEWS COMPARISON:  None. FINDINGS: Left hip joint is excluded from the field of view. Visualized portions of the left femur demonstrate no fracture or dislocation. Mild tricompartmental osteoarthritis of the left knee. Generalized osteopenia. No significant joint effusion. No aggressive lytic or sclerotic osseous lesion. Peripheral vascular atherosclerotic disease. No soft tissue abnormality. IMPRESSION: Left hip joint is excluded from the field of view and assessed on left hip x-rays performed same day. Visualized left femur demonstrates no acute fracture or dislocation. Electronically Signed   By: Kathreen Devoid   On: 03/04/2017 13:26    Scheduled Meds: . amiodarone  100 mg Oral Daily  . lisinopril  2.5 mg Oral Daily  . multivitamin with minerals  1 tablet Oral Daily  . sodium chloride flush  10-40 mL Intracatheter Q12H  . torsemide  40 mg Oral BID   Continuous Infusions: . milrinone 0.125 mcg/kg/min (03/04/17 2116)     LOS: 1 day    Time spent: > 35 minutes  Velvet Bathe, MD [facility] Pager [phone]  If 7PM-7AM, please contact night-coverage [email or web address] Password [email or web address] 03/05/2017, 2:28 PM

## 2017-03-05 NOTE — Progress Notes (Signed)
Received from OR with Milrinone at 0.48mcg/kg/ml.

## 2017-03-05 NOTE — Discharge Instructions (Signed)
    1. Change dressings as needed 2. May shower but keep incisions covered and dry 3. Take eliquis to prevent blood clots 4. Take stool softeners as needed 5. Take pain meds as needed   

## 2017-03-05 NOTE — Progress Notes (Addendum)
Initial Nutrition Assessment  DOCUMENTATION CODES:   Non-severe (moderate) malnutrition in context of chronic illness  INTERVENTION:    Advance diet as medically appropriate, add supplements when able  NUTRITION DIAGNOSIS:   Malnutrition (moderate) related to chronic illness (end stage CHF) as evidenced by moderate depletions of muscle mass, moderate depletion of body fat  GOAL:   Patient will meet greater than or equal to 90% of their needs  MONITOR:   Diet advancement, PO intake, Supplement acceptance, Labs, Weight trends, I & O's  REASON FOR ASSESSMENT:   Consult Hip fracture protocol  ASSESSMENT:   83 y.o. Female with history of CAD, AAA, End stage chronic systolic CHF requiring milrinone, mild dementia, afib, and CAD; presented to ED due to mechanical fall after being left home alone.  Pt sleepy upon RD visit.  Feeling nauseous. Doesn't remember if she ate breakfast.  Currently NPO for surgery this afternoon. Typically consumes 2 meals per day; does not drink nutrition supplements. Also reveals she's had no "substantial" weight loss recently. Labs reviewed.  Medications reviewed and include MVI.  Nutrition-Focused physical exam completed. Findings are moderate fat depletion, moderate muscle depletion, and no edema.   Diet Order:  Diet NPO time specified  Skin:  Reviewed, no issues  Last BM:  N/A  Height:   Ht Readings from Last 1 Encounters:  03/05/17 5' (1.524 m)   Weight:   Wt Readings from Last 1 Encounters:  03/05/17 99 lb 3.2 oz (45 kg)    Ideal Body Weight:  54 kg  BMI:  Body mass index is 19.37 kg/m.  Estimated Nutritional Needs:   Kcal:  1100-1300  Protein:  60-70 gm  Fluid:  >/= 1.5 L  EDUCATION NEEDS:   No education needs identified at this time  Arthur Holms, RD, LDN Pager #: [phone] After-Hours Pager #: [phone]

## 2017-03-05 NOTE — Progress Notes (Signed)
Pt had 12 beat run of vtach right before shift change, will pass along to day shift nurse to make MD aware.

## 2017-03-05 NOTE — Progress Notes (Signed)
Patients husband refused for her to have EKG done and yelled at nurse tech stating "I'm going to be very angry if we don't get any rest today and to leave out of room" . Patients husband also stated to me that he "wants to speak with the MD before anything is done to his wife".

## 2017-03-05 NOTE — Consult Note (Signed)
ORTHOPAEDIC CONSULTATION  REQUESTING PHYSICIAN: Velvet Bathe, MD  Chief Complaint: Left femoral neck hip fracture  HPI: Jacqueline Reilly is a 83 y.o. female who presents with left femoral neck hip fracture s/p mechanical fall PTA.  HPI details are mainly from husband.  She ambulates with walker at baseline.  Mainly homebound.  Ortho consulted for hip fracture.  Past Medical History:  Diagnosis Date  . AAA (abdominal aortic aneurysm) ([place])   . Atrial fibrillation ([place])   . CHF (congestive heart failure) ([place])   . Chronic systolic heart failure ([place])    a. ECHO (04/2014) EF 20-25%, diff HK, mild MR  . Coronary artery disease 2007   moderate ASCAD of the left system s/p PCI of the D2 and PCI of the left circ 03/2009  . Hyperlipidemia   . Hypertension   . PVD (peripheral vascular disease) ([place])   . Renal artery stenosis ([place])   . Ventricular dysfunction    left; ischemic   Past Surgical History:  Procedure Laterality Date  . ANGIOPLASTY    . CORONARY STENT PLACEMENT    . IR GENERIC HISTORICAL  09/25/2016   IR FLUORO GUIDE CV LINE RIGHT 09/25/2016 Ardis Rowan, PA-C [facility]INTER[facility]  . retinal cryopexy     right eye (for retinal detachment)   Social History   Social History  . Marital status: Married    Spouse name: N/A  . Number of children: 1  . Years of education: N/A   Occupational History  . retired    Social History Main Topics  . Smoking status: Never Smoker  . Smokeless tobacco: Never Used  . Alcohol use Yes     Comment: occasionally  . Drug use: No  . Sexual activity: Not Currently   Other Topics Concern  . None   Social History Narrative   She lives in [place], family helps with her care.   Family History  Problem Relation Age of Onset  . Heart disease Mother   . Heart disease Father   . Heart disease Brother     x 3   Allergies  Allergen Reactions  . Codeine Nausea Only  . Garlic Nausea Only   Prior to Admission medications     Medication Sig Start Date End Date Taking? Authorizing Provider  apixaban (ELIQUIS) 2.5 MG TABS tablet Take 1 tablet (2.5 mg total) by mouth 2 (two) times daily. 11/22/16  Yes Bensimhon, Shaune Pascal, MD  busPIRone (BUSPAR) 5 MG tablet Take 1 tablet (5 mg total) by mouth daily as needed. 02/03/17  Yes Bensimhon, Shaune Pascal, MD  lisinopril (PRINIVIL,ZESTRIL) 2.5 MG tablet TAKE ONE TABLET BY MOUTH ONE TIME DAILY 11/21/16  Yes Bensimhon, Shaune Pascal, MD  Multiple Vitamin (MULTIVITAMIN) tablet Take 1 tablet by mouth daily. 07/26/14  Yes Larey Dresser, MD  Omega-3 Fatty Acids (FISH OIL) 1000 MG CAPS Take 1 capsule by mouth daily.   Yes [provider]  PACERONE 200 MG tablet TAKE 0.5 TABLETS (100 MG TOTAL) BY MOUTH DAILY. 01/09/17  Yes Bensimhon, Shaune Pascal, MD  sodium chloride 0.9 % SOLN with milrinone 1 MG/ML SOLN 200 mcg/mL Inject into the vein continuous. 0.125 mcg/kg/min 08/28/16  Yes [provider]  torsemide (DEMADEX) 20 MG tablet TAKE TWO TABLETS BY MOUTH TWICE DAILY 01/31/17  Yes Bensimhon, Shaune Pascal, MD   Dg Chest 2 View  Result Date: 03/04/2017 CLINICAL DATA:  83 year old female fell this morning. Left hip pain. Initial encounter. EXAM: CHEST  2 VIEW  COMPARISON:  01/08/2016 chest x-ray. FINDINGS: Right PICC line tip mid to distal superior vena cava level. Cardiomegaly. Tortuous aorta. Central pulmonary vascular prominence without pulmonary edema or segmental consolidation. No obvious rib fracture or pneumothorax. Bilateral shoulder joint degenerative changes. Probable calcified fibroadenoma left breast. IMPRESSION: Cardiomegaly. Tortuous aorta. Central pulmonary vascular prominence without pulmonary edema. Bilateral shoulder joint degenerative changes. Electronically Signed   By: Genia Del M.D.   On: 03/04/2017 13:31   Ct Head Wo Contrast  Result Date: 03/04/2017 CLINICAL DATA:  84 year old female post fall (tripping over of electric cord). Denies head or neck discomfort. Initial  encounter. EXAM: CT HEAD WITHOUT CONTRAST CT CERVICAL SPINE WITHOUT CONTRAST TECHNIQUE: Multidetector CT imaging of the head and cervical spine was performed following the standard protocol without intravenous contrast. Multiplanar CT image reconstructions of the cervical spine were also generated. COMPARISON:  09/07/2015 head CT. 06/17/2014 head CT and cervical spine CT. FINDINGS: CT HEAD FINDINGS No intracranial hemorrhage or CT evidence of large acute infarct. Chronic microvascular changes. Atrophy without hydrocephalus. No intracranial mass lesion noted on this unenhanced exam. Vascular: Vascular calcifications Skull: No skull fracture Sinuses/Orbits: Banding right globe without acute abnormality. Visualized paranasal sinuses are clear. Other: Negative CT CERVICAL SPINE FINDINGS Alignment: Within normal limits. Skull base and vertebrae: No cervical spine fracture. Soft tissues and spinal canal: No abnormal prevertebral soft tissue swelling. Disc levels: Spur with mild narrowing ventral thecal sac C4-5 thru C6-7. Upper chest: Negative for worrisome abnormality. Other: Negative for worrisome abnormality. IMPRESSION: No skull fracture or intracranial hemorrhage. No cervical spine fracture or abnormal prevertebral soft tissue swelling. Electronically Signed   By: Genia Del M.D.   On: 03/04/2017 14:26   Ct Cervical Spine Wo Contrast  Result Date: 03/04/2017 CLINICAL DATA:  83 year old female post fall (tripping over of electric cord). Denies head or neck discomfort. Initial encounter. EXAM: CT HEAD WITHOUT CONTRAST CT CERVICAL SPINE WITHOUT CONTRAST TECHNIQUE: Multidetector CT imaging of the head and cervical spine was performed following the standard protocol without intravenous contrast. Multiplanar CT image reconstructions of the cervical spine were also generated. COMPARISON:  09/07/2015 head CT. 06/17/2014 head CT and cervical spine CT. FINDINGS: CT HEAD FINDINGS No intracranial hemorrhage or CT evidence  of large acute infarct. Chronic microvascular changes. Atrophy without hydrocephalus. No intracranial mass lesion noted on this unenhanced exam. Vascular: Vascular calcifications Skull: No skull fracture Sinuses/Orbits: Banding right globe without acute abnormality. Visualized paranasal sinuses are clear. Other: Negative CT CERVICAL SPINE FINDINGS Alignment: Within normal limits. Skull base and vertebrae: No cervical spine fracture. Soft tissues and spinal canal: No abnormal prevertebral soft tissue swelling. Disc levels: Spur with mild narrowing ventral thecal sac C4-5 thru C6-7. Upper chest: Negative for worrisome abnormality. Other: Negative for worrisome abnormality. IMPRESSION: No skull fracture or intracranial hemorrhage. No cervical spine fracture or abnormal prevertebral soft tissue swelling. Electronically Signed   By: Genia Del M.D.   On: 03/04/2017 14:26   Dg Hip Unilat With Pelvis 2-3 Views Left  Result Date: 03/04/2017 CLINICAL DATA:  Fall, acute severe left hip pain EXAM: DG HIP (WITH OR WITHOUT PELVIS) 2-3V LEFT COMPARISON:  None available FINDINGS: There is an acute displaced and angulated left hip subcapital femoral neck fracture. Bones are osteopenic. Bony pelvis appears intact. Degenerative changes of the lower lumbar spine, SI joints, and both hips. Peripheral atherosclerosis evident. Normal bowel gas pattern. IMPRESSION: Acute left hip subcapital femoral neck fracture. Atherosclerosis Electronically Signed   By: Jerilynn Mages.  Shick M.D.   On:  03/04/2017 13:14   Dg Femur Min 2 Views Left  Result Date: 03/04/2017 CLINICAL DATA:  Status post fall.  Left hip pain. EXAM: LEFT FEMUR 2 VIEWS COMPARISON:  None. FINDINGS: Left hip joint is excluded from the field of view. Visualized portions of the left femur demonstrate no fracture or dislocation. Mild tricompartmental osteoarthritis of the left knee. Generalized osteopenia. No significant joint effusion. No aggressive lytic or sclerotic osseous lesion.  Peripheral vascular atherosclerotic disease. No soft tissue abnormality. IMPRESSION: Left hip joint is excluded from the field of view and assessed on left hip x-rays performed same day. Visualized left femur demonstrates no acute fracture or dislocation. Electronically Signed   By: Kathreen Devoid   On: 03/04/2017 13:26    All pertinent xrays, MRI, CT independently reviewed and interpreted  Positive ROS: All other systems have been reviewed and were otherwise negative with the exception of those mentioned in the HPI and as above.  Physical Exam: General: Alert, no acute distress Cardiovascular: No pedal edema Respiratory: No cyanosis, no use of accessory musculature GI: No organomegaly, abdomen is soft and non-tender Skin: No lesions in the area of chief complaint Neurologic: Sensation intact distally Psychiatric: Patient is competent for consent with normal mood and affect Lymphatic: No axillary or cervical lymphadenopathy  MUSCULOSKELETAL:  - pain with movement of the hip and extremity - skin intact - NVI distally - compartments soft  Assessment: Left femoral neck hip fracture  Plan: - partial hip replacement is recommended, patient and family are aware of r/b/a and wish to proceed - consent obtained - medical optimization per primary team - cardiology consult pending - surgery is planned for this afternoon  Thank you for the consult and the opportunity to see Jacqueline Reilly. Eduard Roux, MD [place] 7:25 AM

## 2017-03-05 NOTE — Progress Notes (Signed)
Orthopedic Tech Progress Note Patient Details:  Jacqueline Reilly 11-Sep-1924 [id number]  Patient ID: Jacqueline Reilly, female   DOB: Sep 10, 1924, 83 y.o.   MRN: [id number]   Hildred Priest Pt unable to use trapeze bar patient helper; RN notified

## 2017-03-05 NOTE — Progress Notes (Signed)
Patient transferring to 3W06. Report called and given to [place], Therapist, sports. Patients son called and is aware about transfer along with patients husband. Patient ready for transfer.

## 2017-03-05 NOTE — Anesthesia Procedure Notes (Signed)
Procedure Name: Intubation Date/Time: 03/05/2017 4:49 PM Performed by: Lance Coon Pre-anesthesia Checklist: Patient identified, Emergency Drugs available, Patient being monitored, Timeout performed and Suction available Patient Re-evaluated:Patient Re-evaluated prior to inductionOxygen Delivery Method: Circle system utilized Preoxygenation: Pre-oxygenation with 100% oxygen Intubation Type: IV induction Ventilation: Mask ventilation without difficulty Laryngoscope Size: Miller and 3 Grade View: Grade I Tube type: Oral Tube size: 7.5 mm Number of attempts: 1 Airway Equipment and Method: Stylet Placement Confirmation: ETT inserted through vocal cords under direct vision,  positive ETCO2 and breath sounds checked- equal and bilateral Secured at: 21 cm Tube secured with: Tape Dental Injury: Teeth and Oropharynx as per pre-operative assessment

## 2017-03-05 NOTE — Transfer of Care (Signed)
Immediate Anesthesia Transfer of Care Note  Patient: Jacqueline Reilly  Procedure(s) Performed: Procedure(s): ANTERIOR APPROACH LEFT HIP HEMI ARTHROPLASTY (Left)  Patient Location: PACU  Anesthesia Type:General  Level of Consciousness: sedated and patient cooperative  Airway & Oxygen Therapy: Patient Spontanous Breathing and Patient connected to face mask oxygen  Post-op Assessment: Report given to RN and Post -op Vital signs reviewed and stable  Post vital signs: Reviewed and stable  Last Vitals:  Vitals:   03/05/17 1211 03/05/17 1811  BP: 99/71   Pulse: 66   Resp: 16   Temp: 36.7 C 36.6 C    Last Pain:  Vitals:   03/05/17 1211  TempSrc: Oral  PainSc: 0-No pain         Complications: No apparent anesthesia complications

## 2017-03-05 NOTE — Progress Notes (Signed)
[facility]  Mrs.  Cannady is a longstanding home Milrinone/HF pt with [facility] providing HHRN and [facility] services.   [facility] has been working with the HF Clinic, Dr. Haroldine Laws along with the patient's son and [facility] social work team over the last few months and most recently the last week for a transition of care to a more appropriate setting.  The patient's son had acknowledged the level of care needed by his mother and made verbal agreement with Dr. Merceda Elks Team to move from [place] to [place] to care for the patient and his father, however, as of last week I shared with [facility] RN in the home, he may not do that now. At this time, [facility] team has determined SNF placement is safest disposition for DC. [facility] team will not be resuming [place] services.  [facility] team will continue to support Milrinone if ordered at DC to SNF.  [facility] Infusion Coordinator will follow with Case Manager and CSW until DC to support transition as ordered.  If patient discharges after hours, please call [phone].   [name] 03/05/2017, 11:29 AM
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
## 2017-03-05 NOTE — Progress Notes (Signed)
Husband is very Geneticist, molecular.  Wants to wait until tomorrow until their son arrives. Dr. Erlinda Hong has explained things to patient and spouse.  Jacqueline Reilly understands that she needs surgery to fix her hip and agrees to it.  Drs. Ace Gins made aware.   DA
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
## 2017-03-05 NOTE — Progress Notes (Signed)
Patient does not remember speaking with physician this morning concerning surgery and will not consent for surgery.  See primary RN progress note.  Dr. Erlinda Hong called and notified of patient not consenting.  MD stated to transport patient to PACU and they will try to consent there.  Spoke with OR and notified.  Sanda Linger

## 2017-03-05 NOTE — Anesthesia Preprocedure Evaluation (Signed)
Anesthesia Evaluation  Patient identified by MRN, date of birth, ID band Patient awake    Reviewed: Allergy & Precautions, NPO status , Patient's Chart, lab work & pertinent test results  History of Anesthesia Complications Negative for: history of anesthetic complications  Airway Mallampati: II  TM Distance: >3 FB Neck ROM: Full    Dental  (+) Teeth Intact, Chipped,    Pulmonary neg pulmonary ROS,    breath sounds clear to auscultation       Cardiovascular hypertension, + CAD, + Past MI, + Peripheral Vascular Disease and +CHF   Rhythm:Regular     Neuro/Psych negative neurological ROS     GI/Hepatic negative GI ROS, Neg liver ROS,   Endo/Other    Renal/GU Renal disease     Musculoskeletal   Abdominal   Peds  Hematology  (+) anemia ,   Anesthesia Other Findings Home milrinone for chronic chf, eliquis 2 days ago  Reproductive/Obstetrics                             Anesthesia Physical Anesthesia Plan  ASA: IV  Anesthesia Plan: General   Post-op Pain Management:    Induction: Intravenous  Airway Management Planned: Oral ETT  Additional Equipment: Arterial line  Intra-op Plan:   Post-operative Plan: Extubation in OR  Informed Consent: I have reviewed the patients History and Physical, chart, labs and discussed the procedure including the risks, benefits and alternatives for the proposed anesthesia with the patient or authorized representative who has indicated his/her understanding and acceptance.   Dental advisory given  Plan Discussed with: CRNA and Surgeon  Anesthesia Plan Comments:         Anesthesia Quick Evaluation

## 2017-03-06 ENCOUNTER — Ambulatory Visit: Payer: Medicare Other | Admitting: Podiatry

## 2017-03-06 ENCOUNTER — Encounter (HOSPITAL_COMMUNITY): Payer: Self-pay | Admitting: Orthopaedic Surgery

## 2017-03-06 LAB — BASIC METABOLIC PANEL
Anion gap: 14 (ref 5–15)
BUN: 18 mg/dL (ref 6–20)
CO2: 26 mmol/L (ref 22–32)
Calcium: 8.3 mg/dL — ABNORMAL LOW (ref 8.9–10.3)
Chloride: 98 mmol/L — ABNORMAL LOW (ref 101–111)
Creatinine, Ser: 1.13 mg/dL — ABNORMAL HIGH (ref 0.44–1.00)
GFR calc Af Amer: 48 mL/min — ABNORMAL LOW (ref >60)
GFR calc non Af Amer: 41 mL/min — ABNORMAL LOW (ref >60)
Glucose, Bld: 162 mg/dL — ABNORMAL HIGH (ref 65–99)
Potassium: 3.1 mmol/L — ABNORMAL LOW (ref 3.5–5.1)
Sodium: 138 mmol/L (ref 135–145)

## 2017-03-06 LAB — CBC
HCT: 31.8 % — ABNORMAL LOW (ref 36.0–46.0)
Hemoglobin: 10.2 g/dL — ABNORMAL LOW (ref 12.0–15.0)
MCH: 28.6 pg (ref 26.0–34.0)
MCHC: 32.1 g/dL (ref 30.0–36.0)
MCV: 89.1 fL (ref 78.0–100.0)
Platelets: 200 10*3/uL (ref 150–400)
RBC: 3.57 MIL/uL — ABNORMAL LOW (ref 3.87–5.11)
RDW: 14.5 % (ref 11.5–15.5)
WBC: 8.6 10*3/uL (ref 4.0–10.5)

## 2017-03-06 MED ORDER — CHLORHEXIDINE GLUCONATE CLOTH 2 % EX PADS
6.0000 | MEDICATED_PAD | Freq: Every day | CUTANEOUS | Status: DC
  Administered 2017-03-06 – 2017-03-08 (×3): 6 via TOPICAL

## 2017-03-06 MED ORDER — MUPIROCIN 2 % EX OINT
1.0000 "application " | TOPICAL_OINTMENT | Freq: Two times a day (BID) | CUTANEOUS | Status: DC
  Administered 2017-03-06 – 2017-03-08 (×5): 1 via NASAL
  Filled 2017-03-06 (×2): qty 22

## 2017-03-06 MED ORDER — MUPIROCIN 2 % EX OINT: 1.0000 "application " | TOPICAL_OINTMENT | Freq: Two times a day (BID) | CUTANEOUS | Status: DC

## 2017-03-06 NOTE — Clinical Social Work Note (Signed)
Clinical Social Work Assessment  Patient Details  Name: Jacqueline Reilly MRN: [id number] Date of Birth: 12-24-1923  Date of referral:  03/06/17               Reason for consult:  Facility Placement                Permission sought to share information with:  Family Supports Permission granted to share information::  Yes, Release of Information Signed  Name::     Art therapist::     Relationship::  Spouse  Contact Information:  [phone]  Housing/Transportation Living arrangements for the past 2 months:    Source of Information:  Patient Patient Interpreter Needed:  None Criminal Activity/Legal Involvement Pertinent to Current Situation/Hospitalization:  No - Comment as needed Significant Relationships:  Spouse, Adult Children Lives with:  Spouse Do you feel safe going back to the place where you live?    Need for family participation in patient care:     Care giving concerns:  Pts spouse present at bedside during initial assessment.   Social Worker assessment / plan:  CSW spoke with pt at bedside to complete initial assessment. Pt lives home with spouse. Pt had a hard time understanding the SNF process. Pt kept talking about how much work and how difficult that process would be. CSW explained role in completing bed search and providing offers. Pt and pt's spouse prefer it be a facility near their residence. Pt's spouse stated he understood the need for SNF and it was their safest option right now. CSW will send referral to facilities near pt's residence and provide b/o once available.  Employment status:  Retired Forensic scientist:  Medicare PT Recommendations:  [place] / Referral to community resources:  [place]  Patient/Family's Response to care:  Pt had a hard time understanding CSW role. Pt denies any concern regarding pt care at this time.   Patient/Family's Understanding of and Emotional Response to Diagnosis, Current  Treatment, and Prognosis:  Pt not understanding regarding physical limitations. Pt did understand that if she went home she would be at risk for fall and potentially a readmissoin. Pt agreeable to SNF placement at d/c, at this time. Pt's responses emotionally appropriate during conversation with CSW. Pt denies any concern regarding treatment plan at this time. CSW will continue to provide support and facilitate d/c needs.   Emotional Assessment Appearance:  Appears stated age Attitude/Demeanor/Rapport:   (Patient was appropriate.) Affect (typically observed):  Accepting, Flat Orientation:  Oriented to Self, Oriented to Place, Oriented to Situation Alcohol / Substance use:  Not Applicable Psych involvement (Current and /or in the community):  No (Comment)  Discharge Needs  Concerns to be addressed:  Care Coordination Readmission within the last 30 days:  No Current discharge risk:  Dependent with Mobility Barriers to Discharge:  Continued Medical Work up   [employer], LCSW 03/06/2017, 11:38 AM

## 2017-03-06 NOTE — Anesthesia Postprocedure Evaluation (Addendum)
Anesthesia Post Note  Patient: Jacqueline Reilly  Procedure(s) Performed: Procedure(s) (LRB): ANTERIOR APPROACH LEFT HIP HEMI ARTHROPLASTY (Left)  Patient location during evaluation: PACU Anesthesia Type: General Level of consciousness: awake and patient cooperative Pain management: pain level controlled Vital Signs Assessment: post-procedure vital signs reviewed and stable Respiratory status: spontaneous breathing, nonlabored ventilation, respiratory function stable and patient connected to nasal cannula oxygen Cardiovascular status: blood pressure returned to baseline and stable Postop Assessment: no signs of nausea or vomiting Anesthetic complications: no       Last Vitals:  Vitals:   03/06/17 1050 03/06/17 1213  BP:  (!) 99/59  Pulse: 87 82  Resp:  18  Temp:  36.7 C    Last Pain:  Vitals:   03/06/17 1213  TempSrc: Oral  PainSc:                  Brodi Kari

## 2017-03-06 NOTE — Progress Notes (Signed)
   Subjective:  Patient reports pain as moderate.  No events.  Objective:   VITALS:   Vitals:   03/06/17 0548 03/06/17 0751 03/06/17 1050 03/06/17 1213  BP: 93/72 (!) 96/54  (!) 99/59  Pulse: 72 70 87 82  Resp: 15 18  18   Temp: 97.6 F (36.4 C) 98 F (36.7 C)  98.1 F (36.7 C)  TempSrc: Axillary Oral  Oral  SpO2: 100% 100% 91%   Weight: 96 lb 11.2 oz (43.9 kg)     Height:        Neurologically intact Neurovascular intact Sensation intact distally Intact pulses distally Dorsiflexion/Plantar flexion intact Incision: dressing C/D/I and no drainage No cellulitis present Compartment soft   Lab Results  Component Value Date   WBC 8.6 03/06/2017   HGB 10.2 (L) 03/06/2017   HCT 31.8 (L) 03/06/2017   MCV 89.1 03/06/2017   PLT 200 03/06/2017     Assessment/Plan:  1 Day Post-Op   - Expected postop acute blood loss anemia - will monitor for symptoms - Up with PT/OT - DVT ppx - SCDs, ambulation, eliquis - WBAT operative extremity, no hip precautions - Pain control - Discharge planning - needs SNF  Eduard Roux 03/06/2017, 12:32 PM [phone]

## 2017-03-06 NOTE — Evaluation (Addendum)
Physical Therapy Evaluation Patient Details Name: Jacqueline Reilly MRN: [id number] DOB: 1923/11/27 Today's Date: 03/06/2017   History of Present Illness  83 yo admitted s/p fall with left hip fx s/p hemiarthroplasty. PMhx: CHF, CAD, dementia, ICM, HTN, PVD, HLD  Clinical Impression  Pt with flat affect, unaware of fall and sx with decreased memory even within session needing constant cues and redirection to task and precautions. Pt lives with spouse who is very talkative and unable to provide sufficient care for pt at this time. Pt with decreased strength, balance, mobility, gait and function who is unable to recall precautions and transfer techniques who will benefit from acute therapy to maximize gait, strength and function to decrease burden of care and increase awareness of restrictions. Pt incontinent on arrival and unaware. REcommend abduction pillow with RN aware. Will continue to follow.   Addendum: after evaluation and documentation Dr.Xu paged that despite order set for posterior pt does not have precautions due to anterior approach performed. Pt and RN notified.  HR 87, sats 88-91% on RA    Follow Up Recommendations SNF;Supervision/Assistance - 24 hour    Equipment Recommendations  3in1 (PT)    Recommendations for Other Services       Precautions / Restrictions Precautions Precautions: Fall;Posterior Hip Precaution Booklet Issued: Yes (comment) Precaution Comments: pt unable to recall precautions from beginning to end of session Restrictions Weight Bearing Restrictions: Yes LLE Weight Bearing: Weight bearing as tolerated      Mobility  Bed Mobility Overal bed mobility: Needs Assistance Bed Mobility: Supine to Sit     Supine to sit: Mod assist;HOB elevated     General bed mobility comments: HOB 30 degrees with assist to move LLE to EOB and pivot fully with use of rail and cues for sequence. LLE blocked throughout to prevent internal rotation  Transfers Overall  transfer level: Needs assistance   Transfers: Sit to/from Stand Sit to Stand: Min assist         General transfer comment: cues for hand placement, sequence, safety and LLE positioning with foot blocked  Ambulation/Gait Ambulation/Gait assistance: Min assist Ambulation Distance (Feet): 20 Feet Assistive device: Rolling walker (2 wheeled) Gait Pattern/deviations: Step-to pattern;Decreased stride length;Trunk flexed   Gait velocity interpretation: Below normal speed for age/gender General Gait Details: cues for sequence, precautions and RW safety. Assist to direct RW with pt maintaining self too far into RW despite cues, limited by fatigue  Stairs            Wheelchair Mobility    Modified Rankin (Stroke Patients Only)       Balance Overall balance assessment: Needs assistance;History of Falls   Sitting balance-Leahy Scale: Fair       Standing balance-Leahy Scale: Poor                               Pertinent Vitals/Pain Pain Assessment: 0-10 Pain Score: 4  Pain Location: left hip Pain Descriptors / Indicators: Sore Pain Intervention(s): Limited activity within patient's tolerance;Repositioned;Monitored during session    Home Living Family/patient expects to be discharged to:: Private residence Living Arrangements: Spouse/significant other Available Help at Discharge: Family;Available 24 hours/day Type of Home: House Home Access: Stairs to enter   [employer] of Steps: 3 Home Layout: One level Home Equipment: Cane - single point;Walker - 2 wheels      Prior Function Level of Independence: Needs assistance   Gait / Transfers Assistance Needed: pt  states she walks and transfers on her own , cane at times  ADL's / Homemaking Assistance Needed: spouse does the cooking, she does not drive, sponge bathes        Hand Dominance        Extremity/Trunk Assessment   Upper Extremity Assessment Upper Extremity Assessment:  Generalized weakness    Lower Extremity Assessment Lower Extremity Assessment: Generalized weakness    Cervical / Trunk Assessment Cervical / Trunk Assessment: Kyphotic  Communication   Communication: HOH  Cognition Arousal/Alertness: Awake/alert Behavior During Therapy: WFL for tasks assessed/performed Overall Cognitive Status: Impaired/Different from baseline Area of Impairment: Orientation;Attention;Memory;Following commands;Safety/judgement                 Orientation Level: Disoriented to;Time Current Attention Level: Sustained Memory: Decreased short-term memory;Decreased recall of precautions Following Commands: Follows multi-step commands with increased time;Follows one step commands consistently Safety/Judgement: Decreased awareness of safety;Decreased awareness of deficits            General Comments      Exercises     Assessment/Plan    PT Assessment Patient needs continued PT services  PT Problem List Decreased strength;Decreased mobility;Decreased safety awareness;Decreased knowledge of precautions;Decreased activity tolerance;Decreased cognition;Decreased balance;Decreased knowledge of use of DME;Pain       PT Treatment Interventions Gait training;Therapeutic exercise;Patient/family education;DME instruction;Therapeutic activities;Cognitive remediation;Stair training;Balance training;Functional mobility training;Neuromuscular re-education    PT Goals (Current goals can be found in the Care Plan section)  Acute Rehab PT Goals Patient Stated Goal: return home PT Goal Formulation: With patient/family Time For Goal Achievement: 03/20/17 Potential to Achieve Goals: Fair    Frequency Min 3X/week   Barriers to discharge Decreased caregiver support      Co-evaluation               AM-PAC PT "6 Clicks" Daily Activity  Outcome Measure Difficulty turning over in bed (including adjusting bedclothes, sheets and blankets)?: Total Difficulty  moving from lying on back to sitting on the side of the bed? : Total Difficulty sitting down on and standing up from a chair with arms (e.g., wheelchair, bedside commode, etc,.)?: Total Help needed moving to and from a bed to chair (including a wheelchair)?: A Lot Help needed walking in hospital room?: A Little Help needed climbing 3-5 steps with a railing? : A Lot 6 Click Score: 10    End of Session Equipment Utilized During Treatment: Gait belt Activity Tolerance: Patient tolerated treatment well Patient left: in chair;with call bell/phone within reach;with chair alarm set;with family/visitor present Nurse Communication: Mobility status;Precautions;Weight bearing status PT Visit Diagnosis: Other abnormalities of gait and mobility (R26.89);Difficulty in walking, not elsewhere classified (R26.2);Pain;Muscle weakness (generalized) (M62.81) Pain - Right/Left: Left Pain - part of body: Hip    Time: 3354-5625 PT Time Calculation (min) (ACUTE ONLY): 28 min   Charges:   PT Evaluation $PT Eval Moderate Complexity: 1 Procedure PT Treatments $Therapeutic Activity: 8-22 mins   PT G Codes:        Elwyn Reach, PT [phone]   Will 03/06/2017, 10:55 AM

## 2017-03-06 NOTE — Evaluation (Signed)
Occupational Therapy Evaluation Patient Details Name: Jacqueline Reilly MRN: [id number] DOB: [birth year] Today's Date: 03/06/2017    History of Present Illness 83 yo admitted s/p fall with left hip fx s/p hemiarthroplasty, anterior approach. PMhx: CHF, CAD, dementia, ICM, HTN, PVD, HLD   Clinical Impression   Pt typically is able to perform ADL independently. She stands at the sink to sponge bathe. Pt presents with L hip pain, impaired balance and generalized weakness with baseline cognitive impairment. Pt requires set up to total assist for ADL. Will need post acute rehab in SNF prior to return home. Will follow.    Follow Up Recommendations  SNF;Supervision/Assistance - 24 hour    Equipment Recommendations   (defer to next venue)    Recommendations for Other Services       Precautions / Restrictions Precautions Precautions: Fall;Other (comment) (no hip precautions per MD) Precaution Booklet Issued: Yes (comment) Restrictions Weight Bearing Restrictions: Yes LLE Weight Bearing: Weight bearing as tolerated      Mobility Bed Mobility      General bed mobility comments: pt received in chair  Transfers Overall transfer level: Needs assistance Equipment used: Rolling walker (2 wheeled) Transfers: Sit to/from Stand Sit to Stand: Min assist         General transfer comment: cues for hand placement, assist to rise and gain balance    Balance Overall balance assessment: Needs assistance;History of Falls   Sitting balance-Leahy Scale: Fair       Standing balance-Leahy Scale: Poor                             ADL either performed or assessed with clinical judgement   ADL Overall ADL's : Needs assistance/impaired Eating/Feeding: Sitting;Set up   Grooming: Wash/dry face;Wash/dry hands;Brushing hair;Sitting;Set up   Upper Body Bathing: Minimal assistance;Sitting   Lower Body Bathing: Total assistance;Sit to/from stand   Upper Body Dressing : Minimal  assistance;Sitting   Lower Body Dressing: Total assistance;Sit to/from stand                       Vision Patient Visual Report: No change from baseline       Perception     Praxis      Pertinent Vitals/Pain Pain Assessment: Faces Pain Score: 4  Faces Pain Scale: Hurts little more Pain Location: left hip Pain Descriptors / Indicators: Sore Pain Intervention(s): Ice applied;Monitored during session;Repositioned     Hand Dominance Right   Extremity/Trunk Assessment Upper Extremity Assessment Upper Extremity Assessment: Generalized weakness   Lower Extremity Assessment Lower Extremity Assessment: Defer to PT evaluation   Cervical / Trunk Assessment Cervical / Trunk Assessment: Kyphotic   Communication Communication Communication: HOH   Cognition Arousal/Alertness: Awake/alert Behavior During Therapy: WFL for tasks assessed/performed Overall Cognitive Status: Impaired/Different from baseline Area of Impairment: Orientation;Attention;Memory;Following commands;Safety/judgement                 Orientation Level: Disoriented to;Time Current Attention Level: Sustained Memory: Decreased short-term memory;Decreased recall of precautions Following Commands: Follows multi-step commands with increased time;Follows one step commands consistently Safety/Judgement: Decreased awareness of safety;Decreased awareness of deficits     General Comments: pt likely with baseline cognitive deficits   General Comments       Exercises     Shoulder Instructions      Home Living Family/patient expects to be discharged to:: Private residence Living Arrangements: Spouse/significant other Available Help at Discharge: Family;Available 24  hours/day Type of Home: House Home Access: Stairs to enter [employer] of Steps: 3   Home Layout: One level     Bathroom Shower/Tub: Teacher, early years/pre: Standard     Home Equipment: Cane - single  point;Walker - 2 wheels          Prior Functioning/Environment Level of Independence: Needs assistance  Gait / Transfers Assistance Needed: pt states she walks and transfers on her own , cane at times ADL's / Homemaking Assistance Needed: spouse does the cooking, she does not drive, sponge bathes            OT Problem List: Decreased strength;Decreased activity tolerance;Impaired balance (sitting and/or standing);Decreased knowledge of use of DME or AE;Pain;Decreased cognition;Decreased safety awareness      OT Treatment/Interventions: Self-care/ADL training;DME and/or AE instruction;Patient/family education;Balance training;Therapeutic activities    OT Goals(Current goals can be found in the care plan section) Acute Rehab OT Goals Patient Stated Goal: return home OT Goal Formulation: With patient Time For Goal Achievement: 03/20/17 Potential to Achieve Goals: Good ADL Goals Pt Will Perform Grooming: with min guard assist;standing Pt Will Transfer to Toilet: with min guard assist;ambulating;bedside commode (over toilet) Pt Will Perform Toileting - Clothing Manipulation and hygiene: with min guard assist;sit to/from stand  OT Frequency: Min 2X/week   Barriers to D/C:            Co-evaluation              AM-PAC PT "6 Clicks" Daily Activity     Outcome Measure Help from another person eating meals?: A Little Help from another person taking care of personal grooming?: A Little Help from another person toileting, which includes using toliet, bedpan, or urinal?: A Lot Help from another person bathing (including washing, rinsing, drying)?: Total Help from another person to put on and taking off regular upper body clothing?: A Little Help from another person to put on and taking off regular lower body clothing?: Total 6 Click Score: 13   End of Session Equipment Utilized During Treatment: Gait belt;Rolling walker  Activity Tolerance: Patient tolerated treatment  well Patient left: in chair;with call bell/phone within reach;with chair alarm set;with family/visitor present  OT Visit Diagnosis: Unsteadiness on feet (R26.81);Pain;Other symptoms and signs involving cognitive function;History of falling (Z91.81) Pain - Right/Left: Left Pain - part of body: Hip                Time: 5697-9480 OT Time Calculation (min): 31 min Charges:  OT General Charges $OT Visit: 1 Procedure OT Evaluation $OT Eval Moderate Complexity: 1 Procedure G-Codes:     Malka So 03/06/2017, 1:04 PM  [phone]

## 2017-03-06 NOTE — NC FL2 (Signed)
[place] LEVEL OF CARE SCREENING TOOL     IDENTIFICATION  Patient Name: Jacqueline Reilly Birthdate: [birth year] Sex: female Admission Date (Current Location): 03/04/2017  [facility] and [place] Number:  Herbalist and Address:  [facility] [place], [place] [address] [place], [place] [address]      Provider Number: [id number]  Attending Physician Name and Address:  Velvet Bathe, MD  Relative Name and Phone Number:       Current Level of Care: Hospital Recommended Level of Care: [facility] Prior Approval Number:    Date Approved/Denied: 03/05/17 PASRR Number: [id number]  Discharge Plan: SNF    Current Diagnoses: Patient Active Problem List   Diagnosis Date Noted  . Malnutrition of moderate degree 03/05/2017  . Left displaced femoral neck fracture ([place]) 03/04/2017  . Leukocytosis 03/04/2017  . Hypoxia   . Gram-negative bacteremia 09/01/2015  . Chronic anemia 09/01/2015  . Persistent atrial fibrillation ([place])   . Heart failure ([place]) 08/31/2015  . Chronic systolic CHF (congestive heart failure) ([place]) 06/21/2015  . Atrial fibrillation ([place]) 08/10/2014  . Physical deconditioning 05/26/2014  . Acute respiratory failure with hypoxia ([place]) 05/08/2014  . NSTEMI, initial episode of care ([place]) 05/07/2014  . Atrial fibrillation with rapid ventricular response ([place]) 05/07/2014  . NSTEMI (non-ST elevated myocardial infarction) ([place]) 05/07/2014  . Abnormal chest x-ray 04/24/2014  . CHF (congestive heart failure) ([place]) 04/23/2014  . Acute on chronic combined systolic and diastolic congestive heart failure ([place]) 04/23/2014  . Chronic systolic heart failure ([place]) 11/29/2013  . AAA (abdominal aortic aneurysm) ([place]) 10/07/2013  . SOB (shortness of breath) 09/30/2013  . Coronary artery disease   . Ischemic dilated cardiomyopathy ([place])   . Hypertension     Orientation RESPIRATION BLADDER Height & Weight     Self, Place, Situation  O2 (Nasal Cannula;3L) Continent Weight: 96 lb 11.2 oz (43.9 kg) Height:  5' (152.4 cm)  BEHAVIORAL SYMPTOMS/MOOD NEUROLOGICAL BOWEL NUTRITION STATUS      Continent  (Please see d/c summary)  AMBULATORY STATUS COMMUNICATION OF NEEDS Skin   Limited Assist Verbally Surgical wounds (Closed incision left hip)                       Personal Care Assistance Level of Assistance  Bathing, Feeding, Dressing Bathing Assistance: Limited assistance Feeding assistance: Independent Dressing Assistance: Limited assistance     Functional Limitations Info  Sight, Hearing, Speech Sight Info: Adequate Hearing Info: Adequate Speech Info: Adequate    SPECIAL CARE FACTORS FREQUENCY  PT (By licensed PT), OT (By licensed OT)     PT Frequency: 3x/week OT Frequency: 3x/week            Contractures Contractures Info: Not present    Additional Factors Info  Code Status, Allergies Code Status Info: Partial Code Allergies Info: Codeine, Garlic           Current Medications (03/06/2017):  This is the current hospital active medication list Current Facility-Administered Medications  Medication Dose Route Frequency Provider Last Rate Last Dose  . 0.9 %  sodium chloride infusion   Intravenous Continuous Leandrew Koyanagi, MD 125 mL/hr at 03/06/17 0516    . acetaminophen (TYLENOL) tablet 650 mg  650 mg Oral Q6H PRN Leandrew Koyanagi, MD       Or  . acetaminophen (TYLENOL) suppository 650 mg  650 mg Rectal Q6H PRN Leandrew Koyanagi, MD      . alum &  mag hydroxide-simeth (MAALOX/MYLANTA) 200-200-20 MG/5ML suspension 30 mL  30 mL Oral Q4H PRN Leandrew Koyanagi, MD      . amiodarone (PACERONE) tablet 100 mg  100 mg Oral Daily Barton Dubois, MD   100 mg at 03/06/17 1013  . apixaban (ELIQUIS) tablet 2.5 mg  2.5 mg Oral BID Leandrew Koyanagi, MD      . busPIRone (BUSPAR) tablet 5 mg  5 mg Oral Daily PRN Barton Dubois, MD      . ceFAZolin (ANCEF) IVPB 2g/100 mL premix  2 g Intravenous Q6H Leandrew Koyanagi, MD 200 mL/hr at  03/06/17 0504 2 g at 03/06/17 0504  . Chlorhexidine Gluconate Cloth 2 % PADS 6 each  6 each Topical Daily Velvet Bathe, MD      . HYDROcodone-acetaminophen (NORCO/VICODIN) 5-325 MG per tablet 1-2 tablet  1-2 tablet Oral Q6H PRN Barton Dubois, MD   1 tablet at 03/05/17 0929  . lisinopril (PRINIVIL,ZESTRIL) tablet 2.5 mg  2.5 mg Oral Daily Barton Dubois, MD   2.5 mg at 03/06/17 1012  . menthol-cetylpyridinium (CEPACOL) lozenge 3 mg  1 lozenge Oral PRN Leandrew Koyanagi, MD       Or  . phenol (CHLORASEPTIC) mouth spray 1 spray  1 spray Mouth/Throat PRN Leandrew Koyanagi, MD      . methocarbamol (ROBAXIN) tablet 500 mg  500 mg Oral Q6H PRN Leandrew Koyanagi, MD       Or  . methocarbamol (ROBAXIN) 500 mg in dextrose 5 % 50 mL IVPB  500 mg Intravenous Q6H PRN Leandrew Koyanagi, MD      . metoCLOPramide (REGLAN) tablet 5-10 mg  5-10 mg Oral Q8H PRN Leandrew Koyanagi, MD   5 mg at 03/05/17 2256   Or  . metoCLOPramide (REGLAN) injection 5-10 mg  5-10 mg Intravenous Q8H PRN Leandrew Koyanagi, MD      . milrinone (PRIMACOR) 20 MG/100 ML (0.2 mg/mL) infusion  0.125 mcg/kg/min Intravenous Continuous Honor Loh, RPH 1.6 mL/hr at 03/06/17 1021 0.125 mcg/kg/min at 03/06/17 1021  . morphine 2 MG/ML injection 0.5 mg  0.5 mg Intravenous Q2H PRN Leandrew Koyanagi, MD      . morphine 4 MG/ML injection 0.52 mg  0.52 mg Intravenous Q2H PRN Barton Dubois, MD      . multivitamin with minerals tablet 1 tablet  1 tablet Oral Daily Barton Dubois, MD   1 tablet at 03/06/17 1013  . mupirocin ointment (BACTROBAN) 2 % 1 application  1 application Nasal BID Velvet Bathe, MD      . ondansetron [facility]) tablet 4 mg  4 mg Oral Q6H PRN Leandrew Koyanagi, MD       Or  . ondansetron [facility]) injection 4 mg  4 mg Intravenous Q6H PRN Leandrew Koyanagi, MD   4 mg at 03/05/17 2033  . oxyCODONE (Oxy IR/ROXICODONE) immediate release tablet 5-10 mg  5-10 mg Oral Q4H PRN Leandrew Koyanagi, MD      . senna-docusate (Senokot-S) tablet 1 tablet  1 tablet Oral QHS PRN  Barton Dubois, MD      . sodium chloride flush (NS) 0.9 % injection 10-40 mL  10-40 mL Intracatheter Q12H Barton Dubois, MD   10 mL at 03/05/17 2257  . sodium chloride flush (NS) 0.9 % injection 10-40 mL  10-40 mL Intracatheter PRN Barton Dubois, MD      . torsemide [facility]) tablet 40 mg  40 mg Oral BID Barton Dubois, MD  40 mg at 03/06/17 1013  . tranexamic acid (CYKLOKAPRON) 2,000 mg in sodium chloride 0.9 % 50 mL Topical Application  8,250 mg Topical Once Velvet Bathe, MD         Discharge Medications: Please see discharge summary for a list of discharge medications.  Relevant Imaging Results:  Relevant Lab Results:   Additional Information SSN: [id number]  Eileen Stanford, LCSW

## 2017-03-06 NOTE — Progress Notes (Addendum)
Advanced Heart Failure Rounding Note  PCP: Dr. [name] Primary Cardiologist: Dr. Haroldine Laws   Subjective:    Admitted with left subcapital femoral neck fracture. s/p prosthetic replacement for femoral neck fracture 03/05/17.   On home milrinone at 0.14mcg. Weight stable.   Feels well this morning, denies pain, SOB. Alert and oriented.     Objective:   Weight Range: 96 lb 11.2 oz (43.9 kg) Body mass index is 18.89 kg/m.   Vital Signs:   Temp:  [97.5 F (36.4 C)-98 F (36.7 C)] 97.6 F (36.4 C) (05/10 0548) Pulse Rate:  [37-87] 72 (05/10 0548) Resp:  [12-25] 15 (05/10 0548) BP: (85-141)/(51-95) 93/72 (05/10 0548) SpO2:  [95 %-100 %] 100 % (05/10 0548) Weight:  [96 lb 11.2 oz (43.9 kg)-99 lb 3.2 oz (45 kg)] 96 lb 11.2 oz (43.9 kg) (05/10 0548) Last BM Date:  (not since admit)  Weight change: Filed Weights   03/05/17 0445 03/05/17 1211 03/06/17 0548  Weight: 96 lb (43.5 kg) 99 lb 3.2 oz (45 kg) 96 lb 11.2 oz (43.9 kg)    Intake/Output:   Intake/Output Summary (Last 24 hours) at 03/06/17 0740 Last data filed at 03/06/17 0321  Gross per 24 hour  Intake           925.68 ml  Output              850 ml  Net            75.68 ml     Physical Exam: General: Elderly female, lying in bed. NAD HEENT: normal Neck: supple. No JVP . Carotids 2+ bilat; no bruits. No lymphadenopathy or thyromegaly appreciated. Cor: PMI laterally displaced. Regular rate & rhythm. No rubs, gallops or murmurs. Lungs: clear in all lobes bilaterally.  Abdomen: soft, nontender, nondistended. No hepatosplenomegaly. No bruits or masses. Good bowel sounds. Extremities: no cyanosis, clubbing, rash, edema. L hip bandage in place.  Neuro: alert & orientedx3, cranial nerves grossly intact. moves all 4 extremities w/o difficulty. Affect pleasant   Telemetry: NSR rates in the 70's.   Labs: CBC  Recent Labs  03/04/17 1235  03/05/17 0648 03/06/17 0524  WBC 18.3*  < > 9.4 8.6  NEUTROABS  16.4*  --   --   --   HGB 13.2  < > 11.6* 10.2*  HCT 39.8  < > 36.8 31.8*  MCV 88.8  < > 88.9 89.1  PLT 272  < > 221 200  < > = values in this interval not displayed. Basic Metabolic Panel  Recent Labs  03/05/17 0648 03/06/17 0524  NA 141 138  K 3.6 3.1*  CL 102 98*  CO2 29 26  GLUCOSE 90 162*  BUN 14 18  CREATININE 0.85 1.13*  CALCIUM 8.5* 8.3*   Liver Function Tests  Recent Labs  03/04/17 1235  AST 26  ALT 18  ALKPHOS 89  BILITOT 0.7  PROT 7.4  ALBUMIN 4.2   No results for input(s): LIPASE, AMYLASE in the last 72 hours. Cardiac Enzymes  Recent Labs  03/04/17 1915 03/05/17 0129 03/05/17 0648  TROPONINI 0.23* 0.17* 0.12*    BNP: BNP (last 3 results)  Recent Labs  03/04/17 1235  BNP 69.6    Thyroid Function Tests  Recent Labs  03/04/17 1915  TSH 0.394         Imaging/Studies:  Pelvis Portable  Result Date: 03/05/2017 CLINICAL DATA:  S/P ANTERIOR APPROACH LEFT HIP HEMI ARTHROPLASTY (Left Hip) Included additional left hip  image to include entire hip. EXAM: PORTABLE PELVIS 1-2 VIEWS COMPARISON:  03/05/2017 portable exam FINDINGS: Status post hemiarthroplasty of the left hip. Surgical clips overlie the left hip in there is postoperative gas in the soft tissues. The femoral head component is well seated in the acetabular component based on the frontal projection. No interval fractures. Degenerative changes are seen in the lower lumbar spine and right hip. Regional bowel gas pattern is nonobstructive. IMPRESSION: Status post hemiarthroplasty of the left hip.  No adverse features. Electronically Signed   By: Nolon Nations M.D.   On: 03/05/2017 20:04   Dg C-arm 1-60 Min  Result Date: 03/05/2017 CLINICAL DATA:  Hemiarthroplasty of the left hip. [place] OR-2. Fluoro time: 9minutes, 12seconds. EXAM: OPERATIVE LEFT HIP (WITH PELVIS IF PERFORMED) 3 VIEWS TECHNIQUE: Fluoroscopic spot image(s) were submitted for interpretation post-operatively.  COMPARISON:  03/04/2017 FINDINGS: Initial image demonstrates subcapital femoral neck fracture. The patient has undergone hemiarthroplasty of the left hip. The femoral head component is well seated in the acetabulum. No new fractures identified. IMPRESSION: Left hip hemiarthroplasty for a subcapital femoral neck fracture. No adverse features Electronically Signed   By: Nolon Nations M.D.   On: 03/05/2017 19:50   Dg Hip Operative Unilat W Or W/o Pelvis Left  Result Date: 03/05/2017 CLINICAL DATA:  Hemiarthroplasty of the left hip. [place] OR-2. Fluoro time: 27minutes, 12seconds. EXAM: OPERATIVE LEFT HIP (WITH PELVIS IF PERFORMED) 3 VIEWS TECHNIQUE: Fluoroscopic spot image(s) were submitted for interpretation post-operatively. COMPARISON:  03/04/2017 FINDINGS: Initial image demonstrates subcapital femoral neck fracture. The patient has undergone hemiarthroplasty of the left hip. The femoral head component is well seated in the acetabulum. No new fractures identified. IMPRESSION: Left hip hemiarthroplasty for a subcapital femoral neck fracture. No adverse features Electronically Signed   By: Nolon Nations M.D.   On: 03/05/2017 19:50       Medications:     Scheduled Medications: . amiodarone  100 mg Oral Daily  . apixaban  2.5 mg Oral BID  . Chlorhexidine Gluconate Cloth  6 each Topical Daily  . lisinopril  2.5 mg Oral Daily  . multivitamin with minerals  1 tablet Oral Daily  . mupirocin ointment  1 application Nasal BID  . sodium chloride flush  10-40 mL Intracatheter Q12H  . torsemide  40 mg Oral BID     Infusions: . sodium chloride 125 mL/hr at 03/06/17 0516  .  ceFAZolin (ANCEF) IV 2 g (03/06/17 0504)  . methocarbamol (ROBAXIN)  IV    . milrinone 0.125 mcg/kg/min (03/05/17 2030)  . tranexamic acid (CYKLOKAPRON) topical -INTRAOP       PRN Medications:  acetaminophen **OR** acetaminophen, alum & mag hydroxide-simeth, busPIRone, HYDROcodone-acetaminophen,  menthol-cetylpyridinium **OR** phenol, methocarbamol **OR** methocarbamol (ROBAXIN)  IV, metoCLOPramide **OR** metoCLOPramide (REGLAN) injection, morphine injection, morphine injection, ondansetron **OR** ondansetron (ZOFRAN) IV, oxyCODONE, senna-docusate, sodium chloride flush   Assessment/Plan   1. Left Hip Fx - s/p repair on 03/05/17.  - stable.  2. Chronic end stage systolic CHF with home milrinone - Echo 11/2016 LVEF 30-35% - volume status stable.  - continue milrinone 0.149mcg.  - continue lisinopril 2.5mg   - continue torsemide 40mg  BID.  3. Paroxysmal Afib - In NSR today. - Continue Amiodarone.  - Eliquis to be resumed tonight. 4. CAD - Denies chest pain.  5. Anemia - Stable.  6. Short term memory loss 7. Limited Code - No CPR or Defib  Length of Stay: Y[place], NP  03/06/2017,  7:40 AM  Advanced Heart Failure Team Pager [phone] (M-F; 7a - 4p)  Please contact [facility] for night-coverage after hours (4p -7a ) and weekends on [email or web address]  Patient seen and examined with Jettie Booze, NP. We discussed all aspects of the encounter. I agree with the assessment and plan as stated above.   She tolerated her hip surgery well. No apparent HF on exam. Remains in NSR. Continue low-dose milrinone. Stop IVF.  Glori Bickers, MD  9:31 PM

## 2017-03-06 NOTE — Progress Notes (Signed)
PROGRESS NOTE    Jacqueline Reilly  MGQ:[id number] DOB: 12/05/1923 DOA: 03/04/2017 PCP: [name], MD    Brief Narrative:  83 year old female with a past medical history significant of atrial fibrillation (chronically on liquids), ischemic cardiomyopathy with chronic systolic heart failure (ejection fraction 30-35% and is chronically on milrinone), hypertension and hyperlipidemia; who presented to the hospital secondary to left hip. Patient reports experiencing a mechanical fall this morning and landed on her left side.   Further evaluation revealed: acute left hip fracture  Assessment & Plan:   Principal Problem:   Closed left hip fracture, initial encounter [facility]) - Orthopedic surgeon and managing. -Continue supportive therapy - Pt is pod 1 prosthetic replacement for femoral neck fracture  Active Problems:   Ischemic dilated cardiomyopathy (HCC)   Hypertension   Chronic systolic heart failure (HCC)    Persistent atrial fibrillation (HCC) - on amiodarone - cardiology following    Leukocytosis - Most likely reactive    Hypoxia - Continue supplemental oxygen    Malnutrition of moderate degree - Agree with dietitian findings. Once able to advance diet will also add nutritional supplementation    DVT prophylaxis: Defer to Ortho  Code Status: Partial, No cpr, yes intubation Family Communication: d/c spouse Disposition Plan: pending decision by patient   Consultants:   Cardiology  Orthopaedic surgery   Procedures: pending    Antimicrobials: None   Subjective: Pt has no new complaints. No acute issues overnight.  Objective: Vitals:   03/06/17 0548 03/06/17 0751 03/06/17 1050 03/06/17 1213  BP: 93/72 (!) 96/54  (!) 99/59  Pulse: 72 70 87 82  Resp: 15 18  18   Temp: 97.6 F (36.4 C) 98 F (36.7 C)  98.1 F (36.7 C)  TempSrc: Axillary Oral  Oral  SpO2: 100% 100% 91%   Weight: 43.9 kg (96 lb 11.2 oz)     Height:        Intake/Output Summary (Last 24  hours) at 03/06/17 1444 Last data filed at 03/06/17 0321  Gross per 24 hour  Intake           925.68 ml  Output              850 ml  Net            75.68 ml   Filed Weights   03/05/17 0445 03/05/17 1211 03/06/17 0548  Weight: 43.5 kg (96 lb) 45 kg (99 lb 3.2 oz) 43.9 kg (96 lb 11.2 oz)    Examination:  General exam: Appears calm and comfortable, in nad. Respiratory system: Clear to auscultation. Respiratory effort normal. Cardiovascular system: S1 & S2 heard, RRR Gastrointestinal system: Abdomen is nondistended, soft and nontender. No organomegaly or masses felt. Normal bowel sounds heard. Central nervous system: Alert and oriented. No facial asymmetry, responds to questions appropriately Extremities: warm and pink. Skin: No rashes, lesions or ulcers on limited exam. Psychiatry: Mood & affect appropriate.   Data Reviewed: I have personally reviewed following labs and imaging studies  CBC:  Recent Labs Lab 03/04/17 1235 03/04/17 1915 03/05/17 0648 03/06/17 0524  WBC 18.3* 10.8* 9.4 8.6  NEUTROABS 16.4*  --   --   --   HGB 13.2 12.4 11.6* 10.2*  HCT 39.8 38.3 36.8 31.8*  MCV 88.8 88.5 88.9 89.1  PLT 272 244 221 267   Basic Metabolic Panel:  Recent Labs Lab 03/04/17 1235 03/04/17 1915 03/05/17 0648 03/06/17 0524  NA 140  --  141 138  K 3.6  --  3.6 3.1*  CL 100*  --  102 98*  CO2 27  --  29 26  GLUCOSE 132*  --  90 162*  BUN 20  --  14 18  CREATININE 0.99 1.00 0.85 1.13*  CALCIUM 9.4  --  8.5* 8.3*   GFR: Estimated Creatinine Clearance: 22.5 mL/min (A) (by C-G formula based on SCr of 1.13 mg/dL (H)). Liver Function Tests:  Recent Labs Lab 03/04/17 1235  AST 26  ALT 18  ALKPHOS 89  BILITOT 0.7  PROT 7.4  ALBUMIN 4.2   No results for input(s): LIPASE, AMYLASE in the last 168 hours. No results for input(s): AMMONIA in the last 168 hours. Coagulation Profile:  Recent Labs Lab 03/04/17 1235  INR 1.10   Cardiac Enzymes:  Recent Labs Lab  03/04/17 1915 03/05/17 0129 03/05/17 0648  TROPONINI 0.23* 0.17* 0.12*   BNP (last 3 results) No results for input(s): PROBNP in the last 8760 hours. HbA1C: No results for input(s): HGBA1C in the last 72 hours. CBG: No results for input(s): GLUCAP in the last 168 hours. Lipid Profile: No results for input(s): CHOL, HDL, LDLCALC, TRIG, CHOLHDL, LDLDIRECT in the last 72 hours. Thyroid Function Tests:  Recent Labs  03/04/17 1915  TSH 0.394   Anemia Panel: No results for input(s): VITAMINB12, FOLATE, FERRITIN, TIBC, IRON, RETICCTPCT in the last 72 hours. Sepsis Labs: No results for input(s): PROCALCITON, LATICACIDVEN in the last 168 hours.  Recent Results (from the past 240 hour(s))  Surgical PCR screen     Status: Abnormal   Collection Time: 03/04/17  7:04 PM  Result Value Ref Range Status   MRSA, PCR NEGATIVE NEGATIVE Final   Staphylococcus aureus POSITIVE (A) NEGATIVE Final    Comment:        The Xpert SA Assay (FDA approved for NASAL specimens in patients over 65 years of age), is one component of a comprehensive surveillance program.  Test performance has been validated by [facility] for patients greater than or equal to 80 year old. It is not intended to diagnose infection nor to guide or monitor treatment.      Radiology Studies: Pelvis Portable  Result Date: 03/05/2017 CLINICAL DATA:  S/P ANTERIOR APPROACH LEFT HIP HEMI ARTHROPLASTY (Left Hip) Included additional left hip image to include entire hip. EXAM: PORTABLE PELVIS 1-2 VIEWS COMPARISON:  03/05/2017 portable exam FINDINGS: Status post hemiarthroplasty of the left hip. Surgical clips overlie the left hip in there is postoperative gas in the soft tissues. The femoral head component is well seated in the acetabular component based on the frontal projection. No interval fractures. Degenerative changes are seen in the lower lumbar spine and right hip. Regional bowel gas pattern is nonobstructive. IMPRESSION:  Status post hemiarthroplasty of the left hip.  No adverse features. Electronically Signed   By: Nolon Nations M.D.   On: 03/05/2017 20:04   Dg C-arm 1-60 Min  Result Date: 03/05/2017 CLINICAL DATA:  Hemiarthroplasty of the left hip. [place] OR-2. Fluoro time: 31minutes, 12seconds. EXAM: OPERATIVE LEFT HIP (WITH PELVIS IF PERFORMED) 3 VIEWS TECHNIQUE: Fluoroscopic spot image(s) were submitted for interpretation post-operatively. COMPARISON:  03/04/2017 FINDINGS: Initial image demonstrates subcapital femoral neck fracture. The patient has undergone hemiarthroplasty of the left hip. The femoral head component is well seated in the acetabulum. No new fractures identified. IMPRESSION: Left hip hemiarthroplasty for a subcapital femoral neck fracture. No adverse features Electronically Signed   By: Nolon Nations M.D.   On: 03/05/2017 19:50   Dg  Hip Operative Unilat W Or W/o Pelvis Left  Result Date: 03/05/2017 CLINICAL DATA:  Hemiarthroplasty of the left hip. [place] OR-2. Fluoro time: 31minutes, 12seconds. EXAM: OPERATIVE LEFT HIP (WITH PELVIS IF PERFORMED) 3 VIEWS TECHNIQUE: Fluoroscopic spot image(s) were submitted for interpretation post-operatively. COMPARISON:  03/04/2017 FINDINGS: Initial image demonstrates subcapital femoral neck fracture. The patient has undergone hemiarthroplasty of the left hip. The femoral head component is well seated in the acetabulum. No new fractures identified. IMPRESSION: Left hip hemiarthroplasty for a subcapital femoral neck fracture. No adverse features Electronically Signed   By: Nolon Nations M.D.   On: 03/05/2017 19:50    Scheduled Meds: . amiodarone  100 mg Oral Daily  . apixaban  2.5 mg Oral BID  . Chlorhexidine Gluconate Cloth  6 each Topical Daily  . lisinopril  2.5 mg Oral Daily  . multivitamin with minerals  1 tablet Oral Daily  . mupirocin ointment  1 application Nasal BID  . sodium chloride flush  10-40 mL Intracatheter Q12H  . torsemide  40 mg  Oral BID   Continuous Infusions: . sodium chloride 125 mL/hr at 03/06/17 0516  . methocarbamol (ROBAXIN)  IV    . milrinone 0.125 mcg/kg/min (03/06/17 1021)  . tranexamic acid (CYKLOKAPRON) topical -INTRAOP       LOS: 2 days    Time spent: > 35 minutes  Velvet Bathe, MD [facility] Pager [phone]  If 7PM-7AM, please contact night-coverage [email or web address] Password [email or web address] 03/06/2017, 2:44 PM

## 2017-03-06 NOTE — Care Management Note (Signed)
Case Management Note  Patient Details  Name: ELIETTE DRUMWRIGHT MRN: [id number] Date of Birth: June 29, 1924  Subjective/Objective: Pt presented for mechanical fall and left hip pain-Post Hip Hemiarthroplasty 03-05-17. Pt is from home with husband as caregiver. Pt was on IV Milrinone at home and was active with [facility]. [facility] will no longer provide services per previous CM note. PT/ OT recommendations for SNF.                   Action/Plan: CSW consulted and plan will be for SNF once stable. CM will continue to monitor.   Expected Discharge Date:                 Expected Discharge Plan:  Skilled Nursing Facility  In-House Referral:  Clinical Social Work  Discharge planning Services  CM Consult  Post Acute Care Choice:  NA Choice offered to:  NA  DME Arranged:  N/A DME Agency:  NA  HH Arranged:  NA HH Agency:  NA  Status of Service:  In process, will continue to follow  If discussed at Long Length of Stay Meetings, dates discussed:    Additional Comments:  Bethena Roys, RN 03/06/2017, 12:43 PM

## 2017-03-06 NOTE — Clinical Social Work Placement (Signed)
   CLINICAL SOCIAL WORK PLACEMENT  NOTE  Date:  03/06/2017  Patient Details  Name: Jacqueline Reilly MRN: [id number] Date of Birth: Nov 20, 1923  Clinical Social Work is seeking post-discharge placement for this patient at the [place] level of care (*CSW will initial, date and re-position this form in  chart as items are completed):      Patient/family provided with [facility] Work Department's list of facilities offering this level of care within the geographic area requested by the patient (or if unable, by the patient's family).      Patient/family informed of their freedom to choose among providers that offer the needed level of care, that participate in Medicare, Medicaid or managed care program needed by the patient, have an available bed and are willing to accept the patient.      Patient/family informed of [facility]'s ownership interest in [facility] and [facility], as well as of the fact that they are under no obligation to receive care at these facilities.  PASRR submitted to EDS on       PASRR number received on 03/06/17     Existing PASRR number confirmed on       FL2 transmitted to all facilities in geographic area requested by pt/family on 03/06/17     FL2 transmitted to all facilities within larger geographic area on       Patient informed that his/her managed care company has contracts with or will negotiate with certain facilities, including the following:        Yes   Patient/family informed of bed offers received.  Patient chooses bed at       Physician recommends and patient chooses bed at      Patient to be transferred to   on  .  Patient to be transferred to facility by [facility]     Patient family notified on   of transfer.  Name of family member notified:  ,Lawrence      PHYSICIAN Please prepare priority discharge summary, including medications, Please prepare prescriptions, Please sign FL2     Additional  Comment:    _______________________________________________ Eileen Stanford, LCSW 03/06/2017, 11:53 AM

## 2017-03-06 NOTE — Plan of Care (Signed)
Problem: Activity: Goal: Risk for activity intolerance will decrease Outcome: Progressing Pt ambulating with walker and minimal assist in room

## 2017-03-06 NOTE — Clinical Social Work Note (Addendum)
Crystal White from APS/ [facility] was visiting when patient was being transferred to another floor. She advised that she is involved with this patient and would like to be contacted by new CSW  for updates.  Crystal White can be reached [phone]. CSW will advise new CSW of same.

## 2017-03-07 ENCOUNTER — Other Ambulatory Visit (HOSPITAL_COMMUNITY): Payer: Self-pay | Admitting: Internal Medicine

## 2017-03-07 LAB — CBC
HCT: 29.4 % — ABNORMAL LOW (ref 36.0–46.0)
Hemoglobin: 9.5 g/dL — ABNORMAL LOW (ref 12.0–15.0)
MCH: 28.6 pg (ref 26.0–34.0)
MCHC: 32.3 g/dL (ref 30.0–36.0)
MCV: 88.6 fL (ref 78.0–100.0)
Platelets: 189 10*3/uL (ref 150–400)
RBC: 3.32 MIL/uL — ABNORMAL LOW (ref 3.87–5.11)
RDW: 14.4 % (ref 11.5–15.5)
WBC: 7.6 10*3/uL (ref 4.0–10.5)

## 2017-03-07 LAB — BASIC METABOLIC PANEL
Anion gap: 11 (ref 5–15)
BUN: 16 mg/dL (ref 6–20)
CO2: 30 mmol/L (ref 22–32)
Calcium: 8.4 mg/dL — ABNORMAL LOW (ref 8.9–10.3)
Chloride: 99 mmol/L — ABNORMAL LOW (ref 101–111)
Creatinine, Ser: 0.9 mg/dL (ref 0.44–1.00)
GFR calc Af Amer: >60 mL/min (ref >60)
GFR calc non Af Amer: 54 mL/min — ABNORMAL LOW (ref >60)
Glucose, Bld: 95 mg/dL (ref 65–99)
Potassium: 2.9 mmol/L — ABNORMAL LOW (ref 3.5–5.1)
Sodium: 140 mmol/L (ref 135–145)

## 2017-03-07 LAB — PHOSPHORUS: Phosphorus: 2.8 mg/dL (ref 2.5–4.6)

## 2017-03-07 LAB — MAGNESIUM: Magnesium: 2 mg/dL (ref 1.7–2.4)

## 2017-03-07 MED ORDER — MAGNESIUM SULFATE 2 GM/50ML IV SOLN
2.0000 g | Freq: Once | INTRAVENOUS | Status: AC
  Administered 2017-03-07: 2 g via INTRAVENOUS
  Filled 2017-03-07: qty 50

## 2017-03-07 MED ORDER — POTASSIUM CHLORIDE CRYS ER 20 MEQ PO TBCR
40.0000 meq | EXTENDED_RELEASE_TABLET | Freq: Two times a day (BID) | ORAL | Status: DC
  Administered 2017-03-07 – 2017-03-08 (×2): 40 meq via ORAL
  Filled 2017-03-07 (×2): qty 2

## 2017-03-07 MED ORDER — POTASSIUM CHLORIDE 10 MEQ/100ML IV SOLN
10.0000 meq | INTRAVENOUS | Status: AC
  Administered 2017-03-07 (×2): 10 meq via INTRAVENOUS
  Filled 2017-03-07 (×2): qty 100

## 2017-03-07 MED ORDER — ENSURE ENLIVE PO LIQD: 237.0000 mL | Freq: Three times a day (TID) | ORAL | Status: DC

## 2017-03-07 MED ORDER — ORAL CARE MOUTH RINSE
15.0000 mL | Freq: Two times a day (BID) | OROMUCOSAL | Status: DC
  Administered 2017-03-07: 15 mL via OROMUCOSAL

## 2017-03-07 MED ORDER — POTASSIUM CHLORIDE CRYS ER 20 MEQ PO TBCR
40.0000 meq | EXTENDED_RELEASE_TABLET | Freq: Once | ORAL | Status: AC
  Administered 2017-03-07: 40 meq via ORAL
  Filled 2017-03-07: qty 2

## 2017-03-07 NOTE — Progress Notes (Signed)
   Subjective:  Patient reports pain as moderate.  No events.  Objective:   VITALS:   Vitals:   03/06/17 1050 03/06/17 1213 03/06/17 1908 03/07/17 0500  BP:  (!) 99/59 105/71 103/61  Pulse: 87 82 75 67  Resp:  18 16 15   Temp:  98.1 F (36.7 C) 98.2 F (36.8 C) 98.1 F (36.7 C)  TempSrc:  Oral Oral Oral  SpO2: 91%  100% 96%  Weight:    105 lb 9.6 oz (47.9 kg)  Height:        Neurologically intact Neurovascular intact Sensation intact distally Intact pulses distally Dorsiflexion/Plantar flexion intact Incision: dressing C/D/I and no drainage No cellulitis present Compartment soft   Lab Results  Component Value Date   WBC 7.6 03/07/2017   HGB 9.5 (L) 03/07/2017   HCT 29.4 (L) 03/07/2017   MCV 88.6 03/07/2017   PLT 189 03/07/2017     Assessment/Plan:  2 Days Post-Op   - stable from ortho stand point for dc to SNF  Eduard Roux 03/07/2017, 7:39 AM [phone]

## 2017-03-07 NOTE — Care Management Important Message (Signed)
Important Message  Patient Details  Name: Jacqueline Reilly MRN: [id number] Date of Birth: Oct 02, 1924   Medicare Important Message Given:  Yes    Tionna Gigante Abena 03/07/2017, 1:12 PM

## 2017-03-07 NOTE — Progress Notes (Signed)
PROGRESS NOTE    Jacqueline Reilly  TMH:[id number] DOB: 23-Jun-1924 DOA: 03/04/2017 PCP: [name], MD    Brief Narrative:  83 year old female with a past medical history significant of atrial fibrillation (chronically on liquids), ischemic cardiomyopathy with chronic systolic heart failure (ejection fraction 30-35% and is chronically on milrinone), hypertension and hyperlipidemia; who presented to the hospital secondary to left hip. Patient reports experiencing a mechanical fall this morning and landed on her left side.   Further evaluation revealed: acute left hip fracture  Assessment & Plan:   Principal Problem:   Closed left hip fracture, initial encounter [facility]) - Orthopedic surgeon and managing. -Continue supportive therapy - Pt is pod 1 prosthetic replacement for femoral neck fracture  Active Problems:   Ischemic dilated cardiomyopathy (HCC)   Hypertension   Chronic systolic heart failure (HCC)  Hypokalemia - at 2.9. Will like to replace prior to discharging given how low it is. Currently has oral and IV replacement ordered.     Persistent atrial fibrillation (HCC) - on amiodarone - cardiology following    Leukocytosis - Most likely reactive    Hypoxia - Continue supplemental oxygen    Malnutrition of moderate degree - Agree with dietitian findings. Once able to advance diet will also add nutritional supplementation    DVT prophylaxis: Defer to Ortho  Code Status: Partial, No cpr, yes intubation Family Communication: attempted to reach son but got VM Disposition Plan: Once K levels normal or near normal, d/c most likely next am.   Consultants:   Cardiology  Orthopaedic surgery   Procedures: pending    Antimicrobials: None   Subjective: Pt has no new complaints.  Objective: Vitals:   03/06/17 1213 03/06/17 1908 03/07/17 0500 03/07/17 1444  BP: (!) 99/59 105/71 103/61 107/66  Pulse: 82 75 67 70  Resp: 18 16 15 17   Temp: 98.1 F (36.7 C) 98.2  F (36.8 C) 98.1 F (36.7 C) 98.2 F (36.8 C)  TempSrc: Oral Oral Oral Oral  SpO2:  100% 96% 95%  Weight:   47.9 kg (105 lb 9.6 oz)   Height:        Intake/Output Summary (Last 24 hours) at 03/07/17 1520 Last data filed at 03/07/17 0709  Gross per 24 hour  Intake           1272.2 ml  Output             1200 ml  Net             72.2 ml   Filed Weights   03/05/17 1211 03/06/17 0548 03/07/17 0500  Weight: 45 kg (99 lb 3.2 oz) 43.9 kg (96 lb 11.2 oz) 47.9 kg (105 lb 9.6 oz)    Examination:  General exam: Appears calm and comfortable, in nad. Respiratory system: Clear to auscultation. Respiratory effort normal. Cardiovascular system: S1 & S2 heard, RRR Gastrointestinal system: Abdomen is nondistended, soft and nontender. No organomegaly or masses felt. Normal bowel sounds heard. Central nervous system: Alert and oriented. No facial asymmetry, responds to questions appropriately Extremities: warm and pink. Skin: No rashes, lesions or ulcers on limited exam. Psychiatry: Mood & affect appropriate.   Data Reviewed: I have personally reviewed following labs and imaging studies  CBC:  Recent Labs Lab 03/04/17 1235 03/04/17 1915 03/05/17 0648 03/06/17 0524 03/07/17 0538  WBC 18.3* 10.8* 9.4 8.6 7.6  NEUTROABS 16.4*  --   --   --   --   HGB 13.2 12.4 11.6* 10.2* 9.5*  HCT  39.8 38.3 36.8 31.8* 29.4*  MCV 88.8 88.5 88.9 89.1 88.6  PLT 272 244 221 200 287   Basic Metabolic Panel:  Recent Labs Lab 03/04/17 1235 03/04/17 1915 03/05/17 0648 03/06/17 0524 03/07/17 0538 03/07/17 0700  NA 140  --  141 138 140  --   K 3.6  --  3.6 3.1* 2.9*  --   CL 100*  --  102 98* 99*  --   CO2 27  --  29 26 30   --   GLUCOSE 132*  --  90 162* 95  --   BUN 20  --  14 18 16   --   CREATININE 0.99 1.00 0.85 1.13* 0.90  --   CALCIUM 9.4  --  8.5* 8.3* 8.4*  --   MG  --   --   --   --   --  2.0  PHOS  --   --   --   --   --  2.8   GFR: Estimated Creatinine Clearance: 29.2 mL/min (by C-G  formula based on SCr of 0.9 mg/dL). Liver Function Tests:  Recent Labs Lab 03/04/17 1235  AST 26  ALT 18  ALKPHOS 89  BILITOT 0.7  PROT 7.4  ALBUMIN 4.2   No results for input(s): LIPASE, AMYLASE in the last 168 hours. No results for input(s): AMMONIA in the last 168 hours. Coagulation Profile:  Recent Labs Lab 03/04/17 1235  INR 1.10   Cardiac Enzymes:  Recent Labs Lab 03/04/17 1915 03/05/17 0129 03/05/17 0648  TROPONINI 0.23* 0.17* 0.12*   BNP (last 3 results) No results for input(s): PROBNP in the last 8760 hours. HbA1C: No results for input(s): HGBA1C in the last 72 hours. CBG: No results for input(s): GLUCAP in the last 168 hours. Lipid Profile: No results for input(s): CHOL, HDL, LDLCALC, TRIG, CHOLHDL, LDLDIRECT in the last 72 hours. Thyroid Function Tests:  Recent Labs  03/04/17 1915  TSH 0.394   Anemia Panel: No results for input(s): VITAMINB12, FOLATE, FERRITIN, TIBC, IRON, RETICCTPCT in the last 72 hours. Sepsis Labs: No results for input(s): PROCALCITON, LATICACIDVEN in the last 168 hours.  Recent Results (from the past 240 hour(s))  Surgical PCR screen     Status: Abnormal   Collection Time: 03/04/17  7:04 PM  Result Value Ref Range Status   MRSA, PCR NEGATIVE NEGATIVE Final   Staphylococcus aureus POSITIVE (A) NEGATIVE Final    Comment:        The Xpert SA Assay (FDA approved for NASAL specimens in patients over 76 years of age), is one component of a comprehensive surveillance program.  Test performance has been validated by [facility] for patients greater than or equal to 43 year old. It is not intended to diagnose infection nor to guide or monitor treatment.      Radiology Studies: Pelvis Portable  Result Date: 03/05/2017 CLINICAL DATA:  S/P ANTERIOR APPROACH LEFT HIP HEMI ARTHROPLASTY (Left Hip) Included additional left hip image to include entire hip. EXAM: PORTABLE PELVIS 1-2 VIEWS COMPARISON:  03/05/2017 portable exam  FINDINGS: Status post hemiarthroplasty of the left hip. Surgical clips overlie the left hip in there is postoperative gas in the soft tissues. The femoral head component is well seated in the acetabular component based on the frontal projection. No interval fractures. Degenerative changes are seen in the lower lumbar spine and right hip. Regional bowel gas pattern is nonobstructive. IMPRESSION: Status post hemiarthroplasty of the left hip.  No adverse features. Electronically Signed  By: Nolon Nations M.D.   On: 03/05/2017 20:04   Dg C-arm 1-60 Min  Result Date: 03/05/2017 CLINICAL DATA:  Hemiarthroplasty of the left hip. [place] OR-2. Fluoro time: 51minutes, 12seconds. EXAM: OPERATIVE LEFT HIP (WITH PELVIS IF PERFORMED) 3 VIEWS TECHNIQUE: Fluoroscopic spot image(s) were submitted for interpretation post-operatively. COMPARISON:  03/04/2017 FINDINGS: Initial image demonstrates subcapital femoral neck fracture. The patient has undergone hemiarthroplasty of the left hip. The femoral head component is well seated in the acetabulum. No new fractures identified. IMPRESSION: Left hip hemiarthroplasty for a subcapital femoral neck fracture. No adverse features Electronically Signed   By: Nolon Nations M.D.   On: 03/05/2017 19:50   Dg Hip Operative Unilat W Or W/o Pelvis Left  Result Date: 03/05/2017 CLINICAL DATA:  Hemiarthroplasty of the left hip. [place] OR-2. Fluoro time: 43minutes, 12seconds. EXAM: OPERATIVE LEFT HIP (WITH PELVIS IF PERFORMED) 3 VIEWS TECHNIQUE: Fluoroscopic spot image(s) were submitted for interpretation post-operatively. COMPARISON:  03/04/2017 FINDINGS: Initial image demonstrates subcapital femoral neck fracture. The patient has undergone hemiarthroplasty of the left hip. The femoral head component is well seated in the acetabulum. No new fractures identified. IMPRESSION: Left hip hemiarthroplasty for a subcapital femoral neck fracture. No adverse features Electronically Signed    By: Nolon Nations M.D.   On: 03/05/2017 19:50    Scheduled Meds: . amiodarone  100 mg Oral Daily  . apixaban  2.5 mg Oral BID  . Chlorhexidine Gluconate Cloth  6 each Topical Daily  . feeding supplement (ENSURE ENLIVE)  237 mL Oral TID BM  . lisinopril  2.5 mg Oral Daily  . mouth rinse  15 mL Mouth Rinse BID  . multivitamin with minerals  1 tablet Oral Daily  . mupirocin ointment  1 application Nasal BID  . potassium chloride  40 mEq Oral BID  . sodium chloride flush  10-40 mL Intracatheter Q12H  . torsemide  40 mg Oral BID   Continuous Infusions: . methocarbamol (ROBAXIN)  IV    . milrinone 0.125 mcg/kg/min (03/06/17 1021)  . tranexamic acid (CYKLOKAPRON) topical -INTRAOP       LOS: 3 days    Time spent: > 35 minutes  Velvet Bathe, MD [facility] Pager [phone]  If 7PM-7AM, please contact night-coverage [email or web address] Password [email or web address] 03/07/2017, 3:20 PM

## 2017-03-07 NOTE — Clinical Social Work Note (Signed)
CSW spoke with pt APS worker. At this time pt's spouse is her health care power of attorney. Pt is very confused this hospital stay. Pt states she is not going to rehab. However, pt's spouse says she will have to as he cannot care for her in her current physical states. Pt's son lives in [place]. Pt's husband has agreed to [employer]. Project d/c over the weekend.   [place], Delia

## 2017-03-07 NOTE — Progress Notes (Signed)
Advanced Heart Failure Rounding Note  PCP: Dr. [name] Primary Cardiologist: Dr. Haroldine Laws   Subjective:    Admitted with left subcapital femoral neck fracture. s/p prosthetic replacement for femoral neck fracture 03/05/17.   On home milrinone at 0.179mcg. Weight charted as up 9 pounds, likely inaccurate.   Feels well this morning, sitting up in bed eating breakfast. Plan is for her to go to a SNF.     Objective:   Weight Range: 105 lb 9.6 oz (47.9 kg) Body mass index is 20.62 kg/m.   Vital Signs:   Temp:  [98.1 F (36.7 C)-98.2 F (36.8 C)] 98.1 F (36.7 C) (05/11 0500) Pulse Rate:  [67-87] 67 (05/11 0500) Resp:  [15-18] 15 (05/11 0500) BP: (99-105)/(59-71) 103/61 (05/11 0500) SpO2:  [91 %-100 %] 96 % (05/11 0500) Weight:  [105 lb 9.6 oz (47.9 kg)] 105 lb 9.6 oz (47.9 kg) (05/11 0500) Last BM Date:  (not since admit)  Weight change: Filed Weights   03/05/17 1211 03/06/17 0548 03/07/17 0500  Weight: 99 lb 3.2 oz (45 kg) 96 lb 11.2 oz (43.9 kg) 105 lb 9.6 oz (47.9 kg)    Intake/Output:   Intake/Output Summary (Last 24 hours) at 03/07/17 0851 Last data filed at 03/07/17 0709  Gross per 24 hour  Intake           1332.2 ml  Output             1200 ml  Net            132.2 ml     Physical Exam: General:Elderly female, NAD HEENT: normal Neck: supple. JVD 5-6. Carotids 2+ bilat; no bruits. No thyromegaly or nodule noted. Cor: PMI laterally displaced. RRR, No M/G/R noted Lungs: CTAB, normal effort. Abdomen: soft, non-tender, distended, no HSM. No bruits or masses. +BS  Extremities: no cyanosis, clubbing, rash, R and LLE no edema. L hip bandage in place.  Neuro: alert & orientedx3, cranial nerves grossly intact. moves all 4 extremities w/o difficulty. Affect pleasant   Telemetry: NSR. Rates in the 70's.   Labs: CBC  Recent Labs  03/04/17 1235  03/06/17 0524 03/07/17 0538  WBC 18.3*  < > 8.6 7.6  NEUTROABS 16.4*  --   --   --   HGB 13.2  < >  10.2* 9.5*  HCT 39.8  < > 31.8* 29.4*  MCV 88.8  < > 89.1 88.6  PLT 272  < > 200 189  < > = values in this interval not displayed. Basic Metabolic Panel  Recent Labs  03/06/17 0524 03/07/17 0538 03/07/17 0700  NA 138 140  --   K 3.1* 2.9*  --   CL 98* 99*  --   CO2 26 30  --   GLUCOSE 162* 95  --   BUN 18 16  --   CREATININE 1.13* 0.90  --   CALCIUM 8.3* 8.4*  --   MG  --   --  2.0  PHOS  --   --  2.8   Liver Function Tests  Recent Labs  03/04/17 1235  AST 26  ALT 18  ALKPHOS 89  BILITOT 0.7  PROT 7.4  ALBUMIN 4.2   No results for input(s): LIPASE, AMYLASE in the last 72 hours. Cardiac Enzymes  Recent Labs  03/04/17 1915 03/05/17 0129 03/05/17 0648  TROPONINI 0.23* 0.17* 0.12*    BNP: BNP (last 3 results)  Recent Labs  03/04/17 1235  BNP 69.6  Thyroid Function Tests  Recent Labs  03/04/17 1915  TSH 0.394         Imaging/Studies:  No results found.    Medications:     Scheduled Medications: . amiodarone  100 mg Oral Daily  . apixaban  2.5 mg Oral BID  . Chlorhexidine Gluconate Cloth  6 each Topical Daily  . lisinopril  2.5 mg Oral Daily  . mouth rinse  15 mL Mouth Rinse BID  . multivitamin with minerals  1 tablet Oral Daily  . mupirocin ointment  1 application Nasal BID  . sodium chloride flush  10-40 mL Intracatheter Q12H  . torsemide  40 mg Oral BID    Infusions: . magnesium sulfate 1 - 4 g bolus IVPB    . methocarbamol (ROBAXIN)  IV    . milrinone 0.125 mcg/kg/min (03/06/17 1021)  . potassium chloride 10 mEq (03/07/17 0709)  . tranexamic acid (CYKLOKAPRON) topical -INTRAOP      PRN Medications: acetaminophen **OR** acetaminophen, alum & mag hydroxide-simeth, busPIRone, HYDROcodone-acetaminophen, menthol-cetylpyridinium **OR** phenol, methocarbamol **OR** methocarbamol (ROBAXIN)  IV, metoCLOPramide **OR** metoCLOPramide (REGLAN) injection, morphine injection, morphine injection, ondansetron **OR** ondansetron  (ZOFRAN) IV, oxyCODONE, senna-docusate, sodium chloride flush   Assessment/Plan   1. Left Hip Fx - s/p repair on 03/05/17.  - Stable, plan for SNF.  2. Chronic end stage systolic CHF with home milrinone - Echo 11/2016 LVEF 30-35% - Does not appear volume overloaded on exam.  - continue milrinone 0.119mcg.  - continue lisinopril 2.5mg   - continue torsemide 40mg  BID.  3. Paroxysmal Afib - In NSR today. - Continue Amiodarone.  - Eliquis resumed.  4. CAD - Denies chest pain.  - Continue current medical management 5. Anemia - stable.   - Follow CBC  6. Short term memory loss 7. Limited Code - No CPR or Defib 8. Hypokalemia:  - K 2.9 - Will give 14mEq BID today.    Length of Stay: [place], NP  03/07/2017, 8:51 AM  Advanced Heart Failure Team Pager [phone] (M-F; 7a - 4p)  Please contact [facility] for night-coverage after hours (4p -7a ) and weekends on [email or web address]  Patient seen and examined with Jettie Booze, NP. We discussed all aspects of the encounter. I agree with the assessment and plan as stated above.   Stable from HF perspective. Improving s/p hip fx. Supp Kcl.   Agree with plan for d/c to facility that can take milrinone. Discussed with family at bedside.   We will sign off. Please call with questions.   Glori Bickers, MD  4:43 PM

## 2017-03-07 NOTE — Progress Notes (Signed)
Per CSW notes patient and spouse agreeable to SNF (SNF would need to be able to manage Milrinone). Verified with Carolynn Sayers from [facility] that [facility] would be able to provide Milrinone for this specific patient through their pharmacy to the following SNFs: [place], [place], [facility]. CSW will facilitate SNF placement when patient medically stable for DC. Barrier today K 2.9.

## 2017-03-08 DIAGNOSIS — I5022 Chronic systolic (congestive) heart failure: Secondary | ICD-10-CM | POA: Diagnosis not present

## 2017-03-08 DIAGNOSIS — Z9181 History of falling: Secondary | ICD-10-CM | POA: Diagnosis not present

## 2017-03-08 DIAGNOSIS — I5020 Unspecified systolic (congestive) heart failure: Secondary | ICD-10-CM | POA: Diagnosis not present

## 2017-03-08 DIAGNOSIS — R41841 Cognitive communication deficit: Secondary | ICD-10-CM | POA: Diagnosis not present

## 2017-03-08 DIAGNOSIS — Z9889 Other specified postprocedural states: Secondary | ICD-10-CM | POA: Diagnosis not present

## 2017-03-08 DIAGNOSIS — I481 Persistent atrial fibrillation: Secondary | ICD-10-CM | POA: Diagnosis not present

## 2017-03-08 DIAGNOSIS — Z96642 Presence of left artificial hip joint: Secondary | ICD-10-CM | POA: Diagnosis not present

## 2017-03-08 DIAGNOSIS — R69 Illness, unspecified: Secondary | ICD-10-CM | POA: Diagnosis not present

## 2017-03-08 DIAGNOSIS — Z79899 Other long term (current) drug therapy: Secondary | ICD-10-CM | POA: Diagnosis not present

## 2017-03-08 DIAGNOSIS — J9690 Respiratory failure, unspecified, unspecified whether with hypoxia or hypercapnia: Secondary | ICD-10-CM | POA: Diagnosis not present

## 2017-03-08 DIAGNOSIS — Z452 Encounter for adjustment and management of vascular access device: Secondary | ICD-10-CM | POA: Diagnosis not present

## 2017-03-08 DIAGNOSIS — R5383 Other fatigue: Secondary | ICD-10-CM | POA: Diagnosis not present

## 2017-03-08 DIAGNOSIS — M6281 Muscle weakness (generalized): Secondary | ICD-10-CM | POA: Diagnosis not present

## 2017-03-08 DIAGNOSIS — Z955 Presence of coronary angioplasty implant and graft: Secondary | ICD-10-CM | POA: Diagnosis not present

## 2017-03-08 DIAGNOSIS — S72009A Fracture of unspecified part of neck of unspecified femur, initial encounter for closed fracture: Secondary | ICD-10-CM | POA: Diagnosis not present

## 2017-03-08 DIAGNOSIS — J9601 Acute respiratory failure with hypoxia: Secondary | ICD-10-CM | POA: Diagnosis not present

## 2017-03-08 DIAGNOSIS — E440 Moderate protein-calorie malnutrition: Secondary | ICD-10-CM | POA: Diagnosis not present

## 2017-03-08 DIAGNOSIS — I420 Dilated cardiomyopathy: Secondary | ICD-10-CM | POA: Diagnosis not present

## 2017-03-08 DIAGNOSIS — I700 Atherosclerosis of aorta: Secondary | ICD-10-CM | POA: Diagnosis not present

## 2017-03-08 DIAGNOSIS — I429 Cardiomyopathy, unspecified: Secondary | ICD-10-CM | POA: Diagnosis not present

## 2017-03-08 DIAGNOSIS — I4891 Unspecified atrial fibrillation: Secondary | ICD-10-CM | POA: Diagnosis not present

## 2017-03-08 DIAGNOSIS — I480 Paroxysmal atrial fibrillation: Secondary | ICD-10-CM | POA: Diagnosis not present

## 2017-03-08 DIAGNOSIS — S79911A Unspecified injury of right hip, initial encounter: Secondary | ICD-10-CM | POA: Diagnosis not present

## 2017-03-08 DIAGNOSIS — Z7901 Long term (current) use of anticoagulants: Secondary | ICD-10-CM | POA: Diagnosis not present

## 2017-03-08 DIAGNOSIS — I10 Essential (primary) hypertension: Secondary | ICD-10-CM | POA: Diagnosis not present

## 2017-03-08 DIAGNOSIS — E782 Mixed hyperlipidemia: Secondary | ICD-10-CM | POA: Diagnosis not present

## 2017-03-08 DIAGNOSIS — R2689 Other abnormalities of gait and mobility: Secondary | ICD-10-CM | POA: Diagnosis not present

## 2017-03-08 DIAGNOSIS — I5043 Acute on chronic combined systolic (congestive) and diastolic (congestive) heart failure: Secondary | ICD-10-CM | POA: Diagnosis not present

## 2017-03-08 DIAGNOSIS — I2510 Atherosclerotic heart disease of native coronary artery without angina pectoris: Secondary | ICD-10-CM | POA: Diagnosis not present

## 2017-03-08 DIAGNOSIS — I255 Ischemic cardiomyopathy: Secondary | ICD-10-CM | POA: Diagnosis not present

## 2017-03-08 DIAGNOSIS — I110 Hypertensive heart disease with heart failure: Secondary | ICD-10-CM | POA: Diagnosis not present

## 2017-03-08 DIAGNOSIS — Z451 Encounter for adjustment and management of infusion pump: Secondary | ICD-10-CM | POA: Diagnosis present

## 2017-03-08 DIAGNOSIS — R262 Difficulty in walking, not elsewhere classified: Secondary | ICD-10-CM | POA: Diagnosis not present

## 2017-03-08 DIAGNOSIS — S32302D Unspecified fracture of left ilium, subsequent encounter for fracture with routine healing: Secondary | ICD-10-CM | POA: Diagnosis not present

## 2017-03-08 DIAGNOSIS — S72002A Fracture of unspecified part of neck of left femur, initial encounter for closed fracture: Secondary | ICD-10-CM | POA: Diagnosis not present

## 2017-03-08 DIAGNOSIS — F0390 Unspecified dementia without behavioral disturbance: Secondary | ICD-10-CM | POA: Diagnosis not present

## 2017-03-08 LAB — CBC
HCT: 29.5 % — ABNORMAL LOW (ref 36.0–46.0)
Hemoglobin: 9.4 g/dL — ABNORMAL LOW (ref 12.0–15.0)
MCH: 28.5 pg (ref 26.0–34.0)
MCHC: 31.9 g/dL (ref 30.0–36.0)
MCV: 89.4 fL (ref 78.0–100.0)
Platelets: 220 10*3/uL (ref 150–400)
RBC: 3.3 MIL/uL — ABNORMAL LOW (ref 3.87–5.11)
RDW: 14.6 % (ref 11.5–15.5)
WBC: 7.9 10*3/uL (ref 4.0–10.5)

## 2017-03-08 LAB — BASIC METABOLIC PANEL
Anion gap: 7 (ref 5–15)
BUN: 14 mg/dL (ref 6–20)
CO2: 30 mmol/L (ref 22–32)
Calcium: 8.4 mg/dL — ABNORMAL LOW (ref 8.9–10.3)
Chloride: 101 mmol/L (ref 101–111)
Creatinine, Ser: 0.84 mg/dL (ref 0.44–1.00)
GFR calc Af Amer: >60 mL/min (ref >60)
GFR calc non Af Amer: 59 mL/min — ABNORMAL LOW (ref >60)
Glucose, Bld: 98 mg/dL (ref 65–99)
Potassium: 4.4 mmol/L (ref 3.5–5.1)
Sodium: 138 mmol/L (ref 135–145)

## 2017-03-08 MED ORDER — ENSURE ENLIVE PO LIQD: 237.0000 mL | Freq: Three times a day (TID) | ORAL | 12 refills | Status: DC

## 2017-03-08 NOTE — Progress Notes (Signed)
Report called and given to [facility]. [facility] came in and set pt up to her to her home milrinone. Husband at bedside.

## 2017-03-08 NOTE — Plan of Care (Signed)
Problem: Nutrition: Goal: Adequate nutrition will be maintained Outcome: Not Progressing Pt refused Ensure. Pt has not had an appetite.

## 2017-03-08 NOTE — Discharge Summary (Signed)
Physician Discharge Summary  Jacqueline Reilly YIR:[id number] DOB: 14-Jul-1924 DOA: 03/04/2017  PCP: [name], MD  Admit date: 03/04/2017 Discharge date: 03/08/2017  Time spent: > 35 minutes  Recommendations for Outpatient Follow-up:  1. Will continue prior to admission cardiac medication regimen. Please decide whether or not to continue Demadex. 2. Ensure patient has follow-up with orthopedic surgeon   Discharge Diagnoses:  Principal Problem:   Left displaced femoral neck fracture ([place]) Active Problems:   Ischemic dilated cardiomyopathy (HCC)   Hypertension   Chronic systolic heart failure (HCC)   Persistent atrial fibrillation (HCC)   Leukocytosis   Hypoxia   Malnutrition of moderate degree   Discharge Condition: Stable  Diet recommendation: Heart healthy  Filed Weights   03/07/17 0500 03/07/17 1914 03/08/17 0619  Weight: 47.9 kg (105 lb 9.6 oz) 42 kg (92 lb 8 oz) 42 kg (92 lb 8 oz)    History of present illness:   83 year old female with a past medical history significant of atrial fibrillation (chronically on liquids), ischemic cardiomyopathy with chronic systolic heart failure (ejection fraction 30-35% and is chronically on milrinone), hypertension and hyperlipidemia; who presented to the hospital secondary to left hip.  Was diagnosed with acute left hip fracture  Hospital Course:  Principal Problem:   Closed left hip fracture, initial encounter [facility]) - Orthopedic surgeon managed while in house. - Pt is pod prosthetic replacement for femoral neck fracture - SNF for physical therapy  Active Problems:   Ischemic dilated cardiomyopathy (HCC)/ Hypertension/Chronic systolic heart failure ([place]) - For problems above continue prior to admission home medication regimen  Hypokalemia - resolved    Persistent atrial fibrillation ([place]) - on amiodarone - cardiology following    Leukocytosis - Most likely reactive    Hypoxia - Continue supplemental oxygen as  needed to keep so2 at or above 92%    Malnutrition of moderate degree - Agree with dietitian findings. Continue nutritional supplementation   Procedures:  Please see above  Consultations: Ortho Cardiology  Discharge Exam: Vitals:   03/08/17 0619 03/08/17 1000  BP: (!) 101/59 122/61  Pulse: 77   Resp: 15   Temp: 97.9 F (36.6 C)     General: Pt in nad, alert and awake Cardiovascular: rrr, no rubs Respiratory: no increased wob, no wheezes  Discharge Instructions   Discharge Instructions    Call MD for:  severe uncontrolled pain    Complete by:  As directed    Call MD for:  temperature >100.4    Complete by:  As directed    Diet - low sodium heart healthy    Complete by:  As directed    Discharge instructions    Complete by:  As directed    Please follow up with the orthopedic surgeon. Will continue your prior to admissions cardiac medication regimen. Of note monitor blood pressure for administering Demadex or any other medication which may affect blood pressures..   Increase activity slowly    Complete by:  As directed    Weight bearing as tolerated    Complete by:  As directed      Current Discharge Medication List    START taking these medications   Details  !! apixaban (ELIQUIS) 2.5 MG TABS tablet Take 1 tablet (2.5 mg total) by mouth 2 (two) times daily. Qty: 30 tablet, Refills: 0    feeding supplement, ENSURE ENLIVE, (ENSURE ENLIVE) LIQD Take 237 mLs by mouth 3 (three) times daily between meals. Qty: 237 mL, Refills: 12  HYDROcodone-acetaminophen (NORCO) 5-325 MG tablet Take 1-2 tablets by mouth every 6 (six) hours as needed. Qty: 90 tablet, Refills: 0     !! - Potential duplicate medications found. Please discuss with provider.    CONTINUE these medications which have NOT CHANGED   Details  !! apixaban (ELIQUIS) 2.5 MG TABS tablet Take 1 tablet (2.5 mg total) by mouth 2 (two) times daily. Qty: 180 tablet, Refills: 3    busPIRone (BUSPAR) 5 MG  tablet Take 1 tablet (5 mg total) by mouth daily as needed. Qty: 2 tablet, Refills: 0    lisinopril (PRINIVIL,ZESTRIL) 2.5 MG tablet TAKE ONE TABLET BY MOUTH ONE TIME DAILY Qty: 90 tablet, Refills: 3    Multiple Vitamin (MULTIVITAMIN) tablet Take 1 tablet by mouth daily. Qty: 30 tablet, Refills: 1    Omega-3 Fatty Acids (FISH OIL) 1000 MG CAPS Take 1 capsule by mouth daily.    PACERONE 200 MG tablet TAKE 0.5 TABLETS (100 MG TOTAL) BY MOUTH DAILY. Qty: 45 tablet, Refills: 1    sodium chloride 0.9 % SOLN with milrinone 1 MG/ML SOLN 200 mcg/mL Inject into the vein continuous. 0.125 mcg/kg/min    torsemide (DEMADEX) 20 MG tablet TAKE TWO TABLETS BY MOUTH TWICE DAILY Qty: 120 tablet, Refills: 3     !! - Potential duplicate medications found. Please discuss with provider.     Allergies  Allergen Reactions  . Codeine Nausea Only  . Garlic Nausea Only    Contact information for follow-up providers    Leandrew Koyanagi, MD Follow up in 2 week(s).   Specialty:  Orthopedic Surgery Why:  For suture removal, For wound re-check Contact information: [place] [address] [phone]        Jolaine Artist, MD Follow up on 03/21/2017.   Specialty:  Cardiology Why:  at 1120 am for post hospital follow up. Please bring all medications or a medication list to your visit. The code for parking is 6001. Contact information: [place] [address] [phone]            Contact information for after-discharge care    Destination    [facility] Follow up.   Specialty:  [place] information: [address] [place] [phone]                   The results of significant diagnostics from this hospitalization (including imaging, microbiology, ancillary and laboratory) are listed below for reference.    Significant Diagnostic  Studies: Dg Chest 2 View  Result Date: 03/04/2017 CLINICAL DATA:  83 year old female fell this morning. Left hip pain. Initial encounter. EXAM: CHEST  2 VIEW COMPARISON:  01/08/2016 chest x-ray. FINDINGS: Right PICC line tip mid to distal superior vena cava level. Cardiomegaly. Tortuous aorta. Central pulmonary vascular prominence without pulmonary edema or segmental consolidation. No obvious rib fracture or pneumothorax. Bilateral shoulder joint degenerative changes. Probable calcified fibroadenoma left breast. IMPRESSION: Cardiomegaly. Tortuous aorta. Central pulmonary vascular prominence without pulmonary edema. Bilateral shoulder joint degenerative changes. Electronically Signed   By: Genia Del M.D.   On: 03/04/2017 13:31   Ct Head Wo Contrast  Result Date: 03/04/2017 CLINICAL DATA:  83 year old female post fall (tripping over of electric cord). Denies head or neck discomfort. Initial encounter. EXAM: CT HEAD WITHOUT CONTRAST CT CERVICAL SPINE WITHOUT CONTRAST TECHNIQUE: Multidetector CT imaging of the head and cervical spine was performed following the  standard protocol without intravenous contrast. Multiplanar CT image reconstructions of the cervical spine were also generated. COMPARISON:  09/07/2015 head CT. 06/17/2014 head CT and cervical spine CT. FINDINGS: CT HEAD FINDINGS No intracranial hemorrhage or CT evidence of large acute infarct. Chronic microvascular changes. Atrophy without hydrocephalus. No intracranial mass lesion noted on this unenhanced exam. Vascular: Vascular calcifications Skull: No skull fracture Sinuses/Orbits: Banding right globe without acute abnormality. Visualized paranasal sinuses are clear. Other: Negative CT CERVICAL SPINE FINDINGS Alignment: Within normal limits. Skull base and vertebrae: No cervical spine fracture. Soft tissues and spinal canal: No abnormal prevertebral soft tissue swelling. Disc levels: Spur with mild narrowing ventral thecal sac C4-5 thru C6-7. Upper  chest: Negative for worrisome abnormality. Other: Negative for worrisome abnormality. IMPRESSION: No skull fracture or intracranial hemorrhage. No cervical spine fracture or abnormal prevertebral soft tissue swelling. Electronically Signed   By: Genia Del M.D.   On: 03/04/2017 14:26   Ct Cervical Spine Wo Contrast  Result Date: 03/04/2017 CLINICAL DATA:  83 year old female post fall (tripping over of electric cord). Denies head or neck discomfort. Initial encounter. EXAM: CT HEAD WITHOUT CONTRAST CT CERVICAL SPINE WITHOUT CONTRAST TECHNIQUE: Multidetector CT imaging of the head and cervical spine was performed following the standard protocol without intravenous contrast. Multiplanar CT image reconstructions of the cervical spine were also generated. COMPARISON:  09/07/2015 head CT. 06/17/2014 head CT and cervical spine CT. FINDINGS: CT HEAD FINDINGS No intracranial hemorrhage or CT evidence of large acute infarct. Chronic microvascular changes. Atrophy without hydrocephalus. No intracranial mass lesion noted on this unenhanced exam. Vascular: Vascular calcifications Skull: No skull fracture Sinuses/Orbits: Banding right globe without acute abnormality. Visualized paranasal sinuses are clear. Other: Negative CT CERVICAL SPINE FINDINGS Alignment: Within normal limits. Skull base and vertebrae: No cervical spine fracture. Soft tissues and spinal canal: No abnormal prevertebral soft tissue swelling. Disc levels: Spur with mild narrowing ventral thecal sac C4-5 thru C6-7. Upper chest: Negative for worrisome abnormality. Other: Negative for worrisome abnormality. IMPRESSION: No skull fracture or intracranial hemorrhage. No cervical spine fracture or abnormal prevertebral soft tissue swelling. Electronically Signed   By: Genia Del M.D.   On: 03/04/2017 14:26   Pelvis Portable  Result Date: 03/05/2017 CLINICAL DATA:  S/P ANTERIOR APPROACH LEFT HIP HEMI ARTHROPLASTY (Left Hip) Included additional left hip image  to include entire hip. EXAM: PORTABLE PELVIS 1-2 VIEWS COMPARISON:  03/05/2017 portable exam FINDINGS: Status post hemiarthroplasty of the left hip. Surgical clips overlie the left hip in there is postoperative gas in the soft tissues. The femoral head component is well seated in the acetabular component based on the frontal projection. No interval fractures. Degenerative changes are seen in the lower lumbar spine and right hip. Regional bowel gas pattern is nonobstructive. IMPRESSION: Status post hemiarthroplasty of the left hip.  No adverse features. Electronically Signed   By: Nolon Nations M.D.   On: 03/05/2017 20:04   Dg C-arm 1-60 Min  Result Date: 03/05/2017 CLINICAL DATA:  Hemiarthroplasty of the left hip. [place] OR-2. Fluoro time: 8minutes, 12seconds. EXAM: OPERATIVE LEFT HIP (WITH PELVIS IF PERFORMED) 3 VIEWS TECHNIQUE: Fluoroscopic spot image(s) were submitted for interpretation post-operatively. COMPARISON:  03/04/2017 FINDINGS: Initial image demonstrates subcapital femoral neck fracture. The patient has undergone hemiarthroplasty of the left hip. The femoral head component is well seated in the acetabulum. No new fractures identified. IMPRESSION: Left hip hemiarthroplasty for a subcapital femoral neck fracture. No adverse features Electronically Signed   By: Everardo Beals.D.  On: 03/05/2017 19:50   Dg Hip Operative Unilat W Or W/o Pelvis Left  Result Date: 03/05/2017 CLINICAL DATA:  Hemiarthroplasty of the left hip. [place] OR-2. Fluoro time: 37minutes, 12seconds. EXAM: OPERATIVE LEFT HIP (WITH PELVIS IF PERFORMED) 3 VIEWS TECHNIQUE: Fluoroscopic spot image(s) were submitted for interpretation post-operatively. COMPARISON:  03/04/2017 FINDINGS: Initial image demonstrates subcapital femoral neck fracture. The patient has undergone hemiarthroplasty of the left hip. The femoral head component is well seated in the acetabulum. No new fractures identified. IMPRESSION: Left hip  hemiarthroplasty for a subcapital femoral neck fracture. No adverse features Electronically Signed   By: Nolon Nations M.D.   On: 03/05/2017 19:50   Dg Hip Unilat With Pelvis 2-3 Views Left  Result Date: 03/04/2017 CLINICAL DATA:  Fall, acute severe left hip pain EXAM: DG HIP (WITH OR WITHOUT PELVIS) 2-3V LEFT COMPARISON:  None available FINDINGS: There is an acute displaced and angulated left hip subcapital femoral neck fracture. Bones are osteopenic. Bony pelvis appears intact. Degenerative changes of the lower lumbar spine, SI joints, and both hips. Peripheral atherosclerosis evident. Normal bowel gas pattern. IMPRESSION: Acute left hip subcapital femoral neck fracture. Atherosclerosis Electronically Signed   By: Jerilynn Mages.  Shick M.D.   On: 03/04/2017 13:14   Dg Femur Min 2 Views Left  Result Date: 03/04/2017 CLINICAL DATA:  Status post fall.  Left hip pain. EXAM: LEFT FEMUR 2 VIEWS COMPARISON:  None. FINDINGS: Left hip joint is excluded from the field of view. Visualized portions of the left femur demonstrate no fracture or dislocation. Mild tricompartmental osteoarthritis of the left knee. Generalized osteopenia. No significant joint effusion. No aggressive lytic or sclerotic osseous lesion. Peripheral vascular atherosclerotic disease. No soft tissue abnormality. IMPRESSION: Left hip joint is excluded from the field of view and assessed on left hip x-rays performed same day. Visualized left femur demonstrates no acute fracture or dislocation. Electronically Signed   By: Kathreen Devoid   On: 03/04/2017 13:26    Microbiology: Recent Results (from the past 240 hour(s))  Surgical PCR screen     Status: Abnormal   Collection Time: 03/04/17  7:04 PM  Result Value Ref Range Status   MRSA, PCR NEGATIVE NEGATIVE Final   Staphylococcus aureus POSITIVE (A) NEGATIVE Final    Comment:        The Xpert SA Assay (FDA approved for NASAL specimens in patients over 36 years of age), is one component of a  comprehensive surveillance program.  Test performance has been validated by [facility] for patients greater than or equal to 63 year old. It is not intended to diagnose infection nor to guide or monitor treatment.      Labs: Basic Metabolic Panel:  Recent Labs Lab 03/04/17 1235 03/04/17 1915 03/05/17 5400 03/06/17 0524 03/07/17 0538 03/07/17 0700 03/08/17 0526  NA 140  --  141 138 140  --  138  K 3.6  --  3.6 3.1* 2.9*  --  4.4  CL 100*  --  102 98* 99*  --  101  CO2 27  --  29 26 30   --  30  GLUCOSE 132*  --  90 162* 95  --  98  BUN 20  --  14 18 16   --  14  CREATININE 0.99 1.00 0.85 1.13* 0.90  --  0.84  CALCIUM 9.4  --  8.5* 8.3* 8.4*  --  8.4*  MG  --   --   --   --   --  2.0  --  PHOS  --   --   --   --   --  2.8  --    Liver Function Tests:  Recent Labs Lab 03/04/17 1235  AST 26  ALT 18  ALKPHOS 89  BILITOT 0.7  PROT 7.4  ALBUMIN 4.2   No results for input(s): LIPASE, AMYLASE in the last 168 hours. No results for input(s): AMMONIA in the last 168 hours. CBC:  Recent Labs Lab 03/04/17 1235 03/04/17 1915 03/05/17 0648 03/06/17 0524 03/07/17 0538 03/08/17 0526  WBC 18.3* 10.8* 9.4 8.6 7.6 7.9  NEUTROABS 16.4*  --   --   --   --   --   HGB 13.2 12.4 11.6* 10.2* 9.5* 9.4*  HCT 39.8 38.3 36.8 31.8* 29.4* 29.5*  MCV 88.8 88.5 88.9 89.1 88.6 89.4  PLT 272 244 221 200 189 220   Cardiac Enzymes:  Recent Labs Lab 03/04/17 1915 03/05/17 0129 03/05/17 0648  TROPONINI 0.23* 0.17* 0.12*   BNP: BNP (last 3 results)  Recent Labs  03/04/17 1235  BNP 69.6    ProBNP (last 3 results) No results for input(s): PROBNP in the last 8760 hours.  CBG: No results for input(s): GLUCAP in the last 168 hours.   Signed:  Velvet Bathe MD.  [facility] 03/08/2017, 2:03 PM

## 2017-03-08 NOTE — Clinical Social Work Placement (Signed)
   CLINICAL SOCIAL WORK PLACEMENT  NOTE  Date:  03/08/2017  Patient Details  Name: Jacqueline Reilly MRN: [id number] Date of Birth: 02/18/1924  Clinical Social Work is seeking post-discharge placement for this patient at the [place] level of care (*CSW will initial, date and re-position this form in  chart as items are completed):  Yes   Patient/family provided with [facility] Work Department's list of facilities offering this level of care within the geographic area requested by the patient (or if unable, by the patient's family).  Yes   Patient/family informed of their freedom to choose among providers that offer the needed level of care, that participate in Medicare, Medicaid or managed care program needed by the patient, have an available bed and are willing to accept the patient.  Yes   Patient/family informed of [facility]'s ownership interest in [facility] and [facility], as well as of the fact that they are under no obligation to receive care at these facilities.  PASRR submitted to EDS on       PASRR number received on 03/06/17     Existing PASRR number confirmed on       FL2 transmitted to all facilities in geographic area requested by pt/family on 03/06/17     FL2 transmitted to all facilities within larger geographic area on       Patient informed that his/her managed care company has contracts with or will negotiate with certain facilities, including the following:        Yes   Patient/family informed of bed offers received.  Patient chooses bed at  [facility])     Physician recommends and patient chooses bed at      Patient to be transferred to  [facility]) on 03/08/17.  Patient to be transferred to facility by [facility]     Patient family notified on 03/08/17 of transfer.  Name of family member notified:  ,Lawrence      PHYSICIAN Please prepare priority discharge summary, including medications, Please prepare  prescriptions, Please sign FL2     Additional Comment:    _______________________________________________ Serafina Mitchell, [place] 03/08/2017, 3:29 PM

## 2017-03-08 NOTE — Clinical Social Work Note (Addendum)
Clinical Social Worker facilitated patient discharge including contacting patient family (Spouse at bedside) and facility ([facility]) to confirm patient discharge plans.  Clinical information faxed to facility and family agreeable with plan.CSW arranged ambulance transport via [facility] to [facility]. [facility] will hook up Milrinone prior to DC and maintain @ facility. RN to call report prior to discharge.  Clinical Social Worker will sign off for now as social work intervention is no longer needed. Please consult [place] again if new need arises.  Somya Jauregui B. Joline Maxcy Clinical Social Work Dept [employer] Social Worker [phone] 3:24 PM

## 2017-03-08 NOTE — Care Management Note (Signed)
Case Management Note  Patient Details  Name: Jacqueline Reilly MRN: [id number] Date of Birth: 1924/07/01  Subjective/Objective: 89 YOF to be discharged to [facility] today with IV Milrinone provided by [facility] (already arranged this week) Wilburn Cornelia, CSW is working with Gayla Medicus at [facility] who has requested admission be delayed until Monday which is NOT an option. Jermaine, rep with [facility] is coordinating this care and no further CM needs are needed at this time.                    Action/Plan:CM will sign off for now but will be available should additional discharge needs arise or disposition change.    Expected Discharge Date:   (unknown)               Expected Discharge Plan:  Skilled Nursing Facility  In-House Referral:  Clinical Social Work  Discharge planning Services  CM Consult  Post Acute Care Choice:  NA Choice offered to:  NA  DME Arranged:  N/A DME Agency:  NA  HH Arranged:  RN (Home Milrinone) [place] Agency:  NA  Status of Service:  Completed, signed off  If discussed at [place] of Stay Meetings, dates discussed:    Additional Comments:  Delrae Sawyers, RN 03/08/2017, 11:33 AM

## 2017-03-08 NOTE — Progress Notes (Signed)
[facility] called for transport to SNF [facility].  Jacqueline Reilly

## 2017-03-10 ENCOUNTER — Non-Acute Institutional Stay (SKILLED_NURSING_FACILITY): Payer: Medicare Other | Admitting: Adult Health

## 2017-03-10 DIAGNOSIS — S72002A Fracture of unspecified part of neck of left femur, initial encounter for closed fracture: Secondary | ICD-10-CM | POA: Diagnosis not present

## 2017-03-10 DIAGNOSIS — I10 Essential (primary) hypertension: Secondary | ICD-10-CM | POA: Diagnosis not present

## 2017-03-10 DIAGNOSIS — E440 Moderate protein-calorie malnutrition: Secondary | ICD-10-CM | POA: Diagnosis not present

## 2017-03-10 DIAGNOSIS — I4891 Unspecified atrial fibrillation: Secondary | ICD-10-CM

## 2017-03-10 DIAGNOSIS — I5043 Acute on chronic combined systolic (congestive) and diastolic (congestive) heart failure: Secondary | ICD-10-CM | POA: Diagnosis not present

## 2017-03-10 LAB — CBC AND DIFFERENTIAL
HCT: 36 % (ref 36–46)
Hemoglobin: 11.8 g/dL — AB (ref 12.0–16.0)
Neutrophils Absolute: 5 /uL
Platelets: 283 10*3/uL (ref 150–399)
WBC: 7.8 10^3/mL

## 2017-03-10 LAB — BASIC METABOLIC PANEL
BUN: 23 mg/dL — AB (ref 4–21)
Creatinine: 0.8 mg/dL (ref 0.5–1.1)
Glucose: 96 mg/dL
Potassium: 4.4 mmol/L (ref 3.4–5.3)
Sodium: 141 mmol/L (ref 137–147)

## 2017-03-10 LAB — HEPATIC FUNCTION PANEL
ALT: 5 U/L — AB (ref 7–35)
AST: 20 U/L (ref 13–35)
Alkaline Phosphatase: 63 U/L (ref 25–125)
Bilirubin, Total: 0.2 mg/dL

## 2017-03-10 NOTE — Progress Notes (Signed)
Location:   [facility] Room Number: 110 A Place of Service:  SNF (31)   CODE STATUS: Full Code  Allergies  Allergen Reactions  . Codeine Nausea Only  . Garlic Nausea Only    Chief Complaint  Patient presents with  . Hospitalization Follow-up    Hospital follow up    HPI:  She had been hospitalized after a fall at home; suffering a left femoral neck fracture. She is here for short term rehab. Her goal is to return back home as soon as she completes her rehab. I am not certain at this time if this will be possible.    Past Medical History:  Diagnosis Date  . AAA (abdominal aortic aneurysm) ([place])   . Atrial fibrillation ([place])   . CHF (congestive heart failure) ([place])   . Chronic systolic heart failure ([place])    a. ECHO (04/2014) EF 20-25%, diff HK, mild MR  . Coronary artery disease 2007   moderate ASCAD of the left system s/p PCI of the D2 and PCI of the left circ 03/2009  . Hyperlipidemia   . Hypertension   . PVD (peripheral vascular disease) ([place])   . Renal artery stenosis ([place])   . Ventricular dysfunction    left; ischemic    Past Surgical History:  Procedure Laterality Date  . ANGIOPLASTY    . ANTERIOR APPROACH HEMI HIP ARTHROPLASTY Left 03/05/2017   Procedure: ANTERIOR APPROACH LEFT HIP HEMI ARTHROPLASTY;  Surgeon: Leandrew Koyanagi, MD;  Location: [facility];  Service: Orthopedics;  Laterality: Left;  . CORONARY STENT PLACEMENT    . IR GENERIC HISTORICAL  09/25/2016   IR FLUORO GUIDE CV LINE RIGHT 09/25/2016 Ardis Rowan, PA-C [facility]  . retinal cryopexy     right eye (for retinal detachment)    Social History   Social History  . Marital status: Married    Spouse name: N/A  . Number of children: 1  . Years of education: N/A   Occupational History  . retired    Social History Main Topics  . Smoking status: Never Smoker  . Smokeless tobacco: Never Used  . Alcohol use Yes     Comment: occasionally  . Drug use: No  . Sexual activity:  Not Currently   Other Topics Concern  . Not on file   Social History Narrative   [place], family helps with her care.   Family History  Problem Relation Age of Onset  . Heart disease Mother   . Heart disease Father   . Heart disease Brother        x 3      VITAL SIGNS BP 106/60   Pulse 78   Temp 98 F (36.7 C)   Resp 20   Ht 5' (1.524 m)   Wt 93 lb (42.2 kg)   BMI 18.16 kg/m   Patient's Medications  New Prescriptions   No medications on file  Previous Medications   AMIODARONE (PACERONE) 100 MG TABLET    Take 100 mg by mouth daily.   APIXABAN (ELIQUIS) 2.5 MG TABS TABLET    Take 1 tablet (2.5 mg total) by mouth 2 (two) times daily.   BUSPIRONE (BUSPAR) 5 MG TABLET    Take 1 tablet (5 mg total) by mouth daily as needed.   FEEDING SUPPLEMENT, ENSURE ENLIVE, (ENSURE ENLIVE) LIQD    Take 237 mLs by mouth 3 (three) times daily between meals.   HYDROCODONE-ACETAMINOPHEN (NORCO) 5-325 MG TABLET  Take 1-2 tablets by mouth every 6 (six) hours as needed.   LISINOPRIL (PRINIVIL,ZESTRIL) 2.5 MG TABLET    TAKE ONE TABLET BY MOUTH ONE TIME DAILY   MULTIPLE VITAMIN (MULTIVITAMIN) TABLET    Take 1 tablet by mouth daily.   OMEGA-3 FATTY ACIDS (FISH OIL) 1000 MG CAPS    Take 1 capsule by mouth daily.   SODIUM CHLORIDE 0.9 % SOLN WITH MILRINONE 1 MG/ML SOLN 200 MCG/ML    Inject 0.125 mcg/kg/min into the vein continuous.   TORSEMIDE (DEMADEX) 20 MG TABLET    TAKE TWO TABLETS BY MOUTH TWICE DAILY  Modified Medications   No medications on file  Discontinued Medications     SIGNIFICANT DIAGNOSTIC EXAMS  12-25-16: TEE: - Left ventricle: The cavity size was normal. Wall thickness was normal. Systolic function was moderately to severely reduced. The estimated ejection fraction was in the range of 30% to 35%. Doppler parameters are consistent with abnormal left ventricular relaxation (grade 1 diastolic dysfunction). - Aortic valve: Mildly calcified annulus. There was very  mild stenosis. There was trivial regurgitation.   03-04-17: pelvic x-ray: Acute left hip subcapital femoral neck fracture. Atherosclerosis  03-04-17: left femur x-ray: Left hip joint is excluded from the field of view and assessed of left hip x-rays performed same day. Visualized left femur demonstrates no acute fracture or dislocation.   03-04-17: chest x-ray: Cardiomegaly. Tortuous aorta. Central pulmonary vascular prominence without pulmonary edema. Bilateral shoulder joint degenerative changes.   03-04-17: ct of head and cervical spine: No skull fracture or intracranial hemorrhage. No cervical spine fracture or abnormal prevertebral soft tissue swelling.  LABS REVIEWED:   03-04-17: wbc 18.3; hgb 13.2; hct 39.8 ;mcv 88.8; plt 272; glucose 132; bun 20; creat 0.99; k+ 3.6; na++ 140; liver normal albumin 4.2; BNP 69.6; tsh 0.394 03-06-17; wbc 8.6; hgb 10.2; hct 31.8; mcv 89.1; plt 200; glucose 162; bun 18; creat 1.13; k+ 3.1; na++ 138 03-08-17: wbc 7.9; hgb 9.4; hct 29.5; mcv 89.4; plt 220; glucose 98; bun 14; creat 0.84; k+ 4.4; na++ 138   Review of Systems  Constitutional: Negative for malaise/fatigue.  Respiratory: Negative for cough and shortness of breath.   Cardiovascular: Negative for chest pain, palpitations and leg swelling.  Gastrointestinal: Negative for abdominal pain, constipation and heartburn.  Musculoskeletal: Positive for joint pain. Negative for back pain and myalgias.       Has left hip pain   Skin: Negative.   Neurological: Negative for dizziness.  Psychiatric/Behavioral: The patient is not nervous/anxious.     Physical Exam  Constitutional: No distress.  Frail   Eyes: Conjunctivae are normal.  Neck: Neck supple. No JVD present. No thyromegaly present.  Cardiovascular: Normal rate, regular rhythm and intact distal pulses.   Respiratory: Effort normal and breath sounds normal. No respiratory distress. She has no wheezes.  GI: Soft. Bowel sounds are normal. She exhibits  no distension. There is no tenderness.  Musculoskeletal: She exhibits no edema.  Able to move all extremities  Is status post left femoral head fracture   Lymphadenopathy:    She has no cervical adenopathy.  Neurological: She is alert.  Skin: Skin is warm and dry. She is not diaphoretic.  Incisional line without signs of infection present.  picc line right arm   Psychiatric: She has a normal mood and affect.     ASSESSMENT/ PLAN:  1. Chronic diastolic/systolic heart failure :has ischemic dilated cardiomyopathy:  EF 30-35% (12-25-16): will continue demadex 40 mg twice daily is on  chronic milrinone drip at 0.125 mcg/kg/min.   2. Chronic afib: heart rate stable; will continue eliquis 2.5 mg twice daily amiodarone 100 mg daily for rate control  3. Hypertension: b/p 106/60 will continue lisinopril 2.5 mg daily   4. Left displaced femoral neck fracture: will continue therapy as directed; will follow up with orthopedics as indicated; will continue vicodin 5/325 mg 1 or 2 tabs; every 6 hours as needed  5. Malnutrition: albumin 4.1; will continue supplements per facility protocol  Will need to follow up with orthopedics in 2 weeks Will need to follow up  With Dr. Haroldine Laws on 03-24-17    Time spent with patient  50   minutes >50% time spent counseling; reviewing medical record; tests; labs; and developing future plan of care   MD is aware of resident's narcotic use and is in agreement with current plan of care. We will attempt to wean resident as apropriate   Ok Edwards NP [facility]  Contact [phone] Monday through Friday 8am- 5pm  After hours call [phone]

## 2017-03-10 NOTE — Progress Notes (Signed)
Spoke with Pam RN from [facility] stating patient did not come there with IV milrinone pump attached.  Spoke with Ebony Hail RN on Saturday day shift and she remembers IV Milrinone home pump being attached by [facility].  I remember seeing fanny-pack attached to patient Saturday afternoon after [facility] called for pickup.  NT-Christinia assisted patient to restroom around 1830 and saw fanny-pack with home IV milrinone attached to patient.  Night shift RN-Karen stated home milrinone pump was attached to patient and she [place] Glad) did not disconnect prior to transport.  Upon discharge, NT/Sec Chrisitina stated when environmental came to clean room, they found home pump in room.  Home pump found at front desk this morning in [place] box.  Pam from [facility] notified and updated.  Sanda Linger

## 2017-03-11 ENCOUNTER — Encounter: Payer: Self-pay | Admitting: Internal Medicine

## 2017-03-11 ENCOUNTER — Non-Acute Institutional Stay (SKILLED_NURSING_FACILITY): Payer: Medicare Other | Admitting: Internal Medicine

## 2017-03-11 DIAGNOSIS — I481 Persistent atrial fibrillation: Secondary | ICD-10-CM

## 2017-03-11 DIAGNOSIS — R2689 Other abnormalities of gait and mobility: Secondary | ICD-10-CM | POA: Diagnosis not present

## 2017-03-11 DIAGNOSIS — I5022 Chronic systolic (congestive) heart failure: Secondary | ICD-10-CM | POA: Diagnosis not present

## 2017-03-11 DIAGNOSIS — Z9181 History of falling: Secondary | ICD-10-CM | POA: Diagnosis not present

## 2017-03-11 DIAGNOSIS — E782 Mixed hyperlipidemia: Secondary | ICD-10-CM | POA: Diagnosis not present

## 2017-03-11 DIAGNOSIS — I10 Essential (primary) hypertension: Secondary | ICD-10-CM | POA: Diagnosis not present

## 2017-03-11 DIAGNOSIS — I420 Dilated cardiomyopathy: Secondary | ICD-10-CM

## 2017-03-11 DIAGNOSIS — I4819 Other persistent atrial fibrillation: Secondary | ICD-10-CM

## 2017-03-11 DIAGNOSIS — Z9889 Other specified postprocedural states: Secondary | ICD-10-CM | POA: Diagnosis not present

## 2017-03-11 NOTE — Progress Notes (Signed)
Patient ID: Jacqueline Reilly, female   DOB: 09-Sep-1924, 83 y.o.   MRN: [id number]    HISTORY AND PHYSICAL   DATE: 03/11/2017  Location:    [facility] Room Number: 110 A Place of Service: SNF (31)   Extended Emergency Contact Information Primary Emergency Contact: Conan Bowens Address: [address] [place] [address] [place] Phone: [phone] Mobile Phone: [phone] Relation: Son Secondary Emergency Contact: Darron Doom Address: [address]          [place], [place] [address] [place]nette Litter of [place] Phone: [phone] Mobile Phone: [phone] Relation: Spouse  Advanced Directive information Does Patient Have a Medical Advance Directive?: No  Chief Complaint  Patient presents with  . New Admit To SNF    Admission    HPI:  83 yo female seen today as a new admission into SNF following hospital stay for left femoral neck displaced fx s/p fall, ischemic DCM with EF 30-35%, HTN, chronic systolic HF, persistent afib on eliquis for anticoagulation, leukocytosis, hypoxia, hyperlipidemia. She presented to ED with left hip pain s/p fall. Imaging revealed closed left hip fx. She was taken to the OR on 03/05/17 by Ortho Dr Erlinda Hong for left femoral neck fx prosthetic replacement. Cardio followed for afib and HF. She experienced some hypoxia and was placed on prn [place] O2. Hgb 12.4-->9.4; WBC 18.3K-->10.8K-->7.9K; abs Neutrophils 16.4K; K dropped to 2.9-->4.4; albumin 4.2 at d/c. She presents to SNF for short term rehab.  Today she reports c/a not being able to reach her son who is traveling from [place] to [place] to see her. She reports no CP, SOB or palpitations. No f/c. No HA/dizziness. Appetite reduced. Sleep is interrupted. Per nursing, she was confused this AM but is better this PM. She has a milrinone gtt mx by cardio Dr Haroldine Laws. Pain in left hip controlled. She rarely takes opioid. Tolerating PT/OT. No falls since  admission to SNF.   Chronic systolic heart failure 2/2 ischemic and dilated cardiomyopathy/CAD s/p PCI - EF 30-35% (12-25-16). Takes demadex 40 mg twice daily; chronic milrinone drip at 0.125 mcg/kg/min via right arm PICC line. Followed by cardio Dr Haroldine Laws   Persistent Afib - rate controlled on amio 148m daily; takes eliquis 2.5 mg twice daily for anticoagulation. Followed by cardio  Hypertension - stable on lisinopril 2.5 mg daily. She is also on milrinone gtt and amio which may lower her BP  Hx moderate protein Malnutrition- improved. Albumin 4.2. She gets supplements per facility protocol  Hyperlipidemia - diet controlled. LDL 131 in 2015   Past Medical History:  Diagnosis Date  . AAA (abdominal aortic aneurysm) ([facility]   . Atrial fibrillation ([facility]   . CHF (congestive heart failure) ([facility]   . Chronic systolic heart failure ([facility]    a. ECHO (04/2014) EF 20-25%, diff HK, mild MR  . Coronary artery disease 2007   moderate ASCAD of the left system s/p PCI of the D2 and PCI of the left circ 03/2009  . Hyperlipidemia   . Hypertension   . PVD (peripheral vascular disease) ([facility]   . Renal artery stenosis ([facility]   . Ventricular dysfunction    left; ischemic    Past Surgical History:  Procedure Laterality Date  . ANGIOPLASTY    . ANTERIOR APPROACH HEMI HIP ARTHROPLASTY Left 03/05/2017   Procedure: ANTERIOR APPROACH LEFT HIP HEMI ARTHROPLASTY;  Surgeon: XLeandrew Koyanagi MD;  Location:  [facility];  Service: Orthopedics;  Laterality: Left;  . CORONARY STENT PLACEMENT    . IR GENERIC HISTORICAL  09/25/2016   IR FLUORO GUIDE CV LINE RIGHT 09/25/2016 Ardis Rowan, PA-C [facility]  . retinal cryopexy     right eye (for retinal detachment)    Patient Care Team: [name], MD as PCP - General (Internal Medicine) Geri [place] ([place])  Social History   Social History  . Marital status: Married    Spouse name: N/A  . Number of children: 1  . Years of education:  N/A   Occupational History  . retired    Social History Main Topics  . Smoking status: Never Smoker  . Smokeless tobacco: Never Used  . Alcohol use Yes     Comment: occasionally  . Drug use: No  . Sexual activity: Not Currently   Other Topics Concern  . Not on file   Social History Narrative   She lives in [place], family helps with her care.     reports that she has never smoked. She has never used smokeless tobacco. She reports that she drinks alcohol. She reports that she does not use drugs.  Family History  Problem Relation Age of Onset  . Heart disease Mother   . Heart disease Father   . Heart disease Brother        x 3   Family Status  Relation Status  . Mother Deceased  . Father Deceased  . Brother (Not Specified)    Immunization History  Administered Date(s) Administered  . Tdap 06/17/2014    Allergies  Allergen Reactions  . Codeine Nausea Only  . Garlic Nausea Only    Medications: Patient's Medications  New Prescriptions   No medications on file  Previous Medications   AMIODARONE (PACERONE) 100 MG TABLET    Take 100 mg by mouth daily.   APIXABAN (ELIQUIS) 2.5 MG TABS TABLET    Take 1 tablet (2.5 mg total) by mouth 2 (two) times daily.   BUSPIRONE (BUSPAR) 5 MG TABLET    Take 1 tablet (5 mg total) by mouth daily as needed.   FEEDING SUPPLEMENT, ENSURE ENLIVE, (ENSURE ENLIVE) LIQD    Take 237 mLs by mouth 3 (three) times daily between meals.   HYDROCODONE-ACETAMINOPHEN (NORCO) 5-325 MG TABLET    Take 1-2 tablets by mouth every 6 (six) hours as needed.   LISINOPRIL (PRINIVIL,ZESTRIL) 2.5 MG TABLET    TAKE ONE TABLET BY MOUTH ONE TIME DAILY   MULTIPLE VITAMIN (MULTIVITAMIN) TABLET    Take 1 tablet by mouth daily.   OMEGA-3 FATTY ACIDS (FISH OIL) 1000 MG CAPS    Take 1 capsule by mouth daily.   SODIUM CHLORIDE 0.9 % SOLN WITH MILRINONE 1 MG/ML SOLN 200 MCG/ML    Inject 0.125 mcg/kg/min into the vein continuous.   TORSEMIDE (DEMADEX) 20 MG TABLET     TAKE TWO TABLETS BY MOUTH TWICE DAILY  Modified Medications   No medications on file  Discontinued Medications   No medications on file    Review of Systems  Musculoskeletal: Positive for gait problem.  Psychiatric/Behavioral: Positive for confusion.  All other systems reviewed and are negative.   Vitals:   03/11/17 0856  BP: 118/62  Pulse: 82  Resp: 18  Temp: 97.8 F (36.6 C)  TempSrc: Oral  SpO2: 97%  Weight: 93 lb (42.2 kg)  Height: 5' (1.524 m)   Body mass index is 18.16 kg/m.  Physical Exam  Constitutional: She is oriented to person, place, and time. She appears well-developed.  Frail appearing in NAD, sitting up in bed  HENT:  Mouth/Throat: Oropharynx is clear and moist. No oropharyngeal exudate.  MMM; no oral thrush  Eyes: Pupils are equal, round, and reactive to light. No scleral icterus.  Neck: Neck supple. Carotid bruit is not present. No tracheal deviation present.  Cardiovascular: Normal rate, regular rhythm and intact distal pulses.  Exam reveals no gallop and no friction rub.   Murmur (1/6 SEM) heard. Right arm PICC line intact with no redness or d/c at insertion site with Milrinone gtt infusing  Pulmonary/Chest: Effort normal and breath sounds normal. No stridor. No respiratory distress. She has no wheezes. She has no rales.  Abdominal: Soft. Bowel sounds are normal. She exhibits no distension and no mass. There is no hepatomegaly. There is no tenderness. There is no rebound and no guarding.  Musculoskeletal: She exhibits edema.  FROM left toes; left thigh dsg c/d/i  Lymphadenopathy:    She has no cervical adenopathy.  Neurological: She is alert and oriented to person, place, and time.  Skin: Skin is warm and dry. No rash noted.  Psychiatric: She has a normal mood and affect. Her behavior is normal. Thought content normal.     Labs reviewed: Abstract on 03/10/2017  Component Date Value Ref Range Status  . Hemoglobin 03/10/2017 11.8* 12.0 - 16.0  g/dL Final  . HCT 03/10/2017 36  36 - 46 % Final  . Neutrophils Absolute 03/10/2017 5  /L Final  . Platelets 03/10/2017 283  150 - 399 K/L Final  . WBC 03/10/2017 7.8  10^3/mL Final  . Glucose 03/10/2017 96  mg/dL Final  . BUN 03/10/2017 23* 4 - 21 mg/dL Final  . Creatinine 03/10/2017 0.8  0.5 - 1.1 mg/dL Final  . Potassium 03/10/2017 4.4  3.4 - 5.3 mmol/L Final  . Sodium 03/10/2017 141  137 - 147 mmol/L Final  . Alkaline Phosphatase 03/10/2017 63  25 - 125 U/L Final  . ALT 03/10/2017 5* 7 - 35 U/L Final   <  . AST 03/10/2017 20  13 - 35 U/L Final  . Bilirubin, Total 03/10/2017 0.2  mg/dL Final  Admission on 03/04/2017, Discharged on 03/08/2017  Component Date Value Ref Range Status  . Troponin i, poc 03/04/2017 0.09* 0.00 - 0.08 ng/mL Final  . Comment 03/04/2017 NOTIFIED PHYSICIAN   Final  . Comment 3 03/04/2017          Final   Comment: Due to the release kinetics of cTnI, a negative result within the first hours of the onset of symptoms does not rule out myocardial infarction with certainty. If myocardial infarction is still suspected, repeat the test at appropriate intervals.   . B Natriuretic Peptide 03/04/2017 69.6  0.0 - 100.0 pg/mL Final  . WBC 03/04/2017 18.3* 4.0 - 10.5 K/uL Final  . RBC 03/04/2017 4.48  3.87 - 5.11 MIL/uL Final  . Hemoglobin 03/04/2017 13.2  12.0 - 15.0 g/dL Final  . HCT 03/04/2017 39.8  36.0 - 46.0 % Final  . MCV 03/04/2017 88.8  78.0 - 100.0 fL Final  . MCH 03/04/2017 29.5  26.0 - 34.0 pg Final  . MCHC 03/04/2017 33.2  30.0 - 36.0 g/dL Final  . RDW 03/04/2017 14.5  11.5 - 15.5 % Final  . Platelets 03/04/2017 272  150 - 400 K/uL Final  . Neutrophils Relative % 03/04/2017 90  % Final  . Lymphocytes Relative 03/04/2017 8  %  Final  . Monocytes Relative 03/04/2017 2  % Final  . Eosinophils Relative 03/04/2017 0  % Final  . Basophils Relative 03/04/2017 0  % Final  . Neutro Abs 03/04/2017 16.4* 1.7 - 7.7 K/uL Final  . Lymphs Abs 03/04/2017 1.5  0.7  - 4.0 K/uL Final  . Monocytes Absolute 03/04/2017 0.4  0.1 - 1.0 K/uL Final  . Eosinophils Absolute 03/04/2017 0.0  0.0 - 0.7 K/uL Final  . Basophils Absolute 03/04/2017 0.0  0.0 - 0.1 K/uL Final  . WBC Morphology 03/04/2017 VACUOLATED NEUTROPHILS   Final  . Sodium 03/04/2017 140  135 - 145 mmol/L Final  . Potassium 03/04/2017 3.6  3.5 - 5.1 mmol/L Final  . Chloride 03/04/2017 100* 101 - 111 mmol/L Final  . CO2 03/04/2017 27  22 - 32 mmol/L Final  . Glucose, Bld 03/04/2017 132* 65 - 99 mg/dL Final  . BUN 03/04/2017 20  6 - 20 mg/dL Final  . Creatinine, Ser 03/04/2017 0.99  0.44 - 1.00 mg/dL Final  . Calcium 03/04/2017 9.4  8.9 - 10.3 mg/dL Final  . Total Protein 03/04/2017 7.4  6.5 - 8.1 g/dL Final  . Albumin 03/04/2017 4.2  3.5 - 5.0 g/dL Final  . AST 03/04/2017 26  15 - 41 U/L Final  . ALT 03/04/2017 18  14 - 54 U/L Final  . Alkaline Phosphatase 03/04/2017 89  38 - 126 U/L Final  . Total Bilirubin 03/04/2017 0.7  0.3 - 1.2 mg/dL Final  . GFR calc non Af Amer 03/04/2017 48* >60 mL/min Final  . GFR calc Af Amer 03/04/2017 56* >60 mL/min Final   Comment: (NOTE) The eGFR has been calculated using the CKD EPI equation. This calculation has not been validated in all clinical situations. eGFR's persistently <60 mL/min signify possible Chronic Kidney Disease.   . Anion gap 03/04/2017 13  5 - 15 Final  . Prothrombin Time 03/04/2017 14.2  11.4 - 15.2 seconds Final  . INR 03/04/2017 1.10   Final  . MRSA, PCR 03/04/2017 NEGATIVE  NEGATIVE Final  . Staphylococcus aureus 03/04/2017 POSITIVE* NEGATIVE Final   Comment:        The Xpert SA Assay (FDA approved for NASAL specimens in patients over 31 years of age), is one component of a comprehensive surveillance program.  Test performance has been validated by [facility] for patients greater than or equal to 26 year old. It is not intended to diagnose infection nor to guide or monitor treatment.   . Troponin I 03/04/2017 0.23* <0.03  ng/mL Final   Comment: CRITICAL RESULT CALLED TO, READ BACK BY AND VERIFIED WITH: Bernadette Hoit RN [phone] 2018 GREEN R   . Troponin I 03/05/2017 0.17* <0.03 ng/mL Final   CRITICAL VALUE NOTED.  VALUE IS CONSISTENT WITH PREVIOUSLY REPORTED AND CALLED VALUE.  . Troponin I 03/05/2017 0.12* <0.03 ng/mL Final   CRITICAL VALUE NOTED.  VALUE IS CONSISTENT WITH PREVIOUSLY REPORTED AND CALLED VALUE.  . TSH 03/04/2017 0.394  0.350 - 4.500 uIU/mL Final   Performed by a 3rd Generation assay with a functional sensitivity of <=0.01 uIU/mL.  . WBC 03/04/2017 10.8* 4.0 - 10.5 K/uL Final  . RBC 03/04/2017 4.33  3.87 - 5.11 MIL/uL Final  . Hemoglobin 03/04/2017 12.4  12.0 - 15.0 g/dL Final  . HCT 03/04/2017 38.3  36.0 - 46.0 % Final  . MCV 03/04/2017 88.5  78.0 - 100.0 fL Final  . MCH 03/04/2017 28.6  26.0 - 34.0 pg Final  . MCHC  03/04/2017 32.4  30.0 - 36.0 g/dL Final  . RDW 03/04/2017 14.4  11.5 - 15.5 % Final  . Platelets 03/04/2017 244  150 - 400 K/uL Final  . Creatinine, Ser 03/04/2017 1.00  0.44 - 1.00 mg/dL Final  . GFR calc non Af Amer 03/04/2017 48* >60 mL/min Final  . GFR calc Af Amer 03/04/2017 55* >60 mL/min Final   Comment: (NOTE) The eGFR has been calculated using the CKD EPI equation. This calculation has not been validated in all clinical situations. eGFR's persistently <60 mL/min signify possible Chronic Kidney Disease.   . WBC 03/05/2017 9.4  4.0 - 10.5 K/uL Final  . RBC 03/05/2017 4.14  3.87 - 5.11 MIL/uL Final  . Hemoglobin 03/05/2017 11.6* 12.0 - 15.0 g/dL Final  . HCT 03/05/2017 36.8  36.0 - 46.0 % Final  . MCV 03/05/2017 88.9  78.0 - 100.0 fL Final  . MCH 03/05/2017 28.0  26.0 - 34.0 pg Final  . MCHC 03/05/2017 31.5  30.0 - 36.0 g/dL Final  . RDW 03/05/2017 14.5  11.5 - 15.5 % Final  . Platelets 03/05/2017 221  150 - 400 K/uL Final  . Sodium 03/05/2017 141  135 - 145 mmol/L Final  . Potassium 03/05/2017 3.6  3.5 - 5.1 mmol/L Final  . Chloride 03/05/2017 102  101 - 111 mmol/L  Final  . CO2 03/05/2017 29  22 - 32 mmol/L Final  . Glucose, Bld 03/05/2017 90  65 - 99 mg/dL Final  . BUN 03/05/2017 14  6 - 20 mg/dL Final  . Creatinine, Ser 03/05/2017 0.85  0.44 - 1.00 mg/dL Final  . Calcium 03/05/2017 8.5* 8.9 - 10.3 mg/dL Final  . GFR calc non Af Amer 03/05/2017 58* >60 mL/min Final  . GFR calc Af Amer 03/05/2017 >60  >60 mL/min Final   Comment: (NOTE) The eGFR has been calculated using the CKD EPI equation. This calculation has not been validated in all clinical situations. eGFR's persistently <60 mL/min signify possible Chronic Kidney Disease.   . Anion gap 03/05/2017 10  5 - 15 Final  . ABO/RH(D) 03/05/2017 O NEG   Final  . Antibody Screen 03/05/2017 NEG   Final  . Sample Expiration 03/05/2017 03/08/2017   Final  . WBC 03/06/2017 8.6  4.0 - 10.5 K/uL Final  . RBC 03/06/2017 3.57* 3.87 - 5.11 MIL/uL Final  . Hemoglobin 03/06/2017 10.2* 12.0 - 15.0 g/dL Final  . HCT 03/06/2017 31.8* 36.0 - 46.0 % Final  . MCV 03/06/2017 89.1  78.0 - 100.0 fL Final  . MCH 03/06/2017 28.6  26.0 - 34.0 pg Final  . MCHC 03/06/2017 32.1  30.0 - 36.0 g/dL Final  . RDW 03/06/2017 14.5  11.5 - 15.5 % Final  . Platelets 03/06/2017 200  150 - 400 K/uL Final  . Sodium 03/06/2017 138  135 - 145 mmol/L Final  . Potassium 03/06/2017 3.1* 3.5 - 5.1 mmol/L Final  . Chloride 03/06/2017 98* 101 - 111 mmol/L Final  . CO2 03/06/2017 26  22 - 32 mmol/L Final  . Glucose, Bld 03/06/2017 162* 65 - 99 mg/dL Final  . BUN 03/06/2017 18  6 - 20 mg/dL Final  . Creatinine, Ser 03/06/2017 1.13* 0.44 - 1.00 mg/dL Final  . Calcium 03/06/2017 8.3* 8.9 - 10.3 mg/dL Final  . GFR calc non Af Amer 03/06/2017 41* >60 mL/min Final  . GFR calc Af Amer 03/06/2017 48* >60 mL/min Final   Comment: (NOTE) The eGFR has been calculated using the CKD EPI  equation. This calculation has not been validated in all clinical situations. eGFR's persistently <60 mL/min signify possible Chronic Kidney Disease.   . Anion gap  03/06/2017 14  5 - 15 Final  . WBC 03/07/2017 7.6  4.0 - 10.5 K/uL Final  . RBC 03/07/2017 3.32* 3.87 - 5.11 MIL/uL Final  . Hemoglobin 03/07/2017 9.5* 12.0 - 15.0 g/dL Final  . HCT 63/84/1331 29.4* 36.0 - 46.0 % Final  . MCV 03/07/2017 88.6  78.0 - 100.0 fL Final  . MCH 03/07/2017 28.6  26.0 - 34.0 pg Final  . MCHC 03/07/2017 32.3  30.0 - 36.0 g/dL Final  . RDW 34/38/8179 14.4  11.5 - 15.5 % Final  . Platelets 03/07/2017 189  150 - 400 K/uL Final  . Sodium 03/07/2017 140  135 - 145 mmol/L Final  . Potassium 03/07/2017 2.9* 3.5 - 5.1 mmol/L Final  . Chloride 03/07/2017 99* 101 - 111 mmol/L Final  . CO2 03/07/2017 30  22 - 32 mmol/L Final  . Glucose, Bld 03/07/2017 95  65 - 99 mg/dL Final  . BUN 10/58/6100 16  6 - 20 mg/dL Final  . Creatinine, Ser 03/07/2017 0.90  0.44 - 1.00 mg/dL Final  . Calcium 42/90/6991 8.4* 8.9 - 10.3 mg/dL Final  . GFR calc non Af Amer 03/07/2017 54* >60 mL/min Final  . GFR calc Af Amer 03/07/2017 >60  >60 mL/min Final   Comment: (NOTE) The eGFR has been calculated using the CKD EPI equation. This calculation has not been validated in all clinical situations. eGFR's persistently <60 mL/min signify possible Chronic Kidney Disease.   . Anion gap 03/07/2017 11  5 - 15 Final  . Magnesium 03/07/2017 2.0  1.7 - 2.4 mg/dL Final  . Phosphorus 39/21/9268 2.8  2.5 - 4.6 mg/dL Final  . WBC 96/93/5292 7.9  4.0 - 10.5 K/uL Final  . RBC 03/08/2017 3.30* 3.87 - 5.11 MIL/uL Final  . Hemoglobin 03/08/2017 9.4* 12.0 - 15.0 g/dL Final  . HCT 59/18/2061 29.5* 36.0 - 46.0 % Final  . MCV 03/08/2017 89.4  78.0 - 100.0 fL Final  . MCH 03/08/2017 28.5  26.0 - 34.0 pg Final  . MCHC 03/08/2017 31.9  30.0 - 36.0 g/dL Final  . RDW 35/60/9064 14.6  11.5 - 15.5 % Final  . Platelets 03/08/2017 220  150 - 400 K/uL Final  . Sodium 03/08/2017 138  135 - 145 mmol/L Final  . Potassium 03/08/2017 4.4  3.5 - 5.1 mmol/L Final   DELTA CHECK NOTED  . Chloride 03/08/2017 101  101 - 111 mmol/L  Final  . CO2 03/08/2017 30  22 - 32 mmol/L Final  . Glucose, Bld 03/08/2017 98  65 - 99 mg/dL Final  . BUN 13/30/7552 14  6 - 20 mg/dL Final  . Creatinine, Ser 03/08/2017 0.84  0.44 - 1.00 mg/dL Final  . Calcium 79/23/3416 8.4* 8.9 - 10.3 mg/dL Final  . GFR calc non Af Amer 03/08/2017 59* >60 mL/min Final  . GFR calc Af Amer 03/08/2017 >60  >60 mL/min Final   Comment: (NOTE) The eGFR has been calculated using the CKD EPI equation. This calculation has not been validated in all clinical situations. eGFR's persistently <60 mL/min signify possible Chronic Kidney Disease.   Eustaquio Boyden gap 03/08/2017 7  5 - 15 Final  Hospital Outpatient Visit on 12/25/2016  Component Date Value Ref Range Status  . AV vel 12/25/2016 .88   Final  . LV PW d 12/25/2016 8.92* 0.6 - 1.1 mm  Final  . FS 12/25/2016 17* 28 - 44 % Final  . LA vol 12/25/2016 42.1  mL Final  . LA ID, A-P, ES 12/25/2016 35  mm Final  . IVS/LV PW RATIO, ED 12/25/2016 .77   Final  . LVOT VTI 12/25/2016 10.5  cm Final  . LV e' LATERAL 12/25/2016 8.37  cm/s Final  . LV E/e' medial 12/25/2016 5.1   Final  . LV E/e'average 12/25/2016 5.1   Final  . AV pk vel 12/25/2016 206  cm/s Final  . AV Area VTI index 12/25/2016 .65  cm2/m2 Final  . AV Area VTI 12/25/2016 .9  cm2 Final  . AV VEL mean LVOT/AV 12/25/2016 .26   Final  . AV Area mean vel 12/25/2016 .82  cm2 Final  . AV area mean vel ind 12/25/2016 .61  cm2/m2 Final  . LA diam index 12/25/2016 2.59  cm/m2 Final  . LA vol A4C 12/25/2016 36.4  ml Final  . Mean grad 12/25/2016 10  mmHg Final  . Valve area 12/25/2016 0.88  cm2 Final  . LVOT peak grad rest 12/25/2016 1  mmHg Final  . E decel time 12/25/2016 182  msec Final  . LVOT diameter 12/25/2016 20  mm Final  . LVOT area 12/25/2016 3.14  cm2 Final  . LVOT peak vel 12/25/2016 59.2  cm/s Final  . LVOT peak VTI 12/25/2016 .28  cm Final  . Ao pk vel 12/25/2016 .29  m/s Final  . VTI 12/25/2016 37.3  cm Final  . LVOT SV 12/25/2016 33.00   mL Final  . Peak grad 12/25/2016 17  mmHg Final  . E/e' ratio 12/25/2016 5.10   Final  . AO mean calculated velocity dopler 12/25/2016 147  cm/s Final  . MV pk E vel 12/25/2016 42.7  m/s Final  . MV pk A vel 12/25/2016 102  m/s Final  . LA vol index 12/25/2016 31.2  mL/m2 Final  . Valve area index 12/25/2016 .65   Final  . AV peak Index 12/25/2016 .67   Final  . MV Dec 12/25/2016 182   Final  . LA diam end sys 12/25/2016 35.00  mm Final  . TDI e' medial 12/25/2016 2.87   Final  . TDI e' lateral 12/25/2016 8.37   Final  . Lateral S' vel 12/25/2016 7.18  cm/sec Final  . TAPSE 12/25/2016 23.90  mm Final    Dg Chest 2 View  Result Date: 03/04/2017 CLINICAL DATA:  83 year old female fell this morning. Left hip pain. Initial encounter. EXAM: CHEST  2 VIEW COMPARISON:  01/08/2016 chest x-ray. FINDINGS: Right PICC line tip mid to distal superior vena cava level. Cardiomegaly. Tortuous aorta. Central pulmonary vascular prominence without pulmonary edema or segmental consolidation. No obvious rib fracture or pneumothorax. Bilateral shoulder joint degenerative changes. Probable calcified fibroadenoma left breast. IMPRESSION: Cardiomegaly. Tortuous aorta. Central pulmonary vascular prominence without pulmonary edema. Bilateral shoulder joint degenerative changes. Electronically Signed   By: Genia Del M.D.   On: 03/04/2017 13:31   Ct Head Wo Contrast  Result Date: 03/04/2017 CLINICAL DATA:  83 year old female post fall (tripping over of electric cord). Denies head or neck discomfort. Initial encounter. EXAM: CT HEAD WITHOUT CONTRAST CT CERVICAL SPINE WITHOUT CONTRAST TECHNIQUE: Multidetector CT imaging of the head and cervical spine was performed following the standard protocol without intravenous contrast. Multiplanar CT image reconstructions of the cervical spine were also generated. COMPARISON:  09/07/2015 head CT. 06/17/2014 head CT and cervical spine CT. FINDINGS: CT HEAD FINDINGS  No intracranial  hemorrhage or CT evidence of large acute infarct. Chronic microvascular changes. Atrophy without hydrocephalus. No intracranial mass lesion noted on this unenhanced exam. Vascular: Vascular calcifications Skull: No skull fracture Sinuses/Orbits: Banding right globe without acute abnormality. Visualized paranasal sinuses are clear. Other: Negative CT CERVICAL SPINE FINDINGS Alignment: Within normal limits. Skull base and vertebrae: No cervical spine fracture. Soft tissues and spinal canal: No abnormal prevertebral soft tissue swelling. Disc levels: Spur with mild narrowing ventral thecal sac C4-5 thru C6-7. Upper chest: Negative for worrisome abnormality. Other: Negative for worrisome abnormality. IMPRESSION: No skull fracture or intracranial hemorrhage. No cervical spine fracture or abnormal prevertebral soft tissue swelling. Electronically Signed   By: Genia Del M.D.   On: 03/04/2017 14:26   Ct Cervical Spine Wo Contrast  Result Date: 03/04/2017 CLINICAL DATA:  83 year old female post fall (tripping over of electric cord). Denies head or neck discomfort. Initial encounter. EXAM: CT HEAD WITHOUT CONTRAST CT CERVICAL SPINE WITHOUT CONTRAST TECHNIQUE: Multidetector CT imaging of the head and cervical spine was performed following the standard protocol without intravenous contrast. Multiplanar CT image reconstructions of the cervical spine were also generated. COMPARISON:  09/07/2015 head CT. 06/17/2014 head CT and cervical spine CT. FINDINGS: CT HEAD FINDINGS No intracranial hemorrhage or CT evidence of large acute infarct. Chronic microvascular changes. Atrophy without hydrocephalus. No intracranial mass lesion noted on this unenhanced exam. Vascular: Vascular calcifications Skull: No skull fracture Sinuses/Orbits: Banding right globe without acute abnormality. Visualized paranasal sinuses are clear. Other: Negative CT CERVICAL SPINE FINDINGS Alignment: Within normal limits. Skull base and vertebrae: No  cervical spine fracture. Soft tissues and spinal canal: No abnormal prevertebral soft tissue swelling. Disc levels: Spur with mild narrowing ventral thecal sac C4-5 thru C6-7. Upper chest: Negative for worrisome abnormality. Other: Negative for worrisome abnormality. IMPRESSION: No skull fracture or intracranial hemorrhage. No cervical spine fracture or abnormal prevertebral soft tissue swelling. Electronically Signed   By: Genia Del M.D.   On: 03/04/2017 14:26   Pelvis Portable  Result Date: 03/05/2017 CLINICAL DATA:  S/P ANTERIOR APPROACH LEFT HIP HEMI ARTHROPLASTY (Left Hip) Included additional left hip image to include entire hip. EXAM: PORTABLE PELVIS 1-2 VIEWS COMPARISON:  03/05/2017 portable exam FINDINGS: Status post hemiarthroplasty of the left hip. Surgical clips overlie the left hip in there is postoperative gas in the soft tissues. The femoral head component is well seated in the acetabular component based on the frontal projection. No interval fractures. Degenerative changes are seen in the lower lumbar spine and right hip. Regional bowel gas pattern is nonobstructive. IMPRESSION: Status post hemiarthroplasty of the left hip.  No adverse features. Electronically Signed   By: Nolon Nations M.D.   On: 03/05/2017 20:04   Dg C-arm 1-60 Min  Result Date: 03/05/2017 CLINICAL DATA:  Hemiarthroplasty of the left hip. [place] OR-2. Fluoro time: 89mnutes, 12seconds. EXAM: OPERATIVE LEFT HIP (WITH PELVIS IF PERFORMED) 3 VIEWS TECHNIQUE: Fluoroscopic spot image(s) were submitted for interpretation post-operatively. COMPARISON:  03/04/2017 FINDINGS: Initial image demonstrates subcapital femoral neck fracture. The patient has undergone hemiarthroplasty of the left hip. The femoral head component is well seated in the acetabulum. No new fractures identified. IMPRESSION: Left hip hemiarthroplasty for a subcapital femoral neck fracture. No adverse features Electronically Signed   By: ENolon Nations M.D.   On: 03/05/2017 19:50   Dg Hip Operative Unilat W Or W/o Pelvis Left  Result Date: 03/05/2017 CLINICAL DATA:  Hemiarthroplasty of the left hip. [place] OR-2. Fluoro  time: 41mnutes, 12seconds. EXAM: OPERATIVE LEFT HIP (WITH PELVIS IF PERFORMED) 3 VIEWS TECHNIQUE: Fluoroscopic spot image(s) were submitted for interpretation post-operatively. COMPARISON:  03/04/2017 FINDINGS: Initial image demonstrates subcapital femoral neck fracture. The patient has undergone hemiarthroplasty of the left hip. The femoral head component is well seated in the acetabulum. No new fractures identified. IMPRESSION: Left hip hemiarthroplasty for a subcapital femoral neck fracture. No adverse features Electronically Signed   By: ENolon NationsM.D.   On: 03/05/2017 19:50   Dg Hip Unilat With Pelvis 2-3 Views Left  Result Date: 03/04/2017 CLINICAL DATA:  Fall, acute severe left hip pain EXAM: DG HIP (WITH OR WITHOUT PELVIS) 2-3V LEFT COMPARISON:  None available FINDINGS: There is an acute displaced and angulated left hip subcapital femoral neck fracture. Bones are osteopenic. Bony pelvis appears intact. Degenerative changes of the lower lumbar spine, SI joints, and both hips. Peripheral atherosclerosis evident. Normal bowel gas pattern. IMPRESSION: Acute left hip subcapital femoral neck fracture. Atherosclerosis Electronically Signed   By: MJerilynn Mages  Shick M.D.   On: 03/04/2017 13:14   Dg Femur Min 2 Views Left  Result Date: 03/04/2017 CLINICAL DATA:  Status post fall.  Left hip pain. EXAM: LEFT FEMUR 2 VIEWS COMPARISON:  None. FINDINGS: Left hip joint is excluded from the field of view. Visualized portions of the left femur demonstrate no fracture or dislocation. Mild tricompartmental osteoarthritis of the left knee. Generalized osteopenia. No significant joint effusion. No aggressive lytic or sclerotic osseous lesion. Peripheral vascular atherosclerotic disease. No soft tissue abnormality. IMPRESSION: Left hip joint is excluded  from the field of view and assessed on left hip x-rays performed same day. Visualized left femur demonstrates no acute fracture or dislocation. Electronically Signed   By: HKathreen Devoid  On: 03/04/2017 13:26     Assessment/Plan   ICD-9-CM ICD-10-CM   1. Status post hip surgery V45.89 Z98.890    due to displaced left femoral neck fx  2. Chronic systolic heart failure (HCC) 428.22 I50.22   3. Dilated cardiomyopathy (HCC) 425.4 I42.0   4. Persistent atrial fibrillation (HCC) 427.31 I48.1   5. Essential hypertension 401.9 I10   6. Mixed hyperlipidemia 272.2 E78.2     S/w pt's son, LEdwyna Shell), by phone ([phone]. [place] He is [place] a part for his car so that it may be repaired. [facility]. Relayed this info to SW  Check CBC, BMP in 1 week  F/u with Ortho and Cardio as scheduled  Wound care as ordered  Cont current meds as ordered  PICC line care as indicated. Milrinone gtt per [facility]  Cont nutritional supplements as ordered  PT/OT/ST as ordered  GOAL: short term rehab and d/c home vs ALF when medically appropriate. Communicated with pt and nursing.  Will follow  Talayia Hjort S. CPerlie Gold P[facility] [address]are[address] [place] [address]([phone] (Monday-Friday 8 AM - 5 PM) ([phone] 5 PM and follow prompts

## 2017-03-13 DIAGNOSIS — Z9181 History of falling: Secondary | ICD-10-CM | POA: Diagnosis not present

## 2017-03-13 DIAGNOSIS — R2689 Other abnormalities of gait and mobility: Secondary | ICD-10-CM | POA: Diagnosis not present

## 2017-03-17 DIAGNOSIS — Z9181 History of falling: Secondary | ICD-10-CM | POA: Diagnosis not present

## 2017-03-17 DIAGNOSIS — R2689 Other abnormalities of gait and mobility: Secondary | ICD-10-CM | POA: Diagnosis not present

## 2017-03-18 ENCOUNTER — Encounter (INDEPENDENT_AMBULATORY_CARE_PROVIDER_SITE_OTHER): Payer: Self-pay | Admitting: Orthopaedic Surgery

## 2017-03-18 ENCOUNTER — Ambulatory Visit (INDEPENDENT_AMBULATORY_CARE_PROVIDER_SITE_OTHER): Payer: Medicare Other

## 2017-03-18 ENCOUNTER — Ambulatory Visit (INDEPENDENT_AMBULATORY_CARE_PROVIDER_SITE_OTHER): Payer: Medicare Other | Admitting: Orthopaedic Surgery

## 2017-03-18 DIAGNOSIS — S72002A Fracture of unspecified part of neck of left femur, initial encounter for closed fracture: Secondary | ICD-10-CM

## 2017-03-18 LAB — BASIC METABOLIC PANEL
BUN: 23 — AB (ref 4–21)
Creatinine: 1.1 (ref 0.5–1.1)
Glucose: 93
Potassium: 3.3 — AB (ref 3.4–5.3)
Sodium: 144 (ref 137–147)

## 2017-03-18 LAB — CBC AND DIFFERENTIAL
HCT: 36 (ref 36–46)
Hemoglobin: 11.8 — AB (ref 12.0–16.0)
Neutrophils Absolute: 16
Platelets: 571 — AB (ref 150–399)
WBC: 18.2

## 2017-03-18 NOTE — Progress Notes (Signed)
Patient is 2 weeks status post left hip hemiarthroplasty. She is at a nursing home. She does not endorse any significant pain. The incisions healed. The staples were removed. Continue with physical therapy for strengthening and mobilization. X-ray show stable hemiarthroplasty. I'll see her back in 6 weeks for recheck. Standing AP pelvis of possible.

## 2017-03-21 ENCOUNTER — Ambulatory Visit (HOSPITAL_COMMUNITY)
Admission: RE | Admit: 2017-03-21 | Discharge: 2017-03-21 | Disposition: A | Discharge status: Home / Self Care | Payer: No Typology Code available for payment source | Source: Ambulatory Visit | Attending: Internal Medicine | Admitting: Internal Medicine

## 2017-03-21 ENCOUNTER — Encounter (HOSPITAL_COMMUNITY): Payer: Self-pay | Admitting: Internal Medicine

## 2017-03-21 VITALS — BP 152/90 | HR 60 | Wt 88.2 lb

## 2017-03-21 DIAGNOSIS — I2510 Atherosclerotic heart disease of native coronary artery without angina pectoris: Secondary | ICD-10-CM | POA: Diagnosis not present

## 2017-03-21 DIAGNOSIS — F0390 Unspecified dementia without behavioral disturbance: Secondary | ICD-10-CM | POA: Insufficient documentation

## 2017-03-21 DIAGNOSIS — Z7901 Long term (current) use of anticoagulants: Secondary | ICD-10-CM | POA: Diagnosis not present

## 2017-03-21 DIAGNOSIS — I429 Cardiomyopathy, unspecified: Secondary | ICD-10-CM | POA: Insufficient documentation

## 2017-03-21 DIAGNOSIS — I480 Paroxysmal atrial fibrillation: Secondary | ICD-10-CM | POA: Diagnosis not present

## 2017-03-21 DIAGNOSIS — I4891 Unspecified atrial fibrillation: Secondary | ICD-10-CM | POA: Diagnosis not present

## 2017-03-21 DIAGNOSIS — I5022 Chronic systolic (congestive) heart failure: Secondary | ICD-10-CM | POA: Insufficient documentation

## 2017-03-21 NOTE — Progress Notes (Addendum)
Patient ID: Jacqueline Reilly, female   DOB: 02/15/1924, 83 y.o.   MRN: [id number]   ADVANCED HF CLINIC NOTE  Patient ID: Jacqueline Reilly, female   DOB: 1924-07-03, 83 y.o.   MRN: [id number] PCP: [name] CHF: Ahuva Poynor  HPI: Jacqueline Reilly) is an 83 y/o woman with CAD, AAA, systolic HF, mild dementia, afib and CAD. She has had two previous coronary interventions including a cutting balloon to her D2 in 2007 and a DES to her mid LCX in 2010. Her LV dysfunction has been out of proportion to her CAD.  Last echo in 2/18 showed EF 20-25% with severe LV dilation.  Admitted 7/11-7/24/15 with syncope and dyspnea. She had new onset A-fib/RVR. Troponin was 1.03> 1.59> 2.18 . She was placed on amiodarone and heparin drip. She developed respiratory distress and required intubation. She converted to NSR but later was bradycardiac with NSVT. Amiodarone temporarily stopped but restarted after hyperkalemia resolved. Hyperkalemia was thought to be contributing to bradycardia. Diuresed with IV lasix. Co-ox 29% and started on milrinone which we tried to wean off but she did not tolerate. Discharge weight 87 lbs.   Admitted in November 2016 for Gram-negative bacteremia ([place]) - enterobacter asburiae from PICC. Treated with ceftriaxone and then switched to oral levofloxacin for a total of 21 weeks and PICC replaced.   She has remained on milrinone for nearly 3 years now.   Last month I brought the Alfonzo's in today to discuss recent events between her husband and [facility] and notify them that she would no longer be able to get milrinone at home with [facility] support . I t was eventually decided that her son, larry, would move from [place] to help support them.  Unfortunately she was admitted earlier this month with hip fracture and underwent repair. Now at [facility]. She is confused today. All she can tell me is that she is miserable and wants to go home. Apparently walking at SNF. Deneis SOB, orthopnea or PND. Remains on  milrinone.    Echo 8/16  EF 25% with diffuse hypokinesis, mild AS/mild AI, RV mildly dilated with moderately decreased systolic function.  Echo 2/18EF 25-30%   ROS: All systems negative except as listed in HPI, PMH and Problem List.  SH:  Social History   Social History  . Marital status: Married    Spouse name: N/A  . Number of children: 1  . Years of education: N/A   Occupational History  . retired    Social History Main Topics  . Smoking status: Never Smoker  . Smokeless tobacco: Never Used  . Alcohol use Yes     Comment: occasionally  . Drug use: No  . Sexual activity: Not Currently   Other Topics Concern  . Not on file   Social History Narrative   She lives in [place], family helps with her care.    FH:  Family History  Problem Relation Age of Onset  . Heart disease Mother   . Heart disease Father   . Heart disease Brother        x 3    Past Medical History:  Diagnosis Date  . AAA (abdominal aortic aneurysm) ([place])   . Atrial fibrillation ([place])   . CHF (congestive heart failure) ([place])   . Chronic systolic heart failure ([place])    a. ECHO (04/2014) EF 20-25%, diff HK, mild MR  . Coronary artery disease 2007   moderate ASCAD of the left system s/p PCI of  the D2 and PCI of the left circ 03/2009  . Hyperlipidemia   . Hypertension   . PVD (peripheral vascular disease) ([place])   . Renal artery stenosis ([place])   . Ventricular dysfunction    left; ischemic    Current Outpatient Prescriptions  Medication Sig Dispense Refill  . amiodarone (PACERONE) 100 MG tablet Take 100 mg by mouth daily.    Marland Kitchen apixaban (ELIQUIS) 2.5 MG TABS tablet Take 1 tablet (2.5 mg total) by mouth 2 (two) times daily. 180 tablet 3  . busPIRone (BUSPAR) 5 MG tablet Take 1 tablet (5 mg total) by mouth daily as needed. 2 tablet 0  . feeding supplement, ENSURE ENLIVE, (ENSURE ENLIVE) LIQD Take 237 mLs by mouth 3 (three) times daily between meals. 237 mL 12  . HYDROcodone-acetaminophen  (NORCO) 5-325 MG tablet Take 1-2 tablets by mouth every 6 (six) hours as needed. 90 tablet 0  . lisinopril (PRINIVIL,ZESTRIL) 2.5 MG tablet TAKE ONE TABLET BY MOUTH ONE TIME DAILY 90 tablet 3  . Multiple Vitamin (MULTIVITAMIN) tablet Take 1 tablet by mouth daily. 30 tablet 1  . Omega-3 Fatty Acids (FISH OIL) 1000 MG CAPS Take 1 capsule by mouth daily.    Marland Kitchen torsemide (DEMADEX) 20 MG tablet TAKE TWO TABLETS BY MOUTH TWICE DAILY 120 tablet 3  . sodium chloride 0.9 % SOLN with milrinone 1 MG/ML SOLN 200 mcg/mL Inject 0.125 mcg/kg/min into the vein continuous.     No current facility-administered medications for this encounter.     Vitals:   03/21/17 1132  BP: (!) 152/90  Pulse: 60  SpO2: 97%  Weight: 88 lb 4 oz (40 kg)    Wt Readings from Last 3 Encounters:  03/21/17 88 lb 4 oz (40 kg)  03/11/17 93 lb (42.2 kg)  03/10/17 93 lb (42.2 kg)   PHYSICAL EXAM: General:  Elderly appearing. Frail. Unkempt. No resp difficulty. Aide rpesent  HEENT: normal  anicterc Neck: supple. JVP 8-9. Carotids 2+ bilaterally no bruit. No LAD/thyromegaly  Cor: PMI laterally displaced .Regular with occasional ectopy. 2/6 SEM RUSB Lungs: clear no wheezing  Abdomen: soft. NT/ND good bs Extremities: No c/c/e. Warm. RUE PICC. Site clean  Neuro: alert & oriented. Poor memory  ASSESSMENT & PLAN:   1) Chronic Systolic Heart Failure: Mixed ischemic/nonischemic cardiomyopathy (degree of LV dysfunction is out of proportion to CAD), EF 25% with moderate RV systolic dysfunction .  Echo  12/25/16 EF 25-30% --Stable NYHA II symptoms and volume status stable on milrinone. Will continue milrinone for palliative care purposes (has failed wean in past) --If she gets d/c'd from SNF will need her son, Jacqueline Reilly, to move to [place] to continue milrinone support through [facility] or other agency --Check BMET next week 2) Atrial fibrillation:  -Paroxysmal.  Regular rhythm. Continue apixaban 2.5 mg bid (weight less than 60 kg and age >20).  Continue amiodarone 100 mg daily.   3) Limited Code: No CPR or defibrillation 4) CAD: No chest pain. Off ASA with Eliquis.  5) Anemia: Hemoglobin ok from recent lab work.  6) Dementia; 7) Recent hip fracture --stable    Glori Bickers MD  03/21/2017

## 2017-03-25 DIAGNOSIS — Z9181 History of falling: Secondary | ICD-10-CM | POA: Diagnosis not present

## 2017-03-25 DIAGNOSIS — R2689 Other abnormalities of gait and mobility: Secondary | ICD-10-CM | POA: Diagnosis not present

## 2017-03-31 DIAGNOSIS — Z9181 History of falling: Secondary | ICD-10-CM | POA: Diagnosis not present

## 2017-03-31 DIAGNOSIS — R2689 Other abnormalities of gait and mobility: Secondary | ICD-10-CM | POA: Diagnosis not present

## 2017-03-31 NOTE — Addendum Note (Signed)
Addendum  created 03/31/17 1457 by Oleta Mouse, MD   Sign clinical note

## 2017-04-04 ENCOUNTER — Observation Stay (HOSPITAL_COMMUNITY)
Admission: EM | Admit: 2017-04-04 | Discharge: 2017-04-05 | Disposition: A | Discharge status: Home / Self Care | Payer: Medicare Other | Attending: Internal Medicine | Admitting: Internal Medicine

## 2017-04-04 ENCOUNTER — Encounter (HOSPITAL_COMMUNITY): Payer: Self-pay | Admitting: Emergency Medicine

## 2017-04-04 DIAGNOSIS — I110 Hypertensive heart disease with heart failure: Secondary | ICD-10-CM | POA: Insufficient documentation

## 2017-04-04 DIAGNOSIS — R0602 Shortness of breath: Secondary | ICD-10-CM | POA: Diagnosis not present

## 2017-04-04 DIAGNOSIS — Z7901 Long term (current) use of anticoagulants: Secondary | ICD-10-CM | POA: Insufficient documentation

## 2017-04-04 DIAGNOSIS — I10 Essential (primary) hypertension: Secondary | ICD-10-CM | POA: Diagnosis present

## 2017-04-04 DIAGNOSIS — I420 Dilated cardiomyopathy: Secondary | ICD-10-CM

## 2017-04-04 DIAGNOSIS — R5381 Other malaise: Secondary | ICD-10-CM | POA: Diagnosis present

## 2017-04-04 DIAGNOSIS — I2510 Atherosclerotic heart disease of native coronary artery without angina pectoris: Secondary | ICD-10-CM | POA: Diagnosis not present

## 2017-04-04 DIAGNOSIS — R5383 Other fatigue: Secondary | ICD-10-CM | POA: Insufficient documentation

## 2017-04-04 DIAGNOSIS — I4891 Unspecified atrial fibrillation: Secondary | ICD-10-CM | POA: Diagnosis present

## 2017-04-04 DIAGNOSIS — Z79899 Other long term (current) drug therapy: Secondary | ICD-10-CM | POA: Diagnosis not present

## 2017-04-04 DIAGNOSIS — E876 Hypokalemia: Secondary | ICD-10-CM | POA: Diagnosis present

## 2017-04-04 DIAGNOSIS — I5022 Chronic systolic (congestive) heart failure: Principal | ICD-10-CM | POA: Insufficient documentation

## 2017-04-04 DIAGNOSIS — I5020 Unspecified systolic (congestive) heart failure: Secondary | ICD-10-CM | POA: Diagnosis not present

## 2017-04-04 DIAGNOSIS — Z955 Presence of coronary angioplasty implant and graft: Secondary | ICD-10-CM | POA: Insufficient documentation

## 2017-04-04 DIAGNOSIS — I255 Ischemic cardiomyopathy: Secondary | ICD-10-CM | POA: Diagnosis present

## 2017-04-04 DIAGNOSIS — Z96642 Presence of left artificial hip joint: Secondary | ICD-10-CM | POA: Insufficient documentation

## 2017-04-04 LAB — CBC WITH DIFFERENTIAL/PLATELET
Basophils Absolute: 0 10*3/uL (ref 0.0–0.1)
Basophils Relative: 0 %
Eosinophils Absolute: 0.2 10*3/uL (ref 0.0–0.7)
Eosinophils Relative: 3 %
HCT: 34.7 % — ABNORMAL LOW (ref 36.0–46.0)
Hemoglobin: 11.1 g/dL — ABNORMAL LOW (ref 12.0–15.0)
Lymphocytes Relative: 38 %
Lymphs Abs: 2.5 10*3/uL (ref 0.7–4.0)
MCH: 29 pg (ref 26.0–34.0)
MCHC: 32 g/dL (ref 30.0–36.0)
MCV: 90.6 fL (ref 78.0–100.0)
Monocytes Absolute: 0.6 10*3/uL (ref 0.1–1.0)
Monocytes Relative: 8 %
Neutro Abs: 3.5 10*3/uL (ref 1.7–7.7)
Neutrophils Relative %: 51 %
Platelets: 294 10*3/uL (ref 150–400)
RBC: 3.83 MIL/uL — ABNORMAL LOW (ref 3.87–5.11)
RDW: 15.7 % — ABNORMAL HIGH (ref 11.5–15.5)
WBC: 6.8 10*3/uL (ref 4.0–10.5)

## 2017-04-04 NOTE — ED Triage Notes (Signed)
Per EMS, patient is from [facility].  Patient is on milrinone drip and the pump is turning off randomly.  Doctor notified and stated that a new pump can not be placed until tomorrow morning so he wanted patient seen in ED in case something went wrong.  Picc line on left, single lumen.  Hx of CHF and dementia.  No other complaints.  142/86, HR 66, 95% RA.

## 2017-04-04 NOTE — ED Provider Notes (Signed)
[facility] DEPT Provider Note   CSN: [id number] Arrival date & time: 04/04/17  2302    By signing my name below, I, Fabian Sharp, attest that this documentation has been prepared under the direction and in the presence of Brynn Reznik, Annie Main, MD . Electronically Signed: Fabian Sharp, ED Scribe. 04/04/2017. 11:19 PM.   History   Chief Complaint Chief Complaint  Patient presents with  . Pump malfunction    HPI Jacqueline Reilly is a 83 y.o. female who presents to the Emergency Department BIB EMS from [facility] complaining of a milrinone pump malfunction onset 4 days ago. Pt has been experiencing associated fatigue and loss of appetite. She hasn't tried any medications for her symptoms. Pt son reports that she has had the milrinone pump x 3 years and that [place] has administered the medication. Son states that he decided to "crimp" the line to see if the pt who is hard of hearing would be able to hear the alarm, to which the alarm never went off. Son reports that he had the pt home nurse evaluate the milrinone pump and was informed that the milrinone pump has been turning off q 10 minutes for several minutes at a time x 4 days. Son reports that the pt recently had left hip replacement surgery 2.5 weeks ago and has been in [facility] [place] since the surgery. Pt denies any SOB, fever,  chest pain, abdominal pain, recent falls, and any other symptoms.    The history is provided by the patient and a relative. No language interpreter was used.    Past Medical History:  Diagnosis Date  . AAA (abdominal aortic aneurysm) ([place])   . Atrial fibrillation ([place])   . CHF (congestive heart failure) ([place])   . Chronic systolic heart failure ([place])    a. ECHO (04/2014) EF 20-25%, diff HK, mild MR  . Coronary artery disease 2007   moderate ASCAD of the left system s/p PCI of the D2 and PCI of the left circ 03/2009  . Hyperlipidemia   . Hypertension   . PVD  (peripheral vascular disease) ([place])   . Renal artery stenosis ([place])   . Ventricular dysfunction    left; ischemic    Patient Active Problem List   Diagnosis Date Noted  . Malnutrition of moderate degree 03/05/2017  . Left displaced femoral neck fracture ([place]) 03/04/2017  . Leukocytosis 03/04/2017  . Hypoxia   . Gram-negative bacteremia 09/01/2015  . Chronic anemia 09/01/2015  . Persistent atrial fibrillation ([place])   . Chronic systolic CHF (congestive heart failure) ([place]) 06/21/2015  . Atrial fibrillation ([place]) 08/10/2014  . Physical deconditioning 05/26/2014  . Acute respiratory failure with hypoxia ([place]) 05/08/2014  . NSTEMI, initial episode of care ([place]) 05/07/2014  . Atrial fibrillation with rapid ventricular response ([place]) 05/07/2014  . NSTEMI (non-ST elevated myocardial infarction) ([place]) 05/07/2014  . Abnormal chest x-ray 04/24/2014  . CHF (congestive heart failure) ([place]) 04/23/2014  . Acute on chronic combined systolic and diastolic congestive heart failure ([place]) 04/23/2014  . AAA (abdominal aortic aneurysm) ([place]) 10/07/2013  . SOB (shortness of breath) 09/30/2013  . Coronary artery disease   . Ischemic dilated cardiomyopathy ([place])   . Hypertension     Past Surgical History:  Procedure Laterality Date  . ANGIOPLASTY    . ANTERIOR APPROACH HEMI HIP ARTHROPLASTY Left 03/05/2017   Procedure: ANTERIOR APPROACH LEFT HIP HEMI ARTHROPLASTY;  Surgeon: Leandrew Koyanagi, MD;  Location: [facility];  Service: Orthopedics;  Laterality: Left;  . CORONARY STENT PLACEMENT    . IR GENERIC HISTORICAL  09/25/2016   IR FLUORO GUIDE CV LINE RIGHT 09/25/2016 Ardis Rowan, PA-C [facility]-INTERV RAD  . retinal cryopexy     right eye (for retinal detachment)    OB History    No data available       Home Medications    Prior to Admission medications   Medication Sig Start Date End Date Taking? Authorizing Provider  amiodarone (PACERONE) 100 MG tablet Take 100 mg by mouth daily.    [provider]  apixaban (ELIQUIS) 2.5 MG TABS tablet Take 1 tablet (2.5 mg total) by mouth 2 (two) times daily. 11/22/16   Bensimhon, Shaune Pascal, MD  busPIRone (BUSPAR) 5 MG tablet Take 1 tablet (5 mg total) by mouth daily as needed. 02/03/17   Bensimhon, Shaune Pascal, MD  feeding supplement, ENSURE ENLIVE, (ENSURE ENLIVE) LIQD Take 237 mLs by mouth 3 (three) times daily between meals. 03/08/17   Velvet Bathe, MD  HYDROcodone-acetaminophen (NORCO) 5-325 MG tablet Take 1-2 tablets by mouth every 6 (six) hours as needed. 03/05/17   Leandrew Koyanagi, MD  lisinopril (PRINIVIL,ZESTRIL) 2.5 MG tablet TAKE ONE TABLET BY MOUTH ONE TIME DAILY 11/21/16   Bensimhon, Shaune Pascal, MD  Multiple Vitamin (MULTIVITAMIN) tablet Take 1 tablet by mouth daily. 07/26/14   Larey Dresser, MD  Omega-3 Fatty Acids (FISH OIL) 1000 MG CAPS Take 1 capsule by mouth daily.    [provider]  sodium chloride 0.9 % SOLN with milrinone 1 MG/ML SOLN 200 mcg/mL Inject 0.125 mcg/kg/min into the vein continuous.    [provider]  torsemide (DEMADEX) 20 MG tablet TAKE TWO TABLETS BY MOUTH TWICE DAILY 01/31/17   Bensimhon, Shaune Pascal, MD    Family History Family History  Problem Relation Age of Onset  . Heart disease Mother   . Heart disease Father   . Heart disease Brother        x 3    Social History Social History  Substance Use Topics  . Smoking status: Never Smoker  . Smokeless tobacco: Never Used  . Alcohol use Yes     Comment: occasionally     Allergies   Codeine and Garlic   Review of Systems Review of Systems All systems reviewed and are negative for acute change except as noted in the HPI.  Physical Exam Updated Vital Signs BP 106/63   Pulse 66   Temp 98.4 F (36.9 C)   Resp 15   Ht 4\' 11"  (1.499 m)   Wt 41.6 kg (91 lb 12.8 oz)   SpO2 97%   BMI 18.54 kg/m   Ht 5' (1.524 m)   Wt 88 lb (39.9 kg)   BMI 17.19 kg/m   Physical Exam  Constitutional: She is oriented to person, place, and time.  She appears well-developed and well-nourished. No distress.  HENT:  Head: Normocephalic and atraumatic.  Mouth/Throat: Oropharynx is clear and moist. No oropharyngeal exudate.  Eyes: Conjunctivae and EOM are normal. Pupils are equal, round, and reactive to light.  Neck: Normal range of motion. Neck supple.  No meningismus.  Cardiovascular: Normal rate, normal heart sounds and intact distal pulses.  An irregular rhythm present.  No murmur heard. Irregular rhythm  Pulmonary/Chest: Effort normal and breath sounds normal. No respiratory distress.  Abdominal: Soft. There is no tenderness. There is no rebound and no guarding.  Musculoskeletal: Normal range of motion. She exhibits  no edema or tenderness.  PICC line in left arm that appears clean. No peripheral edema.  Neurological: She is alert and oriented to person, place, and time. No cranial nerve deficit. She exhibits normal muscle tone. Coordination normal.   5/5 strength throughout. CN 2-12 intact.Equal grip strength.   Skin: Skin is warm.  Psychiatric: She has a normal mood and affect. Her behavior is normal.  Nursing note and vitals reviewed.    ED Treatments / Results  DIAGNOSTIC STUDIES:  Oxygen Saturation is 99% on RA, normal by my interpretation.    COORDINATION OF CARE:  11:28 PM Discussed treatment plan with pt at bedside and pt agreed to plan.  12:35 AM Consult with Cardiology recommends admission with observation milrinone pump.  Labs (all labs ordered are listed, but only abnormal results are displayed) Labs Reviewed  CBC WITH DIFFERENTIAL/PLATELET - Abnormal; Notable for the following:       Result Value   RBC 3.83 (*)    Hemoglobin 11.1 (*)    HCT 34.7 (*)    RDW 15.7 (*)    All other components within normal limits  BASIC METABOLIC PANEL - Abnormal; Notable for the following:    Potassium 3.0 (*)    Calcium 8.6 (*)    GFR calc non Af Amer 54 (*)    All other components within normal limits  BRAIN  NATRIURETIC PEPTIDE - Abnormal; Notable for the following:    B Natriuretic Peptide 412.3 (*)    All other components within normal limits  COOXEMETRY PANEL - Abnormal; Notable for the following:    Total hemoglobin 10.9 (*)    All other components within normal limits  MRSA PCR SCREENING  TROPONIN I  BASIC METABOLIC PANEL    EKG  EKG Interpretation  Date/Time:  Friday April 04 2017 23:27:28 EDT Ventricular Rate:  69 PR Interval:    QRS Duration: 142 QT Interval:  422 QTC Calculation: 453 R Axis:   -50 Text Interpretation:  Sinus rhythm Multiple ventricular premature complexes Left bundle branch block No significant change was found Confirmed by Ezequiel Essex [phone]) on 04/04/2017 11:44:27 PM       Radiology Dg Chest 2 View  Result Date: 04/05/2017 CLINICAL DATA:  Shortness of breath EXAM: CHEST  2 VIEW COMPARISON:  03/04/2017 FINDINGS: Cardiomegaly. Hyperinflation. No focal consolidation or effusion. Left upper extremity catheter tip overlies the SVC. Tortuous aorta with calcification. No pneumothorax. Sclerotic lesion in the proximal left humerus unchanged. Coarse calcification left breast also unchanged. IMPRESSION: Cardiomegaly.  No edema or infiltrate. Electronically Signed   By: Donavan Foil M.D.   On: 04/05/2017 00:19    Procedures Procedures (including critical care time)  Medications Ordered in ED Medications - No data to display   Initial Impression / Assessment and Plan / ED Course  I have reviewed the triage vital signs and the nursing notes.  Pertinent labs & imaging results that were available during my care of the patient were reviewed by me and considered in my medical decision making (see chart for details).     Patient with history of combined systolic and diastolic heart failure and continuous milrinone infusion presenting with milrinone pump malfunction. Family states the pump has been turning off randomly over the past several days. Home health was  not able to order a new pump until tomorrow. Patient denies any complaints. No chest pain or shortness of breath. Son states she is more fatigued.  Patient denies any complaints. Labs are reassuring. Chest  x-ray is clear. No evidence of heart failure exacerbation.  Patient's milrinone placed on hospital pump. Her home pump will not be able to review replaced tonight. Son has been in contact with home health agency and will apparently be available tomorrow. We will admit for observation overnight given milrinone pump failure. D/w Dr. Neena Rhymes.   Final Clinical Impressions(s) / ED Diagnoses   Final diagnoses:  Systolic heart failure, unspecified HF chronicity ([place])    New Prescriptions New Prescriptions   No medications on file  I personally performed the services described in this documentation, which was scribed in my presence. The recorded information has been reviewed and is accurate.     Ezequiel Essex, MD 04/05/17 [phone]

## 2017-04-04 NOTE — ED Notes (Signed)
Delay in lab draw,   Provider at bedside.

## 2017-04-05 ENCOUNTER — Encounter (HOSPITAL_COMMUNITY): Payer: Self-pay | Admitting: Physician Assistant

## 2017-04-05 ENCOUNTER — Emergency Department (HOSPITAL_COMMUNITY): Payer: Medicare Other

## 2017-04-05 DIAGNOSIS — J9601 Acute respiratory failure with hypoxia: Secondary | ICD-10-CM | POA: Diagnosis not present

## 2017-04-05 DIAGNOSIS — R262 Difficulty in walking, not elsewhere classified: Secondary | ICD-10-CM | POA: Diagnosis not present

## 2017-04-05 DIAGNOSIS — I2583 Coronary atherosclerosis due to lipid rich plaque: Secondary | ICD-10-CM | POA: Diagnosis not present

## 2017-04-05 DIAGNOSIS — Z7901 Long term (current) use of anticoagulants: Secondary | ICD-10-CM | POA: Diagnosis not present

## 2017-04-05 DIAGNOSIS — I25110 Atherosclerotic heart disease of native coronary artery with unstable angina pectoris: Secondary | ICD-10-CM | POA: Diagnosis not present

## 2017-04-05 DIAGNOSIS — I110 Hypertensive heart disease with heart failure: Secondary | ICD-10-CM | POA: Diagnosis not present

## 2017-04-05 DIAGNOSIS — R41841 Cognitive communication deficit: Secondary | ICD-10-CM | POA: Diagnosis not present

## 2017-04-05 DIAGNOSIS — J9690 Respiratory failure, unspecified, unspecified whether with hypoxia or hypercapnia: Secondary | ICD-10-CM | POA: Diagnosis not present

## 2017-04-05 DIAGNOSIS — Z79899 Other long term (current) drug therapy: Secondary | ICD-10-CM | POA: Diagnosis not present

## 2017-04-05 DIAGNOSIS — R5383 Other fatigue: Secondary | ICD-10-CM | POA: Diagnosis not present

## 2017-04-05 DIAGNOSIS — E876 Hypokalemia: Secondary | ICD-10-CM | POA: Diagnosis not present

## 2017-04-05 DIAGNOSIS — E785 Hyperlipidemia, unspecified: Secondary | ICD-10-CM | POA: Diagnosis not present

## 2017-04-05 DIAGNOSIS — I429 Cardiomyopathy, unspecified: Secondary | ICD-10-CM | POA: Diagnosis not present

## 2017-04-05 DIAGNOSIS — I2510 Atherosclerotic heart disease of native coronary artery without angina pectoris: Secondary | ICD-10-CM | POA: Diagnosis not present

## 2017-04-05 DIAGNOSIS — I255 Ischemic cardiomyopathy: Secondary | ICD-10-CM | POA: Diagnosis not present

## 2017-04-05 DIAGNOSIS — I739 Peripheral vascular disease, unspecified: Secondary | ICD-10-CM | POA: Diagnosis not present

## 2017-04-05 DIAGNOSIS — Z9181 History of falling: Secondary | ICD-10-CM | POA: Diagnosis not present

## 2017-04-05 DIAGNOSIS — I5020 Unspecified systolic (congestive) heart failure: Secondary | ICD-10-CM | POA: Diagnosis present

## 2017-04-05 DIAGNOSIS — T85614A Breakdown (mechanical) of insulin pump, initial encounter: Secondary | ICD-10-CM | POA: Diagnosis not present

## 2017-04-05 DIAGNOSIS — R0602 Shortness of breath: Secondary | ICD-10-CM | POA: Diagnosis not present

## 2017-04-05 DIAGNOSIS — I481 Persistent atrial fibrillation: Secondary | ICD-10-CM | POA: Diagnosis not present

## 2017-04-05 DIAGNOSIS — S72002A Fracture of unspecified part of neck of left femur, initial encounter for closed fracture: Secondary | ICD-10-CM | POA: Diagnosis not present

## 2017-04-05 DIAGNOSIS — I4891 Unspecified atrial fibrillation: Secondary | ICD-10-CM | POA: Diagnosis not present

## 2017-04-05 DIAGNOSIS — M6281 Muscle weakness (generalized): Secondary | ICD-10-CM | POA: Diagnosis not present

## 2017-04-05 DIAGNOSIS — I5040 Unspecified combined systolic (congestive) and diastolic (congestive) heart failure: Secondary | ICD-10-CM | POA: Diagnosis not present

## 2017-04-05 DIAGNOSIS — S32302D Unspecified fracture of left ilium, subsequent encounter for fracture with routine healing: Secondary | ICD-10-CM | POA: Diagnosis not present

## 2017-04-05 DIAGNOSIS — I10 Essential (primary) hypertension: Secondary | ICD-10-CM | POA: Diagnosis not present

## 2017-04-05 DIAGNOSIS — E440 Moderate protein-calorie malnutrition: Secondary | ICD-10-CM | POA: Diagnosis not present

## 2017-04-05 DIAGNOSIS — D649 Anemia, unspecified: Secondary | ICD-10-CM | POA: Diagnosis not present

## 2017-04-05 DIAGNOSIS — F0390 Unspecified dementia without behavioral disturbance: Secondary | ICD-10-CM | POA: Diagnosis not present

## 2017-04-05 DIAGNOSIS — I5022 Chronic systolic (congestive) heart failure: Secondary | ICD-10-CM | POA: Diagnosis not present

## 2017-04-05 DIAGNOSIS — I480 Paroxysmal atrial fibrillation: Secondary | ICD-10-CM | POA: Diagnosis not present

## 2017-04-05 DIAGNOSIS — R4182 Altered mental status, unspecified: Secondary | ICD-10-CM | POA: Diagnosis not present

## 2017-04-05 LAB — BRAIN NATRIURETIC PEPTIDE: B Natriuretic Peptide: 412.3 pg/mL — ABNORMAL HIGH (ref 0.0–100.0)

## 2017-04-05 LAB — BASIC METABOLIC PANEL
Anion gap: 10 (ref 5–15)
Anion gap: 11 (ref 5–15)
BUN: 13 mg/dL (ref 6–20)
BUN: 16 mg/dL (ref 6–20)
CO2: 25 mmol/L (ref 22–32)
CO2: 28 mmol/L (ref 22–32)
Calcium: 8.5 mg/dL — ABNORMAL LOW (ref 8.9–10.3)
Calcium: 8.6 mg/dL — ABNORMAL LOW (ref 8.9–10.3)
Chloride: 103 mmol/L (ref 101–111)
Chloride: 103 mmol/L (ref 101–111)
Creatinine, Ser: 0.9 mg/dL (ref 0.44–1.00)
Creatinine, Ser: 0.91 mg/dL (ref 0.44–1.00)
GFR calc Af Amer: >60 mL/min (ref >60)
GFR calc Af Amer: >60 mL/min (ref >60)
GFR calc non Af Amer: 54 mL/min — ABNORMAL LOW (ref >60)
GFR calc non Af Amer: 54 mL/min — ABNORMAL LOW (ref >60)
Glucose, Bld: 85 mg/dL (ref 65–99)
Glucose, Bld: 88 mg/dL (ref 65–99)
Potassium: 3 mmol/L — ABNORMAL LOW (ref 3.5–5.1)
Potassium: 3.3 mmol/L — ABNORMAL LOW (ref 3.5–5.1)
Sodium: 139 mmol/L (ref 135–145)
Sodium: 141 mmol/L (ref 135–145)

## 2017-04-05 LAB — COOXEMETRY PANEL
Carboxyhemoglobin: 1.1 % (ref 0.5–1.5)
Methemoglobin: 1.2 % (ref 0.0–1.5)
O2 Saturation: 62.3 %
Total hemoglobin: 10.9 g/dL — ABNORMAL LOW (ref 12.0–16.0)

## 2017-04-05 LAB — TROPONIN I: Troponin I: <0.03 ng/mL (ref <0.03)

## 2017-04-05 LAB — MAGNESIUM: Magnesium: 2.1 mg/dL (ref 1.7–2.4)

## 2017-04-05 LAB — MRSA PCR SCREENING: MRSA by PCR: NEGATIVE

## 2017-04-05 MED ORDER — SODIUM CHLORIDE 0.9 % IV SOLN: 250.0000 mL | INTRAVENOUS | Status: DC | PRN

## 2017-04-05 MED ORDER — APIXABAN 2.5 MG PO TABS
2.5000 mg | ORAL_TABLET | Freq: Two times a day (BID) | ORAL | Status: DC
  Administered 2017-04-05: 2.5 mg via ORAL
  Filled 2017-04-05: qty 1

## 2017-04-05 MED ORDER — SODIUM CHLORIDE 0.9% FLUSH: 3.0000 mL | Freq: Two times a day (BID) | INTRAVENOUS | Status: DC

## 2017-04-05 MED ORDER — HYDROCODONE-ACETAMINOPHEN 5-325 MG PO TABS: 1.0000 | ORAL_TABLET | Freq: Four times a day (QID) | ORAL | Status: DC | PRN

## 2017-04-05 MED ORDER — TORSEMIDE 20 MG PO TABS
40.0000 mg | ORAL_TABLET | Freq: Two times a day (BID) | ORAL | Status: DC
  Administered 2017-04-05: 40 mg via ORAL
  Filled 2017-04-05 (×2): qty 2

## 2017-04-05 MED ORDER — OMEGA-3-ACID ETHYL ESTERS 1 G PO CAPS
1.0000 g | ORAL_CAPSULE | Freq: Every day | ORAL | Status: DC
  Administered 2017-04-05: 1 g via ORAL
  Filled 2017-04-05: qty 1

## 2017-04-05 MED ORDER — LISINOPRIL 2.5 MG PO TABS
2.5000 mg | ORAL_TABLET | Freq: Every day | ORAL | Status: DC
  Administered 2017-04-05: 2.5 mg via ORAL
  Filled 2017-04-05: qty 1

## 2017-04-05 MED ORDER — ENSURE ENLIVE PO LIQD: 237.0000 mL | Freq: Three times a day (TID) | ORAL | Status: DC

## 2017-04-05 MED ORDER — MILRINONE LACTATE IN DEXTROSE 20-5 MG/100ML-% IV SOLN
0.1250 ug/kg/min | INTRAVENOUS | Status: DC
Start: 2017-04-05 — End: 2017-04-05
  Administered 2017-04-05: 0.125 ug/kg/min via INTRAVENOUS
  Filled 2017-04-05: qty 100

## 2017-04-05 MED ORDER — ONDANSETRON HCL 4 MG/2ML IJ SOLN: 4.0000 mg | Freq: Four times a day (QID) | INTRAMUSCULAR | Status: DC | PRN

## 2017-04-05 MED ORDER — POTASSIUM CHLORIDE CRYS ER 20 MEQ PO TBCR
40.0000 meq | EXTENDED_RELEASE_TABLET | Freq: Once | ORAL | Status: AC
  Administered 2017-04-05: 40 meq via ORAL
  Filled 2017-04-05: qty 2

## 2017-04-05 MED ORDER — ACETAMINOPHEN 325 MG PO TABS: 650.0000 mg | ORAL_TABLET | ORAL | Status: DC | PRN

## 2017-04-05 MED ORDER — SODIUM CHLORIDE 0.9% FLUSH: 3.0000 mL | INTRAVENOUS | Status: DC | PRN

## 2017-04-05 MED ORDER — ADULT MULTIVITAMIN W/MINERALS CH
1.0000 | ORAL_TABLET | Freq: Every day | ORAL | Status: DC
  Administered 2017-04-05: 1 via ORAL
  Filled 2017-04-05: qty 1

## 2017-04-05 MED ORDER — AMIODARONE HCL 100 MG PO TABS
100.0000 mg | ORAL_TABLET | Freq: Every day | ORAL | Status: DC
  Administered 2017-04-05: 100 mg via ORAL
  Filled 2017-04-05: qty 1

## 2017-04-05 NOTE — H&P (Signed)
CARDIOLOGY H&P  HPI: 83 y.o. female w/ AF, AAA, mild dementia, CAD, iCMP (EF 30%) on home milrinone, HTN, HLD, and falls with hip fracture presenting with milrinone pump malfunction.   Note that the information below was gathered via the ED physician and chart review as, when I saw the patient in the ED, she was confused, disoriented, and unsure as to why she was brought to the hospital.   The patient family notes that her home milrinone pump has been malfunctioning lately, turning on and off by itself. Family has contacted home infusion company, which is working on replacing pump. This will be replaced at some point in time today, however no definitive time has been established. The patient has a left sided PICC which is functioning properly. She has otherwise had no new symptoms or complaints.    Review of Systems: - unable to obtain due to confusion, delirium  Past Medical History:  Diagnosis Date  . AAA (abdominal aortic aneurysm) ([place])   . Atrial fibrillation ([place])   . CHF (congestive heart failure) ([place])   . Chronic systolic heart failure ([place])    a. ECHO (04/2014) EF 20-25%, diff HK, mild MR  . Coronary artery disease 2007   moderate ASCAD of the left system s/p PCI of the D2 and PCI of the left circ 03/2009  . Hyperlipidemia   . Hypertension   . PVD (peripheral vascular disease) ([place])   . Renal artery stenosis ([place])   . Ventricular dysfunction    left; ischemic    No current facility-administered medications on file prior to encounter.    Current Outpatient Prescriptions on File Prior to Encounter  Medication Sig Dispense Refill  . amiodarone (PACERONE) 100 MG tablet Take 100 mg by mouth daily.    Marland Kitchen apixaban (ELIQUIS) 2.5 MG TABS tablet Take 1 tablet (2.5 mg total) by mouth 2 (two) times daily. 180 tablet 3  . busPIRone (BUSPAR) 5 MG tablet Take 1 tablet (5 mg total) by mouth daily as needed. 2 tablet 0  . feeding supplement, ENSURE ENLIVE, (ENSURE ENLIVE) LIQD Take 237  mLs by mouth 3 (three) times daily between meals. 237 mL 12  . HYDROcodone-acetaminophen (NORCO) 5-325 MG tablet Take 1-2 tablets by mouth every 6 (six) hours as needed. 90 tablet 0  . lisinopril (PRINIVIL,ZESTRIL) 2.5 MG tablet TAKE ONE TABLET BY MOUTH ONE TIME DAILY 90 tablet 3  . Multiple Vitamin (MULTIVITAMIN) tablet Take 1 tablet by mouth daily. 30 tablet 1  . Omega-3 Fatty Acids (FISH OIL) 1000 MG CAPS Take 1 capsule by mouth daily.    . sodium chloride 0.9 % SOLN with milrinone 1 MG/ML SOLN 200 mcg/mL Inject 0.125 mcg/kg/min into the vein continuous.    . torsemide (DEMADEX) 20 MG tablet TAKE TWO TABLETS BY MOUTH TWICE DAILY 120 tablet 3    Allergies  Allergen Reactions  . Codeine Nausea Only  . Garlic Nausea Only    Social History   Social History  . Marital status: Married    Spouse name: N/A  . Number of children: 1  . Years of education: N/A   Occupational History  . retired    Social History Main Topics  . Smoking status: Never Smoker  . Smokeless tobacco: Never Used  . Alcohol use Yes     Comment: occasionally  . Drug use: No  . Sexual activity: Not Currently   Other Topics Concern  . Not on file   Social History Narrative   She  lives in [place], family helps with her care.    Family History  Problem Relation Age of Onset  . Heart disease Mother   . Heart disease Father   . Heart disease Brother        x 3    PHYSICAL EXAM: Vitals:   04/05/17 0030 04/05/17 0045  BP:  109/68  Pulse:  64  Resp: 20 13  Temp:     General:  Well appearing. Confused HEENT: normal Neck: supple. JVP ~5cm H2O Cor: PMI nondisplaced. Irregularly irregular rhythm Lungs: clear Abdomen: soft, nontender, nondistended. No masses. Good bowel sounds. Extremities: no cyanosis, clubbing, rash, edema Neuro: confused, disoriented  ECG: NSR w/ PVCs, LBBB, repolarization abnormality  Results for orders placed or performed during the hospital encounter of 04/04/17 (from the  past 24 hour(s))  CBC with Differential/Platelet     Status: Abnormal   Collection Time: 04/04/17 11:31 PM  Result Value Ref Range   WBC 6.8 4.0 - 10.5 K/uL   RBC 3.83 (L) 3.87 - 5.11 MIL/uL   Hemoglobin 11.1 (L) 12.0 - 15.0 g/dL   HCT 34.7 (L) 36.0 - 46.0 %   MCV 90.6 78.0 - 100.0 fL   MCH 29.0 26.0 - 34.0 pg   MCHC 32.0 30.0 - 36.0 g/dL   RDW 15.7 (H) 11.5 - 15.5 %   Platelets 294 150 - 400 K/uL   Neutrophils Relative % 51 %   Neutro Abs 3.5 1.7 - 7.7 K/uL   Lymphocytes Relative 38 %   Lymphs Abs 2.5 0.7 - 4.0 K/uL   Monocytes Relative 8 %   Monocytes Absolute 0.6 0.1 - 1.0 K/uL   Eosinophils Relative 3 %   Eosinophils Absolute 0.2 0.0 - 0.7 K/uL   Basophils Relative 0 %   Basophils Absolute 0.0 0.0 - 0.1 K/uL  Basic metabolic panel     Status: Abnormal   Collection Time: 04/04/17 11:31 PM  Result Value Ref Range   Sodium 139 135 - 145 mmol/L   Potassium 3.0 (L) 3.5 - 5.1 mmol/L   Chloride 103 101 - 111 mmol/L   CO2 25 22 - 32 mmol/L   Glucose, Bld 88 65 - 99 mg/dL   BUN 16 6 - 20 mg/dL   Creatinine, Ser 0.90 0.44 - 1.00 mg/dL   Calcium 8.6 (L) 8.9 - 10.3 mg/dL   GFR calc non Af Amer 54 (L) >60 mL/min   GFR calc Af Amer >60 >60 mL/min   Anion gap 11 5 - 15  Troponin I     Status: None   Collection Time: 04/04/17 11:31 PM  Result Value Ref Range   Troponin I <0.03 <0.03 ng/mL  Brain natriuretic peptide     Status: Abnormal   Collection Time: 04/04/17 11:31 PM  Result Value Ref Range   B Natriuretic Peptide 412.3 (H) 0.0 - 100.0 pg/mL   Dg Chest 2 View  Result Date: 04/05/2017 CLINICAL DATA:  Shortness of breath EXAM: CHEST  2 VIEW COMPARISON:  03/04/2017 FINDINGS: Cardiomegaly. Hyperinflation. No focal consolidation or effusion. Left upper extremity catheter tip overlies the SVC. Tortuous aorta with calcification. No pneumothorax. Sclerotic lesion in the proximal left humerus unchanged. Coarse calcification left breast also unchanged. IMPRESSION: Cardiomegaly.  No  edema or infiltrate. Electronically Signed   By: Donavan Foil M.D.   On: 04/05/2017 00:19    ASSESSMENT: 83 y.o. female w/ AF, AAA, mild dementia, CAD, iCMP (EF 30%) on home milrinone, HTN, HLD, and falls with hip  fracture presenting with milrinone pump malfunction. The patient's pump will be replaced by her home infusion company today, however a time for this has not been established. Given this, the ED has requested that the patient be admitted to observation status while her pump replacement is sorted out. The patient has no evidence of acute decompensated heart failure and is euvolemic appearing on exam.   PLAN/DISCUSSION: - call home infusion company in AM to expedite pump change - 40 mEq PO K now then additional 40 in AM - continue milrinone 0.125 mcg/kg/min by PICC - continue all other home meds  Marcie Mowers, MD Cardiology fellow

## 2017-04-05 NOTE — Clinical Social Work Placement (Signed)
   CLINICAL SOCIAL WORK PLACEMENT  NOTE  Date:  04/05/2017  Patient Details  Name: Jacqueline Reilly MRN: [id number] Date of Birth: 11-Dec-1923  Clinical Social Work is seeking post-discharge placement for this patient at the [place] level of care (*CSW will initial, date and re-position this form in  chart as items are completed):  Yes   Patient/family provided with [facility] Work Department's list of facilities offering this level of care within the geographic area requested by the patient (or if unable, by the patient's family).  Yes   Patient/family informed of their freedom to choose among providers that offer the needed level of care, that participate in Medicare, Medicaid or managed care program needed by the patient, have an available bed and are willing to accept the patient.  Yes   Patient/family informed of [facility]'s ownership interest in [facility] and [facility], as well as of the fact that they are under no obligation to receive care at these facilities.  PASRR submitted to EDS on       PASRR number received on       Existing PASRR number confirmed on 04/05/17     FL2 transmitted to all facilities in geographic area requested by pt/family on       FL2 transmitted to all facilities within larger geographic area on       Patient informed that his/her managed care company has contracts with or will negotiate with certain facilities, including the following:            Patient/family informed of bed offers received.  Patient chooses bed at  (Pt from [facility])     Physician recommends and patient chooses bed at      Patient to be transferred to  (Pt from [facility]) on 04/05/17.  Patient to be transferred to facility by  [facility])     Patient family notified on 04/05/17 of transfer.  Name of family member notified:  Spouse at bedside.     PHYSICIAN       Additional Comment:     _______________________________________________ Serafina Mitchell, LCSWA 04/05/2017, 4:21 PM

## 2017-04-05 NOTE — Progress Notes (Signed)
Patient was recently discharged. RN called report to [facility]. Discharge paperwork given to [facility] to be  transported with Patient. Patient's new milirinone pump was connected to patient and transported with patient.

## 2017-04-05 NOTE — Care Management Note (Addendum)
Case Management Note  Patient Details  Name: JODEAN VALADE MRN: [id number] Date of Birth: 1924-01-19  Subjective/Objective:    Pt admitted due to malfunction of IV pump (milrinone) at SNF                Action/Plan:    Addendum - CM unable to reach NP, however charge nurse in contact with NP and has made NP aware of plan - charge nurse will contact NP  once IV equipment is delivered.  PTA from [facility].  [facility] confirmed that agency will provide working equipment by 6 pm tonight here at [facility] - new orders are not needed for milrinone nor equipment for services to resume.    CSW made aware and will prearrange transport back to facility around 7pm tonight.  Charge nurse aware and will help facilitate discharge back to facility once pt has properly working pump.  CM text paged attending to inform of scheduled service and need for discharge orders - awaiting call back   Expected Discharge Date:                  Expected Discharge Plan:  [place]  In-House Referral:  Clinical Social Work  Discharge planning Services  CM Consult  Post Acute Care Choice:    Choice offered to:     DME Arranged:    DME Agency:     HH Arranged:    [place] Agency:     Status of Service:     If discussed at [employer], dates discussed:    Additional Comments:  Maryclare Labrador, RN 04/05/2017, 1:08 PM

## 2017-04-05 NOTE — Care Management (Signed)
CM contacted [facility] to request assessment/fix to IV pump.  [facility] to follow back up with CM

## 2017-04-05 NOTE — Progress Notes (Signed)
Patient Name: Jacqueline Reilly Date of Encounter: 04/05/2017  Primary Cardiologist: [facility] Problem List     Principal Problem:   Heart failure with reduced ejection fraction [facility]) Active Problems:   Receiving inotropic medication   Hypokalemia   Coronary artery disease   Ischemic dilated cardiomyopathy (HCC)   Hypertension   Physical deconditioning   Atrial fibrillation (HCC)     Subjective   Admitted overnight for possible milrinone pump malfunction. History is taken from prior documentation as the patient is confused this morning and there are no family members present at this time.   Patient's son apparently tried to "crimp" the line of her milrinone infusion to evaluate if she would be able to hear the alarm, which reportedly never went off. SOn reported the [facility] evaluated the pump and was informed it had been turning off q 10 mintes for several minutes at a time x 4 days prior to her presentation to the hospital. Son has reportedly contacted the [facility] agency for replace, which will apparently be ready today. She continues to reside [employer] [place].  Since her admission she has been continued on home doses of torsemide, amiodarone, Eliquis, and lisinopril. Vitals and weight have remained stable. Labs show stable renal function with a SCr of 0.90 and hypokalemia with K+ 3.0-->3.3. BNP mildly elevated at 412. Troponin <0.03. WBC 6.8, hgb 11.1, plt 294.   Inpatient Medications    Scheduled Meds: . amiodarone  100 mg Oral Daily  . apixaban  2.5 mg Oral BID  . feeding supplement (ENSURE ENLIVE)  237 mL Oral TID BM  . lisinopril  2.5 mg Oral Daily  . multivitamin with minerals  1 tablet Oral Daily  . omega-3 acid ethyl esters  1 g Oral Daily  . potassium chloride  40 mEq Oral Once  . sodium chloride flush  3 mL Intravenous Q12H  . torsemide  40 mg Oral BID   Continuous Infusions: . sodium chloride    . milrinone 0.125 mcg/kg/min (04/05/17 0034)    PRN Meds: sodium chloride, acetaminophen, HYDROcodone-acetaminophen, ondansetron (ZOFRAN) IV, sodium chloride flush   Vital Signs    Vitals:   04/05/17 0100 04/05/17 0115 04/05/17 0211 04/05/17 0514  BP: (!) 101/58 116/68 116/61 106/63  Pulse: 61 67  66  Resp: (!) 21 18  15   Temp:   97.5 F (36.4 C) 98.4 F (36.9 C)  TempSrc:   Axillary   SpO2: 95% 100%  97%  Weight:   91 lb 12.8 oz (41.6 kg)   Height:   4\' 11"  (1.499 m)     Intake/Output Summary (Last 24 hours) at 04/05/17 0814 Last data filed at 04/05/17 0514  Gross per 24 hour  Intake               32 ml  Output              100 ml  Net              -68 ml   Filed Weights   04/04/17 2307 04/05/17 0211  Weight: 88 lb (39.9 kg) 91 lb 12.8 oz (41.6 kg)    Physical Exam    GEN: Frail appearing, in no acute distress, confused, asks to go home.  HEENT: Grossly normal.  Neck: Supple, JVD elevated ~ 5 cm, no carotid bruits, or masses. Cardiac: Irregular, no murmurs, rubs, or gallops. No clubbing, cyanosis, edema.  Radials/DP/PT 2+ and equal bilaterally.  Respiratory:  Respirations regular and unlabored, clear to auscultation bilaterally. GI: Soft, nontender, nondistended, BS + x 4. MS: No deformity. Skin: Warm and dry, no rash. Neuro:  Strength and sensation are intact. Psych: Alert, somewhat confused. Normal affect.  Labs    CBC  Recent Labs  04/04/17 2331  WBC 6.8  NEUTROABS 3.5  HGB 11.1*  HCT 34.7*  MCV 90.6  PLT 496   Basic Metabolic Panel  Recent Labs  04/04/17 2331 04/05/17 0543  NA 139 141  K 3.0* 3.3*  CL 103 103  CO2 25 28  GLUCOSE 88 85  BUN 16 13  CREATININE 0.90 0.91  CALCIUM 8.6* 8.5*   Liver Function Tests No results for input(s): AST, ALT, ALKPHOS, BILITOT, PROT, ALBUMIN in the last 72 hours. No results for input(s): LIPASE, AMYLASE in the last 72 hours. Cardiac Enzymes  Recent Labs  04/04/17 2331  TROPONINI <0.03   BNP Invalid input(s): POCBNP D-Dimer No results for  input(s): DDIMER in the last 72 hours. Hemoglobin A1C No results for input(s): HGBA1C in the last 72 hours. Fasting Lipid Panel No results for input(s): CHOL, HDL, LDLCALC, TRIG, CHOLHDL, LDLDIRECT in the last 72 hours. Thyroid Function Tests No results for input(s): TSH, T4TOTAL, T3FREE, THYROIDAB in the last 72 hours.  Invalid input(s): FREET3  Telemetry    NSR, 60s bpm, frequent PVCs, short run of atrial tach around midnight, LBBB - Personally Reviewed  ECG    NSR, 69 bpm, occasional PVCs, LBBB (old), early repolarization - Personally Reviewed  Radiology    Dg Chest 2 View  Result Date: 04/05/2017 IMPRESSION: Cardiomegaly.  No edema or infiltrate. Electronically Signed   By: Donavan Foil M.D.   On: 04/05/2017 00:19    Cardiac Studies   TTE 12/25/2016: Study Conclusions  - Left ventricle: The cavity size was normal. Wall thickness was   normal. Systolic function was moderately to severely reduced. The   estimated ejection fraction was in the range of 30% to 35%.   Doppler parameters are consistent with abnormal left ventricular   relaxation (grade 1 diastolic dysfunction). - Aortic valve: Mildly calcified annulus. There was very mild   stenosis. There was trivial regurgitation. Valve area (VTI): 0.88   cm^2. Valve area (Vmax): 0.9 cm^2. Valve area (Vmean): 0.82 cm^2.  Patient Profile     83 y.o. female with history of CAD s.p prior cutting balloon angioplasty to the D2 in 2007 and PCI/DES to the mic MCx in 2010, end-stage systolic CHF on milrinone gtt for the past 3 years with LV dysfunction out of proportion to her CAD with last echo in 11/2016 showing severe LV dilation with EF of 30-35%, PAF on Eliquis 2.5 mg bid (age and weight), AAA, HTN, HLD, RAS, and mild dementia who presented to [facility] overnight into 6/9 with reportedly milrinone pump malfunction and was admitted for observation while a new milrinone pump is being acquired prior to her return to SNF.   Assessment &  Plan    1. End-stage systolic CHF: -She does not appear grossly volume overloaded at this time -Weight this morning of 91 pounds with a last clinic weight of 88 pounds on 5/25 -Has been placed on hospital pump for her milrinone infusion while admitted -Apparently, the son has contacted the [facility] agency to acquire a new milrinone pump which will reportedly be available today -For now, continue milrinone infusion at home dose with hospital pump -I have reached out to Advanced Heart Failure MD to expedite this process  -  Will consult CM to assist in this process -Continue torsemide 40 mg bid with KCl repletion  -Continue lisinopril 2.5 mg daily -Not on beta blocker/spiro given soft BP -Recent TTE as above, no indication to repeat at this time  2. PAF: -Currently in sinus rhythm with heart rates in the 60s bpm with frequent PVCs which is not new for her -Recommend repletion of potassium -Will check magnesium -Continue amiodarone 100 mg daily -Continue ELiquis 2.5 mg bid (age and weight) -CHADS2VASc at least 6 (CHF, HTN, age x 2, vascular disease, sex category)  3. CAD as above: -No symptoms concerning for ischemia as above -Troponin negative -On Eliquis in place of ASA -Recent TTE as above -No plans for inpatient ischemic evaluation at this time given lack of symptoms, advanced age, and comorbidities   4. Hypokalemia: -Replete to goal of at least 4.0 -Check magnesium as above and replete to goal of at least 2.0 as indicated   5. AAA: -Vitals stable -Monitor  6. HTN: -Well controlled -Continue current medications  7. Mild dementia: -Appears somewhat confused this morning, her baseline is unknown to me -Has been living at [facility] [place] since her recent admission in early May for fall leading to fractures hip s/p arthroplasty   8. Dispo: -Await new milrinone pump prior to discharge, discuss with HF MD and consult case manager to assist with this process -Son has  moved to [place] from [place] to assist with patient -Overall this appears to be a difficult situation with the patient's family dynamics    Signed, Marcille Blanco [place] Pager: [phone] 04/05/2017, 8:14 AM   Patient examined chart reviewed. Mild confusion JVP elevated lungs clear soft MR murmur Plus one LE edema. On home inotropes Can be d/c back to home with [facility] RN if new pump Can be delivered. Overall volume status is ok  Jenkins Rouge

## 2017-04-05 NOTE — Discharge Summary (Signed)
Discharge Summary    Patient ID: Jacqueline Reilly  MRN: [id number], DOB/AGE: [birth year] 83 y.o.  Admit Date: 04/04/2017 Discharge Date: 04/05/2017  Primary Care Provider: [name], MD Primary Cardiologist: Dr. Haroldine Laws, MD  Discharge Diagnoses    Principal Problem:   Heart failure with reduced ejection fraction [facility]) Active Problems:   Coronary artery disease   Ischemic dilated cardiomyopathy ([place])   Hypertension   Physical deconditioning   Atrial fibrillation (HCC)   Receiving inotropic medication   Hypokalemia   Allergies Allergies  Allergen Reactions  . Codeine Nausea Only  . Garlic Nausea Only     History of Present Illness     83 year old female with CAD s/p prior cutting balloon angioplasty to the D2 in 2007 and PCI/DES to the mid LCx in 2010, end-stage systolic CHF on milrinone gtt for the past 3 years with LV dysfunction out of proportion to her CAD with last echo in 11/2016 showing severe LV dilation with EF of 30-35%, PAF on Eliquis 2.5 mg bid (age and weight), AAA, HTN, HLD, RAS, and mild dementia who presented to [facility] overnight into 6/9 with reportedly milrinone pump malfunction and was admitted for observation while a new milrinone pump is being acquired prior to her return to SNF.   Hospital Course     Consultants: case management   Since her admission, she has not had any acute issues and has remained in sinus rhythm with frequent PVCs on telemetry. She was noted to be mildly hypokalemic which was repleted. Magnesium was checked and found to be at goal. Case management has been consulted and has been in touch with [facility] to provide new pump. We have gotten in touch with her primary HF MD who recommended with touch base with [facility] regarding this patient's difficult home situation. There was some delay in getting her pump replaced but this was eventually completed and she will be discharged home in good condition.   The patient has been seen by Dr. Johnsie Cancel, MD and  felt to be stable for discharge today. All follow up appointments have been sent to the office for scheduling. Discharge medications are listed below. Prescriptions have been reviewed with the patient and sent in to their pharmacy as needed.  _____________  Discharge Vitals Blood pressure 112/70, pulse 80, temperature 98.2 F (36.8 C), temperature source Oral, resp. rate 18, height 4\' 11"  (1.499 m), weight 91 lb 12.8 oz (41.6 kg), SpO2 98 %.  Filed Weights   04/04/17 2307 04/05/17 0211  Weight: 88 lb (39.9 kg) 91 lb 12.8 oz (41.6 kg)    Labs & Radiologic Studies    CBC  Recent Labs  04/04/17 2331  WBC 6.8  NEUTROABS 3.5  HGB 11.1*  HCT 34.7*  MCV 90.6  PLT 240   Basic Metabolic Panel  Recent Labs  04/04/17 2331 04/05/17 0543 04/05/17 0829  NA 139 141  --   K 3.0* 3.3*  --   CL 103 103  --   CO2 25 28  --   GLUCOSE 88 85  --   BUN 16 13  --   CREATININE 0.90 0.91  --   CALCIUM 8.6* 8.5*  --   MG  --   --  2.1   Liver Function Tests No results for input(s): AST, ALT, ALKPHOS, BILITOT, PROT, ALBUMIN in the last 72 hours. No results for input(s): LIPASE, AMYLASE in the last 72 hours. Cardiac Enzymes  Recent Labs  04/04/17 2331  TROPONINI <  0.03   BNP Invalid input(s): POCBNP D-Dimer No results for input(s): DDIMER in the last 72 hours. Hemoglobin A1C No results for input(s): HGBA1C in the last 72 hours. Fasting Lipid Panel No results for input(s): CHOL, HDL, LDLCALC, TRIG, CHOLHDL, LDLDIRECT in the last 72 hours. Thyroid Function Tests No results for input(s): TSH, T4TOTAL, T3FREE, THYROIDAB in the last 72 hours.  Invalid input(s): FREET3 _____________  Dg Chest 2 View  Result Date: 04/05/2017 CLINICAL DATA:  Shortness of breath EXAM: CHEST  2 VIEW COMPARISON:  03/04/2017 FINDINGS: Cardiomegaly. Hyperinflation. No focal consolidation or effusion. Left upper extremity catheter tip overlies the SVC. Tortuous aorta with calcification. No pneumothorax.  Sclerotic lesion in the proximal left humerus unchanged. Coarse calcification left breast also unchanged. IMPRESSION: Cardiomegaly.  No edema or infiltrate. Electronically Signed   By: Donavan Foil M.D.   On: 04/05/2017 00:19   Xr Hip Unilat W Or W/o Pelvis 2-3 Views Left  Result Date: 03/18/2017 Stable hip replacement   Diagnostic Studies/Procedures   None. _____________  Disposition   Pt is being discharged home today in good condition.  Follow-up Plans & Appointments     Contact information for follow-up providers    [facility].   Specialty:  Cardiology Why:  A staff message has been sent to our staff to contact the patient for follow up in 7-10 days. Please contact the office if you have not heard from our office by 04/09/17. Contact information: [address] [phone] [place] [phone]           Contact information for after-discharge care    Destination    [facility] .   Specialty:  [place] information: [address] [place] [phone]                   Discharge Medications   Current Discharge Medication List    CONTINUE these medications which have NOT CHANGED   Details  acetaminophen (TYLENOL) 500 MG tablet Take 1,000 mg by mouth 3 (three) times daily.    amiodarone (PACERONE) 100 MG tablet Take 100 mg by mouth daily.    apixaban (ELIQUIS) 2.5 MG TABS tablet Take 1 tablet (2.5 mg total) by mouth 2 (two) times daily. Qty: 180 tablet, Refills: 3    busPIRone (BUSPAR) 5 MG tablet Take 1 tablet (5 mg total) by mouth daily as needed. Qty: 2 tablet, Refills: 0    docusate sodium (COLACE) 100 MG capsule Take 100 mg by mouth 2 (two) times daily.    feeding supplement, ENSURE ENLIVE, (ENSURE ENLIVE) LIQD Take 237 mLs by mouth 3 (three) times daily between meals. Qty:  237 mL, Refills: 12    lisinopril (PRINIVIL,ZESTRIL) 2.5 MG tablet TAKE ONE TABLET BY MOUTH ONE TIME DAILY Qty: 90 tablet, Refills: 3    MILRINONE LACTATE IV Inject 1.25 mcg into the vein 3 (three) times daily.    Multiple Vitamin (MULTIVITAMIN) tablet Take 1 tablet by mouth daily. Qty: 30 tablet, Refills: 1    Omega-3 Fatty Acids (FISH OIL) 1000 MG CAPS Take 1 capsule by mouth daily.    torsemide (DEMADEX) 20 MG tablet TAKE TWO TABLETS BY MOUTH TWICE DAILY Qty: 120 tablet, Refills: 3    traMADol (ULTRAM) 50 MG tablet Take 25 mg by mouth every 8 (eight) hours as needed for moderate pain.      STOP taking  these medications     HYDROcodone-acetaminophen (NORCO) 5-325 MG tablet          Aspirin prescribed at discharge?  No: on Eliquis in place High Intensity Statin Prescribed? (Lipitor 40-80mg  or Crestor 20-40mg ): No:  Beta Blocker Prescribed? No: milrinone gtt For EF <40%, was ACEI/ARB Prescribed? Yes ADP Receptor Inhibitor Prescribed? (i.e. Plavix etc.-Includes Medically Managed Patients): No: n/a For EF <40%, Aldosterone Inhibitor Prescribed? No:  Was EF assessed during THIS hospitalization? No: recent assessment as above Was Cardiac Rehab II ordered? (Included Medically managed Patients): No:    Outstanding Labs/Studies   Will need follow up bmet in 7 days.   Duration of Discharge Encounter   Greater than 30 minutes including physician time.  Signed, Rise Mu, PA-C [facility] Pager: [phone] 04/05/2017, 5:01 PM

## 2017-04-05 NOTE — Progress Notes (Signed)
MEDICATION RELATED CONSULT NOTE - INITIAL   Pharmacy Consult for Continuation of Milrinone  Indication: Chronic Systolic HF  Allergies  Allergen Reactions  . Codeine Nausea Only  . Garlic Nausea Only    Patient Measurements: Height: 5' (152.4 cm) Weight: 88 lb (39.9 kg) IBW/kg (Calculated) : 45.5  Vital Signs: Temp: 98.5 F (36.9 C) (06/08 2325) Temp Source: Oral (06/08 2325) BP: 109/67 (06/09 0015) Pulse Rate: 67 (06/08 2330) Intake/Output from previous day: No intake/output data recorded. Intake/Output from this shift: No intake/output data recorded.  Labs:  Recent Labs  04/04/17 2331  WBC 6.8  HGB 11.1*  HCT 34.7*  PLT 294  CREATININE 0.90   Estimated Creatinine Clearance: 25.6 mL/min (by C-G formula based on SCr of 0.9 mg/dL).    Medical History: Past Medical History:  Diagnosis Date  . AAA (abdominal aortic aneurysm) ([place])   . Atrial fibrillation ([place])   . CHF (congestive heart failure) ([place])   . Chronic systolic heart failure ([place])    a. ECHO (04/2014) EF 20-25%, diff HK, mild MR  . Coronary artery disease 2007   moderate ASCAD of the left system s/p PCI of the D2 and PCI of the left circ 03/2009  . Hyperlipidemia   . Hypertension   . PVD (peripheral vascular disease) ([place])   . Renal artery stenosis ([place])   . Ventricular dysfunction    left; ischemic    Assessment: 83 y/o F maintained on outpatient milrinone therapy, currently resides at a nursing facility, having milrinone pump issues for several days at facility, now presents to ED to be place on milrinone until new pump can arrive.   Current milrinone dose per pump label: Dose: 0.125 mcg/kg/min Rate: 1.5 mL/hr  Plan:  -Cont milrinone at 0.125 mcg/kg/min -Close vital monitoring   Narda Bonds 04/05/2017,12:43 AM

## 2017-04-05 NOTE — Clinical Social Work Note (Addendum)
Clinical Social Worker facilitated patient discharge including contacting patient family (Spouse at bedside) and facility ([facility])  to confirm patient discharge plans. Pt will be transported after [facility] nurse correct's pt's pump issues with milrinone drip. CSW arranged ambulance transport via [facility] to [facility] at [place] to call report prior to discharge.  Clinical Social Worker will sign off for now as social work intervention is no longer needed. Please consult [place] again if new need arises.  [name] B. Joline Maxcy Clinical Social Work Dept Weekend Social Worker [phone] 4:20 PM

## 2017-04-05 NOTE — Progress Notes (Signed)
Pt refusing tele monitoring. PA made aware. Will cont to monitor pt.

## 2017-04-07 ENCOUNTER — Encounter: Payer: Self-pay | Admitting: Adult Health

## 2017-04-07 ENCOUNTER — Non-Acute Institutional Stay (SKILLED_NURSING_FACILITY): Payer: Medicare Other | Admitting: Adult Health

## 2017-04-07 DIAGNOSIS — I5022 Chronic systolic (congestive) heart failure: Secondary | ICD-10-CM | POA: Diagnosis not present

## 2017-04-07 DIAGNOSIS — S72002A Fracture of unspecified part of neck of left femur, initial encounter for closed fracture: Secondary | ICD-10-CM

## 2017-04-07 DIAGNOSIS — D649 Anemia, unspecified: Secondary | ICD-10-CM | POA: Diagnosis not present

## 2017-04-07 DIAGNOSIS — Z79899 Other long term (current) drug therapy: Secondary | ICD-10-CM | POA: Diagnosis not present

## 2017-04-07 DIAGNOSIS — I4891 Unspecified atrial fibrillation: Secondary | ICD-10-CM

## 2017-04-07 NOTE — Progress Notes (Signed)
Location:   [place] Room Number: 110 A Place of Service:  SNF (31)    CODE STATUS: Full Code  Allergies  Allergen Reactions  . Codeine Nausea Only  . Garlic Nausea Only    Chief Complaint  Patient presents with  . Discharge Note    Discharging Home with Hospice Care    HPI:  She is being discharged to home with hospice care.  Hospice will provide any necessary dme and home health needs. She will need her prescriptions to be written and will follow up with hospice care for her medical needs.  She had been hospitalized for a left femoral head fracture. She was admitted to this facility for short term rehab; her family has decided to have her return home under hospice care.   Past Medical History:  Diagnosis Date  . AAA (abdominal aortic aneurysm) ([place])   . Atrial fibrillation ([place])   . Chronic systolic heart failure ([place])    a. ECHO (04/2014) EF 20-25%, diff HK, mild MR  . Coronary artery disease 2007   moderate ASCAD of the left system s/p PCI of the D2 and PCI of the left circ 03/2009  . Hyperlipidemia   . Hypertension   . PVD (peripheral vascular disease) ([place])   . Renal artery stenosis ([place])   . Ventricular dysfunction    left; ischemic    Past Surgical History:  Procedure Laterality Date  . ANGIOPLASTY    . ANTERIOR APPROACH HEMI HIP ARTHROPLASTY Left 03/05/2017   Procedure: ANTERIOR APPROACH LEFT HIP HEMI ARTHROPLASTY;  Surgeon: Leandrew Koyanagi, MD;  Location: [facility];  Service: Orthopedics;  Laterality: Left;  . CORONARY STENT PLACEMENT    . IR GENERIC HISTORICAL  09/25/2016   IR FLUORO GUIDE CV LINE RIGHT 09/25/2016 Ardis Rowan, PA-C [facility]  . retinal cryopexy     right eye (for retinal detachment)    Social History   Social History  . Marital status: Married    Spouse name: N/A  . Number of children: 1  . Years of education: N/A   Occupational History  . retired    Social History Main Topics  . Smoking status: Never  Smoker  . Smokeless tobacco: Never Used  . Alcohol use Yes     Comment: occasionally  . Drug use: No  . Sexual activity: Not Currently   Other Topics Concern  . Not on file   Social History Narrative   She lives in [place], family helps with her care.   Family History  Problem Relation Age of Onset  . Heart disease Mother   . Heart disease Father   . Heart disease Brother        x 3    VITAL SIGNS BP (!) 94/59   Pulse 65   Temp 98.7 F (37.1 C)   Resp 18   Ht 4\' 9"  (1.448 m)   Wt 89 lb 12.8 oz (40.7 kg)   SpO2 97%   BMI 19.43 kg/m   Patient's Medications  New Prescriptions   No medications on file  Previous Medications   ACETAMINOPHEN (TYLENOL) 500 MG TABLET    Take 1,000 mg by mouth 3 (three) times daily.   AMIODARONE (PACERONE) 100 MG TABLET    Take 100 mg by mouth daily.   APIXABAN (ELIQUIS) 2.5 MG TABS TABLET    Take 1 tablet (2.5 mg total) by mouth 2 (two) times daily.   BUSPIRONE (BUSPAR) 5 MG TABLET  Take 5 mg by mouth daily as needed.   DOCUSATE SODIUM (COLACE) 100 MG CAPSULE    Take 100 mg by mouth 2 (two) times daily.   LISINOPRIL (PRINIVIL,ZESTRIL) 2.5 MG TABLET    TAKE ONE TABLET BY MOUTH ONE TIME DAILY   MILRINONE LACTATE IV    Inject 1.25 mcg into the vein 3 (three) times daily.   MULTIPLE VITAMIN (MULTIVITAMIN) TABLET    Take 1 tablet by mouth daily.   NUTRITIONAL SUPPLEMENT LIQD    Take by mouth. House 2.0 - Give 120cc by mouth three times a day   OMEGA-3 FATTY ACIDS (FISH OIL) 1000 MG CAPS    Take 1 capsule by mouth daily.   TORSEMIDE (DEMADEX) 20 MG TABLET    TAKE TWO TABLETS BY MOUTH TWICE DAILY   TRAMADOL (ULTRAM) 50 MG TABLET    Take 25 mg by mouth every 8 (eight) hours as needed for moderate pain.  Modified Medications   No medications on file  Discontinued Medications   BUSPIRONE (BUSPAR) 5 MG TABLET    Take 1 tablet (5 mg total) by mouth daily as needed.   FEEDING SUPPLEMENT, ENSURE ENLIVE, (ENSURE ENLIVE) LIQD    Take 237 mLs by mouth  3 (three) times daily between meals.     SIGNIFICANT DIAGNOSTIC EXAMS  12-25-16: TEE: - Left ventricle: The cavity size was normal. Wall thickness was normal. Systolic function was moderately to severely reduced. The estimated ejection fraction was in the range of 30% to 35%. Doppler parameters are consistent with abnormal left ventricular relaxation (grade 1 diastolic dysfunction). - Aortic valve: Mildly calcified annulus. There was very mild stenosis. There was trivial regurgitation.   03-04-17: pelvic x-ray: Acute left hip subcapital femoral neck fracture. Atherosclerosis  03-04-17: left femur x-ray: Left hip joint is excluded from the field of view and assessed of left hip x-rays performed same day. Visualized left femur demonstrates no acute fracture or dislocation.   03-04-17: chest x-ray: Cardiomegaly. Tortuous aorta. Central pulmonary vascular prominence without pulmonary edema. Bilateral shoulder joint degenerative changes.   03-04-17: ct of head and cervical spine: No skull fracture or intracranial hemorrhage. No cervical spine fracture or abnormal prevertebral soft tissue swelling.  LABS REVIEWED:   03-04-17: wbc 18.3; hgb 13.2; hct 39.8 ;mcv 88.8; plt 272; glucose 132; bun 20; creat 0.99; k+ 3.6; na++ 140; liver normal albumin 4.2; BNP 69.6; tsh 0.394 03-06-17; wbc 8.6; hgb 10.2; hct 31.8; mcv 89.1; plt 200; glucose 162; bun 18; creat 1.13; k+ 3.1; na++ 138 03-08-17: wbc 7.9; hgb 9.4; hct 29.5; mcv 89.4; plt 220; glucose 98; bun 14; creat 0.84; k+ 4.4; na++ 138   Review of Systems  Constitutional: Negative for malaise/fatigue.  Respiratory: Negative for cough and shortness of breath.   Cardiovascular: Negative for chest pain, palpitations and leg swelling.  Gastrointestinal: Negative for abdominal pain, constipation and heartburn.  Musculoskeletal: Positive for joint pain. Negative for back pain and myalgias.       Has left hip pain is managed    Skin: Negative.   Neurological:  Negative for dizziness.  Psychiatric/Behavioral: The patient is not nervous/anxious.     Physical Exam  Constitutional: No distress.  Frail   Eyes: Conjunctivae are normal.  Neck: Neck supple. No JVD present. No thyromegaly present.  Cardiovascular: Normal rate, regular rhythm and intact distal pulses.   Respiratory: Effort normal and breath sounds normal. No respiratory distress. She has no wheezes.  GI: Soft. Bowel sounds are normal. She exhibits no  distension. There is no tenderness.  Musculoskeletal: She exhibits no edema.  Able to move all extremities  Is status post left femoral head fracture   Lymphadenopathy:    She has no cervical adenopathy.  Neurological: She is alert.  Skin: Skin is warm and dry. She is not diaphoretic.  picc line right arm   Psychiatric: She has a normal mood and affect.     ASSESSMENT/ PLAN:  Patient is being discharged with the following home health services:  With hospice care.   Patient is being discharged with the following durable medical equipment:  Hospice will provide  Patient has been advised to f/u with their PCP in 1-2 weeks to bring them up to date on their rehab stay.  Social services at facility was responsible for arranging this appointment.  Pt was provided with a 30 day supply of prescriptions for medications and refills must be obtained from their PCP.  For controlled substances, a more limited supply may be provided adequate until PCP appointment only. #30 ultram 50 mg tabs   Time spent with patient 40    minutes >50% time spent counseling; reviewing medical record; tests; labs; and developing future plan of care    Ok Edwards NP [facility]  Contact [phone] Monday through Friday 8am- 5pm  After hours call [phone]

## 2017-04-08 ENCOUNTER — Encounter: Payer: Self-pay | Admitting: Internal Medicine

## 2017-04-08 NOTE — Progress Notes (Signed)
Patient ID: Jacqueline Reilly, female   DOB: 1924-08-17, 83 y.o.   MRN: [id number]    HISTORY AND PHYSICAL   DATE: 04/08/2017 Location:    [facility] Room Number: 110 A Place of Service: SNF (31)   Extended Emergency Contact Information Primary Emergency Contact: Conan Bowens Address: [address] [place] [address] [place] Phone: [phone] Mobile Phone: [phone] Relation: Son Secondary Emergency Contact: Darron Doom Address: [address]          [place], [place] [address] Johnnette Litter of [place] Phone: [phone] Mobile Phone: [phone] Relation: Spouse  Advanced Directive information Does Patient Have a Medical Advance Directive?: No, Would patient like information on creating a medical advance directive?: No - Patient declined  Chief Complaint  Patient presents with  . Readmit To SNF    REadmit    HPI:    Past Medical History:  Diagnosis Date  . AAA (abdominal aortic aneurysm) ([place])   . Atrial fibrillation ([place])   . Chronic systolic heart failure ([place])    a. ECHO (04/2014) EF 20-25%, diff HK, mild MR  . Coronary artery disease 2007   moderate ASCAD of the left system s/p PCI of the D2 and PCI of the left circ 03/2009  . Hyperlipidemia   . Hypertension   . PVD (peripheral vascular disease) ([place])   . Renal artery stenosis ([place])   . Ventricular dysfunction    left; ischemic    Past Surgical History:  Procedure Laterality Date  . ANGIOPLASTY    . ANTERIOR APPROACH HEMI HIP ARTHROPLASTY Left 03/05/2017   Procedure: ANTERIOR APPROACH LEFT HIP HEMI ARTHROPLASTY;  Surgeon: Leandrew Koyanagi, MD;  Location: [facility];  Service: Orthopedics;  Laterality: Left;  . CORONARY STENT PLACEMENT    . IR GENERIC HISTORICAL  09/25/2016   IR FLUORO GUIDE CV LINE RIGHT 09/25/2016 Ardis Rowan, PA-C [facility]  . retinal cryopexy     right eye (for retinal detachment)    Patient Care  Team: [name], MD as PCP - General (Internal Medicine) Geri Seminole ([place])  Social History   Social History  . Marital status: Married    Spouse name: N/A  . Number of children: 1  . Years of education: N/A   Occupational History  . retired    Social History Main Topics  . Smoking status: Never Smoker  . Smokeless tobacco: Never Used  . Alcohol use Yes     Comment: occasionally  . Drug use: No  . Sexual activity: Not Currently   Other Topics Concern  . Not on file   Social History Narrative   She lives in [place], family helps with her care.     reports that she has never smoked. She has never used smokeless tobacco. She reports that she drinks alcohol. She reports that she does not use drugs.  Family History  Problem Relation Age of Onset  . Heart disease Mother   . Heart disease Father   . Heart disease Brother        x 3   Family Status  Relation Status  . Mother Deceased  . Father Deceased  . Brother (Not Specified)    Immunization History  Administered Date(s) Administered  . Tdap 06/17/2014    Allergies  Allergen Reactions  . Codeine Nausea Only  . Garlic Nausea Only    Medications: Patient's Medications  New Prescriptions   No medications on file  Previous Medications   ACETAMINOPHEN (TYLENOL) 500 MG TABLET    Take 1,000 mg by mouth 3 (three) times daily.   AMIODARONE (PACERONE) 100 MG TABLET    Take 100 mg by mouth daily.   APIXABAN (ELIQUIS) 2.5 MG TABS TABLET    Take 1 tablet (2.5 mg total) by mouth 2 (two) times daily.   BUSPIRONE (BUSPAR) 5 MG TABLET    Take 5 mg by mouth daily as needed.   DOCUSATE SODIUM (COLACE) 100 MG CAPSULE    Take 100 mg by mouth 2 (two) times daily.   LISINOPRIL (PRINIVIL,ZESTRIL) 2.5 MG TABLET    TAKE ONE TABLET BY MOUTH ONE TIME DAILY   MILRINONE LACTATE IV    Inject 1.25 mcg into the vein 3 (three) times daily.   MULTIPLE VITAMIN (MULTIVITAMIN) TABLET    Take 1 tablet by mouth  daily.   NUTRITIONAL SUPPLEMENT LIQD    Take by mouth. House 2.0 - Give 120cc by mouth three times a day   OMEGA-3 FATTY ACIDS (FISH OIL) 1000 MG CAPS    Take 1 capsule by mouth daily.   TORSEMIDE (DEMADEX) 20 MG TABLET    TAKE TWO TABLETS BY MOUTH TWICE DAILY   TRAMADOL (ULTRAM) 50 MG TABLET    Take 25 mg by mouth every 8 (eight) hours as needed for moderate pain.  Modified Medications   No medications on file  Discontinued Medications   No medications on file    Review of Systems  Vitals:   04/08/17 1000  BP: (!) 85/57  Pulse: 89  Resp: 18  Temp: 98.7 F (37.1 C)  TempSrc: Oral  SpO2: 96%  Weight: 89 lb 8 oz (40.6 kg)  Height: _0  (1.448 m)   Body mass index is 19.37 kg/m.  Physical Exam   Labs reviewed: Nursing Home on 04/07/2017  Component Date Value Ref Range Status  . Hemoglobin 03/18/2017 11.8* 12.0 - 16.0 Final  . HCT 03/18/2017 36  36 - 46 Final  . Neutrophils Absolute 03/18/2017 16   Final  . Platelets 03/18/2017 571* 150 - 399 Final  . WBC 03/18/2017 18.2   Final  . Glucose 03/18/2017 93   Final  . BUN 03/18/2017 23* 4 - 21 Final  . Creatinine 03/18/2017 1.1  0.5 - 1.1 Final  . Potassium 03/18/2017 3.3* 3.4 - 5.3 Final  . Sodium 03/18/2017 144  137 - 147 Final  Admission on 04/04/2017, Discharged on 04/05/2017  Component Date Value Ref Range Status  . WBC 04/04/2017 6.8  4.0 - 10.5 K/uL Final  . RBC 04/04/2017 3.83* 3.87 - 5.11 MIL/uL Final  . Hemoglobin 04/04/2017 11.1* 12.0 - 15.0 g/dL Final  . HCT 04/04/2017 34.7* 36.0 - 46.0 % Final  . MCV 04/04/2017 90.6  78.0 - 100.0 fL Final  . MCH 04/04/2017 29.0  26.0 - 34.0 pg Final  . MCHC 04/04/2017 32.0  30.0 - 36.0 g/dL Final  . RDW 04/04/2017 15.7* 11.5 - 15.5 % Final  . Platelets 04/04/2017 294  150 - 400 K/uL Final  . Neutrophils Relative % 04/04/2017 51  % Final  . Neutro Abs 04/04/2017 3.5  1.7 - 7.7 K/uL Final  . Lymphocytes Relative 04/04/2017 38  % Final  . Lymphs Abs 04/04/2017 2.5  0.7 -  4.0 K/uL Final  . Monocytes Relative 04/04/2017 8  % Final  . Monocytes Absolute 04/04/2017 0.6  0.1 - 1.0 K/uL Final  .  Eosinophils Relative 04/04/2017 3  % Final  . Eosinophils Absolute 04/04/2017 0.2  0.0 - 0.7 K/uL Final  . Basophils Relative 04/04/2017 0  % Final  . Basophils Absolute 04/04/2017 0.0  0.0 - 0.1 K/uL Final  . Sodium 04/04/2017 139  135 - 145 mmol/L Final  . Potassium 04/04/2017 3.0* 3.5 - 5.1 mmol/L Final  . Chloride 04/04/2017 103  101 - 111 mmol/L Final  . CO2 04/04/2017 25  22 - 32 mmol/L Final  . Glucose, Bld 04/04/2017 88  65 - 99 mg/dL Final  . BUN 04/04/2017 16  6 - 20 mg/dL Final  . Creatinine, Ser 04/04/2017 0.90  0.44 - 1.00 mg/dL Final  . Calcium 04/04/2017 8.6* 8.9 - 10.3 mg/dL Final  . GFR calc non Af Amer 04/04/2017 54* >60 mL/min Final  . GFR calc Af Amer 04/04/2017 >60  >60 mL/min Final   Comment: (NOTE) The eGFR has been calculated using the CKD EPI equation. This calculation has not been validated in all clinical situations. eGFR's persistently <60 mL/min signify possible Chronic Kidney Disease.   . Anion gap 04/04/2017 11  5 - 15 Final  . Troponin I 04/04/2017 <0.03  <0.03 ng/mL Final  . B Natriuretic Peptide 04/04/2017 412.3* 0.0 - 100.0 pg/mL Final  . Sodium 04/05/2017 141  135 - 145 mmol/L Final  . Potassium 04/05/2017 3.3* 3.5 - 5.1 mmol/L Final  . Chloride 04/05/2017 103  101 - 111 mmol/L Final  . CO2 04/05/2017 28  22 - 32 mmol/L Final  . Glucose, Bld 04/05/2017 85  65 - 99 mg/dL Final  . BUN 04/05/2017 13  6 - 20 mg/dL Final  . Creatinine, Ser 04/05/2017 0.91  0.44 - 1.00 mg/dL Final  . Calcium 04/05/2017 8.5* 8.9 - 10.3 mg/dL Final  . GFR calc non Af Amer 04/05/2017 54* >60 mL/min Final  . GFR calc Af Amer 04/05/2017 >60  >60 mL/min Final   Comment: (NOTE) The eGFR has been calculated using the CKD EPI equation. This calculation has not been validated in all clinical situations. eGFR's persistently <60 mL/min signify possible  Chronic Kidney Disease.   . Anion gap 04/05/2017 10  5 - 15 Final  . Total hemoglobin 04/05/2017 10.9* 12.0 - 16.0 g/dL Final  . O2 Saturation 04/05/2017 62.3  % Final  . Carboxyhemoglobin 04/05/2017 1.1  0.5 - 1.5 % Final  . Methemoglobin 04/05/2017 1.2  0.0 - 1.5 % Final  . MRSA by PCR 04/05/2017 NEGATIVE  NEGATIVE Final   Comment:        The GeneXpert MRSA Assay (FDA approved for NASAL specimens only), is one component of a comprehensive MRSA colonization surveillance program. It is not intended to diagnose MRSA infection nor to guide or monitor treatment for MRSA infections.   . Magnesium 04/05/2017 2.1  1.7 - 2.4 mg/dL Final  Abstract on 03/10/2017  Component Date Value Ref Range Status  . Hemoglobin 03/10/2017 11.8* 12.0 - 16.0 g/dL Final  . HCT 03/10/2017 36  36 - 46 % Final  . Neutrophils Absolute 03/10/2017 5  /L Final  . Platelets 03/10/2017 283  150 - 399 K/L Final  . WBC 03/10/2017 7.8  10^3/mL Final  . Glucose 03/10/2017 96  mg/dL Final  . BUN 03/10/2017 23* 4 - 21 mg/dL Final  . Creatinine 03/10/2017 0.8  0.5 - 1.1 mg/dL Final  . Potassium 03/10/2017 4.4  3.4 - 5.3 mmol/L Final  . Sodium 03/10/2017 141  137 - 147 mmol/L Final  .  Alkaline Phosphatase 03/10/2017 63  25 - 125 U/L Final  . ALT 03/10/2017 5* 7 - 35 U/L Final   <  . AST 03/10/2017 20  13 - 35 U/L Final  . Bilirubin, Total 03/10/2017 0.2  mg/dL Final  Admission on 03/04/2017, Discharged on 03/08/2017  Component Date Value Ref Range Status  . Troponin i, poc 03/04/2017 0.09* 0.00 - 0.08 ng/mL Final  . Comment 03/04/2017 NOTIFIED PHYSICIAN   Final  . Comment 3 03/04/2017          Final   Comment: Due to the release kinetics of cTnI, a negative result within the first hours of the onset of symptoms does not rule out myocardial infarction with certainty. If myocardial infarction is still suspected, repeat the test at appropriate intervals.   . B Natriuretic Peptide 03/04/2017 69.6  0.0 - 100.0  pg/mL Final  . WBC 03/04/2017 18.3* 4.0 - 10.5 K/uL Final  . RBC 03/04/2017 4.48  3.87 - 5.11 MIL/uL Final  . Hemoglobin 03/04/2017 13.2  12.0 - 15.0 g/dL Final  . HCT 03/04/2017 39.8  36.0 - 46.0 % Final  . MCV 03/04/2017 88.8  78.0 - 100.0 fL Final  . MCH 03/04/2017 29.5  26.0 - 34.0 pg Final  . MCHC 03/04/2017 33.2  30.0 - 36.0 g/dL Final  . RDW 03/04/2017 14.5  11.5 - 15.5 % Final  . Platelets 03/04/2017 272  150 - 400 K/uL Final  . Neutrophils Relative % 03/04/2017 90  % Final  . Lymphocytes Relative 03/04/2017 8  % Final  . Monocytes Relative 03/04/2017 2  % Final  . Eosinophils Relative 03/04/2017 0  % Final  . Basophils Relative 03/04/2017 0  % Final  . Neutro Abs 03/04/2017 16.4* 1.7 - 7.7 K/uL Final  . Lymphs Abs 03/04/2017 1.5  0.7 - 4.0 K/uL Final  . Monocytes Absolute 03/04/2017 0.4  0.1 - 1.0 K/uL Final  . Eosinophils Absolute 03/04/2017 0.0  0.0 - 0.7 K/uL Final  . Basophils Absolute 03/04/2017 0.0  0.0 - 0.1 K/uL Final  . WBC Morphology 03/04/2017 VACUOLATED NEUTROPHILS   Final  . Sodium 03/04/2017 140  135 - 145 mmol/L Final  . Potassium 03/04/2017 3.6  3.5 - 5.1 mmol/L Final  . Chloride 03/04/2017 100* 101 - 111 mmol/L Final  . CO2 03/04/2017 27  22 - 32 mmol/L Final  . Glucose, Bld 03/04/2017 132* 65 - 99 mg/dL Final  . BUN 03/04/2017 20  6 - 20 mg/dL Final  . Creatinine, Ser 03/04/2017 0.99  0.44 - 1.00 mg/dL Final  . Calcium 03/04/2017 9.4  8.9 - 10.3 mg/dL Final  . Total Protein 03/04/2017 7.4  6.5 - 8.1 g/dL Final  . Albumin 03/04/2017 4.2  3.5 - 5.0 g/dL Final  . AST 03/04/2017 26  15 - 41 U/L Final  . ALT 03/04/2017 18  14 - 54 U/L Final  . Alkaline Phosphatase 03/04/2017 89  38 - 126 U/L Final  . Total Bilirubin 03/04/2017 0.7  0.3 - 1.2 mg/dL Final  . GFR calc non Af Amer 03/04/2017 48* >60 mL/min Final  . GFR calc Af Amer 03/04/2017 56* >60 mL/min Final   Comment: (NOTE) The eGFR has been calculated using the CKD EPI equation. This calculation has  not been validated in all clinical situations. eGFR's persistently <60 mL/min signify possible Chronic Kidney Disease.   . Anion gap 03/04/2017 13  5 - 15 Final  . Prothrombin Time 03/04/2017 14.2  11.4 - 15.2 seconds  Final  . INR 03/04/2017 1.10   Final  . MRSA, PCR 03/04/2017 NEGATIVE  NEGATIVE Final  . Staphylococcus aureus 03/04/2017 POSITIVE* NEGATIVE Final   Comment:        The Xpert SA Assay (FDA approved for NASAL specimens in patients over 39 years of age), is one component of a comprehensive surveillance program.  Test performance has been validated by [facility] for patients greater than or equal to 61 year old. It is not intended to diagnose infection nor to guide or monitor treatment.   . Troponin I 03/04/2017 0.23* <0.03 ng/mL Final   Comment: CRITICAL RESULT CALLED TO, READ BACK BY AND VERIFIED WITH: Bernadette Hoit RN [phone] 2018 GREEN R   . Troponin I 03/05/2017 0.17* <0.03 ng/mL Final   CRITICAL VALUE NOTED.  VALUE IS CONSISTENT WITH PREVIOUSLY REPORTED AND CALLED VALUE.  . Troponin I 03/05/2017 0.12* <0.03 ng/mL Final   CRITICAL VALUE NOTED.  VALUE IS CONSISTENT WITH PREVIOUSLY REPORTED AND CALLED VALUE.  . TSH 03/04/2017 0.394  0.350 - 4.500 uIU/mL Final   Performed by a 3rd Generation assay with a functional sensitivity of <=0.01 uIU/mL.  . WBC 03/04/2017 10.8* 4.0 - 10.5 K/uL Final  . RBC 03/04/2017 4.33  3.87 - 5.11 MIL/uL Final  . Hemoglobin 03/04/2017 12.4  12.0 - 15.0 g/dL Final  . HCT 03/04/2017 38.3  36.0 - 46.0 % Final  . MCV 03/04/2017 88.5  78.0 - 100.0 fL Final  . MCH 03/04/2017 28.6  26.0 - 34.0 pg Final  . MCHC 03/04/2017 32.4  30.0 - 36.0 g/dL Final  . RDW 03/04/2017 14.4  11.5 - 15.5 % Final  . Platelets 03/04/2017 244  150 - 400 K/uL Final  . Creatinine, Ser 03/04/2017 1.00  0.44 - 1.00 mg/dL Final  . GFR calc non Af Amer 03/04/2017 48* >60 mL/min Final  . GFR calc Af Amer 03/04/2017 55* >60 mL/min Final   Comment: (NOTE) The eGFR has  been calculated using the CKD EPI equation. This calculation has not been validated in all clinical situations. eGFR's persistently <60 mL/min signify possible Chronic Kidney Disease.   . WBC 03/05/2017 9.4  4.0 - 10.5 K/uL Final  . RBC 03/05/2017 4.14  3.87 - 5.11 MIL/uL Final  . Hemoglobin 03/05/2017 11.6* 12.0 - 15.0 g/dL Final  . HCT 03/05/2017 36.8  36.0 - 46.0 % Final  . MCV 03/05/2017 88.9  78.0 - 100.0 fL Final  . MCH 03/05/2017 28.0  26.0 - 34.0 pg Final  . MCHC 03/05/2017 31.5  30.0 - 36.0 g/dL Final  . RDW 03/05/2017 14.5  11.5 - 15.5 % Final  . Platelets 03/05/2017 221  150 - 400 K/uL Final  . Sodium 03/05/2017 141  135 - 145 mmol/L Final  . Potassium 03/05/2017 3.6  3.5 - 5.1 mmol/L Final  . Chloride 03/05/2017 102  101 - 111 mmol/L Final  . CO2 03/05/2017 29  22 - 32 mmol/L Final  . Glucose, Bld 03/05/2017 90  65 - 99 mg/dL Final  . BUN 03/05/2017 14  6 - 20 mg/dL Final  . Creatinine, Ser 03/05/2017 0.85  0.44 - 1.00 mg/dL Final  . Calcium 03/05/2017 8.5* 8.9 - 10.3 mg/dL Final  . GFR calc non Af Amer 03/05/2017 58* >60 mL/min Final  . GFR calc Af Amer 03/05/2017 >60  >60 mL/min Final   Comment: (NOTE) The eGFR has been calculated using the CKD EPI equation. This calculation has not been validated in all clinical situations.  eGFR's persistently <60 mL/min signify possible Chronic Kidney Disease.   . Anion gap 03/05/2017 10  5 - 15 Final  . ABO/RH(D) 03/05/2017 O NEG   Final  . Antibody Screen 03/05/2017 NEG   Final  . Sample Expiration 03/05/2017 03/08/2017   Final  . WBC 03/06/2017 8.6  4.0 - 10.5 K/uL Final  . RBC 03/06/2017 3.57* 3.87 - 5.11 MIL/uL Final  . Hemoglobin 03/06/2017 10.2* 12.0 - 15.0 g/dL Final  . HCT 03/06/2017 31.8* 36.0 - 46.0 % Final  . MCV 03/06/2017 89.1  78.0 - 100.0 fL Final  . MCH 03/06/2017 28.6  26.0 - 34.0 pg Final  . MCHC 03/06/2017 32.1  30.0 - 36.0 g/dL Final  . RDW 03/06/2017 14.5  11.5 - 15.5 % Final  . Platelets 03/06/2017 200   150 - 400 K/uL Final  . Sodium 03/06/2017 138  135 - 145 mmol/L Final  . Potassium 03/06/2017 3.1* 3.5 - 5.1 mmol/L Final  . Chloride 03/06/2017 98* 101 - 111 mmol/L Final  . CO2 03/06/2017 26  22 - 32 mmol/L Final  . Glucose, Bld 03/06/2017 162* 65 - 99 mg/dL Final  . BUN 03/06/2017 18  6 - 20 mg/dL Final  . Creatinine, Ser 03/06/2017 1.13* 0.44 - 1.00 mg/dL Final  . Calcium 03/06/2017 8.3* 8.9 - 10.3 mg/dL Final  . GFR calc non Af Amer 03/06/2017 41* >60 mL/min Final  . GFR calc Af Amer 03/06/2017 48* >60 mL/min Final   Comment: (NOTE) The eGFR has been calculated using the CKD EPI equation. This calculation has not been validated in all clinical situations. eGFR's persistently <60 mL/min signify possible Chronic Kidney Disease.   . Anion gap 03/06/2017 14  5 - 15 Final  . WBC 03/07/2017 7.6  4.0 - 10.5 K/uL Final  . RBC 03/07/2017 3.32* 3.87 - 5.11 MIL/uL Final  . Hemoglobin 03/07/2017 9.5* 12.0 - 15.0 g/dL Final  . HCT 03/07/2017 29.4* 36.0 - 46.0 % Final  . MCV 03/07/2017 88.6  78.0 - 100.0 fL Final  . MCH 03/07/2017 28.6  26.0 - 34.0 pg Final  . MCHC 03/07/2017 32.3  30.0 - 36.0 g/dL Final  . RDW 03/07/2017 14.4  11.5 - 15.5 % Final  . Platelets 03/07/2017 189  150 - 400 K/uL Final  . Sodium 03/07/2017 140  135 - 145 mmol/L Final  . Potassium 03/07/2017 2.9* 3.5 - 5.1 mmol/L Final  . Chloride 03/07/2017 99* 101 - 111 mmol/L Final  . CO2 03/07/2017 30  22 - 32 mmol/L Final  . Glucose, Bld 03/07/2017 95  65 - 99 mg/dL Final  . BUN 03/07/2017 16  6 - 20 mg/dL Final  . Creatinine, Ser 03/07/2017 0.90  0.44 - 1.00 mg/dL Final  . Calcium 03/07/2017 8.4* 8.9 - 10.3 mg/dL Final  . GFR calc non Af Amer 03/07/2017 54* >60 mL/min Final  . GFR calc Af Amer 03/07/2017 >60  >60 mL/min Final   Comment: (NOTE) The eGFR has been calculated using the CKD EPI equation. This calculation has not been validated in all clinical situations. eGFR's persistently <60 mL/min signify possible  Chronic Kidney Disease.   . Anion gap 03/07/2017 11  5 - 15 Final  . Magnesium 03/07/2017 2.0  1.7 - 2.4 mg/dL Final  . Phosphorus 03/07/2017 2.8  2.5 - 4.6 mg/dL Final  . WBC 03/08/2017 7.9  4.0 - 10.5 K/uL Final  . RBC 03/08/2017 3.30* 3.87 - 5.11 MIL/uL Final  . Hemoglobin 03/08/2017 9.4* 12.0 -  15.0 g/dL Final  . HCT 03/08/2017 29.5* 36.0 - 46.0 % Final  . MCV 03/08/2017 89.4  78.0 - 100.0 fL Final  . MCH 03/08/2017 28.5  26.0 - 34.0 pg Final  . MCHC 03/08/2017 31.9  30.0 - 36.0 g/dL Final  . RDW 03/08/2017 14.6  11.5 - 15.5 % Final  . Platelets 03/08/2017 220  150 - 400 K/uL Final  . Sodium 03/08/2017 138  135 - 145 mmol/L Final  . Potassium 03/08/2017 4.4  3.5 - 5.1 mmol/L Final   DELTA CHECK NOTED  . Chloride 03/08/2017 101  101 - 111 mmol/L Final  . CO2 03/08/2017 30  22 - 32 mmol/L Final  . Glucose, Bld 03/08/2017 98  65 - 99 mg/dL Final  . BUN 03/08/2017 14  6 - 20 mg/dL Final  . Creatinine, Ser 03/08/2017 0.84  0.44 - 1.00 mg/dL Final  . Calcium 03/08/2017 8.4* 8.9 - 10.3 mg/dL Final  . GFR calc non Af Amer 03/08/2017 59* >60 mL/min Final  . GFR calc Af Amer 03/08/2017 >60  >60 mL/min Final   Comment: (NOTE) The eGFR has been calculated using the CKD EPI equation. This calculation has not been validated in all clinical situations. eGFR's persistently <60 mL/min signify possible Chronic Kidney Disease.   . Anion gap 03/08/2017 7  5 - 15 Final    Dg Chest 2 View  Result Date: 04/05/2017 CLINICAL DATA:  Shortness of breath EXAM: CHEST  2 VIEW COMPARISON:  03/04/2017 FINDINGS: Cardiomegaly. Hyperinflation. No focal consolidation or effusion. Left upper extremity catheter tip overlies the SVC. Tortuous aorta with calcification. No pneumothorax. Sclerotic lesion in the proximal left humerus unchanged. Coarse calcification left breast also unchanged. IMPRESSION: Cardiomegaly.  No edema or infiltrate. Electronically Signed   By: Donavan Foil M.D.   On: 04/05/2017 00:19   Xr  Hip Unilat W Or W/o Pelvis 2-3 Views Left  Result Date: 03/18/2017 Stable hip replacement    Assessment/Plan    Jacqueline Reilly  [facility] [place], [place] [address] [phone] Cell (Monday-Friday 8 AM - 5 PM) [phone] After 5 PM and follow prompts  This encounter was created in error - please disregard.

## 2017-04-11 ENCOUNTER — Telehealth (HOSPITAL_COMMUNITY): Payer: Self-pay | Admitting: *Deleted

## 2017-04-11 NOTE — Telephone Encounter (Signed)
Adam w/[facility] came by to get forms completed and signed by Dr Haroldine Laws for pt's home milrinone.  Called Adam back at [phone], he states pt is being d/c'd from [facility] on Monday and they have been chosen to supply pt's milrinone and [facility] will provide pt's nursing and line care.  Forms completed and signed and faxed along with last OV and hosp d/c form July 2015 (when milrinone was started) faxed to them at [phone]

## 2017-04-14 ENCOUNTER — Telehealth (HOSPITAL_COMMUNITY): Payer: Self-pay

## 2017-04-14 NOTE — Telephone Encounter (Signed)
Spoke with Sandi Mariscal with [facility] regarding patient's DC from SNF today and transfer of home inotrope care to their services. Order forms signed by Dr. Haroldine Laws and faxed to provided confirmed # [phone] along with requesting/supporting documentation. Adam states this is all that is left to be done to start patient care with home inotropes. Also made patient HFU apt with Dr. Haroldine Laws tomorrow after confirming her DC with [facility].  Scheduler will make family and patient aware of this apt.  Renee Pain, RN

## 2017-04-15 ENCOUNTER — Inpatient Hospital Stay (HOSPITAL_COMMUNITY): Admission: RE | Admit: 2017-04-15 | Payer: Medicare Other | Source: Ambulatory Visit | Admitting: Internal Medicine

## 2017-04-15 NOTE — Telephone Encounter (Signed)
Jacqueline Reilly called back today and left message on triage stating that medicare will not cover her Milrinone since she is documented as being NYHA II.    I spoke with Dr. Haroldine Laws and she is on milrinone as part of her palliative care.  I called him back and was able to talk with [name] and she is requesting of documentation stating that for medicare approval.  I have notified Dr. Haroldine Laws of this request.

## 2017-04-16 ENCOUNTER — Encounter (HOSPITAL_COMMUNITY): Payer: Medicare Other

## 2017-04-16 DIAGNOSIS — I10 Essential (primary) hypertension: Secondary | ICD-10-CM | POA: Diagnosis not present

## 2017-04-16 DIAGNOSIS — I4891 Unspecified atrial fibrillation: Secondary | ICD-10-CM | POA: Diagnosis not present

## 2017-04-16 DIAGNOSIS — E785 Hyperlipidemia, unspecified: Secondary | ICD-10-CM | POA: Diagnosis not present

## 2017-04-16 NOTE — Addendum Note (Signed)
Encounter addended by: Jolaine Artist, MD on: 04/16/2017  5:28 PM<BR>    Actions taken: Sign clinical note

## 2017-04-17 ENCOUNTER — Encounter (HOSPITAL_COMMUNITY): Payer: Self-pay | Admitting: Internal Medicine

## 2017-04-17 ENCOUNTER — Ambulatory Visit (HOSPITAL_COMMUNITY)
Admission: RE | Admit: 2017-04-17 | Discharge: 2017-04-17 | Disposition: A | Discharge status: Home / Self Care | Payer: Medicare Other | Source: Ambulatory Visit | Attending: Internal Medicine | Admitting: Internal Medicine

## 2017-04-17 VITALS — BP 118/68 | HR 84 | Wt 89.8 lb

## 2017-04-17 DIAGNOSIS — I739 Peripheral vascular disease, unspecified: Secondary | ICD-10-CM | POA: Insufficient documentation

## 2017-04-17 DIAGNOSIS — E876 Hypokalemia: Secondary | ICD-10-CM | POA: Insufficient documentation

## 2017-04-17 DIAGNOSIS — I480 Paroxysmal atrial fibrillation: Secondary | ICD-10-CM

## 2017-04-17 DIAGNOSIS — I4891 Unspecified atrial fibrillation: Secondary | ICD-10-CM | POA: Insufficient documentation

## 2017-04-17 DIAGNOSIS — I2583 Coronary atherosclerosis due to lipid rich plaque: Secondary | ICD-10-CM | POA: Diagnosis not present

## 2017-04-17 DIAGNOSIS — I110 Hypertensive heart disease with heart failure: Secondary | ICD-10-CM | POA: Insufficient documentation

## 2017-04-17 DIAGNOSIS — I5022 Chronic systolic (congestive) heart failure: Secondary | ICD-10-CM | POA: Diagnosis not present

## 2017-04-17 DIAGNOSIS — I429 Cardiomyopathy, unspecified: Secondary | ICD-10-CM | POA: Insufficient documentation

## 2017-04-17 DIAGNOSIS — F0390 Unspecified dementia without behavioral disturbance: Secondary | ICD-10-CM | POA: Diagnosis not present

## 2017-04-17 DIAGNOSIS — E785 Hyperlipidemia, unspecified: Secondary | ICD-10-CM | POA: Insufficient documentation

## 2017-04-17 DIAGNOSIS — I2510 Atherosclerotic heart disease of native coronary artery without angina pectoris: Secondary | ICD-10-CM | POA: Diagnosis not present

## 2017-04-17 DIAGNOSIS — Z7901 Long term (current) use of anticoagulants: Secondary | ICD-10-CM | POA: Diagnosis not present

## 2017-04-17 LAB — BASIC METABOLIC PANEL
Anion gap: 8 (ref 5–15)
BUN: 15 mg/dL (ref 6–20)
CO2: 29 mmol/L (ref 22–32)
Calcium: 8.9 mg/dL (ref 8.9–10.3)
Chloride: 99 mmol/L — ABNORMAL LOW (ref 101–111)
Creatinine, Ser: 0.84 mg/dL (ref 0.44–1.00)
GFR calc Af Amer: >60 mL/min (ref >60)
GFR calc non Af Amer: 59 mL/min — ABNORMAL LOW (ref >60)
Glucose, Bld: 93 mg/dL (ref 65–99)
Potassium: 3.9 mmol/L (ref 3.5–5.1)
Sodium: 136 mmol/L (ref 135–145)

## 2017-04-17 NOTE — Progress Notes (Signed)
Patient ID: Jacqueline Reilly, female   DOB: [birth year], 83 y.o.   MRN: [id number]   ADVANCED HF CLINIC NOTE  Patient ID: Jacqueline Reilly, female   DOB: Mar 19, 1924, 83 y.o.   MRN: [id number] PCP: [name] CHF: Bensimhon  HPI: Jacqueline Reilly) is an 83 y/o woman with CAD, AAA, systolic HF, mild dementia, afib and CAD. She has had two previous coronary interventions including a cutting balloon to her D2 in 2007 and a DES to her mid LCX in 2010. Her LV dysfunction has been out of proportion to her CAD.  Last echo in 2/18 showed EF 20-25% with severe LV dilation.  Admitted 7/11-7/24/15 with syncope and dyspnea. She had new onset A-fib/RVR. Troponin was 1.03> 1.59> 2.18 . She was placed on amiodarone and heparin drip. She developed respiratory distress and required intubation. She converted to NSR but later was bradycardiac with NSVT. Amiodarone temporarily stopped but restarted after hyperkalemia resolved. Hyperkalemia was thought to be contributing to bradycardia. Diuresed with IV lasix. Co-ox 29% and started on milrinone which we tried to wean off but she did not tolerate. Discharge weight 87 lbs.   Admitted in November 2016 for Gram-negative bacteremia ([place]) - enterobacter asburiae from PICC. Treated with ceftriaxone and then switched to oral levofloxacin for a total of 21 weeks and PICC replaced.   She has remained on milrinone for nearly 3 years now.   Had hip fracture and underwent repair in 5/18. Remains at [facility]. Scheduled to go home tomorrow. There has been a lot of discussion surrounding her ability to get milrinone at home. [facility] has decided against continuing to see her because of her husband's threatening behavior. He was actually committed recently but got out 24 hours later. [facility] has now accepted her for Home Health and will provide milrinone.  Doing well today. Walking much better. No SIOB, orthopnea or PND. Weight stable   Echo 8/16  EF 25% with diffuse hypokinesis, mild  AS/mild AI, RV mildly dilated with moderately decreased systolic function.  Echo 2/18EF 25-30%   ROS: All systems negative except as listed in HPI, PMH and Problem List.  SH:  Social History   Social History  . Marital status: Married    Spouse name: N/A  . Number of children: 1  . Years of education: N/A   Occupational History  . retired    Social History Main Topics  . Smoking status: Never Smoker  . Smokeless tobacco: Never Used  . Alcohol use Yes     Comment: occasionally  . Drug use: No  . Sexual activity: Not Currently   Other Topics Concern  . Not on file   Social History Narrative   She lives in [place], family helps with her care.    FH:  Family History  Problem Relation Age of Onset  . Heart disease Mother   . Heart disease Father   . Heart disease Brother        x 3    Past Medical History:  Diagnosis Date  . AAA (abdominal aortic aneurysm) ([place])   . Atrial fibrillation ([place])   . Chronic systolic heart failure ([place])    a. ECHO (04/2014) EF 20-25%, diff HK, mild MR  . Coronary artery disease 2007   moderate ASCAD of the left system s/p PCI of the D2 and PCI of the left circ 03/2009  . Hyperlipidemia   . Hypertension   . PVD (peripheral vascular disease) ([place])   . Renal  artery stenosis ([place])   . Ventricular dysfunction    left; ischemic    Current Outpatient Prescriptions  Medication Sig Dispense Refill  . acetaminophen (TYLENOL) 500 MG tablet Take 1,000 mg by mouth 3 (three) times daily.    Marland Kitchen amiodarone (PACERONE) 100 MG tablet Take 100 mg by mouth daily.    Marland Kitchen apixaban (ELIQUIS) 2.5 MG TABS tablet Take 1 tablet (2.5 mg total) by mouth 2 (two) times daily. 180 tablet 3  . busPIRone (BUSPAR) 5 MG tablet Take 5 mg by mouth daily as needed.    . docusate sodium (COLACE) 100 MG capsule Take 100 mg by mouth 2 (two) times daily.    Marland Kitchen lisinopril (PRINIVIL,ZESTRIL) 2.5 MG tablet TAKE ONE TABLET BY MOUTH ONE TIME DAILY 90 tablet 3  . MILRINONE  LACTATE IV Inject 1.25 mcg into the vein 3 (three) times daily.    . Multiple Vitamin (MULTIVITAMIN) tablet Take 1 tablet by mouth daily. 30 tablet 1  . NUTRITIONAL SUPPLEMENT LIQD Take by mouth. House 2.0 - Give 120cc by mouth three times a day    . Omega-3 Fatty Acids (FISH OIL) 1000 MG CAPS Take 1 capsule by mouth daily.    Marland Kitchen torsemide (DEMADEX) 20 MG tablet TAKE TWO TABLETS BY MOUTH TWICE DAILY 120 tablet 3  . traMADol (ULTRAM) 50 MG tablet Take 25 mg by mouth every 8 (eight) hours as needed for moderate pain.     No current facility-administered medications for this encounter.     Vitals:   04/17/17 1019  BP: 118/68  Pulse: 84  SpO2: 99%  Weight: 89 lb 12 oz (40.7 kg)    Wt Readings from Last 3 Encounters:  04/17/17 89 lb 12 oz (40.7 kg)  04/08/17 89 lb 8 oz (40.6 kg)  04/07/17 89 lb 12.8 oz (40.7 kg)   PHYSICAL EXAM: General:  Elderly appearing. Frail. Unkempt. No resp difficulty. Husband at bedside HEENT: normal  anicteric Neck: supple. JVP 9  Carotids 2+ bilaterally no bruit. No LAD/thyromegaly  Cor: PMI laterally displaced .Regular 2/6 AS Lungs: clear Abdomen: soft NT/ND good BS Extremities: no cyanosis, clubbing, rash, edema PICC Site clean  Neuro: alert & oriented. Poor memory  ASSESSMENT & PLAN:   1) Chronic Systolic Heart Failure: Mixed ischemic/nonischemic cardiomyopathy (degree of LV dysfunction is out of proportion to CAD), EF 25% with moderate RV systolic dysfunction .  Echo  12/25/16 EF 25-30% --Stable NYHA II symptoms and volume status stable on milrinone. Will continue milrinone for palliative care purposes (has failed wean in past) --CVP slightly elevated will give extra dose torsemide today. Recently hypokalemic. Will check BMET 2) Atrial fibrillation:  -Paroxysmal.  Remains in regular rhythm today. Continue apixaban 2.5 mg bid (weight less than 60 kg and age >40). Continue amiodarone 100 mg daily.   3) Limited Code: No CPR or defibrillation 4) CAD: No  s/s of ischemia. Off ASA with Eliquis.  5) Dementia; 6) Recent hip fracture --stable. Pending d/c from [place]     Glori Bickers MD  04/17/2017

## 2017-04-17 NOTE — Patient Instructions (Addendum)
Following orders sent back to [employer] [facility]:   Continue Milrinone  Give an extra 20 mg of [place] today (bmet)  Your physician recommends that you schedule a follow-up appointment in: 3 month

## 2017-04-22 DIAGNOSIS — Z7901 Long term (current) use of anticoagulants: Secondary | ICD-10-CM | POA: Diagnosis not present

## 2017-04-22 DIAGNOSIS — I420 Dilated cardiomyopathy: Secondary | ICD-10-CM | POA: Diagnosis not present

## 2017-04-22 DIAGNOSIS — I739 Peripheral vascular disease, unspecified: Secondary | ICD-10-CM | POA: Diagnosis not present

## 2017-04-22 DIAGNOSIS — S32302D Unspecified fracture of left ilium, subsequent encounter for fracture with routine healing: Secondary | ICD-10-CM | POA: Diagnosis not present

## 2017-04-22 DIAGNOSIS — F0390 Unspecified dementia without behavioral disturbance: Secondary | ICD-10-CM | POA: Diagnosis not present

## 2017-04-22 DIAGNOSIS — I2510 Atherosclerotic heart disease of native coronary artery without angina pectoris: Secondary | ICD-10-CM | POA: Diagnosis not present

## 2017-04-22 DIAGNOSIS — E440 Moderate protein-calorie malnutrition: Secondary | ICD-10-CM | POA: Diagnosis not present

## 2017-04-22 DIAGNOSIS — I481 Persistent atrial fibrillation: Secondary | ICD-10-CM | POA: Diagnosis not present

## 2017-04-22 DIAGNOSIS — Z96642 Presence of left artificial hip joint: Secondary | ICD-10-CM | POA: Diagnosis not present

## 2017-04-22 DIAGNOSIS — Z9981 Dependence on supplemental oxygen: Secondary | ICD-10-CM | POA: Diagnosis not present

## 2017-04-22 DIAGNOSIS — I110 Hypertensive heart disease with heart failure: Secondary | ICD-10-CM | POA: Diagnosis not present

## 2017-04-22 DIAGNOSIS — J9690 Respiratory failure, unspecified, unspecified whether with hypoxia or hypercapnia: Secondary | ICD-10-CM | POA: Diagnosis not present

## 2017-04-22 DIAGNOSIS — D649 Anemia, unspecified: Secondary | ICD-10-CM | POA: Diagnosis not present

## 2017-04-22 DIAGNOSIS — E782 Mixed hyperlipidemia: Secondary | ICD-10-CM | POA: Diagnosis not present

## 2017-04-22 DIAGNOSIS — I5042 Chronic combined systolic (congestive) and diastolic (congestive) heart failure: Secondary | ICD-10-CM | POA: Diagnosis not present

## 2017-04-22 DIAGNOSIS — Z452 Encounter for adjustment and management of vascular access device: Secondary | ICD-10-CM | POA: Diagnosis not present

## 2017-04-22 DIAGNOSIS — I252 Old myocardial infarction: Secondary | ICD-10-CM | POA: Diagnosis not present

## 2017-04-25 DIAGNOSIS — I110 Hypertensive heart disease with heart failure: Secondary | ICD-10-CM | POA: Diagnosis not present

## 2017-04-25 DIAGNOSIS — I481 Persistent atrial fibrillation: Secondary | ICD-10-CM | POA: Diagnosis not present

## 2017-04-25 DIAGNOSIS — S32302D Unspecified fracture of left ilium, subsequent encounter for fracture with routine healing: Secondary | ICD-10-CM | POA: Diagnosis not present

## 2017-04-25 DIAGNOSIS — I5042 Chronic combined systolic (congestive) and diastolic (congestive) heart failure: Secondary | ICD-10-CM | POA: Diagnosis not present

## 2017-04-25 DIAGNOSIS — I739 Peripheral vascular disease, unspecified: Secondary | ICD-10-CM | POA: Diagnosis not present

## 2017-04-25 DIAGNOSIS — J9690 Respiratory failure, unspecified, unspecified whether with hypoxia or hypercapnia: Secondary | ICD-10-CM | POA: Diagnosis not present

## 2017-04-27 DIAGNOSIS — I481 Persistent atrial fibrillation: Secondary | ICD-10-CM | POA: Diagnosis not present

## 2017-04-27 DIAGNOSIS — S32302D Unspecified fracture of left ilium, subsequent encounter for fracture with routine healing: Secondary | ICD-10-CM | POA: Diagnosis not present

## 2017-04-27 DIAGNOSIS — J9690 Respiratory failure, unspecified, unspecified whether with hypoxia or hypercapnia: Secondary | ICD-10-CM | POA: Diagnosis not present

## 2017-04-27 DIAGNOSIS — I5042 Chronic combined systolic (congestive) and diastolic (congestive) heart failure: Secondary | ICD-10-CM | POA: Diagnosis not present

## 2017-04-27 DIAGNOSIS — I739 Peripheral vascular disease, unspecified: Secondary | ICD-10-CM | POA: Diagnosis not present

## 2017-04-27 DIAGNOSIS — I110 Hypertensive heart disease with heart failure: Secondary | ICD-10-CM | POA: Diagnosis not present

## 2017-04-28 DIAGNOSIS — I5042 Chronic combined systolic (congestive) and diastolic (congestive) heart failure: Secondary | ICD-10-CM | POA: Diagnosis not present

## 2017-04-28 DIAGNOSIS — I110 Hypertensive heart disease with heart failure: Secondary | ICD-10-CM | POA: Diagnosis not present

## 2017-04-28 DIAGNOSIS — I739 Peripheral vascular disease, unspecified: Secondary | ICD-10-CM | POA: Diagnosis not present

## 2017-04-28 DIAGNOSIS — S32302D Unspecified fracture of left ilium, subsequent encounter for fracture with routine healing: Secondary | ICD-10-CM | POA: Diagnosis not present

## 2017-04-28 DIAGNOSIS — I481 Persistent atrial fibrillation: Secondary | ICD-10-CM | POA: Diagnosis not present

## 2017-04-28 DIAGNOSIS — J9690 Respiratory failure, unspecified, unspecified whether with hypoxia or hypercapnia: Secondary | ICD-10-CM | POA: Diagnosis not present

## 2017-04-29 ENCOUNTER — Encounter (HOSPITAL_COMMUNITY): Payer: Self-pay

## 2017-04-29 ENCOUNTER — Emergency Department (HOSPITAL_COMMUNITY): Payer: Medicare Other

## 2017-04-29 ENCOUNTER — Emergency Department (HOSPITAL_COMMUNITY)
Admission: EM | Admit: 2017-04-29 | Discharge: 2017-04-29 | Disposition: A | Discharge status: Home / Self Care | Payer: Medicare Other | Attending: Emergency Medicine | Admitting: Emergency Medicine

## 2017-04-29 ENCOUNTER — Ambulatory Visit (INDEPENDENT_AMBULATORY_CARE_PROVIDER_SITE_OTHER): Payer: Medicare Other | Admitting: Orthopaedic Surgery

## 2017-04-29 DIAGNOSIS — R112 Nausea with vomiting, unspecified: Secondary | ICD-10-CM | POA: Diagnosis not present

## 2017-04-29 DIAGNOSIS — E785 Hyperlipidemia, unspecified: Secondary | ICD-10-CM | POA: Diagnosis not present

## 2017-04-29 DIAGNOSIS — I2510 Atherosclerotic heart disease of native coronary artery without angina pectoris: Secondary | ICD-10-CM | POA: Diagnosis not present

## 2017-04-29 DIAGNOSIS — R0602 Shortness of breath: Secondary | ICD-10-CM | POA: Diagnosis not present

## 2017-04-29 DIAGNOSIS — Z8673 Personal history of transient ischemic attack (TIA), and cerebral infarction without residual deficits: Secondary | ICD-10-CM | POA: Diagnosis not present

## 2017-04-29 DIAGNOSIS — I5022 Chronic systolic (congestive) heart failure: Secondary | ICD-10-CM | POA: Insufficient documentation

## 2017-04-29 DIAGNOSIS — I110 Hypertensive heart disease with heart failure: Secondary | ICD-10-CM | POA: Insufficient documentation

## 2017-04-29 DIAGNOSIS — I714 Abdominal aortic aneurysm, without rupture: Secondary | ICD-10-CM | POA: Diagnosis not present

## 2017-04-29 DIAGNOSIS — I4891 Unspecified atrial fibrillation: Secondary | ICD-10-CM | POA: Diagnosis not present

## 2017-04-29 DIAGNOSIS — R079 Chest pain, unspecified: Secondary | ICD-10-CM | POA: Diagnosis not present

## 2017-04-29 LAB — CBC
HCT: 32.9 % — ABNORMAL LOW (ref 36.0–46.0)
Hemoglobin: 10.4 g/dL — ABNORMAL LOW (ref 12.0–15.0)
MCH: 28.7 pg (ref 26.0–34.0)
MCHC: 31.6 g/dL (ref 30.0–36.0)
MCV: 90.9 fL (ref 78.0–100.0)
Platelets: 327 10*3/uL (ref 150–400)
RBC: 3.62 MIL/uL — ABNORMAL LOW (ref 3.87–5.11)
RDW: 15.4 % (ref 11.5–15.5)
WBC: 8.4 10*3/uL (ref 4.0–10.5)

## 2017-04-29 LAB — COMPREHENSIVE METABOLIC PANEL
ALT: 10 U/L — ABNORMAL LOW (ref 14–54)
AST: 18 U/L (ref 15–41)
Albumin: 3 g/dL — ABNORMAL LOW (ref 3.5–5.0)
Alkaline Phosphatase: 90 U/L (ref 38–126)
Anion gap: 10 (ref 5–15)
BUN: 13 mg/dL (ref 6–20)
CO2: 27 mmol/L (ref 22–32)
Calcium: 8.6 mg/dL — ABNORMAL LOW (ref 8.9–10.3)
Chloride: 101 mmol/L (ref 101–111)
Creatinine, Ser: 0.8 mg/dL (ref 0.44–1.00)
GFR calc Af Amer: >60 mL/min (ref >60)
GFR calc non Af Amer: >60 mL/min (ref >60)
Glucose, Bld: 99 mg/dL (ref 65–99)
Potassium: 3 mmol/L — ABNORMAL LOW (ref 3.5–5.1)
Sodium: 138 mmol/L (ref 135–145)
Total Bilirubin: 0.5 mg/dL (ref 0.3–1.2)
Total Protein: 5.8 g/dL — ABNORMAL LOW (ref 6.5–8.1)

## 2017-04-29 LAB — LIPASE, BLOOD: Lipase: 29 U/L (ref 11–51)

## 2017-04-29 LAB — I-STAT TROPONIN, ED: Troponin i, poc: 0.01 ng/mL (ref 0.00–0.08)

## 2017-04-29 MED ORDER — ONDANSETRON 4 MG PO TBDP: 4.0000 mg | ORAL_TABLET | Freq: Three times a day (TID) | ORAL | 0 refills | Status: DC | PRN

## 2017-04-29 MED ORDER — ONDANSETRON 4 MG PO TBDP
4.0000 mg | ORAL_TABLET | Freq: Once | ORAL | Status: AC
  Administered 2017-04-29: 4 mg via ORAL
  Filled 2017-04-29: qty 1

## 2017-04-29 NOTE — Discharge Instructions (Signed)
Please read and follow all provided instructions.  Your diagnoses today include:  1. Nausea and vomiting, intractability of vomiting not specified, unspecified vomiting type   2. Shortness of breath     Tests performed today include: An EKG of your heart A chest x-ray Cardiac enzymes - a blood test for heart muscle damage Blood counts and electrolytes Vital signs. See below for your results today.   Medications prescribed:   Take any prescribed medications only as directed.  Follow-up instructions: Please follow-up with your primary care provider as soon as you can for further evaluation of your symptoms.   Return instructions:  SEEK IMMEDIATE MEDICAL ATTENTION IF: You have severe chest pain, especially if the pain is crushing or pressure-like and spreads to the arms, back, neck, or jaw, or if you have sweating, nausea (feeling sick to your stomach), or shortness of breath. THIS IS AN EMERGENCY. Don't wait to see if the pain will go away. Get medical help at once. Call 911 or 0 (operator). DO NOT drive yourself to the hospital.  Your chest pain gets worse and does not go away with rest.  You have an attack of chest pain lasting longer than usual, despite rest and treatment with the medications your caregiver has prescribed.  You wake from sleep with chest pain or shortness of breath. You feel dizzy or faint. You have chest pain not typical of your usual pain for which you originally saw your caregiver.  You have any other emergent concerns regarding your health.  Additional Information: Chest pain comes from many different causes. Your caregiver has diagnosed you as having chest pain that is not specific for one problem, but does not require admission.  You are at low risk for an acute heart condition or other serious illness.   Your vital signs today were: BP (!) 128/54    Pulse 63    Temp 98 F (36.7 C) (Oral)    Resp 19    Ht 4\' 9"  (1.448 m)    Wt 40.4 kg (89 lb)    SpO2 92%     BMI 19.26 kg/m  If your blood pressure (BP) was elevated above 135/85 this visit, please have this repeated by your doctor within one month. --------------

## 2017-04-29 NOTE — ED Triage Notes (Signed)
Pt brought in by EMS due to  having SOB and chest tightness this morning. Pt received nitrox1, 324mg  of aspirin, and 4mg  of zopfran. Pt has hx of dementia. Per EMS pt had episode of nausea and vomiting. Pt has chronic PICC line milirone. VSS.

## 2017-04-29 NOTE — ED Notes (Signed)
ED Provider at bedside. 

## 2017-04-29 NOTE — ED Notes (Signed)
Case management to bedside to speak with pt and family.

## 2017-04-29 NOTE — Discharge Planning (Signed)
[facility] consulted regarding disposition plan for pt.  EDCM met with pt, husband and son at bedside.  Husband (Lawerence T Marlou Porch) expressed satisfaction with current arrangements for home health for his wife.  Pt left via wheelchair pushed by [place] and husband walking along side.  Pt son [name]) stayed back as to not cause a scene as father very loudly stating his son just wanted his money.  No further CM needs identified.

## 2017-04-29 NOTE — ED Provider Notes (Signed)
[facility] DEPT Provider Note   CSN: [id number] Arrival date & time: 04/29/17  1117     History   Chief Complaint No chief complaint on file.   HPI Jacqueline Reilly is a 83 y.o. female.  HPI  83 y.o. female with a hx of CAD, AAA, systolic HF, mild dementia, afib on Eliquis and CAD. She has had two previous coronary interventions including a cutting balloon to her D2 in 2007 and a DES to her mid LCX in 2010. Her LV dysfunction has been out of proportion to her CAD.  Last echo in 2/18 showed EF 20-25% with severe LV dilation, presents to the Emergency Department today due to persistent nausea this AM. Pt denies chest pain or shortness of breath. No abdominal pain. No diarrhea. No URI symptoms or fevers. Admitted and DCed on 04-04-17 to 04-05-17 due to pump malfunction for milrinone gtt. Milrinone gtt x 3 years now. Pt denies worsening nausea with PO intake. No diaphoresis. Per EMS, pt had 1 bout of emesis and was given Zofran 4mg . HPI limited due to dementia. It was reported that patient had chest pain/shortness of breath this AM at rest. No other symptoms noted.   Level V Caveat: Dementia  Past Medical History:  Diagnosis Date  . AAA (abdominal aortic aneurysm) ([place])   . Atrial fibrillation ([place])   . Chronic systolic heart failure ([place])    a. ECHO (04/2014) EF 20-25%, diff HK, mild MR  . Coronary artery disease 2007   moderate ASCAD of the left system s/p PCI of the D2 and PCI of the left circ 03/2009  . Hyperlipidemia   . Hypertension   . PVD (peripheral vascular disease) ([place])   . Renal artery stenosis ([place])   . Ventricular dysfunction    left; ischemic    Patient Active Problem List   Diagnosis Date Noted  . Heart failure with reduced ejection fraction ([place]) 04/05/2017  . Receiving inotropic medication 04/05/2017  . Hypokalemia 04/05/2017  . Malnutrition of moderate degree 03/05/2017  . Left displaced femoral neck fracture ([place]) 03/04/2017  . Leukocytosis 03/04/2017  .  Hypoxia   . Gram-negative bacteremia 09/01/2015  . Chronic anemia 09/01/2015  . Persistent atrial fibrillation ([place])   . Chronic systolic CHF (congestive heart failure) ([place]) 06/21/2015  . Atrial fibrillation ([place]) 08/10/2014  . Physical deconditioning 05/26/2014  . Acute respiratory failure with hypoxia ([place]) 05/08/2014  . NSTEMI, initial episode of care ([place]) 05/07/2014  . Atrial fibrillation with rapid ventricular response ([place]) 05/07/2014  . NSTEMI (non-ST elevated myocardial infarction) ([place]) 05/07/2014  . Abnormal chest x-ray 04/24/2014  . CHF (congestive heart failure) ([place]) 04/23/2014  . Acute on chronic combined systolic and diastolic congestive heart failure ([place]) 04/23/2014  . AAA (abdominal aortic aneurysm) ([place]) 10/07/2013  . SOB (shortness of breath) 09/30/2013  . Coronary artery disease   . Ischemic dilated cardiomyopathy ([place])   . Hypertension     Past Surgical History:  Procedure Laterality Date  . ANGIOPLASTY    . ANTERIOR APPROACH HEMI HIP ARTHROPLASTY Left 03/05/2017   Procedure: ANTERIOR APPROACH LEFT HIP HEMI ARTHROPLASTY;  Surgeon: Leandrew Koyanagi, MD;  Location: [facility];  Service: Orthopedics;  Laterality: Left;  . CORONARY STENT PLACEMENT    . IR GENERIC HISTORICAL  09/25/2016   IR FLUORO GUIDE CV LINE RIGHT 09/25/2016 Ardis Rowan, PA-C [facility]-INTERV RAD  . retinal cryopexy     right eye (for retinal detachment)    OB History    No  data available       Home Medications    Prior to Admission medications   Medication Sig Start Date End Date Taking? Authorizing Provider  acetaminophen (TYLENOL) 500 MG tablet Take 1,000 mg by mouth 3 (three) times daily.    [provider]  amiodarone (PACERONE) 100 MG tablet Take 100 mg by mouth daily.    [provider]  apixaban (ELIQUIS) 2.5 MG TABS tablet Take 1 tablet (2.5 mg total) by mouth 2 (two) times daily. 11/22/16   Bensimhon, Shaune Pascal, MD  busPIRone (BUSPAR) 5 MG tablet Take 5 mg by mouth  daily as needed.    [provider]  docusate sodium (COLACE) 100 MG capsule Take 100 mg by mouth 2 (two) times daily.    [provider]  lisinopril (PRINIVIL,ZESTRIL) 2.5 MG tablet TAKE ONE TABLET BY MOUTH ONE TIME DAILY 11/21/16   Bensimhon, Shaune Pascal, MD  MILRINONE LACTATE IV Inject 1.25 mcg into the vein 3 (three) times daily.    [provider]  Multiple Vitamin (MULTIVITAMIN) tablet Take 1 tablet by mouth daily. 07/26/14   Larey Dresser, MD  NUTRITIONAL SUPPLEMENT LIQD Take by mouth. House 2.0 - Give 120cc by mouth three times a day    [provider]  Omega-3 Fatty Acids (FISH OIL) 1000 MG CAPS Take 1 capsule by mouth daily.    [provider]  torsemide (DEMADEX) 20 MG tablet TAKE TWO TABLETS BY MOUTH TWICE DAILY 01/31/17   Bensimhon, Shaune Pascal, MD  traMADol (ULTRAM) 50 MG tablet Take 25 mg by mouth every 8 (eight) hours as needed for moderate pain.    [provider]    Family History Family History  Problem Relation Age of Onset  . Heart disease Mother   . Heart disease Father   . Heart disease Brother        x 3    Social History Social History  Substance Use Topics  . Smoking status: Never Smoker  . Smokeless tobacco: Never Used  . Alcohol use Yes     Comment: occasionally     Allergies   Codeine and Garlic   Review of Systems Review of Systems  Unable to perform ROS: Dementia   Physical Exam Updated Vital Signs BP (!) 128/54   Pulse 63   Temp 98 F (36.7 C) (Oral)   Resp 19   Ht 4\' 9"  (1.448 m)   Wt 40.4 kg (89 lb)   SpO2 92%   BMI 19.26 kg/m   Physical Exam  Constitutional: She is oriented to person, place, and time. Vital signs are normal. She appears well-developed and well-nourished. No distress.  HENT:  Head: Normocephalic and atraumatic.  Right Ear: Hearing, tympanic membrane, external ear and ear canal normal.  Left Ear: Hearing, tympanic membrane, external ear and ear canal normal.  Nose:  Nose normal.  Mouth/Throat: Uvula is midline, oropharynx is clear and moist and mucous membranes are normal. No trismus in the jaw. No oropharyngeal exudate, posterior oropharyngeal erythema or tonsillar abscesses.  Eyes: Conjunctivae and EOM are normal. Pupils are equal, round, and reactive to light.  Neck: Normal range of motion. Neck supple. No tracheal deviation present.  Cardiovascular: Normal rate, regular rhythm, S1 normal, S2 normal, normal heart sounds, intact distal pulses and normal pulses.   Pulmonary/Chest: Effort normal and breath sounds normal. No respiratory distress. She has no decreased breath sounds. She has no wheezes. She has no rhonchi. She has no rales.  Abdominal:  Soft. Normal appearance and bowel sounds are normal. There is no tenderness.  Musculoskeletal: Normal range of motion.  Neurological: She is alert and oriented to person, place, and time.  Skin: Skin is warm and dry.  Psychiatric: She has a normal mood and affect. Her speech is normal and behavior is normal. Thought content normal.  Nursing note and vitals reviewed.  ED Treatments / Results  Labs (all labs ordered are listed, but only abnormal results are displayed) Labs Reviewed  CBC - Abnormal; Notable for the following:       Result Value   RBC 3.62 (*)    Hemoglobin 10.4 (*)    HCT 32.9 (*)    All other components within normal limits  COMPREHENSIVE METABOLIC PANEL - Abnormal; Notable for the following:    Potassium 3.0 (*)    Calcium 8.6 (*)    Total Protein 5.8 (*)    Albumin 3.0 (*)    ALT 10 (*)    All other components within normal limits  LIPASE, BLOOD  I-STAT TROPOININ, ED    EKG  EKG Interpretation  Date/Time:  Tuesday April 29 2017 11:31:54 EDT Ventricular Rate:  78 PR Interval:    QRS Duration: 131 QT Interval:  404 QTC Calculation: 430 R Axis:   -51 Text Interpretation:  Sinus rhythm Multiform ventricular premature complexes Prolonged PR interval Consider left atrial  enlargement Left bundle branch block since last tracing no significant change Confirmed by Daleen Bo [phone]) on 04/29/2017 11:38:20 AM       Radiology Dg Chest 2 View  Result Date: 04/29/2017 CLINICAL DATA:  Onset of shortness of breath and chest tightness this morning associated with 1 episode of nausea and vomiting. The patient has a history of coronary artery disease with stent placement, cardiomyopathy, CHF. EXAM: CHEST  2 VIEW COMPARISON:  Chest x-ray of April 04, 2017 FINDINGS: The lungs are adequately inflated. The interstitial markings are mildly prominent though stable. The cardiac silhouette remains enlarged. The pulmonary vascularity is not engorged. There is calcification in the wall of the thoracic aorta. There are degenerative changes of both shoulders. There is degenerative narrowing at multiple thoracic disc levels. IMPRESSION: Chronic cardiomegaly without pulmonary vascular congestion or acute pulmonary edema. No acute pneumonia. Thoracic aortic atherosclerosis. Electronically Signed   By: David  Martinique M.D.   On: 04/29/2017 12:16    Procedures Procedures (including critical care time)  Medications Ordered in ED Medications  ondansetron (ZOFRAN-ODT) disintegrating tablet 4 mg (4 mg Oral Given 04/29/17 1251)     Initial Impression / Assessment and Plan / ED Course  I have reviewed the triage vital signs and the nursing notes.  Pertinent labs & imaging results that were available during my care of the patient were reviewed by me and considered in my medical decision making (see chart for details).  Final Clinical Impressions(s) / ED Diagnoses  {I have reviewed and evaluated the relevant laboratory values. {I have reviewed and evaluated the relevant imaging studies. {I have interpreted the relevant EKG. {I have reviewed the relevant previous healthcare records. {I have reviewed EMS Documentation. {I obtained HPI from historian. {Patient discussed with supervising  physician.  ED Course:  Assessment: Pt is a 83 y.o. female presents hx of CAD, AAA, systolic HF, mild dementia, afib on Eliquis and CAD. She has had two previous coronary interventions including a cutting balloon to her D2 in 2007 and a DES to her mid LCX in 2010. Her LV dysfunction has been out of proportion  to her CAD.  Last echo in 2/18 showed EF 20-25% with severe LV dilation, presents to the Emergency Department today due to persistent nausea this AM. Pt denies chest pain or shortness of breath. No abdominal pain. No diarrhea. No URI symptoms or fevers. Admitted and DCed on 04-04-17 to 04-05-17 due to pump malfunction for milrinone gtt. Milrinone gtt x 3 years now. Pt denies worsening nausea with PO intake. No diaphoresis. Per EMS, pt had 1 bout of emesis and was given Zofran 4mg . HPI limited due to dementia. It was reported that patient had chest pain/shortness of breath this AM at rest. Given zofran in ED. Patient is to be discharged with recommendation to follow up with PCP in regards to today's hospital visit. Chest pain is not likely of cardiac or pulmonary etiology d/t presentation, perc negative, VSS, no tracheal deviation, no JVD or new murmur, RRR, breath sounds equal bilaterally, EKG without acute abnormalities, negative troponin, and negative CXR. Pt also denies chest pain or shortness of breath this morning. This could be likely due to underlying dementia. I suspect nausea and emesis developed s/p ingestion of NTG given by EMS as patient complained of symptoms in the EMS truck. Resolved in ED. Pt seen by supervising physician. Pt has been to return to the ED is CP becomes exertional, associated with diaphoresis or nausea, radiates to left jaw/arm, worsens or becomes concerning in any way. Pt appears reliable for follow up and is agreeable to discharge. Patient is in no acute distress. Vital Signs are stable. Patient is able to ambulate. Patient able to tolerate PO.   Disposition/Plan:  DC  Home Additional Verbal discharge instructions given and discussed with patient.  Pt Instructed to f/u with PCP in the next week for evaluation and treatment of symptoms. Return precautions given Pt acknowledges and agrees with plan  Supervising Physician Daleen Bo, MD  Final diagnoses:  Nausea and vomiting, intractability of vomiting not specified, unspecified vomiting type  Shortness of breath    New Prescriptions New Prescriptions   No medications on file     Shary Decamp, Hershal Coria 04/29/17 1401    Daleen Bo, MD 04/30/17 [id number]    Daleen Bo, MD 04/30/17 [phone]

## 2017-04-29 NOTE — ED Provider Notes (Signed)
  Face-to-face evaluation   History: Presents for evaluation of shortness of breath and chest pain which all started around 11 AM.  She was transferred by EMS and received nitroglycerin.  She apparently vomited during the transfer.  Currently she is complaining only of nausea.  Physical exam: Frail elderly confused patient.  She is responsive.  Heart regular rate and rhythm no murmur lungs clear anteriorly.  Abdomen soft nontender to palpation.  Medical screening examination/treatment/procedure(s) were conducted as a shared visit with non-physician practitioner(s) and myself.  I personally evaluated the patient during the encounter    Daleen Bo, MD 04/30/17 [phone]

## 2017-05-02 ENCOUNTER — Encounter: Payer: Self-pay | Admitting: Internal Medicine

## 2017-05-02 ENCOUNTER — Encounter (HOSPITAL_COMMUNITY): Payer: Self-pay | Admitting: Internal Medicine

## 2017-05-05 DIAGNOSIS — I481 Persistent atrial fibrillation: Secondary | ICD-10-CM | POA: Diagnosis not present

## 2017-05-05 DIAGNOSIS — J9690 Respiratory failure, unspecified, unspecified whether with hypoxia or hypercapnia: Secondary | ICD-10-CM | POA: Diagnosis not present

## 2017-05-05 DIAGNOSIS — I5042 Chronic combined systolic (congestive) and diastolic (congestive) heart failure: Secondary | ICD-10-CM | POA: Diagnosis not present

## 2017-05-05 DIAGNOSIS — S32302D Unspecified fracture of left ilium, subsequent encounter for fracture with routine healing: Secondary | ICD-10-CM | POA: Diagnosis not present

## 2017-05-05 DIAGNOSIS — I739 Peripheral vascular disease, unspecified: Secondary | ICD-10-CM | POA: Diagnosis not present

## 2017-05-05 DIAGNOSIS — I110 Hypertensive heart disease with heart failure: Secondary | ICD-10-CM | POA: Diagnosis not present

## 2017-05-06 DIAGNOSIS — J9690 Respiratory failure, unspecified, unspecified whether with hypoxia or hypercapnia: Secondary | ICD-10-CM | POA: Diagnosis not present

## 2017-05-06 DIAGNOSIS — I739 Peripheral vascular disease, unspecified: Secondary | ICD-10-CM | POA: Diagnosis not present

## 2017-05-06 DIAGNOSIS — I481 Persistent atrial fibrillation: Secondary | ICD-10-CM | POA: Diagnosis not present

## 2017-05-06 DIAGNOSIS — I110 Hypertensive heart disease with heart failure: Secondary | ICD-10-CM | POA: Diagnosis not present

## 2017-05-06 DIAGNOSIS — S32302D Unspecified fracture of left ilium, subsequent encounter for fracture with routine healing: Secondary | ICD-10-CM | POA: Diagnosis not present

## 2017-05-06 DIAGNOSIS — I5042 Chronic combined systolic (congestive) and diastolic (congestive) heart failure: Secondary | ICD-10-CM | POA: Diagnosis not present

## 2017-05-08 DIAGNOSIS — I739 Peripheral vascular disease, unspecified: Secondary | ICD-10-CM | POA: Diagnosis not present

## 2017-05-08 DIAGNOSIS — I110 Hypertensive heart disease with heart failure: Secondary | ICD-10-CM | POA: Diagnosis not present

## 2017-05-08 DIAGNOSIS — J9690 Respiratory failure, unspecified, unspecified whether with hypoxia or hypercapnia: Secondary | ICD-10-CM | POA: Diagnosis not present

## 2017-05-08 DIAGNOSIS — I481 Persistent atrial fibrillation: Secondary | ICD-10-CM | POA: Diagnosis not present

## 2017-05-08 DIAGNOSIS — I5042 Chronic combined systolic (congestive) and diastolic (congestive) heart failure: Secondary | ICD-10-CM | POA: Diagnosis not present

## 2017-05-08 DIAGNOSIS — S32302D Unspecified fracture of left ilium, subsequent encounter for fracture with routine healing: Secondary | ICD-10-CM | POA: Diagnosis not present

## 2017-05-12 DIAGNOSIS — J9690 Respiratory failure, unspecified, unspecified whether with hypoxia or hypercapnia: Secondary | ICD-10-CM | POA: Diagnosis not present

## 2017-05-12 DIAGNOSIS — I481 Persistent atrial fibrillation: Secondary | ICD-10-CM | POA: Diagnosis not present

## 2017-05-12 DIAGNOSIS — I739 Peripheral vascular disease, unspecified: Secondary | ICD-10-CM | POA: Diagnosis not present

## 2017-05-12 DIAGNOSIS — S32302D Unspecified fracture of left ilium, subsequent encounter for fracture with routine healing: Secondary | ICD-10-CM | POA: Diagnosis not present

## 2017-05-12 DIAGNOSIS — I110 Hypertensive heart disease with heart failure: Secondary | ICD-10-CM | POA: Diagnosis not present

## 2017-05-12 DIAGNOSIS — I5042 Chronic combined systolic (congestive) and diastolic (congestive) heart failure: Secondary | ICD-10-CM | POA: Diagnosis not present

## 2017-05-14 DIAGNOSIS — I739 Peripheral vascular disease, unspecified: Secondary | ICD-10-CM | POA: Diagnosis not present

## 2017-05-14 DIAGNOSIS — I5042 Chronic combined systolic (congestive) and diastolic (congestive) heart failure: Secondary | ICD-10-CM | POA: Diagnosis not present

## 2017-05-14 DIAGNOSIS — J9690 Respiratory failure, unspecified, unspecified whether with hypoxia or hypercapnia: Secondary | ICD-10-CM | POA: Diagnosis not present

## 2017-05-14 DIAGNOSIS — I110 Hypertensive heart disease with heart failure: Secondary | ICD-10-CM | POA: Diagnosis not present

## 2017-05-14 DIAGNOSIS — S32302D Unspecified fracture of left ilium, subsequent encounter for fracture with routine healing: Secondary | ICD-10-CM | POA: Diagnosis not present

## 2017-05-14 DIAGNOSIS — I481 Persistent atrial fibrillation: Secondary | ICD-10-CM | POA: Diagnosis not present

## 2017-05-15 DIAGNOSIS — I2510 Atherosclerotic heart disease of native coronary artery without angina pectoris: Secondary | ICD-10-CM | POA: Diagnosis not present

## 2017-05-15 DIAGNOSIS — F0390 Unspecified dementia without behavioral disturbance: Secondary | ICD-10-CM | POA: Diagnosis not present

## 2017-05-15 DIAGNOSIS — I5020 Unspecified systolic (congestive) heart failure: Secondary | ICD-10-CM | POA: Diagnosis not present

## 2017-05-15 DIAGNOSIS — R21 Rash and other nonspecific skin eruption: Secondary | ICD-10-CM | POA: Diagnosis not present

## 2017-05-16 DIAGNOSIS — I110 Hypertensive heart disease with heart failure: Secondary | ICD-10-CM | POA: Diagnosis not present

## 2017-05-16 DIAGNOSIS — I481 Persistent atrial fibrillation: Secondary | ICD-10-CM | POA: Diagnosis not present

## 2017-05-16 DIAGNOSIS — I5042 Chronic combined systolic (congestive) and diastolic (congestive) heart failure: Secondary | ICD-10-CM | POA: Diagnosis not present

## 2017-05-16 DIAGNOSIS — J9690 Respiratory failure, unspecified, unspecified whether with hypoxia or hypercapnia: Secondary | ICD-10-CM | POA: Diagnosis not present

## 2017-05-16 DIAGNOSIS — S32302D Unspecified fracture of left ilium, subsequent encounter for fracture with routine healing: Secondary | ICD-10-CM | POA: Diagnosis not present

## 2017-05-16 DIAGNOSIS — I739 Peripheral vascular disease, unspecified: Secondary | ICD-10-CM | POA: Diagnosis not present

## 2017-05-19 DIAGNOSIS — I739 Peripheral vascular disease, unspecified: Secondary | ICD-10-CM | POA: Diagnosis not present

## 2017-05-19 DIAGNOSIS — I110 Hypertensive heart disease with heart failure: Secondary | ICD-10-CM | POA: Diagnosis not present

## 2017-05-19 DIAGNOSIS — S32302D Unspecified fracture of left ilium, subsequent encounter for fracture with routine healing: Secondary | ICD-10-CM | POA: Diagnosis not present

## 2017-05-19 DIAGNOSIS — I5042 Chronic combined systolic (congestive) and diastolic (congestive) heart failure: Secondary | ICD-10-CM | POA: Diagnosis not present

## 2017-05-19 DIAGNOSIS — J9690 Respiratory failure, unspecified, unspecified whether with hypoxia or hypercapnia: Secondary | ICD-10-CM | POA: Diagnosis not present

## 2017-05-19 DIAGNOSIS — I481 Persistent atrial fibrillation: Secondary | ICD-10-CM | POA: Diagnosis not present

## 2017-05-20 DIAGNOSIS — I481 Persistent atrial fibrillation: Secondary | ICD-10-CM | POA: Diagnosis not present

## 2017-05-20 DIAGNOSIS — J9690 Respiratory failure, unspecified, unspecified whether with hypoxia or hypercapnia: Secondary | ICD-10-CM | POA: Diagnosis not present

## 2017-05-20 DIAGNOSIS — S32302D Unspecified fracture of left ilium, subsequent encounter for fracture with routine healing: Secondary | ICD-10-CM | POA: Diagnosis not present

## 2017-05-20 DIAGNOSIS — I739 Peripheral vascular disease, unspecified: Secondary | ICD-10-CM | POA: Diagnosis not present

## 2017-05-20 DIAGNOSIS — I5042 Chronic combined systolic (congestive) and diastolic (congestive) heart failure: Secondary | ICD-10-CM | POA: Diagnosis not present

## 2017-05-20 DIAGNOSIS — I110 Hypertensive heart disease with heart failure: Secondary | ICD-10-CM | POA: Diagnosis not present

## 2017-05-22 DIAGNOSIS — I481 Persistent atrial fibrillation: Secondary | ICD-10-CM | POA: Diagnosis not present

## 2017-05-22 DIAGNOSIS — J9690 Respiratory failure, unspecified, unspecified whether with hypoxia or hypercapnia: Secondary | ICD-10-CM | POA: Diagnosis not present

## 2017-05-22 DIAGNOSIS — S32302D Unspecified fracture of left ilium, subsequent encounter for fracture with routine healing: Secondary | ICD-10-CM | POA: Diagnosis not present

## 2017-05-22 DIAGNOSIS — I5042 Chronic combined systolic (congestive) and diastolic (congestive) heart failure: Secondary | ICD-10-CM | POA: Diagnosis not present

## 2017-05-22 DIAGNOSIS — I110 Hypertensive heart disease with heart failure: Secondary | ICD-10-CM | POA: Diagnosis not present

## 2017-05-22 DIAGNOSIS — I739 Peripheral vascular disease, unspecified: Secondary | ICD-10-CM | POA: Diagnosis not present

## 2017-05-26 DIAGNOSIS — I481 Persistent atrial fibrillation: Secondary | ICD-10-CM | POA: Diagnosis not present

## 2017-05-26 DIAGNOSIS — S32302D Unspecified fracture of left ilium, subsequent encounter for fracture with routine healing: Secondary | ICD-10-CM | POA: Diagnosis not present

## 2017-05-26 DIAGNOSIS — I110 Hypertensive heart disease with heart failure: Secondary | ICD-10-CM | POA: Diagnosis not present

## 2017-05-26 DIAGNOSIS — I739 Peripheral vascular disease, unspecified: Secondary | ICD-10-CM | POA: Diagnosis not present

## 2017-05-26 DIAGNOSIS — J9690 Respiratory failure, unspecified, unspecified whether with hypoxia or hypercapnia: Secondary | ICD-10-CM | POA: Diagnosis not present

## 2017-05-26 DIAGNOSIS — I5042 Chronic combined systolic (congestive) and diastolic (congestive) heart failure: Secondary | ICD-10-CM | POA: Diagnosis not present

## 2017-05-27 ENCOUNTER — Encounter (HOSPITAL_COMMUNITY): Payer: Self-pay | Admitting: Emergency Medicine

## 2017-05-27 ENCOUNTER — Emergency Department (HOSPITAL_COMMUNITY): Payer: Medicare Other

## 2017-05-27 ENCOUNTER — Inpatient Hospital Stay (HOSPITAL_COMMUNITY)
Admission: EM | Admit: 2017-05-27 | Discharge: 2017-06-02 | DRG: 314 | Disposition: A | Discharge status: Home Health | Payer: Medicare Other | Attending: Internal Medicine | Admitting: Internal Medicine

## 2017-05-27 DIAGNOSIS — E785 Hyperlipidemia, unspecified: Secondary | ICD-10-CM | POA: Diagnosis present

## 2017-05-27 DIAGNOSIS — I480 Paroxysmal atrial fibrillation: Secondary | ICD-10-CM | POA: Diagnosis present

## 2017-05-27 DIAGNOSIS — I472 Ventricular tachycardia: Secondary | ICD-10-CM | POA: Diagnosis not present

## 2017-05-27 DIAGNOSIS — Z471 Aftercare following joint replacement surgery: Secondary | ICD-10-CM | POA: Diagnosis not present

## 2017-05-27 DIAGNOSIS — M25552 Pain in left hip: Secondary | ICD-10-CM | POA: Diagnosis present

## 2017-05-27 DIAGNOSIS — B9620 Unspecified Escherichia coli [E. coli] as the cause of diseases classified elsewhere: Secondary | ICD-10-CM | POA: Diagnosis not present

## 2017-05-27 DIAGNOSIS — A419 Sepsis, unspecified organism: Secondary | ICD-10-CM | POA: Diagnosis present

## 2017-05-27 DIAGNOSIS — R6510 Systemic inflammatory response syndrome (SIRS) of non-infectious origin without acute organ dysfunction: Secondary | ICD-10-CM | POA: Diagnosis present

## 2017-05-27 DIAGNOSIS — Z955 Presence of coronary angioplasty implant and graft: Secondary | ICD-10-CM | POA: Diagnosis not present

## 2017-05-27 DIAGNOSIS — N184 Chronic kidney disease, stage 4 (severe): Secondary | ICD-10-CM | POA: Diagnosis present

## 2017-05-27 DIAGNOSIS — Z681 Body mass index (BMI) 19 or less, adult: Secondary | ICD-10-CM | POA: Diagnosis not present

## 2017-05-27 DIAGNOSIS — M7989 Other specified soft tissue disorders: Secondary | ICD-10-CM | POA: Diagnosis not present

## 2017-05-27 DIAGNOSIS — E876 Hypokalemia: Secondary | ICD-10-CM | POA: Diagnosis present

## 2017-05-27 DIAGNOSIS — I5022 Chronic systolic (congestive) heart failure: Secondary | ICD-10-CM

## 2017-05-27 DIAGNOSIS — Z8249 Family history of ischemic heart disease and other diseases of the circulatory system: Secondary | ICD-10-CM | POA: Diagnosis not present

## 2017-05-27 DIAGNOSIS — H9190 Unspecified hearing loss, unspecified ear: Secondary | ICD-10-CM | POA: Diagnosis present

## 2017-05-27 DIAGNOSIS — I130 Hypertensive heart and chronic kidney disease with heart failure and stage 1 through stage 4 chronic kidney disease, or unspecified chronic kidney disease: Secondary | ICD-10-CM | POA: Diagnosis present

## 2017-05-27 DIAGNOSIS — Z79899 Other long term (current) drug therapy: Secondary | ICD-10-CM | POA: Diagnosis not present

## 2017-05-27 DIAGNOSIS — F0390 Unspecified dementia without behavioral disturbance: Secondary | ICD-10-CM | POA: Diagnosis present

## 2017-05-27 DIAGNOSIS — I2510 Atherosclerotic heart disease of native coronary artery without angina pectoris: Secondary | ICD-10-CM | POA: Diagnosis present

## 2017-05-27 DIAGNOSIS — I350 Nonrheumatic aortic (valve) stenosis: Secondary | ICD-10-CM | POA: Diagnosis not present

## 2017-05-27 DIAGNOSIS — I739 Peripheral vascular disease, unspecified: Secondary | ICD-10-CM | POA: Diagnosis present

## 2017-05-27 DIAGNOSIS — Z885 Allergy status to narcotic agent status: Secondary | ICD-10-CM

## 2017-05-27 DIAGNOSIS — I4891 Unspecified atrial fibrillation: Secondary | ICD-10-CM | POA: Diagnosis present

## 2017-05-27 DIAGNOSIS — R9431 Abnormal electrocardiogram [ECG] [EKG]: Secondary | ICD-10-CM | POA: Diagnosis not present

## 2017-05-27 DIAGNOSIS — N390 Urinary tract infection, site not specified: Secondary | ICD-10-CM | POA: Diagnosis not present

## 2017-05-27 DIAGNOSIS — E43 Unspecified severe protein-calorie malnutrition: Secondary | ICD-10-CM | POA: Diagnosis present

## 2017-05-27 DIAGNOSIS — F419 Anxiety disorder, unspecified: Secondary | ICD-10-CM | POA: Diagnosis not present

## 2017-05-27 DIAGNOSIS — T80211A Bloodstream infection due to central venous catheter, initial encounter: Secondary | ICD-10-CM | POA: Diagnosis not present

## 2017-05-27 DIAGNOSIS — I5023 Acute on chronic systolic (congestive) heart failure: Secondary | ICD-10-CM | POA: Diagnosis not present

## 2017-05-27 DIAGNOSIS — Z91018 Allergy to other foods: Secondary | ICD-10-CM

## 2017-05-27 DIAGNOSIS — D62 Acute posthemorrhagic anemia: Secondary | ICD-10-CM | POA: Diagnosis not present

## 2017-05-27 DIAGNOSIS — I482 Chronic atrial fibrillation: Secondary | ICD-10-CM | POA: Diagnosis not present

## 2017-05-27 DIAGNOSIS — I5084 End stage heart failure: Secondary | ICD-10-CM | POA: Diagnosis present

## 2017-05-27 DIAGNOSIS — Z96642 Presence of left artificial hip joint: Secondary | ICD-10-CM | POA: Diagnosis present

## 2017-05-27 DIAGNOSIS — R509 Fever, unspecified: Secondary | ICD-10-CM | POA: Diagnosis not present

## 2017-05-27 LAB — CBC
HCT: 32.3 % — ABNORMAL LOW (ref 36.0–46.0)
Hemoglobin: 10.6 g/dL — ABNORMAL LOW (ref 12.0–15.0)
MCH: 27.9 pg (ref 26.0–34.0)
MCHC: 32.8 g/dL (ref 30.0–36.0)
MCV: 85 fL (ref 78.0–100.0)
Platelets: 306 10*3/uL (ref 150–400)
RBC: 3.8 MIL/uL — ABNORMAL LOW (ref 3.87–5.11)
RDW: 14.2 % (ref 11.5–15.5)
WBC: 15 10*3/uL — ABNORMAL HIGH (ref 4.0–10.5)

## 2017-05-27 LAB — CBC WITH DIFFERENTIAL/PLATELET
Basophils Absolute: 0 10*3/uL (ref 0.0–0.1)
Basophils Relative: 0 %
Eosinophils Absolute: 0 10*3/uL (ref 0.0–0.7)
Eosinophils Relative: 0 %
HCT: 20.7 % — ABNORMAL LOW (ref 36.0–46.0)
Hemoglobin: 6.7 g/dL — CL (ref 12.0–15.0)
Lymphocytes Relative: 2 %
Lymphs Abs: 0.5 10*3/uL — ABNORMAL LOW (ref 0.7–4.0)
MCH: 28.2 pg (ref 26.0–34.0)
MCHC: 32.4 g/dL (ref 30.0–36.0)
MCV: 87 fL (ref 78.0–100.0)
Monocytes Absolute: 1 10*3/uL (ref 0.1–1.0)
Monocytes Relative: 5 %
Neutro Abs: 18.3 10*3/uL — ABNORMAL HIGH (ref 1.7–7.7)
Neutrophils Relative %: 92 %
Platelets: 417 10*3/uL — ABNORMAL HIGH (ref 150–400)
RBC: 2.38 MIL/uL — ABNORMAL LOW (ref 3.87–5.11)
RDW: 14.6 % (ref 11.5–15.5)
WBC: 19.8 10*3/uL — ABNORMAL HIGH (ref 4.0–10.5)

## 2017-05-27 LAB — URINALYSIS, ROUTINE W REFLEX MICROSCOPIC
Bilirubin Urine: NEGATIVE
Glucose, UA: NEGATIVE mg/dL
Ketones, ur: NEGATIVE mg/dL
Nitrite: NEGATIVE
Protein, ur: 30 mg/dL — AB
Specific Gravity, Urine: 1.006 (ref 1.005–1.030)
pH: 6 (ref 5.0–8.0)

## 2017-05-27 LAB — I-STAT CHEM 8, ED
BUN: 27 mg/dL — ABNORMAL HIGH (ref 6–20)
Calcium, Ion: 1.03 mmol/L — ABNORMAL LOW (ref 1.15–1.40)
Chloride: 99 mmol/L — ABNORMAL LOW (ref 101–111)
Creatinine, Ser: 1 mg/dL (ref 0.44–1.00)
Glucose, Bld: 100 mg/dL — ABNORMAL HIGH (ref 65–99)
HCT: 18 % — ABNORMAL LOW (ref 36.0–46.0)
Hemoglobin: 6.1 g/dL — CL (ref 12.0–15.0)
Potassium: 3.1 mmol/L — ABNORMAL LOW (ref 3.5–5.1)
Sodium: 134 mmol/L — ABNORMAL LOW (ref 135–145)
TCO2: 23 mmol/L (ref 0–100)

## 2017-05-27 LAB — MRSA PCR SCREENING: MRSA by PCR: NEGATIVE

## 2017-05-27 LAB — COMPREHENSIVE METABOLIC PANEL
ALT: 10 U/L — ABNORMAL LOW (ref 14–54)
AST: 18 U/L (ref 15–41)
Albumin: 3.1 g/dL — ABNORMAL LOW (ref 3.5–5.0)
Alkaline Phosphatase: 84 U/L (ref 38–126)
Anion gap: 10 (ref 5–15)
BUN: 28 mg/dL — ABNORMAL HIGH (ref 6–20)
CO2: 24 mmol/L (ref 22–32)
Calcium: 8.7 mg/dL — ABNORMAL LOW (ref 8.9–10.3)
Chloride: 100 mmol/L — ABNORMAL LOW (ref 101–111)
Creatinine, Ser: 1.07 mg/dL — ABNORMAL HIGH (ref 0.44–1.00)
GFR calc Af Amer: 51 mL/min — ABNORMAL LOW (ref >60)
GFR calc non Af Amer: 44 mL/min — ABNORMAL LOW (ref >60)
Glucose, Bld: 100 mg/dL — ABNORMAL HIGH (ref 65–99)
Potassium: 3.1 mmol/L — ABNORMAL LOW (ref 3.5–5.1)
Sodium: 134 mmol/L — ABNORMAL LOW (ref 135–145)
Total Bilirubin: 0.6 mg/dL (ref 0.3–1.2)
Total Protein: 6.1 g/dL — ABNORMAL LOW (ref 6.5–8.1)

## 2017-05-27 LAB — BLOOD CULTURE ID PANEL (REFLEXED)

## 2017-05-27 LAB — BASIC METABOLIC PANEL
Anion gap: 9 (ref 5–15)
BUN: 26 mg/dL — ABNORMAL HIGH (ref 6–20)
CO2: 26 mmol/L (ref 22–32)
Calcium: 8.5 mg/dL — ABNORMAL LOW (ref 8.9–10.3)
Chloride: 102 mmol/L (ref 101–111)
Creatinine, Ser: 1.11 mg/dL — ABNORMAL HIGH (ref 0.44–1.00)
GFR calc Af Amer: 49 mL/min — ABNORMAL LOW (ref >60)
GFR calc non Af Amer: 42 mL/min — ABNORMAL LOW (ref >60)
Glucose, Bld: 92 mg/dL (ref 65–99)
Potassium: 3.4 mmol/L — ABNORMAL LOW (ref 3.5–5.1)
Sodium: 137 mmol/L (ref 135–145)

## 2017-05-27 LAB — LACTIC ACID, PLASMA: Lactic Acid, Venous: 0.8 mmol/L (ref 0.5–1.9)

## 2017-05-27 LAB — PREPARE RBC (CROSSMATCH)

## 2017-05-27 LAB — I-STAT CG4 LACTIC ACID, ED: Lactic Acid, Venous: 0.98 mmol/L (ref 0.5–1.9)

## 2017-05-27 LAB — BRAIN NATRIURETIC PEPTIDE: B Natriuretic Peptide: 384 pg/mL — ABNORMAL HIGH (ref 0.0–100.0)

## 2017-05-27 LAB — COOXEMETRY PANEL
Carboxyhemoglobin: 1.8 % — ABNORMAL HIGH (ref 0.5–1.5)
Methemoglobin: 1.2 % (ref 0.0–1.5)
O2 Saturation: 75.8 %
Total hemoglobin: 11.4 g/dL — ABNORMAL LOW (ref 12.0–16.0)

## 2017-05-27 LAB — POC OCCULT BLOOD, ED: Fecal Occult Bld: NEGATIVE

## 2017-05-27 LAB — PROCALCITONIN: Procalcitonin: 40.6 ng/mL

## 2017-05-27 MED ORDER — SODIUM CHLORIDE 0.9 % IV SOLN: Freq: Once | INTRAVENOUS | Status: DC

## 2017-05-27 MED ORDER — BUSPIRONE HCL 5 MG PO TABS
5.0000 mg | ORAL_TABLET | Freq: Every day | ORAL | Status: DC | PRN
  Administered 2017-05-27 – 2017-06-01 (×4): 5 mg via ORAL
  Filled 2017-05-27 (×5): qty 1

## 2017-05-27 MED ORDER — MILRINONE LACTATE IN DEXTROSE 20-5 MG/100ML-% IV SOLN: 0.2500 ug/kg/min | INTRAVENOUS | Status: DC

## 2017-05-27 MED ORDER — VANCOMYCIN HCL IN DEXTROSE 1-5 GM/200ML-% IV SOLN
1000.0000 mg | Freq: Once | INTRAVENOUS | Status: AC
  Administered 2017-05-27: 1000 mg via INTRAVENOUS
  Filled 2017-05-27: qty 200

## 2017-05-27 MED ORDER — VANCOMYCIN HCL 500 MG IV SOLR
500.0000 mg | INTRAVENOUS | Status: DC
  Filled 2017-05-27: qty 500

## 2017-05-27 MED ORDER — AMIODARONE HCL 100 MG PO TABS
100.0000 mg | ORAL_TABLET | Freq: Every day | ORAL | Status: DC
  Administered 2017-05-27 – 2017-06-02 (×7): 100 mg via ORAL
  Filled 2017-05-27 (×7): qty 1

## 2017-05-27 MED ORDER — SODIUM CHLORIDE 0.9 % IV BOLUS (SEPSIS): 1000.0000 mL | Freq: Once | INTRAVENOUS | Status: DC

## 2017-05-27 MED ORDER — DEXTROSE 5 % IV SOLN
2.0000 g | INTRAVENOUS | Status: DC
  Administered 2017-05-27 – 2017-05-28 (×2): 2 g via INTRAVENOUS
  Filled 2017-05-27 (×3): qty 2

## 2017-05-27 MED ORDER — POTASSIUM CHLORIDE CRYS ER 20 MEQ PO TBCR: 40.0000 meq | EXTENDED_RELEASE_TABLET | ORAL | Status: DC

## 2017-05-27 MED ORDER — MILRINONE LACTATE IN DEXTROSE 20-5 MG/100ML-% IV SOLN
0.1250 ug/kg/min | INTRAVENOUS | Status: DC
  Administered 2017-05-27 – 2017-06-01 (×4): 0.125 ug/kg/min via INTRAVENOUS
  Filled 2017-05-27 (×5): qty 100

## 2017-05-27 MED ORDER — ONDANSETRON HCL 4 MG/2ML IJ SOLN: 4.0000 mg | Freq: Four times a day (QID) | INTRAMUSCULAR | Status: DC | PRN

## 2017-05-27 MED ORDER — LISINOPRIL 2.5 MG PO TABS: 2.5000 mg | ORAL_TABLET | Freq: Every day | ORAL | Status: DC

## 2017-05-27 MED ORDER — ONDANSETRON HCL 4 MG PO TABS: 4.0000 mg | ORAL_TABLET | Freq: Four times a day (QID) | ORAL | Status: DC | PRN

## 2017-05-27 MED ORDER — PIPERACILLIN-TAZOBACTAM IN DEX 2-0.25 GM/50ML IV SOLN
2.2500 g | Freq: Four times a day (QID) | INTRAVENOUS | Status: DC
  Administered 2017-05-27: 2.25 g via INTRAVENOUS
  Filled 2017-05-27 (×5): qty 50

## 2017-05-27 MED ORDER — ACETAMINOPHEN 650 MG RE SUPP: 650.0000 mg | Freq: Four times a day (QID) | RECTAL | Status: DC | PRN

## 2017-05-27 MED ORDER — TORSEMIDE 20 MG PO TABS
40.0000 mg | ORAL_TABLET | Freq: Two times a day (BID) | ORAL | Status: DC
  Administered 2017-05-27 – 2017-06-02 (×13): 40 mg via ORAL
  Filled 2017-05-27 (×13): qty 2

## 2017-05-27 MED ORDER — PIPERACILLIN-TAZOBACTAM 3.375 G IVPB 30 MIN
3.3750 g | Freq: Once | INTRAVENOUS | Status: AC
  Administered 2017-05-27: 3.375 g via INTRAVENOUS
  Filled 2017-05-27: qty 50

## 2017-05-27 MED ORDER — MILRINONE LACTATE IN DEXTROSE 20-5 MG/100ML-% IV SOLN: 0.1250 ug/kg/min | INTRAVENOUS | Status: DC

## 2017-05-27 MED ORDER — ACETAMINOPHEN 325 MG PO TABS
650.0000 mg | ORAL_TABLET | Freq: Four times a day (QID) | ORAL | Status: DC | PRN
  Administered 2017-05-28: 650 mg via ORAL
  Filled 2017-05-27: qty 2

## 2017-05-27 MED ORDER — FUROSEMIDE 10 MG/ML IJ SOLN: 20.0000 mg | Freq: Once | INTRAMUSCULAR | Status: DC

## 2017-05-27 MED ORDER — ALBUTEROL SULFATE (2.5 MG/3ML) 0.083% IN NEBU: 2.5000 mg | INHALATION_SOLUTION | RESPIRATORY_TRACT | Status: DC | PRN

## 2017-05-27 MED ORDER — ACETAMINOPHEN 500 MG PO TABS
1000.0000 mg | ORAL_TABLET | Freq: Three times a day (TID) | ORAL | Status: DC
  Administered 2017-05-27 – 2017-06-02 (×20): 1000 mg via ORAL
  Filled 2017-05-27 (×20): qty 2

## 2017-05-27 MED ORDER — ACETAMINOPHEN 500 MG PO TABS
1000.0000 mg | ORAL_TABLET | Freq: Once | ORAL | Status: AC
  Administered 2017-05-27: 1000 mg via ORAL
  Filled 2017-05-27: qty 2

## 2017-05-27 MED ORDER — SODIUM CHLORIDE 0.9 % IV BOLUS (SEPSIS): 250.0000 mL | Freq: Once | INTRAVENOUS | Status: DC

## 2017-05-27 NOTE — ED Notes (Signed)
Attempted report x1. 
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
## 2017-05-27 NOTE — Progress Notes (Signed)
[facility]  Jacqueline Reilly is an active with [facility] for home Milrinone.  [place] services thru [facility].  [facility] hospital team will follow to support transition home when ordered.  If patient discharges after hours, please call [phone].   [name] 05/27/2017, 10:04 AM

## 2017-05-27 NOTE — Progress Notes (Signed)
Pharmacy Antibiotic Note  Jacqueline Reilly is a 83 y.o. female admitted on 05/27/2017 with sepsis.  Pharmacy has been consulted for vancomycin and zosyn dosing. Tmax is 102.3 and WBC is elevated at 15, Scr is WNL. First doses per MD.   Plan: Vancomycin 500mg  IV Q24H Zosyn 2.25gm IV Q6H F/u renal fxn, C&S, clinical status and trough at SS  Height: 4\' 9"  (144.8 cm) Weight: 89 lb 11.6 oz (40.7 kg) IBW/kg (Calculated) : 38.6  Temp (24hrs), Avg:100 F (37.8 C), Min:98.9 F (37.2 C), Max:102.3 F (39.1 C)   Recent Labs Lab 05/27/17 0128 05/27/17 0135 05/27/17 0136 05/27/17 1036  WBC 19.8*  --   --  15.0*  CREATININE 1.07* 1.00  --   --   LATICACIDVEN  --   --  0.98  --     Estimated Creatinine Clearance: 22.3 mL/min (by C-G formula based on SCr of 1 mg/dL).    Allergies  Allergen Reactions  . Codeine Nausea Only  . Garlic Nausea Only    Antimicrobials this admission: Vanc 7/31>> Zosyn 7/31>>  Dose adjustments this admission: N/A  Microbiology results: Pending  Thank you for allowing pharmacy to be a part of this patient's care.  Patric Vanpelt, Rande Lawman 05/27/2017 11:09 AM

## 2017-05-27 NOTE — ED Notes (Signed)
Informed RN of Code Sepsis

## 2017-05-27 NOTE — ED Triage Notes (Signed)
Patient arrives with left hip pain and fever from home. EMS reported that patient had left hip pain, but patient currently denies stating "not terribly". Fever at home >102, EMS reported temporal temp @ 103.2, arrival temperature here @ 102.3 oral. Patient on continuous milrinone infusion upon arrival. Hx of hip replacement on the left.
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
## 2017-05-27 NOTE — ED Notes (Signed)
Challenging IV start.

## 2017-05-27 NOTE — ED Notes (Signed)
Verified with main lab- pt cannot have labs drawn until 2 hours after blood transfusion is complete.

## 2017-05-27 NOTE — ED Notes (Signed)
MD Palumbo informed of consecutive low BP readings. At this time MD advises no further action and to continue monitoring.

## 2017-05-27 NOTE — Progress Notes (Signed)
PHARMACY - PHYSICIAN COMMUNICATION CRITICAL VALUE ALERT - BLOOD CULTURE IDENTIFICATION (BCID)  Results for orders placed or performed during the hospital encounter of 05/27/17  Blood Culture ID Panel (Reflexed) (Collected: 05/27/2017  1:26 AM)  Result Value Ref Range   Enterococcus species NOT DETECTED NOT DETECTED   Listeria monocytogenes NOT DETECTED NOT DETECTED   Staphylococcus species NOT DETECTED NOT DETECTED   Staphylococcus aureus NOT DETECTED NOT DETECTED   Streptococcus species NOT DETECTED NOT DETECTED   Streptococcus agalactiae NOT DETECTED NOT DETECTED   Streptococcus pneumoniae NOT DETECTED NOT DETECTED   Streptococcus pyogenes NOT DETECTED NOT DETECTED   Acinetobacter baumannii NOT DETECTED NOT DETECTED   Enterobacteriaceae species DETECTED (A) NOT DETECTED   Enterobacter cloacae complex NOT DETECTED NOT DETECTED   Escherichia coli DETECTED (A) NOT DETECTED   Klebsiella oxytoca NOT DETECTED NOT DETECTED   Klebsiella pneumoniae NOT DETECTED NOT DETECTED   Proteus species NOT DETECTED NOT DETECTED   Serratia marcescens NOT DETECTED NOT DETECTED   Carbapenem resistance NOT DETECTED NOT DETECTED   Haemophilus influenzae NOT DETECTED NOT DETECTED   Neisseria meningitidis NOT DETECTED NOT DETECTED   Pseudomonas aeruginosa NOT DETECTED NOT DETECTED   Candida albicans NOT DETECTED NOT DETECTED   Candida glabrata NOT DETECTED NOT DETECTED   Candida krusei NOT DETECTED NOT DETECTED   Candida parapsilosis NOT DETECTED NOT DETECTED   Candida tropicalis NOT DETECTED NOT DETECTED   Pt on vancomycin/Zosyn day #1 for probable sepsis with suspected bacteremia. BCx 1/2 with GNR (BCID + for Ecoli). 7/31 Cxr pneumonitits v edema. Paged MD to consider de-escalation to ceftriaxone 2g IV q24h.  Name of physician (or Provider) Contacted: Baltazar Najjar  Changes to prescribed antibiotics required: vanc/zosyn > ceftriaxone 2g IV q24h   Elicia Lamp, PharmD, BCPS Clinical Pharmacist 05/27/2017 7:40  PM

## 2017-05-27 NOTE — ED Notes (Signed)
Spoke with Dr. Rockne Menghini, concerning orders for lasix and torsemide this morning. Pt continues to be hypotensive. Per verbal order, will hold diuretics this morning as pt is not in resp distress. Fine crackles in bilateral lower lungs noted. MD aware.

## 2017-05-27 NOTE — H&P (Signed)
History and Physical    Jacqueline Reilly DZH:[id number] DOB: 1924-05-26 DOA: 05/27/2017  Referring MD/NP/PA: Dr. Veatrice Kells PCP: [name], MD  Patient coming from: Home  Chief Complaint: Fever  HPI: Jacqueline Reilly is a 83 y.o. female with medical history significant of HTN, HLD, Afib, CHF last EF 20-25%, CAD s/p PCI, PVD, AAA, RAS; who presents with complaints of fever. Patient's son who acts as a caregiver helps provide history is as she is very hard of hearing. Patient was noted to have fever up to 102F and chills at home. She just recently had left hip surgery approximately 2 months ago, and has been doing physical therapy at home. Family notes no significant erythema or signs of infection to her knowledge. Patient has been complaining of some left hip pain intermittently, but complained of significantly worse pain last night. Son also reports that the patient had gotten herself tangled up 2 nights ago, and he had to disconnect her reconnect her milrinone IV. He is unsure if he performed this critically as he had never had to do  this before. Otherwise patient denies any significant cough, dark/bloody bowel movements, worsening shortness of breath, or change in weight.   ED Course:  Upon admission into the emergency department patient was noted to be febrile up to 102.38F, pulse 68-84, blood pressure as low as 86/47, and O2 saturation maintained on room air. Labs revealed WBC 19.8, hemoglobin 6.7 (previously 10.4 on 7/3), platelets 417, lactic acid 0.98, potassium 3.1, BUN 28, creatinine 1.07. Chest x-ray showing cardiomegaly with signs of a interstitial edema versus pneumonitis. Patient was placed on empiric antibiotics of vancomycin and Zosyn.  Review of Systems: Review of Systems  Constitutional: Positive for chills and fever.  HENT: Positive for hearing loss. Negative for congestion.   Eyes: Negative for pain.  Respiratory: Positive for shortness of breath. Negative for wheezing.     Cardiovascular: Negative for chest pain and orthopnea.  Gastrointestinal: Positive for nausea. Negative for abdominal pain, blood in stool and diarrhea.  Genitourinary: Negative for frequency and urgency.  Musculoskeletal: Negative for falls.  Skin: Negative for itching and rash.  Neurological: Negative for speech change, focal weakness and headaches.  Endo/Heme/Allergies: Bruises/bleeds easily.  Psychiatric/Behavioral: Positive for memory loss. Negative for substance abuse.    Past Medical History:  Diagnosis Date  . AAA (abdominal aortic aneurysm) ([place])   . Atrial fibrillation ([place])   . Chronic systolic heart failure ([employer])    a. ECHO (04/2014) EF 20-25%, diff HK, mild MR  . Coronary artery disease 2007   moderate ASCAD of the left system s/p PCI of the D2 and PCI of the left circ 03/2009  . Hyperlipidemia   . Hypertension   . PVD (peripheral vascular disease) ([place])   . Renal artery stenosis ([place])   . Ventricular dysfunction    left; ischemic    Past Surgical History:  Procedure Laterality Date  . ANGIOPLASTY    . ANTERIOR APPROACH HEMI HIP ARTHROPLASTY Left 03/05/2017   Procedure: ANTERIOR APPROACH LEFT HIP HEMI ARTHROPLASTY;  Surgeon: Leandrew Koyanagi, MD;  Location: [facility];  Service: Orthopedics;  Laterality: Left;  . CORONARY STENT PLACEMENT    . IR GENERIC HISTORICAL  09/25/2016   IR FLUORO GUIDE CV LINE RIGHT 09/25/2016 Ardis Rowan, PA-C [facility]  . retinal cryopexy     right eye (for retinal detachment)     reports that she has never smoked. She has never used smokeless tobacco. She reports  that she drinks alcohol. She reports that she does not use drugs.  Allergies  Allergen Reactions  . Codeine Nausea Only  . Garlic Nausea Only    Family History  Problem Relation Age of Onset  . Heart disease Mother   . Heart disease Father   . Heart disease Brother        x 3    Prior to Admission medications   Medication Sig Start Date End Date Taking?  Authorizing Provider  acetaminophen (TYLENOL) 500 MG tablet Take 1,000 mg by mouth 3 (three) times daily.   Yes [provider]  amiodarone (PACERONE) 100 MG tablet Take 100 mg by mouth daily.   Yes [provider]  apixaban (ELIQUIS) 2.5 MG TABS tablet Take 1 tablet (2.5 mg total) by mouth 2 (two) times daily. 11/22/16  Yes Bensimhon, Shaune Pascal, MD  busPIRone (BUSPAR) 5 MG tablet Take 5 mg by mouth daily as needed.   Yes [provider]  lisinopril (PRINIVIL,ZESTRIL) 2.5 MG tablet TAKE ONE TABLET BY MOUTH ONE TIME DAILY 11/21/16  Yes Bensimhon, Shaune Pascal, MD  milrinone (PRIMACOR) 20 MG/100 ML SOLN infusion Inject 0.125 mcg/kg/min into the vein continuous. 56 mg/ 237ml Weight = 41.6 kg   Yes [provider]  Multiple Vitamin (MULTIVITAMIN) tablet Take 1 tablet by mouth daily. 07/26/14  Yes Larey Dresser, MD  Omega-3 Fatty Acids (FISH OIL) 1000 MG CAPS Take 1 capsule by mouth 2 (two) times daily.    Yes [provider]  torsemide (DEMADEX) 20 MG tablet TAKE TWO TABLETS BY MOUTH TWICE DAILY 01/31/17  Yes Bensimhon, Shaune Pascal, MD  traMADol (ULTRAM) 50 MG tablet Take 25 mg by mouth every 8 (eight) hours as needed for moderate pain.   Yes [provider]  ondansetron (ZOFRAN ODT) 4 MG disintegrating tablet Take 1 tablet (4 mg total) by mouth every 8 (eight) hours as needed for nausea or vomiting. Patient not taking: Reported on 05/27/2017 04/29/17   Shary Decamp, PA-C    Physical Exam:  Constitutional:Thin elderly female who appears in no acute distress at this time Vitals:   05/27/17 0245 05/27/17 0300 05/27/17 0301 05/27/17 0315  BP: (!) 97/50 (!) 108/55 (!) 108/55 (!) 86/47  Pulse: 71 74 77 68  Resp: (!) 29 14 16  (!) 27  Temp:   98.9 F (37.2 C)   TempSrc:   Oral   SpO2: 98% 97% 99% 95%  Weight:      Height:       Eyes: PERRL, lids and conjunctivae normal ENMT: Mucous membranes are dry. Patient very hard of hearing. Posterior pharynx clear of  any exudate or lesions.  Neck: normal, supple, no masses, no thyromegaly Respiratory: Mildly tachypneic with positive crackles appreciated throughout both lung fields. No expiratory wheezes appreciated. Cardiovascular: Regular rate and rhythm, no murmurs / rubs / gallops. No extremity edema. 2+ pedal pulses. No carotid bruits.  Abdomen: no tenderness, no masses palpated. No hepatosplenomegaly. Bowel sounds positive.  Musculoskeletal: no clubbing / cyanosis. No joint deformity upper and lower extremities. Surgical wound of the left hip appears to be healing well with no signs of erythema or warmth. Mild tenderness to palpation of the left hip. Skin: no rashes, lesions, ulcers. No induration Neurologic: CN 2-12 grossly intact. Sensation intact, DTR normal. Strength 5/5 in all 4.  Psychiatric: Patient appears to be alert and oriented 3.    Labs on Admission: I have personally reviewed following labs and imaging studies  CBC:  Recent Labs Lab 05/27/17 0128 05/27/17 0135  WBC 19.8*  --   NEUTROABS 18.3*  --   HGB 6.7* 6.1*  HCT 20.7* 18.0*  MCV 87.0  --   PLT 417*  --    Basic Metabolic Panel:  Recent Labs Lab 05/27/17 0128 05/27/17 0135  NA 134* 134*  K 3.1* 3.1*  CL 100* 99*  CO2 24  --   GLUCOSE 100* 100*  BUN 28* 27*  CREATININE 1.07* 1.00  CALCIUM 8.7*  --    GFR: Estimated Creatinine Clearance: 22.3 mL/min (by C-G formula based on SCr of 1 mg/dL). Liver Function Tests:  Recent Labs Lab 05/27/17 0128  AST 18  ALT 10*  ALKPHOS 84  BILITOT 0.6  PROT 6.1*  ALBUMIN 3.1*   No results for input(s): LIPASE, AMYLASE in the last 168 hours. No results for input(s): AMMONIA in the last 168 hours. Coagulation Profile: No results for input(s): INR, PROTIME in the last 168 hours. Cardiac Enzymes: No results for input(s): CKTOTAL, CKMB, CKMBINDEX, TROPONINI in the last 168 hours. BNP (last 3 results) No results for input(s): PROBNP in the last 8760 hours. HbA1C: No  results for input(s): HGBA1C in the last 72 hours. CBG: No results for input(s): GLUCAP in the last 168 hours. Lipid Profile: No results for input(s): CHOL, HDL, LDLCALC, TRIG, CHOLHDL, LDLDIRECT in the last 72 hours. Thyroid Function Tests: No results for input(s): TSH, T4TOTAL, FREET4, T3FREE, THYROIDAB in the last 72 hours. Anemia Panel: No results for input(s): VITAMINB12, FOLATE, FERRITIN, TIBC, IRON, RETICCTPCT in the last 72 hours. Urine analysis:    Component Value Date/Time   COLORURINE STRAW (A) 05/27/2017 0052   APPEARANCEUR CLEAR 05/27/2017 0052   LABSPEC 1.006 05/27/2017 0052   PHURINE 6.0 05/27/2017 0052   GLUCOSEU NEGATIVE 05/27/2017 0052   HGBUR MODERATE (A) 05/27/2017 0052   BILIRUBINUR NEGATIVE 05/27/2017 0052   KETONESUR NEGATIVE 05/27/2017 0052   PROTEINUR 30 (A) 05/27/2017 0052   UROBILINOGEN 0.2 09/01/2015 1254   NITRITE NEGATIVE 05/27/2017 0052   LEUKOCYTESUR SMALL (A) 05/27/2017 0052   Sepsis Labs: No results found for this or any previous visit (from the past 240 hour(s)).   Radiological Exams on Admission: Dg Chest Port 1 View  Result Date: 05/27/2017 CLINICAL DATA:  Fever and left hip pain tonight. EXAM: PORTABLE CHEST 1 VIEW COMPARISON:  04/29/2017 FINDINGS: Shallow inspiration. Cardiac enlargement. Mild interstitial pattern to the lungs may indicate interstitial edema or pneumonitis. Pulmonary vascularity appears normal. No focal consolidation. No blunting of costophrenic angles. No pneumothorax. Calcified and tortuous aorta. Degenerative changes in the shoulders. Calcifications projected in or around the left shoulder may represent bone lesions such as enchondromas or synovial cartilage consistent with osteochondromatosis. IMPRESSION: Cardiac enlargement. Diffuse mild interstitial pattern to the lungs may represent edema or pneumonitis. Electronically Signed   By: Lucienne Capers M.D.   On: 05/27/2017 01:35   Dg Hip Unilat W Or Wo Pelvis 2-3 Views  Left  Result Date: 05/27/2017 CLINICAL DATA:  83 year old female with left hip pain and fever. History of hip replacement. EXAM: DG HIP (WITH OR WITHOUT PELVIS) 2-3V LEFT COMPARISON:  Intraoperative radiograph dated 03/05/2017 FINDINGS: There is a left hip hemiarthroplasty. The orthopedic hardware appears intact. No evidence of hardware loosening. There is no acute fracture or dislocation. The bones are osteopenic. There is osteoarthritic changes of the right hip. The soft tissues appear unremarkable. IMPRESSION: 1. No acute fracture or dislocation. 2. Left hip hemiarthroplasty without complication. Electronically Signed  By: Anner Crete M.D.   On: 05/27/2017 03:12    EKG: Independently reviewed. Sinus arrhythmia  Assessment/Plan SIRS/sepsis of unknown source: Acute. Patient found to be febrile up to 102F with WBC 19.2 a reassuring lactic acid level. Question possibility of blood infection given recent disconnection of IV milrinone. - Admit to a stepdown bed - Follow up blood cultures  - Continue empiric antibiotics of vancomycin and Zosyn  Acute blood loss anemia: Hemoglobin previously was noted around 10.2 earlier in the month, but presents with a hemoglobin of 6.7 on admission. Fecal occult negative. Suspect possibility of a diverticular bleed, now resolved. - T&S for need of blood products - Transfuse 1 unit of packed red blood cells - Give 20 mg of Lasix IV after started transfusion of blood  Acute on chronic systolic CHF: Last EF noted to be 30-35% by echocardiogram performed on 11/2016. BNP mildly elevated at 384 on admission. - Strict intake and output and daily weights - Continue milrinone , amiodarone, and Torosemide -Message sent to Gay [facility]iller to Notify Dr. Haroldine Laws the patient's admitted into the hospital  Hypokalemia: Acute. potassium 3.1 on admission.  - Give 40 mEq of potassium chloride 1 dose - Continue to monitor and replace as needed  Atrial fibrillation  -  Restart Eliquis, once medically appropriate  Anxiety - Continue buspirone  Left hip pain - The Tramadol as needed DVT prophylaxis: SCDs   Code Status: Full Family Communication: Discussed plan of care with the patient and family present at bedside Disposition Plan: Likely discharge home once medically stable Consults called: none Admission status: Inpatient   Norval Morton MD [facility] Pager [phone]   If 7PM-7AM, please contact night-coverage [email or web address] Password [facility]  05/27/2017, 3:46 AM

## 2017-05-27 NOTE — ED Notes (Signed)
Pt received breakfast tray 

## 2017-05-27 NOTE — ED Provider Notes (Signed)
[facility] DEPT Provider Note   CSN: [id number] Arrival date & time: 05/27/17  8242  By signing my name below, I, Jacqueline Reilly, attest that this documentation has been prepared under the direction and in the presence of Tynan, Stuti Sandin, MD. Electronically Signed: Lise Auer, ED Scribe. 05/27/17. 1:21 AM.   History   Chief Complaint Chief Complaint  Patient presents with  . Fever  . Hip Pain    The history is provided by the patient and a relative (son). No language interpreter was used.  Fever   This is a new problem. The current episode started 1 to 2 hours ago. The problem occurs constantly. The problem has not changed since onset.The maximum temperature noted was 102 to 102.9 F. Pertinent negatives include no chest pain, no diarrhea, no vomiting, no congestion and no cough. She has tried nothing for the symptoms. The treatment provided no relief.   HPI HPI Comments: Jacqueline Reilly is a 83 y.o. female with a PMHx of HTN, HLD, and PVD, brought in by ambulance, who presents to the Emergency Department with a fever of 102 and acute left hip pain. He notes associated chills. Per pt's son, he checked the pt's temperature and it was 102. He states recently she has been complaining of pain to her left where she had an arthoplasty back in May of 2018. She was given half of 50 mg Tramadol PO PTA. Denies dysuria, cough, congestion, urinary frequency, diarrhea, constipation, nausea or vomiting.   Past Medical History:  Diagnosis Date  . AAA (abdominal aortic aneurysm) ([place])   . Atrial fibrillation ([place])   . Chronic systolic heart failure ([place])    a. ECHO (04/2014) EF 20-25%, diff HK, mild MR  . Coronary artery disease 2007   moderate ASCAD of the left system s/p PCI of the D2 and PCI of the left circ 03/2009  . Hyperlipidemia   . Hypertension   . PVD (peripheral vascular disease) ([place])   . Renal artery stenosis ([place])   . Ventricular dysfunction    left; ischemic    Patient Active  Problem List   Diagnosis Date Noted  . Heart failure with reduced ejection fraction ([place]) 04/05/2017  . Receiving inotropic medication 04/05/2017  . Hypokalemia 04/05/2017  . Malnutrition of moderate degree 03/05/2017  . Left displaced femoral neck fracture ([place]) 03/04/2017  . Leukocytosis 03/04/2017  . Hypoxia   . Gram-negative bacteremia 09/01/2015  . Chronic anemia 09/01/2015  . Persistent atrial fibrillation ([place])   . Chronic systolic CHF (congestive heart failure) ([place]) 06/21/2015  . Atrial fibrillation ([place]) 08/10/2014  . Physical deconditioning 05/26/2014  . Acute respiratory failure with hypoxia ([place]) 05/08/2014  . NSTEMI, initial episode of care ([place]) 05/07/2014  . Atrial fibrillation with rapid ventricular response ([place]) 05/07/2014  . NSTEMI (non-ST elevated myocardial infarction) ([place]) 05/07/2014  . Abnormal chest x-ray 04/24/2014  . CHF (congestive heart failure) ([place]) 04/23/2014  . Acute on chronic combined systolic and diastolic congestive heart failure ([place]) 04/23/2014  . AAA (abdominal aortic aneurysm) ([place]) 10/07/2013  . SOB (shortness of breath) 09/30/2013  . Coronary artery disease   . Ischemic dilated cardiomyopathy ([place])   . Hypertension     Past Surgical History:  Procedure Laterality Date  . ANGIOPLASTY    . ANTERIOR APPROACH HEMI HIP ARTHROPLASTY Left 03/05/2017   Procedure: ANTERIOR APPROACH LEFT HIP HEMI ARTHROPLASTY;  Surgeon: Leandrew Koyanagi, MD;  Location: [facility];  Service: Orthopedics;  Laterality: Left;  . CORONARY STENT PLACEMENT    .  IR GENERIC HISTORICAL  09/25/2016   IR FLUORO GUIDE CV LINE RIGHT 09/25/2016 Ardis Rowan, PA-C [facility]  . retinal cryopexy     right eye (for retinal detachment)   OB History    No data available     Home Medications    Prior to Admission medications   Medication Sig Start Date End Date Taking? Authorizing Provider  acetaminophen (TYLENOL) 500 MG tablet Take 1,000 mg by mouth 3 (three) times daily.     [provider]  amiodarone (PACERONE) 100 MG tablet Take 100 mg by mouth daily.    [provider]  apixaban (ELIQUIS) 2.5 MG TABS tablet Take 1 tablet (2.5 mg total) by mouth 2 (two) times daily. 11/22/16   Bensimhon, Shaune Pascal, MD  busPIRone (BUSPAR) 5 MG tablet Take 5 mg by mouth daily as needed.    [provider]  docusate sodium (COLACE) 100 MG capsule Take 100 mg by mouth 2 (two) times daily.    [provider]  lisinopril (PRINIVIL,ZESTRIL) 2.5 MG tablet TAKE ONE TABLET BY MOUTH ONE TIME DAILY 11/21/16   Bensimhon, Shaune Pascal, MD  MILRINONE LACTATE IV Inject 1.25 mcg into the vein 3 (three) times daily.    [provider]  Multiple Vitamin (MULTIVITAMIN) tablet Take 1 tablet by mouth daily. 07/26/14   Larey Dresser, MD  NUTRITIONAL SUPPLEMENT LIQD Take by mouth. House 2.0 - Give 120cc by mouth three times a day    [provider]  Omega-3 Fatty Acids (FISH OIL) 1000 MG CAPS Take 1 capsule by mouth daily.    [provider]  ondansetron (ZOFRAN ODT) 4 MG disintegrating tablet Take 1 tablet (4 mg total) by mouth every 8 (eight) hours as needed for nausea or vomiting. 04/29/17   Shary Decamp, PA-C  torsemide (DEMADEX) 20 MG tablet TAKE TWO TABLETS BY MOUTH TWICE DAILY 01/31/17   Bensimhon, Shaune Pascal, MD  traMADol (ULTRAM) 50 MG tablet Take 25 mg by mouth every 8 (eight) hours as needed for moderate pain.    [provider]   Family History Family History  Problem Relation Age of Onset  . Heart disease Mother   . Heart disease Father   . Heart disease Brother        x 3   Social History Social History  Substance Use Topics  . Smoking status: Never Smoker  . Smokeless tobacco: Never Used  . Alcohol use Yes     Comment: occasionally   Allergies   Codeine and Garlic  Review of Systems Review of Systems  Constitutional: Positive for fever.  HENT: Negative for congestion.   Respiratory: Negative for cough.     Cardiovascular: Negative for chest pain.  Gastrointestinal: Negative for constipation, diarrhea, nausea and vomiting.  Genitourinary: Negative for dysuria and frequency.  Musculoskeletal: Positive for arthralgias.  All other systems reviewed and are negative.    Physical Exam Updated Vital Signs SpO2 95%   Physical Exam  Constitutional: She appears well-developed and well-nourished.  HENT:  Head: Normocephalic.  Mouth/Throat: Oropharynx is clear and moist. No oropharyngeal exudate.  Eyes: Pupils are equal, round, and reactive to light. Conjunctivae and EOM are normal. Right eye exhibits no discharge. Left eye exhibits no discharge. No scleral icterus.  Pinpoint pupils.   Neck: Normal range of motion. Neck supple. No JVD present. No tracheal deviation present.  Trachea is midline. No stridor or carotid bruits.   Cardiovascular: Normal rate, regular rhythm, normal heart sounds and  intact distal pulses.   No murmur heard. Pulmonary/Chest: Effort normal and breath sounds normal. No stridor. No respiratory distress. She has no wheezes. She has no rales.  Lungs CTA bilaterally.  Abdominal: Soft. Bowel sounds are normal. She exhibits no distension. There is no tenderness. There is no rebound and no guarding.  Musculoskeletal: Normal range of motion. She exhibits no edema or tenderness.  All compartments are soft. No palpable cords.   Lymphadenopathy:    She has no cervical adenopathy.  Neurological: She is alert. She has normal reflexes. She displays normal reflexes. She exhibits normal muscle tone.  Skin: Skin is warm and dry. Capillary refill takes less than 2 seconds.  Right lateral hip arthoplasty incision is clean dry and intact.   Psychiatric: She has a normal mood and affect. Her behavior is normal.  Nursing note and vitals reviewed.   ED Treatments / Results   Vitals:   05/27/17 0050  BP: 124/64  Pulse: 84  Resp: (!) 24  Temp: (!) 102.3 F (39.1 C)    DIAGNOSTIC  STUDIES: Oxygen Saturation is 95% on RA, adequate by my interpretation.   COORDINATION OF CARE: 1:07 AM-Discussed next steps with pt. Pt verbalized understanding and is agreeable with the plan.   Labs (all labs ordered are listed, but only abnormal results are displayed)  Results for orders placed or performed during the hospital encounter of 05/27/17  Comprehensive metabolic panel  Result Value Ref Range   Sodium 134 (L) 135 - 145 mmol/L   Potassium 3.1 (L) 3.5 - 5.1 mmol/L   Chloride 100 (L) 101 - 111 mmol/L   CO2 24 22 - 32 mmol/L   Glucose, Bld 100 (H) 65 - 99 mg/dL   BUN 28 (H) 6 - 20 mg/dL   Creatinine, Ser 1.07 (H) 0.44 - 1.00 mg/dL   Calcium 8.7 (L) 8.9 - 10.3 mg/dL   Total Protein 6.1 (L) 6.5 - 8.1 g/dL   Albumin 3.1 (L) 3.5 - 5.0 g/dL   AST 18 15 - 41 U/L   ALT 10 (L) 14 - 54 U/L   Alkaline Phosphatase 84 38 - 126 U/L   Total Bilirubin 0.6 0.3 - 1.2 mg/dL   GFR calc non Af Amer 44 (L) >60 mL/min   GFR calc Af Amer 51 (L) >60 mL/min   Anion gap 10 5 - 15  CBC WITH DIFFERENTIAL  Result Value Ref Range   WBC 19.8 (H) 4.0 - 10.5 K/uL   RBC 2.38 (L) 3.87 - 5.11 MIL/uL   Hemoglobin 6.7 (LL) 12.0 - 15.0 g/dL   HCT 20.7 (L) 36.0 - 46.0 %   MCV 87.0 78.0 - 100.0 fL   MCH 28.2 26.0 - 34.0 pg   MCHC 32.4 30.0 - 36.0 g/dL   RDW 14.6 11.5 - 15.5 %   Platelets 417 (H) 150 - 400 K/uL   Neutrophils Relative % 92 %   Neutro Abs 18.3 (H) 1.7 - 7.7 K/uL   Lymphocytes Relative 2 %   Lymphs Abs 0.5 (L) 0.7 - 4.0 K/uL   Monocytes Relative 5 %   Monocytes Absolute 1.0 0.1 - 1.0 K/uL   Eosinophils Relative 0 %   Eosinophils Absolute 0.0 0.0 - 0.7 K/uL   Basophils Relative 0 %   Basophils Absolute 0.0 0.0 - 0.1 K/uL  I-Stat CG4 Lactic Acid, ED  (not at  [facility])  Result Value Ref Range   Lactic Acid, Venous 0.98 0.5 - 1.9 mmol/L  I-stat Chem 8,  ED  Result Value Ref Range   Sodium 134 (L) 135 - 145 mmol/L   Potassium 3.1 (L) 3.5 - 5.1 mmol/L   Chloride 99 (L) 101 - 111 mmol/L     BUN 27 (H) 6 - 20 mg/dL   Creatinine, Ser 1.00 0.44 - 1.00 mg/dL   Glucose, Bld 100 (H) 65 - 99 mg/dL   Calcium, Ion 1.03 (L) 1.15 - 1.40 mmol/L   TCO2 23 0 - 100 mmol/L   Hemoglobin 6.1 (LL) 12.0 - 15.0 g/dL   HCT 18.0 (L) 36.0 - 46.0 %   Comment NOTIFIED PHYSICIAN    Dg Chest 2 View  Result Date: 04/29/2017 CLINICAL DATA:  Onset of shortness of breath and chest tightness this morning associated with 1 episode of nausea and vomiting. The patient has a history of coronary artery disease with stent placement, cardiomyopathy, CHF. EXAM: CHEST  2 VIEW COMPARISON:  Chest x-ray of April 04, 2017 FINDINGS: The lungs are adequately inflated. The interstitial markings are mildly prominent though stable. The cardiac silhouette remains enlarged. The pulmonary vascularity is not engorged. There is calcification in the wall of the thoracic aorta. There are degenerative changes of both shoulders. There is degenerative narrowing at multiple thoracic disc levels. IMPRESSION: Chronic cardiomegaly without pulmonary vascular congestion or acute pulmonary edema. No acute pneumonia. Thoracic aortic atherosclerosis. Electronically Signed   By: David  Martinique M.D.   On: 04/29/2017 12:16   Dg Chest Port 1 View  Result Date: 05/27/2017 CLINICAL DATA:  Fever and left hip pain tonight. EXAM: PORTABLE CHEST 1 VIEW COMPARISON:  04/29/2017 FINDINGS: Shallow inspiration. Cardiac enlargement. Mild interstitial pattern to the lungs may indicate interstitial edema or pneumonitis. Pulmonary vascularity appears normal. No focal consolidation. No blunting of costophrenic angles. No pneumothorax. Calcified and tortuous aorta. Degenerative changes in the shoulders. Calcifications projected in or around the left shoulder may represent bone lesions such as enchondromas or synovial cartilage consistent with osteochondromatosis. IMPRESSION: Cardiac enlargement. Diffuse mild interstitial pattern to the lungs may represent edema or pneumonitis.  Electronically Signed   By: Lucienne Capers M.D.   On: 05/27/2017 01:35    Radiology No results found.  Procedures Procedures (including critical care time)  Medications Ordered in ED  Medications  vancomycin (VANCOCIN) IVPB 1000 mg/200 mL premix (1,000 mg Intravenous New Bag/Given 05/27/17 0201)  piperacillin-tazobactam (ZOSYN) IVPB 3.375 g (0 g Intravenous Stopped 05/27/17 0231)  acetaminophen (TYLENOL) tablet 1,000 mg (1,000 mg Oral Given 05/27/17 0147)     Final Clinical Impressions(s) / ED Diagnoses  SIRS criteria: withheald fluids due to the fact that patient has severe CHF on milrinone drip in addition to the fact they are normotensive and IVF would do harm to this patient.  ABX started immediately.    Admit to medicine   I personally performed the services described in this documentation, which was scribed in my presence. The recorded information has been reviewed and is accurate.      Jhamal Plucinski, MD 05/27/17 0174

## 2017-05-27 NOTE — Progress Notes (Signed)
Progress Note    Jacqueline Reilly  WNI:[id number] DOB: 08/05/1924  DOA: 05/27/2017 PCP: [name], MD    Brief Narrative:   Chief complaint: Follow-up sepsis  Medical records reviewed and are as summarized below:  Jacqueline Reilly is an 83 y.o. female with a PMH of hypertension, hyperlipidemia, atrial fibrillation, chronic systolic CHF with an EF of 25-30 percent on a chronic continuous IV milrinone infusion since 2015, CAD status post balloon angioplasty 2007 and DES 2010, PVD, AAA, RAS who was admitted 05/27/17 for evaluation of fever up to 102F. Of note, 2 months ago she had hip surgery and was doing well with physical therapy. There have not been any reports of incisional pain or signs of infection. Upon initial evaluation in the ED, she was hypotensive with a blood pressure of 86/47, febrile to 102.60F, WBC 19.8, hemoglobin 6.7, lactic acid 0.98. Empirically placed on vancomycin and Zosyn.  Assessment/Plan:   Principal Problem:   SIRS (systemic inflammatory response syndrome) (HCC)/Probable sepsis with suspected bacteremia Patient was febrile, and has known continuous IV access raising the concern for sepsis from bacterial contamination of her IV line. Will need her PICC line removed. She has considerable leukocytosis. Blood cultures sent. Continue broad-spectrum antibiotics for now including vancomycin and Zosyn.  Active Problems:   Chronic systolic CHF EF 38-18 percent. Continue milrinone and amiodarone. Torsemide/Lasix held this morning secondary to low blood pressure. Compensated clinically.    Stage IV chronic kidney disease Avoid nephrotoxins and monitor creatinine.    Atrial fibrillation (HCC) Heart rate is in the 50s. Blood thinners on hold secondary to significant anemia.    Hypokalemia Given oral supplementation.    Acute blood loss anemia Given 1 unit of PRBCs. Fecal occult blood testing was negative, but suspected to have had a diverticular bleed that has  resolved.    Anxiety Continue BuSpar.    Severe protein calorie malnutrition/underweight Body mass index is 19.42 kg/m.   Family Communication/Anticipated D/C date and plan/Code Status   DVT prophylaxis: SCDs ordered. Code Status: Full Code.  Family Communication: Spouse at bedside. Disposition Plan: Home versus SNF depending on progress.   Medical Consultants:    Cardiology   Anti-Infectives:    Vancomycin 05/27/17--->  Zosyn 05/27/17--->   Subjective:   Patient denies dyspnea or pain. Has some short-term memory changes and repeats herself, but otherwise seems calm and comfortable. Poor appetite reported by spouse.  Objective:    Vitals:   05/27/17 0730 05/27/17 0745 05/27/17 0800 05/27/17 0830  BP: (!) 88/47 (!) 90/49 (!) 111/58 113/64  Pulse: (!) 53 (!) 54 (!) 59 (!) 59  Resp: (!) 27 (!) 24 19 20   Temp:      TempSrc:      SpO2: 98% 99% 100% 99%  Weight:      Height:        Intake/Output Summary (Last 24 hours) at 05/27/17 0846 Last data filed at 05/27/17 0836  Gross per 24 hour  Intake              893 ml  Output                0 ml  Net              893 ml   Filed Weights   05/27/17 0050  Weight: 40.7 kg (89 lb 11.6 oz)    Exam: General: Thin elderly female who is in no acute distress. Cardiovascular: Heart sounds show a  regular rate, and rhythm. No gallops or rubs. No murmurs. Mild JVD. Lungs: Clear to auscultation bilaterally with good air movement. No rales, rhonchi or wheezes. Abdomen: Soft, nontender, nondistended with normal active bowel sounds. No masses. No hepatosplenomegaly. Neurological: Alert and oriented 2. Moves all extremities 4 with equal strength. Cranial nerves II through XII grossly intact. Short-term memory deficits. Skin: Warm and dry. No rashes or lesions. Mild tenderness around PICC line insertion site, but no pus or frank signs of infection. Extremities: No clubbing or cyanosis. No edema. Pedal pulses 2+. Psychiatric:  Mood and affect are normal. Insight and judgment are fair.   Data Reviewed:   I have personally reviewed following labs and imaging studies:  Labs: Labs show the following:   Basic Metabolic Panel:  Recent Labs Lab 05/27/17 0128 05/27/17 0135  NA 134* 134*  K 3.1* 3.1*  CL 100* 99*  CO2 24  --   GLUCOSE 100* 100*  BUN 28* 27*  CREATININE 1.07* 1.00  CALCIUM 8.7*  --    GFR Estimated Creatinine Clearance: 22.3 mL/min (by C-G formula based on SCr of 1 mg/dL). Liver Function Tests:  Recent Labs Lab 05/27/17 0128  AST 18  ALT 10*  ALKPHOS 84  BILITOT 0.6  PROT 6.1*  ALBUMIN 3.1*   CBC:  Recent Labs Lab 05/27/17 0128 05/27/17 0135  WBC 19.8*  --   NEUTROABS 18.3*  --   HGB 6.7* 6.1*  HCT 20.7* 18.0*  MCV 87.0  --   PLT 417*  --    Sepsis Labs:  Recent Labs Lab 05/27/17 0128 05/27/17 0136  WBC 19.8*  --   LATICACIDVEN  --  0.98    Microbiology No results found for this or any previous visit (from the past 240 hour(s)).  Procedures and diagnostic studies:  Dg Chest Port 1 View  Result Date: 05/27/2017 CLINICAL DATA:  Fever and left hip pain tonight. EXAM: PORTABLE CHEST 1 VIEW COMPARISON:  04/29/2017 FINDINGS: Shallow inspiration. Cardiac enlargement. Mild interstitial pattern to the lungs may indicate interstitial edema or pneumonitis. Pulmonary vascularity appears normal. No focal consolidation. No blunting of costophrenic angles. No pneumothorax. Calcified and tortuous aorta. Degenerative changes in the shoulders. Calcifications projected in or around the left shoulder may represent bone lesions such as enchondromas or synovial cartilage consistent with osteochondromatosis. IMPRESSION: Cardiac enlargement. Diffuse mild interstitial pattern to the lungs may represent edema or pneumonitis. Electronically Signed   By: Lucienne Capers M.D.   On: 05/27/2017 01:35   Dg Hip Unilat W Or Wo Pelvis 2-3 Views Left  Result Date: 05/27/2017 CLINICAL DATA:   83 year old female with left hip pain and fever. History of hip replacement. EXAM: DG HIP (WITH OR WITHOUT PELVIS) 2-3V LEFT COMPARISON:  Intraoperative radiograph dated 03/05/2017 FINDINGS: There is a left hip hemiarthroplasty. The orthopedic hardware appears intact. No evidence of hardware loosening. There is no acute fracture or dislocation. The bones are osteopenic. There is osteoarthritic changes of the right hip. The soft tissues appear unremarkable. IMPRESSION: 1. No acute fracture or dislocation. 2. Left hip hemiarthroplasty without complication. Electronically Signed   By: Anner Crete M.D.   On: 05/27/2017 03:12    Medications:   . acetaminophen  1,000 mg Oral TID  . amiodarone  100 mg Oral Daily  . furosemide  20 mg Intravenous Once  . lisinopril  2.5 mg Oral Daily  . potassium chloride  40 mEq Oral STAT  . torsemide  40 mg Oral BID   Continuous Infusions: .  sodium chloride Stopped (05/27/17 0636)  . milrinone 0.125 mcg/kg/min (05/27/17 0827)   No charge   LOS: 0 days   Garyson Stelly  [facility] Pager [phone]. If unable to reach me by pager, please call my cell phone at [phone].  *Please refer to [email or web address], password [email or web address] to get updated schedule on who will round on this patient, as hospitalists switch teams weekly. If 7PM-7AM, please contact night-coverage at [email or web address], password [email or web address] for any overnight needs.  05/27/2017, 8:46 AM

## 2017-05-28 DIAGNOSIS — E876 Hypokalemia: Secondary | ICD-10-CM

## 2017-05-28 DIAGNOSIS — N184 Chronic kidney disease, stage 4 (severe): Secondary | ICD-10-CM

## 2017-05-28 DIAGNOSIS — I5023 Acute on chronic systolic (congestive) heart failure: Secondary | ICD-10-CM

## 2017-05-28 LAB — TYPE AND SCREEN
ABO/RH(D): O NEG
Antibody Screen: NEGATIVE
Unit division: 0

## 2017-05-28 LAB — BASIC METABOLIC PANEL
Anion gap: 9 (ref 5–15)
BUN: 21 mg/dL — ABNORMAL HIGH (ref 6–20)
CO2: 25 mmol/L (ref 22–32)
Calcium: 8.3 mg/dL — ABNORMAL LOW (ref 8.9–10.3)
Chloride: 104 mmol/L (ref 101–111)
Creatinine, Ser: 0.96 mg/dL (ref 0.44–1.00)
GFR calc Af Amer: 58 mL/min — ABNORMAL LOW (ref >60)
GFR calc non Af Amer: 50 mL/min — ABNORMAL LOW (ref >60)
Glucose, Bld: 89 mg/dL (ref 65–99)
Potassium: 3.1 mmol/L — ABNORMAL LOW (ref 3.5–5.1)
Sodium: 138 mmol/L (ref 135–145)

## 2017-05-28 LAB — CBC
HCT: 35.5 % — ABNORMAL LOW (ref 36.0–46.0)
Hemoglobin: 11.3 g/dL — ABNORMAL LOW (ref 12.0–15.0)
MCH: 27.2 pg (ref 26.0–34.0)
MCHC: 31.8 g/dL (ref 30.0–36.0)
MCV: 85.3 fL (ref 78.0–100.0)
Platelets: 314 10*3/uL (ref 150–400)
RBC: 4.16 MIL/uL (ref 3.87–5.11)
RDW: 14.4 % (ref 11.5–15.5)
WBC: 10.8 10*3/uL — ABNORMAL HIGH (ref 4.0–10.5)

## 2017-05-28 LAB — BPAM RBC
Blood Product Expiration Date: 201808022359
ISSUE DATE / TIME: 201807310502
Unit Type and Rh: 9500

## 2017-05-28 MED ORDER — POTASSIUM CHLORIDE CRYS ER 20 MEQ PO TBCR
40.0000 meq | EXTENDED_RELEASE_TABLET | Freq: Two times a day (BID) | ORAL | Status: AC
  Administered 2017-05-28 (×2): 40 meq via ORAL
  Filled 2017-05-28 (×2): qty 2

## 2017-05-28 NOTE — Care Management Note (Signed)
Case Management Note  Patient Details  Name: Jacqueline Reilly MRN: [id number] Date of Birth: 04-17-1924  Subjective/Objective:   Pt admitted with SIRS                   Action/Plan:   PTA from home with husband. CM was unable to engage in meaningful conversation with pt due to constant interruptions from husband at bedside, pt agreed for CM to contact son.  Both pt and husband adamatly denied pt being on IV milrinone in the home - however pt is.  Son states pt will have 24/7 caregiver/supervision at discharge.  Son also relayed safety concerns with father in the home - CSW consulted   Expected Discharge Date:                  Expected Discharge Plan:     In-House Referral:  Clinical Social Work  Discharge planning Services  CM Consult  Post Acute Care Choice:    Choice offered to:     DME Arranged:    DME Agency:     HH Arranged:    [place] Agency:     Status of Service:  In process, will continue to follow  If discussed at Long Length of Stay Meetings, dates discussed:    Additional Comments:  Jacqueline Labrador, RN 05/28/2017, 4:35 PM

## 2017-05-28 NOTE — Progress Notes (Signed)
PROGRESS NOTE    Jacqueline Reilly  WCH:[id number] DOB: 11/05/1923 DOA: 05/27/2017 PCP: [name], MD    Brief Narrative:  83 y.o. female with a PMH of hypertension, hyperlipidemia, atrial fibrillation, chronic systolic CHF with an EF of 25-30 percent on a chronic continuous IV milrinone infusion since 2015, CAD status post balloon angioplasty 2007 and DES 2010, PVD, AAA, RAS who was admitted 05/27/17 for evaluation of fever up to 102F. Of note, 2 months ago she had hip surgery and was doing well with physical therapy. There have not been any reports of incisional pain or signs of infection. Upon initial evaluation in the ED, she was hypotensive with a blood pressure of 86/47, febrile to 102.78F, WBC 19.8, hemoglobin 6.7, lactic acid 0.98. Empirically placed on vancomycin and Zosyn.  Assessment & Plan:   Principal Problem:   SIRS (systemic inflammatory response syndrome) (HCC) Active Problems:   Atrial fibrillation (HCC)   Acute on chronic systolic CHF (congestive heart failure) (HCC)   Hypokalemia   Acute blood loss anemia   Anxiety   CKD (chronic kidney disease), stage IV (HCC)  Principal Problem:   SIRS (systemic inflammatory response syndrome) (HCC)/Probable sepsis with suspected bacteremia -Patient initially febrile, and has known continuous IV access raising the concern for sepsis from bacterial contamination of her IV line.  -PICC removed. -Blood cultures pos for enterobacter and ecoli species. -Patient continued on broad-spectrum antibiotics for now including vancomycin and Zosyn. - Plan to repeat blood cultures in 24-48hrs to ensure eradication of bacteremia before placing new line  Active Problems:   Chronic systolic CHF - EF 35-36 percent. Patient continued on milrinone and amiodarone.  - Appears compensated at this time - Cardiology following    Stage IV chronic kidney disease - Continue to avoid nephrotoxins and monitor creatinine. - Stable at present   Atrial fibrillation (HCC) - Heart rate is in the 50s. Blood thinners on hold secondary to significant anemia. - Currently rate ctonrolled    Hypokalemia - Given oral supplementation. -Low today. Will replace - Repeat bmet in AM    Acute blood loss anemia - Given 1 unit of PRBCs. Fecal occult blood testing was negative, but suspected to have had a diverticular bleed that has resolved. - Hgb has remained stable overnight - Repeat cbc in AM    Anxiety - Continue BuSpar. - Currently stable    Severe protein calorie malnutrition/underweight - Body mass index is 19.42 kg/m. - Currently stable  DVT prophylaxis: SCD's Code Status: Full Family Communication: Pt in room, son at bedside Disposition Plan: Uncertain at this time  Consultants:   Cardiology  Procedures:     Antimicrobials: Anti-infectives    Start     Dose/Rate Route Frequency Ordered Stop   05/28/17 0200  vancomycin (VANCOCIN) 500 mg in sodium chloride 0.9 % 100 mL IVPB  Status:  Discontinued     500 mg 100 mL/hr over 60 Minutes Intravenous Every 24 hours 05/27/17 1109 05/27/17 1948   05/27/17 2100  cefTRIAXone (ROCEPHIN) 2 g in dextrose 5 % 50 mL IVPB     2 g 100 mL/hr over 30 Minutes Intravenous Every 24 hours 05/27/17 1948     05/27/17 1200  piperacillin-tazobactam (ZOSYN) IVPB 2.25 g  Status:  Discontinued     2.25 g 100 mL/hr over 30 Minutes Intravenous Every 6 hours 05/27/17 1109 05/27/17 1948   05/27/17 0100  piperacillin-tazobactam (ZOSYN) IVPB 3.375 g     3.375 g 100 mL/hr over 30 Minutes Intravenous  Once 05/27/17 0052 05/27/17 0231   05/27/17 0100  vancomycin (VANCOCIN) IVPB 1000 mg/200 mL premix     1,000 mg 200 mL/hr over 60 Minutes Intravenous  Once 05/27/17 0052 05/27/17 0301       Subjective: Confused. Wanting to go home  Objective: Vitals:   05/28/17 0245 05/28/17 0500 05/28/17 0739 05/28/17 1121  BP: 127/65  122/72 99/77  Pulse: 61  61 62  Resp: (!) 23  19 (!) 23  Temp: 98.1  F (36.7 C)  97.7 F (36.5 C) 97.6 F (36.4 C)  TempSrc: Oral  Oral Oral  SpO2: 96%  98% 98%  Weight:  39.4 kg (86 lb 13.8 oz)    Height:        Intake/Output Summary (Last 24 hours) at 05/28/17 1350 Last data filed at 05/28/17 0500  Gross per 24 hour  Intake           202.88 ml  Output              650 ml  Net          -447.12 ml   Filed Weights   05/27/17 0050 05/27/17 1721 05/28/17 0500  Weight: 40.7 kg (89 lb 11.6 oz) 40.7 kg (89 lb 11.6 oz) 39.4 kg (86 lb 13.8 oz)    Examination:  General exam: Appears calm and comfortable  Respiratory system: Clear to auscultation. Respiratory effort normal. Cardiovascular system: S1 & S2 heard, RRR Gastrointestinal system: Abdomen is nondistended, soft and nontender. No organomegaly or masses felt. Normal bowel sounds heard. Central nervous system: Alert and oriented. No focal neurological deficits. Extremities: Symmetric 5 x 5 power. Skin: No rashes, lesions  Psychiatry: Judgement and insight appear normal. Mood & affect appropriate.   Data Reviewed: I have personally reviewed following labs and imaging studies  CBC:  Recent Labs Lab 05/27/17 0128 05/27/17 0135 05/27/17 1036 05/28/17 0846  WBC 19.8*  --  15.0* 10.8*  NEUTROABS 18.3*  --   --   --   HGB 6.7* 6.1* 10.6* 11.3*  HCT 20.7* 18.0* 32.3* 35.5*  MCV 87.0  --  85.0 85.3  PLT 417*  --  306 518   Basic Metabolic Panel:  Recent Labs Lab 05/27/17 0128 05/27/17 0135 05/27/17 0453 05/28/17 0331  NA 134* 134* 137 138  K 3.1* 3.1* 3.4* 3.1*  CL 100* 99* 102 104  CO2 24  --  26 25  GLUCOSE 100* 100* 92 89  BUN 28* 27* 26* 21*  CREATININE 1.07* 1.00 1.11* 0.96  CALCIUM 8.7*  --  8.5* 8.3*   GFR: Estimated Creatinine Clearance: 23.3 mL/min (by C-G formula based on SCr of 0.96 mg/dL). Liver Function Tests:  Recent Labs Lab 05/27/17 0128  AST 18  ALT 10*  ALKPHOS 84  BILITOT 0.6  PROT 6.1*  ALBUMIN 3.1*   No results for input(s): LIPASE, AMYLASE in  the last 168 hours. No results for input(s): AMMONIA in the last 168 hours. Coagulation Profile: No results for input(s): INR, PROTIME in the last 168 hours. Cardiac Enzymes: No results for input(s): CKTOTAL, CKMB, CKMBINDEX, TROPONINI in the last 168 hours. BNP (last 3 results) No results for input(s): PROBNP in the last 8760 hours. HbA1C: No results for input(s): HGBA1C in the last 72 hours. CBG: No results for input(s): GLUCAP in the last 168 hours. Lipid Profile: No results for input(s): CHOL, HDL, LDLCALC, TRIG, CHOLHDL, LDLDIRECT in the last 72 hours. Thyroid Function Tests: No results for input(s): TSH, T4TOTAL,  FREET4, T3FREE, THYROIDAB in the last 72 hours. Anemia Panel: No results for input(s): VITAMINB12, FOLATE, FERRITIN, TIBC, IRON, RETICCTPCT in the last 72 hours. Sepsis Labs:  Recent Labs Lab 05/27/17 0136 05/27/17 0453 05/27/17 1038  PROCALCITON  --  40.60  --   LATICACIDVEN 0.98  --  0.8    Recent Results (from the past 240 hour(s))  Blood Culture (routine x 2)     Status: None (Preliminary result)   Collection Time: 05/27/17  1:26 AM  Result Value Ref Range Status   Specimen Description BLOOD RIGHT HAND  Final   Special Requests   Final    BOTTLES DRAWN AEROBIC AND ANAEROBIC Blood Culture adequate volume   Culture  Setup Time   Final    GRAM NEGATIVE RODS ANAEROBIC BOTTLE ONLY IDENTIFICATION TO FOLLOW CRITICAL RESULT CALLED TO, READ BACK BY AND VERIFIED WITH: Jerene Bears PHARMD 1934 05/27/17 A BROWNING    Culture GRAM NEGATIVE RODS  Final   Report Status PENDING  Incomplete  Blood Culture ID Panel (Reflexed)     Status: Abnormal   Collection Time: 05/27/17  1:26 AM  Result Value Ref Range Status   Enterococcus species NOT DETECTED NOT DETECTED Final   Listeria monocytogenes NOT DETECTED NOT DETECTED Final   Staphylococcus species NOT DETECTED NOT DETECTED Final   Staphylococcus aureus NOT DETECTED NOT DETECTED Final   Streptococcus species NOT DETECTED  NOT DETECTED Final   Streptococcus agalactiae NOT DETECTED NOT DETECTED Final   Streptococcus pneumoniae NOT DETECTED NOT DETECTED Final   Streptococcus pyogenes NOT DETECTED NOT DETECTED Final   Acinetobacter baumannii NOT DETECTED NOT DETECTED Final   Enterobacteriaceae species DETECTED (A) NOT DETECTED Final    Comment: Enterobacteriaceae represent a large family of gram-negative bacteria, not a single organism. CRITICAL RESULT CALLED TO, READ BACK BY AND VERIFIED WITH: Jerene Bears PHARMD 1934 05/27/17 A BROWNING    Enterobacter cloacae complex NOT DETECTED NOT DETECTED Final   Escherichia coli DETECTED (A) NOT DETECTED Final    Comment: CRITICAL RESULT CALLED TO, READ BACK BY AND VERIFIED WITH: Jerene Bears PHARMD 1934 05/27/17 A BROWNING    Klebsiella oxytoca NOT DETECTED NOT DETECTED Final   Klebsiella pneumoniae NOT DETECTED NOT DETECTED Final   Proteus species NOT DETECTED NOT DETECTED Final   Serratia marcescens NOT DETECTED NOT DETECTED Final   Carbapenem resistance NOT DETECTED NOT DETECTED Final   Haemophilus influenzae NOT DETECTED NOT DETECTED Final   Neisseria meningitidis NOT DETECTED NOT DETECTED Final   Pseudomonas aeruginosa NOT DETECTED NOT DETECTED Final   Candida albicans NOT DETECTED NOT DETECTED Final   Candida glabrata NOT DETECTED NOT DETECTED Final   Candida krusei NOT DETECTED NOT DETECTED Final   Candida parapsilosis NOT DETECTED NOT DETECTED Final   Candida tropicalis NOT DETECTED NOT DETECTED Final  MRSA PCR Screening     Status: None   Collection Time: 05/27/17  7:50 PM  Result Value Ref Range Status   MRSA by PCR NEGATIVE NEGATIVE Final    Comment:        The GeneXpert MRSA Assay (FDA approved for NASAL specimens only), is one component of a comprehensive MRSA colonization surveillance program. It is not intended to diagnose MRSA infection nor to guide or monitor treatment for MRSA infections.      Radiology Studies: Dg Chest Port 1 View  Result  Date: 05/27/2017 CLINICAL DATA:  Fever and left hip pain tonight. EXAM: PORTABLE CHEST 1 VIEW COMPARISON:  04/29/2017 FINDINGS: Shallow inspiration. Cardiac  enlargement. Mild interstitial pattern to the lungs may indicate interstitial edema or pneumonitis. Pulmonary vascularity appears normal. No focal consolidation. No blunting of costophrenic angles. No pneumothorax. Calcified and tortuous aorta. Degenerative changes in the shoulders. Calcifications projected in or around the left shoulder may represent bone lesions such as enchondromas or synovial cartilage consistent with osteochondromatosis. IMPRESSION: Cardiac enlargement. Diffuse mild interstitial pattern to the lungs may represent edema or pneumonitis. Electronically Signed   By: Lucienne Capers M.D.   On: 05/27/2017 01:35   Dg Hip Unilat W Or Wo Pelvis 2-3 Views Left  Result Date: 05/27/2017 CLINICAL DATA:  83 year old female with left hip pain and fever. History of hip replacement. EXAM: DG HIP (WITH OR WITHOUT PELVIS) 2-3V LEFT COMPARISON:  Intraoperative radiograph dated 03/05/2017 FINDINGS: There is a left hip hemiarthroplasty. The orthopedic hardware appears intact. No evidence of hardware loosening. There is no acute fracture or dislocation. The bones are osteopenic. There is osteoarthritic changes of the right hip. The soft tissues appear unremarkable. IMPRESSION: 1. No acute fracture or dislocation. 2. Left hip hemiarthroplasty without complication. Electronically Signed   By: Anner Crete M.D.   On: 05/27/2017 03:12    Scheduled Meds: . acetaminophen  1,000 mg Oral TID  . amiodarone  100 mg Oral Daily  . furosemide  20 mg Intravenous Once  . potassium chloride  40 mEq Oral BID  . torsemide  40 mg Oral BID   Continuous Infusions: . sodium chloride Stopped (05/27/17 0636)  . cefTRIAXone (ROCEPHIN)  IV Stopped (05/27/17 2102)  . milrinone 0.125 mcg/kg/min (05/27/17 0827)     LOS: 1 day   Zianne Schubring, Orpah Melter, MD [facility] Pager [phone]  If 7PM-7AM, please contact night-coverage [email or web address] Password [email or web address] 05/28/2017, 1:50 PM

## 2017-05-28 NOTE — Progress Notes (Signed)
Advanced Heart Failure Rounding Note  PCP: Dr Laurann Montana Primary Cardiologist: Dr Haroldine Laws   Subjective:    Admitted with fever- SIRS Blood CX 1/2 E Coli. Antibiotics narrowed to rocephin.  Picc removed. A febrile over night.   Continues on milrinone 0.125 mcg.   Remains confused. Denies CP or SOB. No dysuria. UA + Cultures NGTD   Objective:   Weight Range: 86 lb 13.8 oz (39.4 kg) Body mass index is 18.8 kg/m.   Vital Signs:   Temp:  [97.7 F (36.5 C)-98.3 F (36.8 C)] 97.7 F (36.5 C) (08/01 0739) Pulse Rate:  [53-61] 61 (08/01 0739) Resp:  [14-29] 19 (08/01 0739) BP: (91-130)/(46-72) 122/72 (08/01 0739) SpO2:  [92 %-100 %] 98 % (08/01 0739) Weight:  [86 lb 13.8 oz (39.4 kg)-89 lb 11.6 oz (40.7 kg)] 86 lb 13.8 oz (39.4 kg) (08/01 0500) Last BM Date:  (PTA)  Weight change: Filed Weights   05/27/17 0050 05/27/17 1721 05/28/17 0500  Weight: 89 lb 11.6 oz (40.7 kg) 89 lb 11.6 oz (40.7 kg) 86 lb 13.8 oz (39.4 kg)    Intake/Output:   Intake/Output Summary (Last 24 hours) at 05/28/17 7517 Last data filed at 05/28/17 0500  Gross per 24 hour  Intake           530.88 ml  Output              650 ml  Net          -119.12 ml      Physical Exam    General:  Elderly confused lying in bed NAD HEENT: normal Neck: supple. JVP 8-9 Carotids 2+ bilat; no bruits. No lymphadenopathy or thryomegaly appreciated. Cor: PMI nondisplaced. Regular rate & rhythm. 2/6 SEM RUSB Lungs: clear Abdomen: soft, nontender, nondistended. No hepatosplenomegaly. No bruits or masses. Good bowel sounds. Extremities: no cyanosis, clubbing, rash, edema Neuro: confused cranial nerves grossly intact. moves all 4 extremities w/o difficulty. Affect pleasant   Telemetry   NSR 60s  Personally reviewed.   EKG    NSR 832 bpm LBBB with PACs  Labs    CBC  Recent Labs  05/27/17 0128 05/27/17 0135 05/27/17 1036  WBC 19.8*  --  15.0*  NEUTROABS 18.3*  --   --   HGB 6.7* 6.1* 10.6*  HCT  20.7* 18.0* 32.3*  MCV 87.0  --  85.0  PLT 417*  --  001   Basic Metabolic Panel  Recent Labs  05/27/17 0453 05/28/17 0331  NA 137 138  K 3.4* 3.1*  CL 102 104  CO2 26 25  GLUCOSE 92 89  BUN 26* 21*  CREATININE 1.11* 0.96  CALCIUM 8.5* 8.3*   Liver Function Tests  Recent Labs  05/27/17 0128  AST 18  ALT 10*  ALKPHOS 84  BILITOT 0.6  PROT 6.1*  ALBUMIN 3.1*   No results for input(s): LIPASE, AMYLASE in the last 72 hours. Cardiac Enzymes No results for input(s): CKTOTAL, CKMB, CKMBINDEX, TROPONINI in the last 72 hours.  BNP: BNP (last 3 results)  Recent Labs  03/04/17 1235 04/04/17 2331 05/27/17 0324  BNP 69.6 412.3* 384.0*    ProBNP (last 3 results) No results for input(s): PROBNP in the last 8760 hours.   D-Dimer No results for input(s): DDIMER in the last 72 hours. Hemoglobin A1C No results for input(s): HGBA1C in the last 72 hours. Fasting Lipid Panel No results for input(s): CHOL, HDL, LDLCALC, TRIG, CHOLHDL, LDLDIRECT in the last 72 hours. Thyroid Function  Tests No results for input(s): TSH, T4TOTAL, T3FREE, THYROIDAB in the last 72 hours.  Invalid input(s): FREET3  Other results:   Imaging     No results found.   Medications:     Scheduled Medications: . acetaminophen  1,000 mg Oral TID  . amiodarone  100 mg Oral Daily  . furosemide  20 mg Intravenous Once  . potassium chloride  40 mEq Oral BID  . torsemide  40 mg Oral BID     Infusions: . sodium chloride Stopped (05/27/17 0636)  . cefTRIAXone (ROCEPHIN)  IV Stopped (05/27/17 2102)  . milrinone 0.125 mcg/kg/min (05/27/17 0827)     PRN Medications:  acetaminophen **OR** acetaminophen, albuterol, busPIRone, ondansetron **OR** ondansetron (ZOFRAN) IV    Patient Profile  Jacqueline Reilly is a 83 year old with a history of ICM, dementia, end stage systolic heart failure on milrinone 0.125 mcg followed closely in the HF clinic admitted with fever.    Assessment/Plan   1.  SIRS Probable Sepsis with suspected Bactermia-  Infected PICC certainly a possibility and may be related to her son disconnecting milrinone.  PICC removed. Will need to replace once infection controlled.   Blood CX- GNR 1/2 E-Coli.Antibiotics narrowed to rocephin.   2. Anemia- hgb on admit 6.7 received 1UPRBCs with appropriate rise. CBC pending.  3. End Stage Systolic Heart Failure- on home milrinone 0.125 mcg. See above remove PICC can run milrinone through PIV.  Volume status ok. Hold diuretics for now. No bb with low output.  Also MR which is new so plan to repeat ECHO to further evaluate.  4. PAF- rate slow. Continue low dose amio. Hold apixaban with anemia noted on admit.  5. Dementia 6. Hypokalemia K 3.1 . Supp K  7. UTI    At some point will need Palliative Care for Goals of Care. Needs Hospice however I doubt the family will agree.     Length of Stay: [place], NP  05/28/2017, 8:32 AM  Advanced Heart Failure Team Pager [phone] (M-F; 7a - 4p)  Please contact [facility] for night-coverage after hours (4p -7a ) and weekends on [email or web address]  Patient seen and examined with Darrick Grinder, NP. We discussed all aspects of the encounter. I agree with the assessment and plan as stated above.   Remains afebrile WBC coming down. Unclear if fever source was UTI or if she has has blood stream infection from PICC (PICC site was tender). PICC now removed. Await cultures. If cx negative x 48 hours can replace PICC and d/c home with milrinone. If has bacteremia will need to re-evaluate risks/benefits of ongoing milrinone therapy with her son (I began this conversation again today). Await echo. Supp K+. Holding apixaban with anemia. If hgb stable tomorrow can restart.   Glori Bickers, MD  1:53 PM

## 2017-05-28 NOTE — Progress Notes (Signed)
RN pulled aside by patient's husband. Husband expressed concerns about son. Husband stated, "he's been seen by a psychiatrist since he was a little boy. We need help before someone get's killed." Social work has already been consulted about safety of patient.   Basil Dess, RN

## 2017-05-29 ENCOUNTER — Inpatient Hospital Stay (HOSPITAL_COMMUNITY): Payer: Medicare Other

## 2017-05-29 DIAGNOSIS — I350 Nonrheumatic aortic (valve) stenosis: Secondary | ICD-10-CM

## 2017-05-29 DIAGNOSIS — I482 Chronic atrial fibrillation: Secondary | ICD-10-CM

## 2017-05-29 DIAGNOSIS — F419 Anxiety disorder, unspecified: Secondary | ICD-10-CM

## 2017-05-29 LAB — BASIC METABOLIC PANEL
Anion gap: 8 (ref 5–15)
BUN: 18 mg/dL (ref 6–20)
CO2: 25 mmol/L (ref 22–32)
Calcium: 8.6 mg/dL — ABNORMAL LOW (ref 8.9–10.3)
Chloride: 107 mmol/L (ref 101–111)
Creatinine, Ser: 0.87 mg/dL (ref 0.44–1.00)
GFR calc Af Amer: >60 mL/min (ref >60)
GFR calc non Af Amer: 57 mL/min — ABNORMAL LOW (ref >60)
Glucose, Bld: 82 mg/dL (ref 65–99)
Potassium: 3.8 mmol/L (ref 3.5–5.1)
Sodium: 140 mmol/L (ref 135–145)

## 2017-05-29 LAB — ECHOCARDIOGRAM COMPLETE
Height: 57 in
Weight: 1347.45 oz

## 2017-05-29 LAB — CBC
HCT: 38.2 % (ref 36.0–46.0)
Hemoglobin: 12.2 g/dL (ref 12.0–15.0)
MCH: 27.8 pg (ref 26.0–34.0)
MCHC: 31.9 g/dL (ref 30.0–36.0)
MCV: 87 fL (ref 78.0–100.0)
Platelets: 370 10*3/uL (ref 150–400)
RBC: 4.39 MIL/uL (ref 3.87–5.11)
RDW: 14.7 % (ref 11.5–15.5)
WBC: 11.6 10*3/uL — ABNORMAL HIGH (ref 4.0–10.5)

## 2017-05-29 LAB — CULTURE, BLOOD (ROUTINE X 2): Special Requests: ADEQUATE

## 2017-05-29 MED ORDER — AMPICILLIN SODIUM 1 G IJ SOLR
1.0000 g | Freq: Four times a day (QID) | INTRAMUSCULAR | Status: DC
  Administered 2017-05-29: 1 g via INTRAVENOUS
  Filled 2017-05-29 (×3): qty 1000

## 2017-05-29 MED ORDER — POTASSIUM CHLORIDE CRYS ER 20 MEQ PO TBCR
40.0000 meq | EXTENDED_RELEASE_TABLET | Freq: Once | ORAL | Status: AC
  Administered 2017-05-29: 40 meq via ORAL
  Filled 2017-05-29: qty 2

## 2017-05-29 MED ORDER — APIXABAN 2.5 MG PO TABS
2.5000 mg | ORAL_TABLET | Freq: Two times a day (BID) | ORAL | Status: DC
  Administered 2017-05-29 – 2017-06-02 (×9): 2.5 mg via ORAL
  Filled 2017-05-29 (×9): qty 1

## 2017-05-29 MED ORDER — SODIUM CHLORIDE 0.9 % IV SOLN
1.0000 g | Freq: Three times a day (TID) | INTRAVENOUS | Status: DC
  Administered 2017-05-30 – 2017-06-02 (×11): 1 g via INTRAVENOUS
  Filled 2017-05-29 (×13): qty 1000

## 2017-05-29 MED ORDER — ENSURE ENLIVE PO LIQD
237.0000 mL | Freq: Three times a day (TID) | ORAL | Status: DC
  Administered 2017-05-29 – 2017-06-02 (×4): 237 mL via ORAL

## 2017-05-29 NOTE — Progress Notes (Signed)
PHARMACY NOTE:  ANTIMICROBIAL RENAL DOSAGE ADJUSTMENT  Current antimicrobial regimen includes a mismatch between antimicrobial dosage and estimated renal function.  As per policy approved by the Pharmacy & Therapeutics and Medical Executive Committees, the antimicrobial dosage will be adjusted accordingly.  Current antimicrobial dosage:  Ampicillin 1g IV q6  Indication: Ecoli bacteremia  Renal Function:   Estimated Creatinine Clearance: 25.4 mL/min (by C-G formula based on SCr of 0.87 mg/dL). []      On intermittent HD, scheduled: []      On CRRT    Antimicrobial dosage has been changed to:  Ampicillin 1g IV q8  Additional comments: none   Thank you for allowing pharmacy to be a part of this patient's care.  Deboraha Sprang, [facility] 05/29/2017 8:44 PM
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
## 2017-05-29 NOTE — Progress Notes (Signed)
Spoke with pt's son, he stated he was leaving to have surgery today. He states no one is available to come and take father home or sit with him. Pt's spouse is very upset that a recliner was removed from pt's room, pt had 2, by PT for another pt. Pt and spouse yelling at each other and demanding to have another chair placed in their room.  AC, Ron, updated on pt and spouse situation.

## 2017-05-29 NOTE — Progress Notes (Signed)
Pt's son at bedside, he states he just had an ICD placed. He is willing to take pt's spouse home but he refuses to leave his wife. He states he was staph positive in his nares and was given bactroban and is afraid to give it to his mother. Sister in law is at bedside with pt and her spouse and son went home and states he will bring pt's spouse some clothes in the am. Pt's spouse is incontinent and had to be bathed by staff and paper scrubs were provided due to wet clothes.

## 2017-05-29 NOTE — Progress Notes (Signed)
Advanced Heart Failure Rounding Note  PCP: Dr Laurann [place] Primary Cardiologist: Dr Haroldine Laws   Subjective:    Admitted with fever- SIRS Blood CX 1/2 E Coli. Antibiotics narrowed to rocephin.  Picc removed. A febrile over night.   Continues on milrinone 0.125 mcg.   Confused. Husband in the room demanding to be covered up. Denies SOB   Objective:   Weight Range: 84 lb 3.5 oz (38.2 kg) Body mass index is 18.22 kg/m.   Vital Signs:   Temp:  [97.6 F (36.4 C)-98.7 F (37.1 C)] 98.2 F (36.8 C) (08/02 0745) Pulse Rate:  [62-74] 74 (08/02 0745) Resp:  [18-27] 27 (08/02 0745) BP: (99-147)/(74-86) 146/74 (08/02 0745) SpO2:  [97 %-100 %] 97 % (08/02 0745) Weight:  [84 lb 3.5 oz (38.2 kg)] 84 lb 3.5 oz (38.2 kg) (08/02 0455) Last BM Date:  (PTA)  Weight change: Filed Weights   05/27/17 1721 05/28/17 0500 05/29/17 0455  Weight: 89 lb 11.6 oz (40.7 kg) 86 lb 13.8 oz (39.4 kg) 84 lb 3.5 oz (38.2 kg)    Intake/Output:   Intake/Output Summary (Last 24 hours) at 05/29/17 0819 Last data filed at 05/29/17 0500  Gross per 24 hour  Intake           568.39 ml  Output                0 ml  Net           568.39 ml      Physical Exam    General:  Elderly Frail. No resp difficulty HEENT: normal Neck: supple. no JVD. Carotids 2+ bilat; no bruits. No lymphadenopathy or thryomegaly appreciated. Cor: PMI nondisplaced. Regular rate & rhythm. No rubs, gallops or murmurs. Lungs: clear Abdomen: soft, nontender, nondistended. No hepatosplenomegaly. No bruits or masses. Good bowel sounds. Extremities: no cyanosis, clubbing, rash, edema Neuro: Confused  Telemetry   NSR 60s  Personally reviewed.   EKG    NSR 832 bpm LBBB with PACs  Labs    CBC  Recent Labs  05/27/17 0128  05/28/17 0846 05/29/17 0509  WBC 19.8*  < > 10.8* 11.6*  NEUTROABS 18.3*  --   --   --   HGB 6.7*  < > 11.3* 12.2  HCT 20.7*  < > 35.5* 38.2  MCV 87.0  < > 85.3 87.0  PLT 417*  < > 314 370  < > = values  in this interval not displayed. Basic Metabolic Panel  Recent Labs  05/28/17 0331 05/29/17 0509  NA 138 140  K 3.1* 3.8  CL 104 107  CO2 25 25  GLUCOSE 89 82  BUN 21* 18  CREATININE 0.96 0.87  CALCIUM 8.3* 8.6*   Liver Function Tests  Recent Labs  05/27/17 0128  AST 18  ALT 10*  ALKPHOS 84  BILITOT 0.6  PROT 6.1*  ALBUMIN 3.1*   No results for input(s): LIPASE, AMYLASE in the last 72 hours. Cardiac Enzymes No results for input(s): CKTOTAL, CKMB, CKMBINDEX, TROPONINI in the last 72 hours.  BNP: BNP (last 3 results)  Recent Labs  03/04/17 1235 04/04/17 2331 05/27/17 0324  BNP 69.6 412.3* 384.0*    ProBNP (last 3 results) No results for input(s): PROBNP in the last 8760 hours.   D-Dimer No results for input(s): DDIMER in the last 72 hours. Hemoglobin A1C No results for input(s): HGBA1C in the last 72 hours. Fasting Lipid Panel No results for input(s): CHOL, HDL, LDLCALC, TRIG, CHOLHDL, LDLDIRECT  in the last 72 hours. Thyroid Function Tests No results for input(s): TSH, T4TOTAL, T3FREE, THYROIDAB in the last 72 hours.  Invalid input(s): FREET3  Other results:   Imaging    No results found.   Medications:     Scheduled Medications: . acetaminophen  1,000 mg Oral TID  . amiodarone  100 mg Oral Daily  . furosemide  20 mg Intravenous Once  . torsemide  40 mg Oral BID    Infusions: . sodium chloride Stopped (05/27/17 0636)  . cefTRIAXone (ROCEPHIN)  IV Stopped (05/28/17 2031)  . milrinone 0.125 mcg/kg/min (05/28/17 2015)    PRN Medications: acetaminophen **OR** acetaminophen, albuterol, busPIRone, ondansetron **OR** ondansetron (ZOFRAN) IV    Patient Profile  Jacqueline Reilly is a 83 year old with a history of ICM, dementia, end stage systolic heart failure on milrinone 0.125 mcg followed closely in the HF clinic admitted with fever.    Assessment/Plan   1. SIRS Probable Sepsis with suspected Bactermia-  Infected PICC certainly a  possibility and may be related to her son disconnecting milrinone. WBC up from 10.8> 11.6  PICC removed. Will need to replace once infection controlled.   Blood CX- GNR 1/2 E-Coli.Antibiotics narrowed to rocephin.   2. Anemia- hgb on admit 6.7 received 1UPRBCs with appropriate rise. Hgb 12.2  3. End Stage Systolic Heart Failure- on home milrinone 0.125 mcg. See above remove PICC can run milrinone through PIV.  Volume status stable. Hold diuretics. No bb with low output.  Also MR which is new so plan to repeat ECHO to further evaluate.  4. PAF- rate slow. Continue low dose amio. Hgb stable. Off apixaban with anemia noted on admit.  This patients CHA2DS2-VASc Score is 6 5. Dementia 6. Hypokalemia- resolved.  7. UTI-on rocephin    At some point will need Palliative Care for Goals of Care. Needs Hospice however I doubt the family will agree.    Length of Stay: 2   Darrick Grinder, NP  05/29/2017, 8:19 AM  Advanced Heart Failure Team Pager [phone] (M-F; 7a - 4p)  Please contact [facility] for night-coverage after hours (4p -7a ) and weekends on [email or web address]  Patient seen and examined with Darrick Grinder, NP. We discussed all aspects of the encounter. I agree with the assessment and plan as stated above.   Stable from HF perspective on milrinone. Weight continues to fall without diuretics. Hgb improving. Would restart apixaban 2.5 bid.  Bcx 1/2 with e.coli. Suspect UTI is source and not PICC. Can likely replace single lumen PICC tomorrow and send home or to SNF if no evidence of active infection.   We will follow at a distance.   Glori Bickers, MD  8:48 AM

## 2017-05-29 NOTE — Progress Notes (Signed)
  Echocardiogram 2D Echocardiogram has been performed.  Jacqueline Reilly 05/29/2017, 12:45 PM

## 2017-05-29 NOTE — Progress Notes (Signed)
PROGRESS NOTE    Jacqueline Reilly  ZWC:[id number] DOB: [birth year] DOA: 05/27/2017 PCP: [name], MD    Brief Narrative:  83 y.o. female with a PMH of hypertension, hyperlipidemia, atrial fibrillation, chronic systolic CHF with an EF of 25-30 percent on a chronic continuous IV milrinone infusion since 2015, CAD status post balloon angioplasty 2007 and DES 2010, PVD, AAA, RAS who was admitted 05/27/17 for evaluation of fever up to 102F. Of note, 2 months ago she had hip surgery and was doing well with physical therapy. There have not been any reports of incisional pain or signs of infection. Upon initial evaluation in the ED, she was hypotensive with a blood pressure of 86/47, febrile to 102.90F, WBC 19.8, hemoglobin 6.7, lactic acid 0.98. Empirically placed on vancomycin and Zosyn.  Assessment & Plan:   Principal Problem:   SIRS (systemic inflammatory response syndrome) (HCC) Active Problems:   Atrial fibrillation (HCC)   Acute on chronic systolic CHF (congestive heart failure) (HCC)   Hypokalemia   Acute blood loss anemia   Anxiety   CKD (chronic kidney disease), stage IV (HCC)  Principal Problem:   SIRS (systemic inflammatory response syndrome) (HCC)/Probable sepsis with suspected bacteremia -Patient initially febrile, and has known continuous IV access raising the concern for sepsis from bacterial contamination of her IV line.  -PICC removed. -Blood cultures pos for enterobacter and pan-sensitive ecoli species. -Patient had been continued on broad-spectrum antibiotics for now including vancomycin and Zosyn. Changed to rocephin. Will change to ampicillin - Will repeat blood cultures  Active Problems:   Chronic systolic CHF - EF 23-53 percent. Patient continued on milrinone and amiodarone.  - Appears compensated at this time - Cardiology continues to follow    Stage IV chronic kidney disease - Continue to avoid nephrotoxins and monitor creatinine. - remains stable at this  time    Atrial fibrillation (HCC) - Heart rate is in the 50s. Blood thinners on hold secondary to significant anemia. - remains rate controlled    Hypokalemia - Given oral supplementation. -Low today. Will replace - will recheck bmet in AM    Acute blood loss anemia - Given 1 unit of PRBCs. Fecal occult blood testing was negative, but suspected to have had a diverticular bleed that has resolved. - Hgb has remained stable overnight, hgb rising - Repeat CBC in AM    Anxiety - Continue BuSpar. - Remains stable    Severe protein calorie malnutrition/underweight - Body mass index is 19.42 kg/m. - remains stable  DVT prophylaxis: SCD's Code Status: Full Family Communication: Pt in room, son at bedside Disposition Plan: Uncertain at this time  Consultants:   Cardiology  Procedures:     Antimicrobials: Anti-infectives    Start     Dose/Rate Route Frequency Ordered Stop   05/28/17 0200  vancomycin (VANCOCIN) 500 mg in sodium chloride 0.9 % 100 mL IVPB  Status:  Discontinued     500 mg 100 mL/hr over 60 Minutes Intravenous Every 24 hours 05/27/17 1109 05/27/17 1948   05/27/17 2100  cefTRIAXone (ROCEPHIN) 2 g in dextrose 5 % 50 mL IVPB     2 g 100 mL/hr over 30 Minutes Intravenous Every 24 hours 05/27/17 1948     05/27/17 1200  piperacillin-tazobactam (ZOSYN) IVPB 2.25 g  Status:  Discontinued     2.25 g 100 mL/hr over 30 Minutes Intravenous Every 6 hours 05/27/17 1109 05/27/17 1948   05/27/17 0100  piperacillin-tazobactam (ZOSYN) IVPB 3.375 g     3.375  g 100 mL/hr over 30 Minutes Intravenous  Once 05/27/17 0052 05/27/17 0231   05/27/17 0100  vancomycin (VANCOCIN) IVPB 1000 mg/200 mL premix     1,000 mg 200 mL/hr over 60 Minutes Intravenous  Once 05/27/17 0052 05/27/17 0301      Subjective: Mildly confused. Denies chest pain or sob  Objective: Vitals:   05/29/17 0455 05/29/17 0500 05/29/17 0745 05/29/17 1156  BP:  (!) 147/79 (!) 146/74 123/87  Pulse:   74  72  Resp:   (!) 27 16  Temp:  97.6 F (36.4 C) 98.2 F (36.8 C) 98.7 F (37.1 C)  TempSrc:   Oral Oral  SpO2:   97% 97%  Weight: 38.2 kg (84 lb 3.5 oz)     Height:        Intake/Output Summary (Last 24 hours) at 05/29/17 1346 Last data filed at 05/29/17 1200  Gross per 24 hour  Intake           819.59 ml  Output                0 ml  Net           819.59 ml   Filed Weights   05/27/17 1721 05/28/17 0500 05/29/17 0455  Weight: 40.7 kg (89 lb 11.6 oz) 39.4 kg (86 lb 13.8 oz) 38.2 kg (84 lb 3.5 oz)    Examination: General exam: Awake, laying in bed, in nad Respiratory system: Normal respiratory effort, no wheezing Cardiovascular system: regular rate, s1, s2 Gastrointestinal system: Soft, nondistended, positive BS Central nervous system: CN2-12 grossly intact, strength intact Extremities: Perfused, no clubbing Skin: Normal skin turgor, no notable skin lesions seen Psychiatry: Mood normal // no visual hallucinations    Data Reviewed: I have personally reviewed following labs and imaging studies  CBC:  Recent Labs Lab 05/27/17 0128 05/27/17 0135 05/27/17 1036 05/28/17 0846 05/29/17 0509  WBC 19.8*  --  15.0* 10.8* 11.6*  NEUTROABS 18.3*  --   --   --   --   HGB 6.7* 6.1* 10.6* 11.3* 12.2  HCT 20.7* 18.0* 32.3* 35.5* 38.2  MCV 87.0  --  85.0 85.3 87.0  PLT 417*  --  306 314 122   Basic Metabolic Panel:  Recent Labs Lab 05/27/17 0128 05/27/17 0135 05/27/17 0453 05/28/17 0331 05/29/17 0509  NA 134* 134* 137 138 140  K 3.1* 3.1* 3.4* 3.1* 3.8  CL 100* 99* 102 104 107  CO2 24  --  26 25 25   GLUCOSE 100* 100* 92 89 82  BUN 28* 27* 26* 21* 18  CREATININE 1.07* 1.00 1.11* 0.96 0.87  CALCIUM 8.7*  --  8.5* 8.3* 8.6*   GFR: Estimated Creatinine Clearance: 25.4 mL/min (by C-G formula based on SCr of 0.87 mg/dL). Liver Function Tests:  Recent Labs Lab 05/27/17 0128  AST 18  ALT 10*  ALKPHOS 84  BILITOT 0.6  PROT 6.1*  ALBUMIN 3.1*   No results for  input(s): LIPASE, AMYLASE in the last 168 hours. No results for input(s): AMMONIA in the last 168 hours. Coagulation Profile: No results for input(s): INR, PROTIME in the last 168 hours. Cardiac Enzymes: No results for input(s): CKTOTAL, CKMB, CKMBINDEX, TROPONINI in the last 168 hours. BNP (last 3 results) No results for input(s): PROBNP in the last 8760 hours. HbA1C: No results for input(s): HGBA1C in the last 72 hours. CBG: No results for input(s): GLUCAP in the last 168 hours. Lipid Profile: No results for input(s): CHOL, HDL,  LDLCALC, TRIG, CHOLHDL, LDLDIRECT in the last 72 hours. Thyroid Function Tests: No results for input(s): TSH, T4TOTAL, FREET4, T3FREE, THYROIDAB in the last 72 hours. Anemia Panel: No results for input(s): VITAMINB12, FOLATE, FERRITIN, TIBC, IRON, RETICCTPCT in the last 72 hours. Sepsis Labs:  Recent Labs Lab 05/27/17 0136 05/27/17 0453 05/27/17 1038  PROCALCITON  --  40.60  --   LATICACIDVEN 0.98  --  0.8    Recent Results (from the past 240 hour(s))  Blood Culture (routine x 2)     Status: None (Preliminary result)   Collection Time: 05/27/17  1:20 AM  Result Value Ref Range Status   Specimen Description BLOOD RIGHT WRIST  Final   Special Requests   Final    BOTTLES DRAWN AEROBIC AND ANAEROBIC Blood Culture adequate volume   Culture NO GROWTH 1 DAY  Final   Report Status PENDING  Incomplete  Blood Culture (routine x 2)     Status: Abnormal (Preliminary result)   Collection Time: 05/27/17  1:26 AM  Result Value Ref Range Status   Specimen Description BLOOD RIGHT HAND  Final   Special Requests   Final    BOTTLES DRAWN AEROBIC AND ANAEROBIC Blood Culture adequate volume   Culture  Setup Time   Final    GRAM NEGATIVE RODS ANAEROBIC BOTTLE ONLY IDENTIFICATION TO FOLLOW CRITICAL RESULT CALLED TO, READ BACK BY AND VERIFIED WITH: Jerene Bears PHARMD 1934 05/27/17 A BROWNING    Culture ESCHERICHIA COLI (A)  Final   Report Status PENDING  Incomplete    Organism ID, Bacteria ESCHERICHIA COLI  Final      Susceptibility   Escherichia coli - MIC*    AMPICILLIN <=2 SENSITIVE Sensitive     CEFAZOLIN <=4 SENSITIVE Sensitive     CEFEPIME <=1 SENSITIVE Sensitive     CEFTAZIDIME <=1 SENSITIVE Sensitive     CEFTRIAXONE <=1 SENSITIVE Sensitive     CIPROFLOXACIN <=0.25 SENSITIVE Sensitive     GENTAMICIN <=1 SENSITIVE Sensitive     IMIPENEM <=0.25 SENSITIVE Sensitive     TRIMETH/SULFA <=20 SENSITIVE Sensitive     AMPICILLIN/SULBACTAM <=2 SENSITIVE Sensitive     PIP/TAZO <=4 SENSITIVE Sensitive     Extended ESBL NEGATIVE Sensitive     * ESCHERICHIA COLI  Blood Culture ID Panel (Reflexed)     Status: Abnormal   Collection Time: 05/27/17  1:26 AM  Result Value Ref Range Status   Enterococcus species NOT DETECTED NOT DETECTED Final   Listeria monocytogenes NOT DETECTED NOT DETECTED Final   Staphylococcus species NOT DETECTED NOT DETECTED Final   Staphylococcus aureus NOT DETECTED NOT DETECTED Final   Streptococcus species NOT DETECTED NOT DETECTED Final   Streptococcus agalactiae NOT DETECTED NOT DETECTED Final   Streptococcus pneumoniae NOT DETECTED NOT DETECTED Final   Streptococcus pyogenes NOT DETECTED NOT DETECTED Final   Acinetobacter baumannii NOT DETECTED NOT DETECTED Final   Enterobacteriaceae species DETECTED (A) NOT DETECTED Final    Comment: Enterobacteriaceae represent a large family of gram-negative bacteria, not a single organism. CRITICAL RESULT CALLED TO, READ BACK BY AND VERIFIED WITH: H BAIRD PHARMD 1934 05/27/17 A BROWNING    Enterobacter cloacae complex NOT DETECTED NOT DETECTED Final   Escherichia coli DETECTED (A) NOT DETECTED Final    Comment: CRITICAL RESULT CALLED TO, READ BACK BY AND VERIFIED WITH: H BAIRD PHARMD 1934 05/27/17 A BROWNING    Klebsiella oxytoca NOT DETECTED NOT DETECTED Final   Klebsiella pneumoniae NOT DETECTED NOT DETECTED Final   Proteus species  NOT DETECTED NOT DETECTED Final   Serratia marcescens  NOT DETECTED NOT DETECTED Final   Carbapenem resistance NOT DETECTED NOT DETECTED Final   Haemophilus influenzae NOT DETECTED NOT DETECTED Final   Neisseria meningitidis NOT DETECTED NOT DETECTED Final   Pseudomonas aeruginosa NOT DETECTED NOT DETECTED Final   Candida albicans NOT DETECTED NOT DETECTED Final   Candida glabrata NOT DETECTED NOT DETECTED Final   Candida krusei NOT DETECTED NOT DETECTED Final   Candida parapsilosis NOT DETECTED NOT DETECTED Final   Candida tropicalis NOT DETECTED NOT DETECTED Final  MRSA PCR Screening     Status: None   Collection Time: 05/27/17  7:50 PM  Result Value Ref Range Status   MRSA by PCR NEGATIVE NEGATIVE Final    Comment:        The GeneXpert MRSA Assay (FDA approved for NASAL specimens only), is one component of a comprehensive MRSA colonization surveillance program. It is not intended to diagnose MRSA infection nor to guide or monitor treatment for MRSA infections.      Radiology Studies: No results found.  Scheduled Meds: . acetaminophen  1,000 mg Oral TID  . amiodarone  100 mg Oral Daily  . apixaban  2.5 mg Oral BID  . furosemide  20 mg Intravenous Once  . torsemide  40 mg Oral BID   Continuous Infusions: . sodium chloride Stopped (05/27/17 0636)  . cefTRIAXone (ROCEPHIN)  IV Stopped (05/28/17 2031)  . milrinone 0.125 mcg/kg/min (05/29/17 1200)     LOS: 2 days   Jacqueline Reilly, Jacqueline Melter, MD [facility] Pager [phone]  If 7PM-7AM, please contact night-coverage [email or web address] Password [email or web address] 05/29/2017, 1:46 PM

## 2017-05-29 NOTE — Progress Notes (Signed)
Initial Nutrition Assessment  DOCUMENTATION CODES:   Underweight  INTERVENTION:   -Ensure Enlive po TID, each supplement provides 350 kcal and 20 grams of protein  NUTRITION DIAGNOSIS:   Predicted suboptimal nutrient intake related to chronic illness, poor appetite (dementia) as evidenced by percent weight loss.  GOAL:   Patient will meet greater than or equal to 90% of their needs  MONITOR:   PO intake, Supplement acceptance, Labs, Weight trends, Skin, I & O's  REASON FOR ASSESSMENT:   Malnutrition Screening Tool    ASSESSMENT:   Jacqueline Reilly is a 83 y.o. female with medical history significant of HTN, HLD, Afib, CHF last EF 20-25%, CAD s/p PCI, PVD, AAA, RAS; who presents with complaints of fever.  Pt admitted with SIRS/dementia.   Attempted to examine pt x 5, however, pt unavailable at times of visits.  Intake is variable; noted 0-100% meal completion per doc flowsheets.   Pt has experienced a 8.7% wt loss over the past 3 months. Suspect inadequate intake PTA secondary to weight loss and dementia.   Unable to complete Nutrition-Focused physical exam at this time.   RD suspects malnutrition, however, unable to identify at this time.   Medications reviewed and include lasix and dementia.   Labs reviewed.   Diet Order:  Diet Heart Room service appropriate? Yes; Fluid consistency: Thin  Skin:  Reviewed, no issues  Last BM:  05/29/17  Height:   Ht Readings from Last 1 Encounters:  05/27/17 4\' 9"  (1.448 m)    Weight:   Wt Readings from Last 1 Encounters:  05/29/17 84 lb 3.5 oz (38.2 kg)    Ideal Body Weight:  43.2 kg  BMI:  Body mass index is 18.22 kg/m.  Estimated Nutritional Needs:   Kcal:  1000-1200  Protein:  40-55 grams  Fluid:  > 1 L  EDUCATION NEEDS:   Education needs no appropriate at this time  Aranda Bihm A. Jimmye Norman, RD, LDN, CDE Pager: [phone] After hours Pager: [phone]

## 2017-05-29 NOTE — Care Management Note (Signed)
Case Management Note  Patient Details  Name: Jacqueline Reilly MRN: [id number] Date of Birth: 05/12/1924  Subjective/Objective:   Pt admitted with SIRS                   Action/Plan:   PTA from home with husband. CM was unable to engage in meaningful conversation with pt due to constant interruptions from husband at bedside, pt agreed for CM to contact son.  Both pt and husband adamatly denied pt being on IV milrinone in the home - however pt is.  Son states pt will have 24/7 caregiver/supervision at discharge.  Son also relayed safety concerns with father in the home - CSW consulted   Expected Discharge Date:                  Expected Discharge Plan:     In-House Referral:  Clinical Social Work  Discharge planning Services  CM Consult  Post Acute Care Choice:    Choice offered to:     DME Arranged:    DME Agency:     HH Arranged:    [place] Agency:     Status of Service:  In process, will continue to follow  If discussed at Long Length of Stay Meetings, dates discussed:    Additional Comments: 05/29/2017 Pt discussed in rounds this am with CSW present - staff also voiced concerns with interactions witnessed with pt, husband and son.  CM spoke with [facility] and agency will resume services however also voiced concerns with pt discharging back to the home without son.  [facility] provides private duty care assistance 6-8 hours a day in addition to [place] and SW.  CM contacted both HF team and attending - communicated concerns with pt returning in the home.  Per son his plan is to return to [place] and he will arrange for pt/father to have 24 hour supervision.  HF team continues to follow and will determine if pt is to discharge home on IV milrinone. Maryclare Labrador, RN 05/29/2017, 11:39 AM

## 2017-05-30 DIAGNOSIS — B9620 Unspecified Escherichia coli [E. coli] as the cause of diseases classified elsewhere: Secondary | ICD-10-CM

## 2017-05-30 DIAGNOSIS — I480 Paroxysmal atrial fibrillation: Secondary | ICD-10-CM

## 2017-05-30 LAB — BASIC METABOLIC PANEL
Anion gap: 9 (ref 5–15)
BUN: 17 mg/dL (ref 6–20)
CO2: 23 mmol/L (ref 22–32)
Calcium: 8.8 mg/dL — ABNORMAL LOW (ref 8.9–10.3)
Chloride: 107 mmol/L (ref 101–111)
Creatinine, Ser: 0.91 mg/dL (ref 0.44–1.00)
GFR calc Af Amer: >60 mL/min (ref >60)
GFR calc non Af Amer: 54 mL/min — ABNORMAL LOW (ref >60)
Glucose, Bld: 86 mg/dL (ref 65–99)
Potassium: 3.9 mmol/L (ref 3.5–5.1)
Sodium: 139 mmol/L (ref 135–145)

## 2017-05-30 LAB — CBC
HCT: 37.6 % (ref 36.0–46.0)
Hemoglobin: 12 g/dL (ref 12.0–15.0)
MCH: 27.3 pg (ref 26.0–34.0)
MCHC: 31.9 g/dL (ref 30.0–36.0)
MCV: 85.6 fL (ref 78.0–100.0)
Platelets: 387 10*3/uL (ref 150–400)
RBC: 4.39 MIL/uL (ref 3.87–5.11)
RDW: 14.1 % (ref 11.5–15.5)
WBC: 10.7 10*3/uL — ABNORMAL HIGH (ref 4.0–10.5)

## 2017-05-30 MED ORDER — MILRINONE LACTATE IN DEXTROSE 20-5 MG/100ML-% IV SOLN: 0.1250 ug/kg/min | INTRAVENOUS | 0 refills | Status: DC

## 2017-05-30 NOTE — Care Management Note (Addendum)
Case Management Note Original note by:  Maryclare Labrador, RN 05/29/2017, 11:39 AM  Patient Details  Name: Jacqueline Reilly MRN: [id number] Date of Birth: 1924/06/09  Subjective/Objective:   Pt admitted with SIRS                   Action/Plan:   PTA from home with husband. CM was unable to engage in meaningful conversation with pt due to constant interruptions from husband at bedside, pt agreed for CM to contact son.  Both pt and husband adamatly denied pt being on IV milrinone in the home - however pt is.  Son states pt will have 24/7 caregiver/supervision at discharge.  Son also relayed safety concerns with father in the home - CSW consulted   Expected Discharge Date:                  Expected Discharge Plan:  [place]  In-House Referral:  Clinical Social Work  Discharge planning Services  CM Consult  Post Acute Care Choice:  Home Health, Resumption of Svcs/PTA Provider Choice offered to:  Adult Children  DME Arranged:    DME Agency:     HH Arranged:  RN, PT, OT, Nurse's Aide, Social Work [employer] Agency:  [facility]  Status of Service:  In process, will continue to follow  If discussed at Long Length of Stay Meetings, dates discussed:    Additional Comments: 05/30/2017 Pt discussed in rounds this am with CSW present - staff also voiced concerns with interactions witnessed with pt, husband and son.  CM spoke with [facility] and agency will resume services however also voiced concerns with pt discharging back to the home without son.  [facility] provides private duty care assistance 6-8 hours a day in addition to [place] and SW.  CM contacted both HF team and attending - communicated concerns with pt returning in the home.  Per son his plan is to return to [place] and he will arrange for pt/father to have 24 hour supervision.  HF team continues to follow and will determine if pt is to discharge home on IV milrinone.  05/30/17 J. Easton Sivertson, RN, BSN CM referral  for home health needs.  Notified [facility] of possible discharge this weekend, per notes and need for [facility] follow up.  Notified Carolynn Sayers with [facility] that pt will need continuation of milrinone at home.  She will contact heart failure team for orders and coordinate home infusion upon discharge.     Reinaldo Raddle, RN, BSN  Trauma/Neuro ICU Case Manager [phone]

## 2017-05-30 NOTE — Progress Notes (Signed)
Pt confused and continues to get out of bed. Pt has a low bed with floor mat and bed alarm on bed. Pt also has a yellow fall bracelet and yellow socks. Spoke with MD and got order for [employer] cabin crew.

## 2017-05-30 NOTE — Clinical Social Work Note (Signed)
CSW discussed case with MD. PT/OT orders put in today. Will follow progress for evaluations to see if patient is appropriate for SNF placement. Per MD, patient will likely be a weekend discharge.  Dayton Scrape, [place]

## 2017-05-30 NOTE — Progress Notes (Signed)
PROGRESS NOTE    Jacqueline Reilly  WIO:[id number] DOB: 10-May-1924 DOA: 05/27/2017 PCP: [name], MD    Brief Narrative:  83 y.o. female with a PMH of hypertension, hyperlipidemia, atrial fibrillation, chronic systolic CHF with an EF of 25-30 percent on a chronic continuous IV milrinone infusion since 2015, CAD status post balloon angioplasty 2007 and DES 2010, PVD, AAA, RAS who was admitted 05/27/17 for evaluation of fever up to 102F. Of note, 2 months ago she had hip surgery and was doing well with physical therapy. There have not been any reports of incisional pain or signs of infection. Upon initial evaluation in the ED, she was hypotensive with a blood pressure of 86/47, febrile to 102.80F, WBC 19.8, hemoglobin 6.7, lactic acid 0.98. Empirically placed on vancomycin and Zosyn.  Assessment & Plan:   Principal Problem:   SIRS (systemic inflammatory response syndrome) (HCC) Active Problems:   Atrial fibrillation (HCC)   Acute on chronic systolic CHF (congestive heart failure) (HCC)   Hypokalemia   Acute blood loss anemia   Anxiety   CKD (chronic kidney disease), stage IV (HCC)  Principal Problem:   SIRS (systemic inflammatory response syndrome) (HCC)/Probable sepsis with suspected bacteremia -Patient initially febrile, and has known continuous IV access raising the concern for sepsis from bacterial contamination of her IV line.  -PICC was removed. -Blood cultures pos for enterobacter and pan-sensitive ecoli species. -Patient was initially on broad-spectrum antibiotics for now including vancomycin and Zosyn, later changed to rocephin.  -Cultures confirmed pan-sensitive ecoli and patient was continued on ampicillin - Repeat blood cultures on 8/2, thus far neg x 2 for <24hrs  Active Problems:   Chronic systolic CHF - EF 42-68 percent. Patient continued on milrinone and amiodarone.  - Appears compensated at this time - Appreciate input by Cardiology. Recommendation for home  milrinone therapy with 24hr supervision. - Discussed case with patient's son, who reports arranging 24hr supervision in anticipation for pending discharge in the next day or two. Patient's son is well aware of patient's and his father's advanced dementia and agrees that long-term placement would be ideal. However, patient's son feels that patient and her husband would feel more at ease at home and thus prefers d/c to home when medically stable. Patient's son states that should home situation prove to be dangerous, that he would arrange long-term placement at that time.    Stage IV chronic kidney disease - Continue to avoid nephrotoxins and monitor creatinine. - currently stable at this time    Atrial fibrillation (HCC) - Heart rate is in the 50s. Blood thinners on hold secondary to significant anemia. - currently rate controlled    Hypokalemia - Given oral supplementation. -repeat bmet in AM, continue to replace as needed    Acute blood loss anemia - Given 1 unit of PRBCs. Fecal occult blood testing was negative, but suspected to have had a diverticular bleed that has resolved. - Hgb has remained stable with no signs of bleeding    Anxiety - Continue BuSpar. - currently stable    Severe protein calorie malnutrition/underweight - Body mass index is 19.42 kg/m. - presently stable  DVT prophylaxis: SCD's Code Status: Full Family Communication: Pt in room, son at bedside Disposition Plan: Uncertain at this time  Consultants:   Cardiology  Procedures:     Antimicrobials: Anti-infectives    Start     Dose/Rate Route Frequency Ordered Stop   05/30/17 0100  ampicillin (OMNIPEN) 1 g in sodium chloride 0.9 % 50 mL  IVPB     1 g 150 mL/hr over 20 Minutes Intravenous Every 8 hours 05/29/17 2043     05/29/17 1800  ampicillin (OMNIPEN) 1 g in sodium chloride 0.9 % 50 mL IVPB  Status:  Discontinued     1 g 150 mL/hr over 20 Minutes Intravenous Every 6 hours 05/29/17 1611  05/29/17 2043   05/28/17 0200  vancomycin (VANCOCIN) 500 mg in sodium chloride 0.9 % 100 mL IVPB  Status:  Discontinued     500 mg 100 mL/hr over 60 Minutes Intravenous Every 24 hours 05/27/17 1109 05/27/17 1948   05/27/17 2100  cefTRIAXone (ROCEPHIN) 2 g in dextrose 5 % 50 mL IVPB  Status:  Discontinued     2 g 100 mL/hr over 30 Minutes Intravenous Every 24 hours 05/27/17 1948 05/29/17 1611   05/27/17 1200  piperacillin-tazobactam (ZOSYN) IVPB 2.25 g  Status:  Discontinued     2.25 g 100 mL/hr over 30 Minutes Intravenous Every 6 hours 05/27/17 1109 05/27/17 1948   05/27/17 0100  piperacillin-tazobactam (ZOSYN) IVPB 3.375 g     3.375 g 100 mL/hr over 30 Minutes Intravenous  Once 05/27/17 0052 05/27/17 0231   05/27/17 0100  vancomycin (VANCOCIN) IVPB 1000 mg/200 mL premix     1,000 mg 200 mL/hr over 60 Minutes Intravenous  Once 05/27/17 0052 05/27/17 0301      Subjective: Confused this AM, unaware of being in the hospital  Objective: Vitals:   05/30/17 0600 05/30/17 0802 05/30/17 1152 05/30/17 1330  BP:  (!) 145/85 119/77 119/77  Pulse: (!) 58 68 78 81  Resp: (!) 25 (!) 21 18   Temp:  98.1 F (36.7 C) (!) 97.3 F (36.3 C)   TempSrc:  Oral Oral   SpO2: 99% 94% 98% 96%  Weight: 39.7 kg (87 lb 9.6 oz)     Height:        Intake/Output Summary (Last 24 hours) at 05/30/17 1429 Last data filed at 05/30/17 1400  Gross per 24 hour  Intake            551.6 ml  Output              125 ml  Net            426.6 ml   Filed Weights   05/28/17 0500 05/29/17 0455 05/30/17 0600  Weight: 39.4 kg (86 lb 13.8 oz) 38.2 kg (84 lb 3.5 oz) 39.7 kg (87 lb 9.6 oz)    Examination: General exam: Conversant, in no acute distress Respiratory system: normal chest rise, clear, no audible wheezing Cardiovascular system: regular rhythm, s1-s2 Gastrointestinal system: Nondistended, nontender, pos BS Central nervous system: No seizures, no tremors Extremities: No cyanosis, no joint  deformities Skin: No rashes, no pallor Psychiatry: Affect normal // no auditory hallucinations   Data Reviewed: I have personally reviewed following labs and imaging studies  CBC:  Recent Labs Lab 05/27/17 0128 05/27/17 0135 05/27/17 1036 05/28/17 0846 05/29/17 0509 05/30/17 0309  WBC 19.8*  --  15.0* 10.8* 11.6* 10.7*  NEUTROABS 18.3*  --   --   --   --   --   HGB 6.7* 6.1* 10.6* 11.3* 12.2 12.0  HCT 20.7* 18.0* 32.3* 35.5* 38.2 37.6  MCV 87.0  --  85.0 85.3 87.0 85.6  PLT 417*  --  306 314 370 665   Basic Metabolic Panel:  Recent Labs Lab 05/27/17 0128 05/27/17 0135 05/27/17 0453 05/28/17 0331 05/29/17 0509 05/30/17 0309  NA 134*  134* 137 138 140 139  K 3.1* 3.1* 3.4* 3.1* 3.8 3.9  CL 100* 99* 102 104 107 107  CO2 24  --  26 25 25 23   GLUCOSE 100* 100* 92 89 82 86  BUN 28* 27* 26* 21* 18 17  CREATININE 1.07* 1.00 1.11* 0.96 0.87 0.91  CALCIUM 8.7*  --  8.5* 8.3* 8.6* 8.8*   GFR: Estimated Creatinine Clearance: 24.5 mL/min (by C-G formula based on SCr of 0.91 mg/dL). Liver Function Tests:  Recent Labs Lab 05/27/17 0128  AST 18  ALT 10*  ALKPHOS 84  BILITOT 0.6  PROT 6.1*  ALBUMIN 3.1*   No results for input(s): LIPASE, AMYLASE in the last 168 hours. No results for input(s): AMMONIA in the last 168 hours. Coagulation Profile: No results for input(s): INR, PROTIME in the last 168 hours. Cardiac Enzymes: No results for input(s): CKTOTAL, CKMB, CKMBINDEX, TROPONINI in the last 168 hours. BNP (last 3 results) No results for input(s): PROBNP in the last 8760 hours. HbA1C: No results for input(s): HGBA1C in the last 72 hours. CBG: No results for input(s): GLUCAP in the last 168 hours. Lipid Profile: No results for input(s): CHOL, HDL, LDLCALC, TRIG, CHOLHDL, LDLDIRECT in the last 72 hours. Thyroid Function Tests: No results for input(s): TSH, T4TOTAL, FREET4, T3FREE, THYROIDAB in the last 72 hours. Anemia Panel: No results for input(s): VITAMINB12,  FOLATE, FERRITIN, TIBC, IRON, RETICCTPCT in the last 72 hours. Sepsis Labs:  Recent Labs Lab 05/27/17 0136 05/27/17 0453 05/27/17 1038  PROCALCITON  --  40.60  --   LATICACIDVEN 0.98  --  0.8    Recent Results (from the past 240 hour(s))  Blood Culture (routine x 2)     Status: None (Preliminary result)   Collection Time: 05/27/17  1:20 AM  Result Value Ref Range Status   Specimen Description BLOOD RIGHT WRIST  Final   Special Requests   Final    BOTTLES DRAWN AEROBIC AND ANAEROBIC Blood Culture adequate volume   Culture NO GROWTH 3 DAYS  Final   Report Status PENDING  Incomplete  Blood Culture (routine x 2)     Status: Abnormal   Collection Time: 05/27/17  1:26 AM  Result Value Ref Range Status   Specimen Description BLOOD RIGHT HAND  Final   Special Requests   Final    BOTTLES DRAWN AEROBIC AND ANAEROBIC Blood Culture adequate volume   Culture  Setup Time   Final    GRAM NEGATIVE RODS ANAEROBIC BOTTLE ONLY CRITICAL RESULT CALLED TO, READ BACK BY AND VERIFIED WITH: Jerene Bears PHARMD 1934 05/27/17 A BROWNING    Culture ESCHERICHIA COLI (A)  Final   Report Status 05/29/2017 FINAL  Final   Organism ID, Bacteria ESCHERICHIA COLI  Final      Susceptibility   Escherichia coli - MIC*    AMPICILLIN <=2 SENSITIVE Sensitive     CEFAZOLIN <=4 SENSITIVE Sensitive     CEFEPIME <=1 SENSITIVE Sensitive     CEFTAZIDIME <=1 SENSITIVE Sensitive     CEFTRIAXONE <=1 SENSITIVE Sensitive     CIPROFLOXACIN <=0.25 SENSITIVE Sensitive     GENTAMICIN <=1 SENSITIVE Sensitive     IMIPENEM <=0.25 SENSITIVE Sensitive     TRIMETH/SULFA <=20 SENSITIVE Sensitive     AMPICILLIN/SULBACTAM <=2 SENSITIVE Sensitive     PIP/TAZO <=4 SENSITIVE Sensitive     Extended ESBL NEGATIVE Sensitive     * ESCHERICHIA COLI  Blood Culture ID Panel (Reflexed)     Status: Abnormal  Collection Time: 05/27/17  1:26 AM  Result Value Ref Range Status   Enterococcus species NOT DETECTED NOT DETECTED Final   Listeria  monocytogenes NOT DETECTED NOT DETECTED Final   Staphylococcus species NOT DETECTED NOT DETECTED Final   Staphylococcus aureus NOT DETECTED NOT DETECTED Final   Streptococcus species NOT DETECTED NOT DETECTED Final   Streptococcus agalactiae NOT DETECTED NOT DETECTED Final   Streptococcus pneumoniae NOT DETECTED NOT DETECTED Final   Streptococcus pyogenes NOT DETECTED NOT DETECTED Final   Acinetobacter baumannii NOT DETECTED NOT DETECTED Final   Enterobacteriaceae species DETECTED (A) NOT DETECTED Final    Comment: Enterobacteriaceae represent a large family of gram-negative bacteria, not a single organism. CRITICAL RESULT CALLED TO, READ BACK BY AND VERIFIED WITH: Jerene Bears PHARMD 1934 05/27/17 A BROWNING    Enterobacter cloacae complex NOT DETECTED NOT DETECTED Final   Escherichia coli DETECTED (A) NOT DETECTED Final    Comment: CRITICAL RESULT CALLED TO, READ BACK BY AND VERIFIED WITH: Jerene Bears PHARMD 1934 05/27/17 A BROWNING    Klebsiella oxytoca NOT DETECTED NOT DETECTED Final   Klebsiella pneumoniae NOT DETECTED NOT DETECTED Final   Proteus species NOT DETECTED NOT DETECTED Final   Serratia marcescens NOT DETECTED NOT DETECTED Final   Carbapenem resistance NOT DETECTED NOT DETECTED Final   Haemophilus influenzae NOT DETECTED NOT DETECTED Final   Neisseria meningitidis NOT DETECTED NOT DETECTED Final   Pseudomonas aeruginosa NOT DETECTED NOT DETECTED Final   Candida albicans NOT DETECTED NOT DETECTED Final   Candida glabrata NOT DETECTED NOT DETECTED Final   Candida krusei NOT DETECTED NOT DETECTED Final   Candida parapsilosis NOT DETECTED NOT DETECTED Final   Candida tropicalis NOT DETECTED NOT DETECTED Final  MRSA PCR Screening     Status: None   Collection Time: 05/27/17  7:50 PM  Result Value Ref Range Status   MRSA by PCR NEGATIVE NEGATIVE Final    Comment:        The GeneXpert MRSA Assay (FDA approved for NASAL specimens only), is one component of a comprehensive MRSA  colonization surveillance program. It is not intended to diagnose MRSA infection nor to guide or monitor treatment for MRSA infections.   Culture, blood (routine x 2)     Status: None (Preliminary result)   Collection Time: 05/29/17  4:57 PM  Result Value Ref Range Status   Specimen Description BLOOD LEFT HAND  Final   Special Requests IN PEDIATRIC BOTTLE Blood Culture adequate volume  Final   Culture NO GROWTH < 24 HOURS  Final   Report Status PENDING  Incomplete  Culture, blood (routine x 2)     Status: None (Preliminary result)   Collection Time: 05/29/17  4:57 PM  Result Value Ref Range Status   Specimen Description BLOOD RIGHT ARM  Final   Special Requests IN PEDIATRIC BOTTLE Blood Culture adequate volume  Final   Culture NO GROWTH < 24 HOURS  Final   Report Status PENDING  Incomplete     Radiology Studies: No results found.  Scheduled Meds: . acetaminophen  1,000 mg Oral TID  . amiodarone  100 mg Oral Daily  . apixaban  2.5 mg Oral BID  . feeding supplement (ENSURE ENLIVE)  237 mL Oral TID BM  . furosemide  20 mg Intravenous Once  . torsemide  40 mg Oral BID   Continuous Infusions: . sodium chloride Stopped (05/27/17 0636)  . ampicillin (OMNIPEN) IV Stopped (05/30/17 0901)  . milrinone 0.125 mcg/kg/min (05/30/17 1400)  LOS: 3 days   CHIU, Orpah Melter, MD [facility] Pager [phone]  If 7PM-7AM, please contact night-coverage [email or web address] Password [email or web address] 05/30/2017, 2:29 PM
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
## 2017-05-30 NOTE — Evaluation (Signed)
Occupational Therapy Evaluation Patient Details Name: Jacqueline Reilly MRN: [id number] DOB: [birth year] Today's Date: 05/30/2017    History of Present Illness  83 y.o. female with medical history significant of left anterior hip hemi 02/2017, HTN, HLD, Afib, CHF last EF 20-25%, CAD s/p PCI, PVD, AAA who presents with complaints of fever, SIRS   Clinical Impression   Pt was living at home with her husband. Present with significant immediate memory loss, generalized weakness and impaired standing balance. Pt needs 24 close, competent supervision and assist upon discharge. Recommending SNF.    Follow Up Recommendations  SNF;Supervision/Assistance - 24 hour    Equipment Recommendations       Recommendations for Other Services       Precautions / Restrictions Precautions Precautions: Fall Restrictions Weight Bearing Restrictions: No      Mobility Bed Mobility Overal bed mobility: Needs Assistance Bed Mobility: Supine to Sit;Sit to Supine    Supine to sit: Supervision Sit to supine: Supervision   General bed mobility comments: no physical assist, used rail  Transfers Overall transfer level: Needs assistance Equipment used: 1 person hand held assist Transfers: Sit to/from [facility] Sit to Stand: Min guard Stand pivot transfers: Min assist       General transfer comment: hand held assist    Balance Overall balance assessment: Needs assistance Sitting-balance support: Feet unsupported;Bilateral upper extremity supported Sitting balance-Leahy Scale: Fair     Standing balance support: Single extremity supported;During functional activity Standing balance-Leahy Scale: Poor Standing balance comment: pt requires at least one hand held                           ADL either performed or assessed with clinical judgement   ADL Overall ADL's : Needs assistance/impaired Eating/Feeding: Independent;Sitting   Grooming: Supervision/safety;Sitting    Upper Body Bathing: Supervision/ safety;Sitting   Lower Body Bathing: Minimal assistance;Sit to/from stand   Upper Body Dressing : Supervision/safety;Sitting   Lower Body Dressing: Minimal assistance;Sit to/from stand   Toilet Transfer: Minimal assistance;Stand-pivot;BSC   Toileting- Clothing Manipulation and Hygiene: Minimal assistance;Sit to/from stand               Vision Baseline Vision/History: Wears glasses Wears Glasses: Reading only       Perception     Praxis      Pertinent Vitals/Pain Pain Assessment: No/denies pain     Hand Dominance Right   Extremity/Trunk Assessment Upper Extremity Assessment Upper Extremity Assessment: Overall WFL for tasks assessed   Lower Extremity Assessment Lower Extremity Assessment: Defer to PT evaluation   Cervical / Trunk Assessment Cervical / Trunk Assessment: Kyphotic   Communication Communication Communication: HOH   Cognition Arousal/Alertness: Awake/alert Behavior During Therapy: Impulsive Overall Cognitive Status: History of cognitive impairments - at baseline                                 General Comments: Pt has dementia, poor immediate memory.   General Comments       Exercises     Shoulder Instructions      Home Living Family/patient expects to be discharged to:: Private residence Living Arrangements: Spouse/significant other Available Help at Discharge: Family;Available 24 hours/day Type of Home: House Home Access: Stairs to enter [employer] of Steps: 3 Entrance Stairs-Rails: None Home Layout: One level     Bathroom Shower/Tub: Teacher, early years/pre: Standard  Home Equipment: Kasandra Knudsen - single point;Walker - 2 wheels          Prior Functioning/Environment Level of Independence: Needs assistance  Gait / Transfers Assistance Needed: pt states she walks and transfers on her own , cane at times ADL's / Homemaking Assistance Needed: spouse does the  cooking, she does not drive, sponge bathes            OT Problem List: Impaired balance (sitting and/or standing);Decreased cognition;Decreased safety awareness;Decreased knowledge of use of DME or AE      OT Treatment/Interventions:      OT Goals(Current goals can be found in the care plan section) Acute Rehab OT Goals Patient Stated Goal: go home   OT Frequency:     Barriers to D/C:            Co-evaluation              AM-PAC PT "6 Clicks" Daily Activity     Outcome Measure Help from another person eating meals?: None Help from another person taking care of personal grooming?: A Little Help from another person toileting, which includes using toliet, bedpan, or urinal?: A Little Help from another person bathing (including washing, rinsing, drying)?: A Little Help from another person to put on and taking off regular upper body clothing?: None Help from another person to put on and taking off regular lower body clothing?: A Little 6 Click Score: 20   End of Session Equipment Utilized During Treatment: Gait belt  Activity Tolerance: Patient tolerated treatment well Patient left: in bed;with call bell/phone within reach;with bed alarm set  OT Visit Diagnosis: Other symptoms and signs involving cognitive function;Unsteadiness on feet (R26.81)                Time: 6122-4497 OT Time Calculation (min): 20 min Charges:  OT General Charges $OT Visit: 1 Procedure OT Evaluation $OT Eval Moderate Complexity: 1 Procedure G-Codes:      Malka So 05/30/2017, 3:25 PM  [phone]
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
## 2017-05-30 NOTE — Evaluation (Signed)
Physical Therapy Evaluation Patient Details Name: Jacqueline Reilly MRN: [id number] DOB: 1924/05/13 Today's Date: 05/30/2017   History of Present Illness   83 y.o. female with medical history significant of left anterior hip hemi 02/2017, HTN, HLD, Afib, CHF last EF 20-25%, CAD s/p PCI, PVD, AAA who presents with complaints of fever, SIRS  Clinical Impression  Pt A&O x 1 and states she is ready to go home and asks when the MD will be coming to see her to be able to go home. Pt agreeable to mobilize with PT. Upon standing from EOB, pt is incontinent with urine and transferred to [facility]. Pt perseverates on asking where her husband is and that she is upset for being incontinent. Pt able to ambulate in hallway with RW; however needs assistance to maintain balance and navigate. Pt presents with deficits listed in PT problem list below and will benefit from continued acute therapy for mobilization, safety education, and strengthening. Feel pt will not be safe to go home as her and husband both have severe dementia. SNF recommended.   Vitals:  BP 117/77 HR 81 SpO2 96% on RA    Follow Up Recommendations SNF;Supervision/Assistance - 24 hour    Equipment Recommendations  Rolling walker with 5" wheels;3in1 (PT)    Recommendations for Other Services OT consult     Precautions / Restrictions Precautions Precautions: Fall Restrictions Weight Bearing Restrictions: No      Mobility  Bed Mobility Overal bed mobility: Needs Assistance Bed Mobility: Supine to Sit   Sidelying to sit: Min guard       General bed mobility comments: Min guard for safety. VCs for hand placement   Transfers Overall transfer level: Needs assistance   Transfers: Sit to/from Stand Sit to Stand: Min guard         General transfer comment: Min guard for safety and VCs for hand placement   Ambulation/Gait Ambulation/Gait assistance: Min assist Ambulation Distance (Feet): 350 Feet Assistive device: Rolling walker (2  wheeled) Gait Pattern/deviations: Step-through pattern;Decreased stride length;Trunk flexed;Drifts right/left Gait velocity: slow Gait velocity interpretation: Below normal speed for age/gender General Gait Details: Pt drifts right and left and requires min A to maintain balance and navigate RW   Stairs            Wheelchair Mobility    Modified Rankin (Stroke Patients Only)       Balance Overall balance assessment: Needs assistance Sitting-balance support: Feet unsupported;Bilateral upper extremity supported Sitting balance-Leahy Scale: Fair     Standing balance support: Bilateral upper extremity supported;During functional activity Standing balance-Leahy Scale: Poor Standing balance comment: Pt requires use of RW for safe ambulation                              Pertinent Vitals/Pain Pain Assessment: No/denies pain    Home Living Family/patient expects to be discharged to:: Private residence Living Arrangements: Spouse/significant other Available Help at Discharge: Family;Available 24 hours/day Type of Home: House Home Access: Stairs to enter Entrance Stairs-Rails: None Entrance Stairs-Number of Steps: 3 Home Layout: One level Home Equipment: Cane - single point;Walker - 2 wheels      Prior Function Level of Independence: Needs assistance   Gait / Transfers Assistance Needed: pt states she walks and transfers on her own , cane at times  ADL's / Homemaking Assistance Needed: spouse does the cooking, she does not drive, sponge bathes        Hand Dominance  Dominant Hand: Right    Extremity/Trunk Assessment   Upper Extremity Assessment Upper Extremity Assessment: Overall WFL for tasks assessed    Lower Extremity Assessment Lower Extremity Assessment: Generalized weakness    Cervical / Trunk Assessment Cervical / Trunk Assessment: Kyphotic  Communication   Communication: HOH  Cognition Arousal/Alertness: Awake/alert Behavior During  Therapy: WFL for tasks assessed/performed Overall Cognitive Status: History of cognitive impairments - at baseline                                 General Comments: Pt has dementia       General Comments      Exercises     Assessment/Plan    PT Assessment Patient needs continued PT services  PT Problem List Decreased strength;Decreased activity tolerance;Decreased balance;Decreased mobility;Decreased cognition;Decreased knowledge of use of DME;Decreased safety awareness       PT Treatment Interventions DME instruction;Gait training;Functional mobility training;Therapeutic activities;Therapeutic exercise;Balance training;Patient/family education;Cognitive remediation    PT Goals (Current goals can be found in the Care Plan section)  Acute Rehab PT Goals Patient Stated Goal: go home  PT Goal Formulation: With patient Time For Goal Achievement: 06/13/17 Potential to Achieve Goals: Poor    Frequency Min 2X/week   Barriers to discharge        Co-evaluation               AM-PAC PT "6 Clicks" Daily Activity  Outcome Measure Difficulty turning over in bed (including adjusting bedclothes, sheets and blankets)?: None Difficulty moving from lying on back to sitting on the side of the bed? : A Little Difficulty sitting down on and standing up from a chair with arms (e.g., wheelchair, bedside commode, etc,.)?: A Little Help needed moving to and from a bed to chair (including a wheelchair)?: A Little Help needed walking in hospital room?: A Little Help needed climbing 3-5 steps with a railing? : A Lot 6 Click Score: 18    End of Session Equipment Utilized During Treatment: Gait belt Activity Tolerance: Patient tolerated treatment well Patient left: in chair;with call bell/phone within reach;with chair alarm set   PT Visit Diagnosis: Unsteadiness on feet (R26.81);Other abnormalities of gait and mobility (R26.89);Difficulty in walking, not elsewhere classified  (R26.2);Muscle weakness (generalized) (M62.81)    Time: 1200-1230 PT Time Calculation (min) (ACUTE ONLY): 30 min   Charges:   PT Evaluation $PT Eval Moderate Complexity: 1 Mod PT Treatments $Gait Training: 8-22 mins   PT G Codes:        Elberta Leatherwood, SPT Acute Rehab [place] 05/30/2017, 2:36 PM

## 2017-05-30 NOTE — Progress Notes (Signed)
Advanced Heart Failure Rounding Note  PCP: Dr Laurann [place] Primary Cardiologist: Dr Haroldine Laws   Subjective:    Admitted with fever- SIRS Blood CX 1/2 E Coli (which is an enterobacter species). Antibiotics narrowed to rocephin.  PICC removed.  Continues on milrinone 0.125 mcg. Now on ampicillin. BCx redrawn  Echo reviewed personally EF 25-30% no evidence vegetation   Remains confused. No HF symptoms.   Objective:   Weight Range: 39.7 kg (87 lb 9.6 oz) Body mass index is 18.96 kg/m.   Vital Signs:   Temp:  [98 F (36.7 C)-98.7 F (37.1 C)] 98 F (36.7 C) (08/03 0312) Pulse Rate:  [58-74] 58 (08/03 0600) Resp:  [16-27] 25 (08/03 0600) BP: (123-146)/(73-87) 142/81 (08/02 1934) SpO2:  [96 %-99 %] 99 % (08/03 0600) Weight:  [39.7 kg (87 lb 9.6 oz)] 39.7 kg (87 lb 9.6 oz) (08/03 0600) Last BM Date: 05/29/17  Weight change: Filed Weights   05/28/17 0500 05/29/17 0455 05/30/17 0600  Weight: 39.4 kg (86 lb 13.8 oz) 38.2 kg (84 lb 3.5 oz) 39.7 kg (87 lb 9.6 oz)    Intake/Output:   Intake/Output Summary (Last 24 hours) at 05/30/17 0719 Last data filed at 05/30/17 0300  Gross per 24 hour  Intake            495.2 ml  Output                0 ml  Net            495.2 ml      Physical Exam    General:  Elderly. Confused. No resp difficulty HEENT: normal Neck: supple. JVP 5 Carotids 2+ bilat; no bruits. No lymphadenopathy or thryomegaly appreciated. Cor: PMI laterally displaced. Regular rate & rhythm. 2/6 AS Lungs: clear Abdomen: soft, nontender, nondistended. No hepatosplenomegaly. No bruits or masses. Good bowel sounds. Extremities: no cyanosis, clubbing, rash, edema Neuro: alert & orientedx3, cranial nerves grossly intact. moves all 4 extremities w/o difficulty. Affect pleasant   Telemetry   NSR 50-60s  Personally reviewed.   EKG    NSR with LBBB with PACs  Labs    CBC  Recent Labs  05/29/17 0509 05/30/17 0309  WBC 11.6* 10.7*  HGB 12.2 12.0  HCT 38.2  37.6  MCV 87.0 85.6  PLT 370 284   Basic Metabolic Panel  Recent Labs  05/29/17 0509 05/30/17 0309  NA 140 139  K 3.8 3.9  CL 107 107  CO2 25 23  GLUCOSE 82 86  BUN 18 17  CREATININE 0.87 0.91  CALCIUM 8.6* 8.8*   Liver Function Tests No results for input(s): AST, ALT, ALKPHOS, BILITOT, PROT, ALBUMIN in the last 72 hours. No results for input(s): LIPASE, AMYLASE in the last 72 hours. Cardiac Enzymes No results for input(s): CKTOTAL, CKMB, CKMBINDEX, TROPONINI in the last 72 hours.  BNP: BNP (last 3 results)  Recent Labs  03/04/17 1235 04/04/17 2331 05/27/17 0324  BNP 69.6 412.3* 384.0*    ProBNP (last 3 results) No results for input(s): PROBNP in the last 8760 hours.   D-Dimer No results for input(s): DDIMER in the last 72 hours. Hemoglobin A1C No results for input(s): HGBA1C in the last 72 hours. Fasting Lipid Panel No results for input(s): CHOL, HDL, LDLCALC, TRIG, CHOLHDL, LDLDIRECT in the last 72 hours. Thyroid Function Tests No results for input(s): TSH, T4TOTAL, T3FREE, THYROIDAB in the last 72 hours.  Invalid input(s): FREET3  Other results:   Imaging  No results found.   Medications:     Scheduled Medications: . acetaminophen  1,000 mg Oral TID  . amiodarone  100 mg Oral Daily  . apixaban  2.5 mg Oral BID  . feeding supplement (ENSURE ENLIVE)  237 mL Oral TID BM  . furosemide  20 mg Intravenous Once  . torsemide  40 mg Oral BID    Infusions: . sodium chloride Stopped (05/27/17 0636)  . ampicillin (OMNIPEN) IV Stopped (05/30/17 0219)  . milrinone 0.125 mcg/kg/min (05/29/17 1814)    PRN Medications: acetaminophen **OR** acetaminophen, albuterol, busPIRone, ondansetron **OR** ondansetron (ZOFRAN) IV    Patient Profile  Jacqueline Reilly is a 83 year old with a history of ICM, dementia, end stage systolic heart failure on milrinone 0.125 mcg followed closely in the HF clinic admitted with fever.    Assessment/Plan   1. E.coli  rosepsis  - Doing well on abx.  - PICC removed. Will need to replace once surveillance cultures negatvie x 48 hours - Per primary team 2. Anemia- hgb on admit 6.7  - back on eliquis for PAF. Hgb stable  3. End Stage Systolic Heart Failure - on home milrinone 0.125 mcg. See above remove PICC can run milrinone through PIV.  -Volume status stable ofdiuretics. No bb with low output.  - EF stable at 25-30% on echo yesterday 4. PAF - rate slow. Continue low dose amio. Apixaban restarted  This patients CHA2DS2-VASc Score is 6 5. Dementia 6. Hypokalemia- resolved.   Stable from HF perspective. Once surveillance cultures negative x 48 hours can replace PICC (single lumen) and send home back on milrinone. Home milrinone therapy has been complicated due to husband's belligerence to support staff. Son says they are hiring 24 hour help to support. If this is not in place would not continue to use milrinone.   The HF team will sign off. Please call with questions.  Length of Stay: 3   Glori Bickers, MD  05/30/2017, 7:19 AM  Advanced Heart Failure Team Pager [phone] (M-F; 7a - 4p)  Please contact [facility] for night-coverage after hours (4p -7a ) and weekends on [email or web address]

## 2017-05-31 MED ORDER — HALOPERIDOL LACTATE 5 MG/ML IJ SOLN
2.0000 mg | Freq: Four times a day (QID) | INTRAMUSCULAR | Status: DC | PRN
  Administered 2017-05-31 – 2017-06-01 (×2): 2 mg via INTRAVENOUS
  Filled 2017-05-31 (×2): qty 1

## 2017-05-31 MED ORDER — TRIAMCINOLONE ACETONIDE 0.1 % EX CREA
TOPICAL_CREAM | Freq: Three times a day (TID) | CUTANEOUS | Status: DC
  Administered 2017-05-31: 17:00:00 via TOPICAL
  Administered 2017-05-31: 1 via TOPICAL
  Administered 2017-05-31 – 2017-06-02 (×3): via TOPICAL
  Filled 2017-05-31: qty 15

## 2017-05-31 NOTE — Progress Notes (Signed)
Pt had 2 four beat runs of V-Tach, asymptomatic. Spouse in the room and pt is anxious. Dr Wyline Copas notified.

## 2017-05-31 NOTE — Progress Notes (Signed)
PROGRESS NOTE    Jacqueline Reilly  TML:[id number] DOB: 1924-04-21 DOA: 05/27/2017 PCP: [name], MD    Brief Narrative:  83 y.o. female with a PMH of hypertension, hyperlipidemia, atrial fibrillation, chronic systolic CHF with an EF of 25-30 percent on a chronic continuous IV milrinone infusion since 2015, CAD status post balloon angioplasty 2007 and DES 2010, PVD, AAA, RAS who was admitted 05/27/17 for evaluation of fever up to 102F. Of note, 2 months ago she had hip surgery and was doing well with physical therapy. There have not been any reports of incisional pain or signs of infection. Upon initial evaluation in the ED, she was hypotensive with a blood pressure of 86/47, febrile to 102.55F, WBC 19.8, hemoglobin 6.7, lactic acid 0.98. Empirically placed on vancomycin and Zosyn.  Assessment & Plan:   Principal Problem:   SIRS (systemic inflammatory response syndrome) (HCC) Active Problems:   Atrial fibrillation (HCC)   Acute on chronic systolic CHF (congestive heart failure) (HCC)   Hypokalemia   Acute blood loss anemia   Anxiety   CKD (chronic kidney disease), stage IV (HCC)  Principal Problem:   SIRS (systemic inflammatory response syndrome) (HCC)/Probable sepsis with suspected bacteremia - Patient initially febrile, and has known continuous IV access raising the concern for sepsis from bacterial contamination of her IV line.  -PICC has been removed. -Blood cultures pos for enterobacter and pan-sensitive ecoli species. -Patient was initially on broad-spectrum antibiotics for now including vancomycin and Zosyn, patient was later changed to rocephin.  - Cultures confirmed pan-sensitive ecoli and patient was continued on ampicillin - Repeat blood cultures were obtained on 8/2, currently neg x <24hrs. When neg for >24hrs, would place new PICC  Active Problems:   Chronic systolic CHF - EF 68-12 percent. Patient continued on milrinone and amiodarone.  - Appears compensated at this  time - Appreciate input by Cardiology. Recommendation for home milrinone therapy with 24hr supervision. - Discussed case with patient's son, who reports arranging 24hr supervision in anticipation for pending discharge in the next day or two. Patient's son is well aware of patient's and his father's advanced dementia and agrees that long-term placement would be ideal. However, patient's son feels that patient and her husband would feel more at ease at home and thus prefers d/c to home when medically stable. Patient's son states that should home situation prove to be dangerous, that he would arrange long-term placement at that time. - Patient likely has severe dementia and has no insight to her current state of health. Patient's husband also has proven to have no insight to patient's care or ability to make appropriate medical decisions. - Stable at this time    Stage IV chronic kidney disease - Continue to avoid nephrotoxins and monitor creatinine. - currently stable at this time    Atrial fibrillation (HCC) - Heart rate is in the 50s. Blood thinners on hold secondary to significant anemia. - remains rate controlled    Hypokalemia - Given oral supplementation. - Stable at present. Continue to replete as needed    Acute blood loss anemia - Given 1 unit of PRBCs. Fecal occult blood testing was negative, but suspected to have had a diverticular bleed that has resolved. - Hemoglobin has remained stable    Anxiety - Continue BuSpar. - presently stable    Severe protein calorie malnutrition/underweight - Body mass index is 19.42 kg/m. - stable at present  DVT prophylaxis: SCD's Code Status: Full Family Communication: Pt in room, son at bedside  Disposition Plan: Uncertain at this time  Consultants:   Cardiology  Procedures:     Antimicrobials: Anti-infectives    Start     Dose/Rate Route Frequency Ordered Stop   05/30/17 0100  ampicillin (OMNIPEN) 1 g in sodium chloride  0.9 % 50 mL IVPB     1 g 150 mL/hr over 20 Minutes Intravenous Every 8 hours 05/29/17 2043     05/29/17 1800  ampicillin (OMNIPEN) 1 g in sodium chloride 0.9 % 50 mL IVPB  Status:  Discontinued     1 g 150 mL/hr over 20 Minutes Intravenous Every 6 hours 05/29/17 1611 05/29/17 2043   05/28/17 0200  vancomycin (VANCOCIN) 500 mg in sodium chloride 0.9 % 100 mL IVPB  Status:  Discontinued     500 mg 100 mL/hr over 60 Minutes Intravenous Every 24 hours 05/27/17 1109 05/27/17 1948   05/27/17 2100  cefTRIAXone (ROCEPHIN) 2 g in dextrose 5 % 50 mL IVPB  Status:  Discontinued     2 g 100 mL/hr over 30 Minutes Intravenous Every 24 hours 05/27/17 1948 05/29/17 1611   05/27/17 1200  piperacillin-tazobactam (ZOSYN) IVPB 2.25 g  Status:  Discontinued     2.25 g 100 mL/hr over 30 Minutes Intravenous Every 6 hours 05/27/17 1109 05/27/17 1948   05/27/17 0100  piperacillin-tazobactam (ZOSYN) IVPB 3.375 g     3.375 g 100 mL/hr over 30 Minutes Intravenous  Once 05/27/17 0052 05/27/17 0231   05/27/17 0100  vancomycin (VANCOCIN) IVPB 1000 mg/200 mL premix     1,000 mg 200 mL/hr over 60 Minutes Intravenous  Once 05/27/17 0052 05/27/17 0301      Subjective: No complaints this AM  Objective: Vitals:   05/31/17 0319 05/31/17 0539 05/31/17 0718 05/31/17 1158  BP: 140/77  (!) 141/87 135/87  Pulse: 74  71 74  Resp: (!) 22  18 19   Temp: 97.7 F (36.5 C)  97.8 F (36.6 C) 97.9 F (36.6 C)  TempSrc: Oral  Oral Oral  SpO2: 96%  96% 97%  Weight:  46.3 kg (102 lb)    Height:        Intake/Output Summary (Last 24 hours) at 05/31/17 1348 Last data filed at 05/31/17 1342  Gross per 24 hour  Intake            756.8 ml  Output                0 ml  Net            756.8 ml   Filed Weights   05/29/17 0455 05/30/17 0600 05/31/17 0539  Weight: 38.2 kg (84 lb 3.5 oz) 39.7 kg (87 lb 9.6 oz) 46.3 kg (102 lb)    Examination: General exam: Awake, laying in bed, in nad Respiratory system: Normal respiratory  effort, no wheezing Cardiovascular system: regular rate, s1, s2 Gastrointestinal system: Soft, nondistended, positive BS Central nervous system: CN2-12 grossly intact, strength intact Extremities: Perfused, no clubbing Skin: Normal skin turgor, no notable skin lesions seen Psychiatry: Mood normal // no visual hallucinations   Data Reviewed: I have personally reviewed following labs and imaging studies  CBC:  Recent Labs Lab 05/27/17 0128 05/27/17 0135 05/27/17 1036 05/28/17 0846 05/29/17 0509 05/30/17 0309  WBC 19.8*  --  15.0* 10.8* 11.6* 10.7*  NEUTROABS 18.3*  --   --   --   --   --   HGB 6.7* 6.1* 10.6* 11.3* 12.2 12.0  HCT 20.7* 18.0* 32.3* 35.5* 38.2 37.6  MCV 87.0  --  85.0 85.3 87.0 85.6  PLT 417*  --  306 314 370 253   Basic Metabolic Panel:  Recent Labs Lab 05/27/17 0128 05/27/17 0135 05/27/17 0453 05/28/17 0331 05/29/17 0509 05/30/17 0309  NA 134* 134* 137 138 140 139  K 3.1* 3.1* 3.4* 3.1* 3.8 3.9  CL 100* 99* 102 104 107 107  CO2 24  --  26 25 25 23   GLUCOSE 100* 100* 92 89 82 86  BUN 28* 27* 26* 21* 18 17  CREATININE 1.07* 1.00 1.11* 0.96 0.87 0.91  CALCIUM 8.7*  --  8.5* 8.3* 8.6* 8.8*   GFR: Estimated Creatinine Clearance: 24.5 mL/min (by C-G formula based on SCr of 0.91 mg/dL). Liver Function Tests:  Recent Labs Lab 05/27/17 0128  AST 18  ALT 10*  ALKPHOS 84  BILITOT 0.6  PROT 6.1*  ALBUMIN 3.1*   No results for input(s): LIPASE, AMYLASE in the last 168 hours. No results for input(s): AMMONIA in the last 168 hours. Coagulation Profile: No results for input(s): INR, PROTIME in the last 168 hours. Cardiac Enzymes: No results for input(s): CKTOTAL, CKMB, CKMBINDEX, TROPONINI in the last 168 hours. BNP (last 3 results) No results for input(s): PROBNP in the last 8760 hours. HbA1C: No results for input(s): HGBA1C in the last 72 hours. CBG: No results for input(s): GLUCAP in the last 168 hours. Lipid Profile: No results for input(s):  CHOL, HDL, LDLCALC, TRIG, CHOLHDL, LDLDIRECT in the last 72 hours. Thyroid Function Tests: No results for input(s): TSH, T4TOTAL, FREET4, T3FREE, THYROIDAB in the last 72 hours. Anemia Panel: No results for input(s): VITAMINB12, FOLATE, FERRITIN, TIBC, IRON, RETICCTPCT in the last 72 hours. Sepsis Labs:  Recent Labs Lab 05/27/17 0136 05/27/17 0453 05/27/17 1038  PROCALCITON  --  40.60  --   LATICACIDVEN 0.98  --  0.8    Recent Results (from the past 240 hour(s))  Blood Culture (routine x 2)     Status: None (Preliminary result)   Collection Time: 05/27/17  1:20 AM  Result Value Ref Range Status   Specimen Description BLOOD RIGHT WRIST  Final   Special Requests   Final    BOTTLES DRAWN AEROBIC AND ANAEROBIC Blood Culture adequate volume   Culture NO GROWTH 3 DAYS  Final   Report Status PENDING  Incomplete  Blood Culture (routine x 2)     Status: Abnormal   Collection Time: 05/27/17  1:26 AM  Result Value Ref Range Status   Specimen Description BLOOD RIGHT HAND  Final   Special Requests   Final    BOTTLES DRAWN AEROBIC AND ANAEROBIC Blood Culture adequate volume   Culture  Setup Time   Final    GRAM NEGATIVE RODS ANAEROBIC BOTTLE ONLY CRITICAL RESULT CALLED TO, READ BACK BY AND VERIFIED WITHJerene Bears PHARMD 1934 05/27/17 A BROWNING    Culture ESCHERICHIA COLI (A)  Final   Report Status 05/29/2017 FINAL  Final   Organism ID, Bacteria ESCHERICHIA COLI  Final      Susceptibility   Escherichia coli - MIC*    AMPICILLIN <=2 SENSITIVE Sensitive     CEFAZOLIN <=4 SENSITIVE Sensitive     CEFEPIME <=1 SENSITIVE Sensitive     CEFTAZIDIME <=1 SENSITIVE Sensitive     CEFTRIAXONE <=1 SENSITIVE Sensitive     CIPROFLOXACIN <=0.25 SENSITIVE Sensitive     GENTAMICIN <=1 SENSITIVE Sensitive     IMIPENEM <=0.25 SENSITIVE Sensitive     TRIMETH/SULFA <=20 SENSITIVE Sensitive  AMPICILLIN/SULBACTAM <=2 SENSITIVE Sensitive     PIP/TAZO <=4 SENSITIVE Sensitive     Extended ESBL NEGATIVE  Sensitive     * ESCHERICHIA COLI  Blood Culture ID Panel (Reflexed)     Status: Abnormal   Collection Time: 05/27/17  1:26 AM  Result Value Ref Range Status   Enterococcus species NOT DETECTED NOT DETECTED Final   Listeria monocytogenes NOT DETECTED NOT DETECTED Final   Staphylococcus species NOT DETECTED NOT DETECTED Final   Staphylococcus aureus NOT DETECTED NOT DETECTED Final   Streptococcus species NOT DETECTED NOT DETECTED Final   Streptococcus agalactiae NOT DETECTED NOT DETECTED Final   Streptococcus pneumoniae NOT DETECTED NOT DETECTED Final   Streptococcus pyogenes NOT DETECTED NOT DETECTED Final   Acinetobacter baumannii NOT DETECTED NOT DETECTED Final   Enterobacteriaceae species DETECTED (A) NOT DETECTED Final    Comment: Enterobacteriaceae represent a large family of gram-negative bacteria, not a single organism. CRITICAL RESULT CALLED TO, READ BACK BY AND VERIFIED WITH: Jerene Bears PHARMD 1934 05/27/17 A BROWNING    Enterobacter cloacae complex NOT DETECTED NOT DETECTED Final   Escherichia coli DETECTED (A) NOT DETECTED Final    Comment: CRITICAL RESULT CALLED TO, READ BACK BY AND VERIFIED WITH: Jerene Bears PHARMD 1934 05/27/17 A BROWNING    Klebsiella oxytoca NOT DETECTED NOT DETECTED Final   Klebsiella pneumoniae NOT DETECTED NOT DETECTED Final   Proteus species NOT DETECTED NOT DETECTED Final   Serratia marcescens NOT DETECTED NOT DETECTED Final   Carbapenem resistance NOT DETECTED NOT DETECTED Final   Haemophilus influenzae NOT DETECTED NOT DETECTED Final   Neisseria meningitidis NOT DETECTED NOT DETECTED Final   Pseudomonas aeruginosa NOT DETECTED NOT DETECTED Final   Candida albicans NOT DETECTED NOT DETECTED Final   Candida glabrata NOT DETECTED NOT DETECTED Final   Candida krusei NOT DETECTED NOT DETECTED Final   Candida parapsilosis NOT DETECTED NOT DETECTED Final   Candida tropicalis NOT DETECTED NOT DETECTED Final  MRSA PCR Screening     Status: None   Collection  Time: 05/27/17  7:50 PM  Result Value Ref Range Status   MRSA by PCR NEGATIVE NEGATIVE Final    Comment:        The GeneXpert MRSA Assay (FDA approved for NASAL specimens only), is one component of a comprehensive MRSA colonization surveillance program. It is not intended to diagnose MRSA infection nor to guide or monitor treatment for MRSA infections.   Culture, blood (routine x 2)     Status: None (Preliminary result)   Collection Time: 05/29/17  4:57 PM  Result Value Ref Range Status   Specimen Description BLOOD LEFT HAND  Final   Special Requests IN PEDIATRIC BOTTLE Blood Culture adequate volume  Final   Culture NO GROWTH < 24 HOURS  Final   Report Status PENDING  Incomplete  Culture, blood (routine x 2)     Status: None (Preliminary result)   Collection Time: 05/29/17  4:57 PM  Result Value Ref Range Status   Specimen Description BLOOD RIGHT ARM  Final   Special Requests IN PEDIATRIC BOTTLE Blood Culture adequate volume  Final   Culture NO GROWTH < 24 HOURS  Final   Report Status PENDING  Incomplete     Radiology Studies: No results found.  Scheduled Meds: . acetaminophen  1,000 mg Oral TID  . amiodarone  100 mg Oral Daily  . apixaban  2.5 mg Oral BID  . feeding supplement (ENSURE ENLIVE)  237 mL Oral TID BM  .  torsemide  40 mg Oral BID  . triamcinolone cream   Topical TID   Continuous Infusions: . sodium chloride Stopped (05/27/17 0636)  . ampicillin (OMNIPEN) IV Stopped (05/31/17 1022)  . milrinone 0.125 mcg/kg/min (05/31/17 1200)     LOS: 4 days   CHIU, Orpah Melter, MD [facility] Pager [phone]  If 7PM-7AM, please contact night-coverage [email or web address] Password [email or web address] 05/31/2017, 1:48 PM

## 2017-05-31 NOTE — Progress Notes (Signed)
Pt resting until son, Jacqueline Reilly , brought pt's spouse to visit. Spouse standing outside pt's room yelling at son to get away from him and demanding to wake up pt. Spouse entered pt's room and went to pt's bedside and stumbled and caught by NT. This RN told son that spouse can not stay overnight and that he has to leave right away if he continues to yell and be aggressive. Son states he does not know how to make his father leave. I explained that pt has been getting more rest since spouse left to go home 8/3 and now he is back pt is very anxious and upset. Pt is very confused

## 2017-05-31 NOTE — Progress Notes (Signed)
Was told on shift report from RN that pt refused to have labs drawn this a.m.  Dr Wyline Copas made aware

## 2017-06-01 LAB — CULTURE, BLOOD (ROUTINE X 2)
Culture: NO GROWTH
Special Requests: ADEQUATE

## 2017-06-01 LAB — CBC
HCT: 38.8 % (ref 36.0–46.0)
Hemoglobin: 12.2 g/dL (ref 12.0–15.0)
MCH: 27.1 pg (ref 26.0–34.0)
MCHC: 31.4 g/dL (ref 30.0–36.0)
MCV: 86 fL (ref 78.0–100.0)
Platelets: 429 10*3/uL — ABNORMAL HIGH (ref 150–400)
RBC: 4.51 MIL/uL (ref 3.87–5.11)
RDW: 14 % (ref 11.5–15.5)
WBC: 10 10*3/uL (ref 4.0–10.5)

## 2017-06-01 LAB — BASIC METABOLIC PANEL
Anion gap: 10 (ref 5–15)
BUN: 13 mg/dL (ref 6–20)
CO2: 24 mmol/L (ref 22–32)
Calcium: 8.8 mg/dL — ABNORMAL LOW (ref 8.9–10.3)
Chloride: 105 mmol/L (ref 101–111)
Creatinine, Ser: 1 mg/dL (ref 0.44–1.00)
GFR calc Af Amer: 55 mL/min — ABNORMAL LOW (ref >60)
GFR calc non Af Amer: 48 mL/min — ABNORMAL LOW (ref >60)
Glucose, Bld: 93 mg/dL (ref 65–99)
Potassium: 3.6 mmol/L (ref 3.5–5.1)
Sodium: 139 mmol/L (ref 135–145)

## 2017-06-01 LAB — MAGNESIUM: Magnesium: 2.1 mg/dL (ref 1.7–2.4)

## 2017-06-01 MED ORDER — CEFUROXIME AXETIL 500 MG PO TABS: 500.0000 mg | ORAL_TABLET | ORAL | 0 refills | Status: DC

## 2017-06-01 MED ORDER — SODIUM CHLORIDE 0.9% FLUSH
10.0000 mL | INTRAVENOUS | Status: DC | PRN
  Administered 2017-06-01 – 2017-06-02 (×2): 10 mL
  Filled 2017-06-01 (×2): qty 40

## 2017-06-01 MED ORDER — SODIUM CHLORIDE 0.9% FLUSH
10.0000 mL | Freq: Two times a day (BID) | INTRAVENOUS | Status: DC
Start: 2017-06-01 — End: 2017-06-02

## 2017-06-01 MED ORDER — TORSEMIDE 20 MG PO TABS: 40.0000 mg | ORAL_TABLET | Freq: Two times a day (BID) | ORAL | 0 refills | Status: DC

## 2017-06-01 NOTE — Progress Notes (Signed)
Pt's husband and son are at bedside. Husband yelling at son and screaming for help. Security called. Pt and son were escorted off unit. Pt had some v-tach , Dr Wyline Copas notified.

## 2017-06-01 NOTE — Discharge Summary (Signed)
Physician Discharge Summary  Jacqueline Reilly GQQ:[id number] DOB: [birth year] DOA: 05/27/2017  PCP: [name], MD  Admit date: 05/27/2017 Discharge date: 06/01/2017  Admitted From: Home Disposition:  Home  Recommendations for Outpatient Follow-up:  1. Follow up with PCP in 1-2 weeks 2. Follow up with Cardiology as scheduled 3. Recommend APS on discharge given concerns of living situation  Discharge Condition:Stable CODE STATUS:Full Diet recommendation: Heart healthy   Brief/Interim Summary: 83 y.o.femalewith a PMH of hypertension, hyperlipidemia, atrial fibrillation, chronic systolic CHF with an EF of 25-30 percent on a chronic continuous IV milrinone infusion since 2015, CAD status post balloon angioplasty 2007 and DES 2010, PVD, AAA, RAS who was admitted 05/27/17 for evaluation of fever up to 102F. Of note, 2 months ago she had hip surgery and was doing well with physical therapy. There have not been any reports of incisional pain or signs of infection. Upon initial evaluation in the ED, she was hypotensive with a blood pressure of 86/47, febrile to 102.64F, WBC 19.8, hemoglobin 6.7, lactic acid 0.98. Empirically placed on vancomycin and Zosyn.  Principal Problem: SIRS (systemic inflammatory response syndrome) (HCC)/Probable sepsis with suspected bacteremia - Patient initially febrile, and has known continuous IV access raising the concern for sepsis from bacterial contamination of her IV line.  -PICC has been removed. -Blood cultures pos for enterobacter and pan-sensitive ecoli species. -Patient was initially on broad-spectrum antibiotics for now including vancomycin and Zosyn, patient was later changed to rocephin.  - Cultures confirmed pan-sensitive ecoli and patient was continued on ampicillin - Repeat blood cultures were obtained on 8/2 and have been negative x 48hrs. - PICC placed 8/5  Active Problems: Chronic systolic CHF - EF 67-12 percent. Patient continued on  milrinone and amiodarone.  - Appears compensated at this time - Appreciate input by Cardiology. Recommendation for home milrinone therapy with 24hr supervision. - Discussed case with patient's son, who reports arranging 24hr supervision in anticipation for pending discharge in the next day or two. Patient's son is well aware of patient's and his father's advanced dementia and agrees that long-term placement would be ideal. However, patient's son feels that patient and her husband would feel more at ease at home and thus prefers d/c to home when medically stable. Patient's son states that should home situation prove to be dangerous, that he would arrange long-term placement at that time. - Patient likely has severe dementia and has no insight to her current state of health. Patient's husband also has proven to have no insight to patient's care or ability to make appropriate medical decisions. - Stable at this time  Stage IV chronic kidney disease - Continue to avoid nephrotoxins and monitor creatinine. - currently stable at this time  Atrial fibrillation (HCC) - Heart rate is in the 50s. Blood thinners on hold secondary to significant anemia. - remains rate controlled  Hypokalemia - Given oral supplementation. - Stable at present. Continue to replete as needed  Acute blood loss anemia - Given 1 unit of PRBCs. Fecal occult blood testing was negative, but suspected to have had a diverticular bleed that has resolved. - Hemoglobin has remained stable  Anxiety - Continue BuSpar. - presently stable  Severe protein calorie malnutrition/underweight - Body mass index is 19.42 kg/m. - stable at present  Discharge Diagnoses:  Principal Problem:   SIRS (systemic inflammatory response syndrome) (HCC) Active Problems:   Atrial fibrillation (HCC)   Acute on chronic systolic CHF (congestive heart failure) (HCC)   Hypokalemia   Acute blood  loss anemia   Anxiety   CKD (chronic  kidney disease), stage IV [facility])    Discharge Instructions   Allergies as of 06/01/2017      Reactions   Codeine Nausea Only   Garlic Nausea Only      Medication List    STOP taking these medications   lisinopril 2.5 MG tablet Commonly known as:  PRINIVIL,ZESTRIL   ondansetron 4 MG disintegrating tablet Commonly known as:  ZOFRAN ODT     TAKE these medications   acetaminophen 500 MG tablet Commonly known as:  TYLENOL Take 1,000 mg by mouth 3 (three) times daily.   amiodarone 100 MG tablet Commonly known as:  PACERONE Take 100 mg by mouth daily.   apixaban 2.5 MG Tabs tablet Commonly known as:  ELIQUIS Take 1 tablet (2.5 mg total) by mouth 2 (two) times daily.   busPIRone 5 MG tablet Commonly known as:  BUSPAR Take 5 mg by mouth daily as needed.   cefUROXime 500 MG tablet Commonly known as:  CEFTIN Take 1 tablet (500 mg total) by mouth daily.   Fish Oil 1000 MG Caps Take 1 capsule by mouth 2 (two) times daily.   milrinone 20 MG/100 ML Soln infusion Commonly known as:  PRIMACOR Inject 5.2 mcg/min into the vein continuous. What changed:  how much to take  additional instructions   multivitamin tablet Take 1 tablet by mouth daily.   torsemide 20 MG tablet Commonly known as:  DEMADEX Take 2 tablets (40 mg total) by mouth 2 (two) times daily. What changed:  See the new instructions.   traMADol 50 MG tablet Commonly known as:  ULTRAM Take 25 mg by mouth every 8 (eight) hours as needed for moderate pain.       Allergies  Allergen Reactions  . Codeine Nausea Only  . Garlic Nausea Only    Consultations:  Cardiology  Procedures/Studies: Dg Chest Port 1 View  Result Date: 05/27/2017 CLINICAL DATA:  Fever and left hip pain tonight. EXAM: PORTABLE CHEST 1 VIEW COMPARISON:  04/29/2017 FINDINGS: Shallow inspiration. Cardiac enlargement. Mild interstitial pattern to the lungs may indicate interstitial edema or pneumonitis. Pulmonary vascularity appears  normal. No focal consolidation. No blunting of costophrenic angles. No pneumothorax. Calcified and tortuous aorta. Degenerative changes in the shoulders. Calcifications projected in or around the left shoulder may represent bone lesions such as enchondromas or synovial cartilage consistent with osteochondromatosis. IMPRESSION: Cardiac enlargement. Diffuse mild interstitial pattern to the lungs may represent edema or pneumonitis. Electronically Signed   By: Lucienne Capers M.D.   On: 05/27/2017 01:35   Dg Hip Unilat W Or Wo Pelvis 2-3 Views Left  Result Date: 05/27/2017 CLINICAL DATA:  83 year old female with left hip pain and fever. History of hip replacement. EXAM: DG HIP (WITH OR WITHOUT PELVIS) 2-3V LEFT COMPARISON:  Intraoperative radiograph dated 03/05/2017 FINDINGS: There is a left hip hemiarthroplasty. The orthopedic hardware appears intact. No evidence of hardware loosening. There is no acute fracture or dislocation. The bones are osteopenic. There is osteoarthritic changes of the right hip. The soft tissues appear unremarkable. IMPRESSION: 1. No acute fracture or dislocation. 2. Left hip hemiarthroplasty without complication. Electronically Signed   By: Anner Crete M.D.   On: 05/27/2017 03:12    Subjective: Eager to go home  Discharge Exam: Vitals:   06/01/17 1237 06/01/17 1716  BP: (!) 143/66 126/87  Pulse: 71 65  Resp: 17 (!) 29  Temp: (!) 97.2 F (36.2 C) 97.6 F (36.4 C)  Vitals:   06/01/17 0336 06/01/17 0737 06/01/17 1237 06/01/17 1716  BP:  139/66 (!) 143/66 126/87  Pulse:  82 71 65  Resp:  17 17 (!) 29  Temp:  98 F (36.7 C) (!) 97.2 F (36.2 C) 97.6 F (36.4 C)  TempSrc:  Oral Oral Oral  SpO2:  98% 97% 97%  Weight: 45.8 kg (101 lb)     Height:        General: Pt is alert, awake, not in acute distress Cardiovascular: RRR, S1/S2 +, no rubs, no gallops Respiratory: CTA bilaterally, no wheezing, no rhonchi Abdominal: Soft, NT, ND, bowel sounds + Extremities:  no edema, no cyanosis   The results of significant diagnostics from this hospitalization (including imaging, microbiology, ancillary and laboratory) are listed below for reference.     Microbiology: Recent Results (from the past 240 hour(s))  Blood Culture (routine x 2)     Status: None   Collection Time: 05/27/17  1:20 AM  Result Value Ref Range Status   Specimen Description BLOOD RIGHT WRIST  Final   Special Requests   Final    BOTTLES DRAWN AEROBIC AND ANAEROBIC Blood Culture adequate volume   Culture NO GROWTH 5 DAYS  Final   Report Status 06/01/2017 FINAL  Final  Blood Culture (routine x 2)     Status: Abnormal   Collection Time: 05/27/17  1:26 AM  Result Value Ref Range Status   Specimen Description BLOOD RIGHT HAND  Final   Special Requests   Final    BOTTLES DRAWN AEROBIC AND ANAEROBIC Blood Culture adequate volume   Culture  Setup Time   Final    GRAM NEGATIVE RODS ANAEROBIC BOTTLE ONLY CRITICAL RESULT CALLED TO, READ BACK BY AND VERIFIED WITH: Jerene Bears PHARMD 1934 05/27/17 A BROWNING    Culture ESCHERICHIA COLI (A)  Final   Report Status 05/29/2017 FINAL  Final   Organism ID, Bacteria ESCHERICHIA COLI  Final      Susceptibility   Escherichia coli - MIC*    AMPICILLIN <=2 SENSITIVE Sensitive     CEFAZOLIN <=4 SENSITIVE Sensitive     CEFEPIME <=1 SENSITIVE Sensitive     CEFTAZIDIME <=1 SENSITIVE Sensitive     CEFTRIAXONE <=1 SENSITIVE Sensitive     CIPROFLOXACIN <=0.25 SENSITIVE Sensitive     GENTAMICIN <=1 SENSITIVE Sensitive     IMIPENEM <=0.25 SENSITIVE Sensitive     TRIMETH/SULFA <=20 SENSITIVE Sensitive     AMPICILLIN/SULBACTAM <=2 SENSITIVE Sensitive     PIP/TAZO <=4 SENSITIVE Sensitive     Extended ESBL NEGATIVE Sensitive     * ESCHERICHIA COLI  Blood Culture ID Panel (Reflexed)     Status: Abnormal   Collection Time: 05/27/17  1:26 AM  Result Value Ref Range Status   Enterococcus species NOT DETECTED NOT DETECTED Final   Listeria monocytogenes NOT  DETECTED NOT DETECTED Final   Staphylococcus species NOT DETECTED NOT DETECTED Final   Staphylococcus aureus NOT DETECTED NOT DETECTED Final   Streptococcus species NOT DETECTED NOT DETECTED Final   Streptococcus agalactiae NOT DETECTED NOT DETECTED Final   Streptococcus pneumoniae NOT DETECTED NOT DETECTED Final   Streptococcus pyogenes NOT DETECTED NOT DETECTED Final   Acinetobacter baumannii NOT DETECTED NOT DETECTED Final   Enterobacteriaceae species DETECTED (A) NOT DETECTED Final    Comment: Enterobacteriaceae represent a large family of gram-negative bacteria, not a single organism. CRITICAL RESULT CALLED TO, READ BACK BY AND VERIFIED WITHJerene Bears PHARMD 1934 05/27/17 A BROWNING  Enterobacter cloacae complex NOT DETECTED NOT DETECTED Final   Escherichia coli DETECTED (A) NOT DETECTED Final    Comment: CRITICAL RESULT CALLED TO, READ BACK BY AND VERIFIED WITH: Jerene Bears PHARMD 1934 05/27/17 A BROWNING    Klebsiella oxytoca NOT DETECTED NOT DETECTED Final   Klebsiella pneumoniae NOT DETECTED NOT DETECTED Final   Proteus species NOT DETECTED NOT DETECTED Final   Serratia marcescens NOT DETECTED NOT DETECTED Final   Carbapenem resistance NOT DETECTED NOT DETECTED Final   Haemophilus influenzae NOT DETECTED NOT DETECTED Final   Neisseria meningitidis NOT DETECTED NOT DETECTED Final   Pseudomonas aeruginosa NOT DETECTED NOT DETECTED Final   Candida albicans NOT DETECTED NOT DETECTED Final   Candida glabrata NOT DETECTED NOT DETECTED Final   Candida krusei NOT DETECTED NOT DETECTED Final   Candida parapsilosis NOT DETECTED NOT DETECTED Final   Candida tropicalis NOT DETECTED NOT DETECTED Final  MRSA PCR Screening     Status: None   Collection Time: 05/27/17  7:50 PM  Result Value Ref Range Status   MRSA by PCR NEGATIVE NEGATIVE Final    Comment:        The GeneXpert MRSA Assay (FDA approved for NASAL specimens only), is one component of a comprehensive MRSA  colonization surveillance program. It is not intended to diagnose MRSA infection nor to guide or monitor treatment for MRSA infections.   Culture, blood (routine x 2)     Status: None (Preliminary result)   Collection Time: 05/29/17  4:57 PM  Result Value Ref Range Status   Specimen Description BLOOD LEFT HAND  Final   Special Requests IN PEDIATRIC BOTTLE Blood Culture adequate volume  Final   Culture NO GROWTH 3 DAYS  Final   Report Status PENDING  Incomplete  Culture, blood (routine x 2)     Status: None (Preliminary result)   Collection Time: 05/29/17  4:57 PM  Result Value Ref Range Status   Specimen Description BLOOD RIGHT ARM  Final   Special Requests IN PEDIATRIC BOTTLE Blood Culture adequate volume  Final   Culture NO GROWTH 3 DAYS  Final   Report Status PENDING  Incomplete     Labs: BNP (last 3 results)  Recent Labs  03/04/17 1235 04/04/17 2331 05/27/17 0324  BNP 69.6 412.3* 222.9*   Basic Metabolic Panel:  Recent Labs Lab 05/27/17 0453 05/28/17 0331 05/29/17 0509 05/30/17 0309 06/01/17 0258  NA 137 138 140 139 139  K 3.4* 3.1* 3.8 3.9 3.6  CL 102 104 107 107 105  CO2 26 25 25 23 24   GLUCOSE 92 89 82 86 93  BUN 26* 21* 18 17 13   CREATININE 1.11* 0.96 0.87 0.91 1.00  CALCIUM 8.5* 8.3* 8.6* 8.8* 8.8*  MG  --   --   --   --  2.1   Liver Function Tests:  Recent Labs Lab 05/27/17 0128  AST 18  ALT 10*  ALKPHOS 84  BILITOT 0.6  PROT 6.1*  ALBUMIN 3.1*   No results for input(s): LIPASE, AMYLASE in the last 168 hours. No results for input(s): AMMONIA in the last 168 hours. CBC:  Recent Labs Lab 05/27/17 0128  05/27/17 1036 05/28/17 0846 05/29/17 0509 05/30/17 0309 06/01/17 0258  WBC 19.8*  --  15.0* 10.8* 11.6* 10.7* 10.0  NEUTROABS 18.3*  --   --   --   --   --   --   HGB 6.7*  < > 10.6* 11.3* 12.2 12.0 12.2  HCT 20.7*  < >  32.3* 35.5* 38.2 37.6 38.8  MCV 87.0  --  85.0 85.3 87.0 85.6 86.0  PLT 417*  --  306 314 370 387 429*  < > =  values in this interval not displayed. Cardiac Enzymes: No results for input(s): CKTOTAL, CKMB, CKMBINDEX, TROPONINI in the last 168 hours. BNP: Invalid input(s): POCBNP CBG: No results for input(s): GLUCAP in the last 168 hours. D-Dimer No results for input(s): DDIMER in the last 72 hours. Hgb A1c No results for input(s): HGBA1C in the last 72 hours. Lipid Profile No results for input(s): CHOL, HDL, LDLCALC, TRIG, CHOLHDL, LDLDIRECT in the last 72 hours. Thyroid function studies No results for input(s): TSH, T4TOTAL, T3FREE, THYROIDAB in the last 72 hours.  Invalid input(s): FREET3 Anemia work up No results for input(s): VITAMINB12, FOLATE, FERRITIN, TIBC, IRON, RETICCTPCT in the last 72 hours. Urinalysis    Component Value Date/Time   COLORURINE STRAW (A) 05/27/2017 0052   APPEARANCEUR CLEAR 05/27/2017 0052   LABSPEC 1.006 05/27/2017 0052   PHURINE 6.0 05/27/2017 0052   GLUCOSEU NEGATIVE 05/27/2017 0052   HGBUR MODERATE (A) 05/27/2017 0052   BILIRUBINUR NEGATIVE 05/27/2017 0052   KETONESUR NEGATIVE 05/27/2017 0052   PROTEINUR 30 (A) 05/27/2017 0052   UROBILINOGEN 0.2 09/01/2015 1254   NITRITE NEGATIVE 05/27/2017 0052   LEUKOCYTESUR SMALL (A) 05/27/2017 0052   Sepsis Labs Invalid input(s): PROCALCITONIN,  WBC,  LACTICIDVEN Microbiology Recent Results (from the past 240 hour(s))  Blood Culture (routine x 2)     Status: None   Collection Time: 05/27/17  1:20 AM  Result Value Ref Range Status   Specimen Description BLOOD RIGHT WRIST  Final   Special Requests   Final    BOTTLES DRAWN AEROBIC AND ANAEROBIC Blood Culture adequate volume   Culture NO GROWTH 5 DAYS  Final   Report Status 06/01/2017 FINAL  Final  Blood Culture (routine x 2)     Status: Abnormal   Collection Time: 05/27/17  1:26 AM  Result Value Ref Range Status   Specimen Description BLOOD RIGHT HAND  Final   Special Requests   Final    BOTTLES DRAWN AEROBIC AND ANAEROBIC Blood Culture adequate volume    Culture  Setup Time   Final    GRAM NEGATIVE RODS ANAEROBIC BOTTLE ONLY CRITICAL RESULT CALLED TO, READ BACK BY AND VERIFIED WITH: Jerene Bears PHARMD 1934 05/27/17 A BROWNING    Culture ESCHERICHIA COLI (A)  Final   Report Status 05/29/2017 FINAL  Final   Organism ID, Bacteria ESCHERICHIA COLI  Final      Susceptibility   Escherichia coli - MIC*    AMPICILLIN <=2 SENSITIVE Sensitive     CEFAZOLIN <=4 SENSITIVE Sensitive     CEFEPIME <=1 SENSITIVE Sensitive     CEFTAZIDIME <=1 SENSITIVE Sensitive     CEFTRIAXONE <=1 SENSITIVE Sensitive     CIPROFLOXACIN <=0.25 SENSITIVE Sensitive     GENTAMICIN <=1 SENSITIVE Sensitive     IMIPENEM <=0.25 SENSITIVE Sensitive     TRIMETH/SULFA <=20 SENSITIVE Sensitive     AMPICILLIN/SULBACTAM <=2 SENSITIVE Sensitive     PIP/TAZO <=4 SENSITIVE Sensitive     Extended ESBL NEGATIVE Sensitive     * ESCHERICHIA COLI  Blood Culture ID Panel (Reflexed)     Status: Abnormal   Collection Time: 05/27/17  1:26 AM  Result Value Ref Range Status   Enterococcus species NOT DETECTED NOT DETECTED Final   Listeria monocytogenes NOT DETECTED NOT DETECTED Final   Staphylococcus species NOT DETECTED NOT  DETECTED Final   Staphylococcus aureus NOT DETECTED NOT DETECTED Final   Streptococcus species NOT DETECTED NOT DETECTED Final   Streptococcus agalactiae NOT DETECTED NOT DETECTED Final   Streptococcus pneumoniae NOT DETECTED NOT DETECTED Final   Streptococcus pyogenes NOT DETECTED NOT DETECTED Final   Acinetobacter baumannii NOT DETECTED NOT DETECTED Final   Enterobacteriaceae species DETECTED (A) NOT DETECTED Final    Comment: Enterobacteriaceae represent a large family of gram-negative bacteria, not a single organism. CRITICAL RESULT CALLED TO, READ BACK BY AND VERIFIED WITH: Jerene Bears PHARMD 1934 05/27/17 A BROWNING    Enterobacter cloacae complex NOT DETECTED NOT DETECTED Final   Escherichia coli DETECTED (A) NOT DETECTED Final    Comment: CRITICAL RESULT CALLED TO,  READ BACK BY AND VERIFIED WITH: Jerene Bears PHARMD 1934 05/27/17 A BROWNING    Klebsiella oxytoca NOT DETECTED NOT DETECTED Final   Klebsiella pneumoniae NOT DETECTED NOT DETECTED Final   Proteus species NOT DETECTED NOT DETECTED Final   Serratia marcescens NOT DETECTED NOT DETECTED Final   Carbapenem resistance NOT DETECTED NOT DETECTED Final   Haemophilus influenzae NOT DETECTED NOT DETECTED Final   Neisseria meningitidis NOT DETECTED NOT DETECTED Final   Pseudomonas aeruginosa NOT DETECTED NOT DETECTED Final   Candida albicans NOT DETECTED NOT DETECTED Final   Candida glabrata NOT DETECTED NOT DETECTED Final   Candida krusei NOT DETECTED NOT DETECTED Final   Candida parapsilosis NOT DETECTED NOT DETECTED Final   Candida tropicalis NOT DETECTED NOT DETECTED Final  MRSA PCR Screening     Status: None   Collection Time: 05/27/17  7:50 PM  Result Value Ref Range Status   MRSA by PCR NEGATIVE NEGATIVE Final    Comment:        The GeneXpert MRSA Assay (FDA approved for NASAL specimens only), is one component of a comprehensive MRSA colonization surveillance program. It is not intended to diagnose MRSA infection nor to guide or monitor treatment for MRSA infections.   Culture, blood (routine x 2)     Status: None (Preliminary result)   Collection Time: 05/29/17  4:57 PM  Result Value Ref Range Status   Specimen Description BLOOD LEFT HAND  Final   Special Requests IN PEDIATRIC BOTTLE Blood Culture adequate volume  Final   Culture NO GROWTH 3 DAYS  Final   Report Status PENDING  Incomplete  Culture, blood (routine x 2)     Status: None (Preliminary result)   Collection Time: 05/29/17  4:57 PM  Result Value Ref Range Status   Specimen Description BLOOD RIGHT ARM  Final   Special Requests IN PEDIATRIC BOTTLE Blood Culture adequate volume  Final   Culture NO GROWTH 3 DAYS  Final   Report Status PENDING  Incomplete     SIGNED:   Arlyce Circle, Orpah Melter, MD  [facility] 06/01/2017,  5:46 PM  If 7PM-7AM, please contact night-coverage [email or web address] Password [email or web address]

## 2017-06-01 NOTE — Progress Notes (Signed)
Paged care manager again to notify that picc line has been placed and d/c orders have been written along with milrinone prescription. Waiting for [facility] to come and hook up milrinone

## 2017-06-01 NOTE — Progress Notes (Signed)
Pt awake and moved to the chair. Chair alarm is on , pt has yellow armband and yellow socks on. No sitter available at this time. Pt's door open and chair in the middle of room so good visibility can be kept on pt. Pt only oriented to self.

## 2017-06-01 NOTE — Progress Notes (Signed)
Notified CM that pt is waiting for picc line then will be ready to d/c home with [facility], also let her know that MD printed prescriptions for IV primacor.

## 2017-06-01 NOTE — Treatment Plan (Signed)
Originally planned for discharge today. CM has left for the day. Called [facility] to attempt finalizing home health services. RN attempted to contact [facility] without answer. Given complicated d/c, will hold d/c today and plan d/c tomorrow when CM on service.

## 2017-06-01 NOTE — Progress Notes (Signed)
Received a phone call from patient's son [name] this evening concerning delay in discharge due to the need for coordination with home health for IV milrinone administration. Son is confused as to why [facility] was needed to manage the IV medication, when he states "They have refused to come here due to my father, however we have used their pharmacy".   He gave me the following contact for [facility] and seemed to think that they could manage the patient's milrinone. [phone]. Son reassured that this information would be passed on, and the issue clarified in the morning.

## 2017-06-02 ENCOUNTER — Inpatient Hospital Stay (HOSPITAL_COMMUNITY): Payer: Medicare Other

## 2017-06-02 DIAGNOSIS — M7989 Other specified soft tissue disorders: Secondary | ICD-10-CM

## 2017-06-02 LAB — CBC
HCT: 38.1 % (ref 36.0–46.0)
Hemoglobin: 12 g/dL (ref 12.0–15.0)
MCH: 27.3 pg (ref 26.0–34.0)
MCHC: 31.5 g/dL (ref 30.0–36.0)
MCV: 86.8 fL (ref 78.0–100.0)
Platelets: 439 10*3/uL — ABNORMAL HIGH (ref 150–400)
RBC: 4.39 MIL/uL (ref 3.87–5.11)
RDW: 13.9 % (ref 11.5–15.5)
WBC: 9.6 10*3/uL (ref 4.0–10.5)

## 2017-06-02 LAB — COOXEMETRY PANEL
Carboxyhemoglobin: 0.7 % (ref 0.5–1.5)
Methemoglobin: 1.3 % (ref 0.0–1.5)
O2 Saturation: 69.5 %
Total hemoglobin: 12.5 g/dL (ref 12.0–16.0)

## 2017-06-02 MED ORDER — AMIODARONE HCL 200 MG PO TABS: 200.0000 mg | ORAL_TABLET | Freq: Every day | ORAL | Status: DC

## 2017-06-02 MED ORDER — AMIODARONE HCL 200 MG PO TABS: 200.0000 mg | ORAL_TABLET | Freq: Every day | ORAL | 0 refills | Status: DC

## 2017-06-02 MED ORDER — AMIODARONE HCL 100 MG PO TABS
100.0000 mg | ORAL_TABLET | Freq: Once | ORAL | Status: AC
  Administered 2017-06-02: 100 mg via ORAL
  Filled 2017-06-02: qty 1

## 2017-06-02 NOTE — Progress Notes (Signed)
Patient's son approached RN again regarding patient's twitch in right arm. RN and nursing staff notice no twitch in patient's arm when family is not present. Patient is able to hold an alcohol pad in right hand with no difficulty and with no twitch present. No change in focused neuro assessment. Wyline Copas, MD made aware. RN to discharge patient as planned per MD.

## 2017-06-02 NOTE — Plan of Care (Signed)
Problem: Safety: Goal: Ability to remain free from injury will improve Outcome: Not Progressing Patient remains confused, able to be redirected at times.

## 2017-06-02 NOTE — Progress Notes (Signed)
Discharge note. Patient, son, and husband educated at bedside. RN educated on medications and when to take them, changes in medications, follow up appointments, diet recommendations, and activity restrictions.   Patient discharged on home milrinone through PICC line through [facility].  Patient discharged by wheelchair with NT.

## 2017-06-02 NOTE — Care Management Note (Addendum)
Case Management Note Original note by:  Maryclare Labrador, RN 05/29/2017, 11:39 AM  Patient Details  Name: Jacqueline Reilly MRN: [id number] Date of Birth: 1924-02-07  Subjective/Objective:   Pt admitted with SIRS                   Action/Plan:   PTA from home with husband. CM was unable to engage in meaningful conversation with pt due to constant interruptions from husband at bedside, pt agreed for CM to contact son.  Both pt and husband adamatly denied pt being on IV milrinone in the home - however pt is.  Son states pt will have 24/7 caregiver/supervision at discharge.  Son also relayed safety concerns with father in the home - CSW consulted   Expected Discharge Date:  06/01/17               Expected Discharge Plan:  [place]  In-House Referral:  Clinical Social Work  Discharge planning Services  CM Consult  Post Acute Care Choice:  Home Health, Resumption of Svcs/PTA Provider Choice offered to:  Adult Children  DME Arranged:    DME Agency:     HH Arranged:  RN, PT, OT, Nurse's Aide, Social Work, Theme park manager HH Agency:  [facility]  Status of Service:  In process, will continue to follow  If discussed at Long Length of Stay Meetings, dates discussed:    Additional Comments: 05/30/17 [facility] aware that speech added for [facility]  Pt discussed in rounds this am with CSW present - staff also voiced concerns with interactions witnessed with pt, husband and son.  CM spoke with [facility] and agency will resume services however also voiced concerns with pt discharging back to the home without son.  [facility] provides private duty care assistance 6-8 hours a day in addition to [facility] and SW.  CM contacted both HF team and attending - communicated concerns with pt returning in the home.  Per son his plan is to return to [place] and he will arrange for pt/father to have 24 hour supervision.  HF team continues to follow and will determine if pt is to discharge home on  IV milrinone.  05/30/17 J. Amerson, RN, BSN CM referral for home health needs.  Notified [facility] of possible discharge this weekend, per notes and need for [facility] follow up.  Notified Carolynn Sayers with [facility] that pt will need continuation of milrinone at home.  She will contact heart failure team for orders and coordinate home infusion upon discharge.     06/02/2017 Per attending and HF team pt is safe to discharge home per the plan of 24 hour supervision arranged by son.  Pt has also been deemed safe to discharge home on IV milrinone.  CM contacted both [facility] and [facility] - services will begin at discharge today - agencies informed of pending discharge home today.  Per HF NP pt is not appropriate for [facility] program but will be followed closely post discharge by HF team.  Please see CSW note by Laveda Abbe regarding APS referral. Elenor Quinones, RN, BSN [phone]

## 2017-06-02 NOTE — Progress Notes (Signed)
Patient's family upon arrival noticed a small twitch in patient's right arm. Family expressed their concerns about twitch and their concern that it could possibly be related to the PICC line. RN assessed patient and noticed a small twitch that could not be seen but could be felt in the hand. Patient's son also expressed concern about his mother's weakness. Wyline Copas, MD paged and made aware of situation.   RN will continue to monitor.

## 2017-06-02 NOTE — Progress Notes (Signed)
PROGRESS NOTE    Jacqueline Reilly  XAJ:[id number] DOB: 10/31/1923 DOA: 05/27/2017 PCP: [name], MD    Brief Narrative:  83 y.o. female with a PMH of hypertension, hyperlipidemia, atrial fibrillation, chronic systolic CHF with an EF of 25-30 percent on a chronic continuous IV milrinone infusion since 2015, CAD status post balloon angioplasty 2007 and DES 2010, PVD, AAA, RAS who was admitted 05/27/17 for evaluation of fever up to 102F. Of note, 2 months ago she had hip surgery and was doing well with physical therapy. There have not been any reports of incisional pain or signs of infection. Upon initial evaluation in the ED, she was hypotensive with a blood pressure of 86/47, febrile to 102.45F, WBC 19.8, hemoglobin 6.7, lactic acid 0.98. Empirically placed on vancomycin and Zosyn.  Assessment & Plan:   Principal Problem:   SIRS (systemic inflammatory response syndrome) (HCC) Active Problems:   Atrial fibrillation (HCC)   Acute on chronic systolic CHF (congestive heart failure) (HCC)   Hypokalemia   Acute blood loss anemia   Anxiety   CKD (chronic kidney disease), stage IV (HCC)  Principal Problem: SIRS (systemic inflammatory response syndrome) (HCC)/Probable sepsis with suspected bacteremia - Patient initially febrile, and has known continuous IV access raising the concern for sepsis from bacterial contamination of her IV line.  -PICC has beenremoved. -Blood cultures pos for enterobacter and pan-sensitive ecoli species. -Patient was initially on broad-spectrum antibiotics for now including vancomycin and Zosyn, patient was later changed to rocephin.  - Cultures confirmed pan-sensitive ecoli and patient was continued on ampicillin - Repeat blood cultures were obtained on 8/2 and have been negative x 48hrs. - PICC placed 8/5 - Discharge was held 8/5 secondary to CM leaving for the day  Active Problems: Chronic systolic CHF - EF 09-47 percent. Patient continued on milrinone  and amiodarone.  - Appears compensated at this time - Appreciate input by Cardiology. Recommendation for home milrinone therapy with 24hr supervision. - Discussed case with patient's son, who reports arranging 24hr supervision in anticipation for pending discharge in the next day or two. Patient's son is well aware of patient's and his father's advanced dementia and agrees that long-term placement would be ideal. However, patient's son feels that patient and her husband would feel more at ease at home and thus prefers d/c to home when medically stable. Patient's son states that should home situation prove to be dangerous, that he would arrange long-term placement at that time. - Patient likely has severe dementia and has no insight to her current state of health. Patient's husband also has proven to have no insight to patient's care or ability to make appropriate medical decisions. - Stable at this time  Stage IV chronic kidney disease - Continue to avoid nephrotoxins and monitor creatinine. - currently stable at this time  Atrial fibrillation (HCC) - Heart rate is in the 50s. Blood thinners on hold secondary to significant anemia. - remains rate controlled - Patient noted to have transient episodes of vtach. Discussed with Cardiology who recommended increasing amiodarone from 100mg  to 200mg  daily.  Hypokalemia - Given oral supplementation. - Stable at present. Continue to replete as needed  Acute blood loss anemia - Given 1 unit of PRBCs. Fecal occult blood testing was negative, but suspected to have had a diverticular bleed that has resolved. - Hemoglobin has remained stable  Anxiety - Continue BuSpar. - presently stable  Severe protein calorie malnutrition/underweight - Body mass index is 19.42 kg/m. - stable at present  DVT prophylaxis: SCD's Code Status: Full Family Communication: Pt in room, son at bedside Disposition Plan: Uncertain at this  time  Consultants:   Cardiology  Procedures:     Antimicrobials: Anti-infectives    Start     Dose/Rate Route Frequency Ordered Stop   06/01/17 0000  cefUROXime (CEFTIN) 500 MG tablet     500 mg Oral Every 24 hours 06/01/17 1559     05/30/17 0100  ampicillin (OMNIPEN) 1 g in sodium chloride 0.9 % 50 mL IVPB     1 g 150 mL/hr over 20 Minutes Intravenous Every 8 hours 05/29/17 2043     05/29/17 1800  ampicillin (OMNIPEN) 1 g in sodium chloride 0.9 % 50 mL IVPB  Status:  Discontinued     1 g 150 mL/hr over 20 Minutes Intravenous Every 6 hours 05/29/17 1611 05/29/17 2043   05/28/17 0200  vancomycin (VANCOCIN) 500 mg in sodium chloride 0.9 % 100 mL IVPB  Status:  Discontinued     500 mg 100 mL/hr over 60 Minutes Intravenous Every 24 hours 05/27/17 1109 05/27/17 1948   05/27/17 2100  cefTRIAXone (ROCEPHIN) 2 g in dextrose 5 % 50 mL IVPB  Status:  Discontinued     2 g 100 mL/hr over 30 Minutes Intravenous Every 24 hours 05/27/17 1948 05/29/17 1611   05/27/17 1200  piperacillin-tazobactam (ZOSYN) IVPB 2.25 g  Status:  Discontinued     2.25 g 100 mL/hr over 30 Minutes Intravenous Every 6 hours 05/27/17 1109 05/27/17 1948   05/27/17 0100  piperacillin-tazobactam (ZOSYN) IVPB 3.375 g     3.375 g 100 mL/hr over 30 Minutes Intravenous  Once 05/27/17 0052 05/27/17 0231   05/27/17 0100  vancomycin (VANCOCIN) IVPB 1000 mg/200 mL premix     1,000 mg 200 mL/hr over 60 Minutes Intravenous  Once 05/27/17 0052 05/27/17 0301      Subjective: No complaints this AM  Objective: Vitals:   06/02/17 0100 06/02/17 0113 06/02/17 0408 06/02/17 0749  BP:  (!) 97/50 140/80 (!) 146/73  Pulse: 65 67  64  Resp: (!) 24 (!) 23  (!) 26  Temp:   97.8 F (36.6 C) 97.7 F (36.5 C)  TempSrc:    Oral  SpO2: 98% 93%  95%  Weight:   43.1 kg (95 lb)   Height:        Intake/Output Summary (Last 24 hours) at 06/02/17 0832 Last data filed at 06/02/17 0804  Gross per 24 hour  Intake          1174.57 ml   Output              425 ml  Net           749.57 ml   Filed Weights   05/31/17 0539 06/01/17 0336 06/02/17 0408  Weight: 46.3 kg (102 lb) 45.8 kg (101 lb) 43.1 kg (95 lb)    Examination: General exam: conversant, laying in bed Respiratory system: Normal respiratory effort, no wheezing Cardiovascular system: regular rate, s1, s2  Data Reviewed: I have personally reviewed following labs and imaging studies  CBC:  Recent Labs Lab 05/27/17 0128  05/28/17 0846 05/29/17 0509 05/30/17 0309 06/01/17 0258 06/02/17 0400  WBC 19.8*  < > 10.8* 11.6* 10.7* 10.0 9.6  NEUTROABS 18.3*  --   --   --   --   --   --   HGB 6.7*  < > 11.3* 12.2 12.0 12.2 12.0  HCT 20.7*  < > 35.5* 38.2  37.6 38.8 38.1  MCV 87.0  < > 85.3 87.0 85.6 86.0 86.8  PLT 417*  < > 314 370 387 429* 439*  < > = values in this interval not displayed. Basic Metabolic Panel:  Recent Labs Lab 05/27/17 0453 05/28/17 0331 05/29/17 0509 05/30/17 0309 06/01/17 0258  NA 137 138 140 139 139  K 3.4* 3.1* 3.8 3.9 3.6  CL 102 104 107 107 105  CO2 26 25 25 23 24   GLUCOSE 92 89 82 86 93  BUN 26* 21* 18 17 13   CREATININE 1.11* 0.96 0.87 0.91 1.00  CALCIUM 8.5* 8.3* 8.6* 8.8* 8.8*  MG  --   --   --   --  2.1   GFR: Estimated Creatinine Clearance: 22.3 mL/min (by C-G formula based on SCr of 1 mg/dL). Liver Function Tests:  Recent Labs Lab 05/27/17 0128  AST 18  ALT 10*  ALKPHOS 84  BILITOT 0.6  PROT 6.1*  ALBUMIN 3.1*   No results for input(s): LIPASE, AMYLASE in the last 168 hours. No results for input(s): AMMONIA in the last 168 hours. Coagulation Profile: No results for input(s): INR, PROTIME in the last 168 hours. Cardiac Enzymes: No results for input(s): CKTOTAL, CKMB, CKMBINDEX, TROPONINI in the last 168 hours. BNP (last 3 results) No results for input(s): PROBNP in the last 8760 hours. HbA1C: No results for input(s): HGBA1C in the last 72 hours. CBG: No results for input(s): GLUCAP in the last 168  hours. Lipid Profile: No results for input(s): CHOL, HDL, LDLCALC, TRIG, CHOLHDL, LDLDIRECT in the last 72 hours. Thyroid Function Tests: No results for input(s): TSH, T4TOTAL, FREET4, T3FREE, THYROIDAB in the last 72 hours. Anemia Panel: No results for input(s): VITAMINB12, FOLATE, FERRITIN, TIBC, IRON, RETICCTPCT in the last 72 hours. Sepsis Labs:  Recent Labs Lab 05/27/17 0136 05/27/17 0453 05/27/17 1038  PROCALCITON  --  40.60  --   LATICACIDVEN 0.98  --  0.8    Recent Results (from the past 240 hour(s))  Blood Culture (routine x 2)     Status: None   Collection Time: 05/27/17  1:20 AM  Result Value Ref Range Status   Specimen Description BLOOD RIGHT WRIST  Final   Special Requests   Final    BOTTLES DRAWN AEROBIC AND ANAEROBIC Blood Culture adequate volume   Culture NO GROWTH 5 DAYS  Final   Report Status 06/01/2017 FINAL  Final  Blood Culture (routine x 2)     Status: Abnormal   Collection Time: 05/27/17  1:26 AM  Result Value Ref Range Status   Specimen Description BLOOD RIGHT HAND  Final   Special Requests   Final    BOTTLES DRAWN AEROBIC AND ANAEROBIC Blood Culture adequate volume   Culture  Setup Time   Final    GRAM NEGATIVE RODS ANAEROBIC BOTTLE ONLY CRITICAL RESULT CALLED TO, READ BACK BY AND VERIFIED WITHJerene Bears PHARMD 1934 05/27/17 A BROWNING    Culture ESCHERICHIA COLI (A)  Final   Report Status 05/29/2017 FINAL  Final   Organism ID, Bacteria ESCHERICHIA COLI  Final      Susceptibility   Escherichia coli - MIC*    AMPICILLIN <=2 SENSITIVE Sensitive     CEFAZOLIN <=4 SENSITIVE Sensitive     CEFEPIME <=1 SENSITIVE Sensitive     CEFTAZIDIME <=1 SENSITIVE Sensitive     CEFTRIAXONE <=1 SENSITIVE Sensitive     CIPROFLOXACIN <=0.25 SENSITIVE Sensitive     GENTAMICIN <=1 SENSITIVE Sensitive  IMIPENEM <=0.25 SENSITIVE Sensitive     TRIMETH/SULFA <=20 SENSITIVE Sensitive     AMPICILLIN/SULBACTAM <=2 SENSITIVE Sensitive     PIP/TAZO <=4 SENSITIVE  Sensitive     Extended ESBL NEGATIVE Sensitive     * ESCHERICHIA COLI  Blood Culture ID Panel (Reflexed)     Status: Abnormal   Collection Time: 05/27/17  1:26 AM  Result Value Ref Range Status   Enterococcus species NOT DETECTED NOT DETECTED Final   Listeria monocytogenes NOT DETECTED NOT DETECTED Final   Staphylococcus species NOT DETECTED NOT DETECTED Final   Staphylococcus aureus NOT DETECTED NOT DETECTED Final   Streptococcus species NOT DETECTED NOT DETECTED Final   Streptococcus agalactiae NOT DETECTED NOT DETECTED Final   Streptococcus pneumoniae NOT DETECTED NOT DETECTED Final   Streptococcus pyogenes NOT DETECTED NOT DETECTED Final   Acinetobacter baumannii NOT DETECTED NOT DETECTED Final   Enterobacteriaceae species DETECTED (A) NOT DETECTED Final    Comment: Enterobacteriaceae represent a large family of gram-negative bacteria, not a single organism. CRITICAL RESULT CALLED TO, READ BACK BY AND VERIFIED WITH: Jerene Bears PHARMD 1934 05/27/17 A BROWNING    Enterobacter cloacae complex NOT DETECTED NOT DETECTED Final   Escherichia coli DETECTED (A) NOT DETECTED Final    Comment: CRITICAL RESULT CALLED TO, READ BACK BY AND VERIFIED WITH: Jerene Bears PHARMD 1934 05/27/17 A BROWNING    Klebsiella oxytoca NOT DETECTED NOT DETECTED Final   Klebsiella pneumoniae NOT DETECTED NOT DETECTED Final   Proteus species NOT DETECTED NOT DETECTED Final   Serratia marcescens NOT DETECTED NOT DETECTED Final   Carbapenem resistance NOT DETECTED NOT DETECTED Final   Haemophilus influenzae NOT DETECTED NOT DETECTED Final   Neisseria meningitidis NOT DETECTED NOT DETECTED Final   Pseudomonas aeruginosa NOT DETECTED NOT DETECTED Final   Candida albicans NOT DETECTED NOT DETECTED Final   Candida glabrata NOT DETECTED NOT DETECTED Final   Candida krusei NOT DETECTED NOT DETECTED Final   Candida parapsilosis NOT DETECTED NOT DETECTED Final   Candida tropicalis NOT DETECTED NOT DETECTED Final  MRSA PCR  Screening     Status: None   Collection Time: 05/27/17  7:50 PM  Result Value Ref Range Status   MRSA by PCR NEGATIVE NEGATIVE Final    Comment:        The GeneXpert MRSA Assay (FDA approved for NASAL specimens only), is one component of a comprehensive MRSA colonization surveillance program. It is not intended to diagnose MRSA infection nor to guide or monitor treatment for MRSA infections.   Culture, blood (routine x 2)     Status: None (Preliminary result)   Collection Time: 05/29/17  4:57 PM  Result Value Ref Range Status   Specimen Description BLOOD LEFT HAND  Final   Special Requests IN PEDIATRIC BOTTLE Blood Culture adequate volume  Final   Culture NO GROWTH 3 DAYS  Final   Report Status PENDING  Incomplete  Culture, blood (routine x 2)     Status: None (Preliminary result)   Collection Time: 05/29/17  4:57 PM  Result Value Ref Range Status   Specimen Description BLOOD RIGHT ARM  Final   Special Requests IN PEDIATRIC BOTTLE Blood Culture adequate volume  Final   Culture NO GROWTH 3 DAYS  Final   Report Status PENDING  Incomplete     Radiology Studies: No results found.  Scheduled Meds: . acetaminophen  1,000 mg Oral TID  . amiodarone  100 mg Oral Daily  . apixaban  2.5 mg Oral  BID  . feeding supplement (ENSURE ENLIVE)  237 mL Oral TID BM  . sodium chloride flush  10-40 mL Intracatheter Q12H  . torsemide  40 mg Oral BID  . triamcinolone cream   Topical TID   Continuous Infusions: . sodium chloride Stopped (05/27/17 0636)  . ampicillin (OMNIPEN) IV 1 g (06/02/17 0827)  . milrinone 0.125 mcg/kg/min (06/01/17 1817)     LOS: 6 days   CHIU, Orpah Melter, MD [facility] Pager [phone]  If 7PM-7AM, please contact night-coverage [email or web address] Password [email or web address] 06/02/2017, 8:32 AM

## 2017-06-02 NOTE — Progress Notes (Signed)
CSW covering received return phone call from [place] Worker with update on decision from report made by CSW [place] Scrape. APS Case Worker indicated that the report was screened out because the patient's son is actively working to provide 24/7 care for the patient at discharge, as well as petitioning for guardianship of both the patient and the patient's husband. APS Case Worker indicated that as long as patient's son stays involved and provides care for both the patient and the patient's husband, the state would not need to get involved; call and report again should the son decide that he can no longer remain in the picture as a primary caregiver.  CSW noting that patient will be DC home with 24/7 care by the patient's son as well as home health services. CSW signing off.  Laveda Abbe, [place] Clinical Social Worker [phone]

## 2017-06-02 NOTE — Progress Notes (Signed)
*  PRELIMINARY RESULTS* Vascular Ultrasound Right upper extremity venous duplex has been completed.  Preliminary findings: no evidence of DVT in visualized veins (unable to image area under PICC bandages). Superficial thrombosis noted in the right cephalic vein at forearm.    Landry Mellow, RDMS, RVT  06/02/2017, 10:36 AM

## 2017-06-02 NOTE — Progress Notes (Signed)
Patient previously had 6 beat run of Jule Economy, MD was made aware. Patient has now had a second run of [place]. Amiodarone PO was increased at time of notification to Wyline Copas, MD. RN will continue to monitor.

## 2017-06-03 DIAGNOSIS — I481 Persistent atrial fibrillation: Secondary | ICD-10-CM | POA: Diagnosis not present

## 2017-06-03 DIAGNOSIS — S32302D Unspecified fracture of left ilium, subsequent encounter for fracture with routine healing: Secondary | ICD-10-CM | POA: Diagnosis not present

## 2017-06-03 DIAGNOSIS — I5042 Chronic combined systolic (congestive) and diastolic (congestive) heart failure: Secondary | ICD-10-CM | POA: Diagnosis not present

## 2017-06-03 DIAGNOSIS — I739 Peripheral vascular disease, unspecified: Secondary | ICD-10-CM | POA: Diagnosis not present

## 2017-06-03 DIAGNOSIS — J9690 Respiratory failure, unspecified, unspecified whether with hypoxia or hypercapnia: Secondary | ICD-10-CM | POA: Diagnosis not present

## 2017-06-03 DIAGNOSIS — I110 Hypertensive heart disease with heart failure: Secondary | ICD-10-CM | POA: Diagnosis not present

## 2017-06-03 LAB — CULTURE, BLOOD (ROUTINE X 2)
Culture: NO GROWTH
Culture: NO GROWTH
Special Requests: ADEQUATE
Special Requests: ADEQUATE

## 2017-06-05 DIAGNOSIS — I5020 Unspecified systolic (congestive) heart failure: Secondary | ICD-10-CM | POA: Diagnosis not present

## 2017-06-05 DIAGNOSIS — D649 Anemia, unspecified: Secondary | ICD-10-CM | POA: Diagnosis not present

## 2017-06-05 DIAGNOSIS — A419 Sepsis, unspecified organism: Secondary | ICD-10-CM | POA: Diagnosis not present

## 2017-06-09 DIAGNOSIS — I110 Hypertensive heart disease with heart failure: Secondary | ICD-10-CM | POA: Diagnosis not present

## 2017-06-09 DIAGNOSIS — S32302D Unspecified fracture of left ilium, subsequent encounter for fracture with routine healing: Secondary | ICD-10-CM | POA: Diagnosis not present

## 2017-06-09 DIAGNOSIS — I739 Peripheral vascular disease, unspecified: Secondary | ICD-10-CM | POA: Diagnosis not present

## 2017-06-09 DIAGNOSIS — I5042 Chronic combined systolic (congestive) and diastolic (congestive) heart failure: Secondary | ICD-10-CM | POA: Diagnosis not present

## 2017-06-09 DIAGNOSIS — J9690 Respiratory failure, unspecified, unspecified whether with hypoxia or hypercapnia: Secondary | ICD-10-CM | POA: Diagnosis not present

## 2017-06-09 DIAGNOSIS — I481 Persistent atrial fibrillation: Secondary | ICD-10-CM | POA: Diagnosis not present

## 2017-06-11 ENCOUNTER — Ambulatory Visit (INDEPENDENT_AMBULATORY_CARE_PROVIDER_SITE_OTHER): Payer: Medicare Other | Admitting: Podiatry

## 2017-06-11 ENCOUNTER — Encounter: Payer: Self-pay | Admitting: Podiatry

## 2017-06-11 ENCOUNTER — Ambulatory Visit: Payer: Medicare Other | Admitting: Podiatry

## 2017-06-11 VITALS — BP 131/76 | HR 74

## 2017-06-11 DIAGNOSIS — I2583 Coronary atherosclerosis due to lipid rich plaque: Secondary | ICD-10-CM | POA: Diagnosis not present

## 2017-06-11 DIAGNOSIS — L84 Corns and callosities: Secondary | ICD-10-CM

## 2017-06-11 DIAGNOSIS — I2510 Atherosclerotic heart disease of native coronary artery without angina pectoris: Secondary | ICD-10-CM | POA: Diagnosis not present

## 2017-06-11 DIAGNOSIS — B351 Tinea unguium: Secondary | ICD-10-CM

## 2017-06-11 DIAGNOSIS — J9690 Respiratory failure, unspecified, unspecified whether with hypoxia or hypercapnia: Secondary | ICD-10-CM | POA: Diagnosis not present

## 2017-06-11 DIAGNOSIS — I739 Peripheral vascular disease, unspecified: Secondary | ICD-10-CM | POA: Diagnosis not present

## 2017-06-11 DIAGNOSIS — I481 Persistent atrial fibrillation: Secondary | ICD-10-CM | POA: Diagnosis not present

## 2017-06-11 DIAGNOSIS — I5042 Chronic combined systolic (congestive) and diastolic (congestive) heart failure: Secondary | ICD-10-CM | POA: Diagnosis not present

## 2017-06-11 DIAGNOSIS — M79676 Pain in unspecified toe(s): Secondary | ICD-10-CM

## 2017-06-11 DIAGNOSIS — S32302D Unspecified fracture of left ilium, subsequent encounter for fracture with routine healing: Secondary | ICD-10-CM | POA: Diagnosis not present

## 2017-06-11 DIAGNOSIS — I110 Hypertensive heart disease with heart failure: Secondary | ICD-10-CM | POA: Diagnosis not present

## 2017-06-11 NOTE — Progress Notes (Addendum)
   Subjective:    Patient ID: Jacqueline Reilly, female    DOB: 1924-06-16, 83 y.o.   MRN: [id number]  HPI this patient presents to the office saying that all of her toes are extremely painful, especially on her right foot.  She says she has been painful for years.  She presents to the office with her guardian and her son.  Her son says she attempted to trim the nails, but was unsuccessful since she was experiencing pain when he worked on her right foot.  She says that the nails are painful walking and wearing her shoes.  She presents the office today for definitive evaluation and treatment of her feet. She walks with a surgical shoe to keep weight off of her painful toes on her right foot.    Review of Systems  Eyes: Positive for redness.  Skin:       rash  Hematological: Bruises/bleeds easily.  Psychiatric/Behavioral: Positive for confusion.       Objective:   Physical Exam GENERAL APPEARANCE: Alert, conversant. Appropriately groomed. No acute distress.  VASCULAR: Pedal pulses are barely   palpable at  [facility] and PT bilateral.  Capillary refill time is immediate to all digits,  Cold feet. NEUROLOGIC: sensation is normal to 5.07 monofilament at 5/5 sites bilateral.  Light touch is intact bilateral, Muscle strength normal.  MUSCULOSKELETAL: acceptable muscle strength, tone and stability bilateral.  HAV 1st MPJ and tailors bunion  B/L. NAILS  thick disfigured discolored nails with subungual debris.  No evidence of bacterial infection or drainage DERMATOLOGIC: skin color, texture, and turgor are within normal limits.  No preulcerative lesions or ulcers  are seen, no interdigital maceration noted.  No open lesions present.  Distal clavi 3rd toe right foot.. No drainage noted.         Assessment & Plan:  Onychomycosis  B/L  Clavi 3rd toe right  IE  Debride nails  Debride clavi.  RTC 3 months.   Gardiner Barefoot DPM

## 2017-06-12 DIAGNOSIS — I5042 Chronic combined systolic (congestive) and diastolic (congestive) heart failure: Secondary | ICD-10-CM | POA: Diagnosis not present

## 2017-06-12 DIAGNOSIS — I481 Persistent atrial fibrillation: Secondary | ICD-10-CM | POA: Diagnosis not present

## 2017-06-12 DIAGNOSIS — J9690 Respiratory failure, unspecified, unspecified whether with hypoxia or hypercapnia: Secondary | ICD-10-CM | POA: Diagnosis not present

## 2017-06-12 DIAGNOSIS — I739 Peripheral vascular disease, unspecified: Secondary | ICD-10-CM | POA: Diagnosis not present

## 2017-06-12 DIAGNOSIS — I110 Hypertensive heart disease with heart failure: Secondary | ICD-10-CM | POA: Diagnosis not present

## 2017-06-12 DIAGNOSIS — S32302D Unspecified fracture of left ilium, subsequent encounter for fracture with routine healing: Secondary | ICD-10-CM | POA: Diagnosis not present

## 2017-06-13 DIAGNOSIS — L853 Xerosis cutis: Secondary | ICD-10-CM | POA: Diagnosis not present

## 2017-06-13 DIAGNOSIS — R21 Rash and other nonspecific skin eruption: Secondary | ICD-10-CM | POA: Diagnosis not present

## 2017-06-14 ENCOUNTER — Encounter (HOSPITAL_COMMUNITY): Payer: Self-pay

## 2017-06-14 ENCOUNTER — Emergency Department (HOSPITAL_COMMUNITY)
Admission: EM | Admit: 2017-06-14 | Discharge: 2017-06-14 | Disposition: A | Discharge status: Home / Self Care | Payer: Medicare Other | Attending: Emergency Medicine | Admitting: Emergency Medicine

## 2017-06-14 DIAGNOSIS — A09 Infectious gastroenteritis and colitis, unspecified: Secondary | ICD-10-CM | POA: Diagnosis not present

## 2017-06-14 DIAGNOSIS — Z79899 Other long term (current) drug therapy: Secondary | ICD-10-CM | POA: Insufficient documentation

## 2017-06-14 DIAGNOSIS — N3001 Acute cystitis with hematuria: Secondary | ICD-10-CM | POA: Insufficient documentation

## 2017-06-14 DIAGNOSIS — E876 Hypokalemia: Secondary | ICD-10-CM | POA: Diagnosis not present

## 2017-06-14 DIAGNOSIS — Z955 Presence of coronary angioplasty implant and graft: Secondary | ICD-10-CM | POA: Diagnosis not present

## 2017-06-14 DIAGNOSIS — I130 Hypertensive heart and chronic kidney disease with heart failure and stage 1 through stage 4 chronic kidney disease, or unspecified chronic kidney disease: Secondary | ICD-10-CM | POA: Diagnosis not present

## 2017-06-14 DIAGNOSIS — I5023 Acute on chronic systolic (congestive) heart failure: Secondary | ICD-10-CM | POA: Insufficient documentation

## 2017-06-14 DIAGNOSIS — R21 Rash and other nonspecific skin eruption: Secondary | ICD-10-CM | POA: Diagnosis not present

## 2017-06-14 DIAGNOSIS — Z96642 Presence of left artificial hip joint: Secondary | ICD-10-CM | POA: Insufficient documentation

## 2017-06-14 DIAGNOSIS — R197 Diarrhea, unspecified: Secondary | ICD-10-CM

## 2017-06-14 DIAGNOSIS — N938 Other specified abnormal uterine and vaginal bleeding: Secondary | ICD-10-CM | POA: Diagnosis present

## 2017-06-14 DIAGNOSIS — I2510 Atherosclerotic heart disease of native coronary artery without angina pectoris: Secondary | ICD-10-CM | POA: Insufficient documentation

## 2017-06-14 DIAGNOSIS — N184 Chronic kidney disease, stage 4 (severe): Secondary | ICD-10-CM | POA: Diagnosis not present

## 2017-06-14 LAB — CBC WITH DIFFERENTIAL/PLATELET
Basophils Absolute: 0 10*3/uL (ref 0.0–0.1)
Basophils Relative: 0 %
Eosinophils Absolute: 0.1 10*3/uL (ref 0.0–0.7)
Eosinophils Relative: 2 %
HCT: 36.9 % (ref 36.0–46.0)
Hemoglobin: 11.8 g/dL — ABNORMAL LOW (ref 12.0–15.0)
Lymphocytes Relative: 17 %
Lymphs Abs: 1.4 10*3/uL (ref 0.7–4.0)
MCH: 27.8 pg (ref 26.0–34.0)
MCHC: 32 g/dL (ref 30.0–36.0)
MCV: 86.8 fL (ref 78.0–100.0)
Monocytes Absolute: 0.6 10*3/uL (ref 0.1–1.0)
Monocytes Relative: 8 %
Neutro Abs: 5.8 10*3/uL (ref 1.7–7.7)
Neutrophils Relative %: 73 %
Platelets: 379 10*3/uL (ref 150–400)
RBC: 4.25 MIL/uL (ref 3.87–5.11)
RDW: 15.2 % (ref 11.5–15.5)
WBC: 7.9 10*3/uL (ref 4.0–10.5)

## 2017-06-14 LAB — COMPREHENSIVE METABOLIC PANEL
ALT: 12 U/L — ABNORMAL LOW (ref 14–54)
AST: 25 U/L (ref 15–41)
Albumin: 3.6 g/dL (ref 3.5–5.0)
Alkaline Phosphatase: 83 U/L (ref 38–126)
Anion gap: 11 (ref 5–15)
BUN: 27 mg/dL — ABNORMAL HIGH (ref 6–20)
CO2: 30 mmol/L (ref 22–32)
Calcium: 8.9 mg/dL (ref 8.9–10.3)
Chloride: 96 mmol/L — ABNORMAL LOW (ref 101–111)
Creatinine, Ser: 0.91 mg/dL (ref 0.44–1.00)
GFR calc Af Amer: >60 mL/min (ref >60)
GFR calc non Af Amer: 54 mL/min — ABNORMAL LOW (ref >60)
Glucose, Bld: 96 mg/dL (ref 65–99)
Potassium: 3.1 mmol/L — ABNORMAL LOW (ref 3.5–5.1)
Sodium: 137 mmol/L (ref 135–145)
Total Bilirubin: 0.4 mg/dL (ref 0.3–1.2)
Total Protein: 6.8 g/dL (ref 6.5–8.1)

## 2017-06-14 LAB — URINALYSIS, ROUTINE W REFLEX MICROSCOPIC
Bilirubin Urine: NEGATIVE
Glucose, UA: NEGATIVE mg/dL
Ketones, ur: NEGATIVE mg/dL
Nitrite: NEGATIVE
Protein, ur: NEGATIVE mg/dL
Specific Gravity, Urine: 1.006 (ref 1.005–1.030)
pH: 6 (ref 5.0–8.0)

## 2017-06-14 LAB — POC OCCULT BLOOD, ED: Fecal Occult Bld: POSITIVE — AB

## 2017-06-14 LAB — PROTIME-INR
INR: 1.2
Prothrombin Time: 15.3 seconds — ABNORMAL HIGH (ref 11.4–15.2)

## 2017-06-14 LAB — TYPE AND SCREEN
ABO/RH(D): O NEG
Antibody Screen: NEGATIVE

## 2017-06-14 LAB — ABO/RH: ABO/RH(D): O NEG

## 2017-06-14 MED ORDER — POTASSIUM CHLORIDE 20 MEQ/15ML (10%) PO SOLN
20.0000 meq | Freq: Every day | ORAL | Status: DC
  Administered 2017-06-14: 20 meq via ORAL
  Filled 2017-06-14: qty 15

## 2017-06-14 MED ORDER — CEPHALEXIN 250 MG PO CAPS: 250.0000 mg | ORAL_CAPSULE | Freq: Three times a day (TID) | ORAL | 0 refills | Status: AC

## 2017-06-14 MED ORDER — DIPHENHYDRAMINE HCL 25 MG PO CAPS
25.0000 mg | ORAL_CAPSULE | Freq: Once | ORAL | Status: AC
  Administered 2017-06-14: 25 mg via ORAL
  Filled 2017-06-14: qty 1

## 2017-06-14 MED ORDER — ZINC OXIDE 11.3 % EX CREA
TOPICAL_CREAM | Freq: Two times a day (BID) | CUTANEOUS | Status: DC
  Administered 2017-06-14: 1 via TOPICAL
  Filled 2017-06-14: qty 56

## 2017-06-14 MED ORDER — SODIUM CHLORIDE 0.9 % IV SOLN
INTRAVENOUS | Status: DC
  Administered 2017-06-14: 19:00:00 via INTRAVENOUS

## 2017-06-14 MED ORDER — CEPHALEXIN 250 MG PO CAPS
250.0000 mg | ORAL_CAPSULE | Freq: Once | ORAL | Status: AC
  Administered 2017-06-14: 250 mg via ORAL
  Filled 2017-06-14: qty 1

## 2017-06-14 NOTE — ED Triage Notes (Signed)
Patient's caretaker was with the patient and she c/o burning sensation of the vaginal area and then she had a BM. The caretaker states that the patient had a BM and was wiping stool towards the front and patient had bright red blood dripping from the vaginal area x 3. Caretaker reports that the patient recently was treated for a UTI.

## 2017-06-14 NOTE — ED Provider Notes (Signed)
[facility] DEPT Provider Note   CSN: [id number] Arrival date & time: 06/14/17  1541     History   Chief Complaint Bleeding from either the rectum or the vaginal area  HPI Jacqueline Reilly is a 83 y.o. female.  HPI  Patient presents to the emergency room for evaluation of bleeding.  Recently in the hospital for sirs sepsis related to line infection. Patient has a history of dementia. She has a caregiver that assists her. Patient has been having frequent stools in the last couple of days but not necessarily diarrhea. She had one episode of nausea and vomiting yesterday. The patient had a bowel movement earlier today. When the caregiver went to help clean her up the patient had been wiping the stool from her rectal area towards her vaginal area. The caregiver noted there was some blood in the vaginal area and she was not sure if it was from the stool or coming from the vagina. She was also not sure if she was urinating blood because the patient recently treated for UTI. Patient had another bowel movement in the day in the same circumstances recurred. She was brought into the emergency room for further evaluation.   Past Medical History:  Diagnosis Date  . AAA (abdominal aortic aneurysm) ([place])   . Atrial fibrillation ([place])   . Chronic systolic heart failure ([place])    a. ECHO (04/2014) EF 20-25%, diff HK, mild MR  . Coronary artery disease 2007   moderate ASCAD of the left system s/p PCI of the D2 and PCI of the left circ 03/2009  . Hyperlipidemia   . Hypertension   . PVD (peripheral vascular disease) ([place])   . Renal artery stenosis ([place])   . Ventricular dysfunction    left; ischemic    Patient Active Problem List   Diagnosis Date Noted  . SIRS (systemic inflammatory response syndrome) ([place]) 05/27/2017  . Acute blood loss anemia 05/27/2017  . Anxiety 05/27/2017  . CKD (chronic kidney disease), stage IV ([place]) 05/27/2017  . Heart failure with reduced ejection fraction ([place]) 04/05/2017   . Receiving inotropic medication 04/05/2017  . Hypokalemia 04/05/2017  . Malnutrition of moderate degree 03/05/2017  . Left displaced femoral neck fracture ([place]) 03/04/2017  . Leukocytosis 03/04/2017  . Hypoxia   . Gram-negative bacteremia 09/01/2015  . Chronic anemia 09/01/2015  . Persistent atrial fibrillation ([place])   . Acute on chronic systolic CHF (congestive heart failure) ([place]) 06/21/2015  . Atrial fibrillation ([place]) 08/10/2014  . Physical deconditioning 05/26/2014  . Acute respiratory failure with hypoxia ([place]) 05/08/2014  . NSTEMI, initial episode of care ([place]) 05/07/2014  . Atrial fibrillation with rapid ventricular response ([place]) 05/07/2014  . NSTEMI (non-ST elevated myocardial infarction) ([place]) 05/07/2014  . Abnormal chest x-ray 04/24/2014  . CHF (congestive heart failure) ([place]) 04/23/2014  . Acute on chronic combined systolic and diastolic congestive heart failure ([place]) 04/23/2014  . AAA (abdominal aortic aneurysm) ([place]) 10/07/2013  . SOB (shortness of breath) 09/30/2013  . Coronary artery disease   . Ischemic dilated cardiomyopathy ([place])   . Hypertension     Past Surgical History:  Procedure Laterality Date  . ANGIOPLASTY    . ANTERIOR APPROACH HEMI HIP ARTHROPLASTY Left 03/05/2017   Procedure: ANTERIOR APPROACH LEFT HIP HEMI ARTHROPLASTY;  Surgeon: Leandrew Koyanagi, MD;  Location: [facility];  Service: Orthopedics;  Laterality: Left;  . CORONARY STENT PLACEMENT    . IR GENERIC HISTORICAL  09/25/2016   IR FLUORO GUIDE CV LINE RIGHT 09/25/2016  Ardis Rowan, PA-C [facility]  . retinal cryopexy     right eye (for retinal detachment)    OB History    No data available       Home Medications    Prior to Admission medications   Medication Sig Start Date End Date Taking? Authorizing Provider  acetaminophen (TYLENOL) 500 MG tablet Take 1,000 mg by mouth 3 (three) times daily.    [provider]  amiodarone (PACERONE) 200 MG tablet Take 1 tablet (200 mg  total) by mouth daily. 06/03/17   Donne Hazel, MD  apixaban (ELIQUIS) 2.5 MG TABS tablet Take 1 tablet (2.5 mg total) by mouth 2 (two) times daily. 11/22/16   Bensimhon, Shaune Pascal, MD  busPIRone (BUSPAR) 5 MG tablet Take 5 mg by mouth daily as needed.    [provider]  cefUROXime (CEFTIN) 500 MG tablet Take 1 tablet (500 mg total) by mouth daily. 06/01/17   Donne Hazel, MD  cephALEXin (KEFLEX) 250 MG capsule Take 1 capsule (250 mg total) by mouth 3 (three) times daily. 06/14/17 06/21/17  Dorie Rank, MD  milrinone (PRIMACOR) 20 MG/100 ML SOLN infusion Inject 5.2 mcg/min into the vein continuous. 05/30/17   Donne Hazel, MD  Multiple Vitamin (MULTIVITAMIN) tablet Take 1 tablet by mouth daily. 07/26/14   Larey Dresser, MD  Omega-3 Fatty Acids (FISH OIL) 1000 MG CAPS Take 1 capsule by mouth 2 (two) times daily.     [provider]  torsemide (DEMADEX) 20 MG tablet Take 2 tablets (40 mg total) by mouth 2 (two) times daily. 06/01/17   Donne Hazel, MD  traMADol (ULTRAM) 50 MG tablet Take 25 mg by mouth every 8 (eight) hours as needed for moderate pain.    [provider]    Family History Family History  Problem Relation Age of Onset  . Heart disease Mother   . Heart disease Father   . Heart disease Brother        x 3    Social History Social History  Substance Use Topics  . Smoking status: Never Smoker  . Smokeless tobacco: Never Used  . Alcohol use Yes     Comment: occasionally     Allergies   Codeine and Garlic   Review of Systems Review of Systems  All other systems reviewed and are negative.    Physical Exam Updated Vital Signs BP (!) 134/92 (BP Location: Left Arm)   Pulse 72   Temp 98.3 F (36.8 C) (Oral)   Resp 14   Ht 1.448 m (4\' 9" )   Wt 43.1 kg (95 lb)   SpO2 97%   BMI 20.56 kg/m   Physical Exam  Constitutional: No distress.  Elderly frail  HENT:  Head: Normocephalic and atraumatic.  Right Ear: External ear normal.  Left  Ear: External ear normal.  Eyes: Conjunctivae are normal. Right eye exhibits no discharge. Left eye exhibits no discharge. No scleral icterus.  Neck: Neck supple. No tracheal deviation present.  Cardiovascular: Normal rate, regular rhythm and intact distal pulses.   Pulmonary/Chest: Effort normal and breath sounds normal. No stridor. No respiratory distress. She has no wheezes. She has no rales.  Abdominal: Soft. Bowel sounds are normal. She exhibits no distension. There is no tenderness. There is no rebound and no guarding.  Genitourinary:  Genitourinary Comments: Erythematous rash in the perineum around the perinal and vaginal area, no discharge;   Musculoskeletal: She exhibits no edema or tenderness.  Neurological: She is alert. She has normal strength. No cranial nerve deficit (no facial droop, extraocular movements intact, no slurred speech) or sensory deficit. She exhibits normal muscle tone. She displays no seizure activity. Coordination normal.  Skin: Skin is warm and dry. No rash noted.  Psychiatric: She has a normal mood and affect.  Nursing note and vitals reviewed.    ED Treatments / Results  Labs (all labs ordered are listed, but only abnormal results are displayed) Labs Reviewed  URINALYSIS, ROUTINE W REFLEX MICROSCOPIC - Abnormal; Notable for the following:       Result Value   Hgb urine dipstick LARGE (*)    Leukocytes, UA MODERATE (*)    Bacteria, UA FEW (*)    Squamous Epithelial / LPF 0-5 (*)    All other components within normal limits  CBC WITH DIFFERENTIAL/PLATELET - Abnormal; Notable for the following:    Hemoglobin 11.8 (*)    All other components within normal limits  COMPREHENSIVE METABOLIC PANEL - Abnormal; Notable for the following:    Potassium 3.1 (*)    Chloride 96 (*)    BUN 27 (*)    ALT 12 (*)    GFR calc non Af Amer 54 (*)    All other components within normal limits  PROTIME-INR - Abnormal; Notable for the following:    Prothrombin Time 15.3  (*)    All other components within normal limits  POC OCCULT BLOOD, ED - Abnormal; Notable for the following:    Fecal Occult Bld POSITIVE (*)    All other components within normal limits  C DIFFICILE QUICK SCREEN W PCR REFLEX  URINE CULTURE  TYPE AND SCREEN  ABO/RH      Radiology No results found.  Procedures Procedures (including critical care time)  Medications Ordered in ED Medications  0.9 %  sodium chloride infusion ( Intravenous New Bag/Given 06/14/17 1848)  zinc oxide (BALMEX) 16.1 % cream (1 application Topical Given 06/14/17 1839)  potassium chloride 20 MEQ/15ML (10%) solution 20 mEq (not administered)  cephALEXin (KEFLEX) capsule 250 mg (not administered)  diphenhydrAMINE (BENADRYL) capsule 25 mg (25 mg Oral Given 06/14/17 1837)     Initial Impression / Assessment and Plan / ED Course  I have reviewed the triage vital signs and the nursing notes.  Pertinent labs & imaging results that were available during my care of the patient were reviewed by me and considered in my medical decision making (see chart for details).     Clean catch urie specimen does not show a lot of contamination.  Pt does have hematuria.  Stool was guiac positive but no gross blood.  Labs are otherwise normal.  No anemia.  No sign of significant rectal bleeding.  Somewhat difficult to assess the exact source of blood in that she does have a lot of vaginal irritation and the UA does suggest a UTI.    Discussed findings with family.  She has been on abx recently and has been having frequent loose stools.  I ordered a c diff but pt has not been able to provide a stool specimen.  Will add on the test for her to provide if she is able at discharge.  Patient was given cream to apply to her perineum. Her caregiver will continue to apply that at home.  Labs are stable.  She does not want to be in the hospital.  Doubt significant GI bleed requiring hospitalization.  Will treat for UTI and add on c  diff  Final Clinical Impressions(s) / ED Diagnoses   Final diagnoses:  Acute cystitis with hematuria  Diarrhea of presumed infectious origin  Rash of perineum    New Prescriptions New Prescriptions   CEPHALEXIN (KEFLEX) 250 MG CAPSULE    Take 1 capsule (250 mg total) by mouth 3 (three) times daily.     Dorie Rank, MD 06/14/17 [id number]

## 2017-06-14 NOTE — Discharge Instructions (Signed)
Take the antibiotics for the urinary tract infection. If you can provide a stool sample please bring that into the lab. Follow-up with your primary care doctor later this week as we discussed. Return to the emergency room for fever or worsening symptoms.

## 2017-06-14 NOTE — ED Notes (Signed)
Bed: [facility]1 Expected date:  Expected time:  Means of arrival:  Comments: 

## 2017-06-16 DIAGNOSIS — J9690 Respiratory failure, unspecified, unspecified whether with hypoxia or hypercapnia: Secondary | ICD-10-CM | POA: Diagnosis not present

## 2017-06-16 DIAGNOSIS — I5042 Chronic combined systolic (congestive) and diastolic (congestive) heart failure: Secondary | ICD-10-CM | POA: Diagnosis not present

## 2017-06-16 DIAGNOSIS — S32302D Unspecified fracture of left ilium, subsequent encounter for fracture with routine healing: Secondary | ICD-10-CM | POA: Diagnosis not present

## 2017-06-16 DIAGNOSIS — I481 Persistent atrial fibrillation: Secondary | ICD-10-CM | POA: Diagnosis not present

## 2017-06-16 DIAGNOSIS — I110 Hypertensive heart disease with heart failure: Secondary | ICD-10-CM | POA: Diagnosis not present

## 2017-06-16 DIAGNOSIS — I739 Peripheral vascular disease, unspecified: Secondary | ICD-10-CM | POA: Diagnosis not present

## 2017-06-17 LAB — URINE CULTURE: Culture: >=100000 — AB

## 2017-06-18 ENCOUNTER — Telehealth: Payer: Self-pay | Admitting: Emergency Medicine

## 2017-06-18 NOTE — Telephone Encounter (Signed)
Post ED Visit - Positive Culture Follow-up: Successful Patient Follow-Up  Culture assessed and recommendations reviewed by: []  Elenor Quinones, Pharm.D. []  Heide Guile, Pharm.D., BCPS AQ-ID []  Parks Neptune, Pharm.D., BCPS []  Alycia Rossetti, Pharm.D., BCPS []  [place], Pharm.D., BCPS, AAHIVP []  Legrand Como, Pharm.D., BCPS, AAHIVP []  Salome Arnt, PharmD, BCPS []  Dimitri Ped, PharmD, BCPS []  Vincenza Hews, PharmD, BCPS Nida Boatman PharmD  Positive urine culture  []  Patient discharged without antimicrobial prescription and treatment is now indicated []  Organism is resistant to prescribed ED discharge antimicrobial []  Patient with positive blood cultures  Changes discussed with ED provider: Lenn Sink PA New antibiotic prescription if + symptoms, start ciprofloxacin 250mg  po q 18 hours x 3 days, 8pm, 2pm, 8 am  Attempting to contact patient  Hazle Nordmann 06/18/2017, 4:07 PM

## 2017-06-18 NOTE — Progress Notes (Signed)
ED Antimicrobial Stewardship Positive Culture Follow Up   Jacqueline Reilly is an 83 y.o. female who presented to [facility] on 06/14/2017 with a chief complaint of  Chief Complaint  Patient presents with  . Vaginal Bleeding    Recent Results (from the past 720 hour(s))  Blood Culture (routine x 2)     Status: None   Collection Time: 05/27/17  1:20 AM  Result Value Ref Range Status   Specimen Description BLOOD RIGHT WRIST  Final   Special Requests   Final    BOTTLES DRAWN AEROBIC AND ANAEROBIC Blood Culture adequate volume   Culture NO GROWTH 5 DAYS  Final   Report Status 06/01/2017 FINAL  Final  Blood Culture (routine x 2)     Status: Abnormal   Collection Time: 05/27/17  1:26 AM  Result Value Ref Range Status   Specimen Description BLOOD RIGHT HAND  Final   Special Requests   Final    BOTTLES DRAWN AEROBIC AND ANAEROBIC Blood Culture adequate volume   Culture  Setup Time   Final    GRAM NEGATIVE RODS ANAEROBIC BOTTLE ONLY CRITICAL RESULT CALLED TO, READ BACK BY AND VERIFIED WITH: Jerene Bears PHARMD 1934 05/27/17 A BROWNING    Culture ESCHERICHIA COLI (A)  Final   Report Status 05/29/2017 FINAL  Final   Organism ID, Bacteria ESCHERICHIA COLI  Final      Susceptibility   Escherichia coli - MIC*    AMPICILLIN <=2 SENSITIVE Sensitive     CEFAZOLIN <=4 SENSITIVE Sensitive     CEFEPIME <=1 SENSITIVE Sensitive     CEFTAZIDIME <=1 SENSITIVE Sensitive     CEFTRIAXONE <=1 SENSITIVE Sensitive     CIPROFLOXACIN <=0.25 SENSITIVE Sensitive     GENTAMICIN <=1 SENSITIVE Sensitive     IMIPENEM <=0.25 SENSITIVE Sensitive     TRIMETH/SULFA <=20 SENSITIVE Sensitive     AMPICILLIN/SULBACTAM <=2 SENSITIVE Sensitive     PIP/TAZO <=4 SENSITIVE Sensitive     Extended ESBL NEGATIVE Sensitive     * ESCHERICHIA COLI  Blood Culture ID Panel (Reflexed)     Status: Abnormal   Collection Time: 05/27/17  1:26 AM  Result Value Ref Range Status   Enterococcus species NOT DETECTED NOT DETECTED Final   Listeria monocytogenes NOT DETECTED NOT DETECTED Final   Staphylococcus species NOT DETECTED NOT DETECTED Final   Staphylococcus aureus NOT DETECTED NOT DETECTED Final   Streptococcus species NOT DETECTED NOT DETECTED Final   Streptococcus agalactiae NOT DETECTED NOT DETECTED Final   Streptococcus pneumoniae NOT DETECTED NOT DETECTED Final   Streptococcus pyogenes NOT DETECTED NOT DETECTED Final   Acinetobacter baumannii NOT DETECTED NOT DETECTED Final   Enterobacteriaceae species DETECTED (A) NOT DETECTED Final    Comment: Enterobacteriaceae represent a large family of gram-negative bacteria, not a single organism. CRITICAL RESULT CALLED TO, READ BACK BY AND VERIFIED WITH: H BAIRD PHARMD 1934 05/27/17 A BROWNING    Enterobacter cloacae complex NOT DETECTED NOT DETECTED Final   Escherichia coli DETECTED (A) NOT DETECTED Final    Comment: CRITICAL RESULT CALLED TO, READ BACK BY AND VERIFIED WITH: Jerene Bears PHARMD 1934 05/27/17 A BROWNING    Klebsiella oxytoca NOT DETECTED NOT DETECTED Final   Klebsiella pneumoniae NOT DETECTED NOT DETECTED Final   Proteus species NOT DETECTED NOT DETECTED Final   Serratia marcescens NOT DETECTED NOT DETECTED Final   Carbapenem resistance NOT DETECTED NOT DETECTED Final   Haemophilus influenzae NOT DETECTED NOT DETECTED Final   Neisseria meningitidis NOT  DETECTED NOT DETECTED Final   Pseudomonas aeruginosa NOT DETECTED NOT DETECTED Final   Candida albicans NOT DETECTED NOT DETECTED Final   Candida glabrata NOT DETECTED NOT DETECTED Final   Candida krusei NOT DETECTED NOT DETECTED Final   Candida parapsilosis NOT DETECTED NOT DETECTED Final   Candida tropicalis NOT DETECTED NOT DETECTED Final  MRSA PCR Screening     Status: None   Collection Time: 05/27/17  7:50 PM  Result Value Ref Range Status   MRSA by PCR NEGATIVE NEGATIVE Final    Comment:        The GeneXpert MRSA Assay (FDA approved for NASAL specimens only), is one component of a comprehensive  MRSA colonization surveillance program. It is not intended to diagnose MRSA infection nor to guide or monitor treatment for MRSA infections.   Culture, blood (routine x 2)     Status: None   Collection Time: 05/29/17  4:57 PM  Result Value Ref Range Status   Specimen Description BLOOD LEFT HAND  Final   Special Requests IN PEDIATRIC BOTTLE Blood Culture adequate volume  Final   Culture NO GROWTH 5 DAYS  Final   Report Status 06/03/2017 FINAL  Final  Culture, blood (routine x 2)     Status: None   Collection Time: 05/29/17  4:57 PM  Result Value Ref Range Status   Specimen Description BLOOD RIGHT ARM  Final   Special Requests IN PEDIATRIC BOTTLE Blood Culture adequate volume  Final   Culture NO GROWTH 5 DAYS  Final   Report Status 06/03/2017 FINAL  Final  Urine Culture     Status: Abnormal   Collection Time: 06/14/17  7:19 PM  Result Value Ref Range Status   Specimen Description URINE, RANDOM  Final   Special Requests NONE  Final   Culture >=100,000 COLONIES/mL PSEUDOMONAS AERUGINOSA (A)  Final   Report Status 06/17/2017 FINAL  Final   Organism ID, Bacteria PSEUDOMONAS AERUGINOSA (A)  Final      Susceptibility   Pseudomonas aeruginosa - MIC*    CEFTAZIDIME 4 SENSITIVE Sensitive     CIPROFLOXACIN <=0.25 SENSITIVE Sensitive     GENTAMICIN <=1 SENSITIVE Sensitive     IMIPENEM 1 SENSITIVE Sensitive     PIP/TAZO 8 SENSITIVE Sensitive     CEFEPIME 2 SENSITIVE Sensitive     * >=100,000 COLONIES/mL PSEUDOMONAS AERUGINOSA    [x]  Treated with cephalexin, organism resistant to prescribed antimicrobial []  Patient discharged originally without antimicrobial agent and treatment is now indicated  Patient's renal function is ~27.  Plan: Call patient or talk to family members to determine if they want to treat with an antibiotic. There is a risk of diarrhea and potential GI infections after a course of antibiotics, but if she is having a symptomatic UTI, then the Dr. Geraldine Solar to treat with a  fluoroquinolone. Monitor closely given elderly patient and poor renal function.   New antibiotic prescription: Ciprofloxacin 250mg  PO q18h x 3 days.  ED Provider: Lenn Sink, PA-C  Nida Boatman, PharmD PGY1 Acute Care Pharmacy Resident Pager: [phone] 06/18/2017 10:30 AM

## 2017-06-19 DIAGNOSIS — S32302D Unspecified fracture of left ilium, subsequent encounter for fracture with routine healing: Secondary | ICD-10-CM | POA: Diagnosis not present

## 2017-06-19 DIAGNOSIS — I739 Peripheral vascular disease, unspecified: Secondary | ICD-10-CM | POA: Diagnosis not present

## 2017-06-19 DIAGNOSIS — I481 Persistent atrial fibrillation: Secondary | ICD-10-CM | POA: Diagnosis not present

## 2017-06-19 DIAGNOSIS — I5042 Chronic combined systolic (congestive) and diastolic (congestive) heart failure: Secondary | ICD-10-CM | POA: Diagnosis not present

## 2017-06-19 DIAGNOSIS — I110 Hypertensive heart disease with heart failure: Secondary | ICD-10-CM | POA: Diagnosis not present

## 2017-06-19 DIAGNOSIS — J9690 Respiratory failure, unspecified, unspecified whether with hypoxia or hypercapnia: Secondary | ICD-10-CM | POA: Diagnosis not present

## 2017-06-21 DIAGNOSIS — J9690 Respiratory failure, unspecified, unspecified whether with hypoxia or hypercapnia: Secondary | ICD-10-CM | POA: Diagnosis not present

## 2017-06-21 DIAGNOSIS — I130 Hypertensive heart and chronic kidney disease with heart failure and stage 1 through stage 4 chronic kidney disease, or unspecified chronic kidney disease: Secondary | ICD-10-CM | POA: Diagnosis not present

## 2017-06-21 DIAGNOSIS — Z9181 History of falling: Secondary | ICD-10-CM | POA: Diagnosis not present

## 2017-06-21 DIAGNOSIS — I252 Old myocardial infarction: Secondary | ICD-10-CM | POA: Diagnosis not present

## 2017-06-21 DIAGNOSIS — F0390 Unspecified dementia without behavioral disturbance: Secondary | ICD-10-CM | POA: Diagnosis not present

## 2017-06-21 DIAGNOSIS — F419 Anxiety disorder, unspecified: Secondary | ICD-10-CM | POA: Diagnosis not present

## 2017-06-21 DIAGNOSIS — E782 Mixed hyperlipidemia: Secondary | ICD-10-CM | POA: Diagnosis not present

## 2017-06-21 DIAGNOSIS — E43 Unspecified severe protein-calorie malnutrition: Secondary | ICD-10-CM | POA: Diagnosis not present

## 2017-06-21 DIAGNOSIS — I5042 Chronic combined systolic (congestive) and diastolic (congestive) heart failure: Secondary | ICD-10-CM | POA: Diagnosis not present

## 2017-06-21 DIAGNOSIS — D631 Anemia in chronic kidney disease: Secondary | ICD-10-CM | POA: Diagnosis not present

## 2017-06-21 DIAGNOSIS — I2510 Atherosclerotic heart disease of native coronary artery without angina pectoris: Secondary | ICD-10-CM | POA: Diagnosis not present

## 2017-06-21 DIAGNOSIS — I739 Peripheral vascular disease, unspecified: Secondary | ICD-10-CM | POA: Diagnosis not present

## 2017-06-21 DIAGNOSIS — I481 Persistent atrial fibrillation: Secondary | ICD-10-CM | POA: Diagnosis not present

## 2017-06-21 DIAGNOSIS — Z452 Encounter for adjustment and management of vascular access device: Secondary | ICD-10-CM | POA: Diagnosis not present

## 2017-06-21 DIAGNOSIS — N184 Chronic kidney disease, stage 4 (severe): Secondary | ICD-10-CM | POA: Diagnosis not present

## 2017-06-21 DIAGNOSIS — Z96642 Presence of left artificial hip joint: Secondary | ICD-10-CM | POA: Diagnosis not present

## 2017-06-21 DIAGNOSIS — Z7901 Long term (current) use of anticoagulants: Secondary | ICD-10-CM | POA: Diagnosis not present

## 2017-06-21 DIAGNOSIS — Z9981 Dependence on supplemental oxygen: Secondary | ICD-10-CM | POA: Diagnosis not present

## 2017-06-21 DIAGNOSIS — S32302D Unspecified fracture of left ilium, subsequent encounter for fracture with routine healing: Secondary | ICD-10-CM | POA: Diagnosis not present

## 2017-06-21 DIAGNOSIS — I420 Dilated cardiomyopathy: Secondary | ICD-10-CM | POA: Diagnosis not present

## 2017-06-23 DIAGNOSIS — D631 Anemia in chronic kidney disease: Secondary | ICD-10-CM | POA: Diagnosis not present

## 2017-06-23 DIAGNOSIS — S32302D Unspecified fracture of left ilium, subsequent encounter for fracture with routine healing: Secondary | ICD-10-CM | POA: Diagnosis not present

## 2017-06-23 DIAGNOSIS — I5042 Chronic combined systolic (congestive) and diastolic (congestive) heart failure: Secondary | ICD-10-CM | POA: Diagnosis not present

## 2017-06-23 DIAGNOSIS — I130 Hypertensive heart and chronic kidney disease with heart failure and stage 1 through stage 4 chronic kidney disease, or unspecified chronic kidney disease: Secondary | ICD-10-CM | POA: Diagnosis not present

## 2017-06-23 DIAGNOSIS — I481 Persistent atrial fibrillation: Secondary | ICD-10-CM | POA: Diagnosis not present

## 2017-06-23 DIAGNOSIS — N184 Chronic kidney disease, stage 4 (severe): Secondary | ICD-10-CM | POA: Diagnosis not present

## 2017-06-24 ENCOUNTER — Encounter (HOSPITAL_COMMUNITY): Payer: Medicare Other | Admitting: Internal Medicine

## 2017-06-30 ENCOUNTER — Other Ambulatory Visit: Payer: Self-pay | Admitting: Internal Medicine

## 2017-06-30 DIAGNOSIS — I5042 Chronic combined systolic (congestive) and diastolic (congestive) heart failure: Secondary | ICD-10-CM | POA: Diagnosis not present

## 2017-06-30 DIAGNOSIS — I130 Hypertensive heart and chronic kidney disease with heart failure and stage 1 through stage 4 chronic kidney disease, or unspecified chronic kidney disease: Secondary | ICD-10-CM | POA: Diagnosis not present

## 2017-06-30 DIAGNOSIS — S32302D Unspecified fracture of left ilium, subsequent encounter for fracture with routine healing: Secondary | ICD-10-CM | POA: Diagnosis not present

## 2017-06-30 DIAGNOSIS — N184 Chronic kidney disease, stage 4 (severe): Secondary | ICD-10-CM | POA: Diagnosis not present

## 2017-06-30 DIAGNOSIS — D631 Anemia in chronic kidney disease: Secondary | ICD-10-CM | POA: Diagnosis not present

## 2017-06-30 DIAGNOSIS — I481 Persistent atrial fibrillation: Secondary | ICD-10-CM | POA: Diagnosis not present

## 2017-07-02 ENCOUNTER — Telehealth: Payer: Self-pay | Admitting: Emergency Medicine

## 2017-07-02 DIAGNOSIS — S32302D Unspecified fracture of left ilium, subsequent encounter for fracture with routine healing: Secondary | ICD-10-CM | POA: Diagnosis not present

## 2017-07-02 DIAGNOSIS — D631 Anemia in chronic kidney disease: Secondary | ICD-10-CM | POA: Diagnosis not present

## 2017-07-02 DIAGNOSIS — I130 Hypertensive heart and chronic kidney disease with heart failure and stage 1 through stage 4 chronic kidney disease, or unspecified chronic kidney disease: Secondary | ICD-10-CM | POA: Diagnosis not present

## 2017-07-02 DIAGNOSIS — I481 Persistent atrial fibrillation: Secondary | ICD-10-CM | POA: Diagnosis not present

## 2017-07-02 DIAGNOSIS — I5042 Chronic combined systolic (congestive) and diastolic (congestive) heart failure: Secondary | ICD-10-CM | POA: Diagnosis not present

## 2017-07-02 DIAGNOSIS — N184 Chronic kidney disease, stage 4 (severe): Secondary | ICD-10-CM | POA: Diagnosis not present

## 2017-07-07 DIAGNOSIS — N184 Chronic kidney disease, stage 4 (severe): Secondary | ICD-10-CM | POA: Diagnosis not present

## 2017-07-07 DIAGNOSIS — I5042 Chronic combined systolic (congestive) and diastolic (congestive) heart failure: Secondary | ICD-10-CM | POA: Diagnosis not present

## 2017-07-07 DIAGNOSIS — I130 Hypertensive heart and chronic kidney disease with heart failure and stage 1 through stage 4 chronic kidney disease, or unspecified chronic kidney disease: Secondary | ICD-10-CM | POA: Diagnosis not present

## 2017-07-07 DIAGNOSIS — S32302D Unspecified fracture of left ilium, subsequent encounter for fracture with routine healing: Secondary | ICD-10-CM | POA: Diagnosis not present

## 2017-07-07 DIAGNOSIS — D631 Anemia in chronic kidney disease: Secondary | ICD-10-CM | POA: Diagnosis not present

## 2017-07-07 DIAGNOSIS — I481 Persistent atrial fibrillation: Secondary | ICD-10-CM | POA: Diagnosis not present

## 2017-07-09 DIAGNOSIS — I5042 Chronic combined systolic (congestive) and diastolic (congestive) heart failure: Secondary | ICD-10-CM | POA: Diagnosis not present

## 2017-07-09 DIAGNOSIS — D631 Anemia in chronic kidney disease: Secondary | ICD-10-CM | POA: Diagnosis not present

## 2017-07-09 DIAGNOSIS — S32302D Unspecified fracture of left ilium, subsequent encounter for fracture with routine healing: Secondary | ICD-10-CM | POA: Diagnosis not present

## 2017-07-09 DIAGNOSIS — I481 Persistent atrial fibrillation: Secondary | ICD-10-CM | POA: Diagnosis not present

## 2017-07-09 DIAGNOSIS — N184 Chronic kidney disease, stage 4 (severe): Secondary | ICD-10-CM | POA: Diagnosis not present

## 2017-07-09 DIAGNOSIS — I130 Hypertensive heart and chronic kidney disease with heart failure and stage 1 through stage 4 chronic kidney disease, or unspecified chronic kidney disease: Secondary | ICD-10-CM | POA: Diagnosis not present

## 2017-07-14 DIAGNOSIS — S32302D Unspecified fracture of left ilium, subsequent encounter for fracture with routine healing: Secondary | ICD-10-CM | POA: Diagnosis not present

## 2017-07-14 DIAGNOSIS — I481 Persistent atrial fibrillation: Secondary | ICD-10-CM | POA: Diagnosis not present

## 2017-07-14 DIAGNOSIS — D631 Anemia in chronic kidney disease: Secondary | ICD-10-CM | POA: Diagnosis not present

## 2017-07-14 DIAGNOSIS — N184 Chronic kidney disease, stage 4 (severe): Secondary | ICD-10-CM | POA: Diagnosis not present

## 2017-07-14 DIAGNOSIS — I130 Hypertensive heart and chronic kidney disease with heart failure and stage 1 through stage 4 chronic kidney disease, or unspecified chronic kidney disease: Secondary | ICD-10-CM | POA: Diagnosis not present

## 2017-07-14 DIAGNOSIS — I5042 Chronic combined systolic (congestive) and diastolic (congestive) heart failure: Secondary | ICD-10-CM | POA: Diagnosis not present

## 2017-07-17 ENCOUNTER — Ambulatory Visit (HOSPITAL_COMMUNITY)
Admission: RE | Admit: 2017-07-17 | Discharge: 2017-07-17 | Disposition: A | Discharge status: Home / Self Care | Payer: Medicare Other | Source: Ambulatory Visit | Attending: Internal Medicine | Admitting: Internal Medicine

## 2017-07-17 VITALS — BP 138/78 | HR 72 | Wt 89.5 lb

## 2017-07-17 DIAGNOSIS — Z79891 Long term (current) use of opiate analgesic: Secondary | ICD-10-CM | POA: Insufficient documentation

## 2017-07-17 DIAGNOSIS — F0390 Unspecified dementia without behavioral disturbance: Secondary | ICD-10-CM | POA: Insufficient documentation

## 2017-07-17 DIAGNOSIS — I429 Cardiomyopathy, unspecified: Secondary | ICD-10-CM | POA: Insufficient documentation

## 2017-07-17 DIAGNOSIS — I2510 Atherosclerotic heart disease of native coronary artery without angina pectoris: Secondary | ICD-10-CM | POA: Diagnosis not present

## 2017-07-17 DIAGNOSIS — I5022 Chronic systolic (congestive) heart failure: Secondary | ICD-10-CM | POA: Diagnosis not present

## 2017-07-17 DIAGNOSIS — Z79899 Other long term (current) drug therapy: Secondary | ICD-10-CM | POA: Diagnosis not present

## 2017-07-17 DIAGNOSIS — E785 Hyperlipidemia, unspecified: Secondary | ICD-10-CM | POA: Diagnosis not present

## 2017-07-17 DIAGNOSIS — Z8249 Family history of ischemic heart disease and other diseases of the circulatory system: Secondary | ICD-10-CM | POA: Insufficient documentation

## 2017-07-17 DIAGNOSIS — Z7901 Long term (current) use of anticoagulants: Secondary | ICD-10-CM | POA: Insufficient documentation

## 2017-07-17 DIAGNOSIS — I714 Abdominal aortic aneurysm, without rupture: Secondary | ICD-10-CM | POA: Diagnosis not present

## 2017-07-17 DIAGNOSIS — I739 Peripheral vascular disease, unspecified: Secondary | ICD-10-CM | POA: Insufficient documentation

## 2017-07-17 DIAGNOSIS — I480 Paroxysmal atrial fibrillation: Secondary | ICD-10-CM | POA: Insufficient documentation

## 2017-07-17 DIAGNOSIS — I110 Hypertensive heart disease with heart failure: Secondary | ICD-10-CM | POA: Diagnosis not present

## 2017-07-17 NOTE — Progress Notes (Signed)
Patient ID: Jacqueline Reilly, female   DOB: 04/10/1924, 83 y.o.   MRN: [id number]   [facility]ED [facility] CLINIC NOTE  Patient ID: Jacqueline Reilly, female   DOB: 12-14-1923, 83 y.o.   MRN: [id number] PCP: [name] CHF: Jacqueline Reilly  HPI: [name] is an 83 y/o woman with CAD, AAA, systolic HF, mild dementia, afib and CAD. She has had two previous coronary interventions including a cutting balloon to her D2 in 2007 and a DES to her mid LCX in 2010. Her LV dysfunction has been out of proportion to her CAD.  Last echo in 2/18 showed EF 20-25% with severe LV dilation.  Admitted 7/11-7/24/15 with syncope and dyspnea. She had new onset A-fib/RVR. Troponin was 1.03> 1.59> 2.18 . She was placed on amiodarone and heparin drip. She developed respiratory distress and required intubation. She converted to NSR but later was bradycardiac with NSVT. Amiodarone temporarily stopped but restarted after hyperkalemia resolved. Hyperkalemia was thought to be contributing to bradycardia. Diuresed with IV lasix. Co-ox 29% and started on milrinone which we tried to wean off but she did not tolerate. Discharge weight 87 lbs.   Admitted in November 2016 for Gram-negative bacteremia ([place]) - enterobacter asburiae from PICC. Treated with ceftriaxone and then switched to oral levofloxacin for a total of 21 weeks and PICC replaced.   She has remained on milrinone for nearly 3 years now.   Had hip fracture and underwent repair in 5/18.  Admitted in 8/18 with bacteremia. Blood cultures pos for enterobacter and pan-sensitive ecoli species.Treated with vancomycin and Zosyn, patient was later changed to rocephin.   She returns for f/u with husband and [place] aide. She remains on milrinone. Feels good. Getting more stable. Uses walker at home. No SOB with ADLs. No edema. No problems with the PICC line. No recent falls. Does have some brusing with Eliquis. Husband is beligerent and calling his son a psychopath.   Echo 8/16  EF 25% with  diffuse hypokinesis, mild AS/mild AI, RV mildly dilated with moderately decreased systolic function.  Echo 2/18 EF 25-30% Echo 8/18 EF 25-30%   ROS: All systems negative except as listed in HPI, PMH and Problem List.  SH:  Social History   Social History  . Marital status: Married    Spouse name: N/A  . Number of children: 1  . Years of education: N/A   Occupational History  . retired    Social History Main Topics  . Smoking status: Never Smoker  . Smokeless tobacco: Never Used  . Alcohol use Yes     Comment: occasionally  . Drug use: No  . Sexual activity: Not Currently   Other Topics Concern  . Not on file   Social History Narrative   She lives in [place], family helps with her care.    FH:  Family History  Problem Relation Age of Onset  . Heart disease Mother   . Heart disease Father   . Heart disease Brother        x 3    Past Medical History:  Diagnosis Date  . AAA (abdominal aortic aneurysm) ([place])   . Atrial fibrillation ([place])   . Chronic systolic heart failure ([place])    a. ECHO (04/2014) EF 20-25%, diff HK, mild MR  . Coronary artery disease 2007   moderate ASCAD of the left system s/p PCI of the D2 and PCI of the left circ 03/2009  . Hyperlipidemia   . Hypertension   . PVD (  peripheral vascular disease) ([place])   . Renal artery stenosis ([place])   . Ventricular dysfunction    left; ischemic    Current Outpatient Prescriptions  Medication Sig Dispense Refill  . acetaminophen (TYLENOL) 500 MG tablet Take 1,000 mg by mouth 3 (three) times daily.    Marland Kitchen amiodarone (PACERONE) 200 MG tablet Take 1 tablet (200 mg total) by mouth daily. 30 tablet 0  . apixaban (ELIQUIS) 2.5 MG TABS tablet Take 1 tablet (2.5 mg total) by mouth 2 (two) times daily. 180 tablet 3  . busPIRone (BUSPAR) 5 MG tablet Take 5 mg by mouth daily as needed.    . cefUROXime (CEFTIN) 500 MG tablet Take 1 tablet (500 mg total) by mouth daily. 8 tablet 0  . ELIQUIS 2.5 MG TABS tablet TAKE ONE  TABLET BY MOUTH TWICE DAILY 60 tablet 3  . milrinone (PRIMACOR) 20 MG/100 ML SOLN infusion Inject 5.2 mcg/min into the vein continuous. 100 mL 0  . Multiple Vitamin (MULTIVITAMIN) tablet Take 1 tablet by mouth daily. 30 tablet 1  . Omega-3 Fatty Acids (FISH OIL) 1000 MG CAPS Take 1 capsule by mouth 2 (two) times daily.     Marland Kitchen torsemide (DEMADEX) 20 MG tablet Take 2 tablets (40 mg total) by mouth 2 (two) times daily. 60 tablet 0  . traMADol (ULTRAM) 50 MG tablet Take 25 mg by mouth every 8 (eight) hours as needed for moderate pain.     No current facility-administered medications for this encounter.     Vitals:   07/17/17 1018  BP: 138/78  Pulse: 72  SpO2: 93%  Weight: 89 lb 8 oz (40.6 kg)    Wt Readings from Last 3 Encounters:  07/17/17 89 lb 8 oz (40.6 kg)  06/14/17 95 lb (43.1 kg)  06/02/17 95 lb (43.1 kg)   PHYSICAL EXAM: General:  Elderly frail. No resp difficulty HEENT: normal x for ecchymosis under R eye  Neck: supple. JVP 6. Carotids 2+ bilat; no bruits. No lymphadenopathy or thryomegaly appreciated. Cor: PMI nondisplaced. Regular rate & rhythm. 2/6 SEM RSB Lungs: clear Abdomen: soft, nontender, nondistended. No hepatosplenomegaly. No bruits or masses. Good bowel sounds. Extremities: no cyanosis, clubbing, rash, edema  RUE picc ok  Has boott on RLE. + ecchymosis Neuro: alert & orientedx3, cranial nerves grossly intact. moves all 4 extremities w/o difficulty. Affect pleasant   ASSESSMENT & PLAN:   1) Chronic Systolic Heart Failure: Mixed ischemic/nonischemic cardiomyopathy (degree of LV dysfunction is out of proportion to CAD), EF 25% with moderate RV systolic dysfunction .  Echo  8/18 EF 25-30% - Stable NYHA II symptoms and volume status stable on milrinone. Will continue milrinone for palliative care purposes (has failed wean in past) - Volume status stable.  - Continue HH aide and [facility] support 2) Atrial fibrillation:  - Paroxysmal.  Remains in NSR - Continue apixaban  2.5 mg bid (weight less than 60 kg and age >79). Continue amiodarone 100 mg daily.   3) Limited Code: No CPR or defibrillation 4) CAD: No s/s of ischemia. Off ASA with Eliquis.  5) Dementia;     Glori Bickers MD  07/17/2017

## 2017-07-17 NOTE — Patient Instructions (Signed)
Your physician recommends that you schedule a follow-up appointment in: 3 months.  

## 2017-07-21 DIAGNOSIS — D631 Anemia in chronic kidney disease: Secondary | ICD-10-CM | POA: Diagnosis not present

## 2017-07-21 DIAGNOSIS — I481 Persistent atrial fibrillation: Secondary | ICD-10-CM | POA: Diagnosis not present

## 2017-07-21 DIAGNOSIS — N184 Chronic kidney disease, stage 4 (severe): Secondary | ICD-10-CM | POA: Diagnosis not present

## 2017-07-21 DIAGNOSIS — I130 Hypertensive heart and chronic kidney disease with heart failure and stage 1 through stage 4 chronic kidney disease, or unspecified chronic kidney disease: Secondary | ICD-10-CM | POA: Diagnosis not present

## 2017-07-21 DIAGNOSIS — S32302D Unspecified fracture of left ilium, subsequent encounter for fracture with routine healing: Secondary | ICD-10-CM | POA: Diagnosis not present

## 2017-07-21 DIAGNOSIS — I5042 Chronic combined systolic (congestive) and diastolic (congestive) heart failure: Secondary | ICD-10-CM | POA: Diagnosis not present

## 2017-07-24 ENCOUNTER — Telehealth (HOSPITAL_COMMUNITY): Payer: Self-pay | Admitting: *Deleted

## 2017-07-24 NOTE — Telephone Encounter (Signed)
Pt son states pt is having a hard time w/itching, he states this has been going on for a while and it is getting worse, he feels it could be from the Amio as the itching started after it was increased.  Discussed w/Dr Bensimhon, he states pt can decrease Amio bck to 100 mg (1/2 tab) daily, if itching does not improve f/u w/pcp.  Attempted to call pt's son back with recommendations and Left message to call back

## 2017-07-25 NOTE — Telephone Encounter (Signed)
Spoke with patients son he is aware and agreeable.

## 2017-07-28 DIAGNOSIS — N184 Chronic kidney disease, stage 4 (severe): Secondary | ICD-10-CM | POA: Diagnosis not present

## 2017-07-28 DIAGNOSIS — I130 Hypertensive heart and chronic kidney disease with heart failure and stage 1 through stage 4 chronic kidney disease, or unspecified chronic kidney disease: Secondary | ICD-10-CM | POA: Diagnosis not present

## 2017-07-28 DIAGNOSIS — I5042 Chronic combined systolic (congestive) and diastolic (congestive) heart failure: Secondary | ICD-10-CM | POA: Diagnosis not present

## 2017-07-28 DIAGNOSIS — I481 Persistent atrial fibrillation: Secondary | ICD-10-CM | POA: Diagnosis not present

## 2017-07-28 DIAGNOSIS — S32302D Unspecified fracture of left ilium, subsequent encounter for fracture with routine healing: Secondary | ICD-10-CM | POA: Diagnosis not present

## 2017-07-28 DIAGNOSIS — D631 Anemia in chronic kidney disease: Secondary | ICD-10-CM | POA: Diagnosis not present

## 2017-08-02 ENCOUNTER — Observation Stay (HOSPITAL_COMMUNITY)
Admission: EM | Admit: 2017-08-02 | Discharge: 2017-08-03 | Disposition: A | Discharge status: Home / Self Care | Payer: Medicare Other | Attending: Internal Medicine | Admitting: Internal Medicine

## 2017-08-02 ENCOUNTER — Encounter (HOSPITAL_COMMUNITY): Payer: Self-pay

## 2017-08-02 DIAGNOSIS — Z452 Encounter for adjustment and management of vascular access device: Secondary | ICD-10-CM | POA: Insufficient documentation

## 2017-08-02 DIAGNOSIS — Z885 Allergy status to narcotic agent status: Secondary | ICD-10-CM | POA: Diagnosis not present

## 2017-08-02 DIAGNOSIS — I739 Peripheral vascular disease, unspecified: Secondary | ICD-10-CM | POA: Diagnosis not present

## 2017-08-02 DIAGNOSIS — I130 Hypertensive heart and chronic kidney disease with heart failure and stage 1 through stage 4 chronic kidney disease, or unspecified chronic kidney disease: Secondary | ICD-10-CM | POA: Diagnosis not present

## 2017-08-02 DIAGNOSIS — I252 Old myocardial infarction: Secondary | ICD-10-CM | POA: Insufficient documentation

## 2017-08-02 DIAGNOSIS — Z7901 Long term (current) use of anticoagulants: Secondary | ICD-10-CM | POA: Insufficient documentation

## 2017-08-02 DIAGNOSIS — I5022 Chronic systolic (congestive) heart failure: Secondary | ICD-10-CM | POA: Diagnosis not present

## 2017-08-02 DIAGNOSIS — F0390 Unspecified dementia without behavioral disturbance: Secondary | ICD-10-CM | POA: Diagnosis not present

## 2017-08-02 DIAGNOSIS — I272 Pulmonary hypertension, unspecified: Secondary | ICD-10-CM | POA: Diagnosis not present

## 2017-08-02 DIAGNOSIS — E785 Hyperlipidemia, unspecified: Secondary | ICD-10-CM | POA: Insufficient documentation

## 2017-08-02 DIAGNOSIS — F419 Anxiety disorder, unspecified: Secondary | ICD-10-CM | POA: Diagnosis not present

## 2017-08-02 DIAGNOSIS — Z8249 Family history of ischemic heart disease and other diseases of the circulatory system: Secondary | ICD-10-CM | POA: Diagnosis not present

## 2017-08-02 DIAGNOSIS — N184 Chronic kidney disease, stage 4 (severe): Secondary | ICD-10-CM | POA: Insufficient documentation

## 2017-08-02 DIAGNOSIS — Z96642 Presence of left artificial hip joint: Secondary | ICD-10-CM | POA: Insufficient documentation

## 2017-08-02 DIAGNOSIS — Z79899 Other long term (current) drug therapy: Secondary | ICD-10-CM | POA: Diagnosis not present

## 2017-08-02 DIAGNOSIS — I420 Dilated cardiomyopathy: Secondary | ICD-10-CM | POA: Insufficient documentation

## 2017-08-02 DIAGNOSIS — I2510 Atherosclerotic heart disease of native coronary artery without angina pectoris: Secondary | ICD-10-CM | POA: Diagnosis not present

## 2017-08-02 DIAGNOSIS — I714 Abdominal aortic aneurysm, without rupture: Secondary | ICD-10-CM | POA: Insufficient documentation

## 2017-08-02 DIAGNOSIS — I509 Heart failure, unspecified: Secondary | ICD-10-CM

## 2017-08-02 DIAGNOSIS — I255 Ischemic cardiomyopathy: Secondary | ICD-10-CM | POA: Diagnosis not present

## 2017-08-02 DIAGNOSIS — Z91018 Allergy to other foods: Secondary | ICD-10-CM | POA: Insufficient documentation

## 2017-08-02 DIAGNOSIS — I481 Persistent atrial fibrillation: Secondary | ICD-10-CM | POA: Diagnosis not present

## 2017-08-02 LAB — CBC
HCT: 38 % (ref 36.0–46.0)
Hemoglobin: 12.3 g/dL (ref 12.0–15.0)
MCH: 28.6 pg (ref 26.0–34.0)
MCHC: 32.4 g/dL (ref 30.0–36.0)
MCV: 88.4 fL (ref 78.0–100.0)
Platelets: 320 10*3/uL (ref 150–400)
RBC: 4.3 MIL/uL (ref 3.87–5.11)
RDW: 16.8 % — ABNORMAL HIGH (ref 11.5–15.5)
WBC: 10.9 10*3/uL — ABNORMAL HIGH (ref 4.0–10.5)

## 2017-08-02 LAB — I-STAT TROPONIN, ED: Troponin i, poc: 0.01 ng/mL (ref 0.00–0.08)

## 2017-08-02 LAB — BASIC METABOLIC PANEL
Anion gap: 11 (ref 5–15)
BUN: 34 mg/dL — ABNORMAL HIGH (ref 6–20)
CO2: 28 mmol/L (ref 22–32)
Calcium: 9 mg/dL (ref 8.9–10.3)
Chloride: 96 mmol/L — ABNORMAL LOW (ref 101–111)
Creatinine, Ser: 1.24 mg/dL — ABNORMAL HIGH (ref 0.44–1.00)
GFR calc Af Amer: 43 mL/min — ABNORMAL LOW (ref >60)
GFR calc non Af Amer: 37 mL/min — ABNORMAL LOW (ref >60)
Glucose, Bld: 98 mg/dL (ref 65–99)
Potassium: 4.8 mmol/L (ref 3.5–5.1)
Sodium: 135 mmol/L (ref 135–145)

## 2017-08-02 NOTE — H&P (Signed)
[facility]                                                                                    Patient Demographics  Jacqueline Reilly, is a 83 y.o. female  CSN: [id number]  MRN: [id number]  DOB - May 29, 1924  Admit Date - 08/02/2017  Outpatient Primary MD for the patient is [name], MD   With History of -  Past Medical History:  Diagnosis Date  . AAA (abdominal aortic aneurysm) ([place])   . Atrial fibrillation ([place])   . Chronic systolic heart failure ([employer])    a. ECHO (04/2014) EF 20-25%, diff HK, mild MR  . Coronary artery disease 2007   moderate ASCAD of the left system s/p PCI of the D2 and PCI of the left circ 03/2009  . Hyperlipidemia   . Hypertension   . PVD (peripheral vascular disease) ([place])   . Renal artery stenosis ([place])   . Ventricular dysfunction    left; ischemic      Past Surgical History:  Procedure Laterality Date  . ANGIOPLASTY    . ANTERIOR APPROACH HEMI HIP ARTHROPLASTY Left 03/05/2017   Procedure: ANTERIOR APPROACH LEFT HIP HEMI ARTHROPLASTY;  Surgeon: Leandrew Koyanagi, MD;  Location: [place];  Service: Orthopedics;  Laterality: Left;  . CORONARY STENT PLACEMENT    . IR GENERIC HISTORICAL  09/25/2016   IR FLUORO GUIDE CV LINE RIGHT 09/25/2016 Ardis Rowan, PA-C [facility]  . retinal cryopexy     right eye (for retinal detachment)    in for   Chief Complaint  Patient presents with  . Vascular Access Problem     HPI  Jacqueline Reilly  is a 83 y.o. female, With past medical history significant for congestive heart failure, abdominal aortic aneurysm  on milrinone. Presenting to have her PICC line reinserted. Patient is confused and could not give any significant history    Review of Systems    Unable to obtain due to patient's status  Social History Social History  Substance Use Topics  . Smoking status: Never Smoker  . Smokeless tobacco: Never Used  . Alcohol use Yes     Comment: occasionally    Family History Family  History  Problem Relation Age of Onset  . Heart disease Mother   . Heart disease Father   . Heart disease Brother        x 3     Prior to Admission medications   Medication Sig Start Date End Date Taking? Authorizing Provider  acetaminophen (TYLENOL) 500 MG tablet Take 1,000 mg by mouth 3 (three) times daily.   Yes [provider]  amiodarone (PACERONE) 200 MG tablet Take 1 tablet (200 mg total) by mouth daily. Patient taking differently: Take 100 mg by mouth daily.  06/03/17  Yes Donne Hazel, MD  apixaban (ELIQUIS) 2.5 MG TABS tablet Take 1 tablet (2.5 mg total) by mouth 2 (two) times daily. 11/22/16  Yes Bensimhon, Shaune Pascal, MD  busPIRone (BUSPAR) 5 MG tablet Take 5 mg by mouth daily.    Yes [provider]  cefUROXime (CEFTIN) 500 MG tablet Take 1 tablet (500 mg total)  by mouth daily. 06/01/17  Yes Donne Hazel, MD  milrinone [facility]) 20 MG/100 ML SOLN infusion Inject 0.125 mcg/kg/min into the vein continuous. Rate 1.6 ml/hr Weight= 43 kg   Yes [provider]  Multiple Vitamin (MULTIVITAMIN) tablet Take 1 tablet by mouth daily. 07/26/14  Yes Larey Dresser, MD  Omega-3 Fatty Acids (FISH OIL) 1000 MG CAPS Take 1 capsule by mouth 2 (two) times daily.    Yes [provider]  torsemide (DEMADEX) 20 MG tablet Take 2 tablets (40 mg total) by mouth 2 (two) times daily. 06/01/17  Yes Donne Hazel, MD  traMADol (ULTRAM) 50 MG tablet Take 25 mg by mouth every 8 (eight) hours as needed for moderate pain.   Yes [provider]  ELIQUIS 2.5 MG TABS tablet TAKE ONE TABLET BY MOUTH TWICE DAILY Patient not taking: Reported on 08/02/2017 07/02/17   Bensimhon, Shaune Pascal, MD  milrinone (PRIMACOR) 20 MG/100 ML SOLN infusion Inject 5.2 mcg/min into the vein continuous. Patient not taking: Reported on 08/02/2017 05/30/17   Donne Hazel, MD    Allergies  Allergen Reactions  . Codeine Nausea Only  . Garlic Nausea Only    Physical Exam  Vitals  Blood  pressure 96/80, pulse 77, temperature 97.9 F (36.6 C), temperature source Oral, resp. rate 20, SpO2 93 %.   1. General Elderly female in no acute distress  2. , confused.  3. No F.N deficits, grossly.  4. Ears and Eyes appear Normal, Conjunctivae clear, PERRLA. Moist Oral Mucosa.  5. Supple Neck, No JVD, No cervical lymphadenopathy appriciated, No Carotid Bruits.  6. Symmetrical Chest wall movement, Good air movement bilaterally, CTAB.  7. RRR, No Gallops, Rubs or Murmurs, No Parasternal Heave.  8. Positive Bowel Sounds, Abdomen Soft, Non tender, No organomegaly appriciated,No rebound -guarding or rigidity.  9.  No Cyanosis, Normal Skin Turgor, No Skin Rash or Bruise.  10. Good muscle tone,  joints appear normal , no effusions, Normal ROM.    Data Review  CBC  Recent Labs Lab 08/02/17 2041  WBC 10.9*  HGB 12.3  HCT 38.0  PLT 320  MCV 88.4  MCH 28.6  MCHC 32.4  RDW 16.8*   ------------------------------------------------------------------------------------------------------------------  Chemistries   Recent Labs Lab 08/02/17 2041  NA 135  K 4.8  CL 96*  CO2 28  GLUCOSE 98  BUN 34*  CREATININE 1.24*  CALCIUM 9.0   ------------------------------------------------------------------------------------------------------------------ CrCl cannot be calculated (Unknown ideal weight.). ------------------------------------------------------------------------------------------------------------------ No results for input(s): TSH, T4TOTAL, T3FREE, THYROIDAB in the last 72 hours.  Invalid input(s): FREET3   Coagulation profile No results for input(s): INR, PROTIME in the last 168 hours. ------------------------------------------------------------------------------------------------------------------- No results for input(s): DDIMER in the last 72  hours. -------------------------------------------------------------------------------------------------------------------  Cardiac Enzymes No results for input(s): CKMB, TROPONINI, MYOGLOBIN in the last 168 hours.  Invalid input(s): CK ------------------------------------------------------------------------------------------------------------------ Invalid input(s): POCBNP   ---------------------------------------------------------------------------------------------------------------  Urinalysis    Component Value Date/Time   COLORURINE YELLOW 06/14/2017 1636   APPEARANCEUR CLEAR 06/14/2017 1636   LABSPEC 1.006 06/14/2017 1636   PHURINE 6.0 06/14/2017 1636   GLUCOSEU NEGATIVE 06/14/2017 1636   HGBUR LARGE (A) 06/14/2017 1636   BILIRUBINUR NEGATIVE 06/14/2017 1636   KETONESUR NEGATIVE 06/14/2017 1636   PROTEINUR NEGATIVE 06/14/2017 1636   UROBILINOGEN 0.2 09/01/2015 1254   NITRITE NEGATIVE 06/14/2017 1636   LEUKOCYTESUR MODERATE (A) 06/14/2017 1636    ----------------------------------------------------------------------------------------------------------------   Imaging results:     Assessment & Plan  Patient is being admitted under observation  for placement of PICC line .  Plan  Continue with home medications Consultant IV team for PICC line placement  DVT Prophylaxis  Eliquis  AM Labs Ordered, also please review Full Orders  Family Communication: Admission, patients condition and plan of care including tests being ordered have been discussed with the patient and son who indicate understanding and agree with the plan and Code Status.  Code Status full  Disposition Plan: Home with home health  Time spent in minutes : 31 minutes  Condition GUARDED   @SIGNATURE @

## 2017-08-02 NOTE — ED Triage Notes (Signed)
Pt arrives to Ed with Son and care giver; pt was at home and mistakenly pulled out PICC line in right upper arm;pt is heart pt on Milrinone; pt arrives a&ox 4; pt denies chest pain; EDP notified and EKG and blood drawn on pt; IV line started and Milrinone started back up in left FA IV site; Pt remains in triage waiting til room is available.

## 2017-08-02 NOTE — ED Provider Notes (Signed)
[facility] DEPT Provider Note   CSN: [id number] Arrival date & time: 08/02/17  2015     History   Chief Complaint Chief Complaint  Patient presents with  . Vascular Access Problem    HPI Jacqueline Reilly is a 83 y.o. female.  HPI  83 year old female With complicated past medical history presenting today with losing her PICC line. Patient is on milrinone.  were able to place a peripheral IV and start milrinone.no complaint otherwise.  Past Medical History:  Diagnosis Date  . AAA (abdominal aortic aneurysm) ([place])   . Atrial fibrillation ([place])   . Chronic systolic heart failure ([place])    a. ECHO (04/2014) EF 20-25%, diff HK, mild MR  . Coronary artery disease 2007   moderate ASCAD of the left system s/p PCI of the D2 and PCI of the left circ 03/2009  . Hyperlipidemia   . Hypertension   . PVD (peripheral vascular disease) ([place])   . Renal artery stenosis ([place])   . Ventricular dysfunction    left; ischemic    Patient Active Problem List   Diagnosis Date Noted  . SIRS (systemic inflammatory response syndrome) ([place]) 05/27/2017  . Acute blood loss anemia 05/27/2017  . Anxiety 05/27/2017  . CKD (chronic kidney disease), stage IV ([place]) 05/27/2017  . Heart failure with reduced ejection fraction ([place]) 04/05/2017  . Receiving inotropic medication 04/05/2017  . Hypokalemia 04/05/2017  . Malnutrition of moderate degree 03/05/2017  . Left displaced femoral neck fracture ([place]) 03/04/2017  . Leukocytosis 03/04/2017  . Hypoxia   . Gram-negative bacteremia 09/01/2015  . Chronic anemia 09/01/2015  . Persistent atrial fibrillation ([place])   . Acute on chronic systolic CHF (congestive heart failure) ([place]) 06/21/2015  . Atrial fibrillation ([place]) 08/10/2014  . Physical deconditioning 05/26/2014  . Acute respiratory failure with hypoxia ([place]) 05/08/2014  . NSTEMI, initial episode of care ([place]) 05/07/2014  . Atrial fibrillation with rapid ventricular response ([place]) 05/07/2014  . NSTEMI  (non-ST elevated myocardial infarction) ([place]) 05/07/2014  . Abnormal chest x-ray 04/24/2014  . CHF (congestive heart failure) ([place]) 04/23/2014  . Acute on chronic combined systolic and diastolic congestive heart failure ([place]) 04/23/2014  . AAA (abdominal aortic aneurysm) ([place]) 10/07/2013  . SOB (shortness of breath) 09/30/2013  . Coronary artery disease   . Ischemic dilated cardiomyopathy ([place])   . Hypertension     Past Surgical History:  Procedure Laterality Date  . ANGIOPLASTY    . ANTERIOR APPROACH HEMI HIP ARTHROPLASTY Left 03/05/2017   Procedure: ANTERIOR APPROACH LEFT HIP HEMI ARTHROPLASTY;  Surgeon: Leandrew Koyanagi, MD;  Location: [place];  Service: Orthopedics;  Laterality: Left;  . CORONARY STENT PLACEMENT    . IR GENERIC HISTORICAL  09/25/2016   IR FLUORO GUIDE CV LINE RIGHT 09/25/2016 Ardis Rowan, PA-C [facility]  . retinal cryopexy     right eye (for retinal detachment)    OB History    No data available       Home Medications    Prior to Admission medications   Medication Sig Start Date End Date Taking? Authorizing Provider  acetaminophen (TYLENOL) 500 MG tablet Take 1,000 mg by mouth 3 (three) times daily.    [provider]  amiodarone (PACERONE) 200 MG tablet Take 1 tablet (200 mg total) by mouth daily. 06/03/17   Donne Hazel, MD  apixaban (ELIQUIS) 2.5 MG TABS tablet Take 1 tablet (2.5 mg total) by mouth 2 (two) times daily. 11/22/16   Bensimhon, Shaune Pascal, MD  busPIRone (BUSPAR) 5 MG tablet Take 5 mg by mouth daily as needed.    [provider]  cefUROXime (CEFTIN) 500 MG tablet Take 1 tablet (500 mg total) by mouth daily. 06/01/17   Donne Hazel, MD  ELIQUIS 2.5 MG TABS tablet TAKE ONE TABLET BY MOUTH TWICE DAILY 07/02/17   Bensimhon, Shaune Pascal, MD  milrinone [facility]) 20 MG/100 ML SOLN infusion Inject 5.2 mcg/min into the vein continuous. 05/30/17   Donne Hazel, MD  Multiple Vitamin (MULTIVITAMIN) tablet Take 1 tablet by mouth daily.  07/26/14   Larey Dresser, MD  Omega-3 Fatty Acids (FISH OIL) 1000 MG CAPS Take 1 capsule by mouth 2 (two) times daily.     [provider]  torsemide (DEMADEX) 20 MG tablet Take 2 tablets (40 mg total) by mouth 2 (two) times daily. 06/01/17   Donne Hazel, MD  traMADol (ULTRAM) 50 MG tablet Take 25 mg by mouth every 8 (eight) hours as needed for moderate pain.    [provider]    Family History Family History  Problem Relation Age of Onset  . Heart disease Mother   . Heart disease Father   . Heart disease Brother        x 3    Social History Social History  Substance Use Topics  . Smoking status: Never Smoker  . Smokeless tobacco: Never Used  . Alcohol use Yes     Comment: occasionally     Allergies   Codeine and Garlic   Review of Systems Review of Systems  Constitutional: Negative for activity change.  Respiratory: Negative for shortness of breath.   Cardiovascular: Negative for chest pain.  Gastrointestinal: Negative for abdominal pain.     Physical Exam Updated Vital Signs BP 96/80 (BP Location: Left Arm)   Pulse 77   Temp 97.9 F (36.6 C) (Oral)   Resp 20   SpO2 93%   Physical Exam  Constitutional: She appears well-developed and well-nourished.  HENT:  Head: Normocephalic and atraumatic.  Eyes: Right eye exhibits no discharge. Left eye exhibits no discharge.  Cardiovascular: Normal rate.   No murmur heard. Pulmonary/Chest: Effort normal.  Neurological: No cranial nerve deficit.  Skin: Skin is warm and dry. She is not diaphoretic.  Psychiatric: She has a normal mood and affect.  Nursing note and vitals reviewed.    ED Treatments / Results  Labs (all labs ordered are listed, but only abnormal results are displayed) Labs Reviewed  BASIC METABOLIC PANEL - Abnormal; Notable for the following:       Result Value   Chloride 96 (*)    BUN 34 (*)    Creatinine, Ser 1.24 (*)    GFR calc non Af Amer 37 (*)    GFR calc Af Amer 43  (*)    All other components within normal limits  CBC - Abnormal; Notable for the following:    WBC 10.9 (*)    RDW 16.8 (*)    All other components within normal limits  I-STAT TROPONIN, ED    EKG  EKG Interpretation None       Radiology No results found.  Procedures Procedures (including critical care time)  Medications Ordered in ED Medications - No data to display   Initial Impression / Assessment and Plan / ED Course  I have reviewed the triage vital signs and the nursing notes.  Pertinent labs & imaging results that were available during my care of the patient were reviewed  by me and considered in my medical decision making (see chart for details).    83 year old female With complicated past medical history presenting today with losing her PICC line. Patient is on milrinone.  were able to place a peripheral IV and start milrinone.no complaint otherwise.  10:07 PM Admit for obs for picc line tomorrow.   Final Clinical Impressions(s) / ED Diagnoses   Final diagnoses:  None    New Prescriptions New Prescriptions   No medications on file     Macarthur Critchley, MD 08/02/17 2207

## 2017-08-03 DIAGNOSIS — I509 Heart failure, unspecified: Secondary | ICD-10-CM

## 2017-08-03 DIAGNOSIS — I272 Pulmonary hypertension, unspecified: Secondary | ICD-10-CM

## 2017-08-03 DIAGNOSIS — I130 Hypertensive heart and chronic kidney disease with heart failure and stage 1 through stage 4 chronic kidney disease, or unspecified chronic kidney disease: Secondary | ICD-10-CM | POA: Diagnosis not present

## 2017-08-03 LAB — BASIC METABOLIC PANEL
Anion gap: 10 (ref 5–15)
BUN: 31 mg/dL — ABNORMAL HIGH (ref 6–20)
CO2: 31 mmol/L (ref 22–32)
Calcium: 9 mg/dL (ref 8.9–10.3)
Chloride: 98 mmol/L — ABNORMAL LOW (ref 101–111)
Creatinine, Ser: 1.18 mg/dL — ABNORMAL HIGH (ref 0.44–1.00)
GFR calc Af Amer: 45 mL/min — ABNORMAL LOW (ref >60)
GFR calc non Af Amer: 39 mL/min — ABNORMAL LOW (ref >60)
Glucose, Bld: 95 mg/dL (ref 65–99)
Potassium: 4.4 mmol/L (ref 3.5–5.1)
Sodium: 139 mmol/L (ref 135–145)

## 2017-08-03 LAB — CBC
HCT: 38 % (ref 36.0–46.0)
Hemoglobin: 11.9 g/dL — ABNORMAL LOW (ref 12.0–15.0)
MCH: 27.7 pg (ref 26.0–34.0)
MCHC: 31.3 g/dL (ref 30.0–36.0)
MCV: 88.4 fL (ref 78.0–100.0)
Platelets: 271 10*3/uL (ref 150–400)
RBC: 4.3 MIL/uL (ref 3.87–5.11)
RDW: 17.1 % — ABNORMAL HIGH (ref 11.5–15.5)
WBC: 9.4 10*3/uL (ref 4.0–10.5)

## 2017-08-03 LAB — PROTIME-INR
INR: 1.14
Prothrombin Time: 14.6 seconds (ref 11.4–15.2)

## 2017-08-03 MED ORDER — SODIUM CHLORIDE 0.9 % IV SOLN
250.0000 mL | INTRAVENOUS | Status: DC | PRN
  Administered 2017-08-03: 250 mL via INTRAVENOUS

## 2017-08-03 MED ORDER — TRAMADOL HCL 50 MG PO TABS: 25.0000 mg | ORAL_TABLET | Freq: Three times a day (TID) | ORAL | Status: DC | PRN

## 2017-08-03 MED ORDER — MILRINONE LACTATE IN DEXTROSE 20-5 MG/100ML-% IV SOLN: 0.2500 ug/kg/min | INTRAVENOUS | Status: DC

## 2017-08-03 MED ORDER — MILRINONE LACTATE IN DEXTROSE 20-5 MG/100ML-% IV SOLN: 0.1250 ug/kg/min | INTRAVENOUS | Status: DC

## 2017-08-03 MED ORDER — ONDANSETRON HCL 4 MG PO TABS: 4.0000 mg | ORAL_TABLET | Freq: Four times a day (QID) | ORAL | Status: DC | PRN

## 2017-08-03 MED ORDER — HYDROXYZINE HCL 10 MG PO TABS: 5.0000 mg | ORAL_TABLET | Freq: Three times a day (TID) | ORAL | 0 refills | Status: DC | PRN

## 2017-08-03 MED ORDER — ONDANSETRON HCL 4 MG/2ML IJ SOLN: 4.0000 mg | Freq: Four times a day (QID) | INTRAMUSCULAR | Status: DC | PRN

## 2017-08-03 MED ORDER — MILRINONE LACTATE IN DEXTROSE 20-5 MG/100ML-% IV SOLN
0.1250 ug/kg/min | INTRAVENOUS | Status: DC
  Administered 2017-08-03: 0.125 ug/kg/min via INTRAVENOUS
  Filled 2017-08-03: qty 100

## 2017-08-03 MED ORDER — ACETAMINOPHEN 650 MG RE SUPP: 650.0000 mg | Freq: Four times a day (QID) | RECTAL | Status: DC | PRN

## 2017-08-03 MED ORDER — HYDROCODONE-ACETAMINOPHEN 5-325 MG PO TABS: 1.0000 | ORAL_TABLET | ORAL | Status: DC | PRN

## 2017-08-03 MED ORDER — SODIUM CHLORIDE 0.9% FLUSH: 10.0000 mL | INTRAVENOUS | Status: DC | PRN

## 2017-08-03 MED ORDER — BUSPIRONE HCL 10 MG PO TABS
5.0000 mg | ORAL_TABLET | Freq: Every day | ORAL | Status: DC
  Filled 2017-08-03: qty 1

## 2017-08-03 MED ORDER — ACETAMINOPHEN 500 MG PO TABS
1000.0000 mg | ORAL_TABLET | Freq: Three times a day (TID) | ORAL | Status: DC
  Administered 2017-08-03: 1000 mg via ORAL
  Filled 2017-08-03: qty 2

## 2017-08-03 MED ORDER — TORSEMIDE 20 MG PO TABS
40.0000 mg | ORAL_TABLET | Freq: Two times a day (BID) | ORAL | Status: DC
  Administered 2017-08-03: 40 mg via ORAL
  Filled 2017-08-03: qty 2

## 2017-08-03 MED ORDER — AMIODARONE HCL 100 MG PO TABS
100.0000 mg | ORAL_TABLET | Freq: Every day | ORAL | Status: DC
  Administered 2017-08-03: 100 mg via ORAL
  Filled 2017-08-03: qty 1

## 2017-08-03 MED ORDER — ACETAMINOPHEN 325 MG PO TABS: 650.0000 mg | ORAL_TABLET | Freq: Four times a day (QID) | ORAL | Status: DC | PRN

## 2017-08-03 MED ORDER — APIXABAN 2.5 MG PO TABS
2.5000 mg | ORAL_TABLET | Freq: Two times a day (BID) | ORAL | Status: DC
  Administered 2017-08-03: 2.5 mg via ORAL
  Filled 2017-08-03: qty 1

## 2017-08-03 MED ORDER — SODIUM CHLORIDE 0.9% FLUSH
3.0000 mL | Freq: Two times a day (BID) | INTRAVENOUS | Status: DC
  Administered 2017-08-03: 3 mL via INTRAVENOUS

## 2017-08-03 MED ORDER — SODIUM CHLORIDE 0.9% FLUSH: 3.0000 mL | INTRAVENOUS | Status: DC | PRN

## 2017-08-03 MED ORDER — ADULT MULTIVITAMIN W/MINERALS CH
1.0000 | ORAL_TABLET | Freq: Every day | ORAL | Status: DC
  Administered 2017-08-03: 1 via ORAL
  Filled 2017-08-03: qty 1

## 2017-08-03 NOTE — Discharge Summary (Signed)
Physician Discharge Summary  Jacqueline Reilly:[id number] DOB: 15-Sep-1924 DOA: 08/02/2017  PCP: [name], MD  Admit date: 08/02/2017 Discharge date: 08/03/2017  Admitted From: home Disposition:  home  Recommendations for Outpatient Follow-up:  1. Follow up with Dr. Haroldine Laws as scheduled  Home Health: RN Equipment/Devices: none  Discharge Condition: stable CODE STATUS: Full code Diet recommendation: heart healthy  HPI: Per Dr. Laren Everts, Jacqueline Reilly  is a 83 y.o. female, With past medical history significant for congestive heart failure, abdominal aortic aneurysm  on milrinone. Presenting to have her PICC line reinserted. Patient is confused and could not give any significant history  Hospital Course: Discharge Diagnoses:  Active Problems:   CHF (congestive heart failure) (HCC)   Pulmonary hypertension ([place])  83 year old woman with coronary artery disease, AAA, chronic systolic CHF, dementia, A. fib and coronary artery disease, on chronic home milrinone therapy via PICC line, also with A. fib, who was admitted to the hospital on 10/6 as her PICC line was removed (self-removed?) in unclear circumstances at home and she needed it replaced.  Patient was asymptomatic, at baseline, on 10/7 in the morning IV team placed a new PICC line and patient was discharged home in stable condition to resume her milrinone.  She was asymptomatic during her hospital stay, her vital signs were stable, she was on room air, blood work was essentially at baseline for the patient.  She has follow-up with cardiology as an outpatient.  Discharge Instructions   Allergies as of 08/03/2017      Reactions   Codeine Nausea Only   Garlic Nausea Only      Medication List    TAKE these medications   acetaminophen 500 MG tablet Commonly known as:  TYLENOL Take 1,000 mg by mouth 3 (three) times daily.   amiodarone 200 MG tablet Commonly known as:  PACERONE Take 1 tablet (200 mg total) by mouth  daily. What changed:  how much to take   apixaban 2.5 MG Tabs tablet Commonly known as:  ELIQUIS Take 1 tablet (2.5 mg total) by mouth 2 (two) times daily. What changed:  Another medication with the same name was removed. Continue taking this medication, and follow the directions you see here.   busPIRone 5 MG tablet Commonly known as:  BUSPAR Take 5 mg by mouth daily.   cefUROXime 500 MG tablet Commonly known as:  CEFTIN Take 1 tablet (500 mg total) by mouth daily.   Fish Oil 1000 MG Caps Take 1 capsule by mouth 2 (two) times daily.   hydrOXYzine 10 MG tablet Commonly known as:  ATARAX/VISTARIL Take 0.5 tablets (5 mg total) by mouth 3 (three) times daily as needed for itching.   milrinone 20 MG/100 ML Soln infusion Commonly known as:  PRIMACOR Inject 0.125 mcg/kg/min into the vein continuous. Rate 1.6 ml/hr Weight= 43 kg What changed:  Another medication with the same name was removed. Continue taking this medication, and follow the directions you see here.   multivitamin tablet Take 1 tablet by mouth daily.   torsemide 20 MG tablet Commonly known as:  DEMADEX Take 2 tablets (40 mg total) by mouth 2 (two) times daily.   traMADol 50 MG tablet Commonly known as:  ULTRAM Take 25 mg by mouth every 8 (eight) hours as needed for moderate pain.      Follow-up Information    [name], MD Follow up.   Specialty:  Internal Medicine Why:  as needed Contact information: [address]  [place] [address] [phone]        [facility] - Dme Follow up.   Why:  [facility] is the provider of all equipment and medications they do not provide the RN to assist with care. All phone numbers are available on equipment.  Contact information: [address] [place] [address] [place], [facility] Follow up.   Specialty:  Home Health Services Why:  CM contacted Triage with this agency to make them  aware of your [facility] needs. They will come to the hospital to reconnect you to your Home infusion therapy Contact information: [place]  [address] [phone]           Consultations:  None   Procedures/Studies:   No results found.   Subjective: - no chest pain, shortness of breath, no abdominal pain, nausea or vomiting.   Discharge Exam: Vitals:   08/03/17 0600 08/03/17 1217  BP: (!) 106/54 117/64  Pulse: (!) 59 62  Resp: 18 18  Temp: (!) 97.3 F (36.3 C) 98.3 F (36.8 C)  SpO2: 97% 98%    General: Pt is alert, awake, not in acute distress Cardiovascular: RRR, S1/S2 +, no rubs, no gallops Respiratory: CTA bilaterally, no wheezing, no rhonchi Abdominal: Soft, NT, ND, bowel sounds + Extremities: no edema, no cyanosis    The results of significant diagnostics from this hospitalization (including imaging, microbiology, ancillary and laboratory) are listed below for reference.     Microbiology: No results found for this or any previous visit (from the past 240 hour(s)).   Labs: BNP (last 3 results)  Recent Labs  03/04/17 1235 04/04/17 2331 05/27/17 0324  BNP 69.6 412.3* 983.3*   Basic Metabolic Panel:  Recent Labs Lab 08/02/17 2041 08/03/17 0112  NA 135 139  K 4.8 4.4  CL 96* 98*  CO2 28 31  GLUCOSE 98 95  BUN 34* 31*  CREATININE 1.24* 1.18*  CALCIUM 9.0 9.0   Liver Function Tests: No results for input(s): AST, ALT, ALKPHOS, BILITOT, PROT, ALBUMIN in the last 168 hours. No results for input(s): LIPASE, AMYLASE in the last 168 hours. No results for input(s): AMMONIA in the last 168 hours. CBC:  Recent Labs Lab 08/02/17 2041 08/03/17 0112  WBC 10.9* 9.4  HGB 12.3 11.9*  HCT 38.0 38.0  MCV 88.4 88.4  PLT 320 271   Cardiac Enzymes: No results for input(s): CKTOTAL, CKMB, CKMBINDEX, TROPONINI in the last 168 hours. BNP: Invalid input(s): POCBNP CBG: No results for input(s): GLUCAP in the last 168  hours. D-Dimer No results for input(s): DDIMER in the last 72 hours. Hgb A1c No results for input(s): HGBA1C in the last 72 hours. Lipid Profile No results for input(s): CHOL, HDL, LDLCALC, TRIG, CHOLHDL, LDLDIRECT in the last 72 hours. Thyroid function studies No results for input(s): TSH, T4TOTAL, T3FREE, THYROIDAB in the last 72 hours.  Invalid input(s): FREET3 Anemia work up No results for input(s): VITAMINB12, FOLATE, FERRITIN, TIBC, IRON, RETICCTPCT in the last 72 hours. Urinalysis    Component Value Date/Time   COLORURINE YELLOW 06/14/2017 1636   APPEARANCEUR CLEAR 06/14/2017 1636   LABSPEC 1.006 06/14/2017 1636   PHURINE 6.0 06/14/2017 1636   GLUCOSEU NEGATIVE 06/14/2017 1636   HGBUR LARGE (A) 06/14/2017 1636   BILIRUBINUR NEGATIVE 06/14/2017 1636   KETONESUR NEGATIVE 06/14/2017 1636   PROTEINUR NEGATIVE 06/14/2017 1636   UROBILINOGEN 0.2 09/01/2015 1254  NITRITE NEGATIVE 06/14/2017 1636   LEUKOCYTESUR MODERATE (A) 06/14/2017 1636   Sepsis Labs Invalid input(s): PROCALCITONIN,  WBC,  LACTICIDVEN   Time coordinating discharge: 15 minutes  SIGNED:  Marzetta Board, MD  [facility] 08/03/2017, 3:58 PM Pager [phone]  If 7PM-7AM, please contact night-coverage [email or web address] Password [email or web address]

## 2017-08-03 NOTE — Progress Notes (Signed)
According to care management, patient's son to hook up home milrinone to new PICC line and the home health agency will meet the patient at their house.  [name] hooked up home milrinone to patient.  [name] given discharge instructions and all questions answered.  Patient discharged via wheelchair with all belongings including walker with seat.

## 2017-08-03 NOTE — Progress Notes (Signed)
Peripherally Inserted Central Catheter/Midline Placement  The IV Nurse has discussed with the patient and/or persons authorized to consent for the patient, the purpose of this procedure and the potential benefits and risks involved with this procedure.  The benefits include less needle sticks, lab draws from the catheter, and the patient may be discharged home with the catheter. Risks include, but not limited to, infection, bleeding, blood clot (thrombus formation), and puncture of an artery; nerve damage and irregular heartbeat and possibility to perform a PICC exchange if needed/ordered by physician.  Alternatives to this procedure were also discussed.  Bard Power PICC patient education guide, fact sheet on infection prevention and patient information card has been provided to patient /or left at bedside. Son at bedside signed consent due to pt hx of dementia.   PICC/Midline Placement Documentation  PICC Single Lumen PICC Right (Active)     PICC Single Lumen 06/01/17 PICC Right Brachial 37 cm 1 cm (Active)     PICC Single Lumen 08/03/17 PICC Right Brachial 35 cm 1 cm (Active)  Indication for Insertion or Continuance of Line Home intravenous therapies (PICC only) 08/03/2017 10:00 AM  Exposed Catheter (cm) 1 cm 08/03/2017 10:00 AM  Site Assessment Clean;Dry;Intact 08/03/2017 10:00 AM  Line Status Flushed;Saline locked;Blood return noted 08/03/2017 10:00 AM  Dressing Type Transparent 08/03/2017 10:00 AM  Dressing Status Clean;Intact;Dry;Antimicrobial disc in place 08/03/2017 10:00 AM  Line Care Connections checked and tightened 08/03/2017 10:00 AM  Line Adjustment (NICU/IV Team Only) No 08/03/2017 10:00 AM  Dressing Intervention New dressing 08/03/2017 10:00 AM  Dressing Change Due 08/10/17 08/03/2017 10:00 AM     PICC / Midline Double Lumen 25/42/70 PICC Left Basilic 43 cm 0 cm (Active)     PICC Triple Lumen 04/05/17 (Active)       Rolena Infante 08/03/2017, 10:24 AM

## 2017-08-03 NOTE — Care Management Note (Addendum)
Case Management Note  Patient Details  Name: Jacqueline Reilly MRN: [id number] Date of Birth: Jan 29, 1924  Subjective/Objective:   CM received call re pt PICC replaced and need confirmation of [place] [facility] and meds available and ready for D/C today. Cm spoke with son, Jacqueline Reilly who is sleeping in car in parking lot and tells me all equipment and meds in room. Jacqueline Reilly with [facility] has confirmed that he will provide [facility] to re access PICC and facilitate discharge home with IV Milrinone today. Jacqueline Reilly additional CM needs at this time.  12:42: [facility] has been contacted, (as they are the agency who provides [facility] services for the Home infusion therapy) Jacqueline Reilly will be notified of discharge and need for an RN to be available to reconnect pt so she can be discharged.  She will return my call  13:31: Jacqueline Reilly with [facility] to accept resumption of care. Jacqueline Reilly the son has been well educated and can reconnect pt to home infusion with Hospital staff observing. No RN from [facility] needed per Jacqueline Reilly. [facility] will resume care when pt discharged and home. Made Jacqueline Reilly bedside RN aware of latter and will sign off for now.                 Action/Plan: CM will sign off for now but will be available should additional discharge needs arise or disposition change.    Expected Discharge Date:  08/03/17               Expected Discharge Plan:     In-House Referral:     Discharge planning Services  CM Consult  Post Acute Care Choice:  Durable Medical Equipment, Home Health, Resumption of Svcs/PTA Provider Choice offered to:  Patient, Adult Children Jacqueline Reilly reports all meds and pump in room)  DME Arranged:    DME Agency:  [facility] Arranged:  RN, PT [facility] Agency:  [place]  Status of Service:  Completed, signed off  If discussed at [place] of Stay Meetings, dates discussed:    Additional Comments:  Jacqueline Sawyers, RN 08/03/2017, 10:58 AM

## 2017-08-04 DIAGNOSIS — S32302D Unspecified fracture of left ilium, subsequent encounter for fracture with routine healing: Secondary | ICD-10-CM | POA: Diagnosis not present

## 2017-08-04 DIAGNOSIS — N184 Chronic kidney disease, stage 4 (severe): Secondary | ICD-10-CM | POA: Diagnosis not present

## 2017-08-04 DIAGNOSIS — I481 Persistent atrial fibrillation: Secondary | ICD-10-CM | POA: Diagnosis not present

## 2017-08-04 DIAGNOSIS — D631 Anemia in chronic kidney disease: Secondary | ICD-10-CM | POA: Diagnosis not present

## 2017-08-04 DIAGNOSIS — I5042 Chronic combined systolic (congestive) and diastolic (congestive) heart failure: Secondary | ICD-10-CM | POA: Diagnosis not present

## 2017-08-04 DIAGNOSIS — I130 Hypertensive heart and chronic kidney disease with heart failure and stage 1 through stage 4 chronic kidney disease, or unspecified chronic kidney disease: Secondary | ICD-10-CM | POA: Diagnosis not present

## 2017-08-11 DIAGNOSIS — N184 Chronic kidney disease, stage 4 (severe): Secondary | ICD-10-CM | POA: Diagnosis not present

## 2017-08-11 DIAGNOSIS — I5042 Chronic combined systolic (congestive) and diastolic (congestive) heart failure: Secondary | ICD-10-CM | POA: Diagnosis not present

## 2017-08-11 DIAGNOSIS — I130 Hypertensive heart and chronic kidney disease with heart failure and stage 1 through stage 4 chronic kidney disease, or unspecified chronic kidney disease: Secondary | ICD-10-CM | POA: Diagnosis not present

## 2017-08-11 DIAGNOSIS — I481 Persistent atrial fibrillation: Secondary | ICD-10-CM | POA: Diagnosis not present

## 2017-08-11 DIAGNOSIS — D631 Anemia in chronic kidney disease: Secondary | ICD-10-CM | POA: Diagnosis not present

## 2017-08-11 DIAGNOSIS — S32302D Unspecified fracture of left ilium, subsequent encounter for fracture with routine healing: Secondary | ICD-10-CM | POA: Diagnosis not present

## 2017-08-18 DIAGNOSIS — I481 Persistent atrial fibrillation: Secondary | ICD-10-CM | POA: Diagnosis not present

## 2017-08-18 DIAGNOSIS — I130 Hypertensive heart and chronic kidney disease with heart failure and stage 1 through stage 4 chronic kidney disease, or unspecified chronic kidney disease: Secondary | ICD-10-CM | POA: Diagnosis not present

## 2017-08-18 DIAGNOSIS — D631 Anemia in chronic kidney disease: Secondary | ICD-10-CM | POA: Diagnosis not present

## 2017-08-18 DIAGNOSIS — S32302D Unspecified fracture of left ilium, subsequent encounter for fracture with routine healing: Secondary | ICD-10-CM | POA: Diagnosis not present

## 2017-08-18 DIAGNOSIS — N184 Chronic kidney disease, stage 4 (severe): Secondary | ICD-10-CM | POA: Diagnosis not present

## 2017-08-18 DIAGNOSIS — I5042 Chronic combined systolic (congestive) and diastolic (congestive) heart failure: Secondary | ICD-10-CM | POA: Diagnosis not present

## 2017-08-19 DIAGNOSIS — I481 Persistent atrial fibrillation: Secondary | ICD-10-CM | POA: Diagnosis not present

## 2017-08-19 DIAGNOSIS — D631 Anemia in chronic kidney disease: Secondary | ICD-10-CM | POA: Diagnosis not present

## 2017-08-19 DIAGNOSIS — I5042 Chronic combined systolic (congestive) and diastolic (congestive) heart failure: Secondary | ICD-10-CM | POA: Diagnosis not present

## 2017-08-19 DIAGNOSIS — I130 Hypertensive heart and chronic kidney disease with heart failure and stage 1 through stage 4 chronic kidney disease, or unspecified chronic kidney disease: Secondary | ICD-10-CM | POA: Diagnosis not present

## 2017-08-19 DIAGNOSIS — N184 Chronic kidney disease, stage 4 (severe): Secondary | ICD-10-CM | POA: Diagnosis not present

## 2017-08-19 DIAGNOSIS — S32302D Unspecified fracture of left ilium, subsequent encounter for fracture with routine healing: Secondary | ICD-10-CM | POA: Diagnosis not present

## 2017-08-20 DIAGNOSIS — I5042 Chronic combined systolic (congestive) and diastolic (congestive) heart failure: Secondary | ICD-10-CM | POA: Diagnosis not present

## 2017-08-20 DIAGNOSIS — E782 Mixed hyperlipidemia: Secondary | ICD-10-CM | POA: Diagnosis not present

## 2017-08-20 DIAGNOSIS — D630 Anemia in neoplastic disease: Secondary | ICD-10-CM | POA: Diagnosis not present

## 2017-08-20 DIAGNOSIS — F0390 Unspecified dementia without behavioral disturbance: Secondary | ICD-10-CM | POA: Diagnosis not present

## 2017-08-20 DIAGNOSIS — Z9981 Dependence on supplemental oxygen: Secondary | ICD-10-CM | POA: Diagnosis not present

## 2017-08-20 DIAGNOSIS — J9690 Respiratory failure, unspecified, unspecified whether with hypoxia or hypercapnia: Secondary | ICD-10-CM | POA: Diagnosis not present

## 2017-08-20 DIAGNOSIS — I130 Hypertensive heart and chronic kidney disease with heart failure and stage 1 through stage 4 chronic kidney disease, or unspecified chronic kidney disease: Secondary | ICD-10-CM | POA: Diagnosis not present

## 2017-08-20 DIAGNOSIS — I2510 Atherosclerotic heart disease of native coronary artery without angina pectoris: Secondary | ICD-10-CM | POA: Diagnosis not present

## 2017-08-20 DIAGNOSIS — Z7901 Long term (current) use of anticoagulants: Secondary | ICD-10-CM | POA: Diagnosis not present

## 2017-08-20 DIAGNOSIS — N184 Chronic kidney disease, stage 4 (severe): Secondary | ICD-10-CM | POA: Diagnosis not present

## 2017-08-20 DIAGNOSIS — E43 Unspecified severe protein-calorie malnutrition: Secondary | ICD-10-CM | POA: Diagnosis not present

## 2017-08-20 DIAGNOSIS — D631 Anemia in chronic kidney disease: Secondary | ICD-10-CM | POA: Diagnosis not present

## 2017-08-20 DIAGNOSIS — Z96642 Presence of left artificial hip joint: Secondary | ICD-10-CM | POA: Diagnosis not present

## 2017-08-20 DIAGNOSIS — I420 Dilated cardiomyopathy: Secondary | ICD-10-CM | POA: Diagnosis not present

## 2017-08-20 DIAGNOSIS — I739 Peripheral vascular disease, unspecified: Secondary | ICD-10-CM | POA: Diagnosis not present

## 2017-08-20 DIAGNOSIS — I481 Persistent atrial fibrillation: Secondary | ICD-10-CM | POA: Diagnosis not present

## 2017-08-20 DIAGNOSIS — Z452 Encounter for adjustment and management of vascular access device: Secondary | ICD-10-CM | POA: Diagnosis not present

## 2017-08-20 DIAGNOSIS — Z9181 History of falling: Secondary | ICD-10-CM | POA: Diagnosis not present

## 2017-08-20 DIAGNOSIS — I252 Old myocardial infarction: Secondary | ICD-10-CM | POA: Diagnosis not present

## 2017-08-20 DIAGNOSIS — Z79891 Long term (current) use of opiate analgesic: Secondary | ICD-10-CM | POA: Diagnosis not present

## 2017-08-20 DIAGNOSIS — F419 Anxiety disorder, unspecified: Secondary | ICD-10-CM | POA: Diagnosis not present

## 2017-08-20 DIAGNOSIS — S32302D Unspecified fracture of left ilium, subsequent encounter for fracture with routine healing: Secondary | ICD-10-CM | POA: Diagnosis not present

## 2017-08-25 DIAGNOSIS — I5042 Chronic combined systolic (congestive) and diastolic (congestive) heart failure: Secondary | ICD-10-CM | POA: Diagnosis not present

## 2017-08-25 DIAGNOSIS — D631 Anemia in chronic kidney disease: Secondary | ICD-10-CM | POA: Diagnosis not present

## 2017-08-25 DIAGNOSIS — I130 Hypertensive heart and chronic kidney disease with heart failure and stage 1 through stage 4 chronic kidney disease, or unspecified chronic kidney disease: Secondary | ICD-10-CM | POA: Diagnosis not present

## 2017-08-25 DIAGNOSIS — N184 Chronic kidney disease, stage 4 (severe): Secondary | ICD-10-CM | POA: Diagnosis not present

## 2017-08-25 DIAGNOSIS — I481 Persistent atrial fibrillation: Secondary | ICD-10-CM | POA: Diagnosis not present

## 2017-08-25 DIAGNOSIS — S32302D Unspecified fracture of left ilium, subsequent encounter for fracture with routine healing: Secondary | ICD-10-CM | POA: Diagnosis not present

## 2017-08-29 DIAGNOSIS — I5020 Unspecified systolic (congestive) heart failure: Secondary | ICD-10-CM | POA: Diagnosis not present

## 2017-08-29 DIAGNOSIS — R21 Rash and other nonspecific skin eruption: Secondary | ICD-10-CM | POA: Diagnosis not present

## 2017-08-29 DIAGNOSIS — Z111 Encounter for screening for respiratory tuberculosis: Secondary | ICD-10-CM | POA: Diagnosis not present

## 2017-08-29 DIAGNOSIS — F0390 Unspecified dementia without behavioral disturbance: Secondary | ICD-10-CM | POA: Diagnosis not present

## 2017-08-29 DIAGNOSIS — Z23 Encounter for immunization: Secondary | ICD-10-CM | POA: Diagnosis not present

## 2017-08-29 DIAGNOSIS — R05 Cough: Secondary | ICD-10-CM | POA: Diagnosis not present

## 2017-09-01 DIAGNOSIS — I481 Persistent atrial fibrillation: Secondary | ICD-10-CM | POA: Diagnosis not present

## 2017-09-01 DIAGNOSIS — I5042 Chronic combined systolic (congestive) and diastolic (congestive) heart failure: Secondary | ICD-10-CM | POA: Diagnosis not present

## 2017-09-01 DIAGNOSIS — N184 Chronic kidney disease, stage 4 (severe): Secondary | ICD-10-CM | POA: Diagnosis not present

## 2017-09-01 DIAGNOSIS — I130 Hypertensive heart and chronic kidney disease with heart failure and stage 1 through stage 4 chronic kidney disease, or unspecified chronic kidney disease: Secondary | ICD-10-CM | POA: Diagnosis not present

## 2017-09-01 DIAGNOSIS — D631 Anemia in chronic kidney disease: Secondary | ICD-10-CM | POA: Diagnosis not present

## 2017-09-01 DIAGNOSIS — S32302D Unspecified fracture of left ilium, subsequent encounter for fracture with routine healing: Secondary | ICD-10-CM | POA: Diagnosis not present

## 2017-09-08 DIAGNOSIS — I5042 Chronic combined systolic (congestive) and diastolic (congestive) heart failure: Secondary | ICD-10-CM | POA: Diagnosis not present

## 2017-09-08 DIAGNOSIS — D631 Anemia in chronic kidney disease: Secondary | ICD-10-CM | POA: Diagnosis not present

## 2017-09-08 DIAGNOSIS — S32302D Unspecified fracture of left ilium, subsequent encounter for fracture with routine healing: Secondary | ICD-10-CM | POA: Diagnosis not present

## 2017-09-08 DIAGNOSIS — N184 Chronic kidney disease, stage 4 (severe): Secondary | ICD-10-CM | POA: Diagnosis not present

## 2017-09-08 DIAGNOSIS — I481 Persistent atrial fibrillation: Secondary | ICD-10-CM | POA: Diagnosis not present

## 2017-09-08 DIAGNOSIS — I130 Hypertensive heart and chronic kidney disease with heart failure and stage 1 through stage 4 chronic kidney disease, or unspecified chronic kidney disease: Secondary | ICD-10-CM | POA: Diagnosis not present

## 2017-09-16 ENCOUNTER — Ambulatory Visit: Payer: Medicare Other | Admitting: Podiatry

## 2017-09-16 DIAGNOSIS — S32302D Unspecified fracture of left ilium, subsequent encounter for fracture with routine healing: Secondary | ICD-10-CM | POA: Diagnosis not present

## 2017-09-16 DIAGNOSIS — D631 Anemia in chronic kidney disease: Secondary | ICD-10-CM | POA: Diagnosis not present

## 2017-09-16 DIAGNOSIS — N184 Chronic kidney disease, stage 4 (severe): Secondary | ICD-10-CM | POA: Diagnosis not present

## 2017-09-16 DIAGNOSIS — I481 Persistent atrial fibrillation: Secondary | ICD-10-CM | POA: Diagnosis not present

## 2017-09-16 DIAGNOSIS — I5042 Chronic combined systolic (congestive) and diastolic (congestive) heart failure: Secondary | ICD-10-CM | POA: Diagnosis not present

## 2017-09-16 DIAGNOSIS — I130 Hypertensive heart and chronic kidney disease with heart failure and stage 1 through stage 4 chronic kidney disease, or unspecified chronic kidney disease: Secondary | ICD-10-CM | POA: Diagnosis not present

## 2017-09-22 DIAGNOSIS — I5042 Chronic combined systolic (congestive) and diastolic (congestive) heart failure: Secondary | ICD-10-CM | POA: Diagnosis not present

## 2017-09-22 DIAGNOSIS — D631 Anemia in chronic kidney disease: Secondary | ICD-10-CM | POA: Diagnosis not present

## 2017-09-22 DIAGNOSIS — N184 Chronic kidney disease, stage 4 (severe): Secondary | ICD-10-CM | POA: Diagnosis not present

## 2017-09-22 DIAGNOSIS — S32302D Unspecified fracture of left ilium, subsequent encounter for fracture with routine healing: Secondary | ICD-10-CM | POA: Diagnosis not present

## 2017-09-22 DIAGNOSIS — I481 Persistent atrial fibrillation: Secondary | ICD-10-CM | POA: Diagnosis not present

## 2017-09-22 DIAGNOSIS — I130 Hypertensive heart and chronic kidney disease with heart failure and stage 1 through stage 4 chronic kidney disease, or unspecified chronic kidney disease: Secondary | ICD-10-CM | POA: Diagnosis not present

## 2017-09-29 DIAGNOSIS — I130 Hypertensive heart and chronic kidney disease with heart failure and stage 1 through stage 4 chronic kidney disease, or unspecified chronic kidney disease: Secondary | ICD-10-CM | POA: Diagnosis not present

## 2017-09-29 DIAGNOSIS — I481 Persistent atrial fibrillation: Secondary | ICD-10-CM | POA: Diagnosis not present

## 2017-09-29 DIAGNOSIS — S32302D Unspecified fracture of left ilium, subsequent encounter for fracture with routine healing: Secondary | ICD-10-CM | POA: Diagnosis not present

## 2017-09-29 DIAGNOSIS — N184 Chronic kidney disease, stage 4 (severe): Secondary | ICD-10-CM | POA: Diagnosis not present

## 2017-09-29 DIAGNOSIS — I5042 Chronic combined systolic (congestive) and diastolic (congestive) heart failure: Secondary | ICD-10-CM | POA: Diagnosis not present

## 2017-09-29 DIAGNOSIS — D631 Anemia in chronic kidney disease: Secondary | ICD-10-CM | POA: Diagnosis not present

## 2017-10-02 ENCOUNTER — Encounter (HOSPITAL_COMMUNITY): Payer: Medicare Other | Admitting: Internal Medicine

## 2017-10-02 ENCOUNTER — Ambulatory Visit (HOSPITAL_COMMUNITY)
Admission: RE | Admit: 2017-10-02 | Discharge: 2017-10-02 | Disposition: A | Discharge status: Home / Self Care | Payer: Medicare Other | Source: Ambulatory Visit | Attending: Internal Medicine | Admitting: Internal Medicine

## 2017-10-02 ENCOUNTER — Encounter (HOSPITAL_COMMUNITY): Payer: Self-pay | Admitting: Internal Medicine

## 2017-10-02 VITALS — BP 143/72 | HR 71 | Wt 94.0 lb

## 2017-10-02 DIAGNOSIS — Z79891 Long term (current) use of opiate analgesic: Secondary | ICD-10-CM | POA: Insufficient documentation

## 2017-10-02 DIAGNOSIS — Z79899 Other long term (current) drug therapy: Secondary | ICD-10-CM | POA: Insufficient documentation

## 2017-10-02 DIAGNOSIS — I5022 Chronic systolic (congestive) heart failure: Secondary | ICD-10-CM | POA: Diagnosis not present

## 2017-10-02 DIAGNOSIS — I429 Cardiomyopathy, unspecified: Secondary | ICD-10-CM | POA: Insufficient documentation

## 2017-10-02 DIAGNOSIS — I2510 Atherosclerotic heart disease of native coronary artery without angina pectoris: Secondary | ICD-10-CM | POA: Diagnosis not present

## 2017-10-02 DIAGNOSIS — I110 Hypertensive heart disease with heart failure: Secondary | ICD-10-CM | POA: Insufficient documentation

## 2017-10-02 DIAGNOSIS — F0390 Unspecified dementia without behavioral disturbance: Secondary | ICD-10-CM | POA: Diagnosis not present

## 2017-10-02 DIAGNOSIS — I480 Paroxysmal atrial fibrillation: Secondary | ICD-10-CM

## 2017-10-02 NOTE — Patient Instructions (Signed)
DOING GREAT  Your physician recommends that you schedule a follow-up appointment in: [facility]

## 2017-10-02 NOTE — Progress Notes (Signed)
Patient ID: Jacqueline Reilly, female   DOB: 01/16/1924, 83 y.o.   MRN: [id number]   ADVANCED HF CLINIC NOTE  Patient ID: Jacqueline Reilly, female   DOB: 1924/10/02, 83 y.o.   MRN: [id number] PCP: [name] CHF: Christyana Corwin  HPI: [name] is an 83 y/o woman with CAD, AAA, systolic HF, mild dementia, afib and CAD. She has had two previous coronary interventions including a cutting balloon to her D2 in 2007 and a DES to her mid LCX in 2010. Her LV dysfunction has been out of proportion to her CAD.  Last echo in 2/18 showed EF 20-25% with severe LV dilation.  Admitted 7/11-7/24/15 with syncope and dyspnea. She had new onset A-fib/RVR. Troponin was 1.03> 1.59> 2.18 . She was placed on amiodarone and heparin drip. She developed respiratory distress and required intubation. She converted to NSR but later was bradycardiac with NSVT. Amiodarone temporarily stopped but restarted after hyperkalemia resolved. Hyperkalemia was thought to be contributing to bradycardia. Diuresed with IV lasix. Co-ox 29% and started on milrinone which we tried to wean off but she did not tolerate. Discharge weight 87 lbs.   Admitted in November 2016 for Gram-negative bacteremia ([place]) - enterobacter asburiae from PICC. Treated with ceftriaxone and then switched to oral levofloxacin for a total of 21 weeks and PICC replaced.   She has remained on milrinone for nearly 3 years now.   Had hip fracture and underwent repair in 5/18.  Admitted in 8/18 with bacteremia. Blood cultures pos for enterobacter and pan-sensitive ecoli species.Treated with vancomycin and Zosyn, patient was later changed to rocephin.   She returns for f/u with her [facility] aide. She remains on milrinone. Says she feels good. Says she is not sure why she is here. Denies SOB, orthopnea or PND. No problems with PICC> No fevers or chills.   Echo 8/16  EF 25% with diffuse hypokinesis, mild AS/mild AI, RV mildly dilated with moderately decreased systolic function.    Echo 2/18 EF 25-30% Echo 8/18 EF 25-30%   ROS: All systems negative except as listed in HPI, PMH and Problem List.  SH:  Social History   Socioeconomic History  . Marital status: Married    Spouse name: Not on file  . Number of children: 1  . Years of education: Not on file  . Highest education level: Not on file  Social Needs  . Financial resource strain: Not on file  . Food insecurity - worry: Not on file  . Food insecurity - inability: Not on file  . Transportation needs - medical: Not on file  . Transportation needs - non-medical: Not on file  Occupational History  . Occupation: retired  Tobacco Use  . Smoking status: Never Smoker  . Smokeless tobacco: Never Used  Substance and Sexual Activity  . Alcohol use: Yes    Comment: occasionally  . Drug use: No  . Sexual activity: Not Currently  Other Topics Concern  . Not on file  Social History Narrative   She lives in [place], family helps with her care.    FH:  Family History  Problem Relation Age of Onset  . Heart disease Mother   . Heart disease Father   . Heart disease Brother        x 3    Past Medical History:  Diagnosis Date  . AAA (abdominal aortic aneurysm) ([place])   . Atrial fibrillation ([place])   . Chronic systolic heart failure ([place])    a. ECHO (  04/2014) EF 20-25%, diff HK, mild MR  . Coronary artery disease 2007   moderate ASCAD of the left system s/p PCI of the D2 and PCI of the left circ 03/2009  . Hyperlipidemia   . Hypertension   . PVD (peripheral vascular disease) ([place])   . Renal artery stenosis ([place])   . Ventricular dysfunction    left; ischemic    Current Outpatient Medications  Medication Sig Dispense Refill  . acetaminophen (TYLENOL) 500 MG tablet Take 1,000 mg by mouth 3 (three) times daily.    Marland Kitchen amiodarone (PACERONE) 200 MG tablet Take 1 tablet (200 mg total) by mouth daily. (Patient taking differently: Take 100 mg by mouth daily. ) 30 tablet 0  . apixaban (ELIQUIS) 2.5 MG TABS  tablet Take 1 tablet (2.5 mg total) by mouth 2 (two) times daily. 180 tablet 3  . busPIRone (BUSPAR) 5 MG tablet Take 5 mg by mouth daily.     . cefUROXime (CEFTIN) 500 MG tablet Take 1 tablet (500 mg total) by mouth daily. 8 tablet 0  . hydrOXYzine (ATARAX/VISTARIL) 10 MG tablet Take 0.5 tablets (5 mg total) by mouth 3 (three) times daily as needed for itching. 30 tablet 0  . milrinone (PRIMACOR) 20 MG/100 ML SOLN infusion Inject 0.125 mcg/kg/min into the vein continuous. Rate 1.6 ml/hr Weight= 43 kg    . Multiple Vitamin (MULTIVITAMIN) tablet Take 1 tablet by mouth daily. 30 tablet 1  . Omega-3 Fatty Acids (FISH OIL) 1000 MG CAPS Take 1 capsule by mouth 2 (two) times daily.     Marland Kitchen torsemide (DEMADEX) 20 MG tablet Take 2 tablets (40 mg total) by mouth 2 (two) times daily. 60 tablet 0  . traMADol (ULTRAM) 50 MG tablet Take 25 mg by mouth every 8 (eight) hours as needed for moderate pain.     No current facility-administered medications for this encounter.     Vitals:   10/02/17 1132  BP: (!) 143/72  Pulse: 71  SpO2: 98%  Weight: 94 lb (42.6 kg)    Wt Readings from Last 3 Encounters:  10/02/17 94 lb (42.6 kg)  08/03/17 89 lb 3.2 oz (40.5 kg)  07/17/17 89 lb 8 oz (40.6 kg)   PHYSICAL EXAM: General:  Elderly. Frail. No resp difficulty HEENT: normal Neck: supple. JVP 9-10. Carotids 2+ bilat; no bruits. No lymphadenopathy or thryomegaly appreciated. Cor: PMI laterally displaced. Regular rate & rhythm. 2/6 SEM RSB Lungs: clear Abdomen: soft, nontender, nondistended. No hepatosplenomegaly. No bruits or masses. Good bowel sounds. Extremities: no cyanosis, clubbing, rash, edema  RUE PICC  Soft orthopedic walking splint on R ffot Neuro: alert. Follows commands, cranial nerves grossly intact. moves all 4 extremities w/o difficulty. Affect pleasant  ASSESSMENT & PLAN:   1) Chronic Systolic Heart Failure: Mixed ischemic/nonischemic cardiomyopathy (degree of LV dysfunction is out of  proportion to CAD), EF 25% with moderate RV systolic dysfunction .  Echo  8/18 EF 25-30% - Stable NYHA II symptoms and volume status stable on milrinone. Will continue milrinone for palliative care purposes (has failed wean in past) - Volume status stable. Continue torsemide. - Continue HH aide and [facility] support 2) Atrial fibrillation:  - Paroxysmal.  Remains in NSR today.  - Continue apixaban 2.5 mg bid (weight less than 60 kg and age >77). Continue amiodarone 100 mg daily.   3) Limited Code: No CPR or defibrillation 4) CAD: No s/s of ischemia. Off ASA with Eliquis. Intolerant of statins in past.  5) Dementia; - May  be slightly worse today.    Glori Bickers MD  10/02/2017

## 2017-10-06 ENCOUNTER — Encounter (HOSPITAL_COMMUNITY): Payer: Self-pay | Admitting: Emergency Medicine

## 2017-10-06 ENCOUNTER — Emergency Department (HOSPITAL_COMMUNITY)
Admission: EM | Admit: 2017-10-06 | Discharge: 2017-10-06 | Disposition: A | Discharge status: Home / Self Care | Payer: Medicare Other | Attending: Emergency Medicine | Admitting: Emergency Medicine

## 2017-10-06 ENCOUNTER — Other Ambulatory Visit: Payer: Self-pay

## 2017-10-06 DIAGNOSIS — I130 Hypertensive heart and chronic kidney disease with heart failure and stage 1 through stage 4 chronic kidney disease, or unspecified chronic kidney disease: Secondary | ICD-10-CM | POA: Diagnosis not present

## 2017-10-06 DIAGNOSIS — N184 Chronic kidney disease, stage 4 (severe): Secondary | ICD-10-CM | POA: Diagnosis not present

## 2017-10-06 DIAGNOSIS — Z760 Encounter for issue of repeat prescription: Secondary | ICD-10-CM | POA: Insufficient documentation

## 2017-10-06 DIAGNOSIS — Z4800 Encounter for change or removal of nonsurgical wound dressing: Secondary | ICD-10-CM | POA: Diagnosis not present

## 2017-10-06 DIAGNOSIS — I2510 Atherosclerotic heart disease of native coronary artery without angina pectoris: Secondary | ICD-10-CM | POA: Insufficient documentation

## 2017-10-06 DIAGNOSIS — Z7901 Long term (current) use of anticoagulants: Secondary | ICD-10-CM | POA: Insufficient documentation

## 2017-10-06 DIAGNOSIS — I5022 Chronic systolic (congestive) heart failure: Secondary | ICD-10-CM | POA: Insufficient documentation

## 2017-10-06 DIAGNOSIS — Z79899 Other long term (current) drug therapy: Secondary | ICD-10-CM | POA: Insufficient documentation

## 2017-10-06 DIAGNOSIS — I252 Old myocardial infarction: Secondary | ICD-10-CM | POA: Diagnosis not present

## 2017-10-06 NOTE — ED Notes (Signed)
Dr. Tegeler at bedside.  

## 2017-10-06 NOTE — ED Provider Notes (Signed)
[facility] DEPT Provider Note   CSN: [id number] Arrival date & time: 10/06/17  1814     History   Chief Complaint Chief Complaint  Patient presents with  . Dressing Change  . IV Medication    HPI Jacqueline Reilly is a 83 y.o. female.  The history is provided by the patient, a relative and medical records. No language interpreter was used.  Wound Check  This is a chronic problem. The current episode started more than 1 week ago. The problem occurs constantly. The problem has not changed since onset.Pertinent negatives include no chest pain, no abdominal pain, no headaches and no shortness of breath. Nothing aggravates the symptoms. Nothing relieves the symptoms. She has tried nothing for the symptoms. The treatment provided no relief.    Past Medical History:  Diagnosis Date  . AAA (abdominal aortic aneurysm) ([place])   . Atrial fibrillation ([place])   . Chronic systolic heart failure ([place])    a. ECHO (04/2014) EF 20-25%, diff HK, mild MR  . Coronary artery disease 2007   moderate ASCAD of the left system s/p PCI of the D2 and PCI of the left circ 03/2009  . Hyperlipidemia   . Hypertension   . PVD (peripheral vascular disease) ([place])   . Renal artery stenosis ([place])   . Ventricular dysfunction    left; ischemic    Patient Active Problem List   Diagnosis Date Noted  . Pulmonary hypertension ([place]) 08/02/2017  . SIRS (systemic inflammatory response syndrome) ([place]) 05/27/2017  . Acute blood loss anemia 05/27/2017  . Anxiety 05/27/2017  . CKD (chronic kidney disease), stage IV ([place]) 05/27/2017  . Heart failure with reduced ejection fraction ([place]) 04/05/2017  . Receiving inotropic medication 04/05/2017  . Hypokalemia 04/05/2017  . Malnutrition of moderate degree 03/05/2017  . Left displaced femoral neck fracture ([place]) 03/04/2017  . Leukocytosis 03/04/2017  . Hypoxia   . Gram-negative bacteremia 09/01/2015  . Chronic anemia 09/01/2015  .  Persistent atrial fibrillation ([place])   . Acute on chronic systolic CHF (congestive heart failure) ([place]) 06/21/2015  . Atrial fibrillation ([place]) 08/10/2014  . Physical deconditioning 05/26/2014  . Acute respiratory failure with hypoxia ([place]) 05/08/2014  . NSTEMI, initial episode of care ([place]) 05/07/2014  . Atrial fibrillation with rapid ventricular response ([place]) 05/07/2014  . NSTEMI (non-ST elevated myocardial infarction) ([place]) 05/07/2014  . Abnormal chest x-ray 04/24/2014  . CHF (congestive heart failure) ([place]) 04/23/2014  . Acute on chronic combined systolic and diastolic congestive heart failure ([place]) 04/23/2014  . AAA (abdominal aortic aneurysm) ([place]) 10/07/2013  . SOB (shortness of breath) 09/30/2013  . Coronary artery disease   . Ischemic dilated cardiomyopathy ([place])   . Hypertension     Past Surgical History:  Procedure Laterality Date  . ANGIOPLASTY    . ANTERIOR APPROACH HEMI HIP ARTHROPLASTY Left 03/05/2017   Procedure: ANTERIOR APPROACH LEFT HIP HEMI ARTHROPLASTY;  Surgeon: Leandrew Koyanagi, MD;  Location: [facility];  Service: Orthopedics;  Laterality: Left;  . CORONARY STENT PLACEMENT    . IR GENERIC HISTORICAL  09/25/2016   IR FLUORO GUIDE CV LINE RIGHT 09/25/2016 Ardis Rowan, PA-C [facility]-INTERV RAD  . retinal cryopexy     right eye (for retinal detachment)    OB History    No data available       Home Medications    Prior to Admission medications   Medication Sig Start Date End Date Taking? Authorizing Provider  acetaminophen (TYLENOL) 500 MG tablet Take  1,000 mg by mouth 3 (three) times daily.    [provider]  amiodarone (PACERONE) 200 MG tablet Take 1 tablet (200 mg total) by mouth daily. Patient taking differently: Take 100 mg by mouth daily.  06/03/17   Donne Hazel, MD  apixaban (ELIQUIS) 2.5 MG TABS tablet Take 1 tablet (2.5 mg total) by mouth 2 (two) times daily. 11/22/16   Bensimhon, Shaune Pascal, MD  busPIRone (BUSPAR) 5 MG tablet Take 5 mg by  mouth daily.     [provider]  cefUROXime (CEFTIN) 500 MG tablet Take 1 tablet (500 mg total) by mouth daily. 06/01/17   Donne Hazel, MD  hydrOXYzine (ATARAX/VISTARIL) 10 MG tablet Take 0.5 tablets (5 mg total) by mouth 3 (three) times daily as needed for itching. 08/03/17   Caren Griffins, MD  milrinone (PRIMACOR) 20 MG/100 ML SOLN infusion Inject 0.125 mcg/kg/min into the vein continuous. Rate 1.6 ml/hr Weight= 43 kg    [provider]  Multiple Vitamin (MULTIVITAMIN) tablet Take 1 tablet by mouth daily. 07/26/14   Larey Dresser, MD  Omega-3 Fatty Acids (FISH OIL) 1000 MG CAPS Take 1 capsule by mouth 2 (two) times daily.     [provider]  torsemide (DEMADEX) 20 MG tablet Take 2 tablets (40 mg total) by mouth 2 (two) times daily. 06/01/17   Donne Hazel, MD  traMADol (ULTRAM) 50 MG tablet Take 25 mg by mouth every 8 (eight) hours as needed for moderate pain.    [provider]    Family History Family History  Problem Relation Age of Onset  . Heart disease Mother   . Heart disease Father   . Heart disease Brother        x 3    Social History Social History   Tobacco Use  . Smoking status: Never Smoker  . Smokeless tobacco: Never Used  Substance Use Topics  . Alcohol use: Yes    Comment: occasionally  . Drug use: No     Allergies   Codeine and Garlic   Review of Systems Review of Systems  Constitutional: Negative for chills, diaphoresis, fatigue and fever.  HENT: Negative for congestion.   Respiratory: Negative for chest tightness, shortness of breath and wheezing.   Cardiovascular: Negative for chest pain and palpitations.  Gastrointestinal: Negative for abdominal pain, constipation, diarrhea, nausea and vomiting.  Genitourinary: Negative for dysuria.  Musculoskeletal: Negative for back pain, neck pain and neck stiffness.  Neurological: Negative for headaches.  Psychiatric/Behavioral: Negative for agitation.  All other  systems reviewed and are negative.    Physical Exam Updated Vital Signs BP 128/67 (BP Location: Left Arm)   Pulse 72   Temp 98.7 F (37.1 C) (Oral)   Resp 18   Ht 4\' 11"  (1.499 m)   Wt 42.6 kg (94 lb)   SpO2 97%   BMI 18.99 kg/m   Physical Exam  Constitutional: She appears well-developed and well-nourished. No distress.  HENT:  Head: Normocephalic.  Mouth/Throat: Oropharynx is clear and moist.  Eyes: Conjunctivae and EOM are normal. Pupils are equal, round, and reactive to light.  Neck: Normal range of motion.  Cardiovascular: Normal rate and intact distal pulses.  Murmur heard. Pulmonary/Chest: Effort normal. No respiratory distress. She has no wheezes. She has no rales. She exhibits no tenderness.  Abdominal: Soft. She exhibits no distension. There is no tenderness.  Musculoskeletal: She exhibits no tenderness.  Neurological: No sensory deficit. She exhibits normal muscle tone.  Skin: Capillary refill takes less than 2 seconds. No rash noted. She is not diaphoretic. No erythema.  Psychiatric: She has a normal mood and affect.  Nursing note and vitals reviewed.    ED Treatments / Results  Labs (all labs ordered are listed, but only abnormal results are displayed) Labs Reviewed - No data to display  EKG  EKG Interpretation None       Radiology No results found.  Procedures Procedures (including critical care time)  Medications Ordered in ED Medications - No data to display   Initial Impression / Assessment and Plan / ED Course  I have reviewed the triage vital signs and the nursing notes.  Pertinent labs & imaging results that were available during my care of the patient were reviewed by me and considered in my medical decision making (see chart for details).     Jacqueline Reilly is a 83 y.o. female with a past medical history significant for AAA, A. fib with RVR on Eliquis, and CHF on constant milrinone therapy who presents for medication refill and  wound management.  Patient reports that due to incliment weather, her home health nurse was unable to come refill the milrinone and change the PICC line dressing.  According to the son who accompanied patient, patient has an approximately 90 minutes left on her milrinone before she will be out.  The patient needs this to manage her CHF.  Patient denied any preceding symptoms and has had no fevers, chills, fatigue, chest pain, shortness breath, nausea, vomiting, consultation, diarrhea, dysuria.  No physical complaints on arrival.  Next  On exam, patient has a milrinone pump that is nearly empty.  She also has a well-appearing PICC line in her right upper extremity that has no surrounding tenderness or erythema.  No drainage.  Patient's lungs are clear and chest is nontender.  Patient otherwise appears well.  Next  Nursing was able to refill the milrinone pump and change the dressing.  Patient had no problems or comp occasions with this procedure.  Patient will be discharged to follow-up with her PCP and her cardiology team.  Return precautions were given and understood if the pump at problems or the wound dressing needed exchange.  Patient and family had no other questions or concerns and was discharged in good condition.   Final Clinical Impressions(s) / ED Diagnoses   Final diagnoses:  Dressing change  Medication refill    ED Discharge Orders    None      Clinical Impression: 1. Dressing change   2. Medication refill     Disposition: Discharge  Condition: Good  I have discussed the results, Dx and Tx plan with the pt(& family if present). He/she/they expressed understanding and agree(s) with the plan. Discharge instructions discussed at great length. Strict return precautions discussed and pt &/or family have verbalized understanding of the instructions. No further questions at time of discharge.    This SmartLink is deprecated. Use AVSMEDLIST instead to display the medication list  for a patient.  Follow Up: [name], MD [address] [place] [address] [place] DEPT [place] [id number] mc [address] [place] [phone]  If symptoms worsen     Durenda Pechacek, Gwenyth Allegra, MD 10/06/17 [phone]

## 2017-10-06 NOTE — ED Triage Notes (Addendum)
Pt's son brought her in and states home health nurse was unable to come today and the doctors offices are closed so he brought her in to get the dressing to her PIC line changed and her milrinone, a medication used in pt's with heart failure, changed

## 2017-10-06 NOTE — ED Notes (Signed)
Writer attempted to dress patient in gown but per family, "patient does not need to put a gown on just for her bandage to be changed. Procedure can be completed without a gown because patient is in short sleeve shirt".

## 2017-10-06 NOTE — Discharge Instructions (Signed)
Please follow-up with your primary doctor for further management.  If there are any problems with the pump or your dressing, please return to the nearest emergency department.

## 2017-10-08 ENCOUNTER — Other Ambulatory Visit (HOSPITAL_COMMUNITY): Payer: Self-pay | Admitting: Internal Medicine

## 2017-10-08 NOTE — Telephone Encounter (Signed)
This encounter was created in error - please disregard.

## 2017-10-09 ENCOUNTER — Other Ambulatory Visit: Payer: Self-pay

## 2017-10-09 NOTE — Patient Outreach (Signed)
Jacqueline Reilly) Care Management  10/09/2017  Jacqueline Reilly Oct 22, 1924 [id number]   1st Telephone call to patient for ED Utilization screening.  No answer.  HIPAA complaint voice message left with contact information.    Plan RN Health coach will make an outreach attempt the patient in three business days.  Lazaro Arms RN, BSN, [place] Direct Dial:  [phone] Fax: [phone]

## 2017-10-09 NOTE — Patient Outreach (Signed)
[facility]) Care Management  10/09/2017  Jacqueline Reilly [birth year] [id number]   "[facility] Care Management criteria for ED census screening calls is 6 ED visits in 6 months. This patient does not meet this criteria, case is being closed."   Lazaro Arms RN, BSN, [place]:  [phone] Fax: [phone]

## 2017-10-13 DIAGNOSIS — N184 Chronic kidney disease, stage 4 (severe): Secondary | ICD-10-CM | POA: Diagnosis not present

## 2017-10-13 DIAGNOSIS — I5042 Chronic combined systolic (congestive) and diastolic (congestive) heart failure: Secondary | ICD-10-CM | POA: Diagnosis not present

## 2017-10-13 DIAGNOSIS — I130 Hypertensive heart and chronic kidney disease with heart failure and stage 1 through stage 4 chronic kidney disease, or unspecified chronic kidney disease: Secondary | ICD-10-CM | POA: Diagnosis not present

## 2017-10-13 DIAGNOSIS — S32302D Unspecified fracture of left ilium, subsequent encounter for fracture with routine healing: Secondary | ICD-10-CM | POA: Diagnosis not present

## 2017-10-13 DIAGNOSIS — I420 Dilated cardiomyopathy: Secondary | ICD-10-CM | POA: Diagnosis not present

## 2017-10-13 DIAGNOSIS — D631 Anemia in chronic kidney disease: Secondary | ICD-10-CM | POA: Diagnosis not present

## 2017-10-13 DIAGNOSIS — I481 Persistent atrial fibrillation: Secondary | ICD-10-CM | POA: Diagnosis not present

## 2017-10-14 ENCOUNTER — Ambulatory Visit: Payer: Self-pay

## 2017-10-15 DIAGNOSIS — I481 Persistent atrial fibrillation: Secondary | ICD-10-CM | POA: Diagnosis not present

## 2017-10-15 DIAGNOSIS — D631 Anemia in chronic kidney disease: Secondary | ICD-10-CM | POA: Diagnosis not present

## 2017-10-15 DIAGNOSIS — S32302D Unspecified fracture of left ilium, subsequent encounter for fracture with routine healing: Secondary | ICD-10-CM | POA: Diagnosis not present

## 2017-10-15 DIAGNOSIS — I5042 Chronic combined systolic (congestive) and diastolic (congestive) heart failure: Secondary | ICD-10-CM | POA: Diagnosis not present

## 2017-10-15 DIAGNOSIS — I130 Hypertensive heart and chronic kidney disease with heart failure and stage 1 through stage 4 chronic kidney disease, or unspecified chronic kidney disease: Secondary | ICD-10-CM | POA: Diagnosis not present

## 2017-10-15 DIAGNOSIS — N184 Chronic kidney disease, stage 4 (severe): Secondary | ICD-10-CM | POA: Diagnosis not present

## 2017-10-19 DIAGNOSIS — Z9181 History of falling: Secondary | ICD-10-CM | POA: Diagnosis not present

## 2017-10-19 DIAGNOSIS — S32302D Unspecified fracture of left ilium, subsequent encounter for fracture with routine healing: Secondary | ICD-10-CM | POA: Diagnosis not present

## 2017-10-19 DIAGNOSIS — Z7901 Long term (current) use of anticoagulants: Secondary | ICD-10-CM | POA: Diagnosis not present

## 2017-10-19 DIAGNOSIS — I130 Hypertensive heart and chronic kidney disease with heart failure and stage 1 through stage 4 chronic kidney disease, or unspecified chronic kidney disease: Secondary | ICD-10-CM | POA: Diagnosis not present

## 2017-10-19 DIAGNOSIS — F419 Anxiety disorder, unspecified: Secondary | ICD-10-CM | POA: Diagnosis not present

## 2017-10-19 DIAGNOSIS — E43 Unspecified severe protein-calorie malnutrition: Secondary | ICD-10-CM | POA: Diagnosis not present

## 2017-10-19 DIAGNOSIS — I5042 Chronic combined systolic (congestive) and diastolic (congestive) heart failure: Secondary | ICD-10-CM | POA: Diagnosis not present

## 2017-10-19 DIAGNOSIS — I252 Old myocardial infarction: Secondary | ICD-10-CM | POA: Diagnosis not present

## 2017-10-19 DIAGNOSIS — D631 Anemia in chronic kidney disease: Secondary | ICD-10-CM | POA: Diagnosis not present

## 2017-10-19 DIAGNOSIS — I420 Dilated cardiomyopathy: Secondary | ICD-10-CM | POA: Diagnosis not present

## 2017-10-19 DIAGNOSIS — Z9981 Dependence on supplemental oxygen: Secondary | ICD-10-CM | POA: Diagnosis not present

## 2017-10-19 DIAGNOSIS — I481 Persistent atrial fibrillation: Secondary | ICD-10-CM | POA: Diagnosis not present

## 2017-10-19 DIAGNOSIS — J9690 Respiratory failure, unspecified, unspecified whether with hypoxia or hypercapnia: Secondary | ICD-10-CM | POA: Diagnosis not present

## 2017-10-19 DIAGNOSIS — Z96642 Presence of left artificial hip joint: Secondary | ICD-10-CM | POA: Diagnosis not present

## 2017-10-19 DIAGNOSIS — Z452 Encounter for adjustment and management of vascular access device: Secondary | ICD-10-CM | POA: Diagnosis not present

## 2017-10-19 DIAGNOSIS — E782 Mixed hyperlipidemia: Secondary | ICD-10-CM | POA: Diagnosis not present

## 2017-10-19 DIAGNOSIS — Z79891 Long term (current) use of opiate analgesic: Secondary | ICD-10-CM | POA: Diagnosis not present

## 2017-10-19 DIAGNOSIS — I2510 Atherosclerotic heart disease of native coronary artery without angina pectoris: Secondary | ICD-10-CM | POA: Diagnosis not present

## 2017-10-19 DIAGNOSIS — F0390 Unspecified dementia without behavioral disturbance: Secondary | ICD-10-CM | POA: Diagnosis not present

## 2017-10-19 DIAGNOSIS — N184 Chronic kidney disease, stage 4 (severe): Secondary | ICD-10-CM | POA: Diagnosis not present

## 2017-10-19 DIAGNOSIS — I739 Peripheral vascular disease, unspecified: Secondary | ICD-10-CM | POA: Diagnosis not present

## 2017-10-20 DIAGNOSIS — N184 Chronic kidney disease, stage 4 (severe): Secondary | ICD-10-CM | POA: Diagnosis not present

## 2017-10-20 DIAGNOSIS — I739 Peripheral vascular disease, unspecified: Secondary | ICD-10-CM | POA: Diagnosis not present

## 2017-10-20 DIAGNOSIS — I420 Dilated cardiomyopathy: Secondary | ICD-10-CM | POA: Diagnosis not present

## 2017-10-20 DIAGNOSIS — I5042 Chronic combined systolic (congestive) and diastolic (congestive) heart failure: Secondary | ICD-10-CM | POA: Diagnosis not present

## 2017-10-20 DIAGNOSIS — I481 Persistent atrial fibrillation: Secondary | ICD-10-CM | POA: Diagnosis not present

## 2017-10-20 DIAGNOSIS — I130 Hypertensive heart and chronic kidney disease with heart failure and stage 1 through stage 4 chronic kidney disease, or unspecified chronic kidney disease: Secondary | ICD-10-CM | POA: Diagnosis not present

## 2017-10-20 DIAGNOSIS — D631 Anemia in chronic kidney disease: Secondary | ICD-10-CM | POA: Diagnosis not present

## 2017-10-20 DIAGNOSIS — S32302D Unspecified fracture of left ilium, subsequent encounter for fracture with routine healing: Secondary | ICD-10-CM | POA: Diagnosis not present

## 2017-10-22 DIAGNOSIS — I5042 Chronic combined systolic (congestive) and diastolic (congestive) heart failure: Secondary | ICD-10-CM | POA: Diagnosis not present

## 2017-10-22 DIAGNOSIS — I481 Persistent atrial fibrillation: Secondary | ICD-10-CM | POA: Diagnosis not present

## 2017-10-22 DIAGNOSIS — I130 Hypertensive heart and chronic kidney disease with heart failure and stage 1 through stage 4 chronic kidney disease, or unspecified chronic kidney disease: Secondary | ICD-10-CM | POA: Diagnosis not present

## 2017-10-22 DIAGNOSIS — N184 Chronic kidney disease, stage 4 (severe): Secondary | ICD-10-CM | POA: Diagnosis not present

## 2017-10-22 DIAGNOSIS — D631 Anemia in chronic kidney disease: Secondary | ICD-10-CM | POA: Diagnosis not present

## 2017-10-22 DIAGNOSIS — S32302D Unspecified fracture of left ilium, subsequent encounter for fracture with routine healing: Secondary | ICD-10-CM | POA: Diagnosis not present

## 2017-10-24 ENCOUNTER — Other Ambulatory Visit (HOSPITAL_COMMUNITY): Payer: Self-pay | Admitting: Internal Medicine

## 2017-10-24 DIAGNOSIS — D631 Anemia in chronic kidney disease: Secondary | ICD-10-CM | POA: Diagnosis not present

## 2017-10-24 DIAGNOSIS — I481 Persistent atrial fibrillation: Secondary | ICD-10-CM | POA: Diagnosis not present

## 2017-10-24 DIAGNOSIS — I5042 Chronic combined systolic (congestive) and diastolic (congestive) heart failure: Secondary | ICD-10-CM | POA: Diagnosis not present

## 2017-10-24 DIAGNOSIS — S32302D Unspecified fracture of left ilium, subsequent encounter for fracture with routine healing: Secondary | ICD-10-CM | POA: Diagnosis not present

## 2017-10-24 DIAGNOSIS — N184 Chronic kidney disease, stage 4 (severe): Secondary | ICD-10-CM | POA: Diagnosis not present

## 2017-10-24 DIAGNOSIS — I130 Hypertensive heart and chronic kidney disease with heart failure and stage 1 through stage 4 chronic kidney disease, or unspecified chronic kidney disease: Secondary | ICD-10-CM | POA: Diagnosis not present

## 2017-10-27 ENCOUNTER — Other Ambulatory Visit (HOSPITAL_COMMUNITY)
Admit: 2017-10-27 | Discharge: 2017-10-27 | Disposition: A | Discharge status: Home / Self Care | Payer: Medicare Other | Attending: Internal Medicine | Admitting: Internal Medicine

## 2017-10-27 DIAGNOSIS — D631 Anemia in chronic kidney disease: Secondary | ICD-10-CM | POA: Diagnosis not present

## 2017-10-27 DIAGNOSIS — I130 Hypertensive heart and chronic kidney disease with heart failure and stage 1 through stage 4 chronic kidney disease, or unspecified chronic kidney disease: Secondary | ICD-10-CM | POA: Insufficient documentation

## 2017-10-27 DIAGNOSIS — S32302D Unspecified fracture of left ilium, subsequent encounter for fracture with routine healing: Secondary | ICD-10-CM | POA: Diagnosis not present

## 2017-10-27 DIAGNOSIS — I5042 Chronic combined systolic (congestive) and diastolic (congestive) heart failure: Secondary | ICD-10-CM | POA: Diagnosis not present

## 2017-10-27 DIAGNOSIS — I481 Persistent atrial fibrillation: Secondary | ICD-10-CM | POA: Diagnosis not present

## 2017-10-27 DIAGNOSIS — N184 Chronic kidney disease, stage 4 (severe): Secondary | ICD-10-CM | POA: Diagnosis not present

## 2017-10-27 LAB — BASIC METABOLIC PANEL
Anion gap: 9 (ref 5–15)
BUN: 28 mg/dL — ABNORMAL HIGH (ref 6–20)
CO2: 28 mmol/L (ref 22–32)
Calcium: 8.9 mg/dL (ref 8.9–10.3)
Chloride: 102 mmol/L (ref 101–111)
Creatinine, Ser: 1.22 mg/dL — ABNORMAL HIGH (ref 0.44–1.00)
GFR calc Af Amer: 44 mL/min — ABNORMAL LOW (ref >60)
GFR calc non Af Amer: 38 mL/min — ABNORMAL LOW (ref >60)
Glucose, Bld: 95 mg/dL (ref 65–99)
Potassium: 3.9 mmol/L (ref 3.5–5.1)
Sodium: 139 mmol/L (ref 135–145)

## 2017-10-27 LAB — CBC WITH DIFFERENTIAL/PLATELET
Basophils Absolute: 0 10*3/uL (ref 0.0–0.1)
Basophils Relative: 0 %
Eosinophils Absolute: 0.2 10*3/uL (ref 0.0–0.7)
Eosinophils Relative: 3 %
HCT: 37.8 % (ref 36.0–46.0)
Hemoglobin: 12 g/dL (ref 12.0–15.0)
Lymphocytes Relative: 31 %
Lymphs Abs: 1.9 10*3/uL (ref 0.7–4.0)
MCH: 28.6 pg (ref 26.0–34.0)
MCHC: 31.7 g/dL (ref 30.0–36.0)
MCV: 90 fL (ref 78.0–100.0)
Monocytes Absolute: 0.5 10*3/uL (ref 0.1–1.0)
Monocytes Relative: 8 %
Neutro Abs: 3.5 10*3/uL (ref 1.7–7.7)
Neutrophils Relative %: 58 %
Platelets: 272 10*3/uL (ref 150–400)
RBC: 4.2 MIL/uL (ref 3.87–5.11)
RDW: 14.5 % (ref 11.5–15.5)
WBC: 6.1 10*3/uL (ref 4.0–10.5)

## 2017-10-27 LAB — MAGNESIUM: Magnesium: 2.4 mg/dL (ref 1.7–2.4)

## 2017-10-30 DIAGNOSIS — L309 Dermatitis, unspecified: Secondary | ICD-10-CM | POA: Diagnosis not present

## 2017-10-31 ENCOUNTER — Other Ambulatory Visit (HOSPITAL_COMMUNITY): Payer: Self-pay | Admitting: Internal Medicine

## 2017-10-31 ENCOUNTER — Encounter (HOSPITAL_COMMUNITY): Payer: Self-pay

## 2017-11-03 DIAGNOSIS — D631 Anemia in chronic kidney disease: Secondary | ICD-10-CM | POA: Diagnosis not present

## 2017-11-03 DIAGNOSIS — I130 Hypertensive heart and chronic kidney disease with heart failure and stage 1 through stage 4 chronic kidney disease, or unspecified chronic kidney disease: Secondary | ICD-10-CM | POA: Diagnosis not present

## 2017-11-03 DIAGNOSIS — S32302D Unspecified fracture of left ilium, subsequent encounter for fracture with routine healing: Secondary | ICD-10-CM | POA: Diagnosis not present

## 2017-11-03 DIAGNOSIS — N184 Chronic kidney disease, stage 4 (severe): Secondary | ICD-10-CM | POA: Diagnosis not present

## 2017-11-03 DIAGNOSIS — I5042 Chronic combined systolic (congestive) and diastolic (congestive) heart failure: Secondary | ICD-10-CM | POA: Diagnosis not present

## 2017-11-03 DIAGNOSIS — I481 Persistent atrial fibrillation: Secondary | ICD-10-CM | POA: Diagnosis not present

## 2017-11-10 DIAGNOSIS — I130 Hypertensive heart and chronic kidney disease with heart failure and stage 1 through stage 4 chronic kidney disease, or unspecified chronic kidney disease: Secondary | ICD-10-CM | POA: Diagnosis not present

## 2017-11-10 DIAGNOSIS — I509 Heart failure, unspecified: Secondary | ICD-10-CM | POA: Diagnosis not present

## 2017-11-10 DIAGNOSIS — N184 Chronic kidney disease, stage 4 (severe): Secondary | ICD-10-CM | POA: Diagnosis not present

## 2017-11-10 DIAGNOSIS — I10 Essential (primary) hypertension: Secondary | ICD-10-CM | POA: Diagnosis not present

## 2017-11-10 DIAGNOSIS — D631 Anemia in chronic kidney disease: Secondary | ICD-10-CM | POA: Diagnosis not present

## 2017-11-10 DIAGNOSIS — S32302D Unspecified fracture of left ilium, subsequent encounter for fracture with routine healing: Secondary | ICD-10-CM | POA: Diagnosis not present

## 2017-11-10 DIAGNOSIS — I481 Persistent atrial fibrillation: Secondary | ICD-10-CM | POA: Diagnosis not present

## 2017-11-10 DIAGNOSIS — I5042 Chronic combined systolic (congestive) and diastolic (congestive) heart failure: Secondary | ICD-10-CM | POA: Diagnosis not present

## 2017-11-13 ENCOUNTER — Other Ambulatory Visit: Payer: Self-pay | Admitting: Nurse Practitioner

## 2017-11-13 ENCOUNTER — Ambulatory Visit
Admission: RE | Admit: 2017-11-13 | Discharge: 2017-11-13 | Disposition: A | Discharge status: Home / Self Care | Payer: Medicare Other | Source: Ambulatory Visit | Attending: Nurse Practitioner | Admitting: Nurse Practitioner

## 2017-11-13 DIAGNOSIS — S60221A Contusion of right hand, initial encounter: Secondary | ICD-10-CM

## 2017-11-17 DIAGNOSIS — I481 Persistent atrial fibrillation: Secondary | ICD-10-CM | POA: Diagnosis not present

## 2017-11-17 DIAGNOSIS — S32302D Unspecified fracture of left ilium, subsequent encounter for fracture with routine healing: Secondary | ICD-10-CM | POA: Diagnosis not present

## 2017-11-17 DIAGNOSIS — I5042 Chronic combined systolic (congestive) and diastolic (congestive) heart failure: Secondary | ICD-10-CM | POA: Diagnosis not present

## 2017-11-17 DIAGNOSIS — N184 Chronic kidney disease, stage 4 (severe): Secondary | ICD-10-CM | POA: Diagnosis not present

## 2017-11-17 DIAGNOSIS — I130 Hypertensive heart and chronic kidney disease with heart failure and stage 1 through stage 4 chronic kidney disease, or unspecified chronic kidney disease: Secondary | ICD-10-CM | POA: Diagnosis not present

## 2017-11-17 DIAGNOSIS — D631 Anemia in chronic kidney disease: Secondary | ICD-10-CM | POA: Diagnosis not present

## 2017-11-18 ENCOUNTER — Telehealth (HOSPITAL_COMMUNITY): Payer: Self-pay | Admitting: *Deleted

## 2017-11-18 DIAGNOSIS — I130 Hypertensive heart and chronic kidney disease with heart failure and stage 1 through stage 4 chronic kidney disease, or unspecified chronic kidney disease: Secondary | ICD-10-CM | POA: Diagnosis not present

## 2017-11-18 DIAGNOSIS — N184 Chronic kidney disease, stage 4 (severe): Secondary | ICD-10-CM | POA: Diagnosis not present

## 2017-11-18 DIAGNOSIS — D631 Anemia in chronic kidney disease: Secondary | ICD-10-CM | POA: Diagnosis not present

## 2017-11-18 DIAGNOSIS — I5042 Chronic combined systolic (congestive) and diastolic (congestive) heart failure: Secondary | ICD-10-CM | POA: Diagnosis not present

## 2017-11-18 DIAGNOSIS — I481 Persistent atrial fibrillation: Secondary | ICD-10-CM | POA: Diagnosis not present

## 2017-11-18 DIAGNOSIS — S32302D Unspecified fracture of left ilium, subsequent encounter for fracture with routine healing: Secondary | ICD-10-CM | POA: Diagnosis not present

## 2017-11-18 NOTE — Telephone Encounter (Signed)
Received call from [facility] regarding labs drawn by [facility] RN stating potassium was 7.3.  Level is believed to be hemolyzed and stat repeat has been ordered.    I called and spoke with patient's caretaker who stated patient is asymptomatic.  Will wait for repeat labs to result for further instructions.

## 2017-11-19 NOTE — Telephone Encounter (Signed)
Lab results were faxed this morning her Potassium is normal at 4.3. No changes at this time per [facility].

## 2017-11-21 ENCOUNTER — Other Ambulatory Visit (HOSPITAL_COMMUNITY): Payer: Self-pay | Admitting: Internal Medicine

## 2017-11-21 ENCOUNTER — Other Ambulatory Visit (HOSPITAL_COMMUNITY): Payer: Self-pay | Admitting: Vascular Surgery

## 2017-11-24 DIAGNOSIS — N184 Chronic kidney disease, stage 4 (severe): Secondary | ICD-10-CM | POA: Diagnosis not present

## 2017-11-24 DIAGNOSIS — I5042 Chronic combined systolic (congestive) and diastolic (congestive) heart failure: Secondary | ICD-10-CM | POA: Diagnosis not present

## 2017-11-24 DIAGNOSIS — I130 Hypertensive heart and chronic kidney disease with heart failure and stage 1 through stage 4 chronic kidney disease, or unspecified chronic kidney disease: Secondary | ICD-10-CM | POA: Diagnosis not present

## 2017-11-24 DIAGNOSIS — D631 Anemia in chronic kidney disease: Secondary | ICD-10-CM | POA: Diagnosis not present

## 2017-11-24 DIAGNOSIS — S32302D Unspecified fracture of left ilium, subsequent encounter for fracture with routine healing: Secondary | ICD-10-CM | POA: Diagnosis not present

## 2017-11-24 DIAGNOSIS — I481 Persistent atrial fibrillation: Secondary | ICD-10-CM | POA: Diagnosis not present

## 2017-12-01 DIAGNOSIS — I5042 Chronic combined systolic (congestive) and diastolic (congestive) heart failure: Secondary | ICD-10-CM | POA: Diagnosis not present

## 2017-12-01 DIAGNOSIS — D631 Anemia in chronic kidney disease: Secondary | ICD-10-CM | POA: Diagnosis not present

## 2017-12-01 DIAGNOSIS — I481 Persistent atrial fibrillation: Secondary | ICD-10-CM | POA: Diagnosis not present

## 2017-12-01 DIAGNOSIS — I130 Hypertensive heart and chronic kidney disease with heart failure and stage 1 through stage 4 chronic kidney disease, or unspecified chronic kidney disease: Secondary | ICD-10-CM | POA: Diagnosis not present

## 2017-12-01 DIAGNOSIS — S32302D Unspecified fracture of left ilium, subsequent encounter for fracture with routine healing: Secondary | ICD-10-CM | POA: Diagnosis not present

## 2017-12-01 DIAGNOSIS — N184 Chronic kidney disease, stage 4 (severe): Secondary | ICD-10-CM | POA: Diagnosis not present

## 2017-12-04 ENCOUNTER — Other Ambulatory Visit (HOSPITAL_COMMUNITY): Payer: Self-pay | Admitting: Internal Medicine

## 2017-12-08 DIAGNOSIS — D631 Anemia in chronic kidney disease: Secondary | ICD-10-CM | POA: Diagnosis not present

## 2017-12-08 DIAGNOSIS — I481 Persistent atrial fibrillation: Secondary | ICD-10-CM | POA: Diagnosis not present

## 2017-12-08 DIAGNOSIS — I130 Hypertensive heart and chronic kidney disease with heart failure and stage 1 through stage 4 chronic kidney disease, or unspecified chronic kidney disease: Secondary | ICD-10-CM | POA: Diagnosis not present

## 2017-12-08 DIAGNOSIS — I5042 Chronic combined systolic (congestive) and diastolic (congestive) heart failure: Secondary | ICD-10-CM | POA: Diagnosis not present

## 2017-12-08 DIAGNOSIS — N184 Chronic kidney disease, stage 4 (severe): Secondary | ICD-10-CM | POA: Diagnosis not present

## 2017-12-08 DIAGNOSIS — S32302D Unspecified fracture of left ilium, subsequent encounter for fracture with routine healing: Secondary | ICD-10-CM | POA: Diagnosis not present

## 2017-12-11 DIAGNOSIS — B86 Scabies: Secondary | ICD-10-CM | POA: Diagnosis not present

## 2017-12-15 ENCOUNTER — Emergency Department (HOSPITAL_COMMUNITY)
Admission: EM | Admit: 2017-12-15 | Discharge: 2017-12-15 | Disposition: A | Discharge status: Home / Self Care | Payer: Medicare Other | Attending: Emergency Medicine | Admitting: Emergency Medicine

## 2017-12-15 ENCOUNTER — Encounter (HOSPITAL_COMMUNITY): Payer: Self-pay

## 2017-12-15 DIAGNOSIS — I5022 Chronic systolic (congestive) heart failure: Secondary | ICD-10-CM | POA: Diagnosis not present

## 2017-12-15 DIAGNOSIS — Y658 Other specified misadventures during surgical and medical care: Secondary | ICD-10-CM | POA: Diagnosis not present

## 2017-12-15 DIAGNOSIS — I2510 Atherosclerotic heart disease of native coronary artery without angina pectoris: Secondary | ICD-10-CM | POA: Insufficient documentation

## 2017-12-15 DIAGNOSIS — Z7901 Long term (current) use of anticoagulants: Secondary | ICD-10-CM | POA: Insufficient documentation

## 2017-12-15 DIAGNOSIS — Z79899 Other long term (current) drug therapy: Secondary | ICD-10-CM | POA: Insufficient documentation

## 2017-12-15 DIAGNOSIS — I110 Hypertensive heart disease with heart failure: Secondary | ICD-10-CM | POA: Insufficient documentation

## 2017-12-15 DIAGNOSIS — T8090XA Unspecified complication following infusion and therapeutic injection, initial encounter: Secondary | ICD-10-CM | POA: Insufficient documentation

## 2017-12-15 NOTE — ED Provider Notes (Signed)
[facility] DEPT Provider Note   CSN: [id number] Arrival date & time: 12/15/17  2228     History   Chief Complaint No chief complaint on file.   HPI Jacqueline Reilly is a 84 y.o. female.  84 year old female here with needing her PICC infusion back changed.  Has a history of heart failure and is on home milrinone.  Her home health nurse did not show up today.  She has no other complaints.      Past Medical History:  Diagnosis Date  . AAA (abdominal aortic aneurysm) ([place])   . Atrial fibrillation ([place])   . Chronic systolic heart failure ([place])    a. ECHO (04/2014) EF 20-25%, diff HK, mild MR  . Coronary artery disease 2007   moderate ASCAD of the left system s/p PCI of the D2 and PCI of the left circ 03/2009  . Hyperlipidemia   . Hypertension   . PVD (peripheral vascular disease) ([place])   . Renal artery stenosis ([place])   . Ventricular dysfunction    left; ischemic    Patient Active Problem List   Diagnosis Date Noted  . Pulmonary hypertension ([place]) 08/02/2017  . SIRS (systemic inflammatory response syndrome) ([place]) 05/27/2017  . Acute blood loss anemia 05/27/2017  . Anxiety 05/27/2017  . CKD (chronic kidney disease), stage IV ([place]) 05/27/2017  . Heart failure with reduced ejection fraction ([place]) 04/05/2017  . Receiving inotropic medication 04/05/2017  . Hypokalemia 04/05/2017  . Malnutrition of moderate degree 03/05/2017  . Left displaced femoral neck fracture ([place]) 03/04/2017  . Leukocytosis 03/04/2017  . Hypoxia   . Gram-negative bacteremia 09/01/2015  . Chronic anemia 09/01/2015  . Persistent atrial fibrillation ([place])   . Acute on chronic systolic CHF (congestive heart failure) ([place]) 06/21/2015  . Atrial fibrillation ([place]) 08/10/2014  . Physical deconditioning 05/26/2014  . Acute respiratory failure with hypoxia ([place]) 05/08/2014  . NSTEMI, initial episode of care ([place]) 05/07/2014  . Atrial fibrillation with rapid ventricular response  ([place]) 05/07/2014  . NSTEMI (non-ST elevated myocardial infarction) ([place]) 05/07/2014  . Abnormal chest x-ray 04/24/2014  . CHF (congestive heart failure) ([place]) 04/23/2014  . Acute on chronic combined systolic and diastolic congestive heart failure ([place]) 04/23/2014  . AAA (abdominal aortic aneurysm) ([place]) 10/07/2013  . SOB (shortness of breath) 09/30/2013  . Coronary artery disease   . Ischemic dilated cardiomyopathy ([place])   . Hypertension     Past Surgical History:  Procedure Laterality Date  . ANGIOPLASTY    . ANTERIOR APPROACH HEMI HIP ARTHROPLASTY Left 03/05/2017   Procedure: ANTERIOR APPROACH LEFT HIP HEMI ARTHROPLASTY;  Surgeon: Leandrew Koyanagi, MD;  Location: [facility];  Service: Orthopedics;  Laterality: Left;  . CORONARY STENT PLACEMENT    . IR GENERIC HISTORICAL  09/25/2016   IR FLUORO GUIDE CV LINE RIGHT 09/25/2016 Ardis Rowan, PA-C [facility] RAD  . retinal cryopexy     right eye (for retinal detachment)    OB History    No data available       Home Medications    Prior to Admission medications   Medication Sig Start Date End Date Taking? Authorizing Provider  acetaminophen (TYLENOL) 500 MG tablet Take 1,000 mg by mouth 3 (three) times daily.    [provider]  amiodarone (PACERONE) 200 MG tablet Take 1 tablet (200 mg total) by mouth daily. Patient taking differently: Take 100 mg by mouth daily.  06/03/17   Donne Hazel, MD  apixaban (ELIQUIS) 2.5 MG  TABS tablet Take 1 tablet (2.5 mg total) by mouth 2 (two) times daily. 11/22/16   Bensimhon, Shaune Pascal, MD  busPIRone (BUSPAR) 5 MG tablet Take 5 mg by mouth daily.     [provider]  cefUROXime (CEFTIN) 500 MG tablet Take 1 tablet (500 mg total) by mouth daily. 06/01/17   Donne Hazel, MD  hydrOXYzine (ATARAX/VISTARIL) 10 MG tablet Take 0.5 tablets (5 mg total) by mouth 3 (three) times daily as needed for itching. 08/03/17   Caren Griffins, MD  milrinone (PRIMACOR) 20 MG/100 ML SOLN infusion  Inject 0.125 mcg/kg/min into the vein continuous. Rate 1.6 ml/hr Weight= 43 kg    [provider]  Multiple Vitamin (MULTIVITAMIN) tablet Take 1 tablet by mouth daily. 07/26/14   Larey Dresser, MD  Omega-3 Fatty Acids (FISH OIL) 1000 MG CAPS Take 1 capsule by mouth 2 (two) times daily.     [provider]  torsemide (DEMADEX) 20 MG tablet Take 2 tablets (40 mg total) by mouth 2 (two) times daily. 06/01/17   Donne Hazel, MD  traMADol (ULTRAM) 50 MG tablet Take 25 mg by mouth every 8 (eight) hours as needed for moderate pain.    [provider]    Family History Family History  Problem Relation Age of Onset  . Heart disease Mother   . Heart disease Father   . Heart disease Brother        x 3    Social History Social History   Tobacco Use  . Smoking status: Never Smoker  . Smokeless tobacco: Never Used  Substance Use Topics  . Alcohol use: Yes    Comment: occasionally  . Drug use: No     Allergies   Codeine and Garlic   Review of Systems Review of Systems  All other systems reviewed and are negative.    Physical Exam Updated Vital Signs BP 129/65 (BP Location: Left Arm)   Pulse 65   Temp (!) 97.4 F (36.3 C) (Oral)   Resp 18   SpO2 100%   Physical Exam  Constitutional: She is oriented to person, place, and time. She appears well-developed and well-nourished.  Non-toxic appearance.  HENT:  Head: Normocephalic and atraumatic.  Eyes: Conjunctivae are normal. Pupils are equal, round, and reactive to light.  Neck: Normal range of motion.  Cardiovascular: Normal rate.  Pulmonary/Chest: Effort normal.  Neurological: She is alert and oriented to person, place, and time.  Skin: Skin is warm and dry.  Psychiatric: She has a normal mood and affect.  Nursing note and vitals reviewed.    ED Treatments / Results  Labs (all labs ordered are listed, but only abnormal results are displayed) Labs Reviewed - No data to display  EKG  EKG  Interpretation None       Radiology No results found.  Procedures Procedures (including critical care time)  Medications Ordered in ED Medications - No data to display   Initial Impression / Assessment and Plan / ED Course  I have reviewed the triage vital signs and the nursing notes.  Pertinent labs & imaging results that were available during my care of the patient were reviewed by me and considered in my medical decision making (see chart for details).   infusion bag changed by nursing  Final Clinical Impressions(s) / ED Diagnoses   Final diagnoses:  Complication of infusion, initial encounter    ED Discharge Orders    None  Lacretia Leigh, MD 12/17/17 [phone]

## 2017-12-15 NOTE — Discharge Instructions (Signed)
Follow-up with your doctor as directed 

## 2017-12-15 NOTE — ED Triage Notes (Signed)
Pt usually has her PICC infusion changed every Monday and they didn't come out today, pt's bag was changed and IV team called to double check Pt has no other complaints

## 2017-12-16 DIAGNOSIS — I130 Hypertensive heart and chronic kidney disease with heart failure and stage 1 through stage 4 chronic kidney disease, or unspecified chronic kidney disease: Secondary | ICD-10-CM | POA: Diagnosis not present

## 2017-12-16 DIAGNOSIS — I5042 Chronic combined systolic (congestive) and diastolic (congestive) heart failure: Secondary | ICD-10-CM | POA: Diagnosis not present

## 2017-12-16 DIAGNOSIS — N184 Chronic kidney disease, stage 4 (severe): Secondary | ICD-10-CM | POA: Diagnosis not present

## 2017-12-16 DIAGNOSIS — S32302D Unspecified fracture of left ilium, subsequent encounter for fracture with routine healing: Secondary | ICD-10-CM | POA: Diagnosis not present

## 2017-12-16 DIAGNOSIS — D631 Anemia in chronic kidney disease: Secondary | ICD-10-CM | POA: Diagnosis not present

## 2017-12-16 DIAGNOSIS — I481 Persistent atrial fibrillation: Secondary | ICD-10-CM | POA: Diagnosis not present

## 2017-12-17 DIAGNOSIS — B86 Scabies: Secondary | ICD-10-CM | POA: Diagnosis not present

## 2017-12-17 DIAGNOSIS — I5020 Unspecified systolic (congestive) heart failure: Secondary | ICD-10-CM | POA: Diagnosis not present

## 2017-12-18 DIAGNOSIS — I5042 Chronic combined systolic (congestive) and diastolic (congestive) heart failure: Secondary | ICD-10-CM | POA: Diagnosis not present

## 2017-12-18 DIAGNOSIS — E782 Mixed hyperlipidemia: Secondary | ICD-10-CM | POA: Diagnosis not present

## 2017-12-18 DIAGNOSIS — Z79891 Long term (current) use of opiate analgesic: Secondary | ICD-10-CM | POA: Diagnosis not present

## 2017-12-18 DIAGNOSIS — I130 Hypertensive heart and chronic kidney disease with heart failure and stage 1 through stage 4 chronic kidney disease, or unspecified chronic kidney disease: Secondary | ICD-10-CM | POA: Diagnosis not present

## 2017-12-18 DIAGNOSIS — J9690 Respiratory failure, unspecified, unspecified whether with hypoxia or hypercapnia: Secondary | ICD-10-CM | POA: Diagnosis not present

## 2017-12-18 DIAGNOSIS — D631 Anemia in chronic kidney disease: Secondary | ICD-10-CM | POA: Diagnosis not present

## 2017-12-18 DIAGNOSIS — F0390 Unspecified dementia without behavioral disturbance: Secondary | ICD-10-CM | POA: Diagnosis not present

## 2017-12-18 DIAGNOSIS — I252 Old myocardial infarction: Secondary | ICD-10-CM | POA: Diagnosis not present

## 2017-12-18 DIAGNOSIS — F419 Anxiety disorder, unspecified: Secondary | ICD-10-CM | POA: Diagnosis not present

## 2017-12-18 DIAGNOSIS — I739 Peripheral vascular disease, unspecified: Secondary | ICD-10-CM | POA: Diagnosis not present

## 2017-12-18 DIAGNOSIS — I481 Persistent atrial fibrillation: Secondary | ICD-10-CM | POA: Diagnosis not present

## 2017-12-18 DIAGNOSIS — Z9181 History of falling: Secondary | ICD-10-CM | POA: Diagnosis not present

## 2017-12-18 DIAGNOSIS — I2510 Atherosclerotic heart disease of native coronary artery without angina pectoris: Secondary | ICD-10-CM | POA: Diagnosis not present

## 2017-12-18 DIAGNOSIS — Z9981 Dependence on supplemental oxygen: Secondary | ICD-10-CM | POA: Diagnosis not present

## 2017-12-18 DIAGNOSIS — Z96642 Presence of left artificial hip joint: Secondary | ICD-10-CM | POA: Diagnosis not present

## 2017-12-18 DIAGNOSIS — Z452 Encounter for adjustment and management of vascular access device: Secondary | ICD-10-CM | POA: Diagnosis not present

## 2017-12-18 DIAGNOSIS — I420 Dilated cardiomyopathy: Secondary | ICD-10-CM | POA: Diagnosis not present

## 2017-12-18 DIAGNOSIS — Z7901 Long term (current) use of anticoagulants: Secondary | ICD-10-CM | POA: Diagnosis not present

## 2017-12-18 DIAGNOSIS — E43 Unspecified severe protein-calorie malnutrition: Secondary | ICD-10-CM | POA: Diagnosis not present

## 2017-12-18 DIAGNOSIS — N184 Chronic kidney disease, stage 4 (severe): Secondary | ICD-10-CM | POA: Diagnosis not present

## 2017-12-19 ENCOUNTER — Other Ambulatory Visit (HOSPITAL_COMMUNITY): Payer: Self-pay | Admitting: Internal Medicine

## 2017-12-22 DIAGNOSIS — I130 Hypertensive heart and chronic kidney disease with heart failure and stage 1 through stage 4 chronic kidney disease, or unspecified chronic kidney disease: Secondary | ICD-10-CM | POA: Diagnosis not present

## 2017-12-22 DIAGNOSIS — J9690 Respiratory failure, unspecified, unspecified whether with hypoxia or hypercapnia: Secondary | ICD-10-CM | POA: Diagnosis not present

## 2017-12-22 DIAGNOSIS — I5042 Chronic combined systolic (congestive) and diastolic (congestive) heart failure: Secondary | ICD-10-CM | POA: Diagnosis not present

## 2017-12-22 DIAGNOSIS — D631 Anemia in chronic kidney disease: Secondary | ICD-10-CM | POA: Diagnosis not present

## 2017-12-22 DIAGNOSIS — I481 Persistent atrial fibrillation: Secondary | ICD-10-CM | POA: Diagnosis not present

## 2017-12-22 DIAGNOSIS — N184 Chronic kidney disease, stage 4 (severe): Secondary | ICD-10-CM | POA: Diagnosis not present

## 2017-12-26 ENCOUNTER — Other Ambulatory Visit (HOSPITAL_COMMUNITY): Payer: Self-pay | Admitting: Internal Medicine

## 2017-12-29 DIAGNOSIS — I130 Hypertensive heart and chronic kidney disease with heart failure and stage 1 through stage 4 chronic kidney disease, or unspecified chronic kidney disease: Secondary | ICD-10-CM | POA: Diagnosis not present

## 2017-12-29 DIAGNOSIS — D631 Anemia in chronic kidney disease: Secondary | ICD-10-CM | POA: Diagnosis not present

## 2017-12-29 DIAGNOSIS — N184 Chronic kidney disease, stage 4 (severe): Secondary | ICD-10-CM | POA: Diagnosis not present

## 2017-12-29 DIAGNOSIS — J9690 Respiratory failure, unspecified, unspecified whether with hypoxia or hypercapnia: Secondary | ICD-10-CM | POA: Diagnosis not present

## 2017-12-29 DIAGNOSIS — I481 Persistent atrial fibrillation: Secondary | ICD-10-CM | POA: Diagnosis not present

## 2017-12-29 DIAGNOSIS — I5042 Chronic combined systolic (congestive) and diastolic (congestive) heart failure: Secondary | ICD-10-CM | POA: Diagnosis not present

## 2017-12-30 DIAGNOSIS — J9690 Respiratory failure, unspecified, unspecified whether with hypoxia or hypercapnia: Secondary | ICD-10-CM | POA: Diagnosis not present

## 2017-12-30 DIAGNOSIS — I130 Hypertensive heart and chronic kidney disease with heart failure and stage 1 through stage 4 chronic kidney disease, or unspecified chronic kidney disease: Secondary | ICD-10-CM | POA: Diagnosis not present

## 2017-12-30 DIAGNOSIS — D631 Anemia in chronic kidney disease: Secondary | ICD-10-CM | POA: Diagnosis not present

## 2017-12-30 DIAGNOSIS — I481 Persistent atrial fibrillation: Secondary | ICD-10-CM | POA: Diagnosis not present

## 2017-12-30 DIAGNOSIS — N184 Chronic kidney disease, stage 4 (severe): Secondary | ICD-10-CM | POA: Diagnosis not present

## 2017-12-30 DIAGNOSIS — I5042 Chronic combined systolic (congestive) and diastolic (congestive) heart failure: Secondary | ICD-10-CM | POA: Diagnosis not present

## 2018-01-01 ENCOUNTER — Other Ambulatory Visit: Payer: Self-pay

## 2018-01-01 ENCOUNTER — Encounter (HOSPITAL_COMMUNITY): Payer: Self-pay | Admitting: Internal Medicine

## 2018-01-01 ENCOUNTER — Ambulatory Visit (HOSPITAL_COMMUNITY)
Admission: RE | Admit: 2018-01-01 | Discharge: 2018-01-01 | Disposition: A | Discharge status: Home / Self Care | Payer: Medicare Other | Source: Ambulatory Visit | Attending: Internal Medicine | Admitting: Internal Medicine

## 2018-01-01 VITALS — BP 110/78 | HR 61 | Wt 97.2 lb

## 2018-01-01 DIAGNOSIS — I429 Cardiomyopathy, unspecified: Secondary | ICD-10-CM | POA: Diagnosis not present

## 2018-01-01 DIAGNOSIS — I480 Paroxysmal atrial fibrillation: Secondary | ICD-10-CM | POA: Diagnosis not present

## 2018-01-01 DIAGNOSIS — Z79899 Other long term (current) drug therapy: Secondary | ICD-10-CM | POA: Insufficient documentation

## 2018-01-01 DIAGNOSIS — Z8249 Family history of ischemic heart disease and other diseases of the circulatory system: Secondary | ICD-10-CM | POA: Insufficient documentation

## 2018-01-01 DIAGNOSIS — Z7901 Long term (current) use of anticoagulants: Secondary | ICD-10-CM | POA: Insufficient documentation

## 2018-01-01 DIAGNOSIS — I739 Peripheral vascular disease, unspecified: Secondary | ICD-10-CM | POA: Diagnosis not present

## 2018-01-01 DIAGNOSIS — I2510 Atherosclerotic heart disease of native coronary artery without angina pectoris: Secondary | ICD-10-CM | POA: Diagnosis not present

## 2018-01-01 DIAGNOSIS — Z79891 Long term (current) use of opiate analgesic: Secondary | ICD-10-CM | POA: Insufficient documentation

## 2018-01-01 DIAGNOSIS — F0390 Unspecified dementia without behavioral disturbance: Secondary | ICD-10-CM | POA: Insufficient documentation

## 2018-01-01 DIAGNOSIS — I5022 Chronic systolic (congestive) heart failure: Secondary | ICD-10-CM | POA: Insufficient documentation

## 2018-01-01 DIAGNOSIS — I110 Hypertensive heart disease with heart failure: Secondary | ICD-10-CM | POA: Insufficient documentation

## 2018-01-01 DIAGNOSIS — I714 Abdominal aortic aneurysm, without rupture: Secondary | ICD-10-CM | POA: Diagnosis not present

## 2018-01-01 DIAGNOSIS — E785 Hyperlipidemia, unspecified: Secondary | ICD-10-CM | POA: Insufficient documentation

## 2018-01-01 DIAGNOSIS — I350 Nonrheumatic aortic (valve) stenosis: Secondary | ICD-10-CM | POA: Diagnosis not present

## 2018-01-01 NOTE — Patient Instructions (Signed)
We will contact you in 6 months to schedule your next appointment.  

## 2018-01-01 NOTE — Progress Notes (Signed)
Patient ID: Jacqueline Reilly, female   DOB: 07/29/1924, 84 y.o.   MRN: [id number]   ADVANCED HF CLINIC NOTE  Patient ID: Jacqueline Reilly, female   DOB: [birth year], 84 y.o.   MRN: [id number] PCP: [name] CHF: Jaslene Marsteller  HPI: [name] is an 84  y/o woman with CAD, AAA, systolic HF, mild dementia, afib and CAD. She has had two previous coronary interventions including a cutting balloon to her D2 in 2007 and a DES to her mid LCX in 2010. Her LV dysfunction has been out of proportion to her CAD.  Last echo in 2/18 showed EF 20-25% with severe LV dilation.  Admitted 7/11-7/24/15 with syncope and dyspnea. She had new onset A-fib/RVR. Troponin was 1.03> 1.59> 2.18 . She was placed on amiodarone and heparin drip. She developed respiratory distress and required intubation. She converted to NSR but later was bradycardiac with NSVT. Amiodarone temporarily stopped but restarted after hyperkalemia resolved. Hyperkalemia was thought to be contributing to bradycardia. Diuresed with IV lasix. Co-ox 29% and started on milrinone which we tried to wean off but she did not tolerate. Discharge weight 87 lbs.   Admitted in November 2016 for Gram-negative bacteremia ([place]) - enterobacter asburiae from PICC. Treated with ceftriaxone and then switched to oral levofloxacin for a total of 21 weeks and PICC replaced.   She has remained on milrinone for >3 years now.   Had hip fracture and underwent repair in 5/18.  Admitted in 8/18 with bacteremia. Blood cultures + for enterobacter and pan-sensitive ecoli species.Treated with vancomycin and Zosyn, patient was later changed to rocephin.   She returns for f/u with her [facility] aide. She remains on milrinone. Says she feels great and doesn't know why she has to keep coming to see me. Denies CP or SOB. No edema. [place] aide says she can ambulate without difficulty. No problem with milrinone infusion or PICC line. No fevers or chills. R.   Echo 8/16  EF 25% with diffuse  hypokinesis, mild AS/mild AI, RV mildly dilated with moderately decreased systolic function.  Echo 2/18 EF 25-30% Echo 8/18 EF 25-30%   ROS: All systems negative except as listed in HPI, PMH and Problem List.  SH:  Social History   Socioeconomic History  . Marital status: Married    Spouse name: Not on file  . Number of children: 1  . Years of education: Not on file  . Highest education level: Not on file  Social Needs  . Financial resource strain: Not on file  . Food insecurity - worry: Not on file  . Food insecurity - inability: Not on file  . Transportation needs - medical: Not on file  . Transportation needs - non-medical: Not on file  Occupational History  . Occupation: retired  Tobacco Use  . Smoking status: Never Smoker  . Smokeless tobacco: Never Used  Substance and Sexual Activity  . Alcohol use: Yes    Comment: occasionally  . Drug use: No  . Sexual activity: Not Currently  Other Topics Concern  . Not on file  Social History Narrative   She lives in [place], family helps with her care.    FH:  Family History  Problem Relation Age of Onset  . Heart disease Mother   . Heart disease Father   . Heart disease Brother        x 3    Past Medical History:  Diagnosis Date  . AAA (abdominal aortic aneurysm) ([place])   .  Atrial fibrillation ([place])   . Chronic systolic heart failure ([place])    a. ECHO (04/2014) EF 20-25%, diff HK, mild MR  . Coronary artery disease 2007   moderate ASCAD of the left system s/p PCI of the D2 and PCI of the left circ 03/2009  . Hyperlipidemia   . Hypertension   . PVD (peripheral vascular disease) ([place])   . Renal artery stenosis ([place])   . Ventricular dysfunction    left; ischemic    Current Outpatient Medications  Medication Sig Dispense Refill  . acetaminophen (TYLENOL) 500 MG tablet Take 1,000 mg by mouth 3 (three) times daily.    Marland Kitchen amiodarone (PACERONE) 200 MG tablet Take 100 mg by mouth daily.    Marland Kitchen apixaban (ELIQUIS) 2.5 MG  TABS tablet Take 1 tablet (2.5 mg total) by mouth 2 (two) times daily. 180 tablet 3  . busPIRone (BUSPAR) 5 MG tablet Take 5 mg by mouth daily.     . cefUROXime (CEFTIN) 500 MG tablet Take 1 tablet (500 mg total) by mouth daily. 8 tablet 0  . hydrOXYzine (ATARAX/VISTARIL) 10 MG tablet Take 0.5 tablets (5 mg total) by mouth 3 (three) times daily as needed for itching. 30 tablet 0  . milrinone (PRIMACOR) 20 MG/100 ML SOLN infusion Inject 0.125 mcg/kg/min into the vein continuous. Rate 1.6 ml/hr Weight= 43 kg    . Multiple Vitamin (MULTIVITAMIN) tablet Take 1 tablet by mouth daily. 30 tablet 1  . Omega-3 Fatty Acids (FISH OIL) 1000 MG CAPS Take 1 capsule by mouth 2 (two) times daily.     Marland Kitchen torsemide (DEMADEX) 20 MG tablet Take 2 tablets (40 mg total) by mouth 2 (two) times daily. 60 tablet 0  . traMADol (ULTRAM) 50 MG tablet Take 25 mg by mouth every 8 (eight) hours as needed for moderate pain.     No current facility-administered medications for this encounter.     Vitals:   01/01/18 1138  BP: 110/78  Pulse: 61  SpO2: 99%  Weight: 97 lb 4 oz (44.1 kg)    Wt Readings from Last 3 Encounters:  01/01/18 97 lb 4 oz (44.1 kg)  10/06/17 94 lb (42.6 kg)  10/02/17 94 lb (42.6 kg)   PHYSICAL EXAM: General:  Elderly frail No resp difficulty HEENT: normal Neck: supple. JVP 6 Carotids 2+ bilat; no bruits. No lymphadenopathy or thryomegaly appreciated. Cor: PMI nondisplaced. Regular rate & rhythm. N2/6 SEM RUSB Lungs: clear Abdomen: soft, nontender, nondistended. No hepatosplenomegaly. No bruits or masses. Good bowel sounds. Extremities: no cyanosis, clubbing, rash, edema RUE PICC site looks good  Neuro: alert & orientedx3, cranial nerves grossly intact. moves all 4 extremities w/o difficulty. Affect pleasant   ASSESSMENT & PLAN:   1) Chronic Systolic Heart Failure: Mixed ischemic/nonischemic cardiomyopathy (degree of LV dysfunction is out of proportion to CAD), EF 25% with moderate RV  systolic dysfunction .  Echo  8/18 EF 25-30% Mild AS- Stable NYHA II symptoms and volume status on home milrinone. Will continue milrinone for palliative care purposes (has failed wean in past)Continue torsemide. - Continue HH aide and [facility] support 2) Atrial fibrillation:  - Paroxysmal.  Remains in NSR today - Continue apixaban 2.5 mg bid (weight less than 60 kg and age >66). Continue amiodarone 100 mg daily.   3) Limited Code: No CPR or defibrillation 4) CAD: No  S/s of ischemia. Off ASA with Eliquis. Intolerant of statins in past.  5) Dementia; - Stable.  6) Aortic stenosis - Mild by echo  8/18. Will follow  Glori Bickers MD  01/01/2018

## 2018-01-05 DIAGNOSIS — D631 Anemia in chronic kidney disease: Secondary | ICD-10-CM | POA: Diagnosis not present

## 2018-01-05 DIAGNOSIS — I130 Hypertensive heart and chronic kidney disease with heart failure and stage 1 through stage 4 chronic kidney disease, or unspecified chronic kidney disease: Secondary | ICD-10-CM | POA: Diagnosis not present

## 2018-01-05 DIAGNOSIS — N184 Chronic kidney disease, stage 4 (severe): Secondary | ICD-10-CM | POA: Diagnosis not present

## 2018-01-05 DIAGNOSIS — I481 Persistent atrial fibrillation: Secondary | ICD-10-CM | POA: Diagnosis not present

## 2018-01-05 DIAGNOSIS — I5042 Chronic combined systolic (congestive) and diastolic (congestive) heart failure: Secondary | ICD-10-CM | POA: Diagnosis not present

## 2018-01-05 DIAGNOSIS — J9690 Respiratory failure, unspecified, unspecified whether with hypoxia or hypercapnia: Secondary | ICD-10-CM | POA: Diagnosis not present

## 2018-01-07 ENCOUNTER — Ambulatory Visit (INDEPENDENT_AMBULATORY_CARE_PROVIDER_SITE_OTHER): Payer: Medicare Other | Admitting: Podiatry

## 2018-01-07 ENCOUNTER — Encounter: Payer: Self-pay | Admitting: Podiatry

## 2018-01-07 DIAGNOSIS — M79676 Pain in unspecified toe(s): Secondary | ICD-10-CM

## 2018-01-07 DIAGNOSIS — B351 Tinea unguium: Secondary | ICD-10-CM | POA: Diagnosis not present

## 2018-01-07 DIAGNOSIS — L84 Corns and callosities: Secondary | ICD-10-CM | POA: Diagnosis not present

## 2018-01-07 NOTE — Progress Notes (Addendum)
This patient presents to the office with chief complaint of painful long thick nails.  She says the nail on her third toe, right foot is especially painful walking and wearing her shoes.  This 84 year old. Patient has not been seen for over 9 months. She presents the office today with her nurse.  She presents the office today for preventative foot care services. Patient is taking eliquiss.   General Appearance  Alert, conversant and in no acute stress.  Vascular  Dorsalis pedis and posterior tibial  pulses are weakly  palpable  bilaterally.  Capillary return is within normal limits  bilaterally. Cold feet noted   bilaterally.  Neurologic  Senn-Weinstein monofilament wire test within normal limits  bilaterally. Muscle power within normal limits bilaterally.  Nails Thick disfigured discolored nails with subungual debris  from hallux to fifth toes bilaterally. No evidence of bacterial infection or drainage bilaterally.  Orthopedic  No limitations of motion of motion feet .  No crepitus or effusions noted.  HAV 1st MPJ  B/L and tailors bunion  B/L.    Skin  normotropic skin with no porokeratosis noted bilaterally.  No signs of infections or ulcers noted.  Distal clavi 3rd toe right foot.   Onychomycosis  B/L.  Clavi 3rd toe right foot.  Debride nails  X 10.  Debride clavi 3rd toe right foot.  Padding dispensed.  RTC 3 months   Gardiner Barefoot DPM

## 2018-01-08 DIAGNOSIS — L298 Other pruritus: Secondary | ICD-10-CM | POA: Diagnosis not present

## 2018-01-09 ENCOUNTER — Other Ambulatory Visit (HOSPITAL_COMMUNITY): Payer: Self-pay | Admitting: Internal Medicine

## 2018-01-10 ENCOUNTER — Encounter (HOSPITAL_COMMUNITY): Payer: Self-pay

## 2018-01-10 ENCOUNTER — Emergency Department (HOSPITAL_COMMUNITY): Payer: Medicare Other

## 2018-01-10 ENCOUNTER — Other Ambulatory Visit: Payer: Self-pay

## 2018-01-10 ENCOUNTER — Emergency Department (HOSPITAL_COMMUNITY)
Admission: EM | Admit: 2018-01-10 | Discharge: 2018-01-10 | Disposition: A | Discharge status: Home / Self Care | Payer: Medicare Other | Attending: Emergency Medicine | Admitting: Emergency Medicine

## 2018-01-10 DIAGNOSIS — M25471 Effusion, right ankle: Secondary | ICD-10-CM

## 2018-01-10 DIAGNOSIS — I2510 Atherosclerotic heart disease of native coronary artery without angina pectoris: Secondary | ICD-10-CM | POA: Insufficient documentation

## 2018-01-10 DIAGNOSIS — R2241 Localized swelling, mass and lump, right lower limb: Secondary | ICD-10-CM | POA: Diagnosis not present

## 2018-01-10 DIAGNOSIS — Z79899 Other long term (current) drug therapy: Secondary | ICD-10-CM | POA: Insufficient documentation

## 2018-01-10 DIAGNOSIS — I252 Old myocardial infarction: Secondary | ICD-10-CM | POA: Diagnosis not present

## 2018-01-10 DIAGNOSIS — N184 Chronic kidney disease, stage 4 (severe): Secondary | ICD-10-CM | POA: Diagnosis not present

## 2018-01-10 DIAGNOSIS — I130 Hypertensive heart and chronic kidney disease with heart failure and stage 1 through stage 4 chronic kidney disease, or unspecified chronic kidney disease: Secondary | ICD-10-CM | POA: Diagnosis not present

## 2018-01-10 DIAGNOSIS — M25571 Pain in right ankle and joints of right foot: Secondary | ICD-10-CM | POA: Insufficient documentation

## 2018-01-10 DIAGNOSIS — I5042 Chronic combined systolic (congestive) and diastolic (congestive) heart failure: Secondary | ICD-10-CM | POA: Insufficient documentation

## 2018-01-10 DIAGNOSIS — Z7901 Long term (current) use of anticoagulants: Secondary | ICD-10-CM | POA: Diagnosis not present

## 2018-01-10 MED ORDER — CEPHALEXIN 500 MG PO CAPS
500.0000 mg | ORAL_CAPSULE | Freq: Once | ORAL | Status: AC
Start: 2018-01-10 — End: 2018-01-10
  Administered 2018-01-10: 500 mg via ORAL
  Filled 2018-01-10: qty 1

## 2018-01-10 MED ORDER — CEPHALEXIN 500 MG PO CAPS: 500.0000 mg | ORAL_CAPSULE | Freq: Three times a day (TID) | ORAL | 0 refills | Status: DC

## 2018-01-10 NOTE — ED Triage Notes (Signed)
She is with a home caregiver, who tells me pt. Has recently been c/o non-traumatic pain at post. Distal right calf area. Pt. Is in no distress.

## 2018-01-10 NOTE — ED Provider Notes (Signed)
[facility] DEPT Provider Note   CSN: [id number] Arrival date & time: 01/10/18  1354     History   Chief Complaint Chief Complaint  Patient presents with  . Leg Injury    HPI Jacqueline Reilly is a 84 y.o. female.  HPI Jacqueline Reilly is a 84 y.o. female presents to ED with complaint of right ankle pain and swelling to the right ankle.  Patient does have history of dementia.  She also has history of chronic heart failure and is on milrinone drip, with history of frequent bacteremia.  She is unsure if she injured her ankle, she has poor memory due to her dementia.  Caretaker is at bedside and states that they are not aware of any injuries.  She did walk a lot on her feet 2 days ago, more than usual.  Ankle swelling started yesterday.  Today it is worse.  She still able to walk on it and move her ankle joint.  No fever or chills.  No systemic symptoms.  No other complaints at this time.  Past Medical History:  Diagnosis Date  . AAA (abdominal aortic aneurysm) ([place])   . Atrial fibrillation ([place])   . Chronic systolic heart failure ([place])    a. ECHO (04/2014) EF 20-25%, diff HK, mild MR  . Coronary artery disease 2007   moderate ASCAD of the left system s/p PCI of the D2 and PCI of the left circ 03/2009  . Hyperlipidemia   . Hypertension   . PVD (peripheral vascular disease) ([place])   . Renal artery stenosis ([place])   . Ventricular dysfunction    left; ischemic    Patient Active Problem List   Diagnosis Date Noted  . Pulmonary hypertension ([place]) 08/02/2017  . SIRS (systemic inflammatory response syndrome) ([place]) 05/27/2017  . Acute blood loss anemia 05/27/2017  . Anxiety 05/27/2017  . CKD (chronic kidney disease), stage IV ([place]) 05/27/2017  . Heart failure with reduced ejection fraction ([place]) 04/05/2017  . Receiving inotropic medication 04/05/2017  . Hypokalemia 04/05/2017  . Malnutrition of moderate degree 03/05/2017  . Left displaced femoral neck  fracture ([place]) 03/04/2017  . Leukocytosis 03/04/2017  . Hypoxia   . Gram-negative bacteremia 09/01/2015  . Chronic anemia 09/01/2015  . Persistent atrial fibrillation ([place])   . Acute on chronic systolic CHF (congestive heart failure) ([place]) 06/21/2015  . Atrial fibrillation ([place]) 08/10/2014  . Physical deconditioning 05/26/2014  . Acute respiratory failure with hypoxia ([place]) 05/08/2014  . NSTEMI, initial episode of care ([place]) 05/07/2014  . Atrial fibrillation with rapid ventricular response ([place]) 05/07/2014  . NSTEMI (non-ST elevated myocardial infarction) ([place]) 05/07/2014  . Abnormal chest x-ray 04/24/2014  . CHF (congestive heart failure) ([place]) 04/23/2014  . Acute on chronic combined systolic and diastolic congestive heart failure ([place]) 04/23/2014  . AAA (abdominal aortic aneurysm) ([place]) 10/07/2013  . SOB (shortness of breath) 09/30/2013  . Coronary artery disease   . Ischemic dilated cardiomyopathy ([place])   . Hypertension     Past Surgical History:  Procedure Laterality Date  . ANGIOPLASTY    . ANTERIOR APPROACH HEMI HIP ARTHROPLASTY Left 03/05/2017   Procedure: ANTERIOR APPROACH LEFT HIP HEMI ARTHROPLASTY;  Surgeon: Leandrew Koyanagi, MD;  Location: [facility];  Service: Orthopedics;  Laterality: Left;  . CORONARY STENT PLACEMENT    . IR GENERIC HISTORICAL  09/25/2016   IR FLUORO GUIDE CV LINE RIGHT 09/25/2016 Ardis Rowan, PA-C [facility]  . retinal cryopexy  right eye (for retinal detachment)    OB History    No data available       Home Medications    Prior to Admission medications   Medication Sig Start Date End Date Taking? Authorizing Provider  acetaminophen (TYLENOL) 500 MG tablet Take 1,000 mg by mouth 3 (three) times daily.   Yes [provider]  amiodarone (PACERONE) 200 MG tablet Take 100 mg by mouth daily.   Yes [provider]  apixaban (ELIQUIS) 2.5 MG TABS tablet Take 1 tablet (2.5 mg total) by mouth 2 (two) times daily. 11/22/16  Yes  Bensimhon, Shaune Pascal, MD  busPIRone (BUSPAR) 5 MG tablet Take 5 mg by mouth daily.    Yes [provider]  milrinone (PRIMACOR) 20 MG/100 ML SOLN infusion Inject 0.125 mcg/kg/min into the vein continuous. Rate 1.6 ml/hr Weight= 43 kg   Yes [provider]  Multiple Vitamin (MULTIVITAMIN) tablet Take 1 tablet by mouth daily. 07/26/14  Yes Larey Dresser, MD  Omega-3 Fatty Acids (FISH OIL) 1000 MG CAPS Take 1 capsule by mouth 2 (two) times daily.    Yes [provider]  torsemide (DEMADEX) 20 MG tablet Take 2 tablets (40 mg total) by mouth 2 (two) times daily. 06/01/17  Yes Donne Hazel, MD  cefUROXime (CEFTIN) 500 MG tablet Take 1 tablet (500 mg total) by mouth daily. Patient not taking: Reported on 01/10/2018 06/01/17   Donne Hazel, MD    Family History Family History  Problem Relation Age of Onset  . Heart disease Mother   . Heart disease Father   . Heart disease Brother        x 3    Social History Social History   Tobacco Use  . Smoking status: Never Smoker  . Smokeless tobacco: Never Used  Substance Use Topics  . Alcohol use: Yes    Comment: occasionally  . Drug use: No     Allergies   Codeine and Garlic   Review of Systems Review of Systems  Constitutional: Negative for chills and fever.  Respiratory: Negative for cough, chest tightness and shortness of breath.   Cardiovascular: Negative for chest pain, palpitations and leg swelling.  Gastrointestinal: Negative for abdominal pain, diarrhea, nausea and vomiting.  Musculoskeletal: Positive for arthralgias and joint swelling. Negative for myalgias, neck pain and neck stiffness.  Skin: Negative for rash.  Neurological: Negative for dizziness, weakness and headaches.  All other systems reviewed and are negative.    Physical Exam Updated Vital Signs BP 130/72 (BP Location: Left Arm)   Pulse 69   Temp 98.8 F (37.1 C) (Oral)   Resp 16   Ht 4\' 11"  (1.499 m)   Wt 44 kg (97 lb)   SpO2  99%   BMI 19.59 kg/m   Physical Exam  Constitutional: She appears well-developed and well-nourished. No distress.  HENT:  Head: Normocephalic.  Eyes: Conjunctivae are normal.  Neck: Neck supple.  Cardiovascular: Normal rate, regular rhythm and normal heart sounds.  Pulmonary/Chest: Effort normal and breath sounds normal. No respiratory distress. She has no wheezes. She has no rales.  Musculoskeletal: She exhibits no edema.  Obvious swelling noted to the right ankle.  There is mild erythema around Achilles tendon and medial malleolus.  There is tenderness over soft tissue around the ankle joint.  Patient has full range of motion of the ankle passively and actively.  Joint is stable.  Achilles tendon is intact.  No swelling or pain or tenderness  proximal to the ankle joint.  Dorsal pedal pulses are intact.  Toes are pink, warm, capillary refill less than 2 seconds distally.  Neurological: She is alert.  Skin: Skin is warm and dry.  Psychiatric: She has a normal mood and affect. Her behavior is normal.  Nursing note and vitals reviewed.    ED Treatments / Results  Labs (all labs ordered are listed, but only abnormal results are displayed) Labs Reviewed - No data to display  EKG  EKG Interpretation None       Radiology Dg Ankle Complete Right  Result Date: 01/10/2018 CLINICAL DATA:  Right ankle pain and swelling.  No reported injury. EXAM: RIGHT ANKLE - COMPLETE 3+ VIEW COMPARISON:  None. FINDINGS: Prominent lateral right ankle soft tissue swelling. Diffuse osteopenia. No fracture or subluxation. No definite cortical erosions or periosteal reaction. Mild osteoarthritis in the right ankle joint. No suspicious focal osseous lesions. Small Achilles and plantar right calcaneal spurs. No radiopaque foreign body. IMPRESSION: Prominent lateral right ankle soft tissue swelling. No definite radiographic findings of osteomyelitis. No fracture or subluxation. Mild right ankle joint  osteoarthritis. Electronically Signed   By: Ilona Sorrel M.D.   On: 01/10/2018 21:36    Procedures Procedures (including critical care time)  Medications Ordered in ED Medications  cephALEXin (KEFLEX) capsule 500 mg (not administered)     Initial Impression / Assessment and Plan / ED Course  I have reviewed the triage vital signs and the nursing notes.  Pertinent labs & imaging results that were available during my care of the patient were reviewed by me and considered in my medical decision making (see chart for details).     Patient with sudden onset of right ankle swelling.  No known injuries, however patient is not sure.  She did walk extra 2 days ago which she normally does not do.  She does have a PICC line for milrinone, with frequent infections.  Today she is afebrile, she is nontoxic appearing, she is not complaining of anything other than ankle pain.  She has full range of motion of the ankle actively and passively, I do not think she has a septic joint.  X-ray was obtained and showed soft tissue swelling with no bony abnormality other than osteoarthritis.  Discussed with Dr. Darl Householder who has seen patient.  Bedside ultrasound showed no evidence of a large joint effusion.  Again do not think septic joint in the situation.  Question arthritis, versus a sprain of the ankle, versus tendinitis, versus cellulitis.  Will place an ASO brace, patient would like to wear postop shoe that she has been wearing for her calluses, and will prescribe Keflex for possible infection.  We will have her recheck in 2 days.  Vitals:   01/10/18 1440 01/10/18 1441 01/10/18 2132  BP: (!) 116/91  130/72  Pulse: 70  69  Resp: 18  16  Temp: 98.8 F (37.1 C)  98.8 F (37.1 C)  TempSrc: Oral  Oral  SpO2: 97%  99%  Weight:  44 kg (97 lb)   Height:  4\' 11"  (1.499 m)      Final Clinical Impressions(s) / ED Diagnoses   Final diagnoses:  Right ankle swelling    ED Discharge Orders    None         Jeannett Senior, PA-C 01/11/18 0010    Drenda Freeze, MD 01/11/18 [id number]

## 2018-01-10 NOTE — Discharge Instructions (Signed)
Keep ankle elevated. Tylenol for pain. Stay off of it as much as possible. Take keflex for possible infection until all gone. Follow up with a family doctor in 2 days.

## 2018-01-12 DIAGNOSIS — I481 Persistent atrial fibrillation: Secondary | ICD-10-CM | POA: Diagnosis not present

## 2018-01-12 DIAGNOSIS — I130 Hypertensive heart and chronic kidney disease with heart failure and stage 1 through stage 4 chronic kidney disease, or unspecified chronic kidney disease: Secondary | ICD-10-CM | POA: Diagnosis not present

## 2018-01-12 DIAGNOSIS — N184 Chronic kidney disease, stage 4 (severe): Secondary | ICD-10-CM | POA: Diagnosis not present

## 2018-01-12 DIAGNOSIS — I5042 Chronic combined systolic (congestive) and diastolic (congestive) heart failure: Secondary | ICD-10-CM | POA: Diagnosis not present

## 2018-01-12 DIAGNOSIS — J9690 Respiratory failure, unspecified, unspecified whether with hypoxia or hypercapnia: Secondary | ICD-10-CM | POA: Diagnosis not present

## 2018-01-12 DIAGNOSIS — D631 Anemia in chronic kidney disease: Secondary | ICD-10-CM | POA: Diagnosis not present

## 2018-01-16 ENCOUNTER — Other Ambulatory Visit (HOSPITAL_COMMUNITY): Payer: Self-pay | Admitting: Internal Medicine

## 2018-01-16 DIAGNOSIS — D631 Anemia in chronic kidney disease: Secondary | ICD-10-CM | POA: Diagnosis not present

## 2018-01-16 DIAGNOSIS — I481 Persistent atrial fibrillation: Secondary | ICD-10-CM | POA: Diagnosis not present

## 2018-01-16 DIAGNOSIS — I5042 Chronic combined systolic (congestive) and diastolic (congestive) heart failure: Secondary | ICD-10-CM | POA: Diagnosis not present

## 2018-01-16 DIAGNOSIS — J9690 Respiratory failure, unspecified, unspecified whether with hypoxia or hypercapnia: Secondary | ICD-10-CM | POA: Diagnosis not present

## 2018-01-16 DIAGNOSIS — N184 Chronic kidney disease, stage 4 (severe): Secondary | ICD-10-CM | POA: Diagnosis not present

## 2018-01-16 DIAGNOSIS — I130 Hypertensive heart and chronic kidney disease with heart failure and stage 1 through stage 4 chronic kidney disease, or unspecified chronic kidney disease: Secondary | ICD-10-CM | POA: Diagnosis not present

## 2018-01-19 DIAGNOSIS — N184 Chronic kidney disease, stage 4 (severe): Secondary | ICD-10-CM | POA: Diagnosis not present

## 2018-01-19 DIAGNOSIS — D631 Anemia in chronic kidney disease: Secondary | ICD-10-CM | POA: Diagnosis not present

## 2018-01-19 DIAGNOSIS — I5042 Chronic combined systolic (congestive) and diastolic (congestive) heart failure: Secondary | ICD-10-CM | POA: Diagnosis not present

## 2018-01-19 DIAGNOSIS — J9690 Respiratory failure, unspecified, unspecified whether with hypoxia or hypercapnia: Secondary | ICD-10-CM | POA: Diagnosis not present

## 2018-01-19 DIAGNOSIS — I130 Hypertensive heart and chronic kidney disease with heart failure and stage 1 through stage 4 chronic kidney disease, or unspecified chronic kidney disease: Secondary | ICD-10-CM | POA: Diagnosis not present

## 2018-01-19 DIAGNOSIS — I481 Persistent atrial fibrillation: Secondary | ICD-10-CM | POA: Diagnosis not present

## 2018-01-20 DIAGNOSIS — M25471 Effusion, right ankle: Secondary | ICD-10-CM | POA: Diagnosis not present

## 2018-01-20 DIAGNOSIS — F419 Anxiety disorder, unspecified: Secondary | ICD-10-CM | POA: Diagnosis not present

## 2018-01-21 ENCOUNTER — Telehealth (HOSPITAL_COMMUNITY): Payer: Self-pay | Admitting: *Deleted

## 2018-01-21 ENCOUNTER — Emergency Department (HOSPITAL_COMMUNITY)
Admission: EM | Admit: 2018-01-21 | Discharge: 2018-01-22 | Disposition: A | Discharge status: Home / Self Care | Payer: Medicare Other | Attending: Emergency Medicine | Admitting: Emergency Medicine

## 2018-01-21 ENCOUNTER — Encounter (HOSPITAL_COMMUNITY): Payer: Self-pay | Admitting: Emergency Medicine

## 2018-01-21 ENCOUNTER — Other Ambulatory Visit: Payer: Self-pay

## 2018-01-21 DIAGNOSIS — Z79899 Other long term (current) drug therapy: Secondary | ICD-10-CM | POA: Diagnosis not present

## 2018-01-21 DIAGNOSIS — I130 Hypertensive heart and chronic kidney disease with heart failure and stage 1 through stage 4 chronic kidney disease, or unspecified chronic kidney disease: Secondary | ICD-10-CM | POA: Diagnosis not present

## 2018-01-21 DIAGNOSIS — I5022 Chronic systolic (congestive) heart failure: Secondary | ICD-10-CM | POA: Insufficient documentation

## 2018-01-21 DIAGNOSIS — I110 Hypertensive heart disease with heart failure: Secondary | ICD-10-CM | POA: Insufficient documentation

## 2018-01-21 DIAGNOSIS — T82898A Other specified complication of vascular prosthetic devices, implants and grafts, initial encounter: Secondary | ICD-10-CM | POA: Insufficient documentation

## 2018-01-21 DIAGNOSIS — Z7901 Long term (current) use of anticoagulants: Secondary | ICD-10-CM | POA: Insufficient documentation

## 2018-01-21 DIAGNOSIS — D631 Anemia in chronic kidney disease: Secondary | ICD-10-CM | POA: Diagnosis not present

## 2018-01-21 DIAGNOSIS — I2510 Atherosclerotic heart disease of native coronary artery without angina pectoris: Secondary | ICD-10-CM | POA: Insufficient documentation

## 2018-01-21 DIAGNOSIS — I481 Persistent atrial fibrillation: Secondary | ICD-10-CM | POA: Diagnosis not present

## 2018-01-21 DIAGNOSIS — Y828 Other medical devices associated with adverse incidents: Secondary | ICD-10-CM | POA: Diagnosis not present

## 2018-01-21 DIAGNOSIS — I5042 Chronic combined systolic (congestive) and diastolic (congestive) heart failure: Secondary | ICD-10-CM | POA: Diagnosis not present

## 2018-01-21 DIAGNOSIS — J9690 Respiratory failure, unspecified, unspecified whether with hypoxia or hypercapnia: Secondary | ICD-10-CM | POA: Diagnosis not present

## 2018-01-21 DIAGNOSIS — N184 Chronic kidney disease, stage 4 (severe): Secondary | ICD-10-CM | POA: Diagnosis not present

## 2018-01-21 MED ORDER — ALTEPLASE 2 MG IJ SOLR
2.0000 mg | Freq: Once | INTRAMUSCULAR | Status: AC
  Administered 2018-01-21: 2 mg
  Filled 2018-01-21: qty 2

## 2018-01-21 MED ORDER — SODIUM CHLORIDE 0.9% FLUSH: 10.0000 mL | INTRAVENOUS | Status: DC | PRN

## 2018-01-21 NOTE — ED Provider Notes (Signed)
TIME SEEN: 11:34 PM  CHIEF COMPLAINT: PICC line occlusion  HPI: Patient is a 84 year old female with history of atrial fibrillation, hypertension, hyperlipidemia, CHF on chronic milrinone infusion who presents to the emergency department with a right PICC line occlusion.  Most of the history is provided by home health staff.  They report that her home health nurse came out to evaluate the PICC line on Monday the 25th.  After the home health nurse left the milrinone drip began to beep.  The home health staff reports that they tried to change the batteries without any resolution.  Home health nurse came back out again today and found that the PICC line was occluded and they sent her to the emergency department.  It seems as though she has not received milrinone for 3 days.  She denies chest pain or shortness of breath.  She has no other complaints.  Patient resting comfortably.  ROS: See HPI Constitutional: no fever  Eyes: no drainage  ENT: no runny nose   Cardiovascular:  no chest pain  Resp: no SOB  GI: no vomiting GU: no dysuria Integumentary: no rash  Allergy: no hives  Musculoskeletal: no leg swelling  Neurological: no slurred speech ROS otherwise negative  PAST MEDICAL HISTORY/PAST SURGICAL HISTORY:  Past Medical History:  Diagnosis Date  . AAA (abdominal aortic aneurysm) ([place])   . Atrial fibrillation ([place])   . Chronic systolic heart failure ([place])    a. ECHO (04/2014) EF 20-25%, diff HK, mild MR  . Coronary artery disease 2007   moderate ASCAD of the left system s/p PCI of the D2 and PCI of the left circ 03/2009  . Hyperlipidemia   . Hypertension   . PVD (peripheral vascular disease) ([place])   . Renal artery stenosis ([place])   . Ventricular dysfunction    left; ischemic    MEDICATIONS:  Prior to Admission medications   Medication Sig Start Date End Date Taking? Authorizing Provider  acetaminophen (TYLENOL) 500 MG tablet Take 1,000 mg by mouth 3 (three) times daily.    [provider]  amiodarone (PACERONE) 200 MG tablet Take 100 mg by mouth daily.    [provider]  apixaban (ELIQUIS) 2.5 MG TABS tablet Take 1 tablet (2.5 mg total) by mouth 2 (two) times daily. 11/22/16   Bensimhon, Shaune Pascal, MD  busPIRone (BUSPAR) 5 MG tablet Take 5 mg by mouth daily.     [provider]  cefUROXime (CEFTIN) 500 MG tablet Take 1 tablet (500 mg total) by mouth daily. Patient not taking: Reported on 01/10/2018 06/01/17   Donne Hazel, MD  cephALEXin (KEFLEX) 500 MG capsule Take 1 capsule (500 mg total) by mouth 3 (three) times daily. 01/10/18   Kirichenko, Tatyana, PA-C  milrinone (PRIMACOR) 20 MG/100 ML SOLN infusion Inject 0.125 mcg/kg/min into the vein continuous. Rate 1.6 ml/hr Weight= 43 kg    [provider]  Multiple Vitamin (MULTIVITAMIN) tablet Take 1 tablet by mouth daily. 07/26/14   Larey Dresser, MD  Omega-3 Fatty Acids (FISH OIL) 1000 MG CAPS Take 1 capsule by mouth 2 (two) times daily.     [provider]  torsemide (DEMADEX) 20 MG tablet Take 2 tablets (40 mg total) by mouth 2 (two) times daily. 06/01/17   Donne Hazel, MD    ALLERGIES:  Allergies  Allergen Reactions  . Codeine Nausea Only  . Garlic Nausea Only    SOCIAL HISTORY:  Social History   Tobacco Use  .  Smoking status: Never Smoker  . Smokeless tobacco: Never Used  Substance Use Topics  . Alcohol use: Yes    Comment: occasionally    FAMILY HISTORY: Family History  Problem Relation Age of Onset  . Heart disease Mother   . Heart disease Father   . Heart disease Brother        x 3    EXAM: BP 125/78 (BP Location: Left Arm)   Pulse 64   Temp 98.2 F (36.8 C) (Oral)   Resp 16   SpO2 99%  CONSTITUTIONAL: Alert and oriented and responds appropriately to questions. Well-appearing; well-nourished, elderly, in no distress HEAD: Normocephalic EYES: Conjunctivae clear, pupils appear equal, EOMI ENT: normal nose; moist mucous membranes NECK: Supple,  no meningismus, no nuchal rigidity, no LAD  CARD: RRR; S1 and S2 appreciated; no murmurs, no clicks, no rubs, no gallops RESP: Normal chest excursion without splinting or tachypnea; breath sounds clear and equal bilaterally; no wheezes, no rhonchi, no rales, no hypoxia or respiratory distress, speaking full sentences ABD/GI: Normal bowel sounds; non-distended; soft, non-tender, no rebound, no guarding, no peritoneal signs, no hepatosplenomegaly BACK:  The back appears normal and is non-tender to palpation, there is no CVA tenderness EXT: Normal ROM in all joints; non-tender to palpation; no edema; normal capillary refill; no cyanosis, no calf tenderness or swelling, PICC line in the right upper extremity without redness or warmth or swelling or ecchymosis or tenderness surrounding it, 2+ radial pulse, compartments in the right arm are soft, no joint effusion    SKIN: Normal color for age and race; warm; no rash NEURO: Moves all extremities equally, able to ambulate with walker without any difficulty  PSYCH: The patient's mood and manner are appropriate. Grooming and personal hygiene are appropriate.  MEDICAL DECISION MAKING: Pt here with occluded PICC line.  IV team has attempted to flush this line several times using alteplase without success.  We will remove this PICC line as it appears it has been in place for several months.  Unfortunately home health staff and patient unable to tell us when it was last replaced but her last confirmatory chest x-ray was in July 2018.  No signs of surrounding infection.  We will place a peripheral IV in the meantime and start her milrinone drip.  She does not look like she is in a CHF exacerbation at this time.  She has been able to ambulate using her walker with no distress and has no complaints.  Plan is to replace her PICC line in the morning when team is available at 7:30 AM.  Patient and his home health staff comfortable with this plan.  ED PROGRESS: Patient  still resting comfortably.  No complaints at this time.  Awaiting PICC line placement and then will be discharged.  Home health staff reports that she has milrinone for home.  I reviewed all nursing notes, vitals, pertinent previous records, EKGs, lab and urine results, imaging (as available).      Pamela Intrieri, Delice Bison, DO 01/22/18 [phone]

## 2018-01-21 NOTE — Telephone Encounter (Signed)
[facility] pharmacy called to report pts PICC line had clotted. Also her pump was alarming and there is a possibility she has been without milrinone since Monday. Per Nira Conn I advised her to send patient to [facility] emergency room. She is agreeable with plan.

## 2018-01-21 NOTE — ED Triage Notes (Addendum)
Caregiver with pt reports PICC line in R upper arm was clogged this morning per home health RN.  She is unsure why pt has PICC line and what medication she was supposed to be receiving.  Per [facility], pt's pump has been alarming and pt has possibly been without Milrinone since Monday.

## 2018-01-21 NOTE — ED Notes (Signed)
IV team at bedside. Instructed this RN to let medication dwell in picc for around 1 hour and they will return to recheck.

## 2018-01-22 ENCOUNTER — Other Ambulatory Visit (HOSPITAL_COMMUNITY): Payer: Self-pay | Admitting: Internal Medicine

## 2018-01-22 ENCOUNTER — Emergency Department: Payer: Self-pay

## 2018-01-22 DIAGNOSIS — I5042 Chronic combined systolic (congestive) and diastolic (congestive) heart failure: Secondary | ICD-10-CM | POA: Diagnosis not present

## 2018-01-22 DIAGNOSIS — D631 Anemia in chronic kidney disease: Secondary | ICD-10-CM | POA: Diagnosis not present

## 2018-01-22 DIAGNOSIS — I481 Persistent atrial fibrillation: Secondary | ICD-10-CM | POA: Diagnosis not present

## 2018-01-22 DIAGNOSIS — I130 Hypertensive heart and chronic kidney disease with heart failure and stage 1 through stage 4 chronic kidney disease, or unspecified chronic kidney disease: Secondary | ICD-10-CM | POA: Diagnosis not present

## 2018-01-22 DIAGNOSIS — N184 Chronic kidney disease, stage 4 (severe): Secondary | ICD-10-CM | POA: Diagnosis not present

## 2018-01-22 DIAGNOSIS — J9690 Respiratory failure, unspecified, unspecified whether with hypoxia or hypercapnia: Secondary | ICD-10-CM | POA: Diagnosis not present

## 2018-01-22 DIAGNOSIS — T82898A Other specified complication of vascular prosthetic devices, implants and grafts, initial encounter: Secondary | ICD-10-CM | POA: Diagnosis not present

## 2018-01-22 MED ORDER — MILRINONE LACTATE IN DEXTROSE 20-5 MG/100ML-% IV SOLN
0.1250 ug/kg/min | INTRAVENOUS | Status: DC
  Administered 2018-01-22: 0.125 ug/kg/min via INTRAVENOUS
  Filled 2018-01-22: qty 100

## 2018-01-22 NOTE — Progress Notes (Signed)
Peripherally Inserted Central Catheter/Midline Placement  The IV Nurse has discussed with the patient and/or persons authorized to consent for the patient, the purpose of this procedure and the potential benefits and risks involved with this procedure.  The benefits include less needle sticks, lab draws from the catheter, and the patient may be discharged home with the catheter. Risks include, but not limited to, infection, bleeding, blood clot (thrombus formation), and puncture of an artery; nerve damage and irregular heartbeat and possibility to perform a PICC exchange if needed/ordered by physician.  Alternatives to this procedure were also discussed.  Bard Power PICC patient education guide, fact sheet on infection prevention and patient information card has been provided to patient /or left at bedside.    PICC/Midline Placement Documentation  PICC Single Lumen PICC Right (Active)     PICC Single Lumen 06/01/17 PICC Right Brachial 37 cm 1 cm (Active)     PICC Single Lumen 01/22/18 PICC Left Brachial 39 cm 0 cm (Active)  Indication for Insertion or Continuance of Line Home intravenous therapies (PICC only) 01/22/2018  9:00 AM  Exposed Catheter (cm) 0 cm 01/22/2018  9:00 AM  Site Assessment Clean;Dry;Intact 01/22/2018  9:00 AM  Line Status Flushed;Blood return noted 01/22/2018  9:00 AM  Dressing Type Transparent 01/22/2018  9:00 AM  Dressing Status Clean;Dry;Intact;Antimicrobial disc in place 01/22/2018  9:00 AM  Dressing Intervention New dressing 01/22/2018  9:00 AM  Dressing Change Due 01/29/18 01/22/2018  9:00 AM     PICC / Midline Double Lumen 89/38/10 PICC Left Basilic 43 cm 0 cm (Active)    Telephone consent by Vonzell Schlatter Albarece 01/22/2018, 9:25 AM

## 2018-01-22 NOTE — Discharge Instructions (Addendum)
Your PICC line was replaced today in the emergency department.  Please follow-up with your doctors as scheduled.  If any symptoms change or worsen, please return the nearest emergency department.

## 2018-01-22 NOTE — ED Provider Notes (Signed)
Care assumed from Dr. Leonides Schanz while awaiting PICC line replacement.  Next  According to nursing, patient had PICC line replaced without difficulty.  Patient's plan was to be discharged following this to continue her outpatient management and outpatient follow-up.  Patient was reassessed by me and had no complaints.  Patient will be discharged for outpatient management now that her PICC line has been replaced.  Clinical Impression: 1. Occluded PICC line, initial encounter [facility])     Disposition: Discharge  Condition: Good  I have discussed the results, Dx and Tx plan with the pt(& family if present). He/she/they expressed understanding and agree(s) with the plan. Discharge instructions discussed at great length. Strict return precautions discussed and pt &/or family have verbalized understanding of the instructions. No further questions at time of discharge.    New Prescriptions   No medications on file    Follow Up: [name], MD [address] [place] [address] [phone]   As needed     Tegeler, Gwenyth Allegra, MD 01/22/18 [phone]

## 2018-01-22 NOTE — ED Notes (Signed)
Iv team assessed PICC line on right upper arm, still no blood return. IV team stated will return in an hour after ateplase given. Pt has movable mass ob right upper arm, caregiver stated has always been there.

## 2018-01-22 NOTE — ED Notes (Signed)
IV team staff at bedside for PICC placement.

## 2018-01-26 ENCOUNTER — Other Ambulatory Visit (HOSPITAL_COMMUNITY): Payer: Self-pay | Admitting: Internal Medicine

## 2018-01-29 DIAGNOSIS — N184 Chronic kidney disease, stage 4 (severe): Secondary | ICD-10-CM | POA: Diagnosis not present

## 2018-01-29 DIAGNOSIS — D631 Anemia in chronic kidney disease: Secondary | ICD-10-CM | POA: Diagnosis not present

## 2018-01-29 DIAGNOSIS — I481 Persistent atrial fibrillation: Secondary | ICD-10-CM | POA: Diagnosis not present

## 2018-01-29 DIAGNOSIS — I130 Hypertensive heart and chronic kidney disease with heart failure and stage 1 through stage 4 chronic kidney disease, or unspecified chronic kidney disease: Secondary | ICD-10-CM | POA: Diagnosis not present

## 2018-01-29 DIAGNOSIS — J9690 Respiratory failure, unspecified, unspecified whether with hypoxia or hypercapnia: Secondary | ICD-10-CM | POA: Diagnosis not present

## 2018-01-29 DIAGNOSIS — I5042 Chronic combined systolic (congestive) and diastolic (congestive) heart failure: Secondary | ICD-10-CM | POA: Diagnosis not present

## 2018-02-05 ENCOUNTER — Telehealth (HOSPITAL_COMMUNITY): Payer: Self-pay | Admitting: *Deleted

## 2018-02-05 DIAGNOSIS — N184 Chronic kidney disease, stage 4 (severe): Secondary | ICD-10-CM | POA: Diagnosis not present

## 2018-02-05 DIAGNOSIS — I5042 Chronic combined systolic (congestive) and diastolic (congestive) heart failure: Secondary | ICD-10-CM | POA: Diagnosis not present

## 2018-02-05 DIAGNOSIS — I481 Persistent atrial fibrillation: Secondary | ICD-10-CM | POA: Diagnosis not present

## 2018-02-05 DIAGNOSIS — I130 Hypertensive heart and chronic kidney disease with heart failure and stage 1 through stage 4 chronic kidney disease, or unspecified chronic kidney disease: Secondary | ICD-10-CM | POA: Diagnosis not present

## 2018-02-05 DIAGNOSIS — D631 Anemia in chronic kidney disease: Secondary | ICD-10-CM | POA: Diagnosis not present

## 2018-02-05 DIAGNOSIS — J9690 Respiratory failure, unspecified, unspecified whether with hypoxia or hypercapnia: Secondary | ICD-10-CM | POA: Diagnosis not present

## 2018-02-05 NOTE — Telephone Encounter (Signed)
Nurse with [facility] called to report patients 3lb weight gain in 1 week. Patients current weight 103lbs. Called to get more information no answer/left VM.

## 2018-02-05 NOTE — Telephone Encounter (Signed)
Attempting to return call to [place]

## 2018-02-06 NOTE — Telephone Encounter (Signed)
Continue regimen. No change.   Latwan Luchsinger NP-C  12:15 PM

## 2018-02-06 NOTE — Telephone Encounter (Signed)
RN aware, she will continue to monitor patient.

## 2018-02-06 NOTE — Telephone Encounter (Signed)
Home health RN called back this morning reporting that patient had a 3 lb wt gain in the last week.  Patient isn't having any shortness of breath or chest pain.  Slight edema in her lower extremities.  Patient takes 40 mg of Torsemide BID.    Will forward to Darrick Grinder, NP to review and will call RN back with any further orders.

## 2018-02-07 DIAGNOSIS — M7632 Iliotibial band syndrome, left leg: Secondary | ICD-10-CM | POA: Diagnosis not present

## 2018-02-12 ENCOUNTER — Other Ambulatory Visit (HOSPITAL_COMMUNITY): Payer: Self-pay | Admitting: Internal Medicine

## 2018-02-12 ENCOUNTER — Ambulatory Visit (HOSPITAL_COMMUNITY)
Admission: RE | Admit: 2018-02-12 | Discharge: 2018-02-12 | Disposition: A | Discharge status: Home / Self Care | Payer: Medicare Other | Source: Ambulatory Visit | Attending: Internal Medicine | Admitting: Internal Medicine

## 2018-02-12 ENCOUNTER — Telehealth (HOSPITAL_COMMUNITY): Payer: Self-pay | Admitting: *Deleted

## 2018-02-12 ENCOUNTER — Telehealth (HOSPITAL_COMMUNITY): Payer: Self-pay

## 2018-02-12 DIAGNOSIS — I5042 Chronic combined systolic (congestive) and diastolic (congestive) heart failure: Secondary | ICD-10-CM | POA: Diagnosis not present

## 2018-02-12 DIAGNOSIS — Z452 Encounter for adjustment and management of vascular access device: Secondary | ICD-10-CM | POA: Insufficient documentation

## 2018-02-12 DIAGNOSIS — D631 Anemia in chronic kidney disease: Secondary | ICD-10-CM | POA: Diagnosis not present

## 2018-02-12 DIAGNOSIS — I5022 Chronic systolic (congestive) heart failure: Secondary | ICD-10-CM

## 2018-02-12 DIAGNOSIS — I481 Persistent atrial fibrillation: Secondary | ICD-10-CM | POA: Diagnosis not present

## 2018-02-12 DIAGNOSIS — N184 Chronic kidney disease, stage 4 (severe): Secondary | ICD-10-CM | POA: Diagnosis not present

## 2018-02-12 DIAGNOSIS — I130 Hypertensive heart and chronic kidney disease with heart failure and stage 1 through stage 4 chronic kidney disease, or unspecified chronic kidney disease: Secondary | ICD-10-CM | POA: Diagnosis not present

## 2018-02-12 DIAGNOSIS — T82594A Other mechanical complication of infusion catheter, initial encounter: Secondary | ICD-10-CM | POA: Diagnosis not present

## 2018-02-12 DIAGNOSIS — J9690 Respiratory failure, unspecified, unspecified whether with hypoxia or hypercapnia: Secondary | ICD-10-CM | POA: Diagnosis not present

## 2018-02-12 MED ORDER — CHLORHEXIDINE GLUCONATE 4 % EX LIQD
CUTANEOUS | Status: DC | PRN
  Administered 2018-02-12: 1 via TOPICAL

## 2018-02-12 MED ORDER — LIDOCAINE HCL 1 % IJ SOLN
INTRAMUSCULAR | Status: AC
  Filled 2018-02-12: qty 20

## 2018-02-12 MED ORDER — CHLORHEXIDINE GLUCONATE 4 % EX LIQD
CUTANEOUS | Status: AC
  Filled 2018-02-12: qty 15

## 2018-02-12 NOTE — Telephone Encounter (Signed)
Pt's HHRN called to report pt's PICC is not working, she states it is pulled out about 3 cm, will not draw back blood and is very sluggish and hard to flush, milrinone will not infuse.  Order placed to replace PICC per Dr Haroldine Laws.  Spoke w/IR they state they can work pt in this afternoon however they will have to be able to reach someone to get consent w/pt's dementia.   Advised HHRN of this situation, she gave me the number of pt's nephew Jacqueline Reilly [phone] work and [phone] cell, called his work they state he is not in the office today called and spoke Annalee Genta he states he will be available to give consent via phone today, he states if he does not answer his cell he can be reached at [phone].  Radiology is aware, they state pt can come over now to be worked in and they will contact the nephew once she is here.  Pt's caregiver Jacqueline Reilly is aware and will bring pt on over to radiology soon.

## 2018-02-12 NOTE — Procedures (Signed)
(  R)UE PICC dislodged several cm. Request for exchange  Successful image guided exchange of single lumen PICC line. Length 39 cm Tip at lower SVC/RA No complications Ready for use.   Ascencion Dike PA-C 02/12/2018 2:26 PM

## 2018-02-14 ENCOUNTER — Other Ambulatory Visit (HOSPITAL_COMMUNITY): Payer: Self-pay | Admitting: Internal Medicine

## 2018-02-16 DIAGNOSIS — N184 Chronic kidney disease, stage 4 (severe): Secondary | ICD-10-CM | POA: Diagnosis not present

## 2018-02-16 DIAGNOSIS — I420 Dilated cardiomyopathy: Secondary | ICD-10-CM | POA: Diagnosis not present

## 2018-02-16 DIAGNOSIS — F419 Anxiety disorder, unspecified: Secondary | ICD-10-CM | POA: Diagnosis not present

## 2018-02-16 DIAGNOSIS — Z7901 Long term (current) use of anticoagulants: Secondary | ICD-10-CM | POA: Diagnosis not present

## 2018-02-16 DIAGNOSIS — Z9181 History of falling: Secondary | ICD-10-CM | POA: Diagnosis not present

## 2018-02-16 DIAGNOSIS — E782 Mixed hyperlipidemia: Secondary | ICD-10-CM | POA: Diagnosis not present

## 2018-02-16 DIAGNOSIS — I481 Persistent atrial fibrillation: Secondary | ICD-10-CM | POA: Diagnosis not present

## 2018-02-16 DIAGNOSIS — D631 Anemia in chronic kidney disease: Secondary | ICD-10-CM | POA: Diagnosis not present

## 2018-02-16 DIAGNOSIS — J9690 Respiratory failure, unspecified, unspecified whether with hypoxia or hypercapnia: Secondary | ICD-10-CM | POA: Diagnosis not present

## 2018-02-16 DIAGNOSIS — Z452 Encounter for adjustment and management of vascular access device: Secondary | ICD-10-CM | POA: Diagnosis not present

## 2018-02-16 DIAGNOSIS — I739 Peripheral vascular disease, unspecified: Secondary | ICD-10-CM | POA: Diagnosis not present

## 2018-02-16 DIAGNOSIS — E43 Unspecified severe protein-calorie malnutrition: Secondary | ICD-10-CM | POA: Diagnosis not present

## 2018-02-16 DIAGNOSIS — F0390 Unspecified dementia without behavioral disturbance: Secondary | ICD-10-CM | POA: Diagnosis not present

## 2018-02-16 DIAGNOSIS — I2510 Atherosclerotic heart disease of native coronary artery without angina pectoris: Secondary | ICD-10-CM | POA: Diagnosis not present

## 2018-02-16 DIAGNOSIS — I5042 Chronic combined systolic (congestive) and diastolic (congestive) heart failure: Secondary | ICD-10-CM | POA: Diagnosis not present

## 2018-02-16 DIAGNOSIS — Z79891 Long term (current) use of opiate analgesic: Secondary | ICD-10-CM | POA: Diagnosis not present

## 2018-02-16 DIAGNOSIS — I252 Old myocardial infarction: Secondary | ICD-10-CM | POA: Diagnosis not present

## 2018-02-16 DIAGNOSIS — Z96642 Presence of left artificial hip joint: Secondary | ICD-10-CM | POA: Diagnosis not present

## 2018-02-16 DIAGNOSIS — I130 Hypertensive heart and chronic kidney disease with heart failure and stage 1 through stage 4 chronic kidney disease, or unspecified chronic kidney disease: Secondary | ICD-10-CM | POA: Diagnosis not present

## 2018-02-19 DIAGNOSIS — J9690 Respiratory failure, unspecified, unspecified whether with hypoxia or hypercapnia: Secondary | ICD-10-CM | POA: Diagnosis not present

## 2018-02-19 DIAGNOSIS — I130 Hypertensive heart and chronic kidney disease with heart failure and stage 1 through stage 4 chronic kidney disease, or unspecified chronic kidney disease: Secondary | ICD-10-CM | POA: Diagnosis not present

## 2018-02-19 DIAGNOSIS — D631 Anemia in chronic kidney disease: Secondary | ICD-10-CM | POA: Diagnosis not present

## 2018-02-19 DIAGNOSIS — E43 Unspecified severe protein-calorie malnutrition: Secondary | ICD-10-CM | POA: Diagnosis not present

## 2018-02-19 DIAGNOSIS — N184 Chronic kidney disease, stage 4 (severe): Secondary | ICD-10-CM | POA: Diagnosis not present

## 2018-02-19 DIAGNOSIS — I481 Persistent atrial fibrillation: Secondary | ICD-10-CM | POA: Diagnosis not present

## 2018-02-19 DIAGNOSIS — I5042 Chronic combined systolic (congestive) and diastolic (congestive) heart failure: Secondary | ICD-10-CM | POA: Diagnosis not present

## 2018-02-20 ENCOUNTER — Telehealth (HOSPITAL_COMMUNITY): Payer: Self-pay | Admitting: *Deleted

## 2018-02-20 NOTE — Telephone Encounter (Signed)
Pt's nephew Ihor Austin , who is health care power of attorney (this is in pt's chart from 2014) sent a fax stating he is going out of the country May 2nd-[place] and will be unavailabe by phone.  The sheet states that in the evet we need anything for his aunt we can contact Nancee Liter (caregiver) or is sister Aura Camps [phone]) and they can give authorization.  Note will be scanned into chart, demographics updated to include Mary's contact number.  Spoke w/Mr Gilberto Better and informed him we did receive this fax and it will be noted in her chart.

## 2018-02-26 DIAGNOSIS — J9690 Respiratory failure, unspecified, unspecified whether with hypoxia or hypercapnia: Secondary | ICD-10-CM | POA: Diagnosis not present

## 2018-02-26 DIAGNOSIS — I5042 Chronic combined systolic (congestive) and diastolic (congestive) heart failure: Secondary | ICD-10-CM | POA: Diagnosis not present

## 2018-02-26 DIAGNOSIS — I130 Hypertensive heart and chronic kidney disease with heart failure and stage 1 through stage 4 chronic kidney disease, or unspecified chronic kidney disease: Secondary | ICD-10-CM | POA: Diagnosis not present

## 2018-02-26 DIAGNOSIS — N184 Chronic kidney disease, stage 4 (severe): Secondary | ICD-10-CM | POA: Diagnosis not present

## 2018-02-26 DIAGNOSIS — D631 Anemia in chronic kidney disease: Secondary | ICD-10-CM | POA: Diagnosis not present

## 2018-02-26 DIAGNOSIS — I481 Persistent atrial fibrillation: Secondary | ICD-10-CM | POA: Diagnosis not present

## 2018-02-26 DIAGNOSIS — E43 Unspecified severe protein-calorie malnutrition: Secondary | ICD-10-CM | POA: Diagnosis not present

## 2018-02-27 ENCOUNTER — Other Ambulatory Visit (HOSPITAL_COMMUNITY): Payer: Self-pay | Admitting: Internal Medicine

## 2018-03-03 DIAGNOSIS — N184 Chronic kidney disease, stage 4 (severe): Secondary | ICD-10-CM | POA: Diagnosis not present

## 2018-03-03 DIAGNOSIS — I5042 Chronic combined systolic (congestive) and diastolic (congestive) heart failure: Secondary | ICD-10-CM | POA: Diagnosis not present

## 2018-03-03 DIAGNOSIS — I481 Persistent atrial fibrillation: Secondary | ICD-10-CM | POA: Diagnosis not present

## 2018-03-03 DIAGNOSIS — J9690 Respiratory failure, unspecified, unspecified whether with hypoxia or hypercapnia: Secondary | ICD-10-CM | POA: Diagnosis not present

## 2018-03-03 DIAGNOSIS — D631 Anemia in chronic kidney disease: Secondary | ICD-10-CM | POA: Diagnosis not present

## 2018-03-03 DIAGNOSIS — I130 Hypertensive heart and chronic kidney disease with heart failure and stage 1 through stage 4 chronic kidney disease, or unspecified chronic kidney disease: Secondary | ICD-10-CM | POA: Diagnosis not present

## 2018-03-05 DIAGNOSIS — I130 Hypertensive heart and chronic kidney disease with heart failure and stage 1 through stage 4 chronic kidney disease, or unspecified chronic kidney disease: Secondary | ICD-10-CM | POA: Diagnosis not present

## 2018-03-05 DIAGNOSIS — D631 Anemia in chronic kidney disease: Secondary | ICD-10-CM | POA: Diagnosis not present

## 2018-03-05 DIAGNOSIS — I5042 Chronic combined systolic (congestive) and diastolic (congestive) heart failure: Secondary | ICD-10-CM | POA: Diagnosis not present

## 2018-03-05 DIAGNOSIS — J9690 Respiratory failure, unspecified, unspecified whether with hypoxia or hypercapnia: Secondary | ICD-10-CM | POA: Diagnosis not present

## 2018-03-05 DIAGNOSIS — I481 Persistent atrial fibrillation: Secondary | ICD-10-CM | POA: Diagnosis not present

## 2018-03-05 DIAGNOSIS — E43 Unspecified severe protein-calorie malnutrition: Secondary | ICD-10-CM | POA: Diagnosis not present

## 2018-03-05 DIAGNOSIS — N184 Chronic kidney disease, stage 4 (severe): Secondary | ICD-10-CM | POA: Diagnosis not present

## 2018-03-06 ENCOUNTER — Other Ambulatory Visit (HOSPITAL_COMMUNITY): Payer: Self-pay | Admitting: Internal Medicine

## 2018-03-12 ENCOUNTER — Other Ambulatory Visit (HOSPITAL_COMMUNITY): Payer: Self-pay | Admitting: Internal Medicine

## 2018-03-12 DIAGNOSIS — I130 Hypertensive heart and chronic kidney disease with heart failure and stage 1 through stage 4 chronic kidney disease, or unspecified chronic kidney disease: Secondary | ICD-10-CM | POA: Diagnosis not present

## 2018-03-12 DIAGNOSIS — I5042 Chronic combined systolic (congestive) and diastolic (congestive) heart failure: Secondary | ICD-10-CM | POA: Diagnosis not present

## 2018-03-12 DIAGNOSIS — N184 Chronic kidney disease, stage 4 (severe): Secondary | ICD-10-CM | POA: Diagnosis not present

## 2018-03-12 DIAGNOSIS — E43 Unspecified severe protein-calorie malnutrition: Secondary | ICD-10-CM | POA: Diagnosis not present

## 2018-03-12 DIAGNOSIS — I481 Persistent atrial fibrillation: Secondary | ICD-10-CM | POA: Diagnosis not present

## 2018-03-12 DIAGNOSIS — J9690 Respiratory failure, unspecified, unspecified whether with hypoxia or hypercapnia: Secondary | ICD-10-CM | POA: Diagnosis not present

## 2018-03-12 DIAGNOSIS — D631 Anemia in chronic kidney disease: Secondary | ICD-10-CM | POA: Diagnosis not present

## 2018-03-19 DIAGNOSIS — I130 Hypertensive heart and chronic kidney disease with heart failure and stage 1 through stage 4 chronic kidney disease, or unspecified chronic kidney disease: Secondary | ICD-10-CM | POA: Diagnosis not present

## 2018-03-19 DIAGNOSIS — J9690 Respiratory failure, unspecified, unspecified whether with hypoxia or hypercapnia: Secondary | ICD-10-CM | POA: Diagnosis not present

## 2018-03-19 DIAGNOSIS — N184 Chronic kidney disease, stage 4 (severe): Secondary | ICD-10-CM | POA: Diagnosis not present

## 2018-03-19 DIAGNOSIS — D631 Anemia in chronic kidney disease: Secondary | ICD-10-CM | POA: Diagnosis not present

## 2018-03-19 DIAGNOSIS — I5042 Chronic combined systolic (congestive) and diastolic (congestive) heart failure: Secondary | ICD-10-CM | POA: Diagnosis not present

## 2018-03-19 DIAGNOSIS — I481 Persistent atrial fibrillation: Secondary | ICD-10-CM | POA: Diagnosis not present

## 2018-03-19 DIAGNOSIS — E43 Unspecified severe protein-calorie malnutrition: Secondary | ICD-10-CM | POA: Diagnosis not present

## 2018-03-26 DIAGNOSIS — J9690 Respiratory failure, unspecified, unspecified whether with hypoxia or hypercapnia: Secondary | ICD-10-CM | POA: Diagnosis not present

## 2018-03-26 DIAGNOSIS — E43 Unspecified severe protein-calorie malnutrition: Secondary | ICD-10-CM | POA: Diagnosis not present

## 2018-03-26 DIAGNOSIS — N184 Chronic kidney disease, stage 4 (severe): Secondary | ICD-10-CM | POA: Diagnosis not present

## 2018-03-26 DIAGNOSIS — D631 Anemia in chronic kidney disease: Secondary | ICD-10-CM | POA: Diagnosis not present

## 2018-03-26 DIAGNOSIS — I5042 Chronic combined systolic (congestive) and diastolic (congestive) heart failure: Secondary | ICD-10-CM | POA: Diagnosis not present

## 2018-03-26 DIAGNOSIS — I481 Persistent atrial fibrillation: Secondary | ICD-10-CM | POA: Diagnosis not present

## 2018-03-26 DIAGNOSIS — I130 Hypertensive heart and chronic kidney disease with heart failure and stage 1 through stage 4 chronic kidney disease, or unspecified chronic kidney disease: Secondary | ICD-10-CM | POA: Diagnosis not present

## 2018-04-02 DIAGNOSIS — I5042 Chronic combined systolic (congestive) and diastolic (congestive) heart failure: Secondary | ICD-10-CM | POA: Diagnosis not present

## 2018-04-02 DIAGNOSIS — J9690 Respiratory failure, unspecified, unspecified whether with hypoxia or hypercapnia: Secondary | ICD-10-CM | POA: Diagnosis not present

## 2018-04-02 DIAGNOSIS — D631 Anemia in chronic kidney disease: Secondary | ICD-10-CM | POA: Diagnosis not present

## 2018-04-02 DIAGNOSIS — I130 Hypertensive heart and chronic kidney disease with heart failure and stage 1 through stage 4 chronic kidney disease, or unspecified chronic kidney disease: Secondary | ICD-10-CM | POA: Diagnosis not present

## 2018-04-02 DIAGNOSIS — N184 Chronic kidney disease, stage 4 (severe): Secondary | ICD-10-CM | POA: Diagnosis not present

## 2018-04-02 DIAGNOSIS — I481 Persistent atrial fibrillation: Secondary | ICD-10-CM | POA: Diagnosis not present

## 2018-04-04 DIAGNOSIS — R402441 Other coma, without documented Glasgow coma scale score, or with partial score reported, in the field [EMT or ambulance]: Secondary | ICD-10-CM | POA: Diagnosis not present

## 2018-04-04 DIAGNOSIS — R000 Tachycardia, unspecified: Secondary | ICD-10-CM | POA: Diagnosis not present

## 2018-04-04 DIAGNOSIS — R0689 Other abnormalities of breathing: Secondary | ICD-10-CM | POA: Diagnosis not present

## 2018-04-04 DIAGNOSIS — R0902 Hypoxemia: Secondary | ICD-10-CM | POA: Diagnosis not present

## 2018-04-04 DIAGNOSIS — I959 Hypotension, unspecified: Secondary | ICD-10-CM | POA: Diagnosis not present

## 2018-04-08 ENCOUNTER — Other Ambulatory Visit (HOSPITAL_COMMUNITY): Payer: Self-pay | Admitting: Internal Medicine

## 2018-04-09 DIAGNOSIS — N184 Chronic kidney disease, stage 4 (severe): Secondary | ICD-10-CM | POA: Diagnosis not present

## 2018-04-09 DIAGNOSIS — I130 Hypertensive heart and chronic kidney disease with heart failure and stage 1 through stage 4 chronic kidney disease, or unspecified chronic kidney disease: Secondary | ICD-10-CM | POA: Diagnosis not present

## 2018-04-09 DIAGNOSIS — D631 Anemia in chronic kidney disease: Secondary | ICD-10-CM | POA: Diagnosis not present

## 2018-04-09 DIAGNOSIS — I5042 Chronic combined systolic (congestive) and diastolic (congestive) heart failure: Secondary | ICD-10-CM | POA: Diagnosis not present

## 2018-04-09 DIAGNOSIS — I481 Persistent atrial fibrillation: Secondary | ICD-10-CM | POA: Diagnosis not present

## 2018-04-09 DIAGNOSIS — J9690 Respiratory failure, unspecified, unspecified whether with hypoxia or hypercapnia: Secondary | ICD-10-CM | POA: Diagnosis not present

## 2018-04-13 DIAGNOSIS — I5020 Unspecified systolic (congestive) heart failure: Secondary | ICD-10-CM | POA: Diagnosis not present

## 2018-04-13 DIAGNOSIS — F0390 Unspecified dementia without behavioral disturbance: Secondary | ICD-10-CM | POA: Diagnosis not present

## 2018-04-16 DIAGNOSIS — I5042 Chronic combined systolic (congestive) and diastolic (congestive) heart failure: Secondary | ICD-10-CM | POA: Diagnosis not present

## 2018-04-16 DIAGNOSIS — I481 Persistent atrial fibrillation: Secondary | ICD-10-CM | POA: Diagnosis not present

## 2018-04-16 DIAGNOSIS — J9690 Respiratory failure, unspecified, unspecified whether with hypoxia or hypercapnia: Secondary | ICD-10-CM | POA: Diagnosis not present

## 2018-04-16 DIAGNOSIS — I130 Hypertensive heart and chronic kidney disease with heart failure and stage 1 through stage 4 chronic kidney disease, or unspecified chronic kidney disease: Secondary | ICD-10-CM | POA: Diagnosis not present

## 2018-04-16 DIAGNOSIS — N184 Chronic kidney disease, stage 4 (severe): Secondary | ICD-10-CM | POA: Diagnosis not present

## 2018-04-16 DIAGNOSIS — D631 Anemia in chronic kidney disease: Secondary | ICD-10-CM | POA: Diagnosis not present

## 2018-04-17 DIAGNOSIS — Z452 Encounter for adjustment and management of vascular access device: Secondary | ICD-10-CM | POA: Diagnosis not present

## 2018-04-17 DIAGNOSIS — I481 Persistent atrial fibrillation: Secondary | ICD-10-CM | POA: Diagnosis not present

## 2018-04-17 DIAGNOSIS — F0390 Unspecified dementia without behavioral disturbance: Secondary | ICD-10-CM | POA: Diagnosis not present

## 2018-04-17 DIAGNOSIS — I2510 Atherosclerotic heart disease of native coronary artery without angina pectoris: Secondary | ICD-10-CM | POA: Diagnosis not present

## 2018-04-17 DIAGNOSIS — Z7901 Long term (current) use of anticoagulants: Secondary | ICD-10-CM | POA: Diagnosis not present

## 2018-04-17 DIAGNOSIS — N184 Chronic kidney disease, stage 4 (severe): Secondary | ICD-10-CM | POA: Diagnosis not present

## 2018-04-17 DIAGNOSIS — J9690 Respiratory failure, unspecified, unspecified whether with hypoxia or hypercapnia: Secondary | ICD-10-CM | POA: Diagnosis not present

## 2018-04-17 DIAGNOSIS — I739 Peripheral vascular disease, unspecified: Secondary | ICD-10-CM | POA: Diagnosis not present

## 2018-04-17 DIAGNOSIS — D631 Anemia in chronic kidney disease: Secondary | ICD-10-CM | POA: Diagnosis not present

## 2018-04-17 DIAGNOSIS — I5042 Chronic combined systolic (congestive) and diastolic (congestive) heart failure: Secondary | ICD-10-CM | POA: Diagnosis not present

## 2018-04-17 DIAGNOSIS — I252 Old myocardial infarction: Secondary | ICD-10-CM | POA: Diagnosis not present

## 2018-04-17 DIAGNOSIS — E782 Mixed hyperlipidemia: Secondary | ICD-10-CM | POA: Diagnosis not present

## 2018-04-17 DIAGNOSIS — E43 Unspecified severe protein-calorie malnutrition: Secondary | ICD-10-CM | POA: Diagnosis not present

## 2018-04-17 DIAGNOSIS — Z96642 Presence of left artificial hip joint: Secondary | ICD-10-CM | POA: Diagnosis not present

## 2018-04-17 DIAGNOSIS — Z9181 History of falling: Secondary | ICD-10-CM | POA: Diagnosis not present

## 2018-04-17 DIAGNOSIS — F419 Anxiety disorder, unspecified: Secondary | ICD-10-CM | POA: Diagnosis not present

## 2018-04-17 DIAGNOSIS — I130 Hypertensive heart and chronic kidney disease with heart failure and stage 1 through stage 4 chronic kidney disease, or unspecified chronic kidney disease: Secondary | ICD-10-CM | POA: Diagnosis not present

## 2018-04-17 DIAGNOSIS — I420 Dilated cardiomyopathy: Secondary | ICD-10-CM | POA: Diagnosis not present

## 2018-04-17 DIAGNOSIS — Z79891 Long term (current) use of opiate analgesic: Secondary | ICD-10-CM | POA: Diagnosis not present

## 2018-04-22 ENCOUNTER — Other Ambulatory Visit (HOSPITAL_COMMUNITY): Payer: Self-pay | Admitting: Internal Medicine

## 2018-04-23 DIAGNOSIS — I481 Persistent atrial fibrillation: Secondary | ICD-10-CM | POA: Diagnosis not present

## 2018-04-23 DIAGNOSIS — I5042 Chronic combined systolic (congestive) and diastolic (congestive) heart failure: Secondary | ICD-10-CM | POA: Diagnosis not present

## 2018-04-23 DIAGNOSIS — J9690 Respiratory failure, unspecified, unspecified whether with hypoxia or hypercapnia: Secondary | ICD-10-CM | POA: Diagnosis not present

## 2018-04-23 DIAGNOSIS — N184 Chronic kidney disease, stage 4 (severe): Secondary | ICD-10-CM | POA: Diagnosis not present

## 2018-04-23 DIAGNOSIS — I509 Heart failure, unspecified: Secondary | ICD-10-CM | POA: Diagnosis not present

## 2018-04-23 DIAGNOSIS — I130 Hypertensive heart and chronic kidney disease with heart failure and stage 1 through stage 4 chronic kidney disease, or unspecified chronic kidney disease: Secondary | ICD-10-CM | POA: Diagnosis not present

## 2018-04-23 DIAGNOSIS — D631 Anemia in chronic kidney disease: Secondary | ICD-10-CM | POA: Diagnosis not present

## 2018-04-28 DIAGNOSIS — D631 Anemia in chronic kidney disease: Secondary | ICD-10-CM | POA: Diagnosis not present

## 2018-04-28 DIAGNOSIS — I5042 Chronic combined systolic (congestive) and diastolic (congestive) heart failure: Secondary | ICD-10-CM | POA: Diagnosis not present

## 2018-04-28 DIAGNOSIS — J9690 Respiratory failure, unspecified, unspecified whether with hypoxia or hypercapnia: Secondary | ICD-10-CM | POA: Diagnosis not present

## 2018-04-28 DIAGNOSIS — I130 Hypertensive heart and chronic kidney disease with heart failure and stage 1 through stage 4 chronic kidney disease, or unspecified chronic kidney disease: Secondary | ICD-10-CM | POA: Diagnosis not present

## 2018-04-28 DIAGNOSIS — I481 Persistent atrial fibrillation: Secondary | ICD-10-CM | POA: Diagnosis not present

## 2018-04-28 DIAGNOSIS — N184 Chronic kidney disease, stage 4 (severe): Secondary | ICD-10-CM | POA: Diagnosis not present

## 2018-05-01 DIAGNOSIS — I130 Hypertensive heart and chronic kidney disease with heart failure and stage 1 through stage 4 chronic kidney disease, or unspecified chronic kidney disease: Secondary | ICD-10-CM | POA: Diagnosis not present

## 2018-05-01 DIAGNOSIS — I481 Persistent atrial fibrillation: Secondary | ICD-10-CM | POA: Diagnosis not present

## 2018-05-01 DIAGNOSIS — J9690 Respiratory failure, unspecified, unspecified whether with hypoxia or hypercapnia: Secondary | ICD-10-CM | POA: Diagnosis not present

## 2018-05-01 DIAGNOSIS — D631 Anemia in chronic kidney disease: Secondary | ICD-10-CM | POA: Diagnosis not present

## 2018-05-01 DIAGNOSIS — N184 Chronic kidney disease, stage 4 (severe): Secondary | ICD-10-CM | POA: Diagnosis not present

## 2018-05-01 DIAGNOSIS — I5042 Chronic combined systolic (congestive) and diastolic (congestive) heart failure: Secondary | ICD-10-CM | POA: Diagnosis not present

## 2018-05-06 ENCOUNTER — Encounter: Payer: Self-pay | Admitting: Podiatry

## 2018-05-06 ENCOUNTER — Encounter

## 2018-05-06 ENCOUNTER — Ambulatory Visit (INDEPENDENT_AMBULATORY_CARE_PROVIDER_SITE_OTHER): Payer: Medicare Other | Admitting: Podiatry

## 2018-05-06 DIAGNOSIS — M79676 Pain in unspecified toe(s): Secondary | ICD-10-CM

## 2018-05-06 DIAGNOSIS — B351 Tinea unguium: Secondary | ICD-10-CM | POA: Diagnosis not present

## 2018-05-06 DIAGNOSIS — L84 Corns and callosities: Secondary | ICD-10-CM | POA: Diagnosis not present

## 2018-05-06 NOTE — Progress Notes (Addendum)
This patient presents to the office with chief complaint of painful long thick nails.  She says the nail on her third toe, right foot is especially painful walking and wearing her shoes.   She presents the office today with her nurse.  She presents the office today for preventative foot care services. Patient is taking eliquiss.   General Appearance  Alert, conversant and in no acute stress.  Vascular  Dorsalis pedis and posterior tibial  pulses are weakly  palpable  bilaterally.  Capillary return is within normal limits  bilaterally. Cold feet noted   bilaterally.  Neurologic  Senn-Weinstein monofilament wire test within normal limits  bilaterally. Muscle power within normal limits bilaterally.  Nails Thick disfigured discolored nails with subungual debris  from hallux to fifth toes bilaterally. No evidence of bacterial infection or drainage bilaterally.  Orthopedic  No limitations of motion of motion feet .  No crepitus or effusions noted.  HAV 1st MPJ  B/L and tailors bunion  B/L.    Skin  normotropic skin with no porokeratosis noted bilaterally.  No signs of infections or ulcers noted.  Distal clavi 3rd toe right foot.   Onychomycosis  B/L.  Clavi 3rd toe right foot.  Debride nails  X 10.  Debride clavi 3rd toe right foot.  Padding dispensed.  RTC 3 months  ABN signed for 2019.  Gardiner Barefoot DPM

## 2018-05-07 DIAGNOSIS — I130 Hypertensive heart and chronic kidney disease with heart failure and stage 1 through stage 4 chronic kidney disease, or unspecified chronic kidney disease: Secondary | ICD-10-CM | POA: Diagnosis not present

## 2018-05-07 DIAGNOSIS — D631 Anemia in chronic kidney disease: Secondary | ICD-10-CM | POA: Diagnosis not present

## 2018-05-07 DIAGNOSIS — N184 Chronic kidney disease, stage 4 (severe): Secondary | ICD-10-CM | POA: Diagnosis not present

## 2018-05-07 DIAGNOSIS — I481 Persistent atrial fibrillation: Secondary | ICD-10-CM | POA: Diagnosis not present

## 2018-05-07 DIAGNOSIS — J9690 Respiratory failure, unspecified, unspecified whether with hypoxia or hypercapnia: Secondary | ICD-10-CM | POA: Diagnosis not present

## 2018-05-07 DIAGNOSIS — I5042 Chronic combined systolic (congestive) and diastolic (congestive) heart failure: Secondary | ICD-10-CM | POA: Diagnosis not present

## 2018-05-07 NOTE — Telephone Encounter (Signed)
Signing past encounter of attempted phone call 

## 2018-05-14 DIAGNOSIS — D631 Anemia in chronic kidney disease: Secondary | ICD-10-CM | POA: Diagnosis not present

## 2018-05-14 DIAGNOSIS — I5042 Chronic combined systolic (congestive) and diastolic (congestive) heart failure: Secondary | ICD-10-CM | POA: Diagnosis not present

## 2018-05-14 DIAGNOSIS — N184 Chronic kidney disease, stage 4 (severe): Secondary | ICD-10-CM | POA: Diagnosis not present

## 2018-05-14 DIAGNOSIS — I481 Persistent atrial fibrillation: Secondary | ICD-10-CM | POA: Diagnosis not present

## 2018-05-14 DIAGNOSIS — J9690 Respiratory failure, unspecified, unspecified whether with hypoxia or hypercapnia: Secondary | ICD-10-CM | POA: Diagnosis not present

## 2018-05-14 DIAGNOSIS — I130 Hypertensive heart and chronic kidney disease with heart failure and stage 1 through stage 4 chronic kidney disease, or unspecified chronic kidney disease: Secondary | ICD-10-CM | POA: Diagnosis not present

## 2018-05-21 DIAGNOSIS — I5042 Chronic combined systolic (congestive) and diastolic (congestive) heart failure: Secondary | ICD-10-CM | POA: Diagnosis not present

## 2018-05-21 DIAGNOSIS — D631 Anemia in chronic kidney disease: Secondary | ICD-10-CM | POA: Diagnosis not present

## 2018-05-21 DIAGNOSIS — H900 Conductive hearing loss, bilateral: Secondary | ICD-10-CM | POA: Diagnosis not present

## 2018-05-21 DIAGNOSIS — I481 Persistent atrial fibrillation: Secondary | ICD-10-CM | POA: Diagnosis not present

## 2018-05-21 DIAGNOSIS — I130 Hypertensive heart and chronic kidney disease with heart failure and stage 1 through stage 4 chronic kidney disease, or unspecified chronic kidney disease: Secondary | ICD-10-CM | POA: Diagnosis not present

## 2018-05-21 DIAGNOSIS — N184 Chronic kidney disease, stage 4 (severe): Secondary | ICD-10-CM | POA: Diagnosis not present

## 2018-05-21 DIAGNOSIS — J9690 Respiratory failure, unspecified, unspecified whether with hypoxia or hypercapnia: Secondary | ICD-10-CM | POA: Diagnosis not present

## 2018-05-21 DIAGNOSIS — H6122 Impacted cerumen, left ear: Secondary | ICD-10-CM | POA: Diagnosis not present

## 2018-05-28 DIAGNOSIS — I130 Hypertensive heart and chronic kidney disease with heart failure and stage 1 through stage 4 chronic kidney disease, or unspecified chronic kidney disease: Secondary | ICD-10-CM | POA: Diagnosis not present

## 2018-05-28 DIAGNOSIS — J9690 Respiratory failure, unspecified, unspecified whether with hypoxia or hypercapnia: Secondary | ICD-10-CM | POA: Diagnosis not present

## 2018-05-28 DIAGNOSIS — N184 Chronic kidney disease, stage 4 (severe): Secondary | ICD-10-CM | POA: Diagnosis not present

## 2018-05-28 DIAGNOSIS — I481 Persistent atrial fibrillation: Secondary | ICD-10-CM | POA: Diagnosis not present

## 2018-05-28 DIAGNOSIS — D631 Anemia in chronic kidney disease: Secondary | ICD-10-CM | POA: Diagnosis not present

## 2018-05-28 DIAGNOSIS — I5042 Chronic combined systolic (congestive) and diastolic (congestive) heart failure: Secondary | ICD-10-CM | POA: Diagnosis not present

## 2018-05-29 DIAGNOSIS — I130 Hypertensive heart and chronic kidney disease with heart failure and stage 1 through stage 4 chronic kidney disease, or unspecified chronic kidney disease: Secondary | ICD-10-CM | POA: Diagnosis not present

## 2018-05-29 DIAGNOSIS — N184 Chronic kidney disease, stage 4 (severe): Secondary | ICD-10-CM | POA: Diagnosis not present

## 2018-05-29 DIAGNOSIS — D631 Anemia in chronic kidney disease: Secondary | ICD-10-CM | POA: Diagnosis not present

## 2018-05-29 DIAGNOSIS — I5042 Chronic combined systolic (congestive) and diastolic (congestive) heart failure: Secondary | ICD-10-CM | POA: Diagnosis not present

## 2018-05-29 DIAGNOSIS — I481 Persistent atrial fibrillation: Secondary | ICD-10-CM | POA: Diagnosis not present

## 2018-06-01 ENCOUNTER — Other Ambulatory Visit (HOSPITAL_COMMUNITY): Payer: Self-pay | Admitting: Cardiology

## 2018-06-01 ENCOUNTER — Other Ambulatory Visit (HOSPITAL_COMMUNITY): Payer: Self-pay | Admitting: Internal Medicine

## 2018-06-04 ENCOUNTER — Telehealth (HOSPITAL_COMMUNITY): Payer: Self-pay | Admitting: *Deleted

## 2018-06-04 DIAGNOSIS — E43 Unspecified severe protein-calorie malnutrition: Secondary | ICD-10-CM | POA: Diagnosis not present

## 2018-06-04 DIAGNOSIS — N184 Chronic kidney disease, stage 4 (severe): Secondary | ICD-10-CM | POA: Diagnosis not present

## 2018-06-04 DIAGNOSIS — J9690 Respiratory failure, unspecified, unspecified whether with hypoxia or hypercapnia: Secondary | ICD-10-CM | POA: Diagnosis not present

## 2018-06-04 DIAGNOSIS — D631 Anemia in chronic kidney disease: Secondary | ICD-10-CM | POA: Diagnosis not present

## 2018-06-04 DIAGNOSIS — I5042 Chronic combined systolic (congestive) and diastolic (congestive) heart failure: Secondary | ICD-10-CM | POA: Diagnosis not present

## 2018-06-04 DIAGNOSIS — I481 Persistent atrial fibrillation: Secondary | ICD-10-CM | POA: Diagnosis not present

## 2018-06-04 DIAGNOSIS — I130 Hypertensive heart and chronic kidney disease with heart failure and stage 1 through stage 4 chronic kidney disease, or unspecified chronic kidney disease: Secondary | ICD-10-CM | POA: Diagnosis not present

## 2018-06-04 NOTE — Telephone Encounter (Signed)
Pt's HHRN called to report pt's wt is up, last week she was 103 lb today she is at 106 lb, she states her abd girth is larger than normal.  Per Dr Haroldine Laws pt should take an extra 40 mg of Torsemide for 1 dose.  HHRN is aware and will advise caregiver.

## 2018-06-11 DIAGNOSIS — I481 Persistent atrial fibrillation: Secondary | ICD-10-CM | POA: Diagnosis not present

## 2018-06-11 DIAGNOSIS — I130 Hypertensive heart and chronic kidney disease with heart failure and stage 1 through stage 4 chronic kidney disease, or unspecified chronic kidney disease: Secondary | ICD-10-CM | POA: Diagnosis not present

## 2018-06-11 DIAGNOSIS — E43 Unspecified severe protein-calorie malnutrition: Secondary | ICD-10-CM | POA: Diagnosis not present

## 2018-06-11 DIAGNOSIS — J9690 Respiratory failure, unspecified, unspecified whether with hypoxia or hypercapnia: Secondary | ICD-10-CM | POA: Diagnosis not present

## 2018-06-11 DIAGNOSIS — N184 Chronic kidney disease, stage 4 (severe): Secondary | ICD-10-CM | POA: Diagnosis not present

## 2018-06-11 DIAGNOSIS — I5042 Chronic combined systolic (congestive) and diastolic (congestive) heart failure: Secondary | ICD-10-CM | POA: Diagnosis not present

## 2018-06-11 DIAGNOSIS — D631 Anemia in chronic kidney disease: Secondary | ICD-10-CM | POA: Diagnosis not present

## 2018-06-12 ENCOUNTER — Other Ambulatory Visit (HOSPITAL_COMMUNITY): Payer: Self-pay | Admitting: Internal Medicine

## 2018-06-12 ENCOUNTER — Telehealth (HOSPITAL_COMMUNITY): Payer: Self-pay

## 2018-06-12 NOTE — Telephone Encounter (Signed)
Increase torsemide to 60 bid for 2 days please.

## 2018-06-12 NOTE — Telephone Encounter (Signed)
Left VM

## 2018-06-12 NOTE — Telephone Encounter (Signed)
[facility] Nurse called to inform that pt has gained 4 lbs in last week. Pt eats 2 breakfasts and 2 lunches per Herschel Senegal. Herschel Senegal also stated that pt has dementia.

## 2018-06-15 DIAGNOSIS — D631 Anemia in chronic kidney disease: Secondary | ICD-10-CM | POA: Diagnosis not present

## 2018-06-15 DIAGNOSIS — I130 Hypertensive heart and chronic kidney disease with heart failure and stage 1 through stage 4 chronic kidney disease, or unspecified chronic kidney disease: Secondary | ICD-10-CM | POA: Diagnosis not present

## 2018-06-15 DIAGNOSIS — N184 Chronic kidney disease, stage 4 (severe): Secondary | ICD-10-CM | POA: Diagnosis not present

## 2018-06-15 DIAGNOSIS — I5042 Chronic combined systolic (congestive) and diastolic (congestive) heart failure: Secondary | ICD-10-CM | POA: Diagnosis not present

## 2018-06-15 DIAGNOSIS — J9690 Respiratory failure, unspecified, unspecified whether with hypoxia or hypercapnia: Secondary | ICD-10-CM | POA: Diagnosis not present

## 2018-06-15 DIAGNOSIS — I481 Persistent atrial fibrillation: Secondary | ICD-10-CM | POA: Diagnosis not present

## 2018-06-16 DIAGNOSIS — F419 Anxiety disorder, unspecified: Secondary | ICD-10-CM | POA: Diagnosis not present

## 2018-06-16 DIAGNOSIS — J9690 Respiratory failure, unspecified, unspecified whether with hypoxia or hypercapnia: Secondary | ICD-10-CM | POA: Diagnosis not present

## 2018-06-16 DIAGNOSIS — N184 Chronic kidney disease, stage 4 (severe): Secondary | ICD-10-CM | POA: Diagnosis not present

## 2018-06-16 DIAGNOSIS — Z96642 Presence of left artificial hip joint: Secondary | ICD-10-CM | POA: Diagnosis not present

## 2018-06-16 DIAGNOSIS — I5042 Chronic combined systolic (congestive) and diastolic (congestive) heart failure: Secondary | ICD-10-CM | POA: Diagnosis not present

## 2018-06-16 DIAGNOSIS — Z79891 Long term (current) use of opiate analgesic: Secondary | ICD-10-CM | POA: Diagnosis not present

## 2018-06-16 DIAGNOSIS — Z9181 History of falling: Secondary | ICD-10-CM | POA: Diagnosis not present

## 2018-06-16 DIAGNOSIS — I739 Peripheral vascular disease, unspecified: Secondary | ICD-10-CM | POA: Diagnosis not present

## 2018-06-16 DIAGNOSIS — I130 Hypertensive heart and chronic kidney disease with heart failure and stage 1 through stage 4 chronic kidney disease, or unspecified chronic kidney disease: Secondary | ICD-10-CM | POA: Diagnosis not present

## 2018-06-16 DIAGNOSIS — I2510 Atherosclerotic heart disease of native coronary artery without angina pectoris: Secondary | ICD-10-CM | POA: Diagnosis not present

## 2018-06-16 DIAGNOSIS — Z452 Encounter for adjustment and management of vascular access device: Secondary | ICD-10-CM | POA: Diagnosis not present

## 2018-06-16 DIAGNOSIS — Z7901 Long term (current) use of anticoagulants: Secondary | ICD-10-CM | POA: Diagnosis not present

## 2018-06-16 DIAGNOSIS — D631 Anemia in chronic kidney disease: Secondary | ICD-10-CM | POA: Diagnosis not present

## 2018-06-16 DIAGNOSIS — E43 Unspecified severe protein-calorie malnutrition: Secondary | ICD-10-CM | POA: Diagnosis not present

## 2018-06-16 DIAGNOSIS — I252 Old myocardial infarction: Secondary | ICD-10-CM | POA: Diagnosis not present

## 2018-06-16 DIAGNOSIS — I420 Dilated cardiomyopathy: Secondary | ICD-10-CM | POA: Diagnosis not present

## 2018-06-16 DIAGNOSIS — I481 Persistent atrial fibrillation: Secondary | ICD-10-CM | POA: Diagnosis not present

## 2018-06-16 DIAGNOSIS — E782 Mixed hyperlipidemia: Secondary | ICD-10-CM | POA: Diagnosis not present

## 2018-06-16 DIAGNOSIS — F0390 Unspecified dementia without behavioral disturbance: Secondary | ICD-10-CM | POA: Diagnosis not present

## 2018-06-18 DIAGNOSIS — D631 Anemia in chronic kidney disease: Secondary | ICD-10-CM | POA: Diagnosis not present

## 2018-06-18 DIAGNOSIS — I481 Persistent atrial fibrillation: Secondary | ICD-10-CM | POA: Diagnosis not present

## 2018-06-18 DIAGNOSIS — N184 Chronic kidney disease, stage 4 (severe): Secondary | ICD-10-CM | POA: Diagnosis not present

## 2018-06-18 DIAGNOSIS — I5042 Chronic combined systolic (congestive) and diastolic (congestive) heart failure: Secondary | ICD-10-CM | POA: Diagnosis not present

## 2018-06-18 DIAGNOSIS — I130 Hypertensive heart and chronic kidney disease with heart failure and stage 1 through stage 4 chronic kidney disease, or unspecified chronic kidney disease: Secondary | ICD-10-CM | POA: Diagnosis not present

## 2018-06-18 DIAGNOSIS — J9690 Respiratory failure, unspecified, unspecified whether with hypoxia or hypercapnia: Secondary | ICD-10-CM | POA: Diagnosis not present

## 2018-06-24 ENCOUNTER — Other Ambulatory Visit (HOSPITAL_COMMUNITY): Payer: Self-pay | Admitting: Internal Medicine

## 2018-06-25 DIAGNOSIS — I130 Hypertensive heart and chronic kidney disease with heart failure and stage 1 through stage 4 chronic kidney disease, or unspecified chronic kidney disease: Secondary | ICD-10-CM | POA: Diagnosis not present

## 2018-06-25 DIAGNOSIS — I481 Persistent atrial fibrillation: Secondary | ICD-10-CM | POA: Diagnosis not present

## 2018-06-25 DIAGNOSIS — N184 Chronic kidney disease, stage 4 (severe): Secondary | ICD-10-CM | POA: Diagnosis not present

## 2018-06-25 DIAGNOSIS — H1840 Unspecified corneal degeneration: Secondary | ICD-10-CM | POA: Diagnosis not present

## 2018-06-25 DIAGNOSIS — I5042 Chronic combined systolic (congestive) and diastolic (congestive) heart failure: Secondary | ICD-10-CM | POA: Diagnosis not present

## 2018-06-25 DIAGNOSIS — D631 Anemia in chronic kidney disease: Secondary | ICD-10-CM | POA: Diagnosis not present

## 2018-06-25 DIAGNOSIS — E43 Unspecified severe protein-calorie malnutrition: Secondary | ICD-10-CM | POA: Diagnosis not present

## 2018-06-25 DIAGNOSIS — J9690 Respiratory failure, unspecified, unspecified whether with hypoxia or hypercapnia: Secondary | ICD-10-CM | POA: Diagnosis not present

## 2018-07-01 ENCOUNTER — Other Ambulatory Visit (HOSPITAL_COMMUNITY): Payer: Self-pay | Admitting: Internal Medicine

## 2018-07-02 DIAGNOSIS — I5042 Chronic combined systolic (congestive) and diastolic (congestive) heart failure: Secondary | ICD-10-CM | POA: Diagnosis not present

## 2018-07-02 DIAGNOSIS — I130 Hypertensive heart and chronic kidney disease with heart failure and stage 1 through stage 4 chronic kidney disease, or unspecified chronic kidney disease: Secondary | ICD-10-CM | POA: Diagnosis not present

## 2018-07-02 DIAGNOSIS — D631 Anemia in chronic kidney disease: Secondary | ICD-10-CM | POA: Diagnosis not present

## 2018-07-02 DIAGNOSIS — I481 Persistent atrial fibrillation: Secondary | ICD-10-CM | POA: Diagnosis not present

## 2018-07-02 DIAGNOSIS — N184 Chronic kidney disease, stage 4 (severe): Secondary | ICD-10-CM | POA: Diagnosis not present

## 2018-07-02 DIAGNOSIS — J9690 Respiratory failure, unspecified, unspecified whether with hypoxia or hypercapnia: Secondary | ICD-10-CM | POA: Diagnosis not present

## 2018-07-09 DIAGNOSIS — N184 Chronic kidney disease, stage 4 (severe): Secondary | ICD-10-CM | POA: Diagnosis not present

## 2018-07-09 DIAGNOSIS — I130 Hypertensive heart and chronic kidney disease with heart failure and stage 1 through stage 4 chronic kidney disease, or unspecified chronic kidney disease: Secondary | ICD-10-CM | POA: Diagnosis not present

## 2018-07-09 DIAGNOSIS — D631 Anemia in chronic kidney disease: Secondary | ICD-10-CM | POA: Diagnosis not present

## 2018-07-09 DIAGNOSIS — I5042 Chronic combined systolic (congestive) and diastolic (congestive) heart failure: Secondary | ICD-10-CM | POA: Diagnosis not present

## 2018-07-09 DIAGNOSIS — I481 Persistent atrial fibrillation: Secondary | ICD-10-CM | POA: Diagnosis not present

## 2018-07-09 DIAGNOSIS — J9690 Respiratory failure, unspecified, unspecified whether with hypoxia or hypercapnia: Secondary | ICD-10-CM | POA: Diagnosis not present

## 2018-07-15 ENCOUNTER — Encounter: Payer: Self-pay | Admitting: Podiatry

## 2018-07-15 ENCOUNTER — Ambulatory Visit (INDEPENDENT_AMBULATORY_CARE_PROVIDER_SITE_OTHER): Payer: Medicare Other | Admitting: Podiatry

## 2018-07-15 DIAGNOSIS — M79676 Pain in unspecified toe(s): Secondary | ICD-10-CM

## 2018-07-15 DIAGNOSIS — L84 Corns and callosities: Secondary | ICD-10-CM | POA: Diagnosis not present

## 2018-07-15 DIAGNOSIS — B351 Tinea unguium: Secondary | ICD-10-CM

## 2018-07-15 NOTE — Progress Notes (Signed)
This patient presents to the office with chief complaint of painful long thick nails.  She says the nail on her third toe, right foot is especially painful walking and wearing her shoes.   She presents the office today with her nurse.  She presents the office today for preventative foot care services. Patient is taking eliquiss.   General Appearance  Alert, conversant and in no acute stress.  Vascular  Dorsalis pedis and posterior tibial  pulses are weakly  palpable  bilaterally.  Capillary return is within normal limits  bilaterally. Cold feet noted   bilaterally.  Neurologic  Senn-Weinstein monofilament wire test within normal limits  bilaterally. Muscle power within normal limits bilaterally.  Nails Thick disfigured discolored nails with subungual debris  from hallux to fifth toes bilaterally. No evidence of bacterial infection or drainage bilaterally.  Orthopedic  No limitations of motion of motion feet .  No crepitus or effusions noted.  HAV 1st MPJ  B/L and tailors bunion  B/L.    Skin  normotropic skin with no porokeratosis noted bilaterally.  No signs of infections or ulcers noted.  Distal clavi 3rd toe right foot.   Onychomycosis  B/L.  Clavi 3rd toe right foot.  Debride nails  X 10.  Debride clavi 3rd toe right foot.  Padding dispensed.  RTC 3 months  ABN signed for 2019. I finished and she remarked that I can smile after hurting her during my service on third toe clavi debridement.  Gardiner Barefoot DPM

## 2018-07-16 DIAGNOSIS — N184 Chronic kidney disease, stage 4 (severe): Secondary | ICD-10-CM | POA: Diagnosis not present

## 2018-07-16 DIAGNOSIS — J9690 Respiratory failure, unspecified, unspecified whether with hypoxia or hypercapnia: Secondary | ICD-10-CM | POA: Diagnosis not present

## 2018-07-16 DIAGNOSIS — I130 Hypertensive heart and chronic kidney disease with heart failure and stage 1 through stage 4 chronic kidney disease, or unspecified chronic kidney disease: Secondary | ICD-10-CM | POA: Diagnosis not present

## 2018-07-16 DIAGNOSIS — I481 Persistent atrial fibrillation: Secondary | ICD-10-CM | POA: Diagnosis not present

## 2018-07-16 DIAGNOSIS — D631 Anemia in chronic kidney disease: Secondary | ICD-10-CM | POA: Diagnosis not present

## 2018-07-16 DIAGNOSIS — I5042 Chronic combined systolic (congestive) and diastolic (congestive) heart failure: Secondary | ICD-10-CM | POA: Diagnosis not present

## 2018-07-21 ENCOUNTER — Other Ambulatory Visit (HOSPITAL_COMMUNITY): Payer: Self-pay | Admitting: Internal Medicine

## 2018-07-23 DIAGNOSIS — I5042 Chronic combined systolic (congestive) and diastolic (congestive) heart failure: Secondary | ICD-10-CM | POA: Diagnosis not present

## 2018-07-23 DIAGNOSIS — D631 Anemia in chronic kidney disease: Secondary | ICD-10-CM | POA: Diagnosis not present

## 2018-07-23 DIAGNOSIS — I481 Persistent atrial fibrillation: Secondary | ICD-10-CM | POA: Diagnosis not present

## 2018-07-23 DIAGNOSIS — J9690 Respiratory failure, unspecified, unspecified whether with hypoxia or hypercapnia: Secondary | ICD-10-CM | POA: Diagnosis not present

## 2018-07-23 DIAGNOSIS — I130 Hypertensive heart and chronic kidney disease with heart failure and stage 1 through stage 4 chronic kidney disease, or unspecified chronic kidney disease: Secondary | ICD-10-CM | POA: Diagnosis not present

## 2018-07-23 DIAGNOSIS — N184 Chronic kidney disease, stage 4 (severe): Secondary | ICD-10-CM | POA: Diagnosis not present

## 2018-07-30 ENCOUNTER — Other Ambulatory Visit (HOSPITAL_COMMUNITY): Payer: Self-pay | Admitting: Internal Medicine

## 2018-07-30 DIAGNOSIS — D631 Anemia in chronic kidney disease: Secondary | ICD-10-CM | POA: Diagnosis not present

## 2018-07-30 DIAGNOSIS — I5042 Chronic combined systolic (congestive) and diastolic (congestive) heart failure: Secondary | ICD-10-CM | POA: Diagnosis not present

## 2018-07-30 DIAGNOSIS — I130 Hypertensive heart and chronic kidney disease with heart failure and stage 1 through stage 4 chronic kidney disease, or unspecified chronic kidney disease: Secondary | ICD-10-CM | POA: Diagnosis not present

## 2018-07-30 DIAGNOSIS — N184 Chronic kidney disease, stage 4 (severe): Secondary | ICD-10-CM | POA: Diagnosis not present

## 2018-07-30 DIAGNOSIS — I481 Persistent atrial fibrillation: Secondary | ICD-10-CM | POA: Diagnosis not present

## 2018-07-30 DIAGNOSIS — J9690 Respiratory failure, unspecified, unspecified whether with hypoxia or hypercapnia: Secondary | ICD-10-CM | POA: Diagnosis not present

## 2018-08-05 DIAGNOSIS — I5042 Chronic combined systolic (congestive) and diastolic (congestive) heart failure: Secondary | ICD-10-CM | POA: Diagnosis not present

## 2018-08-05 DIAGNOSIS — J9690 Respiratory failure, unspecified, unspecified whether with hypoxia or hypercapnia: Secondary | ICD-10-CM | POA: Diagnosis not present

## 2018-08-05 DIAGNOSIS — N184 Chronic kidney disease, stage 4 (severe): Secondary | ICD-10-CM | POA: Diagnosis not present

## 2018-08-05 DIAGNOSIS — D631 Anemia in chronic kidney disease: Secondary | ICD-10-CM | POA: Diagnosis not present

## 2018-08-05 DIAGNOSIS — I130 Hypertensive heart and chronic kidney disease with heart failure and stage 1 through stage 4 chronic kidney disease, or unspecified chronic kidney disease: Secondary | ICD-10-CM | POA: Diagnosis not present

## 2018-08-05 DIAGNOSIS — I481 Persistent atrial fibrillation: Secondary | ICD-10-CM | POA: Diagnosis not present

## 2018-08-07 DIAGNOSIS — I130 Hypertensive heart and chronic kidney disease with heart failure and stage 1 through stage 4 chronic kidney disease, or unspecified chronic kidney disease: Secondary | ICD-10-CM | POA: Diagnosis not present

## 2018-08-07 DIAGNOSIS — I481 Persistent atrial fibrillation: Secondary | ICD-10-CM | POA: Diagnosis not present

## 2018-08-07 DIAGNOSIS — D631 Anemia in chronic kidney disease: Secondary | ICD-10-CM | POA: Diagnosis not present

## 2018-08-07 DIAGNOSIS — I5042 Chronic combined systolic (congestive) and diastolic (congestive) heart failure: Secondary | ICD-10-CM | POA: Diagnosis not present

## 2018-08-07 DIAGNOSIS — N184 Chronic kidney disease, stage 4 (severe): Secondary | ICD-10-CM | POA: Diagnosis not present

## 2018-08-07 DIAGNOSIS — J9690 Respiratory failure, unspecified, unspecified whether with hypoxia or hypercapnia: Secondary | ICD-10-CM | POA: Diagnosis not present

## 2018-08-13 DIAGNOSIS — D631 Anemia in chronic kidney disease: Secondary | ICD-10-CM | POA: Diagnosis not present

## 2018-08-13 DIAGNOSIS — I481 Persistent atrial fibrillation: Secondary | ICD-10-CM | POA: Diagnosis not present

## 2018-08-13 DIAGNOSIS — J9690 Respiratory failure, unspecified, unspecified whether with hypoxia or hypercapnia: Secondary | ICD-10-CM | POA: Diagnosis not present

## 2018-08-13 DIAGNOSIS — I4819 Other persistent atrial fibrillation: Secondary | ICD-10-CM | POA: Diagnosis not present

## 2018-08-13 DIAGNOSIS — N184 Chronic kidney disease, stage 4 (severe): Secondary | ICD-10-CM | POA: Diagnosis not present

## 2018-08-13 DIAGNOSIS — I130 Hypertensive heart and chronic kidney disease with heart failure and stage 1 through stage 4 chronic kidney disease, or unspecified chronic kidney disease: Secondary | ICD-10-CM | POA: Diagnosis not present

## 2018-08-13 DIAGNOSIS — I5042 Chronic combined systolic (congestive) and diastolic (congestive) heart failure: Secondary | ICD-10-CM | POA: Diagnosis not present

## 2018-08-14 DIAGNOSIS — I5020 Unspecified systolic (congestive) heart failure: Secondary | ICD-10-CM | POA: Diagnosis not present

## 2018-08-14 DIAGNOSIS — F0390 Unspecified dementia without behavioral disturbance: Secondary | ICD-10-CM | POA: Diagnosis not present

## 2018-08-14 DIAGNOSIS — Z23 Encounter for immunization: Secondary | ICD-10-CM | POA: Diagnosis not present

## 2018-08-15 DIAGNOSIS — I4819 Other persistent atrial fibrillation: Secondary | ICD-10-CM | POA: Diagnosis not present

## 2018-08-15 DIAGNOSIS — I5042 Chronic combined systolic (congestive) and diastolic (congestive) heart failure: Secondary | ICD-10-CM | POA: Diagnosis not present

## 2018-08-15 DIAGNOSIS — I2510 Atherosclerotic heart disease of native coronary artery without angina pectoris: Secondary | ICD-10-CM | POA: Diagnosis not present

## 2018-08-15 DIAGNOSIS — F419 Anxiety disorder, unspecified: Secondary | ICD-10-CM | POA: Diagnosis not present

## 2018-08-15 DIAGNOSIS — Z9181 History of falling: Secondary | ICD-10-CM | POA: Diagnosis not present

## 2018-08-15 DIAGNOSIS — I739 Peripheral vascular disease, unspecified: Secondary | ICD-10-CM | POA: Diagnosis not present

## 2018-08-15 DIAGNOSIS — I130 Hypertensive heart and chronic kidney disease with heart failure and stage 1 through stage 4 chronic kidney disease, or unspecified chronic kidney disease: Secondary | ICD-10-CM | POA: Diagnosis not present

## 2018-08-15 DIAGNOSIS — I252 Old myocardial infarction: Secondary | ICD-10-CM | POA: Diagnosis not present

## 2018-08-15 DIAGNOSIS — N184 Chronic kidney disease, stage 4 (severe): Secondary | ICD-10-CM | POA: Diagnosis not present

## 2018-08-15 DIAGNOSIS — Z452 Encounter for adjustment and management of vascular access device: Secondary | ICD-10-CM | POA: Diagnosis not present

## 2018-08-15 DIAGNOSIS — Z7901 Long term (current) use of anticoagulants: Secondary | ICD-10-CM | POA: Diagnosis not present

## 2018-08-15 DIAGNOSIS — J9690 Respiratory failure, unspecified, unspecified whether with hypoxia or hypercapnia: Secondary | ICD-10-CM | POA: Diagnosis not present

## 2018-08-15 DIAGNOSIS — E43 Unspecified severe protein-calorie malnutrition: Secondary | ICD-10-CM | POA: Diagnosis not present

## 2018-08-15 DIAGNOSIS — I420 Dilated cardiomyopathy: Secondary | ICD-10-CM | POA: Diagnosis not present

## 2018-08-15 DIAGNOSIS — F0390 Unspecified dementia without behavioral disturbance: Secondary | ICD-10-CM | POA: Diagnosis not present

## 2018-08-15 DIAGNOSIS — Z96642 Presence of left artificial hip joint: Secondary | ICD-10-CM | POA: Diagnosis not present

## 2018-08-15 DIAGNOSIS — D631 Anemia in chronic kidney disease: Secondary | ICD-10-CM | POA: Diagnosis not present

## 2018-08-15 DIAGNOSIS — E782 Mixed hyperlipidemia: Secondary | ICD-10-CM | POA: Diagnosis not present

## 2018-08-17 ENCOUNTER — Ambulatory Visit (HOSPITAL_COMMUNITY)
Admission: RE | Admit: 2018-08-17 | Discharge: 2018-08-17 | Disposition: A | Discharge status: Home / Self Care | Payer: Medicare Other | Source: Ambulatory Visit | Attending: Internal Medicine | Admitting: Internal Medicine

## 2018-08-17 ENCOUNTER — Other Ambulatory Visit: Payer: Self-pay

## 2018-08-17 ENCOUNTER — Encounter (HOSPITAL_COMMUNITY): Payer: Self-pay | Admitting: Internal Medicine

## 2018-08-17 VITALS — BP 105/65 | HR 75 | Wt 106.8 lb

## 2018-08-17 DIAGNOSIS — R7881 Bacteremia: Secondary | ICD-10-CM | POA: Diagnosis not present

## 2018-08-17 DIAGNOSIS — Z955 Presence of coronary angioplasty implant and graft: Secondary | ICD-10-CM | POA: Insufficient documentation

## 2018-08-17 DIAGNOSIS — Z7901 Long term (current) use of anticoagulants: Secondary | ICD-10-CM | POA: Insufficient documentation

## 2018-08-17 DIAGNOSIS — F0390 Unspecified dementia without behavioral disturbance: Secondary | ICD-10-CM | POA: Insufficient documentation

## 2018-08-17 DIAGNOSIS — I480 Paroxysmal atrial fibrillation: Secondary | ICD-10-CM | POA: Insufficient documentation

## 2018-08-17 DIAGNOSIS — Z79899 Other long term (current) drug therapy: Secondary | ICD-10-CM | POA: Diagnosis not present

## 2018-08-17 DIAGNOSIS — I5022 Chronic systolic (congestive) heart failure: Secondary | ICD-10-CM | POA: Diagnosis not present

## 2018-08-17 DIAGNOSIS — I2510 Atherosclerotic heart disease of native coronary artery without angina pectoris: Secondary | ICD-10-CM | POA: Diagnosis not present

## 2018-08-17 DIAGNOSIS — Z8249 Family history of ischemic heart disease and other diseases of the circulatory system: Secondary | ICD-10-CM | POA: Insufficient documentation

## 2018-08-17 DIAGNOSIS — I110 Hypertensive heart disease with heart failure: Secondary | ICD-10-CM | POA: Diagnosis not present

## 2018-08-17 DIAGNOSIS — I428 Other cardiomyopathies: Secondary | ICD-10-CM | POA: Insufficient documentation

## 2018-08-17 DIAGNOSIS — I350 Nonrheumatic aortic (valve) stenosis: Secondary | ICD-10-CM | POA: Diagnosis not present

## 2018-08-17 NOTE — Progress Notes (Signed)
Advanced Heart Failure Clinic Note    Patient ID: Jacqueline Reilly, female   DOB: June 19, 1924, 84 y.o.   MRN: [id number] PCP: [name] CHF: Bensimhon  HPI: Jacqueline Reilly is a 84 y.o. female with CAD, AAA, systolic HF, mild dementia, afib and CAD. She has had two previous coronary interventions including a cutting balloon to her D2 in 2007 and a DES to her mid LCX in 2010. Her LV dysfunction has been out of proportion to her CAD.  Last echo in 2/18 showed EF 20-25% with severe LV dilation.  Admitted 7/11-7/24/15 with syncope and dyspnea. She had new onset A-fib/RVR. Troponin was 1.03> 1.59> 2.18 . She was placed on amiodarone and heparin drip. She developed respiratory distress and required intubation. She converted to NSR but later was bradycardiac with NSVT. Amiodarone temporarily stopped but restarted after hyperkalemia resolved. Hyperkalemia was thought to be contributing to bradycardia. Diuresed with IV lasix. Co-ox 29% and started on milrinone which we tried to wean off but she did not tolerate. Discharge weight 87 lbs.   Admitted in November 2016 for Gram-negative bacteremia ([place]) - enterobacter asburiae from PICC. Treated with ceftriaxone and then switched to oral levofloxacin for a total of 21 weeks and PICC replaced.   She has remained on milrinone for >3 years now.   Had hip fracture and underwent repair in 5/18.  Admitted in 8/18 with bacteremia. Blood cultures + for enterobacter and pan-sensitive ecoli species.Treated with vancomycin and Zosyn, patient was later changed to rocephin.   She presents today for regular follow up. Remains on milrinone. Feels great overall, and asks multiple times why she has to keep coming back for check ups. Denies SOB or CP. Nixon aide here. States she can ambulate and do chores around the house without difficult. No difficulties with milrinone infusion, pump, or PICC line. No fevers or chills. No redness or drainage around site.   EKG today: NSR 74  bpm, personally reviewed  Echo 8/16  EF 25% with diffuse hypokinesis, mild AS/mild AI, RV mildly dilated with moderately decreased systolic function.  Echo 2/18 EF 25-30% Echo 8/18 EF 25-30%  Review of systems complete and found to be negative unless listed in HPI.    SH:  Social History   Socioeconomic History  . Marital status: Married    Spouse name: Not on file  . Number of children: 1  . Years of education: Not on file  . Highest education level: Not on file  Occupational History  . Occupation: retired  Scientific laboratory technician  . Financial resource strain: Not on file  . Food insecurity:    Worry: Not on file    Inability: Not on file  . Transportation needs:    Medical: Not on file    Non-medical: Not on file  Tobacco Use  . Smoking status: Never Smoker  . Smokeless tobacco: Never Used  Substance and Sexual Activity  . Alcohol use: Yes    Comment: occasionally  . Drug use: No  . Sexual activity: Not Currently  Lifestyle  . Physical activity:    Days per week: Not on file    Minutes per session: Not on file  . Stress: Not on file  Relationships  . Social connections:    Talks on phone: Not on file    Gets together: Not on file    Attends religious service: Not on file    Active member of club or organization: Not on file    Attends meetings  of clubs or organizations: Not on file    Relationship status: Not on file  . Intimate partner violence:    Fear of current or ex partner: Not on file    Emotionally abused: Not on file    Physically abused: Not on file    Forced sexual activity: Not on file  Other Topics Concern  . Not on file  Social History Narrative   She lives in [place], family helps with her care.    FH:  Family History  Problem Relation Age of Onset  . Heart disease Mother   . Heart disease Father   . Heart disease Brother        x 3    Past Medical History:  Diagnosis Date  . AAA (abdominal aortic aneurysm) ([place])   . Atrial fibrillation  ([place])   . Chronic systolic heart failure ([place])    a. ECHO (04/2014) EF 20-25%, diff HK, mild MR  . Coronary artery disease 2007   moderate ASCAD of the left system s/p PCI of the D2 and PCI of the left circ 03/2009  . Hyperlipidemia   . Hypertension   . PVD (peripheral vascular disease) ([place])   . Renal artery stenosis ([place])   . Ventricular dysfunction    left; ischemic    Current Outpatient Medications  Medication Sig Dispense Refill  . acetaminophen (TYLENOL) 500 MG tablet Take 1,000 mg by mouth 3 (three) times daily.    Marland Kitchen apixaban (ELIQUIS) 2.5 MG TABS tablet Take 1 tablet (2.5 mg total) by mouth 2 (two) times daily. 180 tablet 3  . busPIRone (BUSPAR) 5 MG tablet Take 5 mg by mouth daily.     . cefUROXime (CEFTIN) 500 MG tablet Take 1 tablet (500 mg total) by mouth daily. 8 tablet 0  . lisinopril (PRINIVIL,ZESTRIL) 2.5 MG tablet Take 2.5 mg by mouth daily.  1  . milrinone (PRIMACOR) 20 MG/100 ML SOLN infusion Inject 0.125 mcg/kg/min into the vein continuous. Rate 1.6 ml/hr Weight= 43 kg    . Multiple Vitamin (MULTIVITAMIN) tablet Take 1 tablet by mouth daily. 30 tablet 1  . OLANZapine (ZYPREXA) 2.5 MG tablet     . Omega-3 Fatty Acids (FISH OIL) 1000 MG CAPS Take 1 capsule by mouth 2 (two) times daily.     Marland Kitchen PACERONE 200 MG tablet 1/2 TABLET ONCE A DAY ORALLY 90  1  . torsemide (DEMADEX) 20 MG tablet Take 2 tablets (40 mg total) by mouth 2 (two) times daily. 60 tablet 0   No current facility-administered medications for this encounter.     Vitals:   08/17/18 1043  BP: 105/65  Pulse: 75  SpO2: 97%  Weight: 48.4 kg (106 lb 12.8 oz)    Wt Readings from Last 3 Encounters:  08/17/18 48.4 kg (106 lb 12.8 oz)  01/10/18 44 kg (97 lb)  01/01/18 44.1 kg (97 lb 4 oz)   PHYSICAL EXAM: General: Elderly frail. NAD.  HEENT: Normal Neck: Supple. JVP 5-6. Carotids 2+ bilat; no bruits. No thyromegaly or nodule noted. Cor: PMI nondisplaced. RRR, 2/6 SEM RUSB Lungs: CTAB, normal  effort. Abdomen: Soft, non-tender, non-distended, no HSM. No bruits or masses. +BS  Extremities: No cyanosis, clubbing, or rash. RUE PICC site stable.  Neuro: Alert & orientedx3, cranial nerves grossly intact. moves all 4 extremities w/o difficulty. Affect pleasant   ASSESSMENT & PLAN:   1) Chronic Systolic Heart Failure: Mixed ischemic/nonischemic cardiomyopathy (degree of LV dysfunction is out of proportion to CAD), EF 25%  with moderate RV systolic dysfunction .  Echo  8/18 EF 25-30% Mild AS - NYHA II-III symptoms chronically - Volume status stable on exam.  - Continue torsemide 40 mg BID - Continue lisinopril 2.5 mg daily  - Continue home milrinone 0.125 mcg/kg/min for palliative purposes. Has failed weans in the past.  - Continue HH aide and [facility] support 2) Atrial fibrillation:  - Paroxysmal.   - NSR on EKG today.  - Denies bleeding on apixaban 2.5 mg BID. (weight less than 60 kg and age >76).  - Continue amiodarone 100 mg daily.   3) Limited Code: No CPR or defibrillation 4) CAD:  - No s/s of ischemia.    - Off ASA with Eliquis. Intolerant of statins in past.  5) Dementia; - Stable.  6) Aortic stenosis - Mild by echo 8/18. Follow.   Doing well overall. Continue intermittent follow up with pt on Milrinone. Follow biweekly labs via [place], [place]  08/17/2018   Greater than 50% of the 30 minute visit was spent in counseling/coordination of care regarding disease state education, salt/fluid restriction, sliding scale diuretics, and medication compliance.

## 2018-08-20 DIAGNOSIS — I2510 Atherosclerotic heart disease of native coronary artery without angina pectoris: Secondary | ICD-10-CM | POA: Diagnosis not present

## 2018-08-20 DIAGNOSIS — I4819 Other persistent atrial fibrillation: Secondary | ICD-10-CM | POA: Diagnosis not present

## 2018-08-20 DIAGNOSIS — J9690 Respiratory failure, unspecified, unspecified whether with hypoxia or hypercapnia: Secondary | ICD-10-CM | POA: Diagnosis not present

## 2018-08-20 DIAGNOSIS — N184 Chronic kidney disease, stage 4 (severe): Secondary | ICD-10-CM | POA: Diagnosis not present

## 2018-08-20 DIAGNOSIS — I739 Peripheral vascular disease, unspecified: Secondary | ICD-10-CM | POA: Diagnosis not present

## 2018-08-20 DIAGNOSIS — I248 Other forms of acute ischemic heart disease: Secondary | ICD-10-CM | POA: Diagnosis not present

## 2018-08-20 DIAGNOSIS — I5042 Chronic combined systolic (congestive) and diastolic (congestive) heart failure: Secondary | ICD-10-CM | POA: Diagnosis not present

## 2018-08-20 DIAGNOSIS — E43 Unspecified severe protein-calorie malnutrition: Secondary | ICD-10-CM | POA: Diagnosis not present

## 2018-08-20 DIAGNOSIS — I420 Dilated cardiomyopathy: Secondary | ICD-10-CM | POA: Diagnosis not present

## 2018-08-20 DIAGNOSIS — D631 Anemia in chronic kidney disease: Secondary | ICD-10-CM | POA: Diagnosis not present

## 2018-08-20 DIAGNOSIS — I130 Hypertensive heart and chronic kidney disease with heart failure and stage 1 through stage 4 chronic kidney disease, or unspecified chronic kidney disease: Secondary | ICD-10-CM | POA: Diagnosis not present

## 2018-08-27 DIAGNOSIS — D631 Anemia in chronic kidney disease: Secondary | ICD-10-CM | POA: Diagnosis not present

## 2018-08-27 DIAGNOSIS — I2510 Atherosclerotic heart disease of native coronary artery without angina pectoris: Secondary | ICD-10-CM | POA: Diagnosis not present

## 2018-08-27 DIAGNOSIS — I4819 Other persistent atrial fibrillation: Secondary | ICD-10-CM | POA: Diagnosis not present

## 2018-08-27 DIAGNOSIS — I5042 Chronic combined systolic (congestive) and diastolic (congestive) heart failure: Secondary | ICD-10-CM | POA: Diagnosis not present

## 2018-08-27 DIAGNOSIS — I420 Dilated cardiomyopathy: Secondary | ICD-10-CM | POA: Diagnosis not present

## 2018-08-27 DIAGNOSIS — E43 Unspecified severe protein-calorie malnutrition: Secondary | ICD-10-CM | POA: Diagnosis not present

## 2018-08-27 DIAGNOSIS — I739 Peripheral vascular disease, unspecified: Secondary | ICD-10-CM | POA: Diagnosis not present

## 2018-08-27 DIAGNOSIS — N184 Chronic kidney disease, stage 4 (severe): Secondary | ICD-10-CM | POA: Diagnosis not present

## 2018-08-27 DIAGNOSIS — I248 Other forms of acute ischemic heart disease: Secondary | ICD-10-CM | POA: Diagnosis not present

## 2018-08-27 DIAGNOSIS — J9690 Respiratory failure, unspecified, unspecified whether with hypoxia or hypercapnia: Secondary | ICD-10-CM | POA: Diagnosis not present

## 2018-08-27 DIAGNOSIS — I130 Hypertensive heart and chronic kidney disease with heart failure and stage 1 through stage 4 chronic kidney disease, or unspecified chronic kidney disease: Secondary | ICD-10-CM | POA: Diagnosis not present

## 2018-09-02 ENCOUNTER — Other Ambulatory Visit (HOSPITAL_COMMUNITY): Payer: Self-pay | Admitting: Internal Medicine

## 2018-09-02 DIAGNOSIS — D631 Anemia in chronic kidney disease: Secondary | ICD-10-CM | POA: Diagnosis not present

## 2018-09-02 DIAGNOSIS — N184 Chronic kidney disease, stage 4 (severe): Secondary | ICD-10-CM | POA: Diagnosis not present

## 2018-09-02 DIAGNOSIS — I130 Hypertensive heart and chronic kidney disease with heart failure and stage 1 through stage 4 chronic kidney disease, or unspecified chronic kidney disease: Secondary | ICD-10-CM | POA: Diagnosis not present

## 2018-09-02 DIAGNOSIS — I4819 Other persistent atrial fibrillation: Secondary | ICD-10-CM | POA: Diagnosis not present

## 2018-09-02 DIAGNOSIS — I5042 Chronic combined systolic (congestive) and diastolic (congestive) heart failure: Secondary | ICD-10-CM | POA: Diagnosis not present

## 2018-09-02 DIAGNOSIS — J9690 Respiratory failure, unspecified, unspecified whether with hypoxia or hypercapnia: Secondary | ICD-10-CM | POA: Diagnosis not present

## 2018-09-09 DIAGNOSIS — J9690 Respiratory failure, unspecified, unspecified whether with hypoxia or hypercapnia: Secondary | ICD-10-CM | POA: Diagnosis not present

## 2018-09-09 DIAGNOSIS — I4819 Other persistent atrial fibrillation: Secondary | ICD-10-CM | POA: Diagnosis not present

## 2018-09-09 DIAGNOSIS — I130 Hypertensive heart and chronic kidney disease with heart failure and stage 1 through stage 4 chronic kidney disease, or unspecified chronic kidney disease: Secondary | ICD-10-CM | POA: Diagnosis not present

## 2018-09-09 DIAGNOSIS — I5042 Chronic combined systolic (congestive) and diastolic (congestive) heart failure: Secondary | ICD-10-CM | POA: Diagnosis not present

## 2018-09-09 DIAGNOSIS — D631 Anemia in chronic kidney disease: Secondary | ICD-10-CM | POA: Diagnosis not present

## 2018-09-09 DIAGNOSIS — N184 Chronic kidney disease, stage 4 (severe): Secondary | ICD-10-CM | POA: Diagnosis not present

## 2018-09-14 ENCOUNTER — Other Ambulatory Visit (HOSPITAL_COMMUNITY): Payer: Self-pay | Admitting: Internal Medicine

## 2018-09-16 DIAGNOSIS — I5042 Chronic combined systolic (congestive) and diastolic (congestive) heart failure: Secondary | ICD-10-CM | POA: Diagnosis not present

## 2018-09-16 DIAGNOSIS — J9690 Respiratory failure, unspecified, unspecified whether with hypoxia or hypercapnia: Secondary | ICD-10-CM | POA: Diagnosis not present

## 2018-09-16 DIAGNOSIS — N184 Chronic kidney disease, stage 4 (severe): Secondary | ICD-10-CM | POA: Diagnosis not present

## 2018-09-16 DIAGNOSIS — I4819 Other persistent atrial fibrillation: Secondary | ICD-10-CM | POA: Diagnosis not present

## 2018-09-16 DIAGNOSIS — D631 Anemia in chronic kidney disease: Secondary | ICD-10-CM | POA: Diagnosis not present

## 2018-09-16 DIAGNOSIS — I130 Hypertensive heart and chronic kidney disease with heart failure and stage 1 through stage 4 chronic kidney disease, or unspecified chronic kidney disease: Secondary | ICD-10-CM | POA: Diagnosis not present

## 2018-09-23 ENCOUNTER — Ambulatory Visit: Payer: Medicare Other | Admitting: Podiatry

## 2018-09-23 DIAGNOSIS — D631 Anemia in chronic kidney disease: Secondary | ICD-10-CM | POA: Diagnosis not present

## 2018-09-23 DIAGNOSIS — N184 Chronic kidney disease, stage 4 (severe): Secondary | ICD-10-CM | POA: Diagnosis not present

## 2018-09-23 DIAGNOSIS — I5042 Chronic combined systolic (congestive) and diastolic (congestive) heart failure: Secondary | ICD-10-CM | POA: Diagnosis not present

## 2018-09-23 DIAGNOSIS — I130 Hypertensive heart and chronic kidney disease with heart failure and stage 1 through stage 4 chronic kidney disease, or unspecified chronic kidney disease: Secondary | ICD-10-CM | POA: Diagnosis not present

## 2018-09-23 DIAGNOSIS — I4819 Other persistent atrial fibrillation: Secondary | ICD-10-CM | POA: Diagnosis not present

## 2018-09-23 DIAGNOSIS — J9690 Respiratory failure, unspecified, unspecified whether with hypoxia or hypercapnia: Secondary | ICD-10-CM | POA: Diagnosis not present

## 2018-09-29 ENCOUNTER — Ambulatory Visit (INDEPENDENT_AMBULATORY_CARE_PROVIDER_SITE_OTHER): Payer: Medicare Other | Admitting: Podiatry

## 2018-09-29 ENCOUNTER — Encounter: Payer: Self-pay | Admitting: Podiatry

## 2018-09-29 DIAGNOSIS — L84 Corns and callosities: Secondary | ICD-10-CM | POA: Diagnosis not present

## 2018-09-29 DIAGNOSIS — B351 Tinea unguium: Secondary | ICD-10-CM

## 2018-09-29 DIAGNOSIS — M79676 Pain in unspecified toe(s): Secondary | ICD-10-CM

## 2018-09-29 NOTE — Progress Notes (Signed)
This patient presents to the office with chief complaint of painful long thick nails.  She says the nail on her third toe, right foot is especially painful walking and wearing her shoes.   She presents the office today with her nurse.  She presents the office today for preventative foot care services. Patient is taking eliquiss.   General Appearance  Alert, conversant and in no acute stress.  Vascular  Dorsalis pedis and posterior tibial  pulses are weakly  palpable  bilaterally.  Capillary return is within normal limits  bilaterally. Cold feet noted   bilaterally.  Neurologic  Senn-Weinstein monofilament wire test within normal limits  bilaterally. Muscle power within normal limits bilaterally.  Nails Thick disfigured discolored nails with subungual debris  from hallux to fifth toes bilaterally. No evidence of bacterial infection or drainage bilaterally.  Orthopedic  No limitations of motion of motion feet .  No crepitus or effusions noted.  HAV 1st MPJ  B/L and tailors bunion  B/L.    Skin  normotropic skin with no porokeratosis noted bilaterally.  No signs of infections or ulcers noted.  Distal clavi 3rd toe right foot.   Onychomycosis  B/L.  Clavi 3rd toe right foot.  Debride nails  X 10.  Debride clavi 3rd toe right foot.  Padding dispensed.  RTC 3 months  ABN signed for 2019.   Gardiner Barefoot DPM

## 2018-10-01 ENCOUNTER — Other Ambulatory Visit (HOSPITAL_COMMUNITY): Payer: Self-pay | Admitting: Internal Medicine

## 2018-10-01 DIAGNOSIS — I2510 Atherosclerotic heart disease of native coronary artery without angina pectoris: Secondary | ICD-10-CM | POA: Diagnosis not present

## 2018-10-01 DIAGNOSIS — I4819 Other persistent atrial fibrillation: Secondary | ICD-10-CM | POA: Diagnosis not present

## 2018-10-01 DIAGNOSIS — E43 Unspecified severe protein-calorie malnutrition: Secondary | ICD-10-CM | POA: Diagnosis not present

## 2018-10-01 DIAGNOSIS — N184 Chronic kidney disease, stage 4 (severe): Secondary | ICD-10-CM | POA: Diagnosis not present

## 2018-10-01 DIAGNOSIS — D631 Anemia in chronic kidney disease: Secondary | ICD-10-CM | POA: Diagnosis not present

## 2018-10-01 DIAGNOSIS — I5042 Chronic combined systolic (congestive) and diastolic (congestive) heart failure: Secondary | ICD-10-CM | POA: Diagnosis not present

## 2018-10-01 DIAGNOSIS — I130 Hypertensive heart and chronic kidney disease with heart failure and stage 1 through stage 4 chronic kidney disease, or unspecified chronic kidney disease: Secondary | ICD-10-CM | POA: Diagnosis not present

## 2018-10-01 DIAGNOSIS — J9690 Respiratory failure, unspecified, unspecified whether with hypoxia or hypercapnia: Secondary | ICD-10-CM | POA: Diagnosis not present

## 2018-10-01 DIAGNOSIS — I739 Peripheral vascular disease, unspecified: Secondary | ICD-10-CM | POA: Diagnosis not present

## 2018-10-08 DIAGNOSIS — J9690 Respiratory failure, unspecified, unspecified whether with hypoxia or hypercapnia: Secondary | ICD-10-CM | POA: Diagnosis not present

## 2018-10-08 DIAGNOSIS — I5042 Chronic combined systolic (congestive) and diastolic (congestive) heart failure: Secondary | ICD-10-CM | POA: Diagnosis not present

## 2018-10-08 DIAGNOSIS — D631 Anemia in chronic kidney disease: Secondary | ICD-10-CM | POA: Diagnosis not present

## 2018-10-08 DIAGNOSIS — I4819 Other persistent atrial fibrillation: Secondary | ICD-10-CM | POA: Diagnosis not present

## 2018-10-08 DIAGNOSIS — I130 Hypertensive heart and chronic kidney disease with heart failure and stage 1 through stage 4 chronic kidney disease, or unspecified chronic kidney disease: Secondary | ICD-10-CM | POA: Diagnosis not present

## 2018-10-08 DIAGNOSIS — N184 Chronic kidney disease, stage 4 (severe): Secondary | ICD-10-CM | POA: Diagnosis not present

## 2018-10-14 DIAGNOSIS — I4819 Other persistent atrial fibrillation: Secondary | ICD-10-CM | POA: Diagnosis not present

## 2018-10-14 DIAGNOSIS — Z9181 History of falling: Secondary | ICD-10-CM | POA: Diagnosis not present

## 2018-10-14 DIAGNOSIS — I130 Hypertensive heart and chronic kidney disease with heart failure and stage 1 through stage 4 chronic kidney disease, or unspecified chronic kidney disease: Secondary | ICD-10-CM | POA: Diagnosis not present

## 2018-10-14 DIAGNOSIS — I252 Old myocardial infarction: Secondary | ICD-10-CM | POA: Diagnosis not present

## 2018-10-14 DIAGNOSIS — I739 Peripheral vascular disease, unspecified: Secondary | ICD-10-CM | POA: Diagnosis not present

## 2018-10-14 DIAGNOSIS — I5042 Chronic combined systolic (congestive) and diastolic (congestive) heart failure: Secondary | ICD-10-CM | POA: Diagnosis not present

## 2018-10-14 DIAGNOSIS — E782 Mixed hyperlipidemia: Secondary | ICD-10-CM | POA: Diagnosis not present

## 2018-10-14 DIAGNOSIS — Z96642 Presence of left artificial hip joint: Secondary | ICD-10-CM | POA: Diagnosis not present

## 2018-10-14 DIAGNOSIS — Z452 Encounter for adjustment and management of vascular access device: Secondary | ICD-10-CM | POA: Diagnosis not present

## 2018-10-14 DIAGNOSIS — E43 Unspecified severe protein-calorie malnutrition: Secondary | ICD-10-CM | POA: Diagnosis not present

## 2018-10-14 DIAGNOSIS — F419 Anxiety disorder, unspecified: Secondary | ICD-10-CM | POA: Diagnosis not present

## 2018-10-14 DIAGNOSIS — F0390 Unspecified dementia without behavioral disturbance: Secondary | ICD-10-CM | POA: Diagnosis not present

## 2018-10-14 DIAGNOSIS — Z7901 Long term (current) use of anticoagulants: Secondary | ICD-10-CM | POA: Diagnosis not present

## 2018-10-14 DIAGNOSIS — I420 Dilated cardiomyopathy: Secondary | ICD-10-CM | POA: Diagnosis not present

## 2018-10-14 DIAGNOSIS — D631 Anemia in chronic kidney disease: Secondary | ICD-10-CM | POA: Diagnosis not present

## 2018-10-14 DIAGNOSIS — I2510 Atherosclerotic heart disease of native coronary artery without angina pectoris: Secondary | ICD-10-CM | POA: Diagnosis not present

## 2018-10-14 DIAGNOSIS — J9690 Respiratory failure, unspecified, unspecified whether with hypoxia or hypercapnia: Secondary | ICD-10-CM | POA: Diagnosis not present

## 2018-10-14 DIAGNOSIS — N184 Chronic kidney disease, stage 4 (severe): Secondary | ICD-10-CM | POA: Diagnosis not present

## 2018-10-15 DIAGNOSIS — I5042 Chronic combined systolic (congestive) and diastolic (congestive) heart failure: Secondary | ICD-10-CM | POA: Diagnosis not present

## 2018-10-15 DIAGNOSIS — I130 Hypertensive heart and chronic kidney disease with heart failure and stage 1 through stage 4 chronic kidney disease, or unspecified chronic kidney disease: Secondary | ICD-10-CM | POA: Diagnosis not present

## 2018-10-15 DIAGNOSIS — D631 Anemia in chronic kidney disease: Secondary | ICD-10-CM | POA: Diagnosis not present

## 2018-10-15 DIAGNOSIS — J9690 Respiratory failure, unspecified, unspecified whether with hypoxia or hypercapnia: Secondary | ICD-10-CM | POA: Diagnosis not present

## 2018-10-15 DIAGNOSIS — I4819 Other persistent atrial fibrillation: Secondary | ICD-10-CM | POA: Diagnosis not present

## 2018-10-15 DIAGNOSIS — N184 Chronic kidney disease, stage 4 (severe): Secondary | ICD-10-CM | POA: Diagnosis not present

## 2018-10-16 ENCOUNTER — Other Ambulatory Visit (HOSPITAL_COMMUNITY): Payer: Self-pay | Admitting: Internal Medicine

## 2018-10-19 ENCOUNTER — Telehealth (HOSPITAL_COMMUNITY): Payer: Self-pay | Admitting: Cardiology

## 2018-10-19 NOTE — Telephone Encounter (Signed)
Abnormal labs received from [place] drawn 10/15/18 K 5.4 Cr 1.18 BUN 17 Na 143   Per Rebecca Eaton Will need repeat BMET STAT and confirm she is not taking potassium  Detailed message left on [phone] No answer unable to leave message [phone] Message left with [facility] to have labs repeated

## 2018-10-19 NOTE — Telephone Encounter (Signed)
[facility] aware and will have patients labs repeated

## 2018-10-22 DIAGNOSIS — J9690 Respiratory failure, unspecified, unspecified whether with hypoxia or hypercapnia: Secondary | ICD-10-CM | POA: Diagnosis not present

## 2018-10-22 DIAGNOSIS — I5042 Chronic combined systolic (congestive) and diastolic (congestive) heart failure: Secondary | ICD-10-CM | POA: Diagnosis not present

## 2018-10-22 DIAGNOSIS — I4819 Other persistent atrial fibrillation: Secondary | ICD-10-CM | POA: Diagnosis not present

## 2018-10-22 DIAGNOSIS — N184 Chronic kidney disease, stage 4 (severe): Secondary | ICD-10-CM | POA: Diagnosis not present

## 2018-10-22 DIAGNOSIS — I130 Hypertensive heart and chronic kidney disease with heart failure and stage 1 through stage 4 chronic kidney disease, or unspecified chronic kidney disease: Secondary | ICD-10-CM | POA: Diagnosis not present

## 2018-10-22 DIAGNOSIS — D631 Anemia in chronic kidney disease: Secondary | ICD-10-CM | POA: Diagnosis not present

## 2018-10-29 DIAGNOSIS — I420 Dilated cardiomyopathy: Secondary | ICD-10-CM | POA: Diagnosis not present

## 2018-10-29 DIAGNOSIS — D631 Anemia in chronic kidney disease: Secondary | ICD-10-CM | POA: Diagnosis not present

## 2018-10-29 DIAGNOSIS — I130 Hypertensive heart and chronic kidney disease with heart failure and stage 1 through stage 4 chronic kidney disease, or unspecified chronic kidney disease: Secondary | ICD-10-CM | POA: Diagnosis not present

## 2018-10-29 DIAGNOSIS — F0390 Unspecified dementia without behavioral disturbance: Secondary | ICD-10-CM | POA: Diagnosis not present

## 2018-10-29 DIAGNOSIS — I309 Acute pericarditis, unspecified: Secondary | ICD-10-CM | POA: Diagnosis not present

## 2018-10-29 DIAGNOSIS — I739 Peripheral vascular disease, unspecified: Secondary | ICD-10-CM | POA: Diagnosis not present

## 2018-10-29 DIAGNOSIS — J9690 Respiratory failure, unspecified, unspecified whether with hypoxia or hypercapnia: Secondary | ICD-10-CM | POA: Diagnosis not present

## 2018-10-29 DIAGNOSIS — N184 Chronic kidney disease, stage 4 (severe): Secondary | ICD-10-CM | POA: Diagnosis not present

## 2018-10-29 DIAGNOSIS — I5042 Chronic combined systolic (congestive) and diastolic (congestive) heart failure: Secondary | ICD-10-CM | POA: Diagnosis not present

## 2018-10-29 DIAGNOSIS — I4819 Other persistent atrial fibrillation: Secondary | ICD-10-CM | POA: Diagnosis not present

## 2018-11-03 ENCOUNTER — Other Ambulatory Visit (HOSPITAL_COMMUNITY): Payer: Self-pay | Admitting: Internal Medicine

## 2018-11-05 DIAGNOSIS — I130 Hypertensive heart and chronic kidney disease with heart failure and stage 1 through stage 4 chronic kidney disease, or unspecified chronic kidney disease: Secondary | ICD-10-CM | POA: Diagnosis not present

## 2018-11-05 DIAGNOSIS — J9690 Respiratory failure, unspecified, unspecified whether with hypoxia or hypercapnia: Secondary | ICD-10-CM | POA: Diagnosis not present

## 2018-11-05 DIAGNOSIS — I4819 Other persistent atrial fibrillation: Secondary | ICD-10-CM | POA: Diagnosis not present

## 2018-11-05 DIAGNOSIS — N184 Chronic kidney disease, stage 4 (severe): Secondary | ICD-10-CM | POA: Diagnosis not present

## 2018-11-05 DIAGNOSIS — D631 Anemia in chronic kidney disease: Secondary | ICD-10-CM | POA: Diagnosis not present

## 2018-11-05 DIAGNOSIS — I5042 Chronic combined systolic (congestive) and diastolic (congestive) heart failure: Secondary | ICD-10-CM | POA: Diagnosis not present

## 2018-11-12 ENCOUNTER — Other Ambulatory Visit (HOSPITAL_COMMUNITY): Payer: Self-pay | Admitting: Internal Medicine

## 2018-11-12 DIAGNOSIS — I130 Hypertensive heart and chronic kidney disease with heart failure and stage 1 through stage 4 chronic kidney disease, or unspecified chronic kidney disease: Secondary | ICD-10-CM | POA: Diagnosis not present

## 2018-11-12 DIAGNOSIS — D631 Anemia in chronic kidney disease: Secondary | ICD-10-CM | POA: Diagnosis not present

## 2018-11-12 DIAGNOSIS — I4819 Other persistent atrial fibrillation: Secondary | ICD-10-CM | POA: Diagnosis not present

## 2018-11-12 DIAGNOSIS — I5042 Chronic combined systolic (congestive) and diastolic (congestive) heart failure: Secondary | ICD-10-CM | POA: Diagnosis not present

## 2018-11-12 DIAGNOSIS — N184 Chronic kidney disease, stage 4 (severe): Secondary | ICD-10-CM | POA: Diagnosis not present

## 2018-11-12 DIAGNOSIS — J9690 Respiratory failure, unspecified, unspecified whether with hypoxia or hypercapnia: Secondary | ICD-10-CM | POA: Diagnosis not present

## 2018-11-18 DIAGNOSIS — R300 Dysuria: Secondary | ICD-10-CM | POA: Diagnosis not present

## 2018-11-19 DIAGNOSIS — N184 Chronic kidney disease, stage 4 (severe): Secondary | ICD-10-CM | POA: Diagnosis not present

## 2018-11-19 DIAGNOSIS — D631 Anemia in chronic kidney disease: Secondary | ICD-10-CM | POA: Diagnosis not present

## 2018-11-19 DIAGNOSIS — I130 Hypertensive heart and chronic kidney disease with heart failure and stage 1 through stage 4 chronic kidney disease, or unspecified chronic kidney disease: Secondary | ICD-10-CM | POA: Diagnosis not present

## 2018-11-19 DIAGNOSIS — J9690 Respiratory failure, unspecified, unspecified whether with hypoxia or hypercapnia: Secondary | ICD-10-CM | POA: Diagnosis not present

## 2018-11-19 DIAGNOSIS — I5042 Chronic combined systolic (congestive) and diastolic (congestive) heart failure: Secondary | ICD-10-CM | POA: Diagnosis not present

## 2018-11-19 DIAGNOSIS — I4819 Other persistent atrial fibrillation: Secondary | ICD-10-CM | POA: Diagnosis not present

## 2018-11-20 ENCOUNTER — Other Ambulatory Visit (HOSPITAL_COMMUNITY): Payer: Self-pay | Admitting: Internal Medicine

## 2018-11-26 DIAGNOSIS — I4819 Other persistent atrial fibrillation: Secondary | ICD-10-CM | POA: Diagnosis not present

## 2018-11-26 DIAGNOSIS — I130 Hypertensive heart and chronic kidney disease with heart failure and stage 1 through stage 4 chronic kidney disease, or unspecified chronic kidney disease: Secondary | ICD-10-CM | POA: Diagnosis not present

## 2018-11-26 DIAGNOSIS — N184 Chronic kidney disease, stage 4 (severe): Secondary | ICD-10-CM | POA: Diagnosis not present

## 2018-11-26 DIAGNOSIS — D631 Anemia in chronic kidney disease: Secondary | ICD-10-CM | POA: Diagnosis not present

## 2018-11-26 DIAGNOSIS — I5042 Chronic combined systolic (congestive) and diastolic (congestive) heart failure: Secondary | ICD-10-CM | POA: Diagnosis not present

## 2018-11-26 DIAGNOSIS — J9690 Respiratory failure, unspecified, unspecified whether with hypoxia or hypercapnia: Secondary | ICD-10-CM | POA: Diagnosis not present

## 2018-11-27 DIAGNOSIS — I480 Paroxysmal atrial fibrillation: Secondary | ICD-10-CM | POA: Diagnosis not present

## 2018-11-27 DIAGNOSIS — F0390 Unspecified dementia without behavioral disturbance: Secondary | ICD-10-CM | POA: Diagnosis not present

## 2018-11-27 DIAGNOSIS — Z1389 Encounter for screening for other disorder: Secondary | ICD-10-CM | POA: Diagnosis not present

## 2018-11-27 DIAGNOSIS — I5020 Unspecified systolic (congestive) heart failure: Secondary | ICD-10-CM | POA: Diagnosis not present

## 2018-11-27 DIAGNOSIS — Z0000 Encounter for general adult medical examination without abnormal findings: Secondary | ICD-10-CM | POA: Diagnosis not present

## 2018-12-03 DIAGNOSIS — D631 Anemia in chronic kidney disease: Secondary | ICD-10-CM | POA: Diagnosis not present

## 2018-12-03 DIAGNOSIS — I4819 Other persistent atrial fibrillation: Secondary | ICD-10-CM | POA: Diagnosis not present

## 2018-12-03 DIAGNOSIS — J9690 Respiratory failure, unspecified, unspecified whether with hypoxia or hypercapnia: Secondary | ICD-10-CM | POA: Diagnosis not present

## 2018-12-03 DIAGNOSIS — I5042 Chronic combined systolic (congestive) and diastolic (congestive) heart failure: Secondary | ICD-10-CM | POA: Diagnosis not present

## 2018-12-03 DIAGNOSIS — I130 Hypertensive heart and chronic kidney disease with heart failure and stage 1 through stage 4 chronic kidney disease, or unspecified chronic kidney disease: Secondary | ICD-10-CM | POA: Diagnosis not present

## 2018-12-03 DIAGNOSIS — N184 Chronic kidney disease, stage 4 (severe): Secondary | ICD-10-CM | POA: Diagnosis not present

## 2018-12-03 IMAGING — DX DG HIP (WITH OR WITHOUT PELVIS) 2-3V*L*
3 series · 3 of 3 positions shown · non-contrast
Comparison: Intraoperative radiograph dated 03/05/2017

CLINICAL DATA: [AGE] female with left hip pain and fever.
History of hip replacement.

EXAM:
DG HIP (WITH OR WITHOUT PELVIS) 2-3V LEFT

[hip ap]
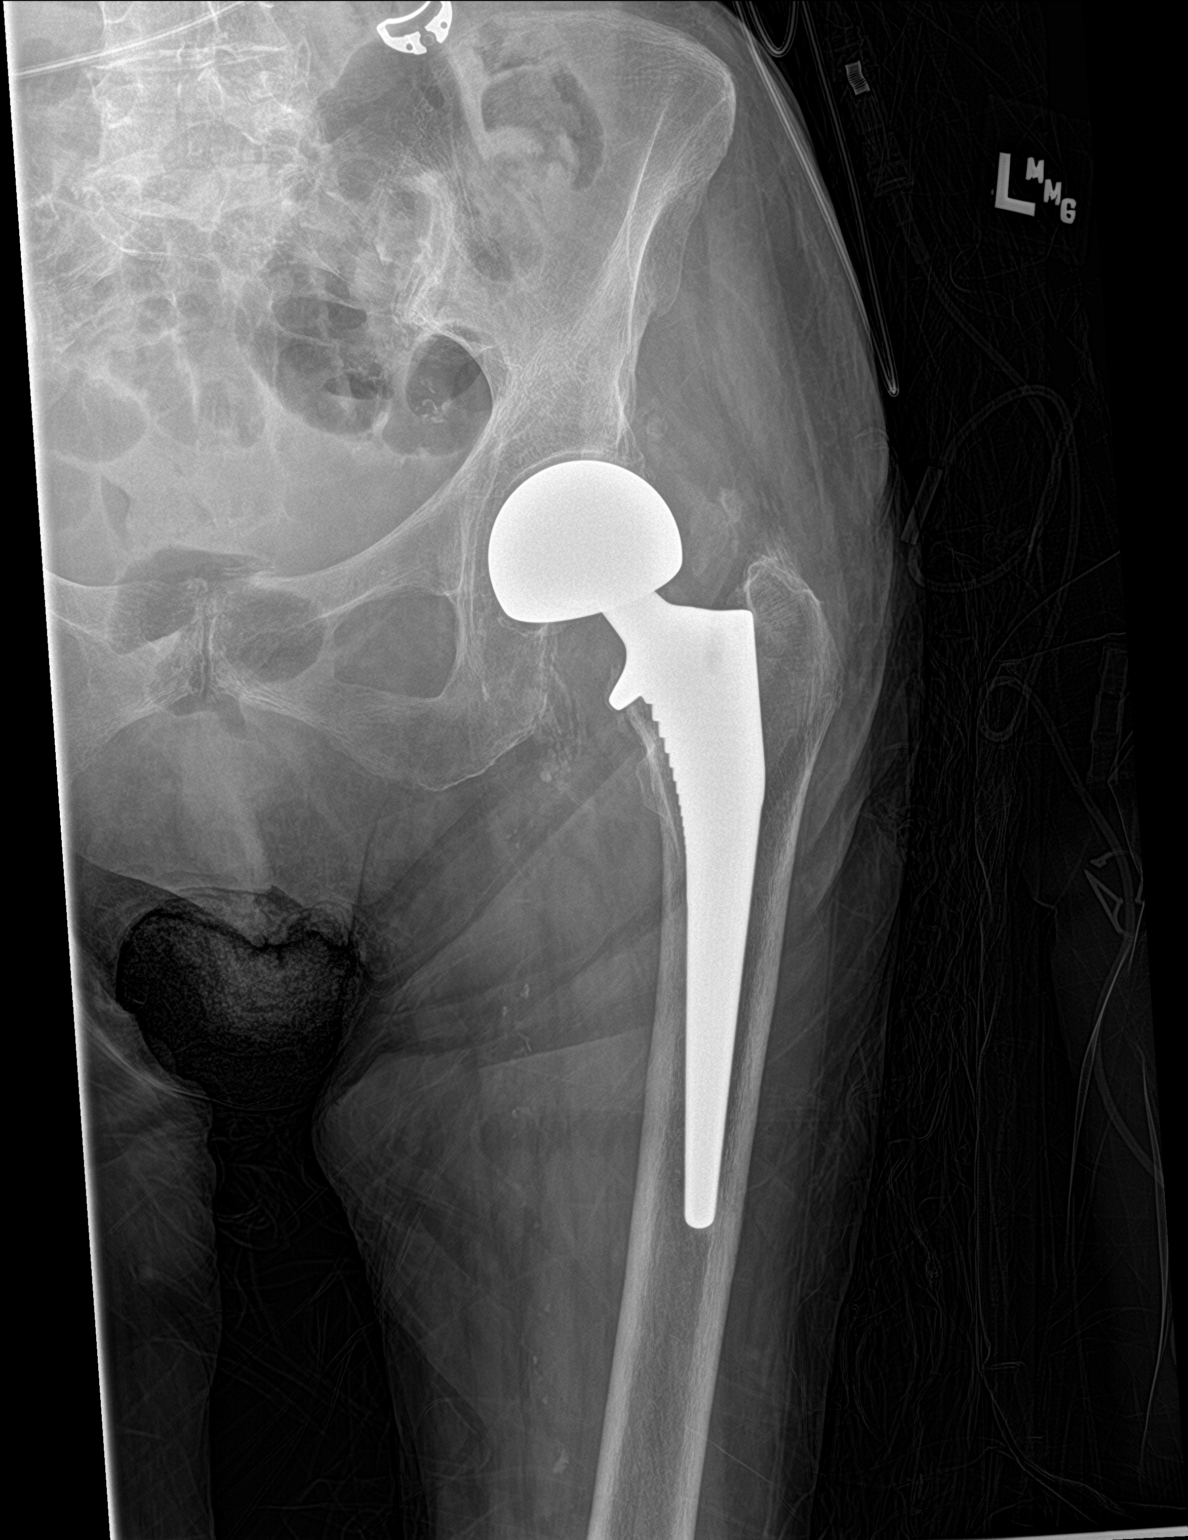

[hip lat]
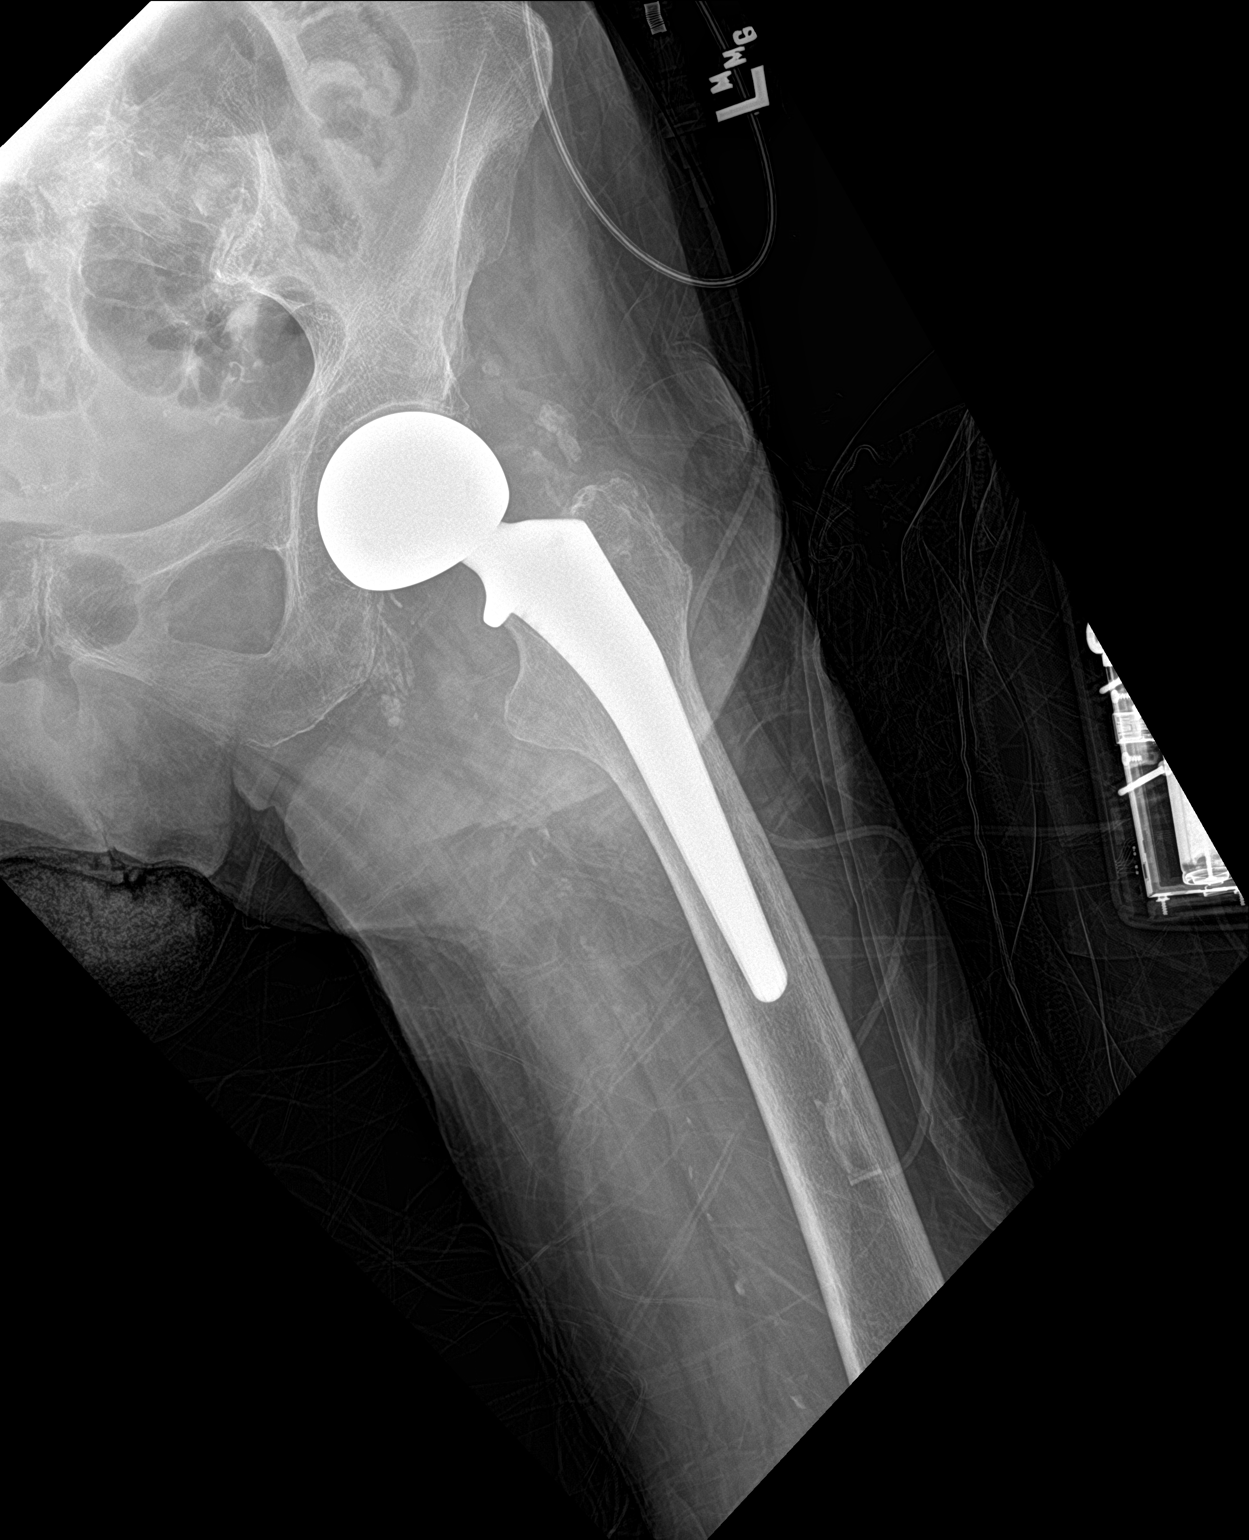

[pelvis ap]
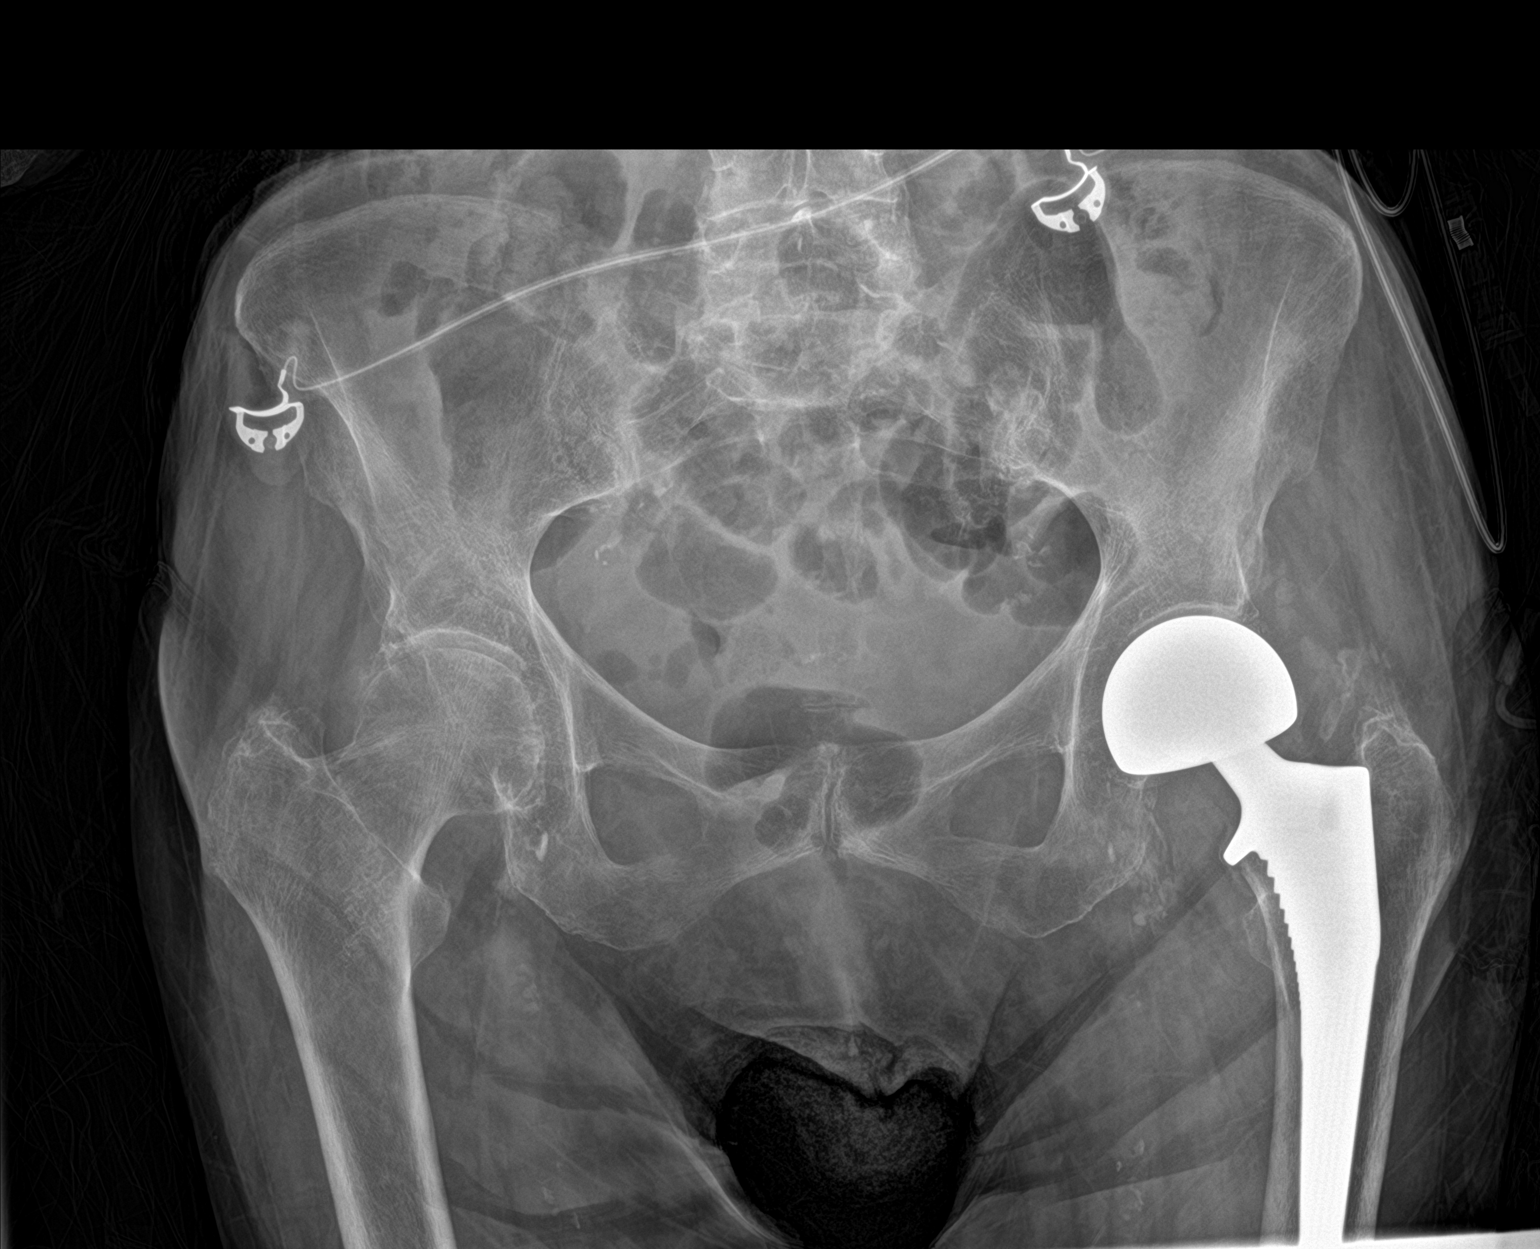

[3 of 3 positions shown; findings below may reference images not displayed]

FINDINGS: There is a left hip hemiarthroplasty. The orthopedic hardware
appears intact. No evidence of hardware loosening. There is no acute
fracture or dislocation. The bones are osteopenic. There is
osteoarthritic changes of the right hip. The soft tissues appear
unremarkable.
IMPRESSION: 1. No acute fracture or dislocation.
2. Left hip hemiarthroplasty without complication.

## 2018-12-08 DIAGNOSIS — I4819 Other persistent atrial fibrillation: Secondary | ICD-10-CM | POA: Diagnosis not present

## 2018-12-08 DIAGNOSIS — I130 Hypertensive heart and chronic kidney disease with heart failure and stage 1 through stage 4 chronic kidney disease, or unspecified chronic kidney disease: Secondary | ICD-10-CM | POA: Diagnosis not present

## 2018-12-08 DIAGNOSIS — J9690 Respiratory failure, unspecified, unspecified whether with hypoxia or hypercapnia: Secondary | ICD-10-CM | POA: Diagnosis not present

## 2018-12-08 DIAGNOSIS — I5042 Chronic combined systolic (congestive) and diastolic (congestive) heart failure: Secondary | ICD-10-CM | POA: Diagnosis not present

## 2018-12-08 DIAGNOSIS — D631 Anemia in chronic kidney disease: Secondary | ICD-10-CM | POA: Diagnosis not present

## 2018-12-08 DIAGNOSIS — N184 Chronic kidney disease, stage 4 (severe): Secondary | ICD-10-CM | POA: Diagnosis not present

## 2018-12-09 ENCOUNTER — Other Ambulatory Visit (HOSPITAL_COMMUNITY): Payer: Self-pay | Admitting: Internal Medicine

## 2018-12-10 DIAGNOSIS — I130 Hypertensive heart and chronic kidney disease with heart failure and stage 1 through stage 4 chronic kidney disease, or unspecified chronic kidney disease: Secondary | ICD-10-CM | POA: Diagnosis not present

## 2018-12-10 DIAGNOSIS — D631 Anemia in chronic kidney disease: Secondary | ICD-10-CM | POA: Diagnosis not present

## 2018-12-10 DIAGNOSIS — I5042 Chronic combined systolic (congestive) and diastolic (congestive) heart failure: Secondary | ICD-10-CM | POA: Diagnosis not present

## 2018-12-10 DIAGNOSIS — J9690 Respiratory failure, unspecified, unspecified whether with hypoxia or hypercapnia: Secondary | ICD-10-CM | POA: Diagnosis not present

## 2018-12-10 DIAGNOSIS — N184 Chronic kidney disease, stage 4 (severe): Secondary | ICD-10-CM | POA: Diagnosis not present

## 2018-12-10 DIAGNOSIS — I4819 Other persistent atrial fibrillation: Secondary | ICD-10-CM | POA: Diagnosis not present

## 2018-12-13 DIAGNOSIS — Z7901 Long term (current) use of anticoagulants: Secondary | ICD-10-CM | POA: Diagnosis not present

## 2018-12-13 DIAGNOSIS — E43 Unspecified severe protein-calorie malnutrition: Secondary | ICD-10-CM | POA: Diagnosis not present

## 2018-12-13 DIAGNOSIS — N184 Chronic kidney disease, stage 4 (severe): Secondary | ICD-10-CM | POA: Diagnosis not present

## 2018-12-13 DIAGNOSIS — Z9181 History of falling: Secondary | ICD-10-CM | POA: Diagnosis not present

## 2018-12-13 DIAGNOSIS — I739 Peripheral vascular disease, unspecified: Secondary | ICD-10-CM | POA: Diagnosis not present

## 2018-12-13 DIAGNOSIS — I130 Hypertensive heart and chronic kidney disease with heart failure and stage 1 through stage 4 chronic kidney disease, or unspecified chronic kidney disease: Secondary | ICD-10-CM | POA: Diagnosis not present

## 2018-12-13 DIAGNOSIS — F0390 Unspecified dementia without behavioral disturbance: Secondary | ICD-10-CM | POA: Diagnosis not present

## 2018-12-13 DIAGNOSIS — I2510 Atherosclerotic heart disease of native coronary artery without angina pectoris: Secondary | ICD-10-CM | POA: Diagnosis not present

## 2018-12-13 DIAGNOSIS — I5042 Chronic combined systolic (congestive) and diastolic (congestive) heart failure: Secondary | ICD-10-CM | POA: Diagnosis not present

## 2018-12-13 DIAGNOSIS — I420 Dilated cardiomyopathy: Secondary | ICD-10-CM | POA: Diagnosis not present

## 2018-12-13 DIAGNOSIS — I4819 Other persistent atrial fibrillation: Secondary | ICD-10-CM | POA: Diagnosis not present

## 2018-12-13 DIAGNOSIS — I252 Old myocardial infarction: Secondary | ICD-10-CM | POA: Diagnosis not present

## 2018-12-13 DIAGNOSIS — Z96642 Presence of left artificial hip joint: Secondary | ICD-10-CM | POA: Diagnosis not present

## 2018-12-13 DIAGNOSIS — Z452 Encounter for adjustment and management of vascular access device: Secondary | ICD-10-CM | POA: Diagnosis not present

## 2018-12-13 DIAGNOSIS — J9690 Respiratory failure, unspecified, unspecified whether with hypoxia or hypercapnia: Secondary | ICD-10-CM | POA: Diagnosis not present

## 2018-12-13 DIAGNOSIS — F419 Anxiety disorder, unspecified: Secondary | ICD-10-CM | POA: Diagnosis not present

## 2018-12-13 DIAGNOSIS — E782 Mixed hyperlipidemia: Secondary | ICD-10-CM | POA: Diagnosis not present

## 2018-12-13 DIAGNOSIS — D631 Anemia in chronic kidney disease: Secondary | ICD-10-CM | POA: Diagnosis not present

## 2018-12-17 DIAGNOSIS — I130 Hypertensive heart and chronic kidney disease with heart failure and stage 1 through stage 4 chronic kidney disease, or unspecified chronic kidney disease: Secondary | ICD-10-CM | POA: Diagnosis not present

## 2018-12-17 DIAGNOSIS — J9690 Respiratory failure, unspecified, unspecified whether with hypoxia or hypercapnia: Secondary | ICD-10-CM | POA: Diagnosis not present

## 2018-12-17 DIAGNOSIS — N184 Chronic kidney disease, stage 4 (severe): Secondary | ICD-10-CM | POA: Diagnosis not present

## 2018-12-17 DIAGNOSIS — I5042 Chronic combined systolic (congestive) and diastolic (congestive) heart failure: Secondary | ICD-10-CM | POA: Diagnosis not present

## 2018-12-17 DIAGNOSIS — I4819 Other persistent atrial fibrillation: Secondary | ICD-10-CM | POA: Diagnosis not present

## 2018-12-17 DIAGNOSIS — D631 Anemia in chronic kidney disease: Secondary | ICD-10-CM | POA: Diagnosis not present

## 2018-12-18 ENCOUNTER — Ambulatory Visit (HOSPITAL_COMMUNITY)
Admission: RE | Admit: 2018-12-18 | Discharge: 2018-12-18 | Disposition: A | Discharge status: Home / Self Care | Payer: Medicare Other | Source: Ambulatory Visit | Attending: Internal Medicine | Admitting: Internal Medicine

## 2018-12-18 ENCOUNTER — Other Ambulatory Visit (HOSPITAL_COMMUNITY): Payer: Self-pay | Admitting: Internal Medicine

## 2018-12-18 ENCOUNTER — Encounter (HOSPITAL_COMMUNITY): Payer: Self-pay | Admitting: Internal Medicine

## 2018-12-18 VITALS — BP 138/78 | HR 81 | Wt 111.8 lb

## 2018-12-18 DIAGNOSIS — Z79899 Other long term (current) drug therapy: Secondary | ICD-10-CM | POA: Insufficient documentation

## 2018-12-18 DIAGNOSIS — E785 Hyperlipidemia, unspecified: Secondary | ICD-10-CM | POA: Diagnosis not present

## 2018-12-18 DIAGNOSIS — Z8249 Family history of ischemic heart disease and other diseases of the circulatory system: Secondary | ICD-10-CM | POA: Insufficient documentation

## 2018-12-18 DIAGNOSIS — I5022 Chronic systolic (congestive) heart failure: Secondary | ICD-10-CM | POA: Insufficient documentation

## 2018-12-18 DIAGNOSIS — Z7901 Long term (current) use of anticoagulants: Secondary | ICD-10-CM | POA: Diagnosis not present

## 2018-12-18 DIAGNOSIS — F0390 Unspecified dementia without behavioral disturbance: Secondary | ICD-10-CM | POA: Diagnosis not present

## 2018-12-18 DIAGNOSIS — I2510 Atherosclerotic heart disease of native coronary artery without angina pectoris: Secondary | ICD-10-CM | POA: Insufficient documentation

## 2018-12-18 DIAGNOSIS — I428 Other cardiomyopathies: Secondary | ICD-10-CM | POA: Diagnosis not present

## 2018-12-18 DIAGNOSIS — I714 Abdominal aortic aneurysm, without rupture: Secondary | ICD-10-CM | POA: Insufficient documentation

## 2018-12-18 DIAGNOSIS — I480 Paroxysmal atrial fibrillation: Secondary | ICD-10-CM

## 2018-12-18 DIAGNOSIS — I2583 Coronary atherosclerosis due to lipid rich plaque: Secondary | ICD-10-CM | POA: Diagnosis not present

## 2018-12-18 DIAGNOSIS — I110 Hypertensive heart disease with heart failure: Secondary | ICD-10-CM | POA: Insufficient documentation

## 2018-12-18 DIAGNOSIS — I350 Nonrheumatic aortic (valve) stenosis: Secondary | ICD-10-CM | POA: Diagnosis not present

## 2018-12-18 DIAGNOSIS — I739 Peripheral vascular disease, unspecified: Secondary | ICD-10-CM | POA: Insufficient documentation

## 2018-12-18 NOTE — Patient Instructions (Signed)
No medication changes today!  Do the following things EVERYDAY: 1) Weigh yourself in the morning before breakfast. Write it down and keep it in a log. 2) Take your medicines as prescribed 3) Eat low salt foods-Limit salt (sodium) to 2000 mg per day.  4) Stay as active as you can everyday 5) Limit all fluids for the day to less than 2 liters   Your physician recommends that you schedule a follow-up appointment in: 6 months with Dr Haroldine Laws

## 2018-12-18 NOTE — Progress Notes (Signed)
Advanced Heart Failure Clinic Note    Patient ID: Jacqueline Reilly, female   DOB: 11/25/1923, 85 y.o.   MRN: [id number] PCP: [name] CHF:   HPI: Jacqueline Reilly is a 85 y.o. female with CAD, AAA, systolic HF, mild dementia, afib and CAD. She has had two previous coronary interventions including a cutting balloon to her D2 in 2007 and a DES to her mid LCX in 2010. Her LV dysfunction has been out of proportion to her CAD.  Last echo in 8/18 showed EF 25-30% (on milrinone) with severe LV dilation.  Admitted 7/11-7/24/15 with syncope and dyspnea. She had new onset A-fib/RVR. Troponin was 1.03> 1.59> 2.18 . She was placed on amiodarone and heparin drip. She developed respiratory distress and required intubation. She converted to NSR but later was bradycardiac with NSVT. Amiodarone temporarily stopped but restarted. Co-ox 29% and started on milrinone which we tried to wean off but she did not tolerate. Discharge weight 87 lbs.   Admitted in November 2016 for Gram-negative bacteremia ([place]) - enterobacter asburiae from PICC. Treated with ceftriaxone and then switched to oral levofloxacin for a total of 21 weeks and PICC replaced.   Had hip fracture and underwent repair in 5/18.  Admitted in 8/18 with bacteremia. Blood cultures + for enterobacter and pan-sensitive ecoli species.Treated with vancomycin and Zosyn, patient was later changed to rocephin.   Here for f/u: Remains on milrinone. Feels good. Able to do all ADLs. Denies SOB, orthopnea, PND or edema. No problem with PICC line. Remains confused.    Echo 8/16  EF 25% with diffuse hypokinesis, mild AS/mild AI, RV mildly dilated with moderately decreased systolic function.  Echo 2/18 EF 25-30% Echo 8/18 EF 25-30%  Review of systems complete and found to be negative unless listed in HPI.    SH:  Social History   Socioeconomic History  . Marital status: Married    Spouse name: Not on file  . Number of children: 1  . Years of  education: Not on file  . Highest education level: Not on file  Occupational History  . Occupation: retired  Scientific laboratory technician  . Financial resource strain: Not on file  . Food insecurity:    Worry: Not on file    Inability: Not on file  . Transportation needs:    Medical: Not on file    Non-medical: Not on file  Tobacco Use  . Smoking status: Never Smoker  . Smokeless tobacco: Never Used  Substance and Sexual Activity  . Alcohol use: Yes    Comment: occasionally  . Drug use: No  . Sexual activity: Not Currently  Lifestyle  . Physical activity:    Days per week: Not on file    Minutes per session: Not on file  . Stress: Not on file  Relationships  . Social connections:    Talks on phone: Not on file    Gets together: Not on file    Attends religious service: Not on file    Active member of club or organization: Not on file    Attends meetings of clubs or organizations: Not on file    Relationship status: Not on file  . Intimate partner violence:    Fear of current or ex partner: Not on file    Emotionally abused: Not on file    Physically abused: Not on file    Forced sexual activity: Not on file  Other Topics Concern  . Not on file  Social History Narrative  She lives in [place], family helps with her care.    FH:  Family History  Problem Relation Age of Onset  . Heart disease Mother   . Heart disease Father   . Heart disease Brother        x 3    Past Medical History:  Diagnosis Date  . AAA (abdominal aortic aneurysm) ([place])   . Atrial fibrillation ([place])   . Chronic systolic heart failure ([place])    a. ECHO (04/2014) EF 20-25%, diff HK, mild MR  . Coronary artery disease 2007   moderate ASCAD of the left system s/p PCI of the D2 and PCI of the left circ 03/2009  . Hyperlipidemia   . Hypertension   . PVD (peripheral vascular disease) ([place])   . Renal artery stenosis ([place])   . Ventricular dysfunction    left; ischemic    Current Outpatient Medications    Medication Sig Dispense Refill  . acetaminophen (TYLENOL) 500 MG tablet Take 1,000 mg by mouth 3 (three) times daily.    Marland Kitchen apixaban (ELIQUIS) 2.5 MG TABS tablet Take 1 tablet (2.5 mg total) by mouth 2 (two) times daily. 180 tablet 3  . busPIRone (BUSPAR) 5 MG tablet Take 5 mg by mouth daily.     . cefUROXime (CEFTIN) 500 MG tablet Take 1 tablet (500 mg total) by mouth daily. 8 tablet 0  . lisinopril (PRINIVIL,ZESTRIL) 2.5 MG tablet Take 2.5 mg by mouth daily.  1  . milrinone (PRIMACOR) 20 MG/100 ML SOLN infusion Inject 0.125 mcg/kg/min into the vein continuous. Rate 1.6 ml/hr Weight= 43 kg    . Multiple Vitamin (MULTIVITAMIN) tablet Take 1 tablet by mouth daily. 30 tablet 1  . OLANZapine (ZYPREXA) 2.5 MG tablet     . Omega-3 Fatty Acids (FISH OIL) 1000 MG CAPS Take 1 capsule by mouth 2 (two) times daily.     Marland Kitchen PACERONE 200 MG tablet 1/2 TABLET ONCE A DAY ORALLY 90  1  . torsemide (DEMADEX) 20 MG tablet Take 2 tablets (40 mg total) by mouth 2 (two) times daily. 60 tablet 0   No current facility-administered medications for this encounter.     Vitals:   12/18/18 1338  BP: 138/78  Pulse: 81  SpO2: 90%  Weight: 50.7 kg (111 lb 12.8 oz)    Wt Readings from Last 3 Encounters:  12/18/18 50.7 kg (111 lb 12.8 oz)  08/17/18 48.4 kg (106 lb 12.8 oz)  01/10/18 44 kg (97 lb)   PHYSICAL EXAM: General:  Elderly. Frail appearing. No resp difficulty HEENT: normal Neck: supple. no JVD. Carotids 2+ bilat; no bruits. No lymphadenopathy or thryomegaly appreciated. Cor: PMI nondisplaced. Regular rate & rhythm. 2/6 SEM Lungs: clear Abdomen: soft, nontender, nondistended. No hepatosplenomegaly. No bruits or masses. Good bowel sounds. Extremities: no cyanosis, clubbing, rash, edema LUE PICC Neuro: alert. Confused , cranial nerves grossly intact. moves all 4 extremities w/o difficulty. Affect pleasant  ASSESSMENT & PLAN:   1) Chronic Systolic Heart Failure: Mixed ischemic/nonischemic cardiomyopathy  (degree of LV dysfunction is out of proportion to CAD), EF 25% with moderate RV systolic dysfunction .  Echo  8/18 EF 25-30% Mild AS - Previously longstanding NYHA IV. Now on milrinone with stable NYHA II-III symptoms - Volume status looks good. - Continue torsemide 40 mg BID - Continue lisinopril 2.5 mg daily  - Continue home milrinone 0.125 mcg/kg/min for palliative purposes. Has failed weans in the past.  - Continue HH aide and [facility] support. Recent labs ok.  2) Atrial fibrillation:  - Paroxysmal.   - NSR on exam today - Denies bleeding on apixaban 2.5 mg BID. (weight less than 60 kg and age >38).  - Continue amiodarone 100 mg daily.   3) Limited Code: No CPR or defibrillation 4) CAD:  - No s/s of ischemia.    - Off ASA with Eliquis. Intolerant of statins in past.  5) Dementia; - Stable.  6) Aortic stenosis - Mild by echo 8/18. Follow.     Glori Bickers, MD  12/18/2018

## 2018-12-24 DIAGNOSIS — J9690 Respiratory failure, unspecified, unspecified whether with hypoxia or hypercapnia: Secondary | ICD-10-CM | POA: Diagnosis not present

## 2018-12-24 DIAGNOSIS — I130 Hypertensive heart and chronic kidney disease with heart failure and stage 1 through stage 4 chronic kidney disease, or unspecified chronic kidney disease: Secondary | ICD-10-CM | POA: Diagnosis not present

## 2018-12-24 DIAGNOSIS — I5042 Chronic combined systolic (congestive) and diastolic (congestive) heart failure: Secondary | ICD-10-CM | POA: Diagnosis not present

## 2018-12-24 DIAGNOSIS — D631 Anemia in chronic kidney disease: Secondary | ICD-10-CM | POA: Diagnosis not present

## 2018-12-24 DIAGNOSIS — I4819 Other persistent atrial fibrillation: Secondary | ICD-10-CM | POA: Diagnosis not present

## 2018-12-24 DIAGNOSIS — N184 Chronic kidney disease, stage 4 (severe): Secondary | ICD-10-CM | POA: Diagnosis not present

## 2018-12-25 ENCOUNTER — Other Ambulatory Visit (HOSPITAL_COMMUNITY): Payer: Self-pay | Admitting: Internal Medicine

## 2018-12-29 ENCOUNTER — Encounter: Payer: Self-pay | Admitting: Podiatry

## 2018-12-29 ENCOUNTER — Ambulatory Visit (INDEPENDENT_AMBULATORY_CARE_PROVIDER_SITE_OTHER): Payer: Medicare Other | Admitting: Podiatry

## 2018-12-29 DIAGNOSIS — M79676 Pain in unspecified toe(s): Secondary | ICD-10-CM

## 2018-12-29 DIAGNOSIS — D689 Coagulation defect, unspecified: Secondary | ICD-10-CM

## 2018-12-29 DIAGNOSIS — B351 Tinea unguium: Secondary | ICD-10-CM

## 2018-12-29 DIAGNOSIS — L84 Corns and callosities: Secondary | ICD-10-CM

## 2018-12-29 NOTE — Progress Notes (Signed)
Complaint:  Visit Type: Patient returns to my office for continued preventative foot care services. Complaint: Patient states" my nails have grown long and thick and become painful to walk and wear shoes" Patient has been diagnosed with DM with no foot complications. The patient presents for preventative foot care services. No changes to ROS.  Patient is taking eliquiss.  Podiatric Exam: Vascular: dorsalis pedis and posterior tibial pulses are weakly  palpable bilateral. Capillary return is immediate. Temperature gradient is WNL. Skin turgor WNL  Sensorium: Normal Semmes Weinstein monofilament test. Normal tactile sensation bilaterally. Nail Exam: Pt has thick disfigured discolored nails with subungual debris noted bilateral entire nail hallux through fifth toenails Ulcer Exam: There is no evidence of ulcer or pre-ulcerative changes or infection. Orthopedic Exam: Muscle tone and strength are WNL. No limitations in general ROM. No crepitus or effusions noted. Foot type and digits show no abnormalities. HAV  B/L and tailors bunion  B/l. Skin: No Porokeratosis. No infection or ulcers  Diagnosis:  Onychomycosis, , Pain in right toe, pain in left toes  Treatment & Plan Procedures and Treatment: Consent by patient was obtained for treatment procedures.   Debridement of mycotic and hypertrophic toenails, 1 through 5 bilateral and clearing of subungual debris. No ulceration, no infection noted.  Return Visit-Office Procedure: Patient instructed to return to the office for a follow up visit 3 months for continued evaluation and treatment.    Gardiner Barefoot DPM

## 2018-12-31 DIAGNOSIS — I130 Hypertensive heart and chronic kidney disease with heart failure and stage 1 through stage 4 chronic kidney disease, or unspecified chronic kidney disease: Secondary | ICD-10-CM | POA: Diagnosis not present

## 2018-12-31 DIAGNOSIS — I4819 Other persistent atrial fibrillation: Secondary | ICD-10-CM | POA: Diagnosis not present

## 2018-12-31 DIAGNOSIS — J9690 Respiratory failure, unspecified, unspecified whether with hypoxia or hypercapnia: Secondary | ICD-10-CM | POA: Diagnosis not present

## 2018-12-31 DIAGNOSIS — I5042 Chronic combined systolic (congestive) and diastolic (congestive) heart failure: Secondary | ICD-10-CM | POA: Diagnosis not present

## 2018-12-31 DIAGNOSIS — D631 Anemia in chronic kidney disease: Secondary | ICD-10-CM | POA: Diagnosis not present

## 2018-12-31 DIAGNOSIS — N184 Chronic kidney disease, stage 4 (severe): Secondary | ICD-10-CM | POA: Diagnosis not present

## 2019-01-01 ENCOUNTER — Other Ambulatory Visit (HOSPITAL_COMMUNITY): Payer: Self-pay | Admitting: Internal Medicine

## 2019-01-05 DIAGNOSIS — I5042 Chronic combined systolic (congestive) and diastolic (congestive) heart failure: Secondary | ICD-10-CM | POA: Diagnosis not present

## 2019-01-05 DIAGNOSIS — N184 Chronic kidney disease, stage 4 (severe): Secondary | ICD-10-CM | POA: Diagnosis not present

## 2019-01-05 DIAGNOSIS — J9690 Respiratory failure, unspecified, unspecified whether with hypoxia or hypercapnia: Secondary | ICD-10-CM | POA: Diagnosis not present

## 2019-01-05 DIAGNOSIS — I4819 Other persistent atrial fibrillation: Secondary | ICD-10-CM | POA: Diagnosis not present

## 2019-01-05 DIAGNOSIS — D631 Anemia in chronic kidney disease: Secondary | ICD-10-CM | POA: Diagnosis not present

## 2019-01-05 DIAGNOSIS — I130 Hypertensive heart and chronic kidney disease with heart failure and stage 1 through stage 4 chronic kidney disease, or unspecified chronic kidney disease: Secondary | ICD-10-CM | POA: Diagnosis not present

## 2019-01-07 DIAGNOSIS — N184 Chronic kidney disease, stage 4 (severe): Secondary | ICD-10-CM | POA: Diagnosis not present

## 2019-01-07 DIAGNOSIS — I5042 Chronic combined systolic (congestive) and diastolic (congestive) heart failure: Secondary | ICD-10-CM | POA: Diagnosis not present

## 2019-01-07 DIAGNOSIS — D631 Anemia in chronic kidney disease: Secondary | ICD-10-CM | POA: Diagnosis not present

## 2019-01-07 DIAGNOSIS — I130 Hypertensive heart and chronic kidney disease with heart failure and stage 1 through stage 4 chronic kidney disease, or unspecified chronic kidney disease: Secondary | ICD-10-CM | POA: Diagnosis not present

## 2019-01-07 DIAGNOSIS — J9690 Respiratory failure, unspecified, unspecified whether with hypoxia or hypercapnia: Secondary | ICD-10-CM | POA: Diagnosis not present

## 2019-01-07 DIAGNOSIS — I4819 Other persistent atrial fibrillation: Secondary | ICD-10-CM | POA: Diagnosis not present

## 2019-01-08 ENCOUNTER — Telehealth (HOSPITAL_COMMUNITY): Payer: Self-pay | Admitting: *Deleted

## 2019-01-08 NOTE — Telephone Encounter (Signed)
[facility] called to report pt weight gain of 4lbs x 1 week. Per Caryl Pina increase Torsemide to 60mg  twice daily for 2 days. [facility] aware and agreeable with plan.

## 2019-01-11 ENCOUNTER — Other Ambulatory Visit (HOSPITAL_COMMUNITY): Payer: Self-pay | Admitting: *Deleted

## 2019-01-11 MED ORDER — PACERONE 200 MG PO TABS: ORAL_TABLET | ORAL | 1 refills | Status: DC

## 2019-01-12 DIAGNOSIS — I2510 Atherosclerotic heart disease of native coronary artery without angina pectoris: Secondary | ICD-10-CM | POA: Diagnosis not present

## 2019-01-12 DIAGNOSIS — N184 Chronic kidney disease, stage 4 (severe): Secondary | ICD-10-CM | POA: Diagnosis not present

## 2019-01-12 DIAGNOSIS — I130 Hypertensive heart and chronic kidney disease with heart failure and stage 1 through stage 4 chronic kidney disease, or unspecified chronic kidney disease: Secondary | ICD-10-CM | POA: Diagnosis not present

## 2019-01-12 DIAGNOSIS — Z7901 Long term (current) use of anticoagulants: Secondary | ICD-10-CM | POA: Diagnosis not present

## 2019-01-12 DIAGNOSIS — E43 Unspecified severe protein-calorie malnutrition: Secondary | ICD-10-CM | POA: Diagnosis not present

## 2019-01-12 DIAGNOSIS — I4819 Other persistent atrial fibrillation: Secondary | ICD-10-CM | POA: Diagnosis not present

## 2019-01-12 DIAGNOSIS — J9690 Respiratory failure, unspecified, unspecified whether with hypoxia or hypercapnia: Secondary | ICD-10-CM | POA: Diagnosis not present

## 2019-01-12 DIAGNOSIS — E782 Mixed hyperlipidemia: Secondary | ICD-10-CM | POA: Diagnosis not present

## 2019-01-12 DIAGNOSIS — I5042 Chronic combined systolic (congestive) and diastolic (congestive) heart failure: Secondary | ICD-10-CM | POA: Diagnosis not present

## 2019-01-12 DIAGNOSIS — F0390 Unspecified dementia without behavioral disturbance: Secondary | ICD-10-CM | POA: Diagnosis not present

## 2019-01-12 DIAGNOSIS — F419 Anxiety disorder, unspecified: Secondary | ICD-10-CM | POA: Diagnosis not present

## 2019-01-12 DIAGNOSIS — Z452 Encounter for adjustment and management of vascular access device: Secondary | ICD-10-CM | POA: Diagnosis not present

## 2019-01-12 DIAGNOSIS — I252 Old myocardial infarction: Secondary | ICD-10-CM | POA: Diagnosis not present

## 2019-01-12 DIAGNOSIS — I739 Peripheral vascular disease, unspecified: Secondary | ICD-10-CM | POA: Diagnosis not present

## 2019-01-12 DIAGNOSIS — Z9181 History of falling: Secondary | ICD-10-CM | POA: Diagnosis not present

## 2019-01-12 DIAGNOSIS — I420 Dilated cardiomyopathy: Secondary | ICD-10-CM | POA: Diagnosis not present

## 2019-01-12 DIAGNOSIS — D631 Anemia in chronic kidney disease: Secondary | ICD-10-CM | POA: Diagnosis not present

## 2019-01-12 DIAGNOSIS — Z96642 Presence of left artificial hip joint: Secondary | ICD-10-CM | POA: Diagnosis not present

## 2019-01-14 DIAGNOSIS — N184 Chronic kidney disease, stage 4 (severe): Secondary | ICD-10-CM | POA: Diagnosis not present

## 2019-01-14 DIAGNOSIS — I5042 Chronic combined systolic (congestive) and diastolic (congestive) heart failure: Secondary | ICD-10-CM | POA: Diagnosis not present

## 2019-01-14 DIAGNOSIS — D631 Anemia in chronic kidney disease: Secondary | ICD-10-CM | POA: Diagnosis not present

## 2019-01-14 DIAGNOSIS — I4819 Other persistent atrial fibrillation: Secondary | ICD-10-CM | POA: Diagnosis not present

## 2019-01-14 DIAGNOSIS — I130 Hypertensive heart and chronic kidney disease with heart failure and stage 1 through stage 4 chronic kidney disease, or unspecified chronic kidney disease: Secondary | ICD-10-CM | POA: Diagnosis not present

## 2019-01-14 DIAGNOSIS — J9690 Respiratory failure, unspecified, unspecified whether with hypoxia or hypercapnia: Secondary | ICD-10-CM | POA: Diagnosis not present

## 2019-01-21 DIAGNOSIS — I130 Hypertensive heart and chronic kidney disease with heart failure and stage 1 through stage 4 chronic kidney disease, or unspecified chronic kidney disease: Secondary | ICD-10-CM | POA: Diagnosis not present

## 2019-01-21 DIAGNOSIS — I4819 Other persistent atrial fibrillation: Secondary | ICD-10-CM | POA: Diagnosis not present

## 2019-01-21 DIAGNOSIS — J9690 Respiratory failure, unspecified, unspecified whether with hypoxia or hypercapnia: Secondary | ICD-10-CM | POA: Diagnosis not present

## 2019-01-21 DIAGNOSIS — N184 Chronic kidney disease, stage 4 (severe): Secondary | ICD-10-CM | POA: Diagnosis not present

## 2019-01-21 DIAGNOSIS — I5042 Chronic combined systolic (congestive) and diastolic (congestive) heart failure: Secondary | ICD-10-CM | POA: Diagnosis not present

## 2019-01-21 DIAGNOSIS — D631 Anemia in chronic kidney disease: Secondary | ICD-10-CM | POA: Diagnosis not present

## 2019-01-26 DIAGNOSIS — D631 Anemia in chronic kidney disease: Secondary | ICD-10-CM | POA: Diagnosis not present

## 2019-01-26 DIAGNOSIS — I4819 Other persistent atrial fibrillation: Secondary | ICD-10-CM | POA: Diagnosis not present

## 2019-01-26 DIAGNOSIS — N184 Chronic kidney disease, stage 4 (severe): Secondary | ICD-10-CM | POA: Diagnosis not present

## 2019-01-26 DIAGNOSIS — J9690 Respiratory failure, unspecified, unspecified whether with hypoxia or hypercapnia: Secondary | ICD-10-CM | POA: Diagnosis not present

## 2019-01-26 DIAGNOSIS — I5042 Chronic combined systolic (congestive) and diastolic (congestive) heart failure: Secondary | ICD-10-CM | POA: Diagnosis not present

## 2019-01-26 DIAGNOSIS — I130 Hypertensive heart and chronic kidney disease with heart failure and stage 1 through stage 4 chronic kidney disease, or unspecified chronic kidney disease: Secondary | ICD-10-CM | POA: Diagnosis not present

## 2019-01-28 DIAGNOSIS — I130 Hypertensive heart and chronic kidney disease with heart failure and stage 1 through stage 4 chronic kidney disease, or unspecified chronic kidney disease: Secondary | ICD-10-CM | POA: Diagnosis not present

## 2019-01-28 DIAGNOSIS — D631 Anemia in chronic kidney disease: Secondary | ICD-10-CM | POA: Diagnosis not present

## 2019-01-28 DIAGNOSIS — Z79899 Other long term (current) drug therapy: Secondary | ICD-10-CM | POA: Diagnosis not present

## 2019-01-28 DIAGNOSIS — J9690 Respiratory failure, unspecified, unspecified whether with hypoxia or hypercapnia: Secondary | ICD-10-CM | POA: Diagnosis not present

## 2019-01-28 DIAGNOSIS — I4819 Other persistent atrial fibrillation: Secondary | ICD-10-CM | POA: Diagnosis not present

## 2019-01-28 DIAGNOSIS — N184 Chronic kidney disease, stage 4 (severe): Secondary | ICD-10-CM | POA: Diagnosis not present

## 2019-01-28 DIAGNOSIS — I5042 Chronic combined systolic (congestive) and diastolic (congestive) heart failure: Secondary | ICD-10-CM | POA: Diagnosis not present

## 2019-02-02 ENCOUNTER — Other Ambulatory Visit (HOSPITAL_COMMUNITY): Payer: Self-pay | Admitting: Internal Medicine

## 2019-02-04 ENCOUNTER — Telehealth (HOSPITAL_COMMUNITY): Payer: Self-pay

## 2019-02-04 DIAGNOSIS — J9690 Respiratory failure, unspecified, unspecified whether with hypoxia or hypercapnia: Secondary | ICD-10-CM | POA: Diagnosis not present

## 2019-02-04 DIAGNOSIS — I4819 Other persistent atrial fibrillation: Secondary | ICD-10-CM | POA: Diagnosis not present

## 2019-02-04 DIAGNOSIS — N184 Chronic kidney disease, stage 4 (severe): Secondary | ICD-10-CM | POA: Diagnosis not present

## 2019-02-04 DIAGNOSIS — I130 Hypertensive heart and chronic kidney disease with heart failure and stage 1 through stage 4 chronic kidney disease, or unspecified chronic kidney disease: Secondary | ICD-10-CM | POA: Diagnosis not present

## 2019-02-04 DIAGNOSIS — I5042 Chronic combined systolic (congestive) and diastolic (congestive) heart failure: Secondary | ICD-10-CM | POA: Diagnosis not present

## 2019-02-04 DIAGNOSIS — D631 Anemia in chronic kidney disease: Secondary | ICD-10-CM | POA: Diagnosis not present

## 2019-02-04 NOTE — Telephone Encounter (Signed)
Home Health RN called to report that pt's cap is stuck on the end of her picc line. milronone still able to run.

## 2019-02-10 DIAGNOSIS — I4819 Other persistent atrial fibrillation: Secondary | ICD-10-CM | POA: Diagnosis not present

## 2019-02-10 DIAGNOSIS — I5042 Chronic combined systolic (congestive) and diastolic (congestive) heart failure: Secondary | ICD-10-CM | POA: Diagnosis not present

## 2019-02-10 DIAGNOSIS — I130 Hypertensive heart and chronic kidney disease with heart failure and stage 1 through stage 4 chronic kidney disease, or unspecified chronic kidney disease: Secondary | ICD-10-CM | POA: Diagnosis not present

## 2019-02-10 DIAGNOSIS — N184 Chronic kidney disease, stage 4 (severe): Secondary | ICD-10-CM | POA: Diagnosis not present

## 2019-02-10 DIAGNOSIS — J9690 Respiratory failure, unspecified, unspecified whether with hypoxia or hypercapnia: Secondary | ICD-10-CM | POA: Diagnosis not present

## 2019-02-10 DIAGNOSIS — D631 Anemia in chronic kidney disease: Secondary | ICD-10-CM | POA: Diagnosis not present

## 2019-02-11 DIAGNOSIS — I130 Hypertensive heart and chronic kidney disease with heart failure and stage 1 through stage 4 chronic kidney disease, or unspecified chronic kidney disease: Secondary | ICD-10-CM | POA: Diagnosis not present

## 2019-02-11 DIAGNOSIS — E782 Mixed hyperlipidemia: Secondary | ICD-10-CM | POA: Diagnosis not present

## 2019-02-11 DIAGNOSIS — I252 Old myocardial infarction: Secondary | ICD-10-CM | POA: Diagnosis not present

## 2019-02-11 DIAGNOSIS — E43 Unspecified severe protein-calorie malnutrition: Secondary | ICD-10-CM | POA: Diagnosis not present

## 2019-02-11 DIAGNOSIS — Z9181 History of falling: Secondary | ICD-10-CM | POA: Diagnosis not present

## 2019-02-11 DIAGNOSIS — F419 Anxiety disorder, unspecified: Secondary | ICD-10-CM | POA: Diagnosis not present

## 2019-02-11 DIAGNOSIS — Z96642 Presence of left artificial hip joint: Secondary | ICD-10-CM | POA: Diagnosis not present

## 2019-02-11 DIAGNOSIS — J9690 Respiratory failure, unspecified, unspecified whether with hypoxia or hypercapnia: Secondary | ICD-10-CM | POA: Diagnosis not present

## 2019-02-11 DIAGNOSIS — I2510 Atherosclerotic heart disease of native coronary artery without angina pectoris: Secondary | ICD-10-CM | POA: Diagnosis not present

## 2019-02-11 DIAGNOSIS — F0390 Unspecified dementia without behavioral disturbance: Secondary | ICD-10-CM | POA: Diagnosis not present

## 2019-02-11 DIAGNOSIS — N184 Chronic kidney disease, stage 4 (severe): Secondary | ICD-10-CM | POA: Diagnosis not present

## 2019-02-11 DIAGNOSIS — I4819 Other persistent atrial fibrillation: Secondary | ICD-10-CM | POA: Diagnosis not present

## 2019-02-11 DIAGNOSIS — I5042 Chronic combined systolic (congestive) and diastolic (congestive) heart failure: Secondary | ICD-10-CM | POA: Diagnosis not present

## 2019-02-11 DIAGNOSIS — Z7901 Long term (current) use of anticoagulants: Secondary | ICD-10-CM | POA: Diagnosis not present

## 2019-02-11 DIAGNOSIS — D631 Anemia in chronic kidney disease: Secondary | ICD-10-CM | POA: Diagnosis not present

## 2019-02-11 DIAGNOSIS — I739 Peripheral vascular disease, unspecified: Secondary | ICD-10-CM | POA: Diagnosis not present

## 2019-02-11 DIAGNOSIS — I420 Dilated cardiomyopathy: Secondary | ICD-10-CM | POA: Diagnosis not present

## 2019-02-11 DIAGNOSIS — Z452 Encounter for adjustment and management of vascular access device: Secondary | ICD-10-CM | POA: Diagnosis not present

## 2019-02-16 ENCOUNTER — Other Ambulatory Visit (HOSPITAL_COMMUNITY): Payer: Self-pay | Admitting: Internal Medicine

## 2019-02-18 DIAGNOSIS — J9690 Respiratory failure, unspecified, unspecified whether with hypoxia or hypercapnia: Secondary | ICD-10-CM | POA: Diagnosis not present

## 2019-02-18 DIAGNOSIS — D631 Anemia in chronic kidney disease: Secondary | ICD-10-CM | POA: Diagnosis not present

## 2019-02-18 DIAGNOSIS — I4819 Other persistent atrial fibrillation: Secondary | ICD-10-CM | POA: Diagnosis not present

## 2019-02-18 DIAGNOSIS — I5042 Chronic combined systolic (congestive) and diastolic (congestive) heart failure: Secondary | ICD-10-CM | POA: Diagnosis not present

## 2019-02-18 DIAGNOSIS — N184 Chronic kidney disease, stage 4 (severe): Secondary | ICD-10-CM | POA: Diagnosis not present

## 2019-02-18 DIAGNOSIS — I130 Hypertensive heart and chronic kidney disease with heart failure and stage 1 through stage 4 chronic kidney disease, or unspecified chronic kidney disease: Secondary | ICD-10-CM | POA: Diagnosis not present

## 2019-02-25 DIAGNOSIS — I5042 Chronic combined systolic (congestive) and diastolic (congestive) heart failure: Secondary | ICD-10-CM | POA: Diagnosis not present

## 2019-02-25 DIAGNOSIS — I4819 Other persistent atrial fibrillation: Secondary | ICD-10-CM | POA: Diagnosis not present

## 2019-02-25 DIAGNOSIS — D631 Anemia in chronic kidney disease: Secondary | ICD-10-CM | POA: Diagnosis not present

## 2019-02-25 DIAGNOSIS — J9690 Respiratory failure, unspecified, unspecified whether with hypoxia or hypercapnia: Secondary | ICD-10-CM | POA: Diagnosis not present

## 2019-02-25 DIAGNOSIS — N184 Chronic kidney disease, stage 4 (severe): Secondary | ICD-10-CM | POA: Diagnosis not present

## 2019-02-25 DIAGNOSIS — I130 Hypertensive heart and chronic kidney disease with heart failure and stage 1 through stage 4 chronic kidney disease, or unspecified chronic kidney disease: Secondary | ICD-10-CM | POA: Diagnosis not present

## 2019-03-01 ENCOUNTER — Other Ambulatory Visit: Payer: Self-pay | Admitting: *Deleted

## 2019-03-04 DIAGNOSIS — N184 Chronic kidney disease, stage 4 (severe): Secondary | ICD-10-CM | POA: Diagnosis not present

## 2019-03-04 DIAGNOSIS — I5042 Chronic combined systolic (congestive) and diastolic (congestive) heart failure: Secondary | ICD-10-CM | POA: Diagnosis not present

## 2019-03-04 DIAGNOSIS — I130 Hypertensive heart and chronic kidney disease with heart failure and stage 1 through stage 4 chronic kidney disease, or unspecified chronic kidney disease: Secondary | ICD-10-CM | POA: Diagnosis not present

## 2019-03-04 DIAGNOSIS — D631 Anemia in chronic kidney disease: Secondary | ICD-10-CM | POA: Diagnosis not present

## 2019-03-04 DIAGNOSIS — I4819 Other persistent atrial fibrillation: Secondary | ICD-10-CM | POA: Diagnosis not present

## 2019-03-04 DIAGNOSIS — J9690 Respiratory failure, unspecified, unspecified whether with hypoxia or hypercapnia: Secondary | ICD-10-CM | POA: Diagnosis not present

## 2019-03-08 ENCOUNTER — Other Ambulatory Visit (HOSPITAL_COMMUNITY): Payer: Self-pay | Admitting: Internal Medicine

## 2019-03-09 ENCOUNTER — Other Ambulatory Visit (HOSPITAL_COMMUNITY): Payer: Self-pay | Admitting: Internal Medicine

## 2019-03-09 DIAGNOSIS — I130 Hypertensive heart and chronic kidney disease with heart failure and stage 1 through stage 4 chronic kidney disease, or unspecified chronic kidney disease: Secondary | ICD-10-CM | POA: Diagnosis not present

## 2019-03-09 DIAGNOSIS — I4819 Other persistent atrial fibrillation: Secondary | ICD-10-CM | POA: Diagnosis not present

## 2019-03-09 DIAGNOSIS — N184 Chronic kidney disease, stage 4 (severe): Secondary | ICD-10-CM | POA: Diagnosis not present

## 2019-03-09 DIAGNOSIS — I5042 Chronic combined systolic (congestive) and diastolic (congestive) heart failure: Secondary | ICD-10-CM | POA: Diagnosis not present

## 2019-03-09 DIAGNOSIS — J9690 Respiratory failure, unspecified, unspecified whether with hypoxia or hypercapnia: Secondary | ICD-10-CM | POA: Diagnosis not present

## 2019-03-09 DIAGNOSIS — D631 Anemia in chronic kidney disease: Secondary | ICD-10-CM | POA: Diagnosis not present

## 2019-03-11 DIAGNOSIS — D631 Anemia in chronic kidney disease: Secondary | ICD-10-CM | POA: Diagnosis not present

## 2019-03-11 DIAGNOSIS — I5042 Chronic combined systolic (congestive) and diastolic (congestive) heart failure: Secondary | ICD-10-CM | POA: Diagnosis not present

## 2019-03-11 DIAGNOSIS — I130 Hypertensive heart and chronic kidney disease with heart failure and stage 1 through stage 4 chronic kidney disease, or unspecified chronic kidney disease: Secondary | ICD-10-CM | POA: Diagnosis not present

## 2019-03-11 DIAGNOSIS — Z79899 Other long term (current) drug therapy: Secondary | ICD-10-CM | POA: Diagnosis not present

## 2019-03-11 DIAGNOSIS — N184 Chronic kidney disease, stage 4 (severe): Secondary | ICD-10-CM | POA: Diagnosis not present

## 2019-03-11 DIAGNOSIS — J9690 Respiratory failure, unspecified, unspecified whether with hypoxia or hypercapnia: Secondary | ICD-10-CM | POA: Diagnosis not present

## 2019-03-11 DIAGNOSIS — I4819 Other persistent atrial fibrillation: Secondary | ICD-10-CM | POA: Diagnosis not present

## 2019-03-13 DIAGNOSIS — I130 Hypertensive heart and chronic kidney disease with heart failure and stage 1 through stage 4 chronic kidney disease, or unspecified chronic kidney disease: Secondary | ICD-10-CM | POA: Diagnosis not present

## 2019-03-13 DIAGNOSIS — E782 Mixed hyperlipidemia: Secondary | ICD-10-CM | POA: Diagnosis not present

## 2019-03-13 DIAGNOSIS — D631 Anemia in chronic kidney disease: Secondary | ICD-10-CM | POA: Diagnosis not present

## 2019-03-13 DIAGNOSIS — I739 Peripheral vascular disease, unspecified: Secondary | ICD-10-CM | POA: Diagnosis not present

## 2019-03-13 DIAGNOSIS — I252 Old myocardial infarction: Secondary | ICD-10-CM | POA: Diagnosis not present

## 2019-03-13 DIAGNOSIS — N184 Chronic kidney disease, stage 4 (severe): Secondary | ICD-10-CM | POA: Diagnosis not present

## 2019-03-13 DIAGNOSIS — I2510 Atherosclerotic heart disease of native coronary artery without angina pectoris: Secondary | ICD-10-CM | POA: Diagnosis not present

## 2019-03-13 DIAGNOSIS — J9690 Respiratory failure, unspecified, unspecified whether with hypoxia or hypercapnia: Secondary | ICD-10-CM | POA: Diagnosis not present

## 2019-03-13 DIAGNOSIS — I420 Dilated cardiomyopathy: Secondary | ICD-10-CM | POA: Diagnosis not present

## 2019-03-13 DIAGNOSIS — Z96642 Presence of left artificial hip joint: Secondary | ICD-10-CM | POA: Diagnosis not present

## 2019-03-13 DIAGNOSIS — I4819 Other persistent atrial fibrillation: Secondary | ICD-10-CM | POA: Diagnosis not present

## 2019-03-13 DIAGNOSIS — E43 Unspecified severe protein-calorie malnutrition: Secondary | ICD-10-CM | POA: Diagnosis not present

## 2019-03-13 DIAGNOSIS — F0390 Unspecified dementia without behavioral disturbance: Secondary | ICD-10-CM | POA: Diagnosis not present

## 2019-03-13 DIAGNOSIS — Z452 Encounter for adjustment and management of vascular access device: Secondary | ICD-10-CM | POA: Diagnosis not present

## 2019-03-13 DIAGNOSIS — I5042 Chronic combined systolic (congestive) and diastolic (congestive) heart failure: Secondary | ICD-10-CM | POA: Diagnosis not present

## 2019-03-13 DIAGNOSIS — F419 Anxiety disorder, unspecified: Secondary | ICD-10-CM | POA: Diagnosis not present

## 2019-03-13 DIAGNOSIS — Z7901 Long term (current) use of anticoagulants: Secondary | ICD-10-CM | POA: Diagnosis not present

## 2019-03-13 DIAGNOSIS — Z9181 History of falling: Secondary | ICD-10-CM | POA: Diagnosis not present

## 2019-03-16 DIAGNOSIS — N184 Chronic kidney disease, stage 4 (severe): Secondary | ICD-10-CM | POA: Diagnosis not present

## 2019-03-16 DIAGNOSIS — D631 Anemia in chronic kidney disease: Secondary | ICD-10-CM | POA: Diagnosis not present

## 2019-03-16 DIAGNOSIS — I4819 Other persistent atrial fibrillation: Secondary | ICD-10-CM | POA: Diagnosis not present

## 2019-03-16 DIAGNOSIS — I130 Hypertensive heart and chronic kidney disease with heart failure and stage 1 through stage 4 chronic kidney disease, or unspecified chronic kidney disease: Secondary | ICD-10-CM | POA: Diagnosis not present

## 2019-03-16 DIAGNOSIS — I5042 Chronic combined systolic (congestive) and diastolic (congestive) heart failure: Secondary | ICD-10-CM | POA: Diagnosis not present

## 2019-03-16 DIAGNOSIS — J9690 Respiratory failure, unspecified, unspecified whether with hypoxia or hypercapnia: Secondary | ICD-10-CM | POA: Diagnosis not present

## 2019-03-18 DIAGNOSIS — I5042 Chronic combined systolic (congestive) and diastolic (congestive) heart failure: Secondary | ICD-10-CM | POA: Diagnosis not present

## 2019-03-18 DIAGNOSIS — N184 Chronic kidney disease, stage 4 (severe): Secondary | ICD-10-CM | POA: Diagnosis not present

## 2019-03-18 DIAGNOSIS — I4819 Other persistent atrial fibrillation: Secondary | ICD-10-CM | POA: Diagnosis not present

## 2019-03-18 DIAGNOSIS — J9690 Respiratory failure, unspecified, unspecified whether with hypoxia or hypercapnia: Secondary | ICD-10-CM | POA: Diagnosis not present

## 2019-03-18 DIAGNOSIS — D631 Anemia in chronic kidney disease: Secondary | ICD-10-CM | POA: Diagnosis not present

## 2019-03-18 DIAGNOSIS — I130 Hypertensive heart and chronic kidney disease with heart failure and stage 1 through stage 4 chronic kidney disease, or unspecified chronic kidney disease: Secondary | ICD-10-CM | POA: Diagnosis not present

## 2019-03-25 DIAGNOSIS — N184 Chronic kidney disease, stage 4 (severe): Secondary | ICD-10-CM | POA: Diagnosis not present

## 2019-03-25 DIAGNOSIS — I5042 Chronic combined systolic (congestive) and diastolic (congestive) heart failure: Secondary | ICD-10-CM | POA: Diagnosis not present

## 2019-03-25 DIAGNOSIS — J9690 Respiratory failure, unspecified, unspecified whether with hypoxia or hypercapnia: Secondary | ICD-10-CM | POA: Diagnosis not present

## 2019-03-25 DIAGNOSIS — I130 Hypertensive heart and chronic kidney disease with heart failure and stage 1 through stage 4 chronic kidney disease, or unspecified chronic kidney disease: Secondary | ICD-10-CM | POA: Diagnosis not present

## 2019-03-25 DIAGNOSIS — I4819 Other persistent atrial fibrillation: Secondary | ICD-10-CM | POA: Diagnosis not present

## 2019-03-25 DIAGNOSIS — D631 Anemia in chronic kidney disease: Secondary | ICD-10-CM | POA: Diagnosis not present

## 2019-03-30 ENCOUNTER — Ambulatory Visit: Payer: Medicare Other | Admitting: Podiatry

## 2019-04-01 ENCOUNTER — Other Ambulatory Visit (HOSPITAL_COMMUNITY): Payer: Self-pay | Admitting: Internal Medicine

## 2019-04-01 ENCOUNTER — Other Ambulatory Visit: Payer: Self-pay | Admitting: Cardiology

## 2019-04-01 DIAGNOSIS — I5042 Chronic combined systolic (congestive) and diastolic (congestive) heart failure: Secondary | ICD-10-CM | POA: Diagnosis not present

## 2019-04-01 DIAGNOSIS — J9690 Respiratory failure, unspecified, unspecified whether with hypoxia or hypercapnia: Secondary | ICD-10-CM | POA: Diagnosis not present

## 2019-04-01 DIAGNOSIS — I130 Hypertensive heart and chronic kidney disease with heart failure and stage 1 through stage 4 chronic kidney disease, or unspecified chronic kidney disease: Secondary | ICD-10-CM | POA: Diagnosis not present

## 2019-04-01 DIAGNOSIS — N184 Chronic kidney disease, stage 4 (severe): Secondary | ICD-10-CM | POA: Diagnosis not present

## 2019-04-01 DIAGNOSIS — I4819 Other persistent atrial fibrillation: Secondary | ICD-10-CM | POA: Diagnosis not present

## 2019-04-01 DIAGNOSIS — D631 Anemia in chronic kidney disease: Secondary | ICD-10-CM | POA: Diagnosis not present

## 2019-04-03 LAB — BASIC METABOLIC PANEL
BUN/Creatinine Ratio: 22 (ref 12–28)
BUN: 32 mg/dL (ref 10–36)
CO2: 22 mmol/L (ref 20–29)
Calcium: 9.3 mg/dL (ref 8.7–10.3)
Chloride: 98 mmol/L (ref 96–106)
Creatinine, Ser: 1.45 mg/dL — ABNORMAL HIGH (ref 0.57–1.00)
GFR calc Af Amer: 36 mL/min/{1.73_m2} — ABNORMAL LOW (ref >59)
GFR calc non Af Amer: 31 mL/min/{1.73_m2} — ABNORMAL LOW (ref >59)
Glucose: 85 mg/dL (ref 65–99)
Potassium: 4.7 mmol/L (ref 3.5–5.2)
Sodium: 143 mmol/L (ref 134–144)

## 2019-04-03 LAB — CBC WITH DIFFERENTIAL/PLATELET
Basophils Absolute: 0 10*3/uL (ref 0.0–0.2)
Basos: 1 %
EOS (ABSOLUTE): 0.2 10*3/uL (ref 0.0–0.4)
Eos: 2 %
Hematocrit: 35 % (ref 34.0–46.6)
Hemoglobin: 10.6 g/dL — ABNORMAL LOW (ref 11.1–15.9)
Immature Grans (Abs): 0 10*3/uL (ref 0.0–0.1)
Immature Granulocytes: 0 %
Lymphocytes Absolute: 1.9 10*3/uL (ref 0.7–3.1)
Lymphs: 21 %
MCH: 23 pg — ABNORMAL LOW (ref 26.6–33.0)
MCHC: 30.3 g/dL — ABNORMAL LOW (ref 31.5–35.7)
MCV: 76 fL — ABNORMAL LOW (ref 79–97)
Monocytes Absolute: 0.7 10*3/uL (ref 0.1–0.9)
Monocytes: 8 %
Neutrophils Absolute: 5.9 10*3/uL (ref 1.4–7.0)
Neutrophils: 68 %
Platelets: 392 10*3/uL (ref 150–450)
RBC: 4.6 x10E6/uL (ref 3.77–5.28)
RDW: 15.4 % (ref 11.7–15.4)
WBC: 8.7 10*3/uL (ref 3.4–10.8)

## 2019-04-03 LAB — MAGNESIUM: Magnesium: 2.4 mg/dL — ABNORMAL HIGH (ref 1.6–2.3)

## 2019-04-06 ENCOUNTER — Encounter: Payer: Self-pay | Admitting: Podiatry

## 2019-04-06 ENCOUNTER — Ambulatory Visit (INDEPENDENT_AMBULATORY_CARE_PROVIDER_SITE_OTHER): Payer: Medicare Other | Admitting: Podiatry

## 2019-04-06 ENCOUNTER — Other Ambulatory Visit: Payer: Self-pay

## 2019-04-06 VITALS — Temp 97.1°F

## 2019-04-06 DIAGNOSIS — D689 Coagulation defect, unspecified: Secondary | ICD-10-CM

## 2019-04-06 DIAGNOSIS — B351 Tinea unguium: Secondary | ICD-10-CM

## 2019-04-06 DIAGNOSIS — L84 Corns and callosities: Secondary | ICD-10-CM | POA: Diagnosis not present

## 2019-04-06 DIAGNOSIS — M79676 Pain in unspecified toe(s): Secondary | ICD-10-CM

## 2019-04-06 NOTE — Progress Notes (Signed)
Complaint:  Visit Type: Patient returns to my office for continued preventative foot care services. Complaint: Patient states" my nails have grown long and thick and become painful to walk and wear shoes" Patient has been diagnosed with DM with no foot complications. The patient presents for preventative foot care services. No changes to ROS.  Patient is taking eliquiss.  Podiatric Exam: Vascular: dorsalis pedis and posterior tibial pulses are weakly  palpable bilateral. Capillary return is immediate. Temperature gradient is WNL. Skin turgor WNL  Sensorium: Normal Semmes Weinstein monofilament test. Normal tactile sensation bilaterally. Nail Exam: Pt has thick disfigured discolored nails with subungual debris noted bilateral entire nail hallux through fifth toenails Ulcer Exam: There is no evidence of ulcer or pre-ulcerative changes or infection. Orthopedic Exam: Muscle tone and strength are WNL. No limitations in general ROM. No crepitus or effusions noted. Foot type and digits show no abnormalities. HAV  B/L and tailors bunion  B/l. Skin: No Porokeratosis. No infection or ulcers  Diagnosis:  Onychomycosis, , Pain in right toe, pain in left toes  Treatment & Plan Procedures and Treatment: Consent by patient was obtained for treatment procedures.   Debridement of mycotic and hypertrophic toenails, 1 through 5 bilateral and clearing of subungual debris. No ulceration, no infection noted.  Return Visit-Office Procedure: Patient instructed to return to the office for a follow up visit 3 months for continued evaluation and treatment.    Gardiner Barefoot DPM

## 2019-04-08 DIAGNOSIS — I4819 Other persistent atrial fibrillation: Secondary | ICD-10-CM | POA: Diagnosis not present

## 2019-04-08 DIAGNOSIS — D631 Anemia in chronic kidney disease: Secondary | ICD-10-CM | POA: Diagnosis not present

## 2019-04-08 DIAGNOSIS — N184 Chronic kidney disease, stage 4 (severe): Secondary | ICD-10-CM | POA: Diagnosis not present

## 2019-04-08 DIAGNOSIS — I130 Hypertensive heart and chronic kidney disease with heart failure and stage 1 through stage 4 chronic kidney disease, or unspecified chronic kidney disease: Secondary | ICD-10-CM | POA: Diagnosis not present

## 2019-04-08 DIAGNOSIS — I5042 Chronic combined systolic (congestive) and diastolic (congestive) heart failure: Secondary | ICD-10-CM | POA: Diagnosis not present

## 2019-04-08 DIAGNOSIS — Z79899 Other long term (current) drug therapy: Secondary | ICD-10-CM | POA: Diagnosis not present

## 2019-04-08 DIAGNOSIS — J9690 Respiratory failure, unspecified, unspecified whether with hypoxia or hypercapnia: Secondary | ICD-10-CM | POA: Diagnosis not present

## 2019-04-09 ENCOUNTER — Other Ambulatory Visit (HOSPITAL_COMMUNITY): Payer: Self-pay | Admitting: Internal Medicine

## 2019-04-12 DIAGNOSIS — Z96642 Presence of left artificial hip joint: Secondary | ICD-10-CM | POA: Diagnosis not present

## 2019-04-12 DIAGNOSIS — Z452 Encounter for adjustment and management of vascular access device: Secondary | ICD-10-CM | POA: Diagnosis not present

## 2019-04-12 DIAGNOSIS — I5042 Chronic combined systolic (congestive) and diastolic (congestive) heart failure: Secondary | ICD-10-CM | POA: Diagnosis not present

## 2019-04-12 DIAGNOSIS — F0390 Unspecified dementia without behavioral disturbance: Secondary | ICD-10-CM | POA: Diagnosis not present

## 2019-04-12 DIAGNOSIS — E43 Unspecified severe protein-calorie malnutrition: Secondary | ICD-10-CM | POA: Diagnosis not present

## 2019-04-12 DIAGNOSIS — I130 Hypertensive heart and chronic kidney disease with heart failure and stage 1 through stage 4 chronic kidney disease, or unspecified chronic kidney disease: Secondary | ICD-10-CM | POA: Diagnosis not present

## 2019-04-12 DIAGNOSIS — N184 Chronic kidney disease, stage 4 (severe): Secondary | ICD-10-CM | POA: Diagnosis not present

## 2019-04-12 DIAGNOSIS — E782 Mixed hyperlipidemia: Secondary | ICD-10-CM | POA: Diagnosis not present

## 2019-04-12 DIAGNOSIS — F419 Anxiety disorder, unspecified: Secondary | ICD-10-CM | POA: Diagnosis not present

## 2019-04-12 DIAGNOSIS — Z9181 History of falling: Secondary | ICD-10-CM | POA: Diagnosis not present

## 2019-04-12 DIAGNOSIS — I2510 Atherosclerotic heart disease of native coronary artery without angina pectoris: Secondary | ICD-10-CM | POA: Diagnosis not present

## 2019-04-12 DIAGNOSIS — I4819 Other persistent atrial fibrillation: Secondary | ICD-10-CM | POA: Diagnosis not present

## 2019-04-12 DIAGNOSIS — D631 Anemia in chronic kidney disease: Secondary | ICD-10-CM | POA: Diagnosis not present

## 2019-04-12 DIAGNOSIS — J9690 Respiratory failure, unspecified, unspecified whether with hypoxia or hypercapnia: Secondary | ICD-10-CM | POA: Diagnosis not present

## 2019-04-12 DIAGNOSIS — I739 Peripheral vascular disease, unspecified: Secondary | ICD-10-CM | POA: Diagnosis not present

## 2019-04-12 DIAGNOSIS — I252 Old myocardial infarction: Secondary | ICD-10-CM | POA: Diagnosis not present

## 2019-04-12 DIAGNOSIS — I420 Dilated cardiomyopathy: Secondary | ICD-10-CM | POA: Diagnosis not present

## 2019-04-12 DIAGNOSIS — Z7901 Long term (current) use of anticoagulants: Secondary | ICD-10-CM | POA: Diagnosis not present

## 2019-04-13 DIAGNOSIS — I4819 Other persistent atrial fibrillation: Secondary | ICD-10-CM | POA: Diagnosis not present

## 2019-04-13 DIAGNOSIS — I130 Hypertensive heart and chronic kidney disease with heart failure and stage 1 through stage 4 chronic kidney disease, or unspecified chronic kidney disease: Secondary | ICD-10-CM | POA: Diagnosis not present

## 2019-04-13 DIAGNOSIS — J9690 Respiratory failure, unspecified, unspecified whether with hypoxia or hypercapnia: Secondary | ICD-10-CM | POA: Diagnosis not present

## 2019-04-13 DIAGNOSIS — I5042 Chronic combined systolic (congestive) and diastolic (congestive) heart failure: Secondary | ICD-10-CM | POA: Diagnosis not present

## 2019-04-13 DIAGNOSIS — N184 Chronic kidney disease, stage 4 (severe): Secondary | ICD-10-CM | POA: Diagnosis not present

## 2019-04-13 DIAGNOSIS — D631 Anemia in chronic kidney disease: Secondary | ICD-10-CM | POA: Diagnosis not present

## 2019-04-15 DIAGNOSIS — J9690 Respiratory failure, unspecified, unspecified whether with hypoxia or hypercapnia: Secondary | ICD-10-CM | POA: Diagnosis not present

## 2019-04-15 DIAGNOSIS — I4819 Other persistent atrial fibrillation: Secondary | ICD-10-CM | POA: Diagnosis not present

## 2019-04-15 DIAGNOSIS — I5042 Chronic combined systolic (congestive) and diastolic (congestive) heart failure: Secondary | ICD-10-CM | POA: Diagnosis not present

## 2019-04-15 DIAGNOSIS — N184 Chronic kidney disease, stage 4 (severe): Secondary | ICD-10-CM | POA: Diagnosis not present

## 2019-04-15 DIAGNOSIS — I130 Hypertensive heart and chronic kidney disease with heart failure and stage 1 through stage 4 chronic kidney disease, or unspecified chronic kidney disease: Secondary | ICD-10-CM | POA: Diagnosis not present

## 2019-04-15 DIAGNOSIS — D631 Anemia in chronic kidney disease: Secondary | ICD-10-CM | POA: Diagnosis not present

## 2019-04-19 ENCOUNTER — Other Ambulatory Visit (HOSPITAL_COMMUNITY): Payer: Self-pay | Admitting: Internal Medicine

## 2019-04-22 DIAGNOSIS — J9690 Respiratory failure, unspecified, unspecified whether with hypoxia or hypercapnia: Secondary | ICD-10-CM | POA: Diagnosis not present

## 2019-04-22 DIAGNOSIS — I130 Hypertensive heart and chronic kidney disease with heart failure and stage 1 through stage 4 chronic kidney disease, or unspecified chronic kidney disease: Secondary | ICD-10-CM | POA: Diagnosis not present

## 2019-04-22 DIAGNOSIS — I5042 Chronic combined systolic (congestive) and diastolic (congestive) heart failure: Secondary | ICD-10-CM | POA: Diagnosis not present

## 2019-04-22 DIAGNOSIS — N184 Chronic kidney disease, stage 4 (severe): Secondary | ICD-10-CM | POA: Diagnosis not present

## 2019-04-22 DIAGNOSIS — D631 Anemia in chronic kidney disease: Secondary | ICD-10-CM | POA: Diagnosis not present

## 2019-04-22 DIAGNOSIS — I4819 Other persistent atrial fibrillation: Secondary | ICD-10-CM | POA: Diagnosis not present

## 2019-04-29 DIAGNOSIS — I130 Hypertensive heart and chronic kidney disease with heart failure and stage 1 through stage 4 chronic kidney disease, or unspecified chronic kidney disease: Secondary | ICD-10-CM | POA: Diagnosis not present

## 2019-04-29 DIAGNOSIS — I5042 Chronic combined systolic (congestive) and diastolic (congestive) heart failure: Secondary | ICD-10-CM | POA: Diagnosis not present

## 2019-04-29 DIAGNOSIS — N184 Chronic kidney disease, stage 4 (severe): Secondary | ICD-10-CM | POA: Diagnosis not present

## 2019-04-29 DIAGNOSIS — J9690 Respiratory failure, unspecified, unspecified whether with hypoxia or hypercapnia: Secondary | ICD-10-CM | POA: Diagnosis not present

## 2019-04-29 DIAGNOSIS — I4819 Other persistent atrial fibrillation: Secondary | ICD-10-CM | POA: Diagnosis not present

## 2019-04-29 DIAGNOSIS — D631 Anemia in chronic kidney disease: Secondary | ICD-10-CM | POA: Diagnosis not present

## 2019-05-06 DIAGNOSIS — I5042 Chronic combined systolic (congestive) and diastolic (congestive) heart failure: Secondary | ICD-10-CM | POA: Diagnosis not present

## 2019-05-06 DIAGNOSIS — J9690 Respiratory failure, unspecified, unspecified whether with hypoxia or hypercapnia: Secondary | ICD-10-CM | POA: Diagnosis not present

## 2019-05-06 DIAGNOSIS — I130 Hypertensive heart and chronic kidney disease with heart failure and stage 1 through stage 4 chronic kidney disease, or unspecified chronic kidney disease: Secondary | ICD-10-CM | POA: Diagnosis not present

## 2019-05-06 DIAGNOSIS — N184 Chronic kidney disease, stage 4 (severe): Secondary | ICD-10-CM | POA: Diagnosis not present

## 2019-05-06 DIAGNOSIS — D631 Anemia in chronic kidney disease: Secondary | ICD-10-CM | POA: Diagnosis not present

## 2019-05-06 DIAGNOSIS — I4819 Other persistent atrial fibrillation: Secondary | ICD-10-CM | POA: Diagnosis not present

## 2019-05-11 ENCOUNTER — Other Ambulatory Visit (HOSPITAL_COMMUNITY): Payer: Self-pay | Admitting: Internal Medicine

## 2019-05-12 DIAGNOSIS — I4819 Other persistent atrial fibrillation: Secondary | ICD-10-CM | POA: Diagnosis not present

## 2019-05-12 DIAGNOSIS — F419 Anxiety disorder, unspecified: Secondary | ICD-10-CM | POA: Diagnosis not present

## 2019-05-12 DIAGNOSIS — Z96642 Presence of left artificial hip joint: Secondary | ICD-10-CM | POA: Diagnosis not present

## 2019-05-12 DIAGNOSIS — F0390 Unspecified dementia without behavioral disturbance: Secondary | ICD-10-CM | POA: Diagnosis not present

## 2019-05-12 DIAGNOSIS — Z9181 History of falling: Secondary | ICD-10-CM | POA: Diagnosis not present

## 2019-05-12 DIAGNOSIS — I5042 Chronic combined systolic (congestive) and diastolic (congestive) heart failure: Secondary | ICD-10-CM | POA: Diagnosis not present

## 2019-05-12 DIAGNOSIS — Z7901 Long term (current) use of anticoagulants: Secondary | ICD-10-CM | POA: Diagnosis not present

## 2019-05-12 DIAGNOSIS — I2510 Atherosclerotic heart disease of native coronary artery without angina pectoris: Secondary | ICD-10-CM | POA: Diagnosis not present

## 2019-05-12 DIAGNOSIS — E43 Unspecified severe protein-calorie malnutrition: Secondary | ICD-10-CM | POA: Diagnosis not present

## 2019-05-12 DIAGNOSIS — J9690 Respiratory failure, unspecified, unspecified whether with hypoxia or hypercapnia: Secondary | ICD-10-CM | POA: Diagnosis not present

## 2019-05-12 DIAGNOSIS — E782 Mixed hyperlipidemia: Secondary | ICD-10-CM | POA: Diagnosis not present

## 2019-05-12 DIAGNOSIS — N184 Chronic kidney disease, stage 4 (severe): Secondary | ICD-10-CM | POA: Diagnosis not present

## 2019-05-12 DIAGNOSIS — Z452 Encounter for adjustment and management of vascular access device: Secondary | ICD-10-CM | POA: Diagnosis not present

## 2019-05-12 DIAGNOSIS — I130 Hypertensive heart and chronic kidney disease with heart failure and stage 1 through stage 4 chronic kidney disease, or unspecified chronic kidney disease: Secondary | ICD-10-CM | POA: Diagnosis not present

## 2019-05-12 DIAGNOSIS — I252 Old myocardial infarction: Secondary | ICD-10-CM | POA: Diagnosis not present

## 2019-05-12 DIAGNOSIS — D631 Anemia in chronic kidney disease: Secondary | ICD-10-CM | POA: Diagnosis not present

## 2019-05-12 DIAGNOSIS — I420 Dilated cardiomyopathy: Secondary | ICD-10-CM | POA: Diagnosis not present

## 2019-05-12 DIAGNOSIS — I739 Peripheral vascular disease, unspecified: Secondary | ICD-10-CM | POA: Diagnosis not present

## 2019-05-13 DIAGNOSIS — I4819 Other persistent atrial fibrillation: Secondary | ICD-10-CM | POA: Diagnosis not present

## 2019-05-13 DIAGNOSIS — I130 Hypertensive heart and chronic kidney disease with heart failure and stage 1 through stage 4 chronic kidney disease, or unspecified chronic kidney disease: Secondary | ICD-10-CM | POA: Diagnosis not present

## 2019-05-13 DIAGNOSIS — N184 Chronic kidney disease, stage 4 (severe): Secondary | ICD-10-CM | POA: Diagnosis not present

## 2019-05-13 DIAGNOSIS — J9690 Respiratory failure, unspecified, unspecified whether with hypoxia or hypercapnia: Secondary | ICD-10-CM | POA: Diagnosis not present

## 2019-05-13 DIAGNOSIS — D631 Anemia in chronic kidney disease: Secondary | ICD-10-CM | POA: Diagnosis not present

## 2019-05-13 DIAGNOSIS — I5042 Chronic combined systolic (congestive) and diastolic (congestive) heart failure: Secondary | ICD-10-CM | POA: Diagnosis not present

## 2019-05-20 DIAGNOSIS — I130 Hypertensive heart and chronic kidney disease with heart failure and stage 1 through stage 4 chronic kidney disease, or unspecified chronic kidney disease: Secondary | ICD-10-CM | POA: Diagnosis not present

## 2019-05-20 DIAGNOSIS — I5042 Chronic combined systolic (congestive) and diastolic (congestive) heart failure: Secondary | ICD-10-CM | POA: Diagnosis not present

## 2019-05-20 DIAGNOSIS — I4819 Other persistent atrial fibrillation: Secondary | ICD-10-CM | POA: Diagnosis not present

## 2019-05-20 DIAGNOSIS — N184 Chronic kidney disease, stage 4 (severe): Secondary | ICD-10-CM | POA: Diagnosis not present

## 2019-05-20 DIAGNOSIS — J9690 Respiratory failure, unspecified, unspecified whether with hypoxia or hypercapnia: Secondary | ICD-10-CM | POA: Diagnosis not present

## 2019-05-20 DIAGNOSIS — D631 Anemia in chronic kidney disease: Secondary | ICD-10-CM | POA: Diagnosis not present

## 2019-05-21 DIAGNOSIS — I5020 Unspecified systolic (congestive) heart failure: Secondary | ICD-10-CM | POA: Diagnosis not present

## 2019-05-21 DIAGNOSIS — F0390 Unspecified dementia without behavioral disturbance: Secondary | ICD-10-CM | POA: Diagnosis not present

## 2019-05-27 DIAGNOSIS — I4819 Other persistent atrial fibrillation: Secondary | ICD-10-CM | POA: Diagnosis not present

## 2019-05-27 DIAGNOSIS — N184 Chronic kidney disease, stage 4 (severe): Secondary | ICD-10-CM | POA: Diagnosis not present

## 2019-05-27 DIAGNOSIS — I130 Hypertensive heart and chronic kidney disease with heart failure and stage 1 through stage 4 chronic kidney disease, or unspecified chronic kidney disease: Secondary | ICD-10-CM | POA: Diagnosis not present

## 2019-05-27 DIAGNOSIS — J9690 Respiratory failure, unspecified, unspecified whether with hypoxia or hypercapnia: Secondary | ICD-10-CM | POA: Diagnosis not present

## 2019-05-27 DIAGNOSIS — D631 Anemia in chronic kidney disease: Secondary | ICD-10-CM | POA: Diagnosis not present

## 2019-05-27 DIAGNOSIS — I5042 Chronic combined systolic (congestive) and diastolic (congestive) heart failure: Secondary | ICD-10-CM | POA: Diagnosis not present

## 2019-06-03 DIAGNOSIS — I5042 Chronic combined systolic (congestive) and diastolic (congestive) heart failure: Secondary | ICD-10-CM | POA: Diagnosis not present

## 2019-06-03 DIAGNOSIS — J9690 Respiratory failure, unspecified, unspecified whether with hypoxia or hypercapnia: Secondary | ICD-10-CM | POA: Diagnosis not present

## 2019-06-03 DIAGNOSIS — D631 Anemia in chronic kidney disease: Secondary | ICD-10-CM | POA: Diagnosis not present

## 2019-06-03 DIAGNOSIS — I4819 Other persistent atrial fibrillation: Secondary | ICD-10-CM | POA: Diagnosis not present

## 2019-06-03 DIAGNOSIS — I130 Hypertensive heart and chronic kidney disease with heart failure and stage 1 through stage 4 chronic kidney disease, or unspecified chronic kidney disease: Secondary | ICD-10-CM | POA: Diagnosis not present

## 2019-06-03 DIAGNOSIS — N184 Chronic kidney disease, stage 4 (severe): Secondary | ICD-10-CM | POA: Diagnosis not present

## 2019-06-10 ENCOUNTER — Other Ambulatory Visit (HOSPITAL_COMMUNITY): Payer: Self-pay | Admitting: Internal Medicine

## 2019-06-10 DIAGNOSIS — Z79899 Other long term (current) drug therapy: Secondary | ICD-10-CM | POA: Diagnosis not present

## 2019-06-10 DIAGNOSIS — I130 Hypertensive heart and chronic kidney disease with heart failure and stage 1 through stage 4 chronic kidney disease, or unspecified chronic kidney disease: Secondary | ICD-10-CM | POA: Diagnosis not present

## 2019-06-10 DIAGNOSIS — I5042 Chronic combined systolic (congestive) and diastolic (congestive) heart failure: Secondary | ICD-10-CM | POA: Diagnosis not present

## 2019-06-10 DIAGNOSIS — J9690 Respiratory failure, unspecified, unspecified whether with hypoxia or hypercapnia: Secondary | ICD-10-CM | POA: Diagnosis not present

## 2019-06-10 DIAGNOSIS — D631 Anemia in chronic kidney disease: Secondary | ICD-10-CM | POA: Diagnosis not present

## 2019-06-10 DIAGNOSIS — I4819 Other persistent atrial fibrillation: Secondary | ICD-10-CM | POA: Diagnosis not present

## 2019-06-10 DIAGNOSIS — N184 Chronic kidney disease, stage 4 (severe): Secondary | ICD-10-CM | POA: Diagnosis not present

## 2019-06-11 DIAGNOSIS — E782 Mixed hyperlipidemia: Secondary | ICD-10-CM | POA: Diagnosis not present

## 2019-06-11 DIAGNOSIS — I420 Dilated cardiomyopathy: Secondary | ICD-10-CM | POA: Diagnosis not present

## 2019-06-11 DIAGNOSIS — Z7901 Long term (current) use of anticoagulants: Secondary | ICD-10-CM | POA: Diagnosis not present

## 2019-06-11 DIAGNOSIS — J9690 Respiratory failure, unspecified, unspecified whether with hypoxia or hypercapnia: Secondary | ICD-10-CM | POA: Diagnosis not present

## 2019-06-11 DIAGNOSIS — I4819 Other persistent atrial fibrillation: Secondary | ICD-10-CM | POA: Diagnosis not present

## 2019-06-11 DIAGNOSIS — Z96642 Presence of left artificial hip joint: Secondary | ICD-10-CM | POA: Diagnosis not present

## 2019-06-11 DIAGNOSIS — D631 Anemia in chronic kidney disease: Secondary | ICD-10-CM | POA: Diagnosis not present

## 2019-06-11 DIAGNOSIS — I130 Hypertensive heart and chronic kidney disease with heart failure and stage 1 through stage 4 chronic kidney disease, or unspecified chronic kidney disease: Secondary | ICD-10-CM | POA: Diagnosis not present

## 2019-06-11 DIAGNOSIS — R32 Unspecified urinary incontinence: Secondary | ICD-10-CM | POA: Diagnosis not present

## 2019-06-11 DIAGNOSIS — Z452 Encounter for adjustment and management of vascular access device: Secondary | ICD-10-CM | POA: Diagnosis not present

## 2019-06-11 DIAGNOSIS — N184 Chronic kidney disease, stage 4 (severe): Secondary | ICD-10-CM | POA: Diagnosis not present

## 2019-06-11 DIAGNOSIS — I252 Old myocardial infarction: Secondary | ICD-10-CM | POA: Diagnosis not present

## 2019-06-11 DIAGNOSIS — Z9181 History of falling: Secondary | ICD-10-CM | POA: Diagnosis not present

## 2019-06-11 DIAGNOSIS — F419 Anxiety disorder, unspecified: Secondary | ICD-10-CM | POA: Diagnosis not present

## 2019-06-11 DIAGNOSIS — E43 Unspecified severe protein-calorie malnutrition: Secondary | ICD-10-CM | POA: Diagnosis not present

## 2019-06-11 DIAGNOSIS — F0390 Unspecified dementia without behavioral disturbance: Secondary | ICD-10-CM | POA: Diagnosis not present

## 2019-06-11 DIAGNOSIS — I2510 Atherosclerotic heart disease of native coronary artery without angina pectoris: Secondary | ICD-10-CM | POA: Diagnosis not present

## 2019-06-11 DIAGNOSIS — I5042 Chronic combined systolic (congestive) and diastolic (congestive) heart failure: Secondary | ICD-10-CM | POA: Diagnosis not present

## 2019-06-11 DIAGNOSIS — I739 Peripheral vascular disease, unspecified: Secondary | ICD-10-CM | POA: Diagnosis not present

## 2019-06-17 ENCOUNTER — Other Ambulatory Visit (HOSPITAL_COMMUNITY): Payer: Self-pay | Admitting: Internal Medicine

## 2019-06-17 DIAGNOSIS — N184 Chronic kidney disease, stage 4 (severe): Secondary | ICD-10-CM | POA: Diagnosis not present

## 2019-06-17 DIAGNOSIS — D631 Anemia in chronic kidney disease: Secondary | ICD-10-CM | POA: Diagnosis not present

## 2019-06-17 DIAGNOSIS — F0390 Unspecified dementia without behavioral disturbance: Secondary | ICD-10-CM | POA: Diagnosis not present

## 2019-06-17 DIAGNOSIS — I5042 Chronic combined systolic (congestive) and diastolic (congestive) heart failure: Secondary | ICD-10-CM | POA: Diagnosis not present

## 2019-06-17 DIAGNOSIS — I4819 Other persistent atrial fibrillation: Secondary | ICD-10-CM | POA: Diagnosis not present

## 2019-06-17 DIAGNOSIS — I130 Hypertensive heart and chronic kidney disease with heart failure and stage 1 through stage 4 chronic kidney disease, or unspecified chronic kidney disease: Secondary | ICD-10-CM | POA: Diagnosis not present

## 2019-06-24 DIAGNOSIS — F0390 Unspecified dementia without behavioral disturbance: Secondary | ICD-10-CM | POA: Diagnosis not present

## 2019-06-24 DIAGNOSIS — I5042 Chronic combined systolic (congestive) and diastolic (congestive) heart failure: Secondary | ICD-10-CM | POA: Diagnosis not present

## 2019-06-24 DIAGNOSIS — D631 Anemia in chronic kidney disease: Secondary | ICD-10-CM | POA: Diagnosis not present

## 2019-06-24 DIAGNOSIS — I4819 Other persistent atrial fibrillation: Secondary | ICD-10-CM | POA: Diagnosis not present

## 2019-06-24 DIAGNOSIS — N184 Chronic kidney disease, stage 4 (severe): Secondary | ICD-10-CM | POA: Diagnosis not present

## 2019-06-24 DIAGNOSIS — I130 Hypertensive heart and chronic kidney disease with heart failure and stage 1 through stage 4 chronic kidney disease, or unspecified chronic kidney disease: Secondary | ICD-10-CM | POA: Diagnosis not present

## 2019-06-29 ENCOUNTER — Other Ambulatory Visit (HOSPITAL_COMMUNITY): Payer: Self-pay | Admitting: Internal Medicine

## 2019-07-01 DIAGNOSIS — F0390 Unspecified dementia without behavioral disturbance: Secondary | ICD-10-CM | POA: Diagnosis not present

## 2019-07-01 DIAGNOSIS — N184 Chronic kidney disease, stage 4 (severe): Secondary | ICD-10-CM | POA: Diagnosis not present

## 2019-07-01 DIAGNOSIS — I5042 Chronic combined systolic (congestive) and diastolic (congestive) heart failure: Secondary | ICD-10-CM | POA: Diagnosis not present

## 2019-07-01 DIAGNOSIS — D631 Anemia in chronic kidney disease: Secondary | ICD-10-CM | POA: Diagnosis not present

## 2019-07-01 DIAGNOSIS — I130 Hypertensive heart and chronic kidney disease with heart failure and stage 1 through stage 4 chronic kidney disease, or unspecified chronic kidney disease: Secondary | ICD-10-CM | POA: Diagnosis not present

## 2019-07-01 DIAGNOSIS — I4819 Other persistent atrial fibrillation: Secondary | ICD-10-CM | POA: Diagnosis not present

## 2019-07-07 ENCOUNTER — Ambulatory Visit: Payer: Medicare Other | Admitting: Podiatry

## 2019-07-07 ENCOUNTER — Other Ambulatory Visit (HOSPITAL_COMMUNITY): Payer: Self-pay | Admitting: Internal Medicine

## 2019-07-08 DIAGNOSIS — D631 Anemia in chronic kidney disease: Secondary | ICD-10-CM | POA: Diagnosis not present

## 2019-07-08 DIAGNOSIS — N184 Chronic kidney disease, stage 4 (severe): Secondary | ICD-10-CM | POA: Diagnosis not present

## 2019-07-08 DIAGNOSIS — I5042 Chronic combined systolic (congestive) and diastolic (congestive) heart failure: Secondary | ICD-10-CM | POA: Diagnosis not present

## 2019-07-08 DIAGNOSIS — F0390 Unspecified dementia without behavioral disturbance: Secondary | ICD-10-CM | POA: Diagnosis not present

## 2019-07-08 DIAGNOSIS — I130 Hypertensive heart and chronic kidney disease with heart failure and stage 1 through stage 4 chronic kidney disease, or unspecified chronic kidney disease: Secondary | ICD-10-CM | POA: Diagnosis not present

## 2019-07-08 DIAGNOSIS — I4819 Other persistent atrial fibrillation: Secondary | ICD-10-CM | POA: Diagnosis not present

## 2019-07-08 DIAGNOSIS — Z0000 Encounter for general adult medical examination without abnormal findings: Secondary | ICD-10-CM | POA: Diagnosis not present

## 2019-07-11 DIAGNOSIS — Z452 Encounter for adjustment and management of vascular access device: Secondary | ICD-10-CM | POA: Diagnosis not present

## 2019-07-11 DIAGNOSIS — I2510 Atherosclerotic heart disease of native coronary artery without angina pectoris: Secondary | ICD-10-CM | POA: Diagnosis not present

## 2019-07-11 DIAGNOSIS — N184 Chronic kidney disease, stage 4 (severe): Secondary | ICD-10-CM | POA: Diagnosis not present

## 2019-07-11 DIAGNOSIS — I420 Dilated cardiomyopathy: Secondary | ICD-10-CM | POA: Diagnosis not present

## 2019-07-11 DIAGNOSIS — J9690 Respiratory failure, unspecified, unspecified whether with hypoxia or hypercapnia: Secondary | ICD-10-CM | POA: Diagnosis not present

## 2019-07-11 DIAGNOSIS — Z96642 Presence of left artificial hip joint: Secondary | ICD-10-CM | POA: Diagnosis not present

## 2019-07-11 DIAGNOSIS — Z7901 Long term (current) use of anticoagulants: Secondary | ICD-10-CM | POA: Diagnosis not present

## 2019-07-11 DIAGNOSIS — E43 Unspecified severe protein-calorie malnutrition: Secondary | ICD-10-CM | POA: Diagnosis not present

## 2019-07-11 DIAGNOSIS — R32 Unspecified urinary incontinence: Secondary | ICD-10-CM | POA: Diagnosis not present

## 2019-07-11 DIAGNOSIS — F419 Anxiety disorder, unspecified: Secondary | ICD-10-CM | POA: Diagnosis not present

## 2019-07-11 DIAGNOSIS — F0390 Unspecified dementia without behavioral disturbance: Secondary | ICD-10-CM | POA: Diagnosis not present

## 2019-07-11 DIAGNOSIS — E782 Mixed hyperlipidemia: Secondary | ICD-10-CM | POA: Diagnosis not present

## 2019-07-11 DIAGNOSIS — I4819 Other persistent atrial fibrillation: Secondary | ICD-10-CM | POA: Diagnosis not present

## 2019-07-11 DIAGNOSIS — I252 Old myocardial infarction: Secondary | ICD-10-CM | POA: Diagnosis not present

## 2019-07-11 DIAGNOSIS — Z9181 History of falling: Secondary | ICD-10-CM | POA: Diagnosis not present

## 2019-07-11 DIAGNOSIS — I739 Peripheral vascular disease, unspecified: Secondary | ICD-10-CM | POA: Diagnosis not present

## 2019-07-11 DIAGNOSIS — I5042 Chronic combined systolic (congestive) and diastolic (congestive) heart failure: Secondary | ICD-10-CM | POA: Diagnosis not present

## 2019-07-11 DIAGNOSIS — I130 Hypertensive heart and chronic kidney disease with heart failure and stage 1 through stage 4 chronic kidney disease, or unspecified chronic kidney disease: Secondary | ICD-10-CM | POA: Diagnosis not present

## 2019-07-11 DIAGNOSIS — D631 Anemia in chronic kidney disease: Secondary | ICD-10-CM | POA: Diagnosis not present

## 2019-07-13 ENCOUNTER — Encounter: Payer: Self-pay | Admitting: Podiatry

## 2019-07-13 ENCOUNTER — Other Ambulatory Visit: Payer: Self-pay

## 2019-07-13 ENCOUNTER — Ambulatory Visit (INDEPENDENT_AMBULATORY_CARE_PROVIDER_SITE_OTHER): Payer: Medicare Other | Admitting: Podiatry

## 2019-07-13 DIAGNOSIS — B351 Tinea unguium: Secondary | ICD-10-CM | POA: Diagnosis not present

## 2019-07-13 DIAGNOSIS — L84 Corns and callosities: Secondary | ICD-10-CM

## 2019-07-13 DIAGNOSIS — D689 Coagulation defect, unspecified: Secondary | ICD-10-CM

## 2019-07-13 DIAGNOSIS — M79676 Pain in unspecified toe(s): Secondary | ICD-10-CM | POA: Diagnosis not present

## 2019-07-13 NOTE — Progress Notes (Addendum)
Complaint:  Visit Type: Patient returns to my office for continued preventative foot care services. Complaint: Patient states" my nails have grown long and thick and become painful to walk and wear shoes" Patient has been diagnosed with DM with no foot complications. The patient presents for preventative foot care services. No changes to ROS.  Patient is taking eliquiss.  Patient presents to the office with female caregiver.  Podiatric Exam: Vascular: dorsalis pedis and posterior tibial pulses are weakly  palpable bilateral. Capillary return is immediate. Temperature gradient is WNL. Skin turgor WNL  Sensorium: Normal Semmes Weinstein monofilament test. Normal tactile sensation bilaterally. Nail Exam: Pt has thick disfigured discolored nails with subungual debris noted bilateral entire nail hallux through fifth toenails Ulcer Exam: There is no evidence of ulcer or pre-ulcerative changes or infection. Orthopedic Exam: Muscle tone and strength are WNL. No limitations in general ROM. No crepitus or effusions noted. Foot type and digits show no abnormalities. HAV  B/L and tailors bunion  B/l. Skin: No Porokeratosis. No infection or ulcers.  Clavi 3rd toe right foot  Diagnosis:  Onychomycosis, , Pain in right toe, pain in left toes  Treatment & Plan Procedures and Treatment: Consent by patient was obtained for treatment procedures.   Debridement of mycotic and hypertrophic toenails, 1 through 5 bilateral and clearing of subungual debris. No ulceration, no infection noted. Debride clavi. Return Visit-Office Procedure: Patient instructed to return to the office for a follow up visit 3 months for continued evaluation and treatment.    Gardiner Barefoot DPM

## 2019-07-15 DIAGNOSIS — D631 Anemia in chronic kidney disease: Secondary | ICD-10-CM | POA: Diagnosis not present

## 2019-07-15 DIAGNOSIS — I5042 Chronic combined systolic (congestive) and diastolic (congestive) heart failure: Secondary | ICD-10-CM | POA: Diagnosis not present

## 2019-07-15 DIAGNOSIS — F0390 Unspecified dementia without behavioral disturbance: Secondary | ICD-10-CM | POA: Diagnosis not present

## 2019-07-15 DIAGNOSIS — N184 Chronic kidney disease, stage 4 (severe): Secondary | ICD-10-CM | POA: Diagnosis not present

## 2019-07-15 DIAGNOSIS — I130 Hypertensive heart and chronic kidney disease with heart failure and stage 1 through stage 4 chronic kidney disease, or unspecified chronic kidney disease: Secondary | ICD-10-CM | POA: Diagnosis not present

## 2019-07-15 DIAGNOSIS — I4819 Other persistent atrial fibrillation: Secondary | ICD-10-CM | POA: Diagnosis not present

## 2019-07-22 DIAGNOSIS — D631 Anemia in chronic kidney disease: Secondary | ICD-10-CM | POA: Diagnosis not present

## 2019-07-22 DIAGNOSIS — N390 Urinary tract infection, site not specified: Secondary | ICD-10-CM | POA: Diagnosis not present

## 2019-07-22 DIAGNOSIS — F0390 Unspecified dementia without behavioral disturbance: Secondary | ICD-10-CM | POA: Diagnosis not present

## 2019-07-22 DIAGNOSIS — N184 Chronic kidney disease, stage 4 (severe): Secondary | ICD-10-CM | POA: Diagnosis not present

## 2019-07-22 DIAGNOSIS — I4819 Other persistent atrial fibrillation: Secondary | ICD-10-CM | POA: Diagnosis not present

## 2019-07-22 DIAGNOSIS — I5042 Chronic combined systolic (congestive) and diastolic (congestive) heart failure: Secondary | ICD-10-CM | POA: Diagnosis not present

## 2019-07-22 DIAGNOSIS — I130 Hypertensive heart and chronic kidney disease with heart failure and stage 1 through stage 4 chronic kidney disease, or unspecified chronic kidney disease: Secondary | ICD-10-CM | POA: Diagnosis not present

## 2019-07-26 ENCOUNTER — Telehealth (HOSPITAL_COMMUNITY): Payer: Self-pay

## 2019-07-26 NOTE — Telephone Encounter (Signed)
HH orders faxed to [facility]. Confirmation received

## 2019-07-28 DIAGNOSIS — I2510 Atherosclerotic heart disease of native coronary artery without angina pectoris: Secondary | ICD-10-CM | POA: Diagnosis not present

## 2019-07-28 DIAGNOSIS — F0390 Unspecified dementia without behavioral disturbance: Secondary | ICD-10-CM | POA: Diagnosis not present

## 2019-07-28 DIAGNOSIS — I5020 Unspecified systolic (congestive) heart failure: Secondary | ICD-10-CM | POA: Diagnosis not present

## 2019-07-28 DIAGNOSIS — I480 Paroxysmal atrial fibrillation: Secondary | ICD-10-CM | POA: Diagnosis not present

## 2019-07-29 DIAGNOSIS — D631 Anemia in chronic kidney disease: Secondary | ICD-10-CM | POA: Diagnosis not present

## 2019-07-29 DIAGNOSIS — I130 Hypertensive heart and chronic kidney disease with heart failure and stage 1 through stage 4 chronic kidney disease, or unspecified chronic kidney disease: Secondary | ICD-10-CM | POA: Diagnosis not present

## 2019-07-29 DIAGNOSIS — F0390 Unspecified dementia without behavioral disturbance: Secondary | ICD-10-CM | POA: Diagnosis not present

## 2019-07-29 DIAGNOSIS — I4819 Other persistent atrial fibrillation: Secondary | ICD-10-CM | POA: Diagnosis not present

## 2019-07-29 DIAGNOSIS — I5042 Chronic combined systolic (congestive) and diastolic (congestive) heart failure: Secondary | ICD-10-CM | POA: Diagnosis not present

## 2019-07-29 DIAGNOSIS — N184 Chronic kidney disease, stage 4 (severe): Secondary | ICD-10-CM | POA: Diagnosis not present

## 2019-08-02 ENCOUNTER — Other Ambulatory Visit (HOSPITAL_COMMUNITY): Payer: Self-pay | Admitting: Internal Medicine

## 2019-08-05 DIAGNOSIS — I4819 Other persistent atrial fibrillation: Secondary | ICD-10-CM | POA: Diagnosis not present

## 2019-08-05 DIAGNOSIS — I5042 Chronic combined systolic (congestive) and diastolic (congestive) heart failure: Secondary | ICD-10-CM | POA: Diagnosis not present

## 2019-08-05 DIAGNOSIS — D631 Anemia in chronic kidney disease: Secondary | ICD-10-CM | POA: Diagnosis not present

## 2019-08-05 DIAGNOSIS — F0390 Unspecified dementia without behavioral disturbance: Secondary | ICD-10-CM | POA: Diagnosis not present

## 2019-08-05 DIAGNOSIS — N184 Chronic kidney disease, stage 4 (severe): Secondary | ICD-10-CM | POA: Diagnosis not present

## 2019-08-05 DIAGNOSIS — I130 Hypertensive heart and chronic kidney disease with heart failure and stage 1 through stage 4 chronic kidney disease, or unspecified chronic kidney disease: Secondary | ICD-10-CM | POA: Diagnosis not present

## 2019-08-10 DIAGNOSIS — I130 Hypertensive heart and chronic kidney disease with heart failure and stage 1 through stage 4 chronic kidney disease, or unspecified chronic kidney disease: Secondary | ICD-10-CM | POA: Diagnosis not present

## 2019-08-10 DIAGNOSIS — I2510 Atherosclerotic heart disease of native coronary artery without angina pectoris: Secondary | ICD-10-CM | POA: Diagnosis not present

## 2019-08-10 DIAGNOSIS — Z7901 Long term (current) use of anticoagulants: Secondary | ICD-10-CM | POA: Diagnosis not present

## 2019-08-10 DIAGNOSIS — E43 Unspecified severe protein-calorie malnutrition: Secondary | ICD-10-CM | POA: Diagnosis not present

## 2019-08-10 DIAGNOSIS — I420 Dilated cardiomyopathy: Secondary | ICD-10-CM | POA: Diagnosis not present

## 2019-08-10 DIAGNOSIS — F0390 Unspecified dementia without behavioral disturbance: Secondary | ICD-10-CM | POA: Diagnosis not present

## 2019-08-10 DIAGNOSIS — Z452 Encounter for adjustment and management of vascular access device: Secondary | ICD-10-CM | POA: Diagnosis not present

## 2019-08-10 DIAGNOSIS — J9690 Respiratory failure, unspecified, unspecified whether with hypoxia or hypercapnia: Secondary | ICD-10-CM | POA: Diagnosis not present

## 2019-08-10 DIAGNOSIS — E782 Mixed hyperlipidemia: Secondary | ICD-10-CM | POA: Diagnosis not present

## 2019-08-10 DIAGNOSIS — D631 Anemia in chronic kidney disease: Secondary | ICD-10-CM | POA: Diagnosis not present

## 2019-08-10 DIAGNOSIS — I5042 Chronic combined systolic (congestive) and diastolic (congestive) heart failure: Secondary | ICD-10-CM | POA: Diagnosis not present

## 2019-08-10 DIAGNOSIS — I252 Old myocardial infarction: Secondary | ICD-10-CM | POA: Diagnosis not present

## 2019-08-10 DIAGNOSIS — R32 Unspecified urinary incontinence: Secondary | ICD-10-CM | POA: Diagnosis not present

## 2019-08-10 DIAGNOSIS — F419 Anxiety disorder, unspecified: Secondary | ICD-10-CM | POA: Diagnosis not present

## 2019-08-10 DIAGNOSIS — N184 Chronic kidney disease, stage 4 (severe): Secondary | ICD-10-CM | POA: Diagnosis not present

## 2019-08-10 DIAGNOSIS — I4819 Other persistent atrial fibrillation: Secondary | ICD-10-CM | POA: Diagnosis not present

## 2019-08-10 DIAGNOSIS — I739 Peripheral vascular disease, unspecified: Secondary | ICD-10-CM | POA: Diagnosis not present

## 2019-08-10 DIAGNOSIS — Z96642 Presence of left artificial hip joint: Secondary | ICD-10-CM | POA: Diagnosis not present

## 2019-08-10 DIAGNOSIS — Z9181 History of falling: Secondary | ICD-10-CM | POA: Diagnosis not present

## 2019-08-12 DIAGNOSIS — F0390 Unspecified dementia without behavioral disturbance: Secondary | ICD-10-CM | POA: Diagnosis not present

## 2019-08-12 DIAGNOSIS — I4819 Other persistent atrial fibrillation: Secondary | ICD-10-CM | POA: Diagnosis not present

## 2019-08-12 DIAGNOSIS — D631 Anemia in chronic kidney disease: Secondary | ICD-10-CM | POA: Diagnosis not present

## 2019-08-12 DIAGNOSIS — I5042 Chronic combined systolic (congestive) and diastolic (congestive) heart failure: Secondary | ICD-10-CM | POA: Diagnosis not present

## 2019-08-12 DIAGNOSIS — N184 Chronic kidney disease, stage 4 (severe): Secondary | ICD-10-CM | POA: Diagnosis not present

## 2019-08-12 DIAGNOSIS — I130 Hypertensive heart and chronic kidney disease with heart failure and stage 1 through stage 4 chronic kidney disease, or unspecified chronic kidney disease: Secondary | ICD-10-CM | POA: Diagnosis not present

## 2019-08-19 DIAGNOSIS — N184 Chronic kidney disease, stage 4 (severe): Secondary | ICD-10-CM | POA: Diagnosis not present

## 2019-08-19 DIAGNOSIS — I4819 Other persistent atrial fibrillation: Secondary | ICD-10-CM | POA: Diagnosis not present

## 2019-08-19 DIAGNOSIS — D631 Anemia in chronic kidney disease: Secondary | ICD-10-CM | POA: Diagnosis not present

## 2019-08-19 DIAGNOSIS — I130 Hypertensive heart and chronic kidney disease with heart failure and stage 1 through stage 4 chronic kidney disease, or unspecified chronic kidney disease: Secondary | ICD-10-CM | POA: Diagnosis not present

## 2019-08-19 DIAGNOSIS — I5042 Chronic combined systolic (congestive) and diastolic (congestive) heart failure: Secondary | ICD-10-CM | POA: Diagnosis not present

## 2019-08-19 DIAGNOSIS — F0390 Unspecified dementia without behavioral disturbance: Secondary | ICD-10-CM | POA: Diagnosis not present

## 2019-08-26 DIAGNOSIS — F0390 Unspecified dementia without behavioral disturbance: Secondary | ICD-10-CM | POA: Diagnosis not present

## 2019-08-26 DIAGNOSIS — I5042 Chronic combined systolic (congestive) and diastolic (congestive) heart failure: Secondary | ICD-10-CM | POA: Diagnosis not present

## 2019-08-26 DIAGNOSIS — N184 Chronic kidney disease, stage 4 (severe): Secondary | ICD-10-CM | POA: Diagnosis not present

## 2019-08-26 DIAGNOSIS — I4819 Other persistent atrial fibrillation: Secondary | ICD-10-CM | POA: Diagnosis not present

## 2019-08-26 DIAGNOSIS — D631 Anemia in chronic kidney disease: Secondary | ICD-10-CM | POA: Diagnosis not present

## 2019-08-26 DIAGNOSIS — I130 Hypertensive heart and chronic kidney disease with heart failure and stage 1 through stage 4 chronic kidney disease, or unspecified chronic kidney disease: Secondary | ICD-10-CM | POA: Diagnosis not present

## 2019-09-02 DIAGNOSIS — N184 Chronic kidney disease, stage 4 (severe): Secondary | ICD-10-CM | POA: Diagnosis not present

## 2019-09-02 DIAGNOSIS — Z7983 Long term (current) use of bisphosphonates: Secondary | ICD-10-CM | POA: Diagnosis not present

## 2019-09-02 DIAGNOSIS — I4819 Other persistent atrial fibrillation: Secondary | ICD-10-CM | POA: Diagnosis not present

## 2019-09-02 DIAGNOSIS — F0390 Unspecified dementia without behavioral disturbance: Secondary | ICD-10-CM | POA: Diagnosis not present

## 2019-09-02 DIAGNOSIS — I5042 Chronic combined systolic (congestive) and diastolic (congestive) heart failure: Secondary | ICD-10-CM | POA: Diagnosis not present

## 2019-09-02 DIAGNOSIS — D631 Anemia in chronic kidney disease: Secondary | ICD-10-CM | POA: Diagnosis not present

## 2019-09-02 DIAGNOSIS — I130 Hypertensive heart and chronic kidney disease with heart failure and stage 1 through stage 4 chronic kidney disease, or unspecified chronic kidney disease: Secondary | ICD-10-CM | POA: Diagnosis not present

## 2019-09-08 ENCOUNTER — Ambulatory Visit: Payer: Medicare Other | Admitting: Podiatry

## 2019-09-09 ENCOUNTER — Other Ambulatory Visit (HOSPITAL_COMMUNITY): Payer: Self-pay | Admitting: Internal Medicine

## 2019-09-09 DIAGNOSIS — Z9181 History of falling: Secondary | ICD-10-CM | POA: Diagnosis not present

## 2019-09-09 DIAGNOSIS — F419 Anxiety disorder, unspecified: Secondary | ICD-10-CM | POA: Diagnosis not present

## 2019-09-09 DIAGNOSIS — I2510 Atherosclerotic heart disease of native coronary artery without angina pectoris: Secondary | ICD-10-CM | POA: Diagnosis not present

## 2019-09-09 DIAGNOSIS — E43 Unspecified severe protein-calorie malnutrition: Secondary | ICD-10-CM | POA: Diagnosis not present

## 2019-09-09 DIAGNOSIS — I739 Peripheral vascular disease, unspecified: Secondary | ICD-10-CM | POA: Diagnosis not present

## 2019-09-09 DIAGNOSIS — R32 Unspecified urinary incontinence: Secondary | ICD-10-CM | POA: Diagnosis not present

## 2019-09-09 DIAGNOSIS — I4819 Other persistent atrial fibrillation: Secondary | ICD-10-CM | POA: Diagnosis not present

## 2019-09-09 DIAGNOSIS — J9690 Respiratory failure, unspecified, unspecified whether with hypoxia or hypercapnia: Secondary | ICD-10-CM | POA: Diagnosis not present

## 2019-09-09 DIAGNOSIS — I420 Dilated cardiomyopathy: Secondary | ICD-10-CM | POA: Diagnosis not present

## 2019-09-09 DIAGNOSIS — Z96642 Presence of left artificial hip joint: Secondary | ICD-10-CM | POA: Diagnosis not present

## 2019-09-09 DIAGNOSIS — E782 Mixed hyperlipidemia: Secondary | ICD-10-CM | POA: Diagnosis not present

## 2019-09-09 DIAGNOSIS — D631 Anemia in chronic kidney disease: Secondary | ICD-10-CM | POA: Diagnosis not present

## 2019-09-09 DIAGNOSIS — Z7901 Long term (current) use of anticoagulants: Secondary | ICD-10-CM | POA: Diagnosis not present

## 2019-09-09 DIAGNOSIS — F0390 Unspecified dementia without behavioral disturbance: Secondary | ICD-10-CM | POA: Diagnosis not present

## 2019-09-09 DIAGNOSIS — Z452 Encounter for adjustment and management of vascular access device: Secondary | ICD-10-CM | POA: Diagnosis not present

## 2019-09-09 DIAGNOSIS — I252 Old myocardial infarction: Secondary | ICD-10-CM | POA: Diagnosis not present

## 2019-09-09 DIAGNOSIS — N184 Chronic kidney disease, stage 4 (severe): Secondary | ICD-10-CM | POA: Diagnosis not present

## 2019-09-09 DIAGNOSIS — I5042 Chronic combined systolic (congestive) and diastolic (congestive) heart failure: Secondary | ICD-10-CM | POA: Diagnosis not present

## 2019-09-09 DIAGNOSIS — I130 Hypertensive heart and chronic kidney disease with heart failure and stage 1 through stage 4 chronic kidney disease, or unspecified chronic kidney disease: Secondary | ICD-10-CM | POA: Diagnosis not present

## 2019-09-15 ENCOUNTER — Other Ambulatory Visit (HOSPITAL_COMMUNITY): Payer: Self-pay | Admitting: Internal Medicine

## 2019-09-16 ENCOUNTER — Ambulatory Visit (HOSPITAL_COMMUNITY)
Admission: RE | Admit: 2019-09-16 | Discharge: 2019-09-16 | Disposition: A | Discharge status: Home / Self Care | Payer: Medicare Other | Source: Ambulatory Visit | Attending: Internal Medicine | Admitting: Internal Medicine

## 2019-09-16 ENCOUNTER — Other Ambulatory Visit: Payer: Self-pay

## 2019-09-16 DIAGNOSIS — N184 Chronic kidney disease, stage 4 (severe): Secondary | ICD-10-CM | POA: Diagnosis not present

## 2019-09-16 DIAGNOSIS — I130 Hypertensive heart and chronic kidney disease with heart failure and stage 1 through stage 4 chronic kidney disease, or unspecified chronic kidney disease: Secondary | ICD-10-CM | POA: Diagnosis not present

## 2019-09-16 DIAGNOSIS — F0390 Unspecified dementia without behavioral disturbance: Secondary | ICD-10-CM | POA: Diagnosis not present

## 2019-09-16 DIAGNOSIS — I4819 Other persistent atrial fibrillation: Secondary | ICD-10-CM | POA: Diagnosis not present

## 2019-09-16 DIAGNOSIS — I5042 Chronic combined systolic (congestive) and diastolic (congestive) heart failure: Secondary | ICD-10-CM | POA: Diagnosis not present

## 2019-09-16 DIAGNOSIS — D631 Anemia in chronic kidney disease: Secondary | ICD-10-CM | POA: Diagnosis not present

## 2019-09-16 NOTE — Progress Notes (Signed)
No show

## 2019-09-21 ENCOUNTER — Ambulatory Visit (INDEPENDENT_AMBULATORY_CARE_PROVIDER_SITE_OTHER): Payer: Medicare Other | Admitting: Podiatry

## 2019-09-21 ENCOUNTER — Other Ambulatory Visit: Payer: Self-pay

## 2019-09-21 ENCOUNTER — Encounter: Payer: Self-pay | Admitting: Podiatry

## 2019-09-21 DIAGNOSIS — L84 Corns and callosities: Secondary | ICD-10-CM

## 2019-09-21 DIAGNOSIS — D689 Coagulation defect, unspecified: Secondary | ICD-10-CM | POA: Diagnosis not present

## 2019-09-21 DIAGNOSIS — Z20822 Contact with and (suspected) exposure to covid-19: Secondary | ICD-10-CM

## 2019-09-21 DIAGNOSIS — M79676 Pain in unspecified toe(s): Secondary | ICD-10-CM | POA: Diagnosis not present

## 2019-09-21 DIAGNOSIS — B351 Tinea unguium: Secondary | ICD-10-CM | POA: Diagnosis not present

## 2019-09-21 DIAGNOSIS — Z20828 Contact with and (suspected) exposure to other viral communicable diseases: Secondary | ICD-10-CM | POA: Diagnosis not present

## 2019-09-21 NOTE — Progress Notes (Signed)
Complaint:  Visit Type: Patient returns to my office for continued preventative foot care services. Complaint: Patient states" my nails have grown long and thick and become painful to walk and wear shoes" Patient has been diagnosed with DM with no foot complications. The patient presents for preventative foot care services. No changes to ROS.  Patient is taking eliquiss.  Patient presents to the office with female caregiver.  Podiatric Exam: Vascular: dorsalis pedis and posterior tibial pulses are weakly  palpable bilateral. Capillary return is immediate. Temperature gradient is WNL. Skin turgor WNL  Sensorium: Normal Semmes Weinstein monofilament test. Normal tactile sensation bilaterally. Nail Exam: Pt has thick disfigured discolored nails with subungual debris noted bilateral entire nail hallux through fifth toenails Ulcer Exam: There is no evidence of ulcer or pre-ulcerative changes or infection. Orthopedic Exam: Muscle tone and strength are WNL. No limitations in general ROM. No crepitus or effusions noted. Foot type and digits show no abnormalities. HAV  B/L and tailors bunion  B/l. Skin: No Porokeratosis. No infection or ulcers.  Clavi 3rd toe right foot  Diagnosis:  Onychomycosis, , Pain in right toe, pain in left toes  Treatment & Plan Procedures and Treatment: Consent by patient was obtained for treatment procedures.   Debridement of mycotic and hypertrophic toenails, 1 through 5 bilateral and clearing of subungual debris. No ulceration, no infection noted. Debride clavi. Return Visit-Office Procedure: Patient instructed to return to the office for a follow up visit 3 months for continued evaluation and treatment.    Gardiner Barefoot DPM

## 2019-09-22 ENCOUNTER — Other Ambulatory Visit (HOSPITAL_COMMUNITY): Payer: Self-pay | Admitting: Internal Medicine

## 2019-09-23 ENCOUNTER — Telehealth: Payer: Self-pay | Admitting: Nurse Practitioner

## 2019-09-23 DIAGNOSIS — N184 Chronic kidney disease, stage 4 (severe): Secondary | ICD-10-CM | POA: Diagnosis not present

## 2019-09-23 DIAGNOSIS — I4819 Other persistent atrial fibrillation: Secondary | ICD-10-CM | POA: Diagnosis not present

## 2019-09-23 DIAGNOSIS — F0390 Unspecified dementia without behavioral disturbance: Secondary | ICD-10-CM | POA: Diagnosis not present

## 2019-09-23 DIAGNOSIS — D631 Anemia in chronic kidney disease: Secondary | ICD-10-CM | POA: Diagnosis not present

## 2019-09-23 DIAGNOSIS — I5042 Chronic combined systolic (congestive) and diastolic (congestive) heart failure: Secondary | ICD-10-CM | POA: Diagnosis not present

## 2019-09-23 DIAGNOSIS — I130 Hypertensive heart and chronic kidney disease with heart failure and stage 1 through stage 4 chronic kidney disease, or unspecified chronic kidney disease: Secondary | ICD-10-CM | POA: Diagnosis not present

## 2019-09-23 LAB — NOVEL CORONAVIRUS, NAA: SARS-CoV-2, NAA: NOT DETECTED

## 2019-09-23 NOTE — Telephone Encounter (Signed)
   Pts niece called to report that the pts home health staff reported to her (the niece) that Jacqueline Reilly has been sounding more congested.  In a three-way call, I also spoke to her caregiver.  Her caregiver says that she has been ambulating w/o difficulty and has not had any recent change in weight.  She has not had any lower ext swelling other than her right knee (xray pending - ordered by pcp).  No complaints of abd fullness, orthopnea, or early satiety.  Thay have already been advised that she may use mucinex DM and otc robitussin.  I concur.  If however, she develops dyspnea, edema, or is noted to have wt gain, they should either call back for direction re: torsemide or present to urgent care or ED for in-person eval.  Caller verbalized understanding and was grateful for the call back.  Murray Hodgkins, NP 09/23/2019, 11:11 AM

## 2019-09-29 ENCOUNTER — Telehealth (HOSPITAL_COMMUNITY): Payer: Medicare Other | Admitting: Internal Medicine

## 2019-09-30 ENCOUNTER — Other Ambulatory Visit: Payer: Self-pay

## 2019-09-30 ENCOUNTER — Ambulatory Visit (HOSPITAL_COMMUNITY)
Admission: RE | Admit: 2019-09-30 | Discharge: 2019-09-30 | Disposition: A | Discharge status: Home / Self Care | Payer: Medicare Other | Source: Ambulatory Visit | Attending: Internal Medicine | Admitting: Internal Medicine

## 2019-09-30 DIAGNOSIS — F0390 Unspecified dementia without behavioral disturbance: Secondary | ICD-10-CM | POA: Diagnosis not present

## 2019-09-30 DIAGNOSIS — N184 Chronic kidney disease, stage 4 (severe): Secondary | ICD-10-CM | POA: Diagnosis not present

## 2019-09-30 DIAGNOSIS — I5022 Chronic systolic (congestive) heart failure: Secondary | ICD-10-CM

## 2019-09-30 DIAGNOSIS — D631 Anemia in chronic kidney disease: Secondary | ICD-10-CM | POA: Diagnosis not present

## 2019-09-30 DIAGNOSIS — I350 Nonrheumatic aortic (valve) stenosis: Secondary | ICD-10-CM

## 2019-09-30 DIAGNOSIS — I130 Hypertensive heart and chronic kidney disease with heart failure and stage 1 through stage 4 chronic kidney disease, or unspecified chronic kidney disease: Secondary | ICD-10-CM | POA: Diagnosis not present

## 2019-09-30 DIAGNOSIS — I4819 Other persistent atrial fibrillation: Secondary | ICD-10-CM | POA: Diagnosis not present

## 2019-09-30 DIAGNOSIS — I480 Paroxysmal atrial fibrillation: Secondary | ICD-10-CM

## 2019-09-30 DIAGNOSIS — I5042 Chronic combined systolic (congestive) and diastolic (congestive) heart failure: Secondary | ICD-10-CM | POA: Diagnosis not present

## 2019-09-30 NOTE — Progress Notes (Signed)
Heart Failure TeleHealth Note  Due to national recommendations of social distancing due to [place] 19, Audio/video telehealth visit is felt to be most appropriate for this patient at this time.  See MyChart message from today for patient consent regarding telehealth for Jacqueline Reilly.  Date:  09/30/2019   ID:  Jacqueline Reilly, DOB April 01, 1924, MRN [id number]  Location: Home  Provider location: [facility] Type of Visit: Established patient  PCP:  [name], MD  Cardiologist:  No primary care provider on file. Primary HF: [place]  Chief Complaint: Heart Failure follow-up   History of Present Illness:  Jacqueline Reilly is a 85 y.o. female with CAD, AAA, systolic HF, mild dementia, afib and CAD. She has had two previous coronary interventions including a cutting balloon to her D2 in 2007 and a DES to her mid LCX in 2010. Her LV dysfunction has been out of proportion to her CAD.  Last echo in 8/18 showed EF 25-30% (on milrinone) with severe LV dilation.  Admitted 7/11-7/24/15 with syncope and dyspnea. She had new onset A-fib/RVR. Troponin was 1.03> 1.59> 2.18 . She was placed on amiodarone and heparin drip. She developed respiratory distress and required intubation. She converted to NSR but later was bradycardiac with NSVT. Amiodarone temporarily stopped but restarted. Co-ox 29% and started on milrinone which we tried to wean off but she did not tolerate. Discharge weight 87 lbs.   Echo 8/16  EF 25% with diffuse hypokinesis, mild AS/mild AI, RV mildly dilated with moderately decreased systolic function.  Echo 2/18 EF 25-30% Echo 8/18 EF 25-30%  Remains on milrinone.   She presents via [facility], [employer] (consulting) for a telehealth visit today. Her caretaker is with her to facilitate the call. She remains on milrinone. Caretaker says recently had a cough with some nasal congestion. Was treated with mucinex and now much better. Has HHRN coming out every Thursday.  Denies any recent edema. Walking much better. Appetite improved.   Jacqueline Reilly denies symptoms worrisome for COVID 19.   Past Medical History:  Diagnosis Date  . AAA (abdominal aortic aneurysm) ([place])   . Atrial fibrillation ([place])   . Chronic systolic heart failure ([place])    a. ECHO (04/2014) EF 20-25%, diff HK, mild MR  . Coronary artery disease 2007   moderate ASCAD of the left system s/p PCI of the D2 and PCI of the left circ 03/2009  . Hyperlipidemia   . Hypertension   . PVD (peripheral vascular disease) ([place])   . Renal artery stenosis ([place])   . Ventricular dysfunction    left; ischemic   Past Surgical History:  Procedure Laterality Date  . ANGIOPLASTY    . ANTERIOR APPROACH HEMI HIP ARTHROPLASTY Left 03/05/2017   Procedure: ANTERIOR APPROACH LEFT HIP HEMI ARTHROPLASTY;  Surgeon: Leandrew Koyanagi, MD;  Location: [place];  Service: Orthopedics;  Laterality: Left;  . CORONARY STENT PLACEMENT    . IR GENERIC HISTORICAL  09/25/2016   IR FLUORO GUIDE CV LINE RIGHT 09/25/2016 Ardis Rowan, PA-C [facility]  . retinal cryopexy     right eye (for retinal detachment)     Current Outpatient Medications  Medication Sig Dispense Refill  . acetaminophen (TYLENOL) 500 MG tablet Take 1,000 mg by mouth 3 (three) times daily.    Marland Kitchen apixaban (ELIQUIS) 2.5 MG TABS tablet Take 1 tablet (2.5 mg total) by mouth 2 (two) times daily. 180 tablet 3  . busPIRone (BUSPAR) 5 MG  tablet Take 5 mg by mouth daily.     . cefUROXime (CEFTIN) 500 MG tablet Take 1 tablet (500 mg total) by mouth daily. 8 tablet 0  . lisinopril (PRINIVIL,ZESTRIL) 2.5 MG tablet Take 2.5 mg by mouth daily.  1  . milrinone (PRIMACOR) 20 MG/100 ML SOLN infusion Inject 0.125 mcg/kg/min into the vein continuous. Rate 1.6 ml/hr Weight= 43 kg    . Multiple Vitamin (MULTIVITAMIN) tablet Take 1 tablet by mouth daily. 30 tablet 1  . OLANZapine (ZYPREXA) 2.5 MG tablet     . Omega-3 Fatty Acids (FISH OIL) 1000 MG CAPS Take 1 capsule by  mouth 2 (two) times daily.     Marland Kitchen PACERONE 200 MG tablet 1/2 TABLET ONCE A DAY 45 tablet 1  . torsemide (DEMADEX) 20 MG tablet Take 2 tablets (40 mg total) by mouth 2 (two) times daily. 60 tablet 0   No current facility-administered medications for this encounter.     Allergies:   Codeine and Garlic   Social History:  The patient  reports that she has never smoked. She has never used smokeless tobacco. She reports current alcohol use. She reports that she does not use drugs.   Family History:  The patient's family history includes Heart disease in her brother, father, and mother.   ROS:  Please see the history of present illness.   All other systems are personally reviewed and negative.   Vitals 96.9  BP 118/54 HR 84 O2 93% Weight 110  Exam:  (Video/Tele Health Call; Exam is subjective and or/visual.) General:  Elderly confused No resp difficulty. Lungs: Normal respiratory effort with conversation.  Abdomen: Non-distended per patient report Extremities: Pt denies edema. Neuro: Alert confused  Recent Labs: 04/01/2019: BUN 32; Creatinine, Ser 1.45; Hemoglobin 10.6; Magnesium 2.4; Platelets 392; Potassium 4.7; Sodium 143  Personally reviewed   Wt Readings from Last 3 Encounters:  12/18/18 50.7 kg (111 lb 12.8 oz)  08/17/18 48.4 kg (106 lb 12.8 oz)  01/10/18 44 kg (97 lb)      ASSESSMENT AND PLAN:  1) Chronic Systolic Heart Failure: Mixed ischemic/nonischemic cardiomyopathy (degree of LV dysfunction is out of proportion to CAD), EF 25% with moderate RV systolic dysfunction .  Echo  8/18 EF 25-30% Mild AS - Previously longstanding NYHA IV. Now on milrinone with stable NYHA III symptoms  - Volume status appears stable. Weight stable at 110  - Continue torsemide 40 mg BID - Continue lisinopril 2.5 mg daily  - Continue home milrinone 0.125 mcg/kg/min for palliative purposes. Has failed weans in the past.  - Continue HH aide and AHC support. Recent labs ok. 2) Atrial fibrillation:  -  Paroxysmal.   - Regular on BP cuff - Denies bleeding on apixaban 2.5 mg BID. (weight less than 60 kg and age >65).  - Continue amiodarone 100 mg daily.   3) Limited Code: No CPR or defibrillation 4) CAD:  - No CP    - Off ASA with Eliquis. Intolerant of statins in past.  5) Dementia; - Stable.  6) Aortic stenosis - Mild by echo 8/18. Follow.    COVID screen The patient does not have any symptoms that suggest any further testing/ screening at this time.  Social distancing reinforced today.  Recommended follow-up:  3 months   Relevant cardiac medications were reviewed at length with the patient today.   The patient does not have concerns regarding their medications at this time.   The following changes were made today:  As  above  Today, I have spent 14 minutes with the patient with telehealth technology discussing the above issues .    Signed, Glori Bickers, MD  09/30/2019 4:02 PM  [facility] [place] [address] [facility] and [place] [address] [phone] (office) [phone] (fax)

## 2019-10-01 ENCOUNTER — Ambulatory Visit
Admission: RE | Admit: 2019-10-01 | Discharge: 2019-10-01 | Disposition: A | Discharge status: Home / Self Care | Payer: Medicare Other | Source: Ambulatory Visit | Attending: Internal Medicine | Admitting: Internal Medicine

## 2019-10-01 ENCOUNTER — Other Ambulatory Visit: Payer: Self-pay | Admitting: Internal Medicine

## 2019-10-01 ENCOUNTER — Encounter (HOSPITAL_COMMUNITY): Payer: Self-pay | Admitting: *Deleted

## 2019-10-01 DIAGNOSIS — R059 Cough, unspecified: Secondary | ICD-10-CM

## 2019-10-01 DIAGNOSIS — R0600 Dyspnea, unspecified: Secondary | ICD-10-CM

## 2019-10-01 DIAGNOSIS — R05 Cough: Secondary | ICD-10-CM

## 2019-10-01 DIAGNOSIS — R0602 Shortness of breath: Secondary | ICD-10-CM | POA: Diagnosis not present

## 2019-10-01 DIAGNOSIS — F419 Anxiety disorder, unspecified: Secondary | ICD-10-CM | POA: Diagnosis not present

## 2019-10-01 DIAGNOSIS — I5020 Unspecified systolic (congestive) heart failure: Secondary | ICD-10-CM | POA: Diagnosis not present

## 2019-10-01 DIAGNOSIS — J189 Pneumonia, unspecified organism: Secondary | ICD-10-CM | POA: Diagnosis not present

## 2019-10-01 NOTE — Progress Notes (Addendum)
Message sent to schedulers to arrange f/u, AVS sent via mychart

## 2019-10-01 NOTE — Addendum Note (Signed)
Encounter addended by: Scarlette Calico, RN on: 10/01/2019 10:10 AM  Actions taken: Clinical Note Signed

## 2019-10-01 NOTE — Patient Instructions (Signed)
Please continue current medications  Our office will contact you to schedule your 3 month tele-health visit

## 2019-10-06 ENCOUNTER — Other Ambulatory Visit (HOSPITAL_COMMUNITY): Payer: Self-pay | Admitting: Internal Medicine

## 2019-10-07 DIAGNOSIS — F0390 Unspecified dementia without behavioral disturbance: Secondary | ICD-10-CM | POA: Diagnosis not present

## 2019-10-07 DIAGNOSIS — Z79899 Other long term (current) drug therapy: Secondary | ICD-10-CM | POA: Diagnosis not present

## 2019-10-07 DIAGNOSIS — I4819 Other persistent atrial fibrillation: Secondary | ICD-10-CM | POA: Diagnosis not present

## 2019-10-07 DIAGNOSIS — D631 Anemia in chronic kidney disease: Secondary | ICD-10-CM | POA: Diagnosis not present

## 2019-10-07 DIAGNOSIS — I130 Hypertensive heart and chronic kidney disease with heart failure and stage 1 through stage 4 chronic kidney disease, or unspecified chronic kidney disease: Secondary | ICD-10-CM | POA: Diagnosis not present

## 2019-10-07 DIAGNOSIS — N184 Chronic kidney disease, stage 4 (severe): Secondary | ICD-10-CM | POA: Diagnosis not present

## 2019-10-07 DIAGNOSIS — I5042 Chronic combined systolic (congestive) and diastolic (congestive) heart failure: Secondary | ICD-10-CM | POA: Diagnosis not present

## 2019-10-08 DIAGNOSIS — J189 Pneumonia, unspecified organism: Secondary | ICD-10-CM | POA: Diagnosis not present

## 2019-10-09 DIAGNOSIS — I739 Peripheral vascular disease, unspecified: Secondary | ICD-10-CM | POA: Diagnosis not present

## 2019-10-09 DIAGNOSIS — I4819 Other persistent atrial fibrillation: Secondary | ICD-10-CM | POA: Diagnosis not present

## 2019-10-09 DIAGNOSIS — J9690 Respiratory failure, unspecified, unspecified whether with hypoxia or hypercapnia: Secondary | ICD-10-CM | POA: Diagnosis not present

## 2019-10-09 DIAGNOSIS — I5042 Chronic combined systolic (congestive) and diastolic (congestive) heart failure: Secondary | ICD-10-CM | POA: Diagnosis not present

## 2019-10-09 DIAGNOSIS — Z7901 Long term (current) use of anticoagulants: Secondary | ICD-10-CM | POA: Diagnosis not present

## 2019-10-09 DIAGNOSIS — Z96642 Presence of left artificial hip joint: Secondary | ICD-10-CM | POA: Diagnosis not present

## 2019-10-09 DIAGNOSIS — F419 Anxiety disorder, unspecified: Secondary | ICD-10-CM | POA: Diagnosis not present

## 2019-10-09 DIAGNOSIS — Z452 Encounter for adjustment and management of vascular access device: Secondary | ICD-10-CM | POA: Diagnosis not present

## 2019-10-09 DIAGNOSIS — E43 Unspecified severe protein-calorie malnutrition: Secondary | ICD-10-CM | POA: Diagnosis not present

## 2019-10-09 DIAGNOSIS — I2510 Atherosclerotic heart disease of native coronary artery without angina pectoris: Secondary | ICD-10-CM | POA: Diagnosis not present

## 2019-10-09 DIAGNOSIS — I252 Old myocardial infarction: Secondary | ICD-10-CM | POA: Diagnosis not present

## 2019-10-09 DIAGNOSIS — N184 Chronic kidney disease, stage 4 (severe): Secondary | ICD-10-CM | POA: Diagnosis not present

## 2019-10-09 DIAGNOSIS — D631 Anemia in chronic kidney disease: Secondary | ICD-10-CM | POA: Diagnosis not present

## 2019-10-09 DIAGNOSIS — I130 Hypertensive heart and chronic kidney disease with heart failure and stage 1 through stage 4 chronic kidney disease, or unspecified chronic kidney disease: Secondary | ICD-10-CM | POA: Diagnosis not present

## 2019-10-09 DIAGNOSIS — E782 Mixed hyperlipidemia: Secondary | ICD-10-CM | POA: Diagnosis not present

## 2019-10-09 DIAGNOSIS — F0390 Unspecified dementia without behavioral disturbance: Secondary | ICD-10-CM | POA: Diagnosis not present

## 2019-10-09 DIAGNOSIS — I420 Dilated cardiomyopathy: Secondary | ICD-10-CM | POA: Diagnosis not present

## 2019-10-09 DIAGNOSIS — Z9181 History of falling: Secondary | ICD-10-CM | POA: Diagnosis not present

## 2019-10-12 ENCOUNTER — Ambulatory Visit: Payer: Medicare Other | Admitting: Podiatry

## 2019-10-14 DIAGNOSIS — F0390 Unspecified dementia without behavioral disturbance: Secondary | ICD-10-CM | POA: Diagnosis not present

## 2019-10-14 DIAGNOSIS — I5042 Chronic combined systolic (congestive) and diastolic (congestive) heart failure: Secondary | ICD-10-CM | POA: Diagnosis not present

## 2019-10-14 DIAGNOSIS — D631 Anemia in chronic kidney disease: Secondary | ICD-10-CM | POA: Diagnosis not present

## 2019-10-14 DIAGNOSIS — N184 Chronic kidney disease, stage 4 (severe): Secondary | ICD-10-CM | POA: Diagnosis not present

## 2019-10-14 DIAGNOSIS — I4819 Other persistent atrial fibrillation: Secondary | ICD-10-CM | POA: Diagnosis not present

## 2019-10-14 DIAGNOSIS — I130 Hypertensive heart and chronic kidney disease with heart failure and stage 1 through stage 4 chronic kidney disease, or unspecified chronic kidney disease: Secondary | ICD-10-CM | POA: Diagnosis not present

## 2019-10-21 DIAGNOSIS — I5042 Chronic combined systolic (congestive) and diastolic (congestive) heart failure: Secondary | ICD-10-CM | POA: Diagnosis not present

## 2019-10-21 DIAGNOSIS — I4819 Other persistent atrial fibrillation: Secondary | ICD-10-CM | POA: Diagnosis not present

## 2019-10-21 DIAGNOSIS — F0390 Unspecified dementia without behavioral disturbance: Secondary | ICD-10-CM | POA: Diagnosis not present

## 2019-10-21 DIAGNOSIS — I130 Hypertensive heart and chronic kidney disease with heart failure and stage 1 through stage 4 chronic kidney disease, or unspecified chronic kidney disease: Secondary | ICD-10-CM | POA: Diagnosis not present

## 2019-10-21 DIAGNOSIS — N184 Chronic kidney disease, stage 4 (severe): Secondary | ICD-10-CM | POA: Diagnosis not present

## 2019-10-21 DIAGNOSIS — D631 Anemia in chronic kidney disease: Secondary | ICD-10-CM | POA: Diagnosis not present

## 2019-10-28 ENCOUNTER — Other Ambulatory Visit: Payer: Self-pay | Admitting: Cardiology

## 2019-10-28 DIAGNOSIS — I4819 Other persistent atrial fibrillation: Secondary | ICD-10-CM | POA: Diagnosis not present

## 2019-10-28 DIAGNOSIS — D631 Anemia in chronic kidney disease: Secondary | ICD-10-CM | POA: Diagnosis not present

## 2019-10-28 DIAGNOSIS — N184 Chronic kidney disease, stage 4 (severe): Secondary | ICD-10-CM | POA: Diagnosis not present

## 2019-10-28 DIAGNOSIS — I5042 Chronic combined systolic (congestive) and diastolic (congestive) heart failure: Secondary | ICD-10-CM | POA: Diagnosis not present

## 2019-10-28 DIAGNOSIS — F0390 Unspecified dementia without behavioral disturbance: Secondary | ICD-10-CM | POA: Diagnosis not present

## 2019-10-28 DIAGNOSIS — I130 Hypertensive heart and chronic kidney disease with heart failure and stage 1 through stage 4 chronic kidney disease, or unspecified chronic kidney disease: Secondary | ICD-10-CM | POA: Diagnosis not present

## 2019-11-01 LAB — BASIC METABOLIC PANEL
BUN/Creatinine Ratio: 18 (ref 12–28)
BUN: 23 mg/dL (ref 10–36)
CO2: 25 mmol/L (ref 20–29)
Calcium: 9 mg/dL (ref 8.7–10.3)
Chloride: 98 mmol/L (ref 96–106)
Creatinine, Ser: 1.26 mg/dL — ABNORMAL HIGH (ref 0.57–1.00)
GFR calc Af Amer: 42 mL/min/{1.73_m2} — ABNORMAL LOW (ref >59)
GFR calc non Af Amer: 37 mL/min/{1.73_m2} — ABNORMAL LOW (ref >59)
Glucose: 108 mg/dL — ABNORMAL HIGH (ref 65–99)
Potassium: 4.1 mmol/L (ref 3.5–5.2)
Sodium: 140 mmol/L (ref 134–144)

## 2019-11-01 LAB — CBC WITH DIFFERENTIAL/PLATELET
Basophils Absolute: 0 10*3/uL (ref 0.0–0.2)
Basos: 0 %
EOS (ABSOLUTE): 0.2 10*3/uL (ref 0.0–0.4)
Eos: 2 %
Hematocrit: 33.7 % — ABNORMAL LOW (ref 34.0–46.6)
Hemoglobin: 10.2 g/dL — ABNORMAL LOW (ref 11.1–15.9)
Immature Grans (Abs): 0 10*3/uL (ref 0.0–0.1)
Immature Granulocytes: 0 %
Lymphocytes Absolute: 1.3 10*3/uL (ref 0.7–3.1)
Lymphs: 11 %
MCH: 23.5 pg — ABNORMAL LOW (ref 26.6–33.0)
MCHC: 30.3 g/dL — ABNORMAL LOW (ref 31.5–35.7)
MCV: 78 fL — ABNORMAL LOW (ref 79–97)
Monocytes Absolute: 1 10*3/uL — ABNORMAL HIGH (ref 0.1–0.9)
Monocytes: 9 %
Neutrophils Absolute: 8.8 10*3/uL — ABNORMAL HIGH (ref 1.4–7.0)
Neutrophils: 78 %
Platelets: 445 10*3/uL (ref 150–450)
RBC: 4.34 x10E6/uL (ref 3.77–5.28)
RDW: 17.2 % — ABNORMAL HIGH (ref 11.7–15.4)
WBC: 11.4 10*3/uL — ABNORMAL HIGH (ref 3.4–10.8)

## 2019-11-01 LAB — MAGNESIUM: Magnesium: 2.3 mg/dL (ref 1.6–2.3)

## 2019-11-04 ENCOUNTER — Other Ambulatory Visit (HOSPITAL_COMMUNITY): Payer: Self-pay | Admitting: Internal Medicine

## 2019-11-04 DIAGNOSIS — I5042 Chronic combined systolic (congestive) and diastolic (congestive) heart failure: Secondary | ICD-10-CM | POA: Diagnosis not present

## 2019-11-04 DIAGNOSIS — D631 Anemia in chronic kidney disease: Secondary | ICD-10-CM | POA: Diagnosis not present

## 2019-11-04 DIAGNOSIS — I130 Hypertensive heart and chronic kidney disease with heart failure and stage 1 through stage 4 chronic kidney disease, or unspecified chronic kidney disease: Secondary | ICD-10-CM | POA: Diagnosis not present

## 2019-11-04 DIAGNOSIS — F0390 Unspecified dementia without behavioral disturbance: Secondary | ICD-10-CM | POA: Diagnosis not present

## 2019-11-04 DIAGNOSIS — I4819 Other persistent atrial fibrillation: Secondary | ICD-10-CM | POA: Diagnosis not present

## 2019-11-04 DIAGNOSIS — N184 Chronic kidney disease, stage 4 (severe): Secondary | ICD-10-CM | POA: Diagnosis not present

## 2019-11-08 DIAGNOSIS — I739 Peripheral vascular disease, unspecified: Secondary | ICD-10-CM | POA: Diagnosis not present

## 2019-11-08 DIAGNOSIS — J9690 Respiratory failure, unspecified, unspecified whether with hypoxia or hypercapnia: Secondary | ICD-10-CM | POA: Diagnosis not present

## 2019-11-08 DIAGNOSIS — D631 Anemia in chronic kidney disease: Secondary | ICD-10-CM | POA: Diagnosis not present

## 2019-11-08 DIAGNOSIS — I252 Old myocardial infarction: Secondary | ICD-10-CM | POA: Diagnosis not present

## 2019-11-08 DIAGNOSIS — Z7901 Long term (current) use of anticoagulants: Secondary | ICD-10-CM | POA: Diagnosis not present

## 2019-11-08 DIAGNOSIS — Z9181 History of falling: Secondary | ICD-10-CM | POA: Diagnosis not present

## 2019-11-08 DIAGNOSIS — I420 Dilated cardiomyopathy: Secondary | ICD-10-CM | POA: Diagnosis not present

## 2019-11-08 DIAGNOSIS — N184 Chronic kidney disease, stage 4 (severe): Secondary | ICD-10-CM | POA: Diagnosis not present

## 2019-11-08 DIAGNOSIS — Z96642 Presence of left artificial hip joint: Secondary | ICD-10-CM | POA: Diagnosis not present

## 2019-11-08 DIAGNOSIS — F419 Anxiety disorder, unspecified: Secondary | ICD-10-CM | POA: Diagnosis not present

## 2019-11-08 DIAGNOSIS — F0390 Unspecified dementia without behavioral disturbance: Secondary | ICD-10-CM | POA: Diagnosis not present

## 2019-11-08 DIAGNOSIS — I2510 Atherosclerotic heart disease of native coronary artery without angina pectoris: Secondary | ICD-10-CM | POA: Diagnosis not present

## 2019-11-08 DIAGNOSIS — Z452 Encounter for adjustment and management of vascular access device: Secondary | ICD-10-CM | POA: Diagnosis not present

## 2019-11-08 DIAGNOSIS — I130 Hypertensive heart and chronic kidney disease with heart failure and stage 1 through stage 4 chronic kidney disease, or unspecified chronic kidney disease: Secondary | ICD-10-CM | POA: Diagnosis not present

## 2019-11-08 DIAGNOSIS — E43 Unspecified severe protein-calorie malnutrition: Secondary | ICD-10-CM | POA: Diagnosis not present

## 2019-11-08 DIAGNOSIS — I4819 Other persistent atrial fibrillation: Secondary | ICD-10-CM | POA: Diagnosis not present

## 2019-11-08 DIAGNOSIS — E782 Mixed hyperlipidemia: Secondary | ICD-10-CM | POA: Diagnosis not present

## 2019-11-08 DIAGNOSIS — I5042 Chronic combined systolic (congestive) and diastolic (congestive) heart failure: Secondary | ICD-10-CM | POA: Diagnosis not present

## 2019-11-11 DIAGNOSIS — F0390 Unspecified dementia without behavioral disturbance: Secondary | ICD-10-CM | POA: Diagnosis not present

## 2019-11-11 DIAGNOSIS — D631 Anemia in chronic kidney disease: Secondary | ICD-10-CM | POA: Diagnosis not present

## 2019-11-11 DIAGNOSIS — I4819 Other persistent atrial fibrillation: Secondary | ICD-10-CM | POA: Diagnosis not present

## 2019-11-11 DIAGNOSIS — I5042 Chronic combined systolic (congestive) and diastolic (congestive) heart failure: Secondary | ICD-10-CM | POA: Diagnosis not present

## 2019-11-11 DIAGNOSIS — Z79899 Other long term (current) drug therapy: Secondary | ICD-10-CM | POA: Diagnosis not present

## 2019-11-11 DIAGNOSIS — I130 Hypertensive heart and chronic kidney disease with heart failure and stage 1 through stage 4 chronic kidney disease, or unspecified chronic kidney disease: Secondary | ICD-10-CM | POA: Diagnosis not present

## 2019-11-11 DIAGNOSIS — N184 Chronic kidney disease, stage 4 (severe): Secondary | ICD-10-CM | POA: Diagnosis not present

## 2019-11-18 DIAGNOSIS — I5042 Chronic combined systolic (congestive) and diastolic (congestive) heart failure: Secondary | ICD-10-CM | POA: Diagnosis not present

## 2019-11-18 DIAGNOSIS — I4819 Other persistent atrial fibrillation: Secondary | ICD-10-CM | POA: Diagnosis not present

## 2019-11-18 DIAGNOSIS — I130 Hypertensive heart and chronic kidney disease with heart failure and stage 1 through stage 4 chronic kidney disease, or unspecified chronic kidney disease: Secondary | ICD-10-CM | POA: Diagnosis not present

## 2019-11-18 DIAGNOSIS — N184 Chronic kidney disease, stage 4 (severe): Secondary | ICD-10-CM | POA: Diagnosis not present

## 2019-11-18 DIAGNOSIS — D631 Anemia in chronic kidney disease: Secondary | ICD-10-CM | POA: Diagnosis not present

## 2019-11-18 DIAGNOSIS — Z79899 Other long term (current) drug therapy: Secondary | ICD-10-CM | POA: Diagnosis not present

## 2019-11-18 DIAGNOSIS — F0390 Unspecified dementia without behavioral disturbance: Secondary | ICD-10-CM | POA: Diagnosis not present

## 2019-11-19 ENCOUNTER — Other Ambulatory Visit (HOSPITAL_COMMUNITY): Payer: Self-pay | Admitting: Internal Medicine

## 2019-11-25 DIAGNOSIS — I5042 Chronic combined systolic (congestive) and diastolic (congestive) heart failure: Secondary | ICD-10-CM | POA: Diagnosis not present

## 2019-11-25 DIAGNOSIS — I130 Hypertensive heart and chronic kidney disease with heart failure and stage 1 through stage 4 chronic kidney disease, or unspecified chronic kidney disease: Secondary | ICD-10-CM | POA: Diagnosis not present

## 2019-11-25 DIAGNOSIS — D631 Anemia in chronic kidney disease: Secondary | ICD-10-CM | POA: Diagnosis not present

## 2019-11-25 DIAGNOSIS — I4819 Other persistent atrial fibrillation: Secondary | ICD-10-CM | POA: Diagnosis not present

## 2019-11-25 DIAGNOSIS — Z79899 Other long term (current) drug therapy: Secondary | ICD-10-CM | POA: Diagnosis not present

## 2019-11-25 DIAGNOSIS — F0390 Unspecified dementia without behavioral disturbance: Secondary | ICD-10-CM | POA: Diagnosis not present

## 2019-11-25 DIAGNOSIS — N184 Chronic kidney disease, stage 4 (severe): Secondary | ICD-10-CM | POA: Diagnosis not present

## 2019-11-29 ENCOUNTER — Other Ambulatory Visit (HOSPITAL_COMMUNITY): Payer: Self-pay | Admitting: Internal Medicine

## 2019-11-30 ENCOUNTER — Other Ambulatory Visit: Payer: Self-pay

## 2019-11-30 ENCOUNTER — Encounter: Payer: Self-pay | Admitting: Podiatry

## 2019-11-30 ENCOUNTER — Ambulatory Visit (INDEPENDENT_AMBULATORY_CARE_PROVIDER_SITE_OTHER): Payer: Medicare Other | Admitting: Podiatry

## 2019-11-30 DIAGNOSIS — M79676 Pain in unspecified toe(s): Secondary | ICD-10-CM | POA: Diagnosis not present

## 2019-11-30 DIAGNOSIS — B351 Tinea unguium: Secondary | ICD-10-CM

## 2019-11-30 DIAGNOSIS — L84 Corns and callosities: Secondary | ICD-10-CM

## 2019-11-30 DIAGNOSIS — D689 Coagulation defect, unspecified: Secondary | ICD-10-CM

## 2019-11-30 NOTE — Progress Notes (Signed)
Complaint:  Visit Type: Patient returns to my office for continued preventative foot care services. Complaint: Patient states" my nails have grown long and thick and become painful to walk and wear shoes" Patient has been diagnosed with DM with no foot complications. The patient presents for preventative foot care services. No changes to ROS.  Patient is taking eliquiss.  Patient presents to the office with female caregiver.  Podiatric Exam: Vascular: dorsalis pedis and posterior tibial pulses are weakly  palpable bilateral. Capillary return is immediate. Temperature gradient is WNL. Skin turgor WNL  Sensorium: Normal Semmes Weinstein monofilament test. Normal tactile sensation bilaterally. Nail Exam: Pt has thick disfigured discolored nails with subungual debris noted bilateral entire nail hallux through fifth toenails Ulcer Exam: There is no evidence of ulcer or pre-ulcerative changes or infection. Orthopedic Exam: Muscle tone and strength are WNL. No limitations in general ROM. No crepitus or effusions noted. Foot type and digits show no abnormalities. HAV  B/L and tailors bunion  B/l. Skin: No Porokeratosis. No infection or ulcers.  Clavi 3rd toe right foot  Diagnosis:  Onychomycosis, , Pain in right toe, pain in left toes  Clavi 3rd right.  Treatment & Plan Procedures and Treatment: Consent by patient was obtained for treatment procedures.   Debridement of mycotic and hypertrophic toenails, 1 through 5 bilateral and clearing of subungual debris. No ulceration, no infection noted. Debride clavi. Return Visit-Office Procedure: Patient instructed to return to the office for a follow up visit 3 months for continued evaluation and treatment.    Gardiner Barefoot DPM

## 2019-12-02 DIAGNOSIS — Z0000 Encounter for general adult medical examination without abnormal findings: Secondary | ICD-10-CM | POA: Diagnosis not present

## 2019-12-02 DIAGNOSIS — I5042 Chronic combined systolic (congestive) and diastolic (congestive) heart failure: Secondary | ICD-10-CM | POA: Diagnosis not present

## 2019-12-02 DIAGNOSIS — Z1389 Encounter for screening for other disorder: Secondary | ICD-10-CM | POA: Diagnosis not present

## 2019-12-02 DIAGNOSIS — N184 Chronic kidney disease, stage 4 (severe): Secondary | ICD-10-CM | POA: Diagnosis not present

## 2019-12-02 DIAGNOSIS — D631 Anemia in chronic kidney disease: Secondary | ICD-10-CM | POA: Diagnosis not present

## 2019-12-02 DIAGNOSIS — I4819 Other persistent atrial fibrillation: Secondary | ICD-10-CM | POA: Diagnosis not present

## 2019-12-02 DIAGNOSIS — F0390 Unspecified dementia without behavioral disturbance: Secondary | ICD-10-CM | POA: Diagnosis not present

## 2019-12-02 DIAGNOSIS — I130 Hypertensive heart and chronic kidney disease with heart failure and stage 1 through stage 4 chronic kidney disease, or unspecified chronic kidney disease: Secondary | ICD-10-CM | POA: Diagnosis not present

## 2019-12-02 DIAGNOSIS — I5020 Unspecified systolic (congestive) heart failure: Secondary | ICD-10-CM | POA: Diagnosis not present

## 2019-12-03 DIAGNOSIS — H6123 Impacted cerumen, bilateral: Secondary | ICD-10-CM | POA: Diagnosis not present

## 2019-12-06 DIAGNOSIS — I5042 Chronic combined systolic (congestive) and diastolic (congestive) heart failure: Secondary | ICD-10-CM | POA: Diagnosis not present

## 2019-12-06 DIAGNOSIS — F0390 Unspecified dementia without behavioral disturbance: Secondary | ICD-10-CM | POA: Diagnosis not present

## 2019-12-06 DIAGNOSIS — I4819 Other persistent atrial fibrillation: Secondary | ICD-10-CM | POA: Diagnosis not present

## 2019-12-06 DIAGNOSIS — D631 Anemia in chronic kidney disease: Secondary | ICD-10-CM | POA: Diagnosis not present

## 2019-12-06 DIAGNOSIS — N184 Chronic kidney disease, stage 4 (severe): Secondary | ICD-10-CM | POA: Diagnosis not present

## 2019-12-06 DIAGNOSIS — I130 Hypertensive heart and chronic kidney disease with heart failure and stage 1 through stage 4 chronic kidney disease, or unspecified chronic kidney disease: Secondary | ICD-10-CM | POA: Diagnosis not present

## 2019-12-08 DIAGNOSIS — D631 Anemia in chronic kidney disease: Secondary | ICD-10-CM | POA: Diagnosis not present

## 2019-12-08 DIAGNOSIS — I252 Old myocardial infarction: Secondary | ICD-10-CM | POA: Diagnosis not present

## 2019-12-08 DIAGNOSIS — I4819 Other persistent atrial fibrillation: Secondary | ICD-10-CM | POA: Diagnosis not present

## 2019-12-08 DIAGNOSIS — I739 Peripheral vascular disease, unspecified: Secondary | ICD-10-CM | POA: Diagnosis not present

## 2019-12-08 DIAGNOSIS — J9690 Respiratory failure, unspecified, unspecified whether with hypoxia or hypercapnia: Secondary | ICD-10-CM | POA: Diagnosis not present

## 2019-12-08 DIAGNOSIS — Z452 Encounter for adjustment and management of vascular access device: Secondary | ICD-10-CM | POA: Diagnosis not present

## 2019-12-08 DIAGNOSIS — I2510 Atherosclerotic heart disease of native coronary artery without angina pectoris: Secondary | ICD-10-CM | POA: Diagnosis not present

## 2019-12-08 DIAGNOSIS — N184 Chronic kidney disease, stage 4 (severe): Secondary | ICD-10-CM | POA: Diagnosis not present

## 2019-12-08 DIAGNOSIS — F419 Anxiety disorder, unspecified: Secondary | ICD-10-CM | POA: Diagnosis not present

## 2019-12-08 DIAGNOSIS — F0390 Unspecified dementia without behavioral disturbance: Secondary | ICD-10-CM | POA: Diagnosis not present

## 2019-12-08 DIAGNOSIS — E782 Mixed hyperlipidemia: Secondary | ICD-10-CM | POA: Diagnosis not present

## 2019-12-08 DIAGNOSIS — Z7901 Long term (current) use of anticoagulants: Secondary | ICD-10-CM | POA: Diagnosis not present

## 2019-12-08 DIAGNOSIS — I130 Hypertensive heart and chronic kidney disease with heart failure and stage 1 through stage 4 chronic kidney disease, or unspecified chronic kidney disease: Secondary | ICD-10-CM | POA: Diagnosis not present

## 2019-12-08 DIAGNOSIS — M109 Gout, unspecified: Secondary | ICD-10-CM | POA: Diagnosis not present

## 2019-12-08 DIAGNOSIS — Z9181 History of falling: Secondary | ICD-10-CM | POA: Diagnosis not present

## 2019-12-08 DIAGNOSIS — Z96642 Presence of left artificial hip joint: Secondary | ICD-10-CM | POA: Diagnosis not present

## 2019-12-08 DIAGNOSIS — I5042 Chronic combined systolic (congestive) and diastolic (congestive) heart failure: Secondary | ICD-10-CM | POA: Diagnosis not present

## 2019-12-08 DIAGNOSIS — E43 Unspecified severe protein-calorie malnutrition: Secondary | ICD-10-CM | POA: Diagnosis not present

## 2019-12-08 DIAGNOSIS — I420 Dilated cardiomyopathy: Secondary | ICD-10-CM | POA: Diagnosis not present

## 2019-12-09 DIAGNOSIS — F0390 Unspecified dementia without behavioral disturbance: Secondary | ICD-10-CM | POA: Diagnosis not present

## 2019-12-09 DIAGNOSIS — D631 Anemia in chronic kidney disease: Secondary | ICD-10-CM | POA: Diagnosis not present

## 2019-12-09 DIAGNOSIS — I4819 Other persistent atrial fibrillation: Secondary | ICD-10-CM | POA: Diagnosis not present

## 2019-12-09 DIAGNOSIS — Z79899 Other long term (current) drug therapy: Secondary | ICD-10-CM | POA: Diagnosis not present

## 2019-12-09 DIAGNOSIS — I5042 Chronic combined systolic (congestive) and diastolic (congestive) heart failure: Secondary | ICD-10-CM | POA: Diagnosis not present

## 2019-12-09 DIAGNOSIS — N184 Chronic kidney disease, stage 4 (severe): Secondary | ICD-10-CM | POA: Diagnosis not present

## 2019-12-09 DIAGNOSIS — I130 Hypertensive heart and chronic kidney disease with heart failure and stage 1 through stage 4 chronic kidney disease, or unspecified chronic kidney disease: Secondary | ICD-10-CM | POA: Diagnosis not present

## 2019-12-14 ENCOUNTER — Telehealth (HOSPITAL_COMMUNITY): Payer: Self-pay | Admitting: Cardiology

## 2019-12-14 NOTE — Telephone Encounter (Signed)
[facility] at [phone]

## 2019-12-14 NOTE — Telephone Encounter (Signed)
Abnormal labs received from [facility] drawn 12/09/2019 RBC 3.42 Hg 8.0 HcT 25.5 BUN 34 Cr 1.34 K 4.8   Per Mare Loan, PA on eliquis-ask if melena,hematochezia Repeat cbc, iron,ferritin, and TIBC in one week  Spoke with patients caregiver Angela Nevin Denied coughing up blood, nose bleeds, blood in stool. Advised would recheck labs to further evaluate  Order sent to [facility] for additional labs

## 2019-12-15 DIAGNOSIS — M109 Gout, unspecified: Secondary | ICD-10-CM | POA: Diagnosis not present

## 2019-12-16 DIAGNOSIS — D631 Anemia in chronic kidney disease: Secondary | ICD-10-CM | POA: Diagnosis not present

## 2019-12-16 DIAGNOSIS — I130 Hypertensive heart and chronic kidney disease with heart failure and stage 1 through stage 4 chronic kidney disease, or unspecified chronic kidney disease: Secondary | ICD-10-CM | POA: Diagnosis not present

## 2019-12-16 DIAGNOSIS — I4819 Other persistent atrial fibrillation: Secondary | ICD-10-CM | POA: Diagnosis not present

## 2019-12-16 DIAGNOSIS — I5042 Chronic combined systolic (congestive) and diastolic (congestive) heart failure: Secondary | ICD-10-CM | POA: Diagnosis not present

## 2019-12-16 DIAGNOSIS — F0390 Unspecified dementia without behavioral disturbance: Secondary | ICD-10-CM | POA: Diagnosis not present

## 2019-12-16 DIAGNOSIS — N184 Chronic kidney disease, stage 4 (severe): Secondary | ICD-10-CM | POA: Diagnosis not present

## 2019-12-20 ENCOUNTER — Other Ambulatory Visit (HOSPITAL_COMMUNITY): Payer: Self-pay | Admitting: Internal Medicine

## 2019-12-21 DIAGNOSIS — I5020 Unspecified systolic (congestive) heart failure: Secondary | ICD-10-CM | POA: Diagnosis not present

## 2019-12-21 DIAGNOSIS — F0390 Unspecified dementia without behavioral disturbance: Secondary | ICD-10-CM | POA: Diagnosis not present

## 2019-12-21 DIAGNOSIS — I2510 Atherosclerotic heart disease of native coronary artery without angina pectoris: Secondary | ICD-10-CM | POA: Diagnosis not present

## 2019-12-21 DIAGNOSIS — I480 Paroxysmal atrial fibrillation: Secondary | ICD-10-CM | POA: Diagnosis not present

## 2019-12-23 DIAGNOSIS — F0390 Unspecified dementia without behavioral disturbance: Secondary | ICD-10-CM | POA: Diagnosis not present

## 2019-12-23 DIAGNOSIS — I4819 Other persistent atrial fibrillation: Secondary | ICD-10-CM | POA: Diagnosis not present

## 2019-12-23 DIAGNOSIS — I130 Hypertensive heart and chronic kidney disease with heart failure and stage 1 through stage 4 chronic kidney disease, or unspecified chronic kidney disease: Secondary | ICD-10-CM | POA: Diagnosis not present

## 2019-12-23 DIAGNOSIS — I5042 Chronic combined systolic (congestive) and diastolic (congestive) heart failure: Secondary | ICD-10-CM | POA: Diagnosis not present

## 2019-12-23 DIAGNOSIS — N184 Chronic kidney disease, stage 4 (severe): Secondary | ICD-10-CM | POA: Diagnosis not present

## 2019-12-23 DIAGNOSIS — D631 Anemia in chronic kidney disease: Secondary | ICD-10-CM | POA: Diagnosis not present

## 2019-12-27 ENCOUNTER — Other Ambulatory Visit: Payer: Medicare Other

## 2019-12-28 ENCOUNTER — Telehealth (HOSPITAL_COMMUNITY): Payer: Self-pay

## 2019-12-28 DIAGNOSIS — M1A9XX1 Chronic gout, unspecified, with tophus (tophi): Secondary | ICD-10-CM | POA: Diagnosis not present

## 2019-12-28 NOTE — Telephone Encounter (Signed)
Abnormal lab results received from [facility].  Labs drawn 12/23/19 HcT: 34.4 Platelets:343 Cr:1.68   Per [place] Simmons,PA have patient repeat bmp and bnp. Orders faxed to [facility] at [phone]  Patients caregiver Angela Nevin made aware as well

## 2019-12-30 ENCOUNTER — Other Ambulatory Visit (HOSPITAL_COMMUNITY): Payer: Self-pay | Admitting: Internal Medicine

## 2019-12-30 ENCOUNTER — Ambulatory Visit: Payer: Medicare Other | Attending: Internal Medicine

## 2019-12-30 ENCOUNTER — Telehealth (HOSPITAL_COMMUNITY): Payer: Self-pay

## 2019-12-30 DIAGNOSIS — I130 Hypertensive heart and chronic kidney disease with heart failure and stage 1 through stage 4 chronic kidney disease, or unspecified chronic kidney disease: Secondary | ICD-10-CM | POA: Diagnosis not present

## 2019-12-30 DIAGNOSIS — F0390 Unspecified dementia without behavioral disturbance: Secondary | ICD-10-CM | POA: Diagnosis not present

## 2019-12-30 DIAGNOSIS — I509 Heart failure, unspecified: Secondary | ICD-10-CM

## 2019-12-30 DIAGNOSIS — I5022 Chronic systolic (congestive) heart failure: Secondary | ICD-10-CM

## 2019-12-30 DIAGNOSIS — N184 Chronic kidney disease, stage 4 (severe): Secondary | ICD-10-CM | POA: Diagnosis not present

## 2019-12-30 DIAGNOSIS — I4819 Other persistent atrial fibrillation: Secondary | ICD-10-CM | POA: Diagnosis not present

## 2019-12-30 DIAGNOSIS — Z23 Encounter for immunization: Secondary | ICD-10-CM | POA: Insufficient documentation

## 2019-12-30 DIAGNOSIS — D631 Anemia in chronic kidney disease: Secondary | ICD-10-CM | POA: Diagnosis not present

## 2019-12-30 DIAGNOSIS — I5042 Chronic combined systolic (congestive) and diastolic (congestive) heart failure: Secondary | ICD-10-CM | POA: Diagnosis not present

## 2019-12-30 NOTE — Telephone Encounter (Signed)
[facility] called to report that patients left arm PICC line is retracted 4cm.  Pt and caregiver not sure when it happened. Per Lattie Haw, PICC line was retracted 2.5 cm for last few weeks. PICC line flushes well and has good blood return per RN. No pain.  D/w Dr Haroldine Laws, pt should have PICC exchanged. Spoke with interventional, unable to accommodate today.  Scheduled for tomorrow at 9:30A.  Made Lattie Haw aware of same.  She will d/w family as she is still in home.

## 2019-12-31 ENCOUNTER — Other Ambulatory Visit (HOSPITAL_COMMUNITY): Payer: Self-pay | Admitting: Internal Medicine

## 2019-12-31 ENCOUNTER — Other Ambulatory Visit: Payer: Self-pay

## 2019-12-31 ENCOUNTER — Ambulatory Visit (HOSPITAL_COMMUNITY)
Admission: RE | Admit: 2019-12-31 | Discharge: 2019-12-31 | Disposition: A | Discharge status: Home / Self Care | Payer: Medicare Other | Source: Ambulatory Visit | Attending: Internal Medicine | Admitting: Internal Medicine

## 2019-12-31 DIAGNOSIS — I509 Heart failure, unspecified: Secondary | ICD-10-CM

## 2019-12-31 DIAGNOSIS — T82528A Displacement of other cardiac and vascular devices and implants, initial encounter: Secondary | ICD-10-CM | POA: Diagnosis not present

## 2019-12-31 MED ORDER — CHLORHEXIDINE GLUCONATE 4 % EX LIQD
CUTANEOUS | Status: AC
  Filled 2019-12-31: qty 15

## 2019-12-31 MED ORDER — CHLORHEXIDINE GLUCONATE 4 % EX LIQD
CUTANEOUS | Status: DC | PRN
  Administered 2019-12-31: 1 via TOPICAL

## 2019-12-31 MED ORDER — LIDOCAINE HCL 1 % IJ SOLN
INTRAMUSCULAR | Status: AC
  Filled 2019-12-31: qty 20

## 2019-12-31 NOTE — Procedures (Signed)
  Procedure: L PICC exchange and reposition   EBL:   minimal Complications:  none immediate  See full dictation in [employer].  Dillard Cannon MD Main # [phone] Pager  [phone]

## 2020-01-06 DIAGNOSIS — N184 Chronic kidney disease, stage 4 (severe): Secondary | ICD-10-CM | POA: Diagnosis not present

## 2020-01-06 DIAGNOSIS — F0390 Unspecified dementia without behavioral disturbance: Secondary | ICD-10-CM | POA: Diagnosis not present

## 2020-01-06 DIAGNOSIS — D631 Anemia in chronic kidney disease: Secondary | ICD-10-CM | POA: Diagnosis not present

## 2020-01-06 DIAGNOSIS — I130 Hypertensive heart and chronic kidney disease with heart failure and stage 1 through stage 4 chronic kidney disease, or unspecified chronic kidney disease: Secondary | ICD-10-CM | POA: Diagnosis not present

## 2020-01-06 DIAGNOSIS — I4819 Other persistent atrial fibrillation: Secondary | ICD-10-CM | POA: Diagnosis not present

## 2020-01-06 DIAGNOSIS — I5042 Chronic combined systolic (congestive) and diastolic (congestive) heart failure: Secondary | ICD-10-CM | POA: Diagnosis not present

## 2020-01-07 ENCOUNTER — Ambulatory Visit (INDEPENDENT_AMBULATORY_CARE_PROVIDER_SITE_OTHER): Payer: Medicare Other | Admitting: Podiatry

## 2020-01-07 ENCOUNTER — Other Ambulatory Visit: Payer: Self-pay

## 2020-01-07 ENCOUNTER — Encounter: Payer: Self-pay | Admitting: Podiatry

## 2020-01-07 ENCOUNTER — Other Ambulatory Visit (HOSPITAL_COMMUNITY): Payer: Self-pay | Admitting: Internal Medicine

## 2020-01-07 ENCOUNTER — Ambulatory Visit (HOSPITAL_COMMUNITY)
Admission: RE | Admit: 2020-01-07 | Discharge: 2020-01-07 | Disposition: A | Discharge status: Home / Self Care | Payer: Medicare Other | Source: Ambulatory Visit | Attending: Internal Medicine | Admitting: Internal Medicine

## 2020-01-07 VITALS — Temp 97.3°F

## 2020-01-07 DIAGNOSIS — M2042 Other hammer toe(s) (acquired), left foot: Secondary | ICD-10-CM

## 2020-01-07 DIAGNOSIS — E782 Mixed hyperlipidemia: Secondary | ICD-10-CM | POA: Diagnosis not present

## 2020-01-07 DIAGNOSIS — D689 Coagulation defect, unspecified: Secondary | ICD-10-CM

## 2020-01-07 DIAGNOSIS — M2041 Other hammer toe(s) (acquired), right foot: Secondary | ICD-10-CM

## 2020-01-07 DIAGNOSIS — E43 Unspecified severe protein-calorie malnutrition: Secondary | ICD-10-CM | POA: Diagnosis not present

## 2020-01-07 DIAGNOSIS — I110 Hypertensive heart disease with heart failure: Secondary | ICD-10-CM

## 2020-01-07 DIAGNOSIS — F0390 Unspecified dementia without behavioral disturbance: Secondary | ICD-10-CM | POA: Diagnosis not present

## 2020-01-07 DIAGNOSIS — I350 Nonrheumatic aortic (valve) stenosis: Secondary | ICD-10-CM | POA: Diagnosis not present

## 2020-01-07 DIAGNOSIS — Z7982 Long term (current) use of aspirin: Secondary | ICD-10-CM

## 2020-01-07 DIAGNOSIS — I480 Paroxysmal atrial fibrillation: Secondary | ICD-10-CM

## 2020-01-07 DIAGNOSIS — I739 Peripheral vascular disease, unspecified: Secondary | ICD-10-CM | POA: Diagnosis not present

## 2020-01-07 DIAGNOSIS — I2510 Atherosclerotic heart disease of native coronary artery without angina pectoris: Secondary | ICD-10-CM

## 2020-01-07 DIAGNOSIS — Z452 Encounter for adjustment and management of vascular access device: Secondary | ICD-10-CM | POA: Diagnosis not present

## 2020-01-07 DIAGNOSIS — I252 Old myocardial infarction: Secondary | ICD-10-CM | POA: Diagnosis not present

## 2020-01-07 DIAGNOSIS — N184 Chronic kidney disease, stage 4 (severe): Secondary | ICD-10-CM | POA: Diagnosis not present

## 2020-01-07 DIAGNOSIS — J9690 Respiratory failure, unspecified, unspecified whether with hypoxia or hypercapnia: Secondary | ICD-10-CM | POA: Diagnosis not present

## 2020-01-07 DIAGNOSIS — Z955 Presence of coronary angioplasty implant and graft: Secondary | ICD-10-CM | POA: Diagnosis not present

## 2020-01-07 DIAGNOSIS — I420 Dilated cardiomyopathy: Secondary | ICD-10-CM | POA: Diagnosis not present

## 2020-01-07 DIAGNOSIS — I130 Hypertensive heart and chronic kidney disease with heart failure and stage 1 through stage 4 chronic kidney disease, or unspecified chronic kidney disease: Secondary | ICD-10-CM | POA: Diagnosis not present

## 2020-01-07 DIAGNOSIS — B351 Tinea unguium: Secondary | ICD-10-CM

## 2020-01-07 DIAGNOSIS — I5022 Chronic systolic (congestive) heart failure: Secondary | ICD-10-CM | POA: Diagnosis not present

## 2020-01-07 DIAGNOSIS — Z7901 Long term (current) use of anticoagulants: Secondary | ICD-10-CM

## 2020-01-07 DIAGNOSIS — Z9181 History of falling: Secondary | ICD-10-CM | POA: Diagnosis not present

## 2020-01-07 DIAGNOSIS — D631 Anemia in chronic kidney disease: Secondary | ICD-10-CM | POA: Diagnosis not present

## 2020-01-07 DIAGNOSIS — Z96642 Presence of left artificial hip joint: Secondary | ICD-10-CM | POA: Diagnosis not present

## 2020-01-07 DIAGNOSIS — M79676 Pain in unspecified toe(s): Secondary | ICD-10-CM

## 2020-01-07 DIAGNOSIS — I5042 Chronic combined systolic (congestive) and diastolic (congestive) heart failure: Secondary | ICD-10-CM | POA: Diagnosis not present

## 2020-01-07 DIAGNOSIS — I4819 Other persistent atrial fibrillation: Secondary | ICD-10-CM | POA: Diagnosis not present

## 2020-01-07 DIAGNOSIS — F419 Anxiety disorder, unspecified: Secondary | ICD-10-CM | POA: Diagnosis not present

## 2020-01-07 NOTE — Progress Notes (Signed)
Heart Failure TeleHealth Note  Due to national recommendations of social distancing due to [place] 19, Audio/video telehealth visit is felt to be most appropriate for this patient at this time.  See MyChart message from today for patient consent regarding telehealth for [facility].  Date:  01/07/2020   ID:  Jacqueline Reilly, DOB 1923/12/23, MRN [id number]  Location: Home  Provider location: [facility] Type of Visit: Established patient  PCP:  [name], MD  Cardiologist:  No primary care provider on file. Primary HF: Dajsha Massaro  Chief Complaint: Heart Failure follow-up   History of Present Illness:  Jacqueline Reilly is a 86 y.o. female with CAD, AAA, systolic HF, mild dementia, afib and CAD. She has had two previous coronary interventions including a cutting balloon to her D2 in 2007 and a DES to her mid LCX in 2010. Her LV dysfunction has been out of proportion to her CAD.  Last echo in 8/18 showed EF 25-30% (on milrinone) with severe LV dilation.  Admitted 7/11-7/24/15 with syncope and dyspnea. She had new onset A-fib/RVR. Troponin was 1.03> 1.59> 2.18 . She was placed on amiodarone and heparin drip. She developed respiratory distress and required intubation. She converted to NSR but later was bradycardiac with NSVT. Amiodarone temporarily stopped but restarted. Co-ox 29% and started on milrinone which we tried to wean off but she did not tolerate. Discharge weight 87 lbs.   Echo 8/16  EF 25% with diffuse hypokinesis, mild AS/mild AI, RV mildly dilated with moderately decreased systolic function.  Echo 2/18 EF 25-30% Echo 8/18 EF 25-30%  Remains on milrinone.   She presents via [employer], [employer] (consulting) for a telehealth visit today. Her caretaker is with her to facilitate the call. She remains on milrinone. She recently had her PICC line replaced for dislodgement after dislodged by [facility]. Her caretaker says she is doing great. Says no problems with  SOB or CP. Says her only problem is her dementia is getting worse. Labs have been stable   [place] denies symptoms worrisome for COVID 19.   Past Medical History:  Diagnosis Date  . AAA (abdominal aortic aneurysm) ([place])   . Atrial fibrillation ([place])   . Chronic systolic heart failure ([place])    a. ECHO (04/2014) EF 20-25%, diff HK, mild MR  . Coronary artery disease 2007   moderate ASCAD of the left system s/p PCI of the D2 and PCI of the left circ 03/2009  . Hyperlipidemia   . Hypertension   . PVD (peripheral vascular disease) ([place])   . Renal artery stenosis ([place])   . Ventricular dysfunction    left; ischemic   Past Surgical History:  Procedure Laterality Date  . ANGIOPLASTY    . ANTERIOR APPROACH HEMI HIP ARTHROPLASTY Left 03/05/2017   Procedure: ANTERIOR APPROACH LEFT HIP HEMI ARTHROPLASTY;  Surgeon: Leandrew Koyanagi, MD;  Location: [facility];  Service: Orthopedics;  Laterality: Left;  . CORONARY STENT PLACEMENT    . IR GENERIC HISTORICAL  09/25/2016   IR FLUORO GUIDE CV LINE RIGHT 09/25/2016 Ardis Rowan, PA-C [facility]  . retinal cryopexy     right eye (for retinal detachment)     Current Outpatient Medications  Medication Sig Dispense Refill  . acetaminophen (TYLENOL) 500 MG tablet Take 1,000 mg by mouth 3 (three) times daily.    Marland Kitchen allopurinol (ZYLOPRIM) 100 MG tablet Take 100 mg by mouth daily.    Marland Kitchen apixaban (ELIQUIS) 2.5 MG  TABS tablet Take 1 tablet (2.5 mg total) by mouth 2 (two) times daily. 180 tablet 3  . busPIRone (BUSPAR) 5 MG tablet Take 5 mg by mouth daily.     . cefUROXime (CEFTIN) 500 MG tablet Take 1 tablet (500 mg total) by mouth daily. 8 tablet 0  . cephALEXin (KEFLEX) 500 MG capsule Take 500 mg by mouth 2 (two) times daily.    . colchicine 0.6 MG tablet Take 0.6 mg by mouth daily.    Marland Kitchen lisinopril (PRINIVIL,ZESTRIL) 2.5 MG tablet Take 2.5 mg by mouth daily.  1  . milrinone (PRIMACOR) 20 MG/100 ML SOLN infusion Inject 0.125 mcg/kg/min into the vein  continuous. Rate 1.6 ml/hr Weight= 43 kg    . Multiple Vitamin (MULTIVITAMIN) tablet Take 1 tablet by mouth daily. 30 tablet 1  . OLANZapine (ZYPREXA) 2.5 MG tablet     . Omega-3 Fatty Acids (FISH OIL) 1000 MG CAPS Take 1 capsule by mouth 2 (two) times daily.     Marland Kitchen PACERONE 200 MG tablet 1/2 TABLET ONCE A DAY 45 tablet 1  . predniSONE (DELTASONE) 10 MG tablet     . torsemide (DEMADEX) 20 MG tablet Take 2 tablets (40 mg total) by mouth 2 (two) times daily. 60 tablet 0   No current facility-administered medications for this encounter.    Allergies:   Codeine and Garlic   Social History:  The patient  reports that she has never smoked. She has never used smokeless tobacco. She reports current alcohol use. She reports that she does not use drugs.   Family History:  The patient's family history includes Heart disease in her brother, father, and mother.   ROS:  Please see the history of present illness.   All other systems are personally reviewed and negative.   Vitals stable  Exam:  (Video/Tele Health Call; Exam is subjective and or/visual.) General:  Elderly confused No resp difficulty. Lungs: Normal respiratory effort with conversation.  Abdomen: soft NT/ND per caretaker report Extremities: no edema Neuro: Alert confused  Recent Labs: 10/28/2019: BUN 23; Creatinine, Ser 1.26; Hemoglobin 10.2; Magnesium 2.3; Platelets 445; Potassium 4.1; Sodium 140  Personally reviewed   Wt Readings from Last 3 Encounters:  12/18/18 50.7 kg (111 lb 12.8 oz)  08/17/18 48.4 kg (106 lb 12.8 oz)  01/10/18 44 kg (97 lb)      ASSESSMENT AND PLAN:  1) Chronic Systolic Heart Failure: Mixed ischemic/nonischemic cardiomyopathy (degree of LV dysfunction is out of proportion to CAD), EF 25% with moderate RV systolic dysfunction .  Echo  8/18 EF 25-30% Mild AS - Previously longstanding NYHA IV.  - Now on milrinone with stable NYHA II- III symptoms  - Volume ok. Weight stable at 110 - Continue torsemide  40 mg BID - Continue lisinopril 2.5 mg daily  - Continue home milrinone 0.125 mcg/kg/min for palliative purposes. Has failed weans in the past.  - Continue HH aide and AHC support. Recent labs ok. 2) Atrial fibrillation:  - Paroxysmal.   - HR regular on HF cuff - Denies bleeding on apixaban 2.5 mg BID. (weight less than 60 kg and age >65).  - Continue amiodarone 100 mg daily.   3) Limited Code: No CPR or defibrillation 4) CAD:  - No s/s of ischemia - Off ASA with Eliquis. Intolerant of statins in past.  5) Dementia; - Progressive 6) Aortic stenosis - Mild by echo 8/18. Follow.    COVID screen The patient does not have any symptoms that suggest any  further testing/ screening at this time.  Social distancing reinforced today.  Recommended follow-up:  6 months   Relevant cardiac medications were reviewed at length with the patient today.   The patient does not have concerns regarding their medications at this time.   The following changes were made today:  As above  Today, I have spent 12 minutes with the patient and her caretaker with telehealth technology discussing the above issues .    Signed, Glori Bickers, MD  01/07/2020 1:10 PM  Advanced [facility] Failure [place] [id number] [phone] (office) [phone] (fax)

## 2020-01-10 ENCOUNTER — Encounter: Payer: Self-pay | Admitting: Podiatry

## 2020-01-10 DIAGNOSIS — R410 Disorientation, unspecified: Secondary | ICD-10-CM | POA: Diagnosis not present

## 2020-01-10 DIAGNOSIS — R1084 Generalized abdominal pain: Secondary | ICD-10-CM | POA: Diagnosis not present

## 2020-01-10 DIAGNOSIS — N3001 Acute cystitis with hematuria: Secondary | ICD-10-CM | POA: Diagnosis not present

## 2020-01-10 NOTE — Progress Notes (Signed)
  Subjective:  Patient ID: Jacqueline Reilly, female    DOB: 1924/06/10,  MRN: [id number]  Chief Complaint  Patient presents with  . Nail Problem    "toes hurt due to curved nails into skin; big toe on R is worse, diggs into 2nd toe due to overlapping"   86 y.o. female returns for the above complaint.  Patient presents with thickened elongated mycotic toenails x10.  Patient states that they have been painful to walk on.  She would like to know if there is anything that could be done to debride them down.  She also states that the second toe is digging into the overlapping first toe because of how far along the nails are.  She is worried that it may cause ulceration.  She denies any other acute complaints.  She is known to Dr. Prudence Davidson who usually treats her.  Objective:   Vitals:   01/07/20 1116  Temp: (!) 97.3 F (36.3 C)   Podiatric Exam: Vascular: dorsalis pedis and posterior tibial pulses are palpable bilateral. Capillary return is immediate. Temperature gradient is WNL. Skin turgor WNL  Sensorium: Normal Semmes Weinstein monofilament test. Normal tactile sensation bilaterally. Nail Exam: Pt has thick disfigured discolored nails with subungual debris noted bilateral entire nail hallux through fifth toenails.  Hammertoe contracture bilaterally noted Ulcer Exam: There is no evidence of ulcer or pre-ulcerative changes or infection. Orthopedic Exam: Muscle tone and strength are WNL. No limitations in general ROM. No crepitus or effusions noted. HAV  B/L.  Hammer toes 2-5  B/L. Skin: No Porokeratosis. No infection or ulcers  Assessment & Plan:  Patient was evaluated and treated and all questions answered.  Hammertoe contracture -I explained to the patient the etiology of hammertoe contracture and its relationship of why the thickened elongated no code can be digging into the skin.  This will lead to progressive pain.  Unfortunately there is not much that can be done given that this is a rigid  deformity also surgery however given her age patient is not an ideal candidate candidate for surgery.  Patient states understanding and would just like to debride the toenails down.  Onychomycosis with pain  -Nails palliatively debrided as below. -Educated on self-care  Procedure: Nail Debridement Rationale: pain  Type of Debridement: manual, sharp debridement. Instrumentation: Nail nipper, rotary burr. Number of Nails: 10  Procedures and Treatment: Consent by patient was obtained for treatment procedures. The patient understood the discussion of treatment and procedures well. All questions were answered thoroughly reviewed. Debridement of mycotic and hypertrophic toenails, 1 through 5 bilateral and clearing of subungual debris. No ulceration, no infection noted.  Return Visit-Office Procedure: Patient instructed to return to the office for a follow up visit 3 months for continued evaluation and treatment.  Boneta Lucks, DPM    Return in about 4 months (around 05/08/2020) for f/u with Dr. Prudence Davidson nails.

## 2020-01-13 DIAGNOSIS — D631 Anemia in chronic kidney disease: Secondary | ICD-10-CM | POA: Diagnosis not present

## 2020-01-13 DIAGNOSIS — N184 Chronic kidney disease, stage 4 (severe): Secondary | ICD-10-CM | POA: Diagnosis not present

## 2020-01-13 DIAGNOSIS — I5042 Chronic combined systolic (congestive) and diastolic (congestive) heart failure: Secondary | ICD-10-CM | POA: Diagnosis not present

## 2020-01-13 DIAGNOSIS — I130 Hypertensive heart and chronic kidney disease with heart failure and stage 1 through stage 4 chronic kidney disease, or unspecified chronic kidney disease: Secondary | ICD-10-CM | POA: Diagnosis not present

## 2020-01-13 DIAGNOSIS — I4819 Other persistent atrial fibrillation: Secondary | ICD-10-CM | POA: Diagnosis not present

## 2020-01-13 DIAGNOSIS — F0390 Unspecified dementia without behavioral disturbance: Secondary | ICD-10-CM | POA: Diagnosis not present

## 2020-01-17 DIAGNOSIS — M1A9XX1 Chronic gout, unspecified, with tophus (tophi): Secondary | ICD-10-CM | POA: Diagnosis not present

## 2020-01-20 ENCOUNTER — Telehealth (HOSPITAL_COMMUNITY): Payer: Self-pay | Admitting: Cardiology

## 2020-01-20 ENCOUNTER — Other Ambulatory Visit (HOSPITAL_COMMUNITY): Payer: Self-pay | Admitting: Internal Medicine

## 2020-01-20 DIAGNOSIS — N184 Chronic kidney disease, stage 4 (severe): Secondary | ICD-10-CM | POA: Diagnosis not present

## 2020-01-20 DIAGNOSIS — I130 Hypertensive heart and chronic kidney disease with heart failure and stage 1 through stage 4 chronic kidney disease, or unspecified chronic kidney disease: Secondary | ICD-10-CM | POA: Diagnosis not present

## 2020-01-20 DIAGNOSIS — D631 Anemia in chronic kidney disease: Secondary | ICD-10-CM | POA: Diagnosis not present

## 2020-01-20 DIAGNOSIS — I5042 Chronic combined systolic (congestive) and diastolic (congestive) heart failure: Secondary | ICD-10-CM | POA: Diagnosis not present

## 2020-01-20 DIAGNOSIS — I4819 Other persistent atrial fibrillation: Secondary | ICD-10-CM | POA: Diagnosis not present

## 2020-01-20 DIAGNOSIS — F0390 Unspecified dementia without behavioral disturbance: Secondary | ICD-10-CM | POA: Diagnosis not present

## 2020-01-20 NOTE — Telephone Encounter (Signed)
Abnormal labs received from [place] drawn 01/13/20 K 4.2 Cr 2.00 BUN 33  Per VO Amy Clegg,NP Stop lisinopril and hold torsemide x days and restart torsemide at 40 mg BID  Pt aware via caregiver Angela Nevin [phone]

## 2020-01-24 ENCOUNTER — Other Ambulatory Visit (HOSPITAL_COMMUNITY): Payer: Self-pay | Admitting: Internal Medicine

## 2020-01-25 ENCOUNTER — Ambulatory Visit: Payer: Medicare Other | Attending: Internal Medicine

## 2020-01-25 DIAGNOSIS — Z23 Encounter for immunization: Secondary | ICD-10-CM

## 2020-01-27 DIAGNOSIS — D631 Anemia in chronic kidney disease: Secondary | ICD-10-CM | POA: Diagnosis not present

## 2020-01-27 DIAGNOSIS — I4819 Other persistent atrial fibrillation: Secondary | ICD-10-CM | POA: Diagnosis not present

## 2020-01-27 DIAGNOSIS — I5042 Chronic combined systolic (congestive) and diastolic (congestive) heart failure: Secondary | ICD-10-CM | POA: Diagnosis not present

## 2020-01-27 DIAGNOSIS — N184 Chronic kidney disease, stage 4 (severe): Secondary | ICD-10-CM | POA: Diagnosis not present

## 2020-01-27 DIAGNOSIS — Z79899 Other long term (current) drug therapy: Secondary | ICD-10-CM | POA: Diagnosis not present

## 2020-01-27 DIAGNOSIS — F0390 Unspecified dementia without behavioral disturbance: Secondary | ICD-10-CM | POA: Diagnosis not present

## 2020-01-27 DIAGNOSIS — I130 Hypertensive heart and chronic kidney disease with heart failure and stage 1 through stage 4 chronic kidney disease, or unspecified chronic kidney disease: Secondary | ICD-10-CM | POA: Diagnosis not present

## 2020-01-31 ENCOUNTER — Other Ambulatory Visit (HOSPITAL_COMMUNITY): Payer: Self-pay | Admitting: Internal Medicine

## 2020-02-02 DIAGNOSIS — M1A9XX1 Chronic gout, unspecified, with tophus (tophi): Secondary | ICD-10-CM | POA: Diagnosis not present

## 2020-02-03 DIAGNOSIS — F0390 Unspecified dementia without behavioral disturbance: Secondary | ICD-10-CM | POA: Diagnosis not present

## 2020-02-03 DIAGNOSIS — I5042 Chronic combined systolic (congestive) and diastolic (congestive) heart failure: Secondary | ICD-10-CM | POA: Diagnosis not present

## 2020-02-03 DIAGNOSIS — N184 Chronic kidney disease, stage 4 (severe): Secondary | ICD-10-CM | POA: Diagnosis not present

## 2020-02-03 DIAGNOSIS — D631 Anemia in chronic kidney disease: Secondary | ICD-10-CM | POA: Diagnosis not present

## 2020-02-03 DIAGNOSIS — I4819 Other persistent atrial fibrillation: Secondary | ICD-10-CM | POA: Diagnosis not present

## 2020-02-03 DIAGNOSIS — I130 Hypertensive heart and chronic kidney disease with heart failure and stage 1 through stage 4 chronic kidney disease, or unspecified chronic kidney disease: Secondary | ICD-10-CM | POA: Diagnosis not present

## 2020-02-06 DIAGNOSIS — Z96642 Presence of left artificial hip joint: Secondary | ICD-10-CM | POA: Diagnosis not present

## 2020-02-06 DIAGNOSIS — E43 Unspecified severe protein-calorie malnutrition: Secondary | ICD-10-CM | POA: Diagnosis not present

## 2020-02-06 DIAGNOSIS — D631 Anemia in chronic kidney disease: Secondary | ICD-10-CM | POA: Diagnosis not present

## 2020-02-06 DIAGNOSIS — N184 Chronic kidney disease, stage 4 (severe): Secondary | ICD-10-CM | POA: Diagnosis not present

## 2020-02-06 DIAGNOSIS — I4819 Other persistent atrial fibrillation: Secondary | ICD-10-CM | POA: Diagnosis not present

## 2020-02-06 DIAGNOSIS — Z452 Encounter for adjustment and management of vascular access device: Secondary | ICD-10-CM | POA: Diagnosis not present

## 2020-02-06 DIAGNOSIS — I420 Dilated cardiomyopathy: Secondary | ICD-10-CM | POA: Diagnosis not present

## 2020-02-06 DIAGNOSIS — I252 Old myocardial infarction: Secondary | ICD-10-CM | POA: Diagnosis not present

## 2020-02-06 DIAGNOSIS — E782 Mixed hyperlipidemia: Secondary | ICD-10-CM | POA: Diagnosis not present

## 2020-02-06 DIAGNOSIS — Z9181 History of falling: Secondary | ICD-10-CM | POA: Diagnosis not present

## 2020-02-06 DIAGNOSIS — I2510 Atherosclerotic heart disease of native coronary artery without angina pectoris: Secondary | ICD-10-CM | POA: Diagnosis not present

## 2020-02-06 DIAGNOSIS — J9690 Respiratory failure, unspecified, unspecified whether with hypoxia or hypercapnia: Secondary | ICD-10-CM | POA: Diagnosis not present

## 2020-02-06 DIAGNOSIS — I739 Peripheral vascular disease, unspecified: Secondary | ICD-10-CM | POA: Diagnosis not present

## 2020-02-06 DIAGNOSIS — F419 Anxiety disorder, unspecified: Secondary | ICD-10-CM | POA: Diagnosis not present

## 2020-02-06 DIAGNOSIS — I5042 Chronic combined systolic (congestive) and diastolic (congestive) heart failure: Secondary | ICD-10-CM | POA: Diagnosis not present

## 2020-02-06 DIAGNOSIS — Z7901 Long term (current) use of anticoagulants: Secondary | ICD-10-CM | POA: Diagnosis not present

## 2020-02-06 DIAGNOSIS — F0390 Unspecified dementia without behavioral disturbance: Secondary | ICD-10-CM | POA: Diagnosis not present

## 2020-02-06 DIAGNOSIS — I130 Hypertensive heart and chronic kidney disease with heart failure and stage 1 through stage 4 chronic kidney disease, or unspecified chronic kidney disease: Secondary | ICD-10-CM | POA: Diagnosis not present

## 2020-02-06 DIAGNOSIS — Z79899 Other long term (current) drug therapy: Secondary | ICD-10-CM | POA: Diagnosis not present

## 2020-02-07 ENCOUNTER — Other Ambulatory Visit (HOSPITAL_COMMUNITY): Payer: Self-pay | Admitting: Internal Medicine

## 2020-02-08 ENCOUNTER — Ambulatory Visit (HOSPITAL_COMMUNITY)
Admission: RE | Admit: 2020-02-08 | Discharge: 2020-02-08 | Disposition: A | Discharge status: Home / Self Care | Payer: Medicare Other | Source: Ambulatory Visit | Attending: Internal Medicine | Admitting: Internal Medicine

## 2020-02-08 ENCOUNTER — Other Ambulatory Visit (HOSPITAL_COMMUNITY): Payer: Self-pay | Admitting: Internal Medicine

## 2020-02-08 ENCOUNTER — Other Ambulatory Visit (HOSPITAL_COMMUNITY): Payer: Self-pay

## 2020-02-08 ENCOUNTER — Other Ambulatory Visit: Payer: Self-pay

## 2020-02-08 ENCOUNTER — Telehealth (HOSPITAL_COMMUNITY): Payer: Self-pay | Admitting: *Deleted

## 2020-02-08 DIAGNOSIS — Z452 Encounter for adjustment and management of vascular access device: Secondary | ICD-10-CM | POA: Diagnosis not present

## 2020-02-08 DIAGNOSIS — Z79899 Other long term (current) drug therapy: Secondary | ICD-10-CM | POA: Diagnosis not present

## 2020-02-08 DIAGNOSIS — I5022 Chronic systolic (congestive) heart failure: Secondary | ICD-10-CM

## 2020-02-08 MED ORDER — LIDOCAINE HCL 1 % IJ SOLN
INTRAMUSCULAR | Status: AC
  Filled 2020-02-08: qty 20

## 2020-02-08 MED ORDER — LIDOCAINE HCL 1 % IJ SOLN
INTRAMUSCULAR | Status: DC | PRN
  Administered 2020-02-08: 5 mL

## 2020-02-08 NOTE — Telephone Encounter (Signed)
Patients niece called stating pts caregiver told her pts PICC line was completely pulled out. They arent sure how long PICC has been out. They think it was pulled out sometime during her sleep last night or early this morning. Per Adline Potter contact interventional radiology and have them replace the line. IR scheduled pt for 1pm today. Niece and caregiver aware.

## 2020-02-08 NOTE — Procedures (Signed)
PROCEDURE SUMMARY:  Successful placement of image-guided single lumen PICC line to the left brachial vein. Length 41 cm. Tip at lower SVC/RA. No complications. EBL = 5 mL. Ready for use.  Please see imaging section of Epic for full dictation.   Claris Pong Cailan Antonucci PA-C 02/08/2020 3:09 PM

## 2020-02-10 DIAGNOSIS — D631 Anemia in chronic kidney disease: Secondary | ICD-10-CM | POA: Diagnosis not present

## 2020-02-10 DIAGNOSIS — I5042 Chronic combined systolic (congestive) and diastolic (congestive) heart failure: Secondary | ICD-10-CM | POA: Diagnosis not present

## 2020-02-10 DIAGNOSIS — F0390 Unspecified dementia without behavioral disturbance: Secondary | ICD-10-CM | POA: Diagnosis not present

## 2020-02-10 DIAGNOSIS — N184 Chronic kidney disease, stage 4 (severe): Secondary | ICD-10-CM | POA: Diagnosis not present

## 2020-02-10 DIAGNOSIS — I130 Hypertensive heart and chronic kidney disease with heart failure and stage 1 through stage 4 chronic kidney disease, or unspecified chronic kidney disease: Secondary | ICD-10-CM | POA: Diagnosis not present

## 2020-02-10 DIAGNOSIS — I4819 Other persistent atrial fibrillation: Secondary | ICD-10-CM | POA: Diagnosis not present

## 2020-02-17 ENCOUNTER — Other Ambulatory Visit (HOSPITAL_COMMUNITY): Payer: Self-pay | Admitting: Internal Medicine

## 2020-02-17 DIAGNOSIS — N184 Chronic kidney disease, stage 4 (severe): Secondary | ICD-10-CM | POA: Diagnosis not present

## 2020-02-17 DIAGNOSIS — I130 Hypertensive heart and chronic kidney disease with heart failure and stage 1 through stage 4 chronic kidney disease, or unspecified chronic kidney disease: Secondary | ICD-10-CM | POA: Diagnosis not present

## 2020-02-17 DIAGNOSIS — D631 Anemia in chronic kidney disease: Secondary | ICD-10-CM | POA: Diagnosis not present

## 2020-02-17 DIAGNOSIS — F0390 Unspecified dementia without behavioral disturbance: Secondary | ICD-10-CM | POA: Diagnosis not present

## 2020-02-17 DIAGNOSIS — I4819 Other persistent atrial fibrillation: Secondary | ICD-10-CM | POA: Diagnosis not present

## 2020-02-17 DIAGNOSIS — I5042 Chronic combined systolic (congestive) and diastolic (congestive) heart failure: Secondary | ICD-10-CM | POA: Diagnosis not present

## 2020-02-18 ENCOUNTER — Other Ambulatory Visit (HOSPITAL_COMMUNITY): Payer: Self-pay | Admitting: Internal Medicine

## 2020-02-24 DIAGNOSIS — N184 Chronic kidney disease, stage 4 (severe): Secondary | ICD-10-CM | POA: Diagnosis not present

## 2020-02-24 DIAGNOSIS — D631 Anemia in chronic kidney disease: Secondary | ICD-10-CM | POA: Diagnosis not present

## 2020-02-24 DIAGNOSIS — I5042 Chronic combined systolic (congestive) and diastolic (congestive) heart failure: Secondary | ICD-10-CM | POA: Diagnosis not present

## 2020-02-24 DIAGNOSIS — I130 Hypertensive heart and chronic kidney disease with heart failure and stage 1 through stage 4 chronic kidney disease, or unspecified chronic kidney disease: Secondary | ICD-10-CM | POA: Diagnosis not present

## 2020-02-24 DIAGNOSIS — F0390 Unspecified dementia without behavioral disturbance: Secondary | ICD-10-CM | POA: Diagnosis not present

## 2020-02-24 DIAGNOSIS — I4819 Other persistent atrial fibrillation: Secondary | ICD-10-CM | POA: Diagnosis not present

## 2020-02-29 ENCOUNTER — Encounter: Payer: Self-pay | Admitting: Podiatry

## 2020-02-29 ENCOUNTER — Ambulatory Visit (INDEPENDENT_AMBULATORY_CARE_PROVIDER_SITE_OTHER): Payer: Medicare Other | Admitting: Podiatry

## 2020-02-29 ENCOUNTER — Other Ambulatory Visit: Payer: Self-pay

## 2020-02-29 VITALS — Temp 97.5°F

## 2020-02-29 DIAGNOSIS — D689 Coagulation defect, unspecified: Secondary | ICD-10-CM

## 2020-02-29 DIAGNOSIS — M79676 Pain in unspecified toe(s): Secondary | ICD-10-CM

## 2020-02-29 DIAGNOSIS — L84 Corns and callosities: Secondary | ICD-10-CM | POA: Diagnosis not present

## 2020-02-29 DIAGNOSIS — B351 Tinea unguium: Secondary | ICD-10-CM | POA: Diagnosis not present

## 2020-02-29 NOTE — Progress Notes (Signed)
This patient returns to my office for at risk foot care.  This patient requires this care by a professional since this patient will be at risk due to having chronic kidney disease and coaguation defect.  Patient is taking eliquiss.  This patient is unable to cut nails herself since the patient cannot reach her nails.These nails are painful walking and wearing shoes.  This patient presents for at risk foot care today. Painful corn fourth toe right foot.  General Appearance  Alert, conversant and in no acute stress.  Vascular  Dorsalis pedis and posterior tibial  pulses are palpable  bilaterally.  Capillary return is within normal limits  bilaterally. Temperature is within normal limits  bilaterally.  Neurologic  Senn-Weinstein monofilament wire test within normal limits  bilaterally. Muscle power within normal limits bilaterally.  Nails Thick disfigured discolored nails with subungual debris  from hallux to fifth toes bilaterally. No evidence of bacterial infection or drainage bilaterally.  Orthopedic  No limitations of motion  feet .  No crepitus or effusions noted.  No bony pathology or digital deformities noted.  Skin  normotropic skin with no porokeratosis noted bilaterally.  No signs of infections or ulcers noted.   Clavi fourth toe right.  Onychomycosis  Pain in right toes  Pain in left toes  Consent was obtained for treatment procedures.   Mechanical debridement of nails 1-5  bilaterally performed with a nail nipper.  Filed with dremel without incident.    Return office visit   10 weeks                   Told patient to return for periodic foot care and evaluation due to potential at risk complications.   Gardiner Barefoot DPM

## 2020-03-02 ENCOUNTER — Other Ambulatory Visit (HOSPITAL_COMMUNITY): Payer: Self-pay | Admitting: Internal Medicine

## 2020-03-02 DIAGNOSIS — D631 Anemia in chronic kidney disease: Secondary | ICD-10-CM | POA: Diagnosis not present

## 2020-03-02 DIAGNOSIS — I5042 Chronic combined systolic (congestive) and diastolic (congestive) heart failure: Secondary | ICD-10-CM | POA: Diagnosis not present

## 2020-03-02 DIAGNOSIS — I4819 Other persistent atrial fibrillation: Secondary | ICD-10-CM | POA: Diagnosis not present

## 2020-03-02 DIAGNOSIS — I130 Hypertensive heart and chronic kidney disease with heart failure and stage 1 through stage 4 chronic kidney disease, or unspecified chronic kidney disease: Secondary | ICD-10-CM | POA: Diagnosis not present

## 2020-03-02 DIAGNOSIS — F0390 Unspecified dementia without behavioral disturbance: Secondary | ICD-10-CM | POA: Diagnosis not present

## 2020-03-02 DIAGNOSIS — N184 Chronic kidney disease, stage 4 (severe): Secondary | ICD-10-CM | POA: Diagnosis not present

## 2020-03-03 ENCOUNTER — Telehealth (HOSPITAL_COMMUNITY): Payer: Self-pay | Admitting: *Deleted

## 2020-03-03 DIAGNOSIS — I5022 Chronic systolic (congestive) heart failure: Secondary | ICD-10-CM

## 2020-03-03 DIAGNOSIS — F0390 Unspecified dementia without behavioral disturbance: Secondary | ICD-10-CM | POA: Diagnosis not present

## 2020-03-03 DIAGNOSIS — I480 Paroxysmal atrial fibrillation: Secondary | ICD-10-CM | POA: Diagnosis not present

## 2020-03-03 DIAGNOSIS — I5023 Acute on chronic systolic (congestive) heart failure: Secondary | ICD-10-CM | POA: Diagnosis not present

## 2020-03-03 DIAGNOSIS — I5020 Unspecified systolic (congestive) heart failure: Secondary | ICD-10-CM | POA: Diagnosis not present

## 2020-03-03 DIAGNOSIS — I2510 Atherosclerotic heart disease of native coronary artery without angina pectoris: Secondary | ICD-10-CM | POA: Diagnosis not present

## 2020-03-03 NOTE — Telephone Encounter (Signed)
Pt scheduled Monday at [place] w/[facility] aware and I called the caregiver Angela Nevin to advise her to pt at [facility] Monday at 12:45pm. Both aware and agreeable with plan.

## 2020-03-03 NOTE — Telephone Encounter (Signed)
Discussion w/Pam Tamera Punt, RN w/[facility], she recommends pt try a tunneled picc to see if this will work better for her, order placed will arrange

## 2020-03-03 NOTE — Telephone Encounter (Signed)
Received a call at the end of the day yesterday from [facility] with [facility]. Pt picc line was sutured in but yesterday sutures appeared to be pulled away from the skin. Picc line hub to insertion site was out last week about 1.2cm yesterday it was out 1.8cm. She also noticed extra tape around picc dressing and dried blood under the tegaderm. Per Lattie Haw pts pump infusing and pt was receiving milrinone.  Per Adline Potter follow up today with Dr.Bensimhon and Carolynn Sayers. Picc replaced 02/08/2020 (patient pulled her picc out) and  12/31/2019 (line was retracted 4cm caregiver not sure what happened).

## 2020-03-06 ENCOUNTER — Other Ambulatory Visit (HOSPITAL_COMMUNITY): Payer: Self-pay | Admitting: Internal Medicine

## 2020-03-06 ENCOUNTER — Ambulatory Visit (HOSPITAL_COMMUNITY)
Admission: RE | Admit: 2020-03-06 | Discharge: 2020-03-06 | Disposition: A | Discharge status: Home / Self Care | Payer: Medicare Other | Source: Ambulatory Visit | Attending: Internal Medicine | Admitting: Internal Medicine

## 2020-03-06 DIAGNOSIS — T82598A Other mechanical complication of other cardiac and vascular devices and implants, initial encounter: Secondary | ICD-10-CM | POA: Diagnosis not present

## 2020-03-06 DIAGNOSIS — I5022 Chronic systolic (congestive) heart failure: Secondary | ICD-10-CM

## 2020-03-06 DIAGNOSIS — Z452 Encounter for adjustment and management of vascular access device: Secondary | ICD-10-CM | POA: Diagnosis not present

## 2020-03-06 HISTORY — PX: IR US GUIDE VASC ACCESS LEFT: IMG2389

## 2020-03-06 HISTORY — PX: IR VENO/EXT/UNI RIGHT: IMG676

## 2020-03-06 HISTORY — PX: IR FLUORO GUIDE CV LINE LEFT: IMG2282

## 2020-03-06 HISTORY — PX: IR CV LINE INJECTION: IMG2294

## 2020-03-06 HISTORY — PX: IR US GUIDE VASC ACCESS RIGHT: IMG2390

## 2020-03-06 MED ORDER — LIDOCAINE HCL 1 % IJ SOLN
INTRAMUSCULAR | Status: AC
  Filled 2020-03-06: qty 20

## 2020-03-06 MED ORDER — LIDOCAINE HCL 1 % IJ SOLN
INTRAMUSCULAR | Status: DC | PRN
  Administered 2020-03-06: 10 mL

## 2020-03-06 MED ORDER — CHLORHEXIDINE GLUCONATE 4 % EX LIQD
CUTANEOUS | Status: AC
  Filled 2020-03-06: qty 15

## 2020-03-06 MED ORDER — HEPARIN SOD (PORK) LOCK FLUSH 100 UNIT/ML IV SOLN
INTRAVENOUS | Status: AC
  Filled 2020-03-06: qty 5

## 2020-03-06 MED ORDER — IOHEXOL 300 MG/ML  SOLN: 50.0000 mL | Freq: Once | INTRAMUSCULAR | Status: DC | PRN

## 2020-03-06 NOTE — Progress Notes (Addendum)
Nephew refusing to consent for tunneled PICC line at this time. Patient will be have a PICC exchange performed.   Addendum Peripheral access unattainable. Spoke to [employer] who is agreeable to  Central tunneled access at this time. Please dictation from Dr. Vernia Buff for further documentation.

## 2020-03-06 NOTE — Procedures (Signed)
Interventional Radiology Procedure Note  Procedure: LT IJ TUNNELED PICC  Complications: None  Estimated Blood Loss: MIN  Findings: UNSUCCESSFUL PICC ATTEMPTS BILATERALLY VENOGRAMS CONFIRM SUBLAVIAN VENOUS OCCLUSIONS BILATERAL  SUCCESSFUL LT IJ TUNNELED SL PICC TIP [place]

## 2020-03-07 DIAGNOSIS — I739 Peripheral vascular disease, unspecified: Secondary | ICD-10-CM | POA: Diagnosis not present

## 2020-03-07 DIAGNOSIS — I2510 Atherosclerotic heart disease of native coronary artery without angina pectoris: Secondary | ICD-10-CM | POA: Diagnosis not present

## 2020-03-07 DIAGNOSIS — I4819 Other persistent atrial fibrillation: Secondary | ICD-10-CM | POA: Diagnosis not present

## 2020-03-07 DIAGNOSIS — E43 Unspecified severe protein-calorie malnutrition: Secondary | ICD-10-CM | POA: Diagnosis not present

## 2020-03-07 DIAGNOSIS — M1A9XX1 Chronic gout, unspecified, with tophus (tophi): Secondary | ICD-10-CM | POA: Diagnosis not present

## 2020-03-07 DIAGNOSIS — D631 Anemia in chronic kidney disease: Secondary | ICD-10-CM | POA: Diagnosis not present

## 2020-03-07 DIAGNOSIS — Z452 Encounter for adjustment and management of vascular access device: Secondary | ICD-10-CM | POA: Diagnosis not present

## 2020-03-07 DIAGNOSIS — I420 Dilated cardiomyopathy: Secondary | ICD-10-CM | POA: Diagnosis not present

## 2020-03-07 DIAGNOSIS — I252 Old myocardial infarction: Secondary | ICD-10-CM | POA: Diagnosis not present

## 2020-03-07 DIAGNOSIS — E782 Mixed hyperlipidemia: Secondary | ICD-10-CM | POA: Diagnosis not present

## 2020-03-07 DIAGNOSIS — F419 Anxiety disorder, unspecified: Secondary | ICD-10-CM | POA: Diagnosis not present

## 2020-03-07 DIAGNOSIS — Z79899 Other long term (current) drug therapy: Secondary | ICD-10-CM | POA: Diagnosis not present

## 2020-03-07 DIAGNOSIS — Z96642 Presence of left artificial hip joint: Secondary | ICD-10-CM | POA: Diagnosis not present

## 2020-03-07 DIAGNOSIS — J9690 Respiratory failure, unspecified, unspecified whether with hypoxia or hypercapnia: Secondary | ICD-10-CM | POA: Diagnosis not present

## 2020-03-07 DIAGNOSIS — N184 Chronic kidney disease, stage 4 (severe): Secondary | ICD-10-CM | POA: Diagnosis not present

## 2020-03-07 DIAGNOSIS — I130 Hypertensive heart and chronic kidney disease with heart failure and stage 1 through stage 4 chronic kidney disease, or unspecified chronic kidney disease: Secondary | ICD-10-CM | POA: Diagnosis not present

## 2020-03-07 DIAGNOSIS — Z7901 Long term (current) use of anticoagulants: Secondary | ICD-10-CM | POA: Diagnosis not present

## 2020-03-07 DIAGNOSIS — Z9181 History of falling: Secondary | ICD-10-CM | POA: Diagnosis not present

## 2020-03-07 DIAGNOSIS — I5042 Chronic combined systolic (congestive) and diastolic (congestive) heart failure: Secondary | ICD-10-CM | POA: Diagnosis not present

## 2020-03-07 DIAGNOSIS — F0390 Unspecified dementia without behavioral disturbance: Secondary | ICD-10-CM | POA: Diagnosis not present

## 2020-03-09 DIAGNOSIS — I130 Hypertensive heart and chronic kidney disease with heart failure and stage 1 through stage 4 chronic kidney disease, or unspecified chronic kidney disease: Secondary | ICD-10-CM | POA: Diagnosis not present

## 2020-03-09 DIAGNOSIS — I5042 Chronic combined systolic (congestive) and diastolic (congestive) heart failure: Secondary | ICD-10-CM | POA: Diagnosis not present

## 2020-03-09 DIAGNOSIS — I4819 Other persistent atrial fibrillation: Secondary | ICD-10-CM | POA: Diagnosis not present

## 2020-03-09 DIAGNOSIS — F0390 Unspecified dementia without behavioral disturbance: Secondary | ICD-10-CM | POA: Diagnosis not present

## 2020-03-09 DIAGNOSIS — D631 Anemia in chronic kidney disease: Secondary | ICD-10-CM | POA: Diagnosis not present

## 2020-03-09 DIAGNOSIS — N184 Chronic kidney disease, stage 4 (severe): Secondary | ICD-10-CM | POA: Diagnosis not present

## 2020-03-14 ENCOUNTER — Other Ambulatory Visit (HOSPITAL_COMMUNITY): Payer: Self-pay | Admitting: Internal Medicine

## 2020-03-15 ENCOUNTER — Encounter (HOSPITAL_COMMUNITY): Payer: Self-pay

## 2020-03-15 ENCOUNTER — Other Ambulatory Visit (HOSPITAL_COMMUNITY): Payer: Self-pay | Admitting: Internal Medicine

## 2020-03-15 DIAGNOSIS — I5022 Chronic systolic (congestive) heart failure: Secondary | ICD-10-CM

## 2020-03-16 DIAGNOSIS — I130 Hypertensive heart and chronic kidney disease with heart failure and stage 1 through stage 4 chronic kidney disease, or unspecified chronic kidney disease: Secondary | ICD-10-CM | POA: Diagnosis not present

## 2020-03-16 DIAGNOSIS — D631 Anemia in chronic kidney disease: Secondary | ICD-10-CM | POA: Diagnosis not present

## 2020-03-16 DIAGNOSIS — N184 Chronic kidney disease, stage 4 (severe): Secondary | ICD-10-CM | POA: Diagnosis not present

## 2020-03-16 DIAGNOSIS — I4819 Other persistent atrial fibrillation: Secondary | ICD-10-CM | POA: Diagnosis not present

## 2020-03-16 DIAGNOSIS — F0390 Unspecified dementia without behavioral disturbance: Secondary | ICD-10-CM | POA: Diagnosis not present

## 2020-03-16 DIAGNOSIS — I5042 Chronic combined systolic (congestive) and diastolic (congestive) heart failure: Secondary | ICD-10-CM | POA: Diagnosis not present

## 2020-03-17 ENCOUNTER — Telehealth (HOSPITAL_COMMUNITY): Payer: Self-pay | Admitting: *Deleted

## 2020-03-17 NOTE — Telephone Encounter (Signed)
pts niece left VM requesting we contact home health and ask them to only use [name] as a nurse for pt. Per Adline Potter we can't tell home health to only send [name] for home visits. I left detailed VM on nieces phone.

## 2020-03-21 ENCOUNTER — Telehealth (HOSPITAL_COMMUNITY): Payer: Self-pay | Admitting: *Deleted

## 2020-03-21 NOTE — Telephone Encounter (Addendum)
[facility]- Spoke with Oris Drone from [employer] she read the RNs note that stated pt would not be picked up for service at this time because she doesn't show any CHF symptoms. I told her pt needed home health for picc care and milrinone. Berniece told me she would have someone clinical call me after 1pm today so that I can explain the need for home health services.  [facility]-  Benjamine Mola returned my call for her director Janett Billow she explained to me that pts niece no longer wanted Lattie Haw (current [facility]) to see patient and that she requested [name]. [name] no longer services this area she is in [place] and the pts home is an hour outside of the area she services. I was told the niece continued to leave voice messages on Ashas phone asking her to please see pt. Elizabeth asked Rudi Rummage if this was manageable and she told her she could only see pt prn but could not commit to being her full time nurse. Pts niece Stanton Kidney is aware of that. Pts niece wants Rudi Rummage because of picc care issues in the past where patient has pulled out her picc line. Picc line was pulled out before [facility] would come out to patients home. Benjamine Mola asked Stanton Kidney if she was aware the picc was pulled before Lattie Haw came to patients home Stanton Kidney told her no that is not what was told to her by caregiver.  Pt has a caregiver named Angela Nevin because Stanton Kidney (niece) lives out of state. Jessica IT trainer) offered to refer pt to [facility] if Stanton Kidney was not satisfied with the care being provided by [facility]. [facility] will continue to service patient until [facility] picks up patient. Per Bo Mcclintock will follow up with [facility] today.

## 2020-03-21 NOTE — Telephone Encounter (Signed)
Received a call from pts niece Jacqueline Reilly that she needed help with getting a nurse out to manage pts milrinone and picc line. She said pt had [facility] but [facility] referred [facility] to [facility] hospice but pt did not qualify for hospice or [facility].  I asked Jacqueline Reilly why they were referring her to another agency and she said Jacqueline Reilly of [facility]  told her there was nothing else [facility] could do for the pt the current nurse felt like she was being watched and she did not want a law suit. The pts family had contacted [facility] and requested [name],RN to be patients only [facility] because pt never had an issue with her picc line with Jacqueline Reilly. Pt has recently had to have picc line replaced multiple times for pulling it out. Niece said she doesn't think pt is pulling it out but that the nurses are not securing the picc correctly.[facility] told Jacqueline Reilly they could not assign pt to [facility] due to staffing. Jacqueline Reilly already has too many patients. Jacqueline Reilly said  Jacqueline Reilly then told her she referred pt to [facility] hospice. Jacqueline Reilly (pts niece) spoke with Jacqueline Reilly with [facility] and was told pt did not qualify for hospice or their home health program. I asked Jacqueline Reilly why pt did not qualify she said she did not know but needs someone to manage pts milrinone. I asked for Jacqueline Reilly and Jacqueline Reilly numbers to get more information and told Jacqueline Reilly I would call her after speaking with both agencies. I called Jacqueline Reilly at [phone] she did not answer I left a detailed message requesting return call. I called Jacqueline Reilly [phone] and was told she was on another call I left my call back number and pts information for a return call.

## 2020-03-22 NOTE — Telephone Encounter (Signed)
Spoke with pts niece Aura Camps and informed her [facility] would continue to service pt. MAry was concerned there wouldn't be a nurse out to see pt tomorrow. Per Benjamine Mola with [facility] there is a nurse assigned to see pt tomorrow and they would never fully discharge a patient without another agency picking pt up. Stanton Kidney thanked me for the call.

## 2020-03-23 DIAGNOSIS — N184 Chronic kidney disease, stage 4 (severe): Secondary | ICD-10-CM | POA: Diagnosis not present

## 2020-03-23 DIAGNOSIS — I4819 Other persistent atrial fibrillation: Secondary | ICD-10-CM | POA: Diagnosis not present

## 2020-03-23 DIAGNOSIS — I509 Heart failure, unspecified: Secondary | ICD-10-CM | POA: Diagnosis not present

## 2020-03-23 DIAGNOSIS — I5042 Chronic combined systolic (congestive) and diastolic (congestive) heart failure: Secondary | ICD-10-CM | POA: Diagnosis not present

## 2020-03-23 DIAGNOSIS — F0390 Unspecified dementia without behavioral disturbance: Secondary | ICD-10-CM | POA: Diagnosis not present

## 2020-03-23 DIAGNOSIS — I130 Hypertensive heart and chronic kidney disease with heart failure and stage 1 through stage 4 chronic kidney disease, or unspecified chronic kidney disease: Secondary | ICD-10-CM | POA: Diagnosis not present

## 2020-03-23 DIAGNOSIS — D631 Anemia in chronic kidney disease: Secondary | ICD-10-CM | POA: Diagnosis not present

## 2020-03-29 ENCOUNTER — Other Ambulatory Visit (HOSPITAL_COMMUNITY): Payer: Self-pay | Admitting: Internal Medicine

## 2020-03-30 ENCOUNTER — Ambulatory Visit
Admission: RE | Admit: 2020-03-30 | Discharge: 2020-03-30 | Disposition: A | Discharge status: Home / Self Care | Payer: Medicare Other | Source: Ambulatory Visit | Attending: Physician Assistant | Admitting: Physician Assistant

## 2020-03-30 ENCOUNTER — Other Ambulatory Visit: Payer: Self-pay | Admitting: Physician Assistant

## 2020-03-30 ENCOUNTER — Telehealth (HOSPITAL_COMMUNITY): Payer: Self-pay | Admitting: Vascular Surgery

## 2020-03-30 DIAGNOSIS — F0390 Unspecified dementia without behavioral disturbance: Secondary | ICD-10-CM | POA: Diagnosis not present

## 2020-03-30 DIAGNOSIS — D631 Anemia in chronic kidney disease: Secondary | ICD-10-CM | POA: Diagnosis not present

## 2020-03-30 DIAGNOSIS — R0989 Other specified symptoms and signs involving the circulatory and respiratory systems: Secondary | ICD-10-CM | POA: Diagnosis not present

## 2020-03-30 DIAGNOSIS — N184 Chronic kidney disease, stage 4 (severe): Secondary | ICD-10-CM | POA: Diagnosis not present

## 2020-03-30 DIAGNOSIS — I130 Hypertensive heart and chronic kidney disease with heart failure and stage 1 through stage 4 chronic kidney disease, or unspecified chronic kidney disease: Secondary | ICD-10-CM | POA: Diagnosis not present

## 2020-03-30 DIAGNOSIS — I5020 Unspecified systolic (congestive) heart failure: Secondary | ICD-10-CM | POA: Diagnosis not present

## 2020-03-30 DIAGNOSIS — I5023 Acute on chronic systolic (congestive) heart failure: Secondary | ICD-10-CM | POA: Diagnosis not present

## 2020-03-30 DIAGNOSIS — I5042 Chronic combined systolic (congestive) and diastolic (congestive) heart failure: Secondary | ICD-10-CM | POA: Diagnosis not present

## 2020-03-30 DIAGNOSIS — I4819 Other persistent atrial fibrillation: Secondary | ICD-10-CM | POA: Diagnosis not present

## 2020-03-30 NOTE — Telephone Encounter (Signed)
Pt niece called wanting to make ASAP appt today, pt O2, has been down in 44s.. please advise

## 2020-03-30 NOTE — Telephone Encounter (Signed)
Called pt niece Aura Camps she said caregiver thinks pt has a UTI as well. O2 in 70s without oxygen but is back in the 90s with supplemental O2. Pt has an appt with her pcp today at 11am.  I will forward that office note to our NP when I receive it.

## 2020-04-03 DIAGNOSIS — Z5181 Encounter for therapeutic drug level monitoring: Secondary | ICD-10-CM | POA: Diagnosis not present

## 2020-04-03 DIAGNOSIS — I5023 Acute on chronic systolic (congestive) heart failure: Secondary | ICD-10-CM | POA: Diagnosis not present

## 2020-04-04 DIAGNOSIS — D631 Anemia in chronic kidney disease: Secondary | ICD-10-CM | POA: Diagnosis not present

## 2020-04-04 DIAGNOSIS — I4819 Other persistent atrial fibrillation: Secondary | ICD-10-CM | POA: Diagnosis not present

## 2020-04-04 DIAGNOSIS — I130 Hypertensive heart and chronic kidney disease with heart failure and stage 1 through stage 4 chronic kidney disease, or unspecified chronic kidney disease: Secondary | ICD-10-CM | POA: Diagnosis not present

## 2020-04-04 DIAGNOSIS — F0390 Unspecified dementia without behavioral disturbance: Secondary | ICD-10-CM | POA: Diagnosis not present

## 2020-04-04 DIAGNOSIS — N184 Chronic kidney disease, stage 4 (severe): Secondary | ICD-10-CM | POA: Diagnosis not present

## 2020-04-04 DIAGNOSIS — I5042 Chronic combined systolic (congestive) and diastolic (congestive) heart failure: Secondary | ICD-10-CM | POA: Diagnosis not present

## 2020-04-06 DIAGNOSIS — I509 Heart failure, unspecified: Secondary | ICD-10-CM | POA: Diagnosis not present

## 2020-04-06 DIAGNOSIS — I130 Hypertensive heart and chronic kidney disease with heart failure and stage 1 through stage 4 chronic kidney disease, or unspecified chronic kidney disease: Secondary | ICD-10-CM | POA: Diagnosis not present

## 2020-04-07 ENCOUNTER — Telehealth (HOSPITAL_COMMUNITY): Payer: Self-pay

## 2020-04-07 NOTE — Telephone Encounter (Signed)
Patients niece, Stanton Kidney called to request that Anaria be seen in person next week instead of virtual. She thinks patient needs to be seen in person since patient was placed on oxygen due to the patients O2 stats dropping into the 70s. However she did state that last night patient was able to sleep without oxygen and seems to be doing a lot better then she was earlier in the week. I advised her to keep close watch on patient this weekend and patients virtual visit was changed to a in person appointment with Dr. Haroldine Laws by his nurse, Kevan Rosebush. Patients niece Stanton Kidney was very appreciative

## 2020-04-10 DIAGNOSIS — Z5181 Encounter for therapeutic drug level monitoring: Secondary | ICD-10-CM | POA: Diagnosis not present

## 2020-04-11 ENCOUNTER — Telehealth (HOSPITAL_COMMUNITY): Payer: Self-pay | Admitting: Cardiology

## 2020-04-11 NOTE — Telephone Encounter (Signed)
Abnormal labs received from [place] drawn 04/06/20 Cr 2.35 BUN 59 Na 136 K 4.5   Per Amy Clegg,NP Hold torsemide today-be sure to keep followup on 04/12/20   Pt aware via caregiver Angela Nevin, reports patient was advised to hold torsemide x 2 days starting 04/11/20 for dehydration. Will be sure to have patient at follow up 04/12/20

## 2020-04-12 ENCOUNTER — Encounter (HOSPITAL_COMMUNITY): Payer: Self-pay | Admitting: Internal Medicine

## 2020-04-12 ENCOUNTER — Other Ambulatory Visit: Payer: Self-pay

## 2020-04-12 ENCOUNTER — Ambulatory Visit (HOSPITAL_COMMUNITY)
Admission: RE | Admit: 2020-04-12 | Discharge: 2020-04-12 | Disposition: A | Discharge status: Home / Self Care | Payer: Medicare Other | Source: Ambulatory Visit | Attending: Internal Medicine | Admitting: Internal Medicine

## 2020-04-12 ENCOUNTER — Other Ambulatory Visit (HOSPITAL_COMMUNITY): Payer: Self-pay | Admitting: Internal Medicine

## 2020-04-12 VITALS — BP 86/52 | HR 78 | Wt 101.2 lb

## 2020-04-12 DIAGNOSIS — I2510 Atherosclerotic heart disease of native coronary artery without angina pectoris: Secondary | ICD-10-CM | POA: Insufficient documentation

## 2020-04-12 DIAGNOSIS — F0390 Unspecified dementia without behavioral disturbance: Secondary | ICD-10-CM | POA: Insufficient documentation

## 2020-04-12 DIAGNOSIS — E785 Hyperlipidemia, unspecified: Secondary | ICD-10-CM | POA: Insufficient documentation

## 2020-04-12 DIAGNOSIS — Z79899 Other long term (current) drug therapy: Secondary | ICD-10-CM | POA: Diagnosis not present

## 2020-04-12 DIAGNOSIS — Z885 Allergy status to narcotic agent status: Secondary | ICD-10-CM | POA: Diagnosis not present

## 2020-04-12 DIAGNOSIS — I480 Paroxysmal atrial fibrillation: Secondary | ICD-10-CM

## 2020-04-12 DIAGNOSIS — I5022 Chronic systolic (congestive) heart failure: Secondary | ICD-10-CM | POA: Diagnosis not present

## 2020-04-12 DIAGNOSIS — I110 Hypertensive heart disease with heart failure: Secondary | ICD-10-CM | POA: Diagnosis not present

## 2020-04-12 DIAGNOSIS — I739 Peripheral vascular disease, unspecified: Secondary | ICD-10-CM | POA: Insufficient documentation

## 2020-04-12 DIAGNOSIS — Z7901 Long term (current) use of anticoagulants: Secondary | ICD-10-CM | POA: Diagnosis not present

## 2020-04-12 DIAGNOSIS — I714 Abdominal aortic aneurysm, without rupture: Secondary | ICD-10-CM | POA: Insufficient documentation

## 2020-04-12 DIAGNOSIS — Z96642 Presence of left artificial hip joint: Secondary | ICD-10-CM | POA: Diagnosis not present

## 2020-04-12 DIAGNOSIS — Z8249 Family history of ischemic heart disease and other diseases of the circulatory system: Secondary | ICD-10-CM | POA: Insufficient documentation

## 2020-04-12 DIAGNOSIS — I350 Nonrheumatic aortic (valve) stenosis: Secondary | ICD-10-CM | POA: Insufficient documentation

## 2020-04-12 DIAGNOSIS — I428 Other cardiomyopathies: Secondary | ICD-10-CM | POA: Insufficient documentation

## 2020-04-12 DIAGNOSIS — Z955 Presence of coronary angioplasty implant and graft: Secondary | ICD-10-CM | POA: Insufficient documentation

## 2020-04-12 DIAGNOSIS — I5020 Unspecified systolic (congestive) heart failure: Secondary | ICD-10-CM | POA: Diagnosis present

## 2020-04-12 MED ORDER — POTASSIUM CHLORIDE CRYS ER 20 MEQ PO TBCR
20.0000 meq | EXTENDED_RELEASE_TABLET | ORAL | 3 refills | Status: DC
Start: 2020-04-12 — End: 2020-05-10

## 2020-04-12 MED ORDER — METOLAZONE 2.5 MG PO TABS
2.5000 mg | ORAL_TABLET | ORAL | 3 refills | Status: DC
Start: 2020-04-12 — End: 2020-05-10

## 2020-04-12 NOTE — Progress Notes (Signed)
Advanced Heart Failure Clinic Note   Date:  04/12/2020   ID:  Jacqueline Reilly, DOB [birth year], MRN [id number]  Location: Home  Provider location: [facility] Type of Visit: Established patient  PCP:  [name], MD  Cardiologist:  No primary care provider on file. Primary HF: [place]  Chief Complaint: Heart Failure follow-up   History of Present Illness:  Jacqueline Reilly is a 86 y.o. female with CAD, AAA, systolic HF, mild dementia, afib and CAD. She has had two previous coronary interventions including a cutting balloon to her D2 in 2007 and a DES to her mid LCX in 2010. Her LV dysfunction has been out of proportion to her CAD.  Last echo in 8/18 showed EF 25-30% (on milrinone) with severe LV dilation.  Admitted 7/11-7/24/15 with syncope and dyspnea. She had new onset A-fib/RVR. Troponin was 1.03> 1.59> 2.18 . She was placed on amiodarone and heparin drip. She developed respiratory distress and required intubation. She converted to NSR but later was bradycardiac with NSVT. Amiodarone temporarily stopped but restarted. Co-ox 29% and started on milrinone which we tried to wean off but she did not tolerate. Discharge weight 87 lbs.   Echo 8/16  EF 25% with diffuse hypokinesis, mild AS/mild AI, RV mildly dilated with moderately decreased systolic function.  Echo 2/18 EF 25-30% Echo 8/18 EF 25-30%  Remains on milrinone. Unfortunately she has dislodged her PICC line multiple times and has had to come to ER to have it replaced each month for the last 3 months)  She is here for f/u. Jacqueline Reilly cannot provide me with much reliable history. Says she is SOB at times. Her home caretaker states that the PICC is getting pulled out by [facility] when she pulls the bandage off. They have now switched the nurse. However HHRN reportedly said Jacqueline Reilly is dislodging it. Recently fluid was up and Dr. Laurann Montana treated her with metolazone for 7 days and she developed AKI. Now  holding torsemide for 2 days.   Past Medical History:  Diagnosis Date   AAA (abdominal aortic aneurysm) ([place])    Atrial fibrillation ([place])    Chronic systolic heart failure ([place])    a. ECHO (04/2014) EF 20-25%, diff HK, mild MR   Coronary artery disease 2007   moderate ASCAD of the left system s/p PCI of the D2 and PCI of the left circ 03/2009   Hyperlipidemia    Hypertension    PVD (peripheral vascular disease) ([place])    Renal artery stenosis ([facility])    Ventricular dysfunction    left; ischemic   Past Surgical History:  Procedure Laterality Date   ANGIOPLASTY     ANTERIOR APPROACH HEMI HIP ARTHROPLASTY Left 03/05/2017   Procedure: ANTERIOR APPROACH LEFT HIP HEMI ARTHROPLASTY;  Surgeon: Leandrew Koyanagi, MD;  Location: [facility];  Service: Orthopedics;  Laterality: Left;   CORONARY STENT PLACEMENT     IR CV LINE INJECTION  03/06/2020   IR FLUORO GUIDE CV LINE LEFT  03/06/2020   IR GENERIC HISTORICAL  09/25/2016   IR FLUORO GUIDE CV LINE RIGHT 09/25/2016 Ardis Rowan, PA-C [facility]   IR US GUIDE VASC ACCESS LEFT  03/06/2020   IR US GUIDE VASC ACCESS RIGHT  03/06/2020   retinal cryopexy     right eye (for retinal detachment)     Current Outpatient Medications  Medication Sig Dispense Refill   acetaminophen (TYLENOL) 500 MG tablet Take 1,000 mg by  mouth 3 (three) times daily.     allopurinol (ZYLOPRIM) 100 MG tablet Take 100 mg by mouth daily.     apixaban (ELIQUIS) 2.5 MG TABS tablet Take 1 tablet (2.5 mg total) by mouth 2 (two) times daily. 180 tablet 3   busPIRone (BUSPAR) 5 MG tablet Take 5 mg by mouth daily.      colchicine 0.6 MG tablet Take 0.6 mg by mouth daily.     milrinone (PRIMACOR) 20 MG/100 ML SOLN infusion Inject 0.125 mcg/kg/min into the vein continuous. Rate 1.6 ml/hr Weight= 43 kg     Multiple Vitamin (MULTIVITAMIN) tablet Take 1 tablet by mouth daily. 30 tablet 1   OLANZapine (ZYPREXA) 2.5 MG tablet      Omega-3 Fatty Acids (FISH OIL)  1000 MG CAPS Take 1 capsule by mouth 2 (two) times daily.      PACERONE 200 MG tablet 1/2 TABLET ONCE A DAY 45 tablet 1   torsemide (DEMADEX) 20 MG tablet Take 2 tablets (40 mg total) by mouth 2 (two) times daily. 60 tablet 0   No current facility-administered medications for this encounter.    Allergies:   Codeine and Garlic   Social History:  The patient  reports that she has never smoked. She has never used smokeless tobacco. She reports current alcohol use. She reports that she does not use drugs.   Family History:  The patient's family history includes Heart disease in her brother, father, and mother.   ROS:  Please see the history of present illness.   All other systems are personally reviewed and negative.   Vitals:   04/12/20 1444  BP: (!) 86/52  Pulse: 78  Weight: 45.9 kg (101 lb 3.2 oz)     Exam:   General:  Elderly. No resp difficulty HEENT: normal Neck: supple. JVP 7. Carotids 2+ bilat; + bruits. No lymphadenopathy or thryomegaly appreciated. Cor: PMI nondisplaced. Regular rate & rhythm. 2/6 AS PICC line left chest Lungs: clear with decreased BS throguhout Abdomen: soft, nontender, nondistended. No hepatosplenomegaly. No bruits or masses. Good bowel sounds. Extremities: no cyanosis, clubbing, rash, edema Neuro: alert & orientedx3, cranial nerves grossly intact. moves all 4 extremities w/o difficulty. Affect pleasant  Recent Labs: 10/28/2019: BUN 23; Creatinine, Ser 1.26; Hemoglobin 10.2; Magnesium 2.3; Platelets 445; Potassium 4.1; Sodium 140  Personally reviewed   Wt Readings from Last 3 Encounters:  04/12/20 45.9 kg (101 lb 3.2 oz)  12/18/18 50.7 kg (111 lb 12.8 oz)  08/17/18 48.4 kg (106 lb 12.8 oz)     ECG:  NSR with 1AVB (262ms) + PACs +IVCD Personally reviewed   ASSESSMENT AND PLAN:  1) Chronic Systolic Heart Failure: Mixed ischemic/nonischemic cardiomyopathy (degree of LV dysfunction is out of proportion to CAD), EF 25% with moderate RV systolic  dysfunction .  Echo  8/18 EF 25-30% Mild AS - Previously longstanding NYHA IV.  - Now on milrinone with stable NYHA II- III symptoms  - Volume improved after recent course of metolazone although developed some AKI.  - Continue torsemide 40 mg BID. Will add metolazone 2.5 with kcl 40 every Monday. Follow labs - Off lisinopril due to AKI and low BP - Continue home milrinone 0.125 mcg/kg/min for palliative purposes. Has failed weans in the past.  - Problems with PICC line dislodgement improved with changing HHRN. Will follow 2) Atrial fibrillation:  - Paroxysmal.   - In NSR today - On apixaban 2.5 mg BID. (weight less than 60 kg and age >59). No bleeding -  Continue amiodarone 100 mg daily.   3) Limited Code: No CPR or defibrillation 4) CAD:   - No s/s of ischemia - Off ASA with Eliquis. Intolerant of statins in past.  5) Dementia; - Progressive. 6) Aortic stenosis - Mild by echo 8/18. Follow.   Signed, Glori Bickers, MD  04/12/2020 3:03 PM  Advanced Heart Failure [place] [address] Heart and [place] [address] [phone] (office) [phone] (fax)

## 2020-04-12 NOTE — Patient Instructions (Signed)
Take Metolazone 2.5 mg every Monday  Take Potassium 20 meq every Monday  Your physician recommends that you schedule a follow-up appointment in: 3 months  If you have any questions or concerns before your next appointment please send [place] a message through [place] or call our office at [phone].    TO LEAVE A MESSAGE FOR THE NURSE SELECT OPTION 2, PLEASE LEAVE A MESSAGE INCLUDING: . YOUR NAME . DATE OF BIRTH . CALL BACK NUMBER . REASON FOR CALL**this is important as we prioritize the call backs  [place] AS LONG AS YOU CALL BEFORE 4:00 PM  At the [facility], you and your health needs are our priority. As part of our continuing mission to provide you with exceptional heart care, we have created designated Provider Care Teams. These Care Teams include your primary Cardiologist (physician) and Advanced Practice Providers (APPs- Physician Assistants and Nurse Practitioners) who all work together to provide you with the care you need, when you need it.   You may see any of the following providers on your designated Care Team at your next follow up: Marland Kitchen Dr Glori Bickers . Dr Loralie Champagne . Darrick Grinder, NP . Lyda Jester, PA . Audry Riles, PharmD   Please be sure to bring in all your medications bottles to every appointment.

## 2020-04-12 NOTE — Addendum Note (Signed)
Encounter addended by: Scarlette Calico, RN on: 04/12/2020 3:28 PM  Actions taken: Pharmacy for encounter modified, Order list changed, Clinical Note Signed

## 2020-04-13 DIAGNOSIS — I509 Heart failure, unspecified: Secondary | ICD-10-CM | POA: Diagnosis not present

## 2020-04-21 ENCOUNTER — Telehealth (HOSPITAL_COMMUNITY): Payer: Self-pay | Admitting: Cardiology

## 2020-04-21 ENCOUNTER — Other Ambulatory Visit (HOSPITAL_COMMUNITY): Payer: Self-pay | Admitting: Internal Medicine

## 2020-04-21 NOTE — Telephone Encounter (Signed)
Abnormal labs received from [place] drawn 04/20/20 Cr 2.80 BUN 68 NA 137 K 4.3  Per Amy Clegg,NP Repeat labs x 1 week   Order [facility]ed to [facility] at [phone]

## 2020-04-26 ENCOUNTER — Ambulatory Visit
Admission: RE | Admit: 2020-04-26 | Discharge: 2020-04-26 | Disposition: A | Discharge status: Home / Self Care | Payer: Medicare Other | Source: Ambulatory Visit | Attending: Physician Assistant | Admitting: Physician Assistant

## 2020-04-26 ENCOUNTER — Other Ambulatory Visit: Payer: Self-pay | Admitting: Physician Assistant

## 2020-04-26 DIAGNOSIS — M549 Dorsalgia, unspecified: Secondary | ICD-10-CM | POA: Diagnosis not present

## 2020-04-26 DIAGNOSIS — N3001 Acute cystitis with hematuria: Secondary | ICD-10-CM | POA: Diagnosis not present

## 2020-04-26 DIAGNOSIS — R410 Disorientation, unspecified: Secondary | ICD-10-CM | POA: Diagnosis not present

## 2020-05-03 ENCOUNTER — Inpatient Hospital Stay (HOSPITAL_COMMUNITY)
Admission: EM | Admit: 2020-05-03 | Discharge: 2020-05-10 | DRG: 871 | Disposition: A | Discharge status: Hospice | Payer: Medicare Other | Attending: Internal Medicine | Admitting: Internal Medicine

## 2020-05-03 ENCOUNTER — Emergency Department (HOSPITAL_COMMUNITY): Payer: Medicare Other

## 2020-05-03 ENCOUNTER — Other Ambulatory Visit: Payer: Self-pay

## 2020-05-03 ENCOUNTER — Encounter (HOSPITAL_COMMUNITY): Payer: Self-pay | Admitting: Internal Medicine

## 2020-05-03 DIAGNOSIS — K5792 Diverticulitis of intestine, part unspecified, without perforation or abscess without bleeding: Secondary | ICD-10-CM

## 2020-05-03 DIAGNOSIS — I10 Essential (primary) hypertension: Secondary | ICD-10-CM | POA: Diagnosis not present

## 2020-05-03 DIAGNOSIS — E872 Acidosis: Secondary | ICD-10-CM | POA: Diagnosis present

## 2020-05-03 DIAGNOSIS — Z7189 Other specified counseling: Secondary | ICD-10-CM | POA: Diagnosis not present

## 2020-05-03 DIAGNOSIS — N1832 Chronic kidney disease, stage 3b: Secondary | ICD-10-CM | POA: Diagnosis present

## 2020-05-03 DIAGNOSIS — I739 Peripheral vascular disease, unspecified: Secondary | ICD-10-CM | POA: Diagnosis present

## 2020-05-03 DIAGNOSIS — E860 Dehydration: Secondary | ICD-10-CM | POA: Diagnosis present

## 2020-05-03 DIAGNOSIS — R404 Transient alteration of awareness: Secondary | ICD-10-CM | POA: Diagnosis not present

## 2020-05-03 DIAGNOSIS — Z515 Encounter for palliative care: Secondary | ICD-10-CM | POA: Diagnosis not present

## 2020-05-03 DIAGNOSIS — Z91018 Allergy to other foods: Secondary | ICD-10-CM

## 2020-05-03 DIAGNOSIS — Z888 Allergy status to other drugs, medicaments and biological substances status: Secondary | ICD-10-CM

## 2020-05-03 DIAGNOSIS — I2510 Atherosclerotic heart disease of native coronary artery without angina pectoris: Secondary | ICD-10-CM | POA: Diagnosis present

## 2020-05-03 DIAGNOSIS — A419 Sepsis, unspecified organism: Secondary | ICD-10-CM | POA: Diagnosis present

## 2020-05-03 DIAGNOSIS — I714 Abdominal aortic aneurysm, without rupture, unspecified: Secondary | ICD-10-CM | POA: Diagnosis present

## 2020-05-03 DIAGNOSIS — Z7901 Long term (current) use of anticoagulants: Secondary | ICD-10-CM

## 2020-05-03 DIAGNOSIS — D631 Anemia in chronic kidney disease: Secondary | ICD-10-CM | POA: Diagnosis present

## 2020-05-03 DIAGNOSIS — Z7401 Bed confinement status: Secondary | ICD-10-CM | POA: Diagnosis not present

## 2020-05-03 DIAGNOSIS — I5022 Chronic systolic (congestive) heart failure: Secondary | ICD-10-CM | POA: Diagnosis present

## 2020-05-03 DIAGNOSIS — K5732 Diverticulitis of large intestine without perforation or abscess without bleeding: Secondary | ICD-10-CM | POA: Diagnosis present

## 2020-05-03 DIAGNOSIS — I509 Heart failure, unspecified: Secondary | ICD-10-CM | POA: Diagnosis not present

## 2020-05-03 DIAGNOSIS — D649 Anemia, unspecified: Secondary | ICD-10-CM | POA: Diagnosis not present

## 2020-05-03 DIAGNOSIS — Z885 Allergy status to narcotic agent status: Secondary | ICD-10-CM

## 2020-05-03 DIAGNOSIS — C787 Secondary malignant neoplasm of liver and intrahepatic bile duct: Secondary | ICD-10-CM | POA: Diagnosis present

## 2020-05-03 DIAGNOSIS — Z955 Presence of coronary angioplasty implant and graft: Secondary | ICD-10-CM

## 2020-05-03 DIAGNOSIS — Z91048 Other nonmedicinal substance allergy status: Secondary | ICD-10-CM

## 2020-05-03 DIAGNOSIS — R627 Adult failure to thrive: Secondary | ICD-10-CM | POA: Diagnosis present

## 2020-05-03 DIAGNOSIS — D509 Iron deficiency anemia, unspecified: Secondary | ICD-10-CM | POA: Diagnosis present

## 2020-05-03 DIAGNOSIS — E876 Hypokalemia: Secondary | ICD-10-CM | POA: Diagnosis not present

## 2020-05-03 DIAGNOSIS — Z66 Do not resuscitate: Secondary | ICD-10-CM | POA: Diagnosis present

## 2020-05-03 DIAGNOSIS — I272 Pulmonary hypertension, unspecified: Secondary | ICD-10-CM | POA: Diagnosis present

## 2020-05-03 DIAGNOSIS — N179 Acute kidney failure, unspecified: Secondary | ICD-10-CM

## 2020-05-03 DIAGNOSIS — I701 Atherosclerosis of renal artery: Secondary | ICD-10-CM | POA: Diagnosis present

## 2020-05-03 DIAGNOSIS — B9620 Unspecified Escherichia coli [E. coli] as the cause of diseases classified elsewhere: Secondary | ICD-10-CM | POA: Diagnosis present

## 2020-05-03 DIAGNOSIS — I130 Hypertensive heart and chronic kidney disease with heart failure and stage 1 through stage 4 chronic kidney disease, or unspecified chronic kidney disease: Secondary | ICD-10-CM | POA: Diagnosis present

## 2020-05-03 DIAGNOSIS — M2550 Pain in unspecified joint: Secondary | ICD-10-CM | POA: Diagnosis not present

## 2020-05-03 DIAGNOSIS — Z6820 Body mass index (BMI) 20.0-20.9, adult: Secondary | ICD-10-CM | POA: Diagnosis not present

## 2020-05-03 DIAGNOSIS — I428 Other cardiomyopathies: Secondary | ICD-10-CM | POA: Diagnosis present

## 2020-05-03 DIAGNOSIS — E785 Hyperlipidemia, unspecified: Secondary | ICD-10-CM | POA: Diagnosis present

## 2020-05-03 DIAGNOSIS — M4856XA Collapsed vertebra, not elsewhere classified, lumbar region, initial encounter for fracture: Secondary | ICD-10-CM | POA: Diagnosis present

## 2020-05-03 DIAGNOSIS — R410 Disorientation, unspecified: Secondary | ICD-10-CM | POA: Diagnosis not present

## 2020-05-03 DIAGNOSIS — I4891 Unspecified atrial fibrillation: Secondary | ICD-10-CM | POA: Diagnosis not present

## 2020-05-03 DIAGNOSIS — J690 Pneumonitis due to inhalation of food and vomit: Secondary | ICD-10-CM | POA: Diagnosis present

## 2020-05-03 DIAGNOSIS — Z8249 Family history of ischemic heart disease and other diseases of the circulatory system: Secondary | ICD-10-CM

## 2020-05-03 DIAGNOSIS — Z20822 Contact with and (suspected) exposure to covid-19: Secondary | ICD-10-CM | POA: Diagnosis present

## 2020-05-03 DIAGNOSIS — I447 Left bundle-branch block, unspecified: Secondary | ICD-10-CM | POA: Diagnosis not present

## 2020-05-03 DIAGNOSIS — I700 Atherosclerosis of aorta: Secondary | ICD-10-CM | POA: Diagnosis not present

## 2020-05-03 DIAGNOSIS — I491 Atrial premature depolarization: Secondary | ICD-10-CM | POA: Diagnosis not present

## 2020-05-03 DIAGNOSIS — R531 Weakness: Secondary | ICD-10-CM | POA: Diagnosis not present

## 2020-05-03 DIAGNOSIS — F0390 Unspecified dementia without behavioral disturbance: Secondary | ICD-10-CM | POA: Diagnosis present

## 2020-05-03 DIAGNOSIS — I255 Ischemic cardiomyopathy: Secondary | ICD-10-CM | POA: Diagnosis not present

## 2020-05-03 DIAGNOSIS — F419 Anxiety disorder, unspecified: Secondary | ICD-10-CM | POA: Diagnosis present

## 2020-05-03 DIAGNOSIS — R0682 Tachypnea, not elsewhere classified: Secondary | ICD-10-CM | POA: Diagnosis present

## 2020-05-03 DIAGNOSIS — I480 Paroxysmal atrial fibrillation: Secondary | ICD-10-CM | POA: Diagnosis present

## 2020-05-03 DIAGNOSIS — N390 Urinary tract infection, site not specified: Secondary | ICD-10-CM | POA: Diagnosis present

## 2020-05-03 DIAGNOSIS — Z96642 Presence of left artificial hip joint: Secondary | ICD-10-CM | POA: Diagnosis present

## 2020-05-03 DIAGNOSIS — Z79899 Other long term (current) drug therapy: Secondary | ICD-10-CM

## 2020-05-03 DIAGNOSIS — C801 Malignant (primary) neoplasm, unspecified: Secondary | ICD-10-CM | POA: Diagnosis not present

## 2020-05-03 DIAGNOSIS — R0902 Hypoxemia: Secondary | ICD-10-CM | POA: Diagnosis not present

## 2020-05-03 DIAGNOSIS — I959 Hypotension, unspecified: Secondary | ICD-10-CM | POA: Diagnosis not present

## 2020-05-03 DIAGNOSIS — R6520 Severe sepsis without septic shock: Secondary | ICD-10-CM | POA: Diagnosis not present

## 2020-05-03 LAB — URINALYSIS, ROUTINE W REFLEX MICROSCOPIC
Bilirubin Urine: NEGATIVE
Glucose, UA: NEGATIVE mg/dL
Ketones, ur: NEGATIVE mg/dL
Nitrite: NEGATIVE
Protein, ur: 30 mg/dL — AB
Specific Gravity, Urine: 1.01 (ref 1.005–1.030)
pH: 6 (ref 5.0–8.0)

## 2020-05-03 LAB — CBC WITH DIFFERENTIAL/PLATELET
Abs Immature Granulocytes: 0.25 10*3/uL — ABNORMAL HIGH (ref 0.00–0.07)
Basophils Absolute: 0 10*3/uL (ref 0.0–0.1)
Basophils Relative: 0 %
Eosinophils Absolute: 0 10*3/uL (ref 0.0–0.5)
Eosinophils Relative: 0 %
HCT: 29.2 % — ABNORMAL LOW (ref 36.0–46.0)
Hemoglobin: 8.5 g/dL — ABNORMAL LOW (ref 12.0–15.0)
Immature Granulocytes: 1 %
Lymphocytes Relative: 2 %
Lymphs Abs: 0.6 10*3/uL — ABNORMAL LOW (ref 0.7–4.0)
MCH: 20.6 pg — ABNORMAL LOW (ref 26.0–34.0)
MCHC: 29.1 g/dL — ABNORMAL LOW (ref 30.0–36.0)
MCV: 70.7 fL — ABNORMAL LOW (ref 80.0–100.0)
Monocytes Absolute: 1.1 10*3/uL — ABNORMAL HIGH (ref 0.1–1.0)
Monocytes Relative: 5 %
Neutro Abs: 22.7 10*3/uL — ABNORMAL HIGH (ref 1.7–7.7)
Neutrophils Relative %: 92 %
Platelets: 478 10*3/uL — ABNORMAL HIGH (ref 150–400)
RBC: 4.13 MIL/uL (ref 3.87–5.11)
RDW: 18.2 % — ABNORMAL HIGH (ref 11.5–15.5)
WBC: 24.7 10*3/uL — ABNORMAL HIGH (ref 4.0–10.5)
nRBC: 0 % (ref 0.0–0.2)

## 2020-05-03 LAB — BASIC METABOLIC PANEL
Anion gap: 14 (ref 5–15)
BUN: 99 mg/dL — ABNORMAL HIGH (ref 8–23)
CO2: 21 mmol/L — ABNORMAL LOW (ref 22–32)
Calcium: 9 mg/dL (ref 8.9–10.3)
Chloride: 100 mmol/L (ref 98–111)
Creatinine, Ser: 3.09 mg/dL — ABNORMAL HIGH (ref 0.44–1.00)
GFR calc Af Amer: 14 mL/min — ABNORMAL LOW (ref >60)
GFR calc non Af Amer: 12 mL/min — ABNORMAL LOW (ref >60)
Glucose, Bld: 129 mg/dL — ABNORMAL HIGH (ref 70–99)
Potassium: 3.5 mmol/L (ref 3.5–5.1)
Sodium: 135 mmol/L (ref 135–145)

## 2020-05-03 LAB — HEPATIC FUNCTION PANEL
ALT: 22 U/L (ref 0–44)
AST: 40 U/L (ref 15–41)
Albumin: 2.7 g/dL — ABNORMAL LOW (ref 3.5–5.0)
Alkaline Phosphatase: 155 U/L — ABNORMAL HIGH (ref 38–126)
Bilirubin, Direct: 0.1 mg/dL (ref 0.0–0.2)
Indirect Bilirubin: 0.6 mg/dL (ref 0.3–0.9)
Total Bilirubin: 0.7 mg/dL (ref 0.3–1.2)
Total Protein: 6 g/dL — ABNORMAL LOW (ref 6.5–8.1)

## 2020-05-03 LAB — LACTIC ACID, PLASMA
Lactic Acid, Venous: 1.1 mmol/L (ref 0.5–1.9)
Lactic Acid, Venous: 0.9 mmol/L (ref 0.5–1.9)

## 2020-05-03 LAB — SARS CORONAVIRUS 2 BY RT PCR (HOSPITAL ORDER, PERFORMED IN ~~LOC~~ HOSPITAL LAB): SARS Coronavirus 2: NEGATIVE

## 2020-05-03 MED ORDER — ALLOPURINOL 100 MG PO TABS
100.0000 mg | ORAL_TABLET | Freq: Two times a day (BID) | ORAL | Status: DC
  Administered 2020-05-04 (×2): 100 mg via ORAL
  Filled 2020-05-03 (×4): qty 1

## 2020-05-03 MED ORDER — OLANZAPINE 2.5 MG PO TABS
2.5000 mg | ORAL_TABLET | Freq: Every day | ORAL | Status: DC
  Administered 2020-05-04 – 2020-05-09 (×5): 2.5 mg via ORAL
  Filled 2020-05-03 (×7): qty 1

## 2020-05-03 MED ORDER — AMIODARONE HCL 200 MG PO TABS
100.0000 mg | ORAL_TABLET | Freq: Every day | ORAL | Status: DC
  Filled 2020-05-03: qty 1

## 2020-05-03 MED ORDER — SODIUM CHLORIDE 0.9 % IV BOLUS
500.0000 mL | Freq: Once | INTRAVENOUS | Status: AC
  Administered 2020-05-03: 500 mL via INTRAVENOUS

## 2020-05-03 MED ORDER — APIXABAN 2.5 MG PO TABS
2.5000 mg | ORAL_TABLET | Freq: Two times a day (BID) | ORAL | Status: DC
  Administered 2020-05-04 – 2020-05-10 (×14): 2.5 mg via ORAL
  Filled 2020-05-03 (×16): qty 1

## 2020-05-03 MED ORDER — ONDANSETRON HCL 4 MG/2ML IJ SOLN
4.0000 mg | Freq: Once | INTRAMUSCULAR | Status: AC
  Administered 2020-05-03: 4 mg via INTRAVENOUS
  Filled 2020-05-03: qty 2

## 2020-05-03 MED ORDER — SODIUM CHLORIDE 0.9 % IV SOLN: Freq: Once | INTRAVENOUS | Status: AC

## 2020-05-03 MED ORDER — ONDANSETRON HCL 4 MG PO TABS
4.0000 mg | ORAL_TABLET | Freq: Four times a day (QID) | ORAL | Status: DC | PRN
  Administered 2020-05-08 – 2020-05-10 (×3): 4 mg via ORAL
  Filled 2020-05-03 (×3): qty 1

## 2020-05-03 MED ORDER — OMEGA-3-ACID ETHYL ESTERS 1 G PO CAPS
1.0000 g | ORAL_CAPSULE | Freq: Every day | ORAL | Status: DC
  Administered 2020-05-04: 1 g via ORAL
  Filled 2020-05-03 (×2): qty 1

## 2020-05-03 MED ORDER — BUSPIRONE HCL 5 MG PO TABS
5.0000 mg | ORAL_TABLET | Freq: Two times a day (BID) | ORAL | Status: DC
  Administered 2020-05-04 – 2020-05-10 (×14): 5 mg via ORAL
  Filled 2020-05-03 (×14): qty 1

## 2020-05-03 MED ORDER — ONDANSETRON HCL 4 MG/2ML IJ SOLN
4.0000 mg | Freq: Four times a day (QID) | INTRAMUSCULAR | Status: DC | PRN
  Administered 2020-05-04 – 2020-05-07 (×2): 4 mg via INTRAVENOUS
  Filled 2020-05-03 (×2): qty 2

## 2020-05-03 MED ORDER — PIPERACILLIN-TAZOBACTAM 3.375 G IVPB 30 MIN
3.3750 g | Freq: Once | INTRAVENOUS | Status: AC
  Administered 2020-05-03: 3.375 g via INTRAVENOUS
  Filled 2020-05-03: qty 50

## 2020-05-03 NOTE — ED Provider Notes (Signed)
[facility] EMERGENCY DEPARTMENT Provider Note   CSN: [id number] Arrival date & time: 05/03/20  1550     History Chief Complaint  Patient presents with  . Diarrhea  . Weakness    Jacqueline Reilly is a 86 y.o. female.  Level 5 caveat due to dementia.  History given by caregiver.  Patient with history of dementia pleasantly confused.  Mostly here for failure to thrive.  Has had nausea, vomiting, diarrhea.  Has had diarrhea for several weeks.  Just finished antibiotics for UTI.  Had bad nausea and vomiting last night.  Has not had anything to eat or drink today.  Seems to be more lethargic and off her baseline.  Patient may be has abdominal pain.  Very hard to question her given her dementia.  The history is provided by the patient.  Diarrhea Quality:  Mucous Severity:  Mild Onset quality:  Gradual Timing:  Intermittent Progression:  Unchanged Relieved by:  Nothing Risk factors: recent antibiotic use        Past Medical History:  Diagnosis Date  . AAA (abdominal aortic aneurysm) ([place])   . Atrial fibrillation ([place])   . Chronic systolic heart failure ([place])    a. ECHO (04/2014) EF 20-25%, diff HK, mild MR  . Coronary artery disease 2007   moderate ASCAD of the left system s/p PCI of the D2 and PCI of the left circ 03/2009  . Hyperlipidemia   . Hypertension   . PVD (peripheral vascular disease) ([place])   . Renal artery stenosis ([place])   . Ventricular dysfunction    left; ischemic    Patient Active Problem List   Diagnosis Date Noted  . Pulmonary hypertension ([place]) 08/02/2017  . SIRS (systemic inflammatory response syndrome) ([place]) 05/27/2017  . Acute blood loss anemia 05/27/2017  . Anxiety 05/27/2017  . CKD (chronic kidney disease), stage IV ([place]) 05/27/2017  . Heart failure with reduced ejection fraction ([place]) 04/05/2017  . Receiving inotropic medication 04/05/2017  . Hypokalemia 04/05/2017  . Malnutrition of moderate degree 03/05/2017  . Left displaced  femoral neck fracture ([place]) 03/04/2017  . Leukocytosis 03/04/2017  . Hypoxia   . Gram-negative bacteremia 09/01/2015  . Chronic anemia 09/01/2015  . Persistent atrial fibrillation ([place])   . Acute on chronic systolic CHF (congestive heart failure) ([place]) 06/21/2015  . Atrial fibrillation ([place]) 08/10/2014  . Physical deconditioning 05/26/2014  . Acute respiratory failure with hypoxia ([place]) 05/08/2014  . NSTEMI, initial episode of care ([place]) 05/07/2014  . Atrial fibrillation with rapid ventricular response ([place]) 05/07/2014  . NSTEMI (non-ST elevated myocardial infarction) ([place]) 05/07/2014  . Abnormal chest x-ray 04/24/2014  . CHF (congestive heart failure) ([place]) 04/23/2014  . Acute on chronic combined systolic and diastolic congestive heart failure ([place]) 04/23/2014  . AAA (abdominal aortic aneurysm) ([place]) 10/07/2013  . SOB (shortness of breath) 09/30/2013  . Coronary artery disease   . Ischemic dilated cardiomyopathy ([place])   . Hypertension     Past Surgical History:  Procedure Laterality Date  . ANGIOPLASTY    . ANTERIOR APPROACH HEMI HIP ARTHROPLASTY Left 03/05/2017   Procedure: ANTERIOR APPROACH LEFT HIP HEMI ARTHROPLASTY;  Surgeon: Leandrew Koyanagi, MD;  Location: [facility];  Service: Orthopedics;  Laterality: Left;  . CORONARY STENT PLACEMENT    . IR CV LINE INJECTION  03/06/2020  . IR FLUORO GUIDE CV LINE LEFT  03/06/2020  . IR GENERIC HISTORICAL  09/25/2016   IR FLUORO GUIDE CV LINE RIGHT 09/25/2016 Ardis Rowan, PA-C  [facility]  . IR US GUIDE VASC ACCESS LEFT  03/06/2020  . IR US GUIDE VASC ACCESS RIGHT  03/06/2020  . retinal cryopexy     right eye (for retinal detachment)     OB History   No obstetric history on file.     Family History  Problem Relation Age of Onset  . Heart disease Mother   . Heart disease Father   . Heart disease Brother        x 3    Social History   Tobacco Use  . Smoking status: Never Smoker  . Smokeless tobacco: Never Used  Vaping Use    . Vaping Use: Never used  Substance Use Topics  . Alcohol use: Yes    Comment: occasionally  . Drug use: No    Home Medications Prior to Admission medications   Medication Sig Start Date End Date Taking? Authorizing Provider  acetaminophen (TYLENOL) 500 MG tablet Take 1,000 mg by mouth 3 (three) times daily as needed (for back pain).    Yes [provider]  allopurinol (ZYLOPRIM) 100 MG tablet Take 100 mg by mouth 2 (two) times daily.  12/15/19  Yes [provider]  apixaban (ELIQUIS) 2.5 MG TABS tablet Take 1 tablet (2.5 mg total) by mouth 2 (two) times daily. 11/22/16  Yes Bensimhon, Shaune Pascal, MD  busPIRone (BUSPAR) 5 MG tablet Take 5 mg by mouth 2 (two) times daily.    Yes [provider]  Lactobacillus Rhamnosus, GG, (CULTURELLE) CAPS Take 1 capsule by mouth daily.   Yes [provider]  lisinopril (ZESTRIL) 2.5 MG tablet Take 2.5 mg by mouth in the morning.  02/17/20  Yes [provider]  metolazone (ZAROXOLYN) 2.5 MG tablet Take 1 tablet (2.5 mg total) by mouth once a week. Every Monday Patient taking differently: Take 2.5 mg by mouth every Monday.  04/12/20  Yes Bensimhon, Shaune Pascal, MD  milrinone (PRIMACOR) 20 MG/100 ML SOLN infusion Inject 0.125 mcg/kg/min into the vein continuous. Rate 1.6 ml/hr Weight= 43 kg   Yes [provider]  Multiple Vitamins-Minerals (ONE-A-DAY WOMENS 50+ ADVANTAGE) TABS Take 1 tablet by mouth daily with breakfast.   Yes [provider]  OLANZapine (ZYPREXA) 2.5 MG tablet Take 2.5 mg by mouth See admin instructions. Take 2.5 mg by mouth with supper and an additional 2.5 mg daily as needed/as directed 04/27/18  Yes [provider]  Omega-3 Fatty Acids (FISH OIL) 1200 MG CAPS Take 1,200 mg by mouth in the morning and at bedtime.   Yes [provider]  PACERONE 200 MG tablet 1/2 TABLET ONCE A DAY Patient taking differently: Take 100 mg by mouth in the morning.  01/11/19  Yes Bensimhon,  Shaune Pascal, MD  potassium chloride SA (KLOR-CON) 20 MEQ tablet Take 1 tablet (20 mEq total) by mouth once a week. Every Monday Patient taking differently: Take 20 mEq by mouth every Monday.  04/12/20  Yes Bensimhon, Shaune Pascal, MD  torsemide (DEMADEX) 20 MG tablet Take 2 tablets (40 mg total) by mouth 2 (two) times daily. 06/01/17  Yes Donne Hazel, MD  colchicine 0.6 MG tablet Take 0.6 mg by mouth daily. Patient not taking: Reported on 05/03/2020 12/15/19   [provider]  Multiple Vitamin (MULTIVITAMIN) tablet Take 1 tablet by mouth daily. Patient not taking: Reported on 05/03/2020 07/26/14   Larey Dresser, MD    Allergies    Tape, Codeine, Colchicine, and Garlic  Review of Systems  Review of Systems  Unable to perform ROS: Dementia  Gastrointestinal: Positive for diarrhea.    Physical Exam Updated Vital Signs  ED Triage Vitals  Enc Vitals Group     BP 05/03/20 1555 (!) 95/50     Pulse Rate 05/03/20 1553 75     Resp 05/03/20 1553 (!) 27     Temp 05/03/20 1555 97.7 F (36.5 C)     Temp Source 05/03/20 1555 Oral     SpO2 05/03/20 1552 98 %     Weight --      Height --      Head Circumference --      Peak Flow --      Pain Score --      Pain Loc --      Pain Edu? --      Excl. in [place]? --     Physical Exam Vitals and nursing note reviewed.  Constitutional:      General: She is not in acute distress.    Appearance: She is well-developed. She is not ill-appearing.  HENT:     Head: Normocephalic and atraumatic.     Nose: Nose normal.     Mouth/Throat:     Mouth: Mucous membranes are dry.  Eyes:     Extraocular Movements: Extraocular movements intact.     Conjunctiva/sclera: Conjunctivae normal.     Pupils: Pupils are equal, round, and reactive to light.  Cardiovascular:     Rate and Rhythm: Normal rate and regular rhythm.     Pulses: Normal pulses.     Heart sounds: Normal heart sounds. No murmur heard.   Pulmonary:     Effort: Pulmonary effort is normal. No  respiratory distress.     Breath sounds: Normal breath sounds.     Comments: PICC line over left chest Abdominal:     Palpations: Abdomen is soft.     Tenderness: There is abdominal tenderness.  Musculoskeletal:     Cervical back: Neck supple.  Skin:    General: Skin is warm and dry.     Capillary Refill: Capillary refill takes less than 2 seconds.  Neurological:     Mental Status: She is alert. Mental status is at baseline.     Comments: Patient is awake and alert and talks but does not follow commands very well appears to move all extremities     ED Results / Procedures / Treatments   Labs (all labs ordered are listed, but only abnormal results are displayed) Labs Reviewed  CBC WITH DIFFERENTIAL/PLATELET - Abnormal; Notable for the following components:      Result Value   WBC 24.7 (*)    Hemoglobin 8.5 (*)    HCT 29.2 (*)    MCV 70.7 (*)    MCH 20.6 (*)    MCHC 29.1 (*)    RDW 18.2 (*)    Platelets 478 (*)    Neutro Abs 22.7 (*)    Lymphs Abs 0.6 (*)    Monocytes Absolute 1.1 (*)    Abs Immature Granulocytes 0.25 (*)    All other components within normal limits  BASIC METABOLIC PANEL - Abnormal; Notable for the following components:   CO2 21 (*)    Glucose, Bld 129 (*)    BUN 99 (*)    Creatinine, Ser 3.09 (*)    GFR calc non Af Amer 12 (*)    GFR calc Af Amer 14 (*)    All other components within normal limits  HEPATIC FUNCTION PANEL - Abnormal; Notable for the following components:   Total Protein 6.0 (*)    Albumin 2.7 (*)    Alkaline Phosphatase 155 (*)    All other components within normal limits  URINE CULTURE  C DIFFICILE QUICK SCREEN W PCR REFLEX  GASTROINTESTINAL PANEL BY PCR, STOOL (REPLACES STOOL CULTURE)  CULTURE, BLOOD (ROUTINE X 2)  CULTURE, BLOOD (ROUTINE X 2)  LACTIC ACID, PLASMA  LACTIC ACID, PLASMA  URINALYSIS, ROUTINE W REFLEX MICROSCOPIC    EKG EKG Interpretation  Date/Time:  Wednesday May 03 2020 16:59:49 EDT Ventricular Rate:    75 PR Interval:    QRS Duration: 149 QT Interval:  430 QTC Calculation: 481 R Axis:   -57 Text Interpretation: Sinus rhythm Atrial premature complex Borderline prolonged PR interval Left bundle branch block Confirmed by Lennice Sites [phone]) on 05/03/2020 5:12:56 PM   Radiology CT ABDOMEN PELVIS WO CONTRAST  Result Date: 05/03/2020 CLINICAL DATA:  86 year old female with nausea vomiting. EXAM: CT ABDOMEN AND PELVIS WITHOUT CONTRAST TECHNIQUE: Multidetector CT imaging of the abdomen and pelvis was performed following the standard protocol without IV contrast. COMPARISON:  CT of the abdomen pelvis dated 01/08/2016. FINDINGS: Evaluation of this exam is limited in the absence of intravenous contrast. Evaluation is also limited due to streak artifact caused by patient's arms. Lower chest: There are bibasilar interstitial coarsening and nodularity which may be chronic. Atypical pneumonia or aspiration is not excluded. Clinical correlation is recommended. There is cardiomegaly with advanced multi vessel coronary vascular calcification. No intra-abdominal free air or free fluid. Hepatobiliary: Innumerable hepatic hypodense masses with the largest confluent of mass measuring 7.0 x 4.6 cm most consistent with metastatic disease. No intrahepatic biliary ductal dilatation. There are multiple stones within the gallbladder. No pericholecystic fluid or evidence of acute cholecystitis by CT. Pancreas: Unremarkable. No pancreatic ductal dilatation or surrounding inflammatory changes. Spleen: Normal in size without focal abnormality. Adrenals/Urinary Tract: The adrenal glands are unremarkable. There is no hydronephrosis on either side. Linear calcific density in the upper pole of the left kidney likely represents vascular calcification and less likely nonobstructing stone. The urinary bladder is grossly unremarkable. Stomach/Bowel: There is sigmoid diverticulosis and scattered colonic diverticula. There is inflammatory  changes centered at a proximal transverse colon diverticula (64/6 and 40/3) consistent with acute diverticulitis. No evidence of abscess or perforation. There is no bowel obstruction. The appendix is not visualized with certainty. No inflammatory changes identified in the right lower quadrant. Vascular/Lymphatic: There is advanced aortoiliac atherosclerotic disease. There is a 4.2 cm fusiform infrarenal abdominal aortic aneurysm. Evaluation of the abdominal aorta is very limited on this noncontrast CT. The aortic aneurysm however appears larger compared to the prior CT. There is apparent area of discontinuity in the atherosclerotic calcification of the abdominal aorta with tissue density external to the margin of the aortic wall (40/3). This likely corresponds to the penetrating ulcer seen on the prior CT. However, aortic leak is not excluded. CT with IV contrast is recommended for better evaluation. The IVC is unremarkable. No portal venous gas. No adenopathy. Reproductive: The uterus is grossly unremarkable. There is a 12 mm right ovarian cystic structure. Other: None Musculoskeletal: Osteopenia with degenerative changes of the spine. There is a total left hip arthroplasty. There is age indeterminate compression fracture of L4 with approximately 25% loss of vertebral body height, possibly acute or subacute. Clinical correlation is recommended. No retropulsed fragment. IMPRESSION: 1. Acute diverticulitis of the proximal transverse colon. No abscess or perforation. 2.  Innumerable hepatic hypodense masses most consistent with metastatic disease. 3. Cholelithiasis. 4. Age indeterminate L4 compression fracture, possibly acute or subacute. Clinical correlation is recommended. 5. A 4.2 cm infrarenal abdominal aortic aneurysm, increased in size since the prior CT. Further evaluation with CT with IV contrast recommended. 6. Aortic Atherosclerosis (ICD10-I70.0). Electronically Signed   By: Anner Crete M.D.   On:  05/03/2020 20:05   DG Chest Portable 1 View  Result Date: 05/03/2020 CLINICAL DATA:  Weakness. EXAM: PORTABLE CHEST 1 VIEW COMPARISON:  March 30, 2020 FINDINGS: There is stable left-sided venous catheter positioning. Mild diffuse chronic appearing increased lung markings are seen with mild areas of superimposed atelectasis and/or infiltrate noted within the right upper lobe and left lung base. There is no evidence of a pleural effusion or pneumothorax. The cardiac silhouette is markedly enlarged. There is moderate severity calcification of the thoracic aorta. Moderate to marked severity degenerative changes seen involving both shoulders and throughout the thoracic spine. IMPRESSION: Chronic appearing increased lung markings with mild superimposed atelectasis and/or infiltrate within the right upper lobe and left lung base. Electronically Signed   By: Virgina Norfolk M.D.   On: 05/03/2020 17:27    Procedures .Critical Care Performed by: Lennice Sites, DO Authorized by: Lennice Sites, DO   Critical care provider statement:    Critical care time (minutes):  35   Critical care was necessary to treat or prevent imminent or life-threatening deterioration of the following conditions:  Sepsis   Critical care was time spent personally by me on the following activities:  Development of treatment plan with patient or surrogate, blood draw for specimens, discussions with primary provider, evaluation of patient's response to treatment, examination of patient, obtaining history from patient or surrogate, ordering and performing treatments and interventions, ordering and review of laboratory studies, ordering and review of radiographic studies, pulse oximetry, re-evaluation of patient's condition and review of old charts   I assumed direction of critical care for this patient from another provider in my specialty: no     (including critical care time)  Medications Ordered in ED Medications  sodium chloride  0.9 % bolus 500 mL (0 mLs Intravenous Stopped 05/03/20 1818)  ondansetron (ZOFRAN) injection 4 mg (4 mg Intravenous Given 05/03/20 1803)  0.9 %  sodium chloride infusion ( Intravenous New Bag/Given 05/03/20 1818)  piperacillin-tazobactam (ZOSYN) IVPB 3.375 g (0 g Intravenous Stopped 05/03/20 2101)    ED Course  I have reviewed the triage vital signs and the nursing notes.  Pertinent labs & imaging results that were available during my care of the patient were reviewed by me and considered in my medical decision making (see chart for details).    MDM Rules/Calculators/A&P                          CANDIS KABEL is a 86 year old female with history of high cholesterol, CAD, heart failure on chronic milrinone, dementia who presents to the ED with diarrhea, weakness.  Patient with overall unremarkable vitals.  On home 4 L of oxygen.  Having abdominal discomfort.  Recent antibiotics for UTI.  Patient has white count of 24.  AKI with creatinine above 3.  CT scan shows acute diverticulitis.  Lactic acid however is normal.  No fever.  Blood pressure stable in the 90s.  Gentle hydration was given with IV fluids at 75 an hour given heart failure history and patient on milrinone.  Patient is DNR.  Was given  IV Zosyn for sepsis from acute diverticulitis.  Admitted to medicine.  Stable throughout my care.  This chart was dictated using voice recognition software.  Despite best efforts to proofread,  errors can occur which can change the documentation meaning.    Final Clinical Impression(s) / ED Diagnoses Final diagnoses:  Sepsis, due to unspecified organism, unspecified whether acute organ dysfunction present [facility])  Acute diverticulitis  AKI (acute kidney injury) [facility])    Rx / [place] Orders ED Discharge Orders    None       Lennice Sites, DO 05/03/20 2154

## 2020-05-03 NOTE — H&P (Signed)
History and Physical    [name] QPY:[id number] DOB: [birth year] DOA: 05/03/2020  PCP: [name], MD  Patient coming from: [place].  Chief Complaint: Nausea vomiting.  HPI: Jacqueline Reilly is a 86 y.o. female with history of chronic mixed ischemic/nonischemic cardiomyopathy last EF was 25% with moderate right ventricular systolic dysfunction on milrinone, atrial fibrillation on amiodarone apixaban with dementia, anemia abdominal aortic aneurysm was brought to the ER the patient had been having nausea vomiting poor oral intake for the last few days.  Patient also mildly lethargic.  Patient unable to provide any history due to dementia.  Most of the history was obtained from the ER physician who discussed with patient's healthcare power of attorney and also patient's son.  ED Course: In the ER patient had low normal blood pressure in the 12W systolic.  Lab work is significant for acute renal failure and leukocytosis.  Creatinine has worsened from 1.2 in December 2020 and is around 3 and WBC count is 24.7 with a hemoglobin of 8.5.  Chest x-ray shows possible infiltrates.  CT abdomen pelvis is showing possible acute diverticulitis involving the proximal transverse colon.  In addition there is concern for possible metastatic lesions in the liver.  L4 compression fracture also.  Patient was started on empiric antibiotics.  Initially patient was given fluid for 1 L for the acute renal failure.  Patient admitted for possible level being sepsis from acute diverticulitis with possible aspiration pneumonia and acute renal failure.  Review of Systems: As per HPI, rest all negative.   Past Medical History:  Diagnosis Date  . AAA (abdominal aortic aneurysm) ([place])   . Atrial fibrillation ([place])   . Chronic systolic heart failure ([place])    a. ECHO (04/2014) EF 20-25%, diff HK, mild MR  . Coronary artery disease 2007   moderate ASCAD of the left system s/p PCI of the D2 and PCI of the  left circ 03/2009  . Hyperlipidemia   . Hypertension   . PVD (peripheral vascular disease) ([place])   . Renal artery stenosis ([place])   . Ventricular dysfunction    left; ischemic    Past Surgical History:  Procedure Laterality Date  . ANGIOPLASTY    . ANTERIOR APPROACH HEMI HIP ARTHROPLASTY Left 03/05/2017   Procedure: ANTERIOR APPROACH LEFT HIP HEMI ARTHROPLASTY;  Surgeon: Leandrew Koyanagi, MD;  Location: [facility];  Service: Orthopedics;  Laterality: Left;  . CORONARY STENT PLACEMENT    . IR CV LINE INJECTION  03/06/2020  . IR FLUORO GUIDE CV LINE LEFT  03/06/2020  . IR GENERIC HISTORICAL  09/25/2016   IR FLUORO GUIDE CV LINE RIGHT 09/25/2016 Ardis Rowan, PA-C [facility]  . IR US GUIDE VASC ACCESS LEFT  03/06/2020  . IR US GUIDE VASC ACCESS RIGHT  03/06/2020  . retinal cryopexy     right eye (for retinal detachment)     reports that she has never smoked. She has never used smokeless tobacco. She reports current alcohol use. She reports that she does not use drugs.  Allergies  Allergen Reactions  . Tape Other (See Comments)    SKIN WILL TEAR EASILY!!!!  . Codeine Nausea Only  . Colchicine Diarrhea  . Garlic Nausea Only    Family History  Problem Relation Age of Onset  . Heart disease Mother   . Heart disease Father   . Heart disease Brother        x 3    Prior to  Admission medications   Medication Sig Start Date End Date Taking? Authorizing Provider  acetaminophen (TYLENOL) 500 MG tablet Take 1,000 mg by mouth 3 (three) times daily as needed (for back pain).    Yes [provider]  allopurinol (ZYLOPRIM) 100 MG tablet Take 100 mg by mouth 2 (two) times daily.  12/15/19  Yes [provider]  apixaban (ELIQUIS) 2.5 MG TABS tablet Take 1 tablet (2.5 mg total) by mouth 2 (two) times daily. 11/22/16  Yes Bensimhon, Shaune Pascal, MD  busPIRone (BUSPAR) 5 MG tablet Take 5 mg by mouth 2 (two) times daily.    Yes [provider]  Lactobacillus Rhamnosus, GG,  (CULTURELLE) CAPS Take 1 capsule by mouth daily.   Yes [provider]  lisinopril (ZESTRIL) 2.5 MG tablet Take 2.5 mg by mouth in the morning.  02/17/20  Yes [provider]  metolazone (ZAROXOLYN) 2.5 MG tablet Take 1 tablet (2.5 mg total) by mouth once a week. Every Monday Patient taking differently: Take 2.5 mg by mouth every Monday.  04/12/20  Yes Bensimhon, Shaune Pascal, MD  milrinone (PRIMACOR) 20 MG/100 ML SOLN infusion Inject 0.125 mcg/kg/min into the vein continuous. Rate 1.6 ml/hr Weight= 43 kg   Yes [provider]  Multiple Vitamins-Minerals (ONE-A-DAY WOMENS 50+ ADVANTAGE) TABS Take 1 tablet by mouth daily with breakfast.   Yes [provider]  OLANZapine (ZYPREXA) 2.5 MG tablet Take 2.5 mg by mouth See admin instructions. Take 2.5 mg by mouth with supper and an additional 2.5 mg daily as needed/as directed 04/27/18  Yes [provider]  Omega-3 Fatty Acids (FISH OIL) 1200 MG CAPS Take 1,200 mg by mouth in the morning and at bedtime.   Yes [provider]  PACERONE 200 MG tablet 1/2 TABLET ONCE A DAY Patient taking differently: Take 100 mg by mouth in the morning.  01/11/19  Yes Bensimhon, Shaune Pascal, MD  potassium chloride SA (KLOR-CON) 20 MEQ tablet Take 1 tablet (20 mEq total) by mouth once a week. Every Monday Patient taking differently: Take 20 mEq by mouth every Monday.  04/12/20  Yes Bensimhon, Shaune Pascal, MD  torsemide (DEMADEX) 20 MG tablet Take 2 tablets (40 mg total) by mouth 2 (two) times daily. 06/01/17  Yes Donne Hazel, MD  colchicine 0.6 MG tablet Take 0.6 mg by mouth daily. Patient not taking: Reported on 05/03/2020 12/15/19   [provider]  Multiple Vitamin (MULTIVITAMIN) tablet Take 1 tablet by mouth daily. Patient not taking: Reported on 05/03/2020 07/26/14   Larey Dresser, MD    Physical Exam: Constitutional: Moderately built and nourished. Vitals:   05/03/20 1555 05/03/20 1630 05/03/20 1901 05/03/20 2033  BP:  (!) 95/50  94/63 98/77  Pulse: 75  82 80  Resp: (!) 27  20 17   Temp: 97.7 F (36.5 C)     TempSrc: Oral     SpO2: 100% 100% 99% 97%   Eyes: Anicteric no pallor. ENMT: No discharge from the ears eyes nose or mouth. Neck: No mass felt.  No neck rigidity. Respiratory: No rhonchi or crepitations. Cardiovascular: S1-S2 heard. Abdomen: Soft nontender bowel sounds present. Musculoskeletal: No edema. Skin: No rash. Neurologic: Alert awake oriented to name only.  Moves all extremities. Psychiatric: Oriented to name only.   Labs on Admission: I have personally reviewed following labs and imaging studies  CBC: Recent Labs  Lab 05/03/20 1718  WBC 24.7*  NEUTROABS 22.7*  HGB 8.5*  HCT 29.2*  MCV 70.7*  PLT  778*   Basic Metabolic Panel: Recent Labs  Lab 05/03/20 1718  NA 135  K 3.5  CL 100  CO2 21*  GLUCOSE 129*  BUN 99*  CREATININE 3.09*  CALCIUM 9.0   GFR: CrCl cannot be calculated (Unknown ideal weight.). Liver Function Tests: Recent Labs  Lab 05/03/20 1718  AST 40  ALT 22  ALKPHOS 155*  BILITOT 0.7  PROT 6.0*  ALBUMIN 2.7*   No results for input(s): LIPASE, AMYLASE in the last 168 hours. No results for input(s): AMMONIA in the last 168 hours. Coagulation Profile: No results for input(s): INR, PROTIME in the last 168 hours. Cardiac Enzymes: No results for input(s): CKTOTAL, CKMB, CKMBINDEX, TROPONINI in the last 168 hours. BNP (last 3 results) No results for input(s): PROBNP in the last 8760 hours. HbA1C: No results for input(s): HGBA1C in the last 72 hours. CBG: No results for input(s): GLUCAP in the last 168 hours. Lipid Profile: No results for input(s): CHOL, HDL, LDLCALC, TRIG, CHOLHDL, LDLDIRECT in the last 72 hours. Thyroid Function Tests: No results for input(s): TSH, T4TOTAL, FREET4, T3FREE, THYROIDAB in the last 72 hours. Anemia Panel: No results for input(s): VITAMINB12, FOLATE, FERRITIN, TIBC, IRON, RETICCTPCT in the last 72 hours. Urine  analysis:    Component Value Date/Time   COLORURINE YELLOW 05/03/2020 2139   APPEARANCEUR TURBID (A) 05/03/2020 2139   LABSPEC 1.010 05/03/2020 2139   PHURINE 6.0 05/03/2020 2139   GLUCOSEU NEGATIVE 05/03/2020 2139   HGBUR MODERATE (A) 05/03/2020 2139   BILIRUBINUR NEGATIVE 05/03/2020 2139   [place] NEGATIVE 05/03/2020 2139   PROTEINUR 30 (A) 05/03/2020 2139   UROBILINOGEN 0.2 09/01/2015 1254   NITRITE NEGATIVE 05/03/2020 2139   LEUKOCYTESUR LARGE (A) 05/03/2020 2139   Sepsis Labs: @LABRCNTIP (procalcitonin:4,lacticidven:4) ) Recent Results (from the past 240 hour(s))  SARS Coronavirus 2 by RT PCR (hospital order, performed in [place] hospital lab) Nasopharyngeal Nasopharyngeal Swab     Status: None   Collection Time: 05/03/20 10:38 PM   Specimen: Nasopharyngeal Swab  Result Value Ref Range Status   SARS Coronavirus 2 NEGATIVE NEGATIVE Final    Comment: (NOTE) SARS-CoV-2 target nucleic acids are NOT DETECTED.  The SARS-CoV-2 RNA is generally detectable in upper and lower respiratory specimens during the acute phase of infection. The lowest concentration of SARS-CoV-2 viral copies this assay can detect is 250 copies / mL. A negative result does not preclude SARS-CoV-2 infection and should not be used as the sole basis for treatment or other patient management decisions.  A negative result may occur with improper specimen collection / handling, submission of specimen other than nasopharyngeal swab, presence of viral mutation(s) within the areas targeted by this assay, and inadequate number of viral copies (<250 copies / mL). A negative result must be combined with clinical observations, patient history, and epidemiological information.  Fact Sheet for Patients:   StrictlyIdeas.no  Fact Sheet for Healthcare Providers: BankingDealers.co.za  This test is not yet approved or  cleared by the [place] FDA and has been  authorized for detection and/or diagnosis of SARS-CoV-2 by FDA under an Emergency Use Authorization (EUA).  This EUA will remain in effect (meaning this test can be used) for the duration of the COVID-19 declaration under Section 564(b)(1) of the Act, 21 U.S.C. section 360bbb-3(b)(1), unless the authorization is terminated or revoked sooner.  Performed at [facility], [place] [address], [place], [place] [address]      Radiological Exams on Admission: [place]  Result Date: 05/03/2020 CLINICAL DATA:  86 year old female with nausea vomiting. EXAM: CT ABDOMEN AND PELVIS WITHOUT CONTRAST TECHNIQUE: Multidetector CT imaging of the abdomen and pelvis was performed following the standard protocol without IV contrast. COMPARISON:  CT of the abdomen pelvis dated 01/08/2016. FINDINGS: Evaluation of this exam is limited in the absence of intravenous contrast. Evaluation is also limited due to streak artifact caused by patient's arms. Lower chest: There are bibasilar interstitial coarsening and nodularity which may be chronic. Atypical pneumonia or aspiration is not excluded. Clinical correlation is recommended. There is cardiomegaly with advanced multi vessel coronary vascular calcification. No intra-abdominal free air or free fluid. Hepatobiliary: Innumerable hepatic hypodense masses with the largest confluent of mass measuring 7.0 x 4.6 cm most consistent with metastatic disease. No intrahepatic biliary ductal dilatation. There are multiple stones within the gallbladder. No pericholecystic fluid or evidence of acute cholecystitis by CT. Pancreas: Unremarkable. No pancreatic ductal dilatation or surrounding inflammatory changes. Spleen: Normal in size without focal abnormality. Adrenals/Urinary Tract: The adrenal glands are unremarkable. There is no hydronephrosis on either side. Linear calcific density in the upper pole of the left kidney likely represents vascular calcification and  less likely nonobstructing stone. The urinary bladder is grossly unremarkable. Stomach/Bowel: There is sigmoid diverticulosis and scattered colonic diverticula. There is inflammatory changes centered at a proximal transverse colon diverticula (64/6 and 40/3) consistent with acute diverticulitis. No evidence of abscess or perforation. There is no bowel obstruction. The appendix is not visualized with certainty. No inflammatory changes identified in the right lower quadrant. Vascular/Lymphatic: There is advanced aortoiliac atherosclerotic disease. There is a 4.2 cm fusiform infrarenal abdominal aortic aneurysm. Evaluation of the abdominal aorta is very limited on this noncontrast CT. The aortic aneurysm however appears larger compared to the prior CT. There is apparent area of discontinuity in the atherosclerotic calcification of the abdominal aorta with tissue density external to the margin of the aortic wall (40/3). This likely corresponds to the penetrating ulcer seen on the prior CT. However, aortic leak is not excluded. CT with IV contrast is recommended for better evaluation. The IVC is unremarkable. No portal venous gas. No adenopathy. Reproductive: The uterus is grossly unremarkable. There is a 12 mm right ovarian cystic structure. Other: None Musculoskeletal: Osteopenia with degenerative changes of the spine. There is a total left hip arthroplasty. There is age indeterminate compression fracture of L4 with approximately 25% loss of vertebral body height, possibly acute or subacute. Clinical correlation is recommended. No retropulsed fragment. IMPRESSION: 1. Acute diverticulitis of the proximal transverse colon. No abscess or perforation. 2. Innumerable hepatic hypodense masses most consistent with metastatic disease. 3. Cholelithiasis. 4. Age indeterminate L4 compression fracture, possibly acute or subacute. Clinical correlation is recommended. 5. A 4.2 cm infrarenal abdominal aortic aneurysm, increased in  size since the prior CT. Further evaluation with CT with IV contrast recommended. 6. Aortic Atherosclerosis (ICD10-I70.0). Electronically Signed   By: Anner Crete M.D.   On: 05/03/2020 20:05   DG Chest Portable 1 View  Result Date: 05/03/2020 CLINICAL DATA:  Weakness. EXAM: PORTABLE CHEST 1 VIEW COMPARISON:  March 30, 2020 FINDINGS: There is stable left-sided venous catheter positioning. Mild diffuse chronic appearing increased lung markings are seen with mild areas of superimposed atelectasis and/or infiltrate noted within the right upper lobe and left lung base. There is no evidence of a pleural effusion or pneumothorax. The cardiac silhouette is markedly enlarged. There is moderate severity calcification of the thoracic aorta. Moderate to marked severity degenerative changes  seen involving both shoulders and throughout the thoracic spine. IMPRESSION: Chronic appearing increased lung markings with mild superimposed atelectasis and/or infiltrate within the right upper lobe and left lung base. Electronically Signed   By: Virgina Norfolk M.D.   On: 05/03/2020 17:27    EKG: Independently reviewed.  Sinus rhythm LBBB.  Assessment/Plan Active Problems:   Hypertension   AAA (abdominal aortic aneurysm) ([facility])   Atrial fibrillation ([facility])   Chronic anemia   Pulmonary hypertension ([facility])   Acute diverticulitis   AKI (acute kidney injury) ([place])    1. Possible developing sepsis from acute diverticulitis with possible pneumonia for which patient is on empiric antibiotics follow cultures.  Patient did receive 1 L fluid in the ER.  Will hold off further fluids given the CHF. 2. Acute on chronic and disease stage III likely from poor oral intake and vomiting.  Will hold off patient's diuretics for now.  Patient received 1 L fluid in the ER.  Will hold off further fluids given the CHF.  Follow intake output metabolic panel. 3. Possible metastatic lesions seen in the liver in the CAT scan.  Will need to  discuss with family for further work-up. 4. History of chronic ischemic/nonischemic cardiomyopathy with EF of 25% on milrinone which will be continued.  Holding of diuretic due to worsening renal function. 5. History of A. fib on amiodarone and apixaban.  If patient cannot tolerate any more further oral medications and if vomiting persist may have to change to a heparin. 6. L4 compression fracture will need to get opinion from orthopedics in the morning. 7. Chronic anemia follow CBC. 8. History of abdominal aortic aneurysm slightly enlarged from previous.  We need to get opinion from patient's cardiologist in the morning. 9. Dementia presently no acute issues.  Given that patient has possible sepsis with diverticulitis and pneumonia will need close monitoring for any further worsening in inpatient status.   DVT prophylaxis: Apixaban. Code Status: DNR as confirmed with patient's son with the ER physician. Family Communication: ER physician discussed with patient's son will need to contact in the morning again. Disposition Plan: To be determined. Consults called: Palliative care. Admission status: Inpatient.   Rise Patience MD [facility] Pager [phone].  If 7PM-7AM, please contact night-coverage [email or web address] Password [facility]  05/03/2020, 11:53 PM

## 2020-05-03 NOTE — ED Triage Notes (Signed)
BIB [facility] with c/o diarrhea, weakness, lethargy x 4 weeks. Care giver reports decrease P.O intake as well. PT had one episode of emesis while in transport.  HX of dementia, and heart failure. PT on 4 L [place] at home. Upon arrival B/P 97/50, HR 75 Resp. [place]

## 2020-05-03 NOTE — ED Notes (Signed)
Transported to CT 

## 2020-05-04 DIAGNOSIS — D649 Anemia, unspecified: Secondary | ICD-10-CM

## 2020-05-04 LAB — BASIC METABOLIC PANEL
Anion gap: 16 — ABNORMAL HIGH (ref 5–15)
BUN: 93 mg/dL — ABNORMAL HIGH (ref 8–23)
CO2: 18 mmol/L — ABNORMAL LOW (ref 22–32)
Calcium: 8.7 mg/dL — ABNORMAL LOW (ref 8.9–10.3)
Chloride: 105 mmol/L (ref 98–111)
Creatinine, Ser: 2.93 mg/dL — ABNORMAL HIGH (ref 0.44–1.00)
GFR calc Af Amer: 15 mL/min — ABNORMAL LOW (ref >60)
GFR calc non Af Amer: 13 mL/min — ABNORMAL LOW (ref >60)
Glucose, Bld: 118 mg/dL — ABNORMAL HIGH (ref 70–99)
Potassium: 3.6 mmol/L (ref 3.5–5.1)
Sodium: 139 mmol/L (ref 135–145)

## 2020-05-04 LAB — CBC
HCT: 28.5 % — ABNORMAL LOW (ref 36.0–46.0)
Hemoglobin: 8.3 g/dL — ABNORMAL LOW (ref 12.0–15.0)
MCH: 20.9 pg — ABNORMAL LOW (ref 26.0–34.0)
MCHC: 29.1 g/dL — ABNORMAL LOW (ref 30.0–36.0)
MCV: 71.8 fL — ABNORMAL LOW (ref 80.0–100.0)
Platelets: 386 10*3/uL (ref 150–400)
RBC: 3.97 MIL/uL (ref 3.87–5.11)
RDW: 18.5 % — ABNORMAL HIGH (ref 11.5–15.5)
WBC: 19.8 10*3/uL — ABNORMAL HIGH (ref 4.0–10.5)
nRBC: 0 % (ref 0.0–0.2)

## 2020-05-04 LAB — CBG MONITORING, ED
Glucose-Capillary: 119 mg/dL — ABNORMAL HIGH (ref 70–99)
Glucose-Capillary: 143 mg/dL — ABNORMAL HIGH (ref 70–99)

## 2020-05-04 MED ORDER — VANCOMYCIN HCL IN DEXTROSE 1-5 GM/200ML-% IV SOLN
1000.0000 mg | Freq: Once | INTRAVENOUS | Status: AC
  Administered 2020-05-04: 1000 mg via INTRAVENOUS
  Filled 2020-05-04: qty 200

## 2020-05-04 MED ORDER — MILRINONE LACTATE IN DEXTROSE 20-5 MG/100ML-% IV SOLN
0.1250 ug/kg/min | INTRAVENOUS | Status: DC
  Administered 2020-05-04 – 2020-05-08 (×3): 0.125 ug/kg/min via INTRAVENOUS
  Filled 2020-05-04 (×4): qty 100

## 2020-05-04 MED ORDER — PIPERACILLIN-TAZOBACTAM IN DEX 2-0.25 GM/50ML IV SOLN
2.2500 g | Freq: Three times a day (TID) | INTRAVENOUS | Status: DC
  Administered 2020-05-04 – 2020-05-07 (×11): 2.25 g via INTRAVENOUS
  Filled 2020-05-04 (×13): qty 50

## 2020-05-04 MED ORDER — ACETAMINOPHEN 325 MG PO TABS
650.0000 mg | ORAL_TABLET | Freq: Four times a day (QID) | ORAL | Status: DC | PRN
  Administered 2020-05-06 – 2020-05-10 (×5): 650 mg via ORAL
  Filled 2020-05-04 (×5): qty 2

## 2020-05-04 MED ORDER — VANCOMYCIN VARIABLE DOSE PER UNSTABLE RENAL FUNCTION (PHARMACIST DOSING): Status: DC

## 2020-05-04 NOTE — ED Notes (Signed)
Ordered breakfast--Antigone Crowell 

## 2020-05-04 NOTE — Progress Notes (Signed)
  NEUROSURGERY PROGRESS NOTE   Received call from Dr Horris Latino regarding patient.  86 year old female who presented to ED yesterday with abdominal pain and back pain, admitted ultimately for sepsis secondary to diverticulitis, ?aspiration PNA and AKI. In addition, noted to have L4 compression. We were called for recommendations regarding the fracture.  CT abd/pelvis reviewed as it pertains to the spine. There is an L4 compression fracture with 25% height loss, no retropulsion. This is seen on L spine Xray 04/26/2020 and is unchanged in apperance. There is no role for NS intervention. In fact given advanced age/comorbidities, we would not recommend surgery even if this were to worsen. Tx conservatively with LSO which is to be worn when upright and OOB. Can f/u outpatient in 4-6 weeks for monitoring. Please call for any concerns.  Ferne Reus, PA-C [facility] [employer]

## 2020-05-04 NOTE — Progress Notes (Signed)
Pharmacy Antibiotic Note  BRIELLA HOBDAY is a 86 y.o. female admitted on 05/03/2020 with sepsis d/t acute diverticulitis and possible pneumonia. Pharmacy has been consulted for Zosyn dosing and now adding on vancomycin. Pt w/ AKI. Scr is 2.93 with normal baseline ~1.2. Pt is afebrile but WBC is elevated.   Plan: Continue zosyn as ordered Vancomycin 1gm IV x 1 then will trend Scr for further doses F/u renal fxn, C&S, clinical status and trough at SS  Height: 4\' 9"  (144.8 cm) Weight: 45.9 kg (101 lb 3.1 oz) IBW/kg (Calculated) : 38.6  Temp (24hrs), Avg:97.7 F (36.5 C), Min:97.7 F (36.5 C), Max:97.7 F (36.5 C)  Recent Labs  Lab 05/03/20 1718 05/03/20 1755 05/03/20 2024 05/04/20 0415  WBC 24.7*  --   --  19.8*  CREATININE 3.09*  --   --  2.93*  LATICACIDVEN  --  1.1 0.9  --     Estimated Creatinine Clearance: 7.2 mL/min (A) (by C-G formula based on SCr of 2.93 mg/dL (H)).    Allergies  Allergen Reactions  . Tape Other (See Comments)    SKIN WILL TEAR EASILY!!!!  . Codeine Nausea Only  . Colchicine Diarrhea  . Garlic Nausea Only   Salome Arnt, PharmD, BCPS Clinical Pharmacist Please see AMION for all pharmacy numbers 05/04/2020 7:46 AM

## 2020-05-04 NOTE — Progress Notes (Signed)
PROGRESS NOTE  DORICE STIGGERS KVQ:[id number] DOB: 08-28-1924 DOA: 05/03/2020 PCP: [name], MD  HPI/Recap of past 24 hours: HPI from Dr [name] is a 86 y.o. female with history of chronic mixed ischemic/nonischemic cardiomyopathy last EF was 25% with moderate right ventricular systolic dysfunction on milrinone, atrial fibrillation on amiodarone apixaban with dementia, anemia, abdominal aortic aneurysm was brought to the ER the patient had been having nausea vomiting poor oral intake for the last few days.  Patient also mildly lethargic.  Patient unable to provide any history due to dementia.  Most of the history was obtained from the ER physician who discussed with patient's healthcare power of attorney and also patient's son. In the ER patient had low normal blood pressure in the 64P systolic.  Lab work is significant for acute renal failure and leukocytosis.  Creatinine has worsened from 1.2 in December 2020 and is around 3 and WBC count is 24.7 with a hemoglobin of 8.5.  Chest x-ray shows possible infiltrates.  CT abdomen pelvis is showing possible acute diverticulitis involving the proximal transverse colon.  In addition there is concern for possible metastatic lesions in the liver.  L4 compression fracture also.  Patient was started on empiric antibiotics.  Initially patient was given fluid for 1L for the acute renal failure.  Patient admitted for further management.     Today, saw patient at bedside, unable to communicate effectively.  Unable to perform ROS.   Assessment/Plan: Active Problems:   Hypertension   AAA (abdominal aortic aneurysm) (HCC)   Atrial fibrillation (HCC)   Chronic anemia   Pulmonary hypertension (HCC)   Acute diverticulitis   AKI (acute kidney injury) ([place])   Sepsis likely 2/2 acute diverticulitis Noted leukocytosis, tachypnea on admission Currently afebrile, with leukocytosis CT abdomen/pelvis showed acute diverticulitis of the proximal  transverse colon, no abscess or perforation noted Status post IV fluids, will hold off due to history of cardiomyopathy Continue Zosyn Monitor closely  Possible aspiration pneumonia History of coughing and vomiting recently Chest x-ray with possible infiltrate within the right upper lobe and left lung base SLP ordered Full liquid diet for now Continue IV Zosyn  AKI on CKD stage IIIb/metabolic acidosis/anion gap Baseline creatinine 1.2-1.4, on admission 3.09 Status post gentle hydration Hold home diuretics Daily BMP  Anemia of chronic kidney disease Baseline hemoglobin around 10, on admission 8.5 No obvious signs of bleeding Anemia panel pending Daily CBC  History of chronic cardiomyopathy/chronic systolic HF Chronic hypoxic respiratory failure Appears dry Last echo with EF of 25% Discussed with Dr. [name] HF, recommend continuing milrinone for now and holding off all diuretics Continue milrinone  History of paroxysmal A. Fib Heart rate controlled Hold home amiodarone for now Continue home Eliquis  Possible metastatic lesions in liver CT abdomen showed innumerable hepatic hypodense masses consistent with metastatic disease Plan to discuss with family, outpatient follow-up if aggressive management is pursued  L4 compression fracture CT abdomen/pelvis showed L4 compression fracture possibly acute or subacute Notified orthopedics Pain management  History of AAA 4.2 cm infrarenal abdominal aortic aneurysm increased in size To further evaluate CT with IV contrast recommended, patient with AKI, can be pursued as an outpatient  Dementia Delirium precautions  Goals of care discussion Patient with very poor prognosis, advanced age, multiple comorbidities Palliative consulted        Malnutrition Type:      Malnutrition Characteristics:      Nutrition Interventions:       Estimated body  mass index is 21.9 kg/m as calculated from the following:    Height as of this encounter: 4\' 9"  (1.448 m).   Weight as of this encounter: 45.9 kg.     Code Status: DNR   Family Communication: Discussed with nephew Ihor Austin and niece Aura Camps on 05/04/2020  Disposition Plan: Status is: Inpatient  Remains inpatient appropriate because:Inpatient level of care appropriate due to severity of illness   Dispo: The patient is from: Home (has 24-hour care)              Anticipated d/c is to: Likely home              Anticipated d/c date is: 3 days              Patient currently is not medically stable to d/c.    Consultants:  None  Procedures:  None  Antimicrobials:  Zosyn  DVT prophylaxis: Eliquis   Objective: Vitals:   05/04/20 0630 05/04/20 0645 05/04/20 0659 05/04/20 0709  BP: (!) 91/47 (!) 92/50 (!) 92/53   Pulse: 68 67 65   Resp: (!) 23 (!) 31  (!) 24  Temp:      TempSrc:      SpO2: 98% 98% 99%   Weight:      Height:        Intake/Output Summary (Last 24 hours) at 05/04/2020 1022 Last data filed at 05/04/2020 0942 Gross per 24 hour  Intake 114.58 ml  Output 1200 ml  Net -1085.42 ml   Filed Weights   05/04/20 0000  Weight: 45.9 kg    Exam:  General: NAD, pleasantly confused  Cardiovascular: S1, S2 present  Respiratory: CTAB  Abdomen: Soft, +tender, nondistended, bowel sounds present  Musculoskeletal: No bilateral pedal edema noted  Skin: Normal  Psychiatry:  Unable to assess    Data Reviewed: CBC: Recent Labs  Lab 05/03/20 1718 05/04/20 0415  WBC 24.7* 19.8*  NEUTROABS 22.7*  --   HGB 8.5* 8.3*  HCT 29.2* 28.5*  MCV 70.7* 71.8*  PLT 478* 790   Basic Metabolic Panel: Recent Labs  Lab 05/03/20 1718 05/04/20 0415  NA 135 139  K 3.5 3.6  CL 100 105  CO2 21* 18*  GLUCOSE 129* 118*  BUN 99* 93*  CREATININE 3.09* 2.93*  CALCIUM 9.0 8.7*   GFR: Estimated Creatinine Clearance: 7.2 mL/min (A) (by C-G formula based on SCr of 2.93 mg/dL (H)). Liver Function Tests: Recent Labs  Lab  05/03/20 1718  AST 40  ALT 22  ALKPHOS 155*  BILITOT 0.7  PROT 6.0*  ALBUMIN 2.7*   No results for input(s): LIPASE, AMYLASE in the last 168 hours. No results for input(s): AMMONIA in the last 168 hours. Coagulation Profile: No results for input(s): INR, PROTIME in the last 168 hours. Cardiac Enzymes: No results for input(s): CKTOTAL, CKMB, CKMBINDEX, TROPONINI in the last 168 hours. BNP (last 3 results) No results for input(s): PROBNP in the last 8760 hours. HbA1C: No results for input(s): HGBA1C in the last 72 hours. CBG: Recent Labs  Lab 05/04/20 0040 05/04/20 0806  GLUCAP 143* 119*   Lipid Profile: No results for input(s): CHOL, HDL, LDLCALC, TRIG, CHOLHDL, LDLDIRECT in the last 72 hours. Thyroid Function Tests: No results for input(s): TSH, T4TOTAL, FREET4, T3FREE, THYROIDAB in the last 72 hours. Anemia Panel: No results for input(s): VITAMINB12, FOLATE, FERRITIN, TIBC, IRON, RETICCTPCT in the last 72 hours. Urine analysis:    Component Value Date/Time   COLORURINE  YELLOW 05/03/2020 2139   APPEARANCEUR TURBID (A) 05/03/2020 2139   LABSPEC 1.010 05/03/2020 2139   PHURINE 6.0 05/03/2020 2139   GLUCOSEU NEGATIVE 05/03/2020 2139   HGBUR MODERATE (A) 05/03/2020 2139   BILIRUBINUR NEGATIVE 05/03/2020 2139   [place] NEGATIVE 05/03/2020 2139   PROTEINUR 30 (A) 05/03/2020 2139   UROBILINOGEN 0.2 09/01/2015 1254   NITRITE NEGATIVE 05/03/2020 2139   LEUKOCYTESUR LARGE (A) 05/03/2020 2139   Sepsis Labs: @LABRCNTIP (procalcitonin:4,lacticidven:4)  ) Recent Results (from the past 240 hour(s))  Blood culture (routine x 2)     Status: None (Preliminary result)   Collection Time: 05/03/20  8:24 PM   Specimen: BLOOD  Result Value Ref Range Status   Specimen Description BLOOD RIGHT WRIST  Final   Special Requests   Final    BOTTLES DRAWN AEROBIC AND ANAEROBIC Blood Culture results may not be optimal due to an inadequate volume of blood received in culture bottles    Culture   Final    NO GROWTH < 12 HOURS Performed at [facility], [place] [address] [place], [place] [address]    Report Status PENDING  Incomplete  SARS Coronavirus 2 by RT PCR (hospital order, performed in [facility] hospital lab) Nasopharyngeal Nasopharyngeal Swab     Status: None   Collection Time: 05/03/20 10:38 PM   Specimen: Nasopharyngeal Swab  Result Value Ref Range Status   SARS Coronavirus 2 NEGATIVE NEGATIVE Final    Comment: (NOTE) SARS-CoV-2 target nucleic acids are NOT DETECTED.  The SARS-CoV-2 RNA is generally detectable in upper and lower respiratory specimens during the acute phase of infection. The lowest concentration of SARS-CoV-2 viral copies this assay can detect is 250 copies / mL. A negative result does not preclude SARS-CoV-2 infection and should not be used as the sole basis for treatment or other patient management decisions.  A negative result may occur with improper specimen collection / handling, submission of specimen other than nasopharyngeal swab, presence of viral mutation(s) within the areas targeted by this assay, and inadequate number of viral copies (<250 copies / mL). A negative result must be combined with clinical observations, patient history, and epidemiological information.  Fact Sheet for Patients:   StrictlyIdeas.no  Fact Sheet for Healthcare Providers: BankingDealers.co.za  This test is not yet approved or  cleared by the [place] FDA and has been authorized for detection and/or diagnosis of SARS-CoV-2 by FDA under an Emergency Use Authorization (EUA).  This EUA will remain in effect (meaning this test can be used) for the duration of the COVID-19 declaration under Section 564(b)(1) of the Act, 21 U.S.C. section 360bbb-3(b)(1), unless the authorization is terminated or revoked sooner.  Performed at [facility], [place] [address] [place], [place] [address]        Studies: CT ABDOMEN PELVIS WO CONTRAST  Result Date: 05/03/2020 CLINICAL DATA:  86 year old female with nausea vomiting. EXAM: CT ABDOMEN AND PELVIS WITHOUT CONTRAST TECHNIQUE: Multidetector CT imaging of the abdomen and pelvis was performed following the standard protocol without IV contrast. COMPARISON:  CT of the abdomen pelvis dated 01/08/2016. FINDINGS: Evaluation of this exam is limited in the absence of intravenous contrast. Evaluation is also limited due to streak artifact caused by patient's arms. Lower chest: There are bibasilar interstitial coarsening and nodularity which may be chronic. Atypical pneumonia or aspiration is not excluded. Clinical correlation is recommended. There is cardiomegaly with advanced multi vessel coronary vascular calcification. No intra-abdominal free air or free fluid. Hepatobiliary: Innumerable hepatic  hypodense masses with the largest confluent of mass measuring 7.0 x 4.6 cm most consistent with metastatic disease. No intrahepatic biliary ductal dilatation. There are multiple stones within the gallbladder. No pericholecystic fluid or evidence of acute cholecystitis by CT. Pancreas: Unremarkable. No pancreatic ductal dilatation or surrounding inflammatory changes. Spleen: Normal in size without focal abnormality. Adrenals/Urinary Tract: The adrenal glands are unremarkable. There is no hydronephrosis on either side. Linear calcific density in the upper pole of the left kidney likely represents vascular calcification and less likely nonobstructing stone. The urinary bladder is grossly unremarkable. Stomach/Bowel: There is sigmoid diverticulosis and scattered colonic diverticula. There is inflammatory changes centered at a proximal transverse colon diverticula (64/6 and 40/3) consistent with acute diverticulitis. No evidence of abscess or perforation. There is no bowel obstruction. The appendix is not visualized with certainty. No inflammatory changes identified in the  right lower quadrant. Vascular/Lymphatic: There is advanced aortoiliac atherosclerotic disease. There is a 4.2 cm fusiform infrarenal abdominal aortic aneurysm. Evaluation of the abdominal aorta is very limited on this noncontrast CT. The aortic aneurysm however appears larger compared to the prior CT. There is apparent area of discontinuity in the atherosclerotic calcification of the abdominal aorta with tissue density external to the margin of the aortic wall (40/3). This likely corresponds to the penetrating ulcer seen on the prior CT. However, aortic leak is not excluded. CT with IV contrast is recommended for better evaluation. The IVC is unremarkable. No portal venous gas. No adenopathy. Reproductive: The uterus is grossly unremarkable. There is a 12 mm right ovarian cystic structure. Other: None Musculoskeletal: Osteopenia with degenerative changes of the spine. There is a total left hip arthroplasty. There is age indeterminate compression fracture of L4 with approximately 25% loss of vertebral body height, possibly acute or subacute. Clinical correlation is recommended. No retropulsed fragment. IMPRESSION: 1. Acute diverticulitis of the proximal transverse colon. No abscess or perforation. 2. Innumerable hepatic hypodense masses most consistent with metastatic disease. 3. Cholelithiasis. 4. Age indeterminate L4 compression fracture, possibly acute or subacute. Clinical correlation is recommended. 5. A 4.2 cm infrarenal abdominal aortic aneurysm, increased in size since the prior CT. Further evaluation with CT with IV contrast recommended. 6. Aortic Atherosclerosis (ICD10-I70.0). Electronically Signed   By: Anner Crete M.D.   On: 05/03/2020 20:05   DG Chest Portable 1 View  Result Date: 05/03/2020 CLINICAL DATA:  Weakness. EXAM: PORTABLE CHEST 1 VIEW COMPARISON:  March 30, 2020 FINDINGS: There is stable left-sided venous catheter positioning. Mild diffuse chronic appearing increased lung markings are  seen with mild areas of superimposed atelectasis and/or infiltrate noted within the right upper lobe and left lung base. There is no evidence of a pleural effusion or pneumothorax. The cardiac silhouette is markedly enlarged. There is moderate severity calcification of the thoracic aorta. Moderate to marked severity degenerative changes seen involving both shoulders and throughout the thoracic spine. IMPRESSION: Chronic appearing increased lung markings with mild superimposed atelectasis and/or infiltrate within the right upper lobe and left lung base. Electronically Signed   By: Virgina Norfolk M.D.   On: 05/03/2020 17:27    Scheduled Meds: . allopurinol  100 mg Oral BID  . amiodarone  100 mg Oral Daily  . apixaban  2.5 mg Oral BID  . busPIRone  5 mg Oral BID  . OLANZapine  2.5 mg Oral Q supper  . omega-3 acid ethyl esters  1 g Oral Daily  . vancomycin variable dose per unstable renal function (pharmacist dosing)   Does  not apply See admin instructions    Continuous Infusions: . milrinone 0.125 mcg/kg/min (05/04/20 0942)  . piperacillin-tazobactam (ZOSYN)  IV Stopped (05/04/20 0550)     LOS: 1 day     Alma Friendly, MD [facility]  If 7PM-7AM, please contact night-coverage [email or web address] 05/04/2020, 10:22 AM

## 2020-05-04 NOTE — Progress Notes (Signed)
Orthopedic Tech Progress Note Patient Details:  Jacqueline Reilly 03-Jan-1924 [id number] Ordered outside vendor brace Patient ID: Jacqueline Reilly, female   DOB: [birth year], 86 y.o.   MRN: [id number]   Tammy Sours 05/04/2020, 5:11 PM

## 2020-05-04 NOTE — TOC Initial Note (Addendum)
Transition of Care [facility]) - Initial/Assessment Note    Patient Details  Name: Jacqueline Reilly MRN: [id number] Date of Birth: 1924-02-16  [facility]) CM/SW Contact:    Verdell Carmine, RN Phone Number: 05/04/2020, 3:16 PM  Clinical Narrative:                 Patient comes from home where she had been on home milrinone therapy for a EF of 20-25%.Via PICC linea f month ago when she was more confused and her PICC kept coming out. At that time( June) Dr Hayden Pedro saw her and  They discontinued the therapy. She has been at home on 4 LPM of oxygen. She had a UTI and was on antibiotics. She started having loose stools and then vomiting . Her CT shows acute diverticulitis. At this time she is being treated with bowel rest, She got a bolus of IV fluids, but with her CHF and EF , she is getting very gentle hydration. She is on zosyn and vancomycin. Will continue to follow for home health /other needs  consider palliative consult for GOC given history of low EF, as well as questionable lesions on liver CT scan that  Need to be assessed.  Addendum: She is active with [facility] Expected Discharge Plan: [place] Barriers to Discharge: Continued Medical Work up   Patient Goals and CMS Choice        Expected Discharge Plan and Services Expected Discharge Plan: [place]   Discharge Planning Services: CM Consult   Living arrangements for the past 2 months: Single Family Home                                      Prior Living Arrangements/Services Living arrangements for the past 2 months: Single Family Home Lives with:: Relatives Patient language and need for interpreter reviewed:: Yes        Need for Family Participation in Patient Care: Yes (Comment) Care giver support system in place?: Yes (comment)   Criminal Activity/Legal Involvement Pertinent to Current Situation/Hospitalization: No - Comment as needed  Activities of Daily  Living      Permission Sought/Granted      Share Information with NAME: Freddie Nephew           Emotional Assessment Appearance:: Appears stated age     Orientation: : Fluctuating Orientation (Suspected and/or reported [place])   Psych Involvement: No (comment)  Admission diagnosis:  AKI (acute kidney injury) ([place]) [N17.9] Acute diverticulitis [K57.92] Sepsis, due to unspecified organism, unspecified whether acute organ dysfunction present [facility]) [A41.9] Patient Active Problem List   Diagnosis Date Noted  . Acute diverticulitis 05/03/2020  . AKI (acute kidney injury) ([place]) 05/03/2020  . Sepsis ([place])   . Pulmonary hypertension ([place]) 08/02/2017  . SIRS (systemic inflammatory response syndrome) ([place]) 05/27/2017  . Acute blood loss anemia 05/27/2017  . Anxiety 05/27/2017  . CKD (chronic kidney disease), stage IV ([place]) 05/27/2017  . Heart failure with reduced ejection fraction ([place]) 04/05/2017  . Receiving inotropic medication 04/05/2017  . Hypokalemia 04/05/2017  . Malnutrition of moderate degree 03/05/2017  . Left displaced femoral neck fracture ([place]) 03/04/2017  . Leukocytosis 03/04/2017  . Hypoxia   . Gram-negative bacteremia 09/01/2015  . Chronic anemia 09/01/2015  . Persistent atrial fibrillation ([place])   . Acute on chronic systolic CHF (congestive heart failure) ([place]) 06/21/2015  .  Atrial fibrillation ([place]) 08/10/2014  . Physical deconditioning 05/26/2014  . Acute respiratory failure with hypoxia ([place]) 05/08/2014  . NSTEMI, initial episode of care ([place]) 05/07/2014  . Atrial fibrillation with rapid ventricular response ([place]) 05/07/2014  . NSTEMI (non-ST elevated myocardial infarction) ([place]) 05/07/2014  . Abnormal chest x-ray 04/24/2014  . CHF (congestive heart failure) ([place]) 04/23/2014  . Acute on chronic combined systolic and diastolic congestive heart failure ([place]) 04/23/2014  . AAA (abdominal aortic aneurysm) ([place]) 10/07/2013  . SOB (shortness of breath)  09/30/2013  . Coronary artery disease   . Ischemic dilated cardiomyopathy ([place])   . Hypertension    PCP:  [name], MD Pharmacy:   [facility], [place] [address] Phone: [phone] Fax: [phone]  [facility], [place] [address] [place] [address] Phone: [phone] Fax: [phone]     Social Determinants of Health (SDOH) Interventions    Readmission Risk Interventions No flowsheet data found.

## 2020-05-04 NOTE — Progress Notes (Addendum)
Pharmacy Antibiotic Note  Jacqueline Reilly is a 86 y.o. female admitted on 05/03/2020 with sepsis d/t acute diverticulitis.  Pharmacy has been consulted for Zosyn dosing.  Pt w/ AKI; current SCr 3.09, baseline ~1.2.  Plan: Rec'd Zosyn 3.375g IV in ED; continue with Zosyn 2.25g IV Q8H and monitor SCr for dose adjustments.  Height: 4\' 9"  (144.8 cm) Weight: 45.9 kg (101 lb 3.1 oz) IBW/kg (Calculated) : 38.6  Temp (24hrs), Avg:97.7 F (36.5 C), Min:97.7 F (36.5 C), Max:97.7 F (36.5 C)  Recent Labs  Lab 05/03/20 1718 05/03/20 1755 05/03/20 2024  WBC 24.7*  --   --   CREATININE 3.09*  --   --   LATICACIDVEN  --  1.1 0.9    Estimated Creatinine Clearance: 6.8 mL/min (A) (by C-G formula based on SCr of 3.09 mg/dL (H)).    Allergies  Allergen Reactions  . Tape Other (See Comments)    SKIN WILL TEAR EASILY!!!!  . Codeine Nausea Only  . Colchicine Diarrhea  . Garlic Nausea Only     Thank you for allowing pharmacy to be a part of this patient's care.  Wynona Neat, PharmD, BCPS  05/04/2020 12:57 AM

## 2020-05-05 DIAGNOSIS — I480 Paroxysmal atrial fibrillation: Secondary | ICD-10-CM

## 2020-05-05 DIAGNOSIS — Z66 Do not resuscitate: Secondary | ICD-10-CM

## 2020-05-05 DIAGNOSIS — I272 Pulmonary hypertension, unspecified: Secondary | ICD-10-CM

## 2020-05-05 DIAGNOSIS — I10 Essential (primary) hypertension: Secondary | ICD-10-CM

## 2020-05-05 DIAGNOSIS — I714 Abdominal aortic aneurysm, without rupture: Secondary | ICD-10-CM

## 2020-05-05 DIAGNOSIS — A419 Sepsis, unspecified organism: Principal | ICD-10-CM

## 2020-05-05 DIAGNOSIS — Z7189 Other specified counseling: Secondary | ICD-10-CM

## 2020-05-05 DIAGNOSIS — Z515 Encounter for palliative care: Secondary | ICD-10-CM

## 2020-05-05 LAB — COMPREHENSIVE METABOLIC PANEL
ALT: 20 U/L (ref 0–44)
AST: 33 U/L (ref 15–41)
Albumin: 2.5 g/dL — ABNORMAL LOW (ref 3.5–5.0)
Alkaline Phosphatase: 141 U/L — ABNORMAL HIGH (ref 38–126)
Anion gap: 17 — ABNORMAL HIGH (ref 5–15)
BUN: 86 mg/dL — ABNORMAL HIGH (ref 8–23)
CO2: 19 mmol/L — ABNORMAL LOW (ref 22–32)
Calcium: 9.1 mg/dL (ref 8.9–10.3)
Chloride: 104 mmol/L (ref 98–111)
Creatinine, Ser: 2.97 mg/dL — ABNORMAL HIGH (ref 0.44–1.00)
GFR calc Af Amer: 15 mL/min — ABNORMAL LOW (ref >60)
GFR calc non Af Amer: 13 mL/min — ABNORMAL LOW (ref >60)
Glucose, Bld: 120 mg/dL — ABNORMAL HIGH (ref 70–99)
Potassium: 3.1 mmol/L — ABNORMAL LOW (ref 3.5–5.1)
Sodium: 140 mmol/L (ref 135–145)
Total Bilirubin: 1 mg/dL (ref 0.3–1.2)
Total Protein: 6 g/dL — ABNORMAL LOW (ref 6.5–8.1)

## 2020-05-05 LAB — IRON AND TIBC
Iron: 10 ug/dL — ABNORMAL LOW (ref 28–170)
Saturation Ratios: 3 % — ABNORMAL LOW (ref 10.4–31.8)
TIBC: 393 ug/dL (ref 250–450)
UIBC: 383 ug/dL

## 2020-05-05 LAB — URINE CULTURE: Culture: >=100000 — AB

## 2020-05-05 LAB — GLUCOSE, CAPILLARY
Glucose-Capillary: 120 mg/dL — ABNORMAL HIGH (ref 70–99)
Glucose-Capillary: 123 mg/dL — ABNORMAL HIGH (ref 70–99)
Glucose-Capillary: 124 mg/dL — ABNORMAL HIGH (ref 70–99)
Glucose-Capillary: 186 mg/dL — ABNORMAL HIGH (ref 70–99)

## 2020-05-05 LAB — CBC WITH DIFFERENTIAL/PLATELET
Abs Immature Granulocytes: 0.1 10*3/uL — ABNORMAL HIGH (ref 0.00–0.07)
Basophils Absolute: 0 10*3/uL (ref 0.0–0.1)
Basophils Relative: 0 %
Eosinophils Absolute: 0.2 10*3/uL (ref 0.0–0.5)
Eosinophils Relative: 1 %
HCT: 32 % — ABNORMAL LOW (ref 36.0–46.0)
Hemoglobin: 9 g/dL — ABNORMAL LOW (ref 12.0–15.0)
Immature Granulocytes: 1 %
Lymphocytes Relative: 6 %
Lymphs Abs: 0.9 10*3/uL (ref 0.7–4.0)
MCH: 20.1 pg — ABNORMAL LOW (ref 26.0–34.0)
MCHC: 28.1 g/dL — ABNORMAL LOW (ref 30.0–36.0)
MCV: 71.6 fL — ABNORMAL LOW (ref 80.0–100.0)
Monocytes Absolute: 0.9 10*3/uL (ref 0.1–1.0)
Monocytes Relative: 6 %
Neutro Abs: 12.3 10*3/uL — ABNORMAL HIGH (ref 1.7–7.7)
Neutrophils Relative %: 86 %
Platelets: 483 10*3/uL — ABNORMAL HIGH (ref 150–400)
RBC: 4.47 MIL/uL (ref 3.87–5.11)
RDW: 18.9 % — ABNORMAL HIGH (ref 11.5–15.5)
WBC: 14.5 10*3/uL — ABNORMAL HIGH (ref 4.0–10.5)
nRBC: 0 % (ref 0.0–0.2)

## 2020-05-05 LAB — FERRITIN: Ferritin: 32 ng/mL (ref 11–307)

## 2020-05-05 LAB — FOLATE: Folate: 36.8 ng/mL (ref >5.9)

## 2020-05-05 LAB — VITAMIN B12: Vitamin B-12: 1142 pg/mL — ABNORMAL HIGH (ref 180–914)

## 2020-05-05 MED ORDER — SODIUM CHLORIDE 0.9% FLUSH
10.0000 mL | Freq: Two times a day (BID) | INTRAVENOUS | Status: DC
  Administered 2020-05-05 – 2020-05-10 (×5): 10 mL

## 2020-05-05 MED ORDER — SODIUM CHLORIDE 0.9 % IV SOLN
510.0000 mg | Freq: Once | INTRAVENOUS | Status: AC
  Administered 2020-05-05: 510 mg via INTRAVENOUS
  Filled 2020-05-05: qty 17

## 2020-05-05 MED ORDER — CHLORHEXIDINE GLUCONATE CLOTH 2 % EX PADS
6.0000 | MEDICATED_PAD | Freq: Every day | CUTANEOUS | Status: DC
  Administered 2020-05-05 – 2020-05-10 (×6): 6 via TOPICAL

## 2020-05-05 MED ORDER — POTASSIUM CHLORIDE 10 MEQ/100ML IV SOLN
10.0000 meq | INTRAVENOUS | Status: AC
  Administered 2020-05-05 (×4): 10 meq via INTRAVENOUS
  Filled 2020-05-05 (×4): qty 100

## 2020-05-05 MED ORDER — SODIUM CHLORIDE 0.9% FLUSH: 10.0000 mL | INTRAVENOUS | Status: DC | PRN

## 2020-05-05 MED ORDER — HALOPERIDOL LACTATE 5 MG/ML IJ SOLN
1.0000 mg | Freq: Four times a day (QID) | INTRAMUSCULAR | Status: DC | PRN
  Administered 2020-05-05: 1 mg via INTRAVENOUS
  Filled 2020-05-05 (×2): qty 1

## 2020-05-05 NOTE — Evaluation (Signed)
Physical Therapy Evaluation Patient Details Name: Jacqueline Reilly MRN: [id number] DOB: [birth year] Today's Date: 05/05/2020   History of Present Illness  Pt is a 86 yo female presenting with nausea and vomiting and slight lethargy. Upon workup, pt  found to have acute renal failure, sepsis due to diverticulitis, and possible aspiration pneumonia. PMH includes: cardiomyopathy, afib on amiodarone apixaban, dementia, anemia, and AAA.  Clinical Impression  Pt in bed upon arrival of PT, agreeable to evaluation at this time. The pt is a poor historian this afternoon, but per the pt's chart she has 24/7 [place] aides to provide supervision and assistance at home. The pt presents with limitations in functional mobility, strength, stability, and activity tolerance due to above dx and chronically reduced activity, and will continue to benefit from skilled PT to address these deficits. The pt was able to complete bed mobility and a sit-stand transfer, but required mod-maxA to complete due to deficits in strength and stability. The pt was also noted to be soiled and required assist of 2 to safely clean up and reposition in the bed. The pt will likely do better in a familiar environment given her cognitive deficits, and therefore, returning home with continuation of her 24/7 [facility] aides as well as HHPT will likely be the best course of action to improve her safety and mobility.      Follow Up Recommendations Home health PT;Supervision/Assistance - 24 hour    Equipment Recommendations   (pt has all needed equipment)    Recommendations for Other Services       Precautions / Restrictions Precautions Precautions: Fall Precaution Comments: monitor BP, LSO brace when OOB, 4LO2, sig dementia at baseline Required Braces or Orthoses: Spinal Brace Spinal Brace: Lumbar corset;Applied in sitting position Restrictions Weight Bearing Restrictions: No Other Position/Activity Restrictions: L4 compression fx      Mobility   Bed Mobility Overal bed mobility: Needs Assistance Bed Mobility: Supine to Sit     Supine to sit: Mod assist     General bed mobility comments: modA to initiate mobility of BLE to EOB and modA to elevate trunk from bed. minA to stabilize in sitting, repeated VC to complete scooting to EOB  Transfers Overall transfer level: Needs assistance Equipment used: 1 person hand held assist Transfers: Sit to/from Stand Sit to Stand: Max assist         General transfer comment: pt assisted with stand but requires sig assist to power up and maintain standing, poor knee and hip extension, sig trunk flexion, pt with BLE braced against bed  Ambulation/Gait Ambulation/Gait assistance:  (pt unable)              Stairs            Wheelchair Mobility    Modified Rankin (Stroke Patients Only)       Balance Overall balance assessment: Needs assistance Sitting-balance support: Feet supported;Bilateral upper extremity supported Sitting balance-Leahy Scale: Poor Sitting balance - Comments: pt unable to maintain BLE on floor, constantly drifting backwards without minA Postural control: Posterior lean Standing balance support: Bilateral upper extremity supported Standing balance-Leahy Scale: Poor Standing balance comment: BUE support to maintain with sig posterior lean and bracing of BLE on bed                             Pertinent Vitals/Pain Pain Assessment: Faces Faces Pain Scale: Hurts a little bit Pain Location: stomach Pain Descriptors / Indicators: Discomfort Pain  Intervention(s): Limited activity within patient's tolerance;Monitored during session    [place] expects to be discharged to:: Private residence Living Arrangements: Alone Available Help at Discharge: Personal care attendant;Available 24 hours/day (2 caregivers, 12 hr shifts) Type of Home: House Home Access: Stairs to enter   [employer] of Steps: 1 Home Layout:  One level Home Equipment: Clinical cytogeneticist - 4 wheels Additional Comments: information above taken from chart, pt unable to answer history questions today    Prior Function Level of Independence: Independent with assistive device(s);Needs assistance   Gait / Transfers Assistance Needed: Pt able to ambulate without AD in the home. Caregivers/family encouraged use of Rollator to maximize safety with pt using this too  ADL's / Homemaking Assistance Needed: Pt required assistance with tub transfer, showering tasks, UB dressing due to picc line, med mgmt and meal prep. Pt able to self feed, toilet self, and complete LB dressing  Comments: information above taken from chart, pt unable to answer history questions today     Hand Dominance   Dominant Hand: Right    Extremity/Trunk Assessment   Upper Extremity Assessment Upper Extremity Assessment: Generalized weakness    Lower Extremity Assessment Lower Extremity Assessment: Generalized weakness    Cervical / Trunk Assessment Cervical / Trunk Assessment: Kyphotic  Communication   Communication: HOH  Cognition Arousal/Alertness: Awake/alert Behavior During Therapy: Restless;Anxious Overall Cognitive Status: History of cognitive impairments - at baseline                                 General Comments: hx of dementia, A&Ox1, pt benefits from cues for sequencing, states she lives alone with her mother. pt able to follow simple, one-step cues      General Comments General comments (skin integrity, edema, etc.): upon sitting EOB, pt found to be soiled, pt unaware.    Exercises     Assessment/Plan    PT Assessment Patient needs continued PT services  PT Problem List Decreased strength;Decreased mobility;Decreased safety awareness;Decreased coordination;Decreased activity tolerance;Decreased balance;Decreased knowledge of use of DME       PT Treatment Interventions DME instruction;Therapeutic exercise;Gait  training;Balance training;Stair training;Functional mobility training;Therapeutic activities;Patient/family education    PT Goals (Current goals can be found in the Care Plan section)  Acute Rehab PT Goals Patient Stated Goal: to rest PT Goal Formulation: With patient Time For Goal Achievement: 05/19/20 Potential to Achieve Goals: Good    Frequency Min 3X/week   Barriers to discharge        Co-evaluation               AM-PAC PT "6 Clicks" Mobility  Outcome Measure Help needed turning from your back to your side while in a flat bed without using bedrails?: A Little Help needed moving from lying on your back to sitting on the side of a flat bed without using bedrails?: A Little Help needed moving to and from a bed to a chair (including a wheelchair)?: A Lot Help needed standing up from a chair using your arms (e.g., wheelchair or bedside chair)?: A Lot Help needed to walk in hospital room?: A Lot Help needed climbing 3-5 steps with a railing? : Total 6 Click Score: 13    End of Session Equipment Utilized During Treatment: Gait belt;Oxygen Activity Tolerance: Patient tolerated treatment well;Patient limited by fatigue Patient left: in bed;with call bell/phone within reach;with bed alarm set (pt with call bell but does not  understand how to use depsite repeated attempts to educate/explain) Nurse Communication: Mobility status (pt cog, need to check for BM due to pt inability to report) PT Visit Diagnosis: Other abnormalities of gait and mobility (R26.89);Difficulty in walking, not elsewhere classified (R26.2);Muscle weakness (generalized) (M62.81)    Time: 0600-4599 PT Time Calculation (min) (ACUTE ONLY): 28 min   Charges:   PT Evaluation $PT Eval Moderate Complexity: 1 Mod PT Treatments $Therapeutic Activity: 8-22 mins        Karma Ganja, PT, DPT   Acute Rehabilitation Department Pager #: [phone]  Otho Bellows 05/05/2020, 3:31 PM

## 2020-05-05 NOTE — Progress Notes (Signed)
PROGRESS NOTE  Jacqueline Reilly KZS:[id number] DOB: 22-Jul-1924 DOA: 05/03/2020 PCP: [name], MD  HPI/Recap of past 24 hours: HPI from Dr [name] is a 86 y.o. female with history of chronic mixed ischemic/nonischemic cardiomyopathy last EF was 25% with moderate right ventricular systolic dysfunction on milrinone, atrial fibrillation on amiodarone apixaban with dementia, anemia, abdominal aortic aneurysm was brought to the ER the patient had been having nausea vomiting poor oral intake for the last few days.  Patient also mildly lethargic.  Patient unable to provide any history due to dementia.  Most of the history was obtained from the ER physician who discussed with patient's healthcare power of attorney and also patient's son. In the ER patient had low normal blood pressure in the 73U systolic.  Lab work is significant for acute renal failure and leukocytosis.  Creatinine has worsened from 1.2 in December 2020 and is around 3 and WBC count is 24.7 with a hemoglobin of 8.5.  Chest x-ray shows possible infiltrates.  CT abdomen pelvis is showing possible acute diverticulitis involving the proximal transverse colon.  In addition there is concern for possible metastatic lesions in the liver.  L4 compression fracture also.  Patient was started on empiric antibiotics.  Initially patient was given fluid for 1L for the acute renal failure.  Patient admitted for further management.     Patient seen and examined at bedside, noted to be more awake, able to have simple conversation, reports poor appetite, denies any worsening abdominal pain, nausea/vomiting, fever/chills.   Assessment/Plan: Active Problems:   Hypertension   AAA (abdominal aortic aneurysm) (HCC)   Atrial fibrillation (HCC)   Chronic anemia   Pulmonary hypertension (HCC)   Acute diverticulitis   AKI (acute kidney injury) ([place])   Sepsis likely 2/2 acute diverticulitis Noted leukocytosis, tachypnea on  admission Currently afebrile, with resolving leukocytosis CT abdomen/pelvis showed acute diverticulitis of the proximal transverse colon, no abscess or perforation noted Status post IV fluids, will hold off due to history of cardiomyopathy, encouraged to drink orally, full liquid diet Continue Zosyn Monitor closely  Possible aspiration pneumonia History of coughing and vomiting recently Chest x-ray with possible infiltrate within the right upper lobe and left lung base SLP on board Full liquid diet Continue IV Zosyn  AKI on CKD stage IIIb/metabolic acidosis/anion gap Baseline creatinine 1.2-1.4, on admission 3.09 Status post gentle hydration Hold home diuretics Daily BMP  Anemia of chronic kidney disease/iron deficiency anemia Baseline hemoglobin around 10, on admission 8.5 Anemia panel showed iron 10, sats 3 Give 1 dose of Feraheme IV on 05/05/2020 P.o. iron supplementation No obvious signs of bleeding Daily CBC   Hypokalemia Replace as needed  History of chronic cardiomyopathy/chronic systolic HF Chronic hypoxic respiratory failure Appears dry Last echo with EF of 25% Discussed with Dr. [name] HF, recommend continuing milrinone for now and holding off all diuretics Continue milrinone  History of paroxysmal A. Fib Heart rate controlled Hold home amiodarone for now Continue home Eliquis  Possible metastatic lesions in liver CT abdomen showed innumerable hepatic hypodense masses consistent with metastatic disease Discussed with family, no further aggressive work-up  L4 compression fracture CT abdomen/pelvis showed L4 compression fracture possibly acute or subacute Notified neurosurgery, nonoperative management, back brace ordered for patient to use upon standing, sitting up and ambulation Pain management  History of AAA 4.2 cm infrarenal abdominal aortic aneurysm increased in size To further evaluate CT with IV contrast recommended, patient with AKI, can be pursued  as  an outpatient  Dementia Delirium precautions  Goals of care discussion Patient with very poor prognosis, advanced age, multiple comorbidities Palliative care consulted        Malnutrition Type:      Malnutrition Characteristics:      Nutrition Interventions:       Estimated body mass index is 20.94 kg/m as calculated from the following:   Height as of this encounter: 4\' 9"  (1.448 m).   Weight as of this encounter: 43.9 kg.     Code Status: DNR   Family Communication: Discussed with nephew Ihor Austin and niece Aura Camps on 05/04/2020  Disposition Plan: Status is: Inpatient  Remains inpatient appropriate because:Inpatient level of care appropriate due to severity of illness   Dispo: The patient is from: Home (has 24-hour care)              Anticipated d/c is to: Likely home              Anticipated d/c date is: 3 days              Patient currently is not medically stable to d/c.    Consultants:  None  Procedures:  None  Antimicrobials:  Zosyn  DVT prophylaxis: Eliquis   Objective: Vitals:   05/05/20 0400 05/05/20 0402 05/05/20 0734 05/05/20 1137  BP: 102/60  101/64 109/71  Pulse: 74  83 85  Resp: (!) 23  19 16   Temp: 97.9 F (36.6 C)  (!) 97.4 F (36.3 C) 97.6 F (36.4 C)  TempSrc: Oral  Axillary Oral  SpO2: 94%  97% 97%  Weight:  43.9 kg    Height:        Intake/Output Summary (Last 24 hours) at 05/05/2020 1632 Last data filed at 05/05/2020 1500 Gross per 24 hour  Intake 288.66 ml  Output --  Net 288.66 ml   Filed Weights   05/04/20 0000 05/05/20 0402  Weight: 45.9 kg 43.9 kg    Exam:  General: NAD, pleasantly confused  Cardiovascular: S1, S2 present  Respiratory: CTAB  Abdomen: Soft, nontender, nondistended, bowel sounds present  Musculoskeletal: No bilateral pedal edema noted  Skin: Normal  Psychiatry:  Unable to assess    Data Reviewed: CBC: Recent Labs  Lab 05/03/20 1718 05/04/20 0415  05/05/20 0701  WBC 24.7* 19.8* 14.5*  NEUTROABS 22.7*  --  12.3*  HGB 8.5* 8.3* 9.0*  HCT 29.2* 28.5* 32.0*  MCV 70.7* 71.8* 71.6*  PLT 478* 386 270*   Basic Metabolic Panel: Recent Labs  Lab 05/03/20 1718 05/04/20 0415 05/05/20 0701  NA 135 139 140  K 3.5 3.6 3.1*  CL 100 105 104  CO2 21* 18* 19*  GLUCOSE 129* 118* 120*  BUN 99* 93* 86*  CREATININE 3.09* 2.93* 2.97*  CALCIUM 9.0 8.7* 9.1   GFR: Estimated Creatinine Clearance: 7.1 mL/min (A) (by C-G formula based on SCr of 2.97 mg/dL (H)). Liver Function Tests: Recent Labs  Lab 05/03/20 1718 05/05/20 0701  AST 40 33  ALT 22 20  ALKPHOS 155* 141*  BILITOT 0.7 1.0  PROT 6.0* 6.0*  ALBUMIN 2.7* 2.5*   No results for input(s): LIPASE, AMYLASE in the last 168 hours. No results for input(s): AMMONIA in the last 168 hours. Coagulation Profile: No results for input(s): INR, PROTIME in the last 168 hours. Cardiac Enzymes: No results for input(s): CKTOTAL, CKMB, CKMBINDEX, TROPONINI in the last 168 hours. BNP (last 3 results) No results for input(s): PROBNP in the last 8760 hours.  HbA1C: No results for input(s): HGBA1C in the last 72 hours. CBG: Recent Labs  Lab 05/04/20 0040 05/04/20 0806 05/05/20 0032 05/05/20 0731  GLUCAP 143* 119* 186* 123*   Lipid Profile: No results for input(s): CHOL, HDL, LDLCALC, TRIG, CHOLHDL, LDLDIRECT in the last 72 hours. Thyroid Function Tests: No results for input(s): TSH, T4TOTAL, FREET4, T3FREE, THYROIDAB in the last 72 hours. Anemia Panel: Recent Labs    05/05/20 0701  VITAMINB12 1,142*  FOLATE 36.8  FERRITIN 32  TIBC 393  IRON 10*   Urine analysis:    Component Value Date/Time   COLORURINE YELLOW 05/03/2020 2139   APPEARANCEUR TURBID (A) 05/03/2020 2139   LABSPEC 1.010 05/03/2020 2139   PHURINE 6.0 05/03/2020 2139   GLUCOSEU NEGATIVE 05/03/2020 2139   HGBUR MODERATE (A) 05/03/2020 2139   [place] NEGATIVE 05/03/2020 2139   [place] NEGATIVE 05/03/2020 2139    PROTEINUR 30 (A) 05/03/2020 2139   UROBILINOGEN 0.2 09/01/2015 1254   NITRITE NEGATIVE 05/03/2020 2139   LEUKOCYTESUR LARGE (A) 05/03/2020 2139   Sepsis Labs: @LABRCNTIP (procalcitonin:4,lacticidven:4)  ) Recent Results (from the past 240 hour(s))  Blood culture (routine x 2)     Status: None (Preliminary result)   Collection Time: 05/03/20  8:24 PM   Specimen: BLOOD  Result Value Ref Range Status   Specimen Description BLOOD RIGHT WRIST  Final   Special Requests   Final    BOTTLES DRAWN AEROBIC AND ANAEROBIC Blood Culture results may not be optimal due to an inadequate volume of blood received in culture bottles   Culture   Final    NO GROWTH 2 DAYS Performed at [facility], [place] [address] [place], [place] [address]    Report Status PENDING  Incomplete  Urine culture     Status: Abnormal   Collection Time: 05/03/20 10:11 PM   Specimen: Urine, Random  Result Value Ref Range Status   Specimen Description URINE, RANDOM  Final   Special Requests   Final    NONE Performed at [facility], [place] [address], [place], [place] [address]    Culture >=100,000 COLONIES/mL ESCHERICHIA COLI (A)  Final   Report Status 05/05/2020 FINAL  Final   Organism ID, Bacteria ESCHERICHIA COLI (A)  Final      Susceptibility   Escherichia coli - MIC*    AMPICILLIN <=2 SENSITIVE Sensitive     CEFAZOLIN <=4 SENSITIVE Sensitive     CEFTRIAXONE <=0.25 SENSITIVE Sensitive     CIPROFLOXACIN <=0.25 SENSITIVE Sensitive     GENTAMICIN <=1 SENSITIVE Sensitive     IMIPENEM <=0.25 SENSITIVE Sensitive     NITROFURANTOIN <=16 SENSITIVE Sensitive     TRIMETH/SULFA <=20 SENSITIVE Sensitive     AMPICILLIN/SULBACTAM <=2 SENSITIVE Sensitive     PIP/TAZO <=4 SENSITIVE Sensitive     * >=100,000 COLONIES/mL ESCHERICHIA COLI  SARS Coronavirus 2 by RT PCR (hospital order, performed in [facility] hospital lab) Nasopharyngeal Nasopharyngeal Swab     Status: None   Collection Time: 05/03/20 10:38 PM    Specimen: Nasopharyngeal Swab  Result Value Ref Range Status   SARS Coronavirus 2 NEGATIVE NEGATIVE Final    Comment: (NOTE) SARS-CoV-2 target nucleic acids are NOT DETECTED.  The SARS-CoV-2 RNA is generally detectable in upper and lower respiratory specimens during the acute phase of infection. The lowest concentration of SARS-CoV-2 viral copies this assay can detect is 250 copies / mL. A negative result does not preclude SARS-CoV-2 infection and should not be used as the  sole basis for treatment or other patient management decisions.  A negative result may occur with improper specimen collection / handling, submission of specimen other than nasopharyngeal swab, presence of viral mutation(s) within the areas targeted by this assay, and inadequate number of viral copies (<250 copies / mL). A negative result must be combined with clinical observations, patient history, and epidemiological information.  Fact Sheet for Patients:   StrictlyIdeas.no  Fact Sheet for Healthcare Providers: BankingDealers.co.za  This test is not yet approved or  cleared by the [place] FDA and has been authorized for detection and/or diagnosis of SARS-CoV-2 by FDA under an Emergency Use Authorization (EUA).  This EUA will remain in effect (meaning this test can be used) for the duration of the COVID-19 declaration under Section 564(b)(1) of the Act, 21 U.S.C. section 360bbb-3(b)(1), unless the authorization is terminated or revoked sooner.  Performed at [facility], [place] [address] [place], [place] [address]       Studies: No results found.  Scheduled Meds: . apixaban  2.5 mg Oral BID  . busPIRone  5 mg Oral BID  . Chlorhexidine Gluconate Cloth  6 each Topical Daily  . OLANZapine  2.5 mg Oral Q supper  . sodium chloride flush  10-40 mL Intracatheter Q12H  . vancomycin variable dose per unstable renal function (pharmacist dosing)   Does  not apply See admin instructions    Continuous Infusions: . milrinone 0.125 mcg/kg/min (05/05/20 1500)  . piperacillin-tazobactam (ZOSYN)  IV Stopped (05/05/20 1417)     LOS: 2 days     Alma Friendly, MD [facility]  If 7PM-7AM, please contact night-coverage [email or web address] 05/05/2020, 4:32 PM

## 2020-05-05 NOTE — Consult Note (Signed)
Consultation Note Date: 05/05/2020   Patient Name: Jacqueline Reilly  DOB: 1924/07/27  MRN: [id number]  Age / Sex: 86 y.o., female   PCP: [name], MD Referring Physician: Alma Friendly, MD   REASON FOR CONSULTATION:Establishing goals of care  Palliative Care consult requested for goals of care discussion in this 86 y.o. 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  Patient presented to the ED from home with complaint of nausea, vomiting, poor oral intake, and lethargy.  Chest x-ray showed possible infiltrates.  CT abdomen pelvis showed possible acute diverticulitis involving transverse colon.  There is also concern for possible metastatic lesions within the liver.  L4 compression fracture.  Patient receiving IV antibiotics for sepsis and possible aspiration pneumonia.  Clinical Assessment and Goals of Care: I have reviewed medical records including lab results, imaging, Epic notes, and MAR, received report from the bedside RN, and assessed the patient. I spoke with patient's nephew, Ihor Austin [facility]) via phone to discuss diagnosis prognosis, [place], EOL wishes, disposition and options.  [name] suffers from dementia.  She is alert to self only.  Denies pain or shortness of breath.  Will follow simple commands.  I introduced Palliative Medicine as specialized medical care for people living with serious illness. It focuses on providing relief from the symptoms and stress of a serious illness. The goal is to improve quality of life for both the patient and the family.  Nephew verbalizes understanding and appreciation.  Patient has 1 son who resides in [place].  Her nephew/HCPOA  Resides in [place].  Patient lives in the home with hired 24/7  caregivers.  Prior to admission patient required assistance with ADLs.  We discussed Her current illness and what it means in the larger context of Her on-going co-morbidities. With specific discussions regarding advanced dementia, dehydration, possible aspiration pneumonia, new findings of liver metastasis, and her overall functional and nutritional decline. Natural disease trajectory and expectations at EOL were discussed.  Freddie verbalized understanding and appreciation of discussion regarding patient's current illness and comorbidities.  He shares understanding of limitations in care given patient's advanced age in addition to her comorbidities.  Support given.  Annalee Genta shares he is a direct person and would like to know honestly how patient is doing with direct guidance to recommendations.  I attempted to elicit values and goals of care important to the patient.    Freddie reports family has been in discussion.  Patient son is coming into town to visit.  He reports family would not like to seek any further work-up in regards to possible cancer and metastasis.  They are not interested in aggressive medical interventions such as surgery, artificial feedings, or long-term hydration.  Family remains hopeful patient will have some improvement while hospitalized allowing her an opportunity to return home with caregivers and family and enjoy what time she has left.   Education regarding continued aggressive medical interventions versus comfort care.  Annalee Genta is requesting to continue with current medical treatment while hospitalized allow patient again every opportunity to show some improvement/stability.  He verbalizes understanding of her poor prognosis and disease trajectory.  He would like to continue to treat the treatable with a focus of getting her back home.  Freddie confirms patient's wishes for DNR/DNI.   Hospice and Palliative Care services outpatient were explained and offered.  Nephew verbalized his understanding and awareness of both palliative and hospice's goals and philosophy of care.  He reports family has already been in discussion knowing the goal for patient is comfort but allowing her every opportunity to thrive as long as she can in a natural state.  Given family's clearly stated goals of no aggressive interventions or further work-up for possible metastatic liver lesions recommendations provided for outpatient hospice support at discharge.  Freddie would like to continue with discussions amongst family in deciding on hospice care outpatient. Education also provided on residential hospice however, he declines expressing patient would want to be in her own home and a facility is not an option to allow this for her.   Questions and concerns were addressed. The family was encouraged to call with questions or of.  PMT will continue to support holistically.   SOCIAL HISTORY:     reports that she has never smoked. She has never used smokeless tobacco. She reports current alcohol use. She reports that she does not use drugs.  CODE STATUS: DNR  ADVANCE DIRECTIVES:  Ihor Austin (nephew/HCPOA)    SYMPTOM MANAGEMENT:per attending   Palliative Prophylaxis:   Aspiration, Bowel Regimen, Delirium Protocol, Eye Care, Frequent Pain Assessment, Oral Care and Turn Reposition  PSYCHO-SOCIAL/SPIRITUAL:  Support System: Family  Desire for further Chaplaincy support: NO  Additional Recommendations (Limitations, Scope, Preferences):  Avoid Hospitalization, No Artificial Feeding, No Chemotherapy, No Diagnostics, No Surgical Procedures and treat the treatable, no escalation in care, with a goal of returning home for comfort.    PAST MEDICAL HISTORY: Past Medical History:  Diagnosis Date  . AAA (abdominal aortic aneurysm) ([place])   . Atrial fibrillation ([place])   . Chronic systolic heart failure ([place])    a. ECHO (04/2014) EF 20-25%, diff HK, mild MR  . Coronary artery  disease 2007   moderate ASCAD of the left system s/p PCI of the D2 and PCI of the left circ 03/2009  . Hyperlipidemia   . Hypertension   . PVD (peripheral vascular disease) ([place])   . Renal artery stenosis ([place])   . Ventricular dysfunction    left; ischemic    PAST SURGICAL HISTORY:  Past Surgical History:  Procedure Laterality Date  . ANGIOPLASTY    . ANTERIOR APPROACH HEMI HIP ARTHROPLASTY Left 03/05/2017   Procedure: ANTERIOR APPROACH LEFT HIP HEMI ARTHROPLASTY;  Surgeon: Leandrew Koyanagi, MD;  Location: [facility];  Service: Orthopedics;  Laterality: Left;  . CORONARY STENT PLACEMENT    . IR CV LINE INJECTION  03/06/2020  . IR FLUORO GUIDE CV LINE LEFT  03/06/2020  . IR GENERIC HISTORICAL  09/25/2016   IR FLUORO GUIDE CV LINE RIGHT 09/25/2016 Ardis Rowan, PA-C [facility]INTER[facility] RAD  . IR US GUIDE VASC ACCESS LEFT  03/06/2020  . IR US GUIDE VASC ACCESS RIGHT  03/06/2020  . retinal cryopexy     right eye (for retinal detachment)    ALLERGIES:  is allergic to tape, codeine, colchicine, and garlic.   MEDICATIONS:  Current Facility-Administered Medications  Medication Dose Route Frequency Provider Last Rate Last Admin  . acetaminophen (TYLENOL) tablet 650 mg  650 mg Oral Q6H PRN Alma Friendly, MD      . apixaban Arne [place]) tablet 2.5 mg  2.5 mg Oral BID Rise Patience, MD   2.5 mg at 05/05/20 6063  . busPIRone (BUSPAR) tablet 5 mg  5 mg Oral BID Rise Patience, MD   5 mg at 05/05/20 0160  .  Chlorhexidine Gluconate Cloth 2 % PADS 6 each  6 each Topical Daily Alma Friendly, MD   6 each at 05/05/20 1218  . milrinone (PRIMACOR) 20 MG/100 ML (0.2 mg/mL) infusion  0.125 mcg/kg/min (Order-Specific) Intravenous Continuous Rise Patience, MD 1.61 mL/hr at 05/04/20 0942 0.125 mcg/kg/min at 05/04/20 0942  . OLANZapine (ZYPREXA) tablet 2.5 mg  2.5 mg Oral Q supper Rise Patience, MD   2.5 mg at 05/04/20 1904  . ondansetron (ZOFRAN) tablet 4 mg  4 mg Oral Q6H PRN  Rise Patience, MD       Or  . ondansetron [facility]) injection 4 mg  4 mg Intravenous Q6H PRN Rise Patience, MD   4 mg at 05/04/20 1521  . piperacillin-tazobactam (ZOSYN) IVPB 2.25 g  2.25 g Intravenous Q8H Laren Everts, RPH 100 mL/hr at 05/05/20 0625 2.25 g at 05/05/20 0625  . sodium chloride flush (NS) 0.9 % injection 10-40 mL  10-40 mL Intracatheter Q12H Alma Friendly, MD   10 mL at 05/05/20 1218  . sodium chloride flush (NS) 0.9 % injection 10-40 mL  10-40 mL Intracatheter PRN Alma Friendly, MD      . vancomycin variable dose per unstable renal function (pharmacist dosing)   Does not apply See admin instructions Rumbarger, Valeda Malm, RPH        VITAL SIGNS: BP 109/71 (BP Location: Right Arm)   Pulse 85   Temp 97.6 F (36.4 C) (Oral)   Resp 16   Ht 4\' 9"  (1.448 m)   Wt 43.9 kg   SpO2 97%   BMI 20.94 kg/m  Filed Weights   05/04/20 0000 05/05/20 0402  Weight: 45.9 kg 43.9 kg    Estimated body mass index is 20.94 kg/m as calculated from the following:   Height as of this encounter: 4\' 9"  (1.448 m).   Weight as of this encounter: 43.9 kg.  LABS: CBC:    Component Value Date/Time   WBC 14.5 (H) 05/05/2020 0701   HGB 9.0 (L) 05/05/2020 0701   HGB 10.2 (L) 10/28/2019 0000   HCT 32.0 (L) 05/05/2020 0701   HCT 33.7 (L) 10/28/2019 0000   PLT 483 (H) 05/05/2020 0701   PLT 445 10/28/2019 0000   Comprehensive Metabolic Panel:    Component Value Date/Time   NA 140 05/05/2020 0701   NA 140 10/28/2019 0000   K 3.1 (L) 05/05/2020 0701   CO2 19 (L) 05/05/2020 0701   BUN 86 (H) 05/05/2020 0701   BUN 23 10/28/2019 0000   CREATININE 2.97 (H) 05/05/2020 0701   ALBUMIN 2.5 (L) 05/05/2020 0701     Review of Systems  Unable to perform ROS: Dementia   Physical Exam General: NAD, confused Cardiovascular: regular rate and rhythm Pulmonary: diminished bilaterally Abdomen: soft, nontender, + bowel sounds Extremities: no edema, no joint  deformities Skin: no rashes, warm and dry Neurological: alert to self only (able to express name and dob appropriately), will follow simple commands.   Prognosis: Poor in the setting of end-stage, hypertension, AAA, atrial fibrillation, anemia, pulmonary hypertension, acute diverticulitis, sepsis, possible aspiration pneumonia, acute on chronic kidney disease, CHF, possible metastatic lesions in liver, (family request no further work-up), advanced dementia, L4 compression fracture, and deconditioned.  Discharge Planning:  Her nephew home with hospice versus palliative.  Family will continue discussion.  Recommendations:  DNR/DNI-as confirmed by nephew/POA  Continue with current plan of care per medical team.  No aggressive interventions/no escalation of care.  Per  Freddie [facility]) will like to continue with treatment with hopes of patient stabilizing and able to return home with caregivers/family.  Family would not like to proceed with any further work-up in regards to possible metastatic disease.  Goal is for comfort and to allow patient every opportunity to continue to thrive naturally with what time she has left.  Patient son coming in to visit from [place], [place] verbalized consent to release any healthcare information as requested.  Family remains in discussion regarding outpatient hospice/palliative resources.  Family plans to notify myself/medical team with final decisions again with clear understanding of no further aggressive interventions/work-up.  PMT will continue to support and follow   Palliative Performance Scale: PPS 20%              Freddie expressed understanding and was in agreement with this plan.   Thank you for allowing the Palliative Medicine Team to assist in the care of this patient.  Time In: 1115 Time Out: 1210 Time Total: 55 min.   Visit consisted of counseling and education dealing with the complex and emotionally intense issues of symptom  management and palliative care in the setting of serious and potentially life-threatening illness.Greater than 50%  of this time was spent counseling and coordinating care related to the above assessment and plan.  Signed by:  Alda Lea, AGPCNP-BC Palliative Medicine Team  Phone: [phone] Fax: [phone] Pager: [phone] Amion: Bjorn Pippin

## 2020-05-05 NOTE — TOC Progression Note (Signed)
Transition of Care [facility]) - Progression Note    Patient Details  Name: Jacqueline Reilly MRN: [id number] Date of Birth: 1923-11-27  Transition of Care [facility]) CM/SW [place], RN Phone Number: 05/05/2020, 12:39 PM  Clinical Narrative:    [facility] notified this RNCM that they will be admitting her to home hospice upon discharge. She is also followed by [facility] for Home health RN.    Expected Discharge Plan: [place] Barriers to Discharge: Continued Medical Work up  Expected Discharge Plan and Services Expected Discharge Plan: [place]   Discharge Planning Services: CM Consult   Living arrangements for the past 2 months: Single Family Home                                       Social Determinants of Health (SDOH) Interventions    Readmission Risk Interventions No flowsheet data found.

## 2020-05-05 NOTE — Progress Notes (Signed)
Occupational Therapy Evaluation Patient Details Name: Jacqueline Reilly MRN: [id number] DOB: 02-22-1924 Today's Date: 05/05/2020    History of Present Illness Jacqueline Reilly is a 86 y.o. female with history of chronic mixed ischemic/nonischemic cardiomyopathy last EF was 25% with moderate right ventricular systolic dysfunction on milrinone, atrial fibrillation on amiodarone apixaban with dementia, anemia abdominal aortic aneurysm was brought to the ER the patient had been having nausea vomiting poor oral intake for the last few days.  Patient also mildly lethargic.  Patient unable to provide any history due to dementia.   Clinical Impression   PTA, pt lives at home alone but has 24/7 caregivers that provide light assist with ADLs, IADLs and mobility using Rollator. PLOF obtained from caregiver as pt poor historian. Pt demonstrates ability to perform simple grooming tasks at bed level with supervision, suspect grossly Mod A for LB ADLs due to decreased strength, low endurance, and low BP reading lying in bed. Despite encouragement and initiation of tasks, pt refused to progress further than bed mobility at this time. Will continue to follow acutely and attempt in further progression with ADLs, mobility. Pt may perform better with familiar caregivers/family during session.     Follow Up Recommendations  Home health OT;Supervision/Assistance - 24 hour    Equipment Recommendations  None recommended by OT (appears to have all needed DME)    Recommendations for Other Services       Precautions / Restrictions Precautions Precautions: Fall Precaution Comments: monitor BP, LSO brace when OOB, 4LO2 Required Braces or Orthoses: Spinal Brace Spinal Brace: Lumbar corset;Applied in sitting position Restrictions Weight Bearing Restrictions: No      Mobility Bed Mobility Overal bed mobility: Needs Assistance Bed Mobility: Supine to Sit     Supine to sit: Mod assist     General bed mobility  comments: Mod A for initiation of bed mobility to sit EOB with pt demo use of HHA to advance trunk, but ultimately declined to fully sit EOB and refused further OOB activities  Transfers                 General transfer comment: pt refused    Balance Overall balance assessment: Needs assistance Sitting-balance support: Feet supported;Bilateral upper extremity supported Sitting balance-Leahy Scale: Fair                                     ADL either performed or assessed with clinical judgement   ADL Overall ADL's : Needs assistance/impaired Eating/Feeding: Sitting;Supervision/ safety   Grooming: Supervision/safety;Wash/dry face;Brushing hair;Bed level Grooming Details (indicate cue type and reason): Supervision for cueing and encouragement to complete task Upper Body Bathing: Minimal assistance;Sitting   Lower Body Bathing: Moderate assistance;Bed level   Upper Body Dressing : Minimal assistance;Sitting   Lower Body Dressing: Moderate assistance;Bed level       Toileting- Clothing Manipulation and Hygiene: Maximal assistance;Bed level         General ADL Comments: Pt resistant to complete OOB activities, perseverating on stomach discomfort and nausea. Pt with generalized weakness, decreased endurance (4 L O2 at baseline), and low BP that impact safety with ADLs and mobility.      Vision Baseline Vision/History: No visual deficits Vision Assessment?: No apparent visual deficits     Perception     Praxis      Pertinent Vitals/Pain Pain Assessment: Faces Faces Pain Scale: Hurts a little bit Pain Location: stomach  Pain Descriptors / Indicators: Discomfort Pain Intervention(s): Limited activity within patient's tolerance;Monitored during session     Hand Dominance Right   Extremity/Trunk Assessment Upper Extremity Assessment Upper Extremity Assessment: Generalized weakness   Lower Extremity Assessment Lower Extremity Assessment: Defer to  PT evaluation       Communication Communication Communication: HOH   Cognition Arousal/Alertness: Awake/alert Behavior During Therapy: Restless;Anxious Overall Cognitive Status: History of cognitive impairments - at baseline                                 General Comments: hx of dementia, A&Ox1   General Comments  HR up to 105bpm. Pt BP at rest 94/68. Pt resistant to OOB activities despite multimodal cues and efforts for encouragement.     Exercises     Shoulder Instructions      Home Living Family/patient expects to be discharged to:: Private residence Living Arrangements: Alone Available Help at Discharge: Personal care attendant;Available 24 hours/day (2 caregivers daily 12 hour shifts ) Type of Home: House Home Access: Stairs to enter [employer] of Steps: 1   Home Layout: One level     Bathroom Shower/Tub: Tub/shower unit         Home Equipment: Clinical cytogeneticist - 4 wheels          Prior Functioning/Environment Level of Independence: Independent with assistive device(s);Needs assistance  Gait / Transfers Assistance Needed: Pt able to ambulate without AD in the home. Caregivers/family encouraged use of Rollator to maximize safety with pt using this too ADL's / Homemaking Assistance Needed: Pt required assistance with tub transfer, showering tasks, UB dressing due to picc line, med mgmt and meal prep. Pt able to self feed, toilet self, and complete LB dressing            OT Problem List: Decreased strength;Decreased activity tolerance;Impaired balance (sitting and/or standing);Decreased safety awareness;Decreased knowledge of precautions      OT Treatment/Interventions: Self-care/ADL training;Therapeutic exercise;Energy conservation;DME and/or AE instruction;Therapeutic activities;Patient/family education    OT Goals(Current goals can be found in the care plan section) Acute Rehab OT Goals Patient Stated Goal: to rest OT Goal  Formulation: With patient/family Time For Goal Achievement: 05/19/20 Potential to Achieve Goals: Good ADL Goals Pt Will Perform Lower Body Dressing: with min guard assist;sit to/from stand Pt Will Transfer to Toilet: with min guard assist;stand pivot transfer;bedside commode Pt Will Perform Toileting - Clothing Manipulation and hygiene: with min guard assist;sit to/from stand  OT Frequency: Min 2X/week   Barriers to D/C:            Co-evaluation              AM-PAC OT "6 Clicks" Daily Activity     Outcome Measure Help from another person eating meals?: A Little Help from another person taking care of personal grooming?: A Little Help from another person toileting, which includes using toliet, bedpan, or urinal?: A Lot Help from another person bathing (including washing, rinsing, drying)?: A Lot Help from another person to put on and taking off regular upper body clothing?: A Little Help from another person to put on and taking off regular lower body clothing?: A Lot 6 Click Score: 15   End of Session Equipment Utilized During Treatment: Oxygen Nurse Communication: Mobility status;Other (comment) (nausea)  Activity Tolerance: Patient limited by fatigue;Other (comment) (limited by cognition) Patient left: in bed;with bed alarm set  OT Visit Diagnosis: Unsteadiness on feet (  R26.81);Other abnormalities of gait and mobility (R26.89);Muscle weakness (generalized) (M62.81);Adult, failure to thrive (R62.7)                Time: 0823-0850 OT Time Calculation (min): 27 min Charges:  OT General Charges $OT Visit: 1 Visit OT Evaluation $OT Eval Moderate Complexity: 1 Mod OT Treatments $Therapeutic Activity: 8-22 mins  Layla Maw, OTR/L  Layla Maw 05/05/2020, 10:15 AM

## 2020-05-06 DIAGNOSIS — I130 Hypertensive heart and chronic kidney disease with heart failure and stage 1 through stage 4 chronic kidney disease, or unspecified chronic kidney disease: Secondary | ICD-10-CM | POA: Diagnosis not present

## 2020-05-06 LAB — COMPREHENSIVE METABOLIC PANEL
ALT: 17 U/L (ref 0–44)
AST: 30 U/L (ref 15–41)
Albumin: 2.4 g/dL — ABNORMAL LOW (ref 3.5–5.0)
Alkaline Phosphatase: 127 U/L — ABNORMAL HIGH (ref 38–126)
Anion gap: 15 (ref 5–15)
BUN: 81 mg/dL — ABNORMAL HIGH (ref 8–23)
CO2: 20 mmol/L — ABNORMAL LOW (ref 22–32)
Calcium: 9.1 mg/dL (ref 8.9–10.3)
Chloride: 105 mmol/L (ref 98–111)
Creatinine, Ser: 2.74 mg/dL — ABNORMAL HIGH (ref 0.44–1.00)
GFR calc Af Amer: 17 mL/min — ABNORMAL LOW (ref >60)
GFR calc non Af Amer: 14 mL/min — ABNORMAL LOW (ref >60)
Glucose, Bld: 118 mg/dL — ABNORMAL HIGH (ref 70–99)
Potassium: 4 mmol/L (ref 3.5–5.1)
Sodium: 140 mmol/L (ref 135–145)
Total Bilirubin: 1 mg/dL (ref 0.3–1.2)
Total Protein: 6.1 g/dL — ABNORMAL LOW (ref 6.5–8.1)

## 2020-05-06 LAB — CBC WITH DIFFERENTIAL/PLATELET
Abs Immature Granulocytes: 0.1 10*3/uL — ABNORMAL HIGH (ref 0.00–0.07)
Basophils Absolute: 0 10*3/uL (ref 0.0–0.1)
Basophils Relative: 0 %
Eosinophils Absolute: 0.3 10*3/uL (ref 0.0–0.5)
Eosinophils Relative: 2 %
HCT: 29.5 % — ABNORMAL LOW (ref 36.0–46.0)
Hemoglobin: 8.5 g/dL — ABNORMAL LOW (ref 12.0–15.0)
Immature Granulocytes: 1 %
Lymphocytes Relative: 6 %
Lymphs Abs: 0.8 10*3/uL (ref 0.7–4.0)
MCH: 20.5 pg — ABNORMAL LOW (ref 26.0–34.0)
MCHC: 28.8 g/dL — ABNORMAL LOW (ref 30.0–36.0)
MCV: 71.3 fL — ABNORMAL LOW (ref 80.0–100.0)
Monocytes Absolute: 0.8 10*3/uL (ref 0.1–1.0)
Monocytes Relative: 7 %
Neutro Abs: 10.9 10*3/uL — ABNORMAL HIGH (ref 1.7–7.7)
Neutrophils Relative %: 84 %
Platelets: 462 10*3/uL — ABNORMAL HIGH (ref 150–400)
RBC: 4.14 MIL/uL (ref 3.87–5.11)
RDW: 18.6 % — ABNORMAL HIGH (ref 11.5–15.5)
WBC: 12.9 10*3/uL — ABNORMAL HIGH (ref 4.0–10.5)
nRBC: 0 % (ref 0.0–0.2)

## 2020-05-06 LAB — GLUCOSE, CAPILLARY
Glucose-Capillary: 147 mg/dL — ABNORMAL HIGH (ref 70–99)
Glucose-Capillary: 127 mg/dL — ABNORMAL HIGH (ref 70–99)

## 2020-05-06 LAB — MRSA PCR SCREENING: MRSA by PCR: NEGATIVE

## 2020-05-06 MED ORDER — MELATONIN 3 MG PO TABS
3.0000 mg | ORAL_TABLET | Freq: Every evening | ORAL | Status: DC | PRN
  Administered 2020-05-08: 3 mg via ORAL
  Filled 2020-05-06 (×3): qty 1

## 2020-05-06 MED ORDER — LORAZEPAM 2 MG/ML IJ SOLN
0.5000 mg | Freq: Four times a day (QID) | INTRAMUSCULAR | Status: DC | PRN
  Administered 2020-05-07: 0.5 mg via INTRAVENOUS
  Filled 2020-05-06: qty 1

## 2020-05-06 NOTE — Progress Notes (Signed)
Daily Progress Note   Patient Name: Jacqueline Reilly       Date: 05/06/2020 DOB: Dec 15, 1923  Age: 86 y.o. MRN#: [id number] Attending Physician: Alma Friendly, MD Primary Care Physician: [name], MD Admit Date: 05/03/2020  Reason for Consultation/Follow-up: Establishing goals of care  Chart reviewed and updates received from RN.  Subjective: Patient in bed requiring bilateral wrist mittens for line safety.  Extremely restless and agitated.  Continuing to pull out mittens and attempting to get out of bed.  Is alert to self and son.  Denies pain.  Son, Jacqueline Reilly is at the bedside.  He expresses concerns patient was restless throughout the night despite use of Haldol.  Patient attempting to get out of bed, fidgeting with covers and mittens, and continued verbal conversations.  Discussed at length patient's current condition and ongoing comorbidities. Patient son is tearful expressing he has not seen her since March 2020 due to Covid pandemic.  He lives in [place] and due to city shutdown and limited ability to travel he was unable to leave the state and visit her.  Jacqueline Reilly tearful during discussion regarding his mother's poor prognosis. He shares he does not like seeing her in her current state and frailty. Support provided. He shares that he is patient's legal guardian and some potential conflict with patient's nephew Jacqueline Reilly regarding ability to make medical decisions. There is not any documents in place from either relative however in the setting son is primary Media planner. He does express he has documentation but did not think to bring with him but could potentially obtain documents at some point if needed.   Recommendations for outpatient hospice versus residential hospice provided. Son agrees family would not want patient to go to a facility as her wishes have always been to be in her own home. He is requesting patient discharge home with hospice once stable and all  current medical treatments are optimized. Education provided on hospice's goals and philosophy of care. Jacqueline Reilly reports family had attempted to initiate hospice support previously however, patient somewhat improved. He reports at that time he was interested in [facility] [facility] known as [place] (as he remembers). Son made aware a previous message indicated [facility] was planning to arrange for hospice care at discharge, however son is requesting [facility] instead.   Discussed patient's continued restlessness and agitation despite Haldol. Son requesting additional medications for comfort/calmness. Education provided on ativan usage with risk of lethargy. Son verbalized understanding and requesting to attempt to see if it brings patient some relief.   All questions answered and support provided.   1654:Received a call from nephew Jacqueline Reilly, Unable to reach when call returned. Also followed back up to assess patient for restlessness. Son at the bedside and patient resting calmly. Easily aroused with verbal stimuli. Son inquiring about continued milrinone and hospice care. Further education provided regarding discontinuation of medications for comfort in the setting of hospice. He expresses he would like to speak with Dr. Sung Amabile prior to making final decisions on discontinuing milrinone as he knows this will hasten her death. Support given. Additional education on patient's poor prognosis despite milrinone in the setting of renal failure, advanced age, and possible liver mets. Also given patient's restlessness and agitation she will most likely dislodge PICC in a home setting with unsupported restraints. He verbalized understanding.   HPI: Palliative Care consult requested for goals of care discussion in this 86 y.o. female with multiple medical problems including chronic mixed ischemic/nonischemic  cardiomyopathy (EF 25%) with moderate right ventricular systolic dysfunction on milrinone, atrial  fibrillation (amiodarone & apixaban), dementia, anemia, abdominal aortic aneurysm, and hypertension.  Patient presented to the ED from home with complaint of nausea, vomiting, poor oral intake, and lethargy.  Chest x-ray showed possible infiltrates.  CT abdomen pelvis showed possible acute diverticulitis involving transverse colon.  There is also concern for possible metastatic lesions within the liver.  L4 compression fracture.  Patient receiving IV antibiotics for sepsis and possible aspiration pneumonia.  Length of Stay: 3 days  Vital Signs: BP 92/65 (BP Location: Right Arm)   Pulse 84   Temp 97.9 F (36.6 C) (Axillary)   Resp 20   Ht 4\' 9"  (1.448 m)   Wt 43.9 kg   SpO2 100%   BMI 20.94 kg/m  SpO2: SpO2: 100 % O2 Device: O2 Device: Room Air O2 Flow Rate: O2 Flow Rate (L/min): 2 L/min  Physical Exam: -resless, agitated, thin, frail, chronically-ill appearing -tachycardic -normal breathing pattern -alert to self and son, will follow some commands              Palliative Care Assessment & Plan    Code Status:  DNR  Goals of Care/Recommendations:  Continue current plan of care per medical team  Son updated and education provided on patient's poor prognosis, current illness, and co-morbidities. He is requesting to continue current treatments with a goal of patient returning home with outpatient hospice support for comfort. Requesting [facility].   Hospice referral placed (notified Chrislyn, RN ([place] [facility] [place], [place] with TOC).   Ativan PRN for anxiety/agitation  PMT will continue to support and follow  Prognosis: Weeks   Discharge Planning: Home with Hospice   Care plan was discussed with son, Jacqueline Pickerel, RN, and Dr. Horris Latino.   Thank you for allowing the Palliative Medicine Team to assist in the care of this patient.  Time Total: 65 min.   Visit consisted of counseling and education dealing with the complex and emotionally intense issues of symptom  management and palliative care in the setting of serious and potentially life-threatening illness.Greater than 50%  of this time was spent counseling and coordinating care related to the above assessment and plan.  Alda Lea, AGPCNP-BC  Palliative Medicine Team [phone]

## 2020-05-06 NOTE — TOC Progression Note (Signed)
Transition of Care [facility]) - Progression Note    Patient Details  Name: Jacqueline Reilly MRN: [id number] Date of Birth: 1924-05-23  Transition [facility]) CM/SW Contact  Carles Collet, RN Phone Number: 05/06/2020, 1:22 PM  Clinical Narrative:    Plan will be for home with home hospice. Clarified with son [name] home hospice company of choice, it is [facility]. Patient has O2, RW at home, [name] requests [facility], [place] [facility] [place] notified. Per [name] he has CNAs set up 24/7 for patient at home. He confirms DC address on record. He states that CNAs are able to bring transport tank to hospital to provide transport home via private car. Transportation method to be determined closer to DC, patient may need PTAR.  [place], [place] [phone]  [phone]        Expected Discharge Plan: Home w Hospice Care Barriers to Discharge: Continued Medical Work up  Expected Discharge Plan and Services Expected Discharge Plan: [place]   Discharge Planning Services: CM Consult Post Acute Care Choice: [place] arrangements for the past 2 months: [place]: RN [facility] Agency: Hospice and [place] Date [place]: 05/06/20 Time [place]: [place] Representative spoke with at [place]: [place] ([place]) Interventions    Readmission Risk Interventions No flowsheet data found.

## 2020-05-06 NOTE — Progress Notes (Signed)
   05/06/20 1600  Assess: MEWS Score  Temp 97.7 F (36.5 C)  BP (!) 90/48  Pulse Rate 77  ECG Heart Rate 79  Resp (!) 21  Level of Consciousness Alert  SpO2 98 %  O2 Device Room Air  Assess: MEWS Score  MEWS Temp 0  MEWS Systolic 1  MEWS Pulse 0  MEWS RR 1  MEWS LOC 0  MEWS Score 2  MEWS Score Color Yellow  Assess: if the MEWS score is Yellow or Red  Were vital signs taken at a resting state? Yes  Focused Assessment Documented focused assessment  Early Detection of Sepsis Score *See Row Information* Medium  MEWS guidelines implemented *See Row Information* Yes  Treat  MEWS Interventions Escalated (See documentation below)  Take Vital Signs  Increase Vital Sign Frequency  Yellow: Q 2hr X 2 then Q 4hr X 2, if remains yellow, continue Q 4hrs  Escalate  MEWS: Escalate Yellow: discuss with charge nurse/RN and consider discussing with provider and RRT  Notify: Charge Nurse/RN  Name of Charge Nurse/RN Notified Patrici Ranks RN  Date Charge Nurse/RN Notified 05/06/20  Time Charge Nurse/RN Notified 1604  Document  Progress note created (see row info) Yes

## 2020-05-06 NOTE — Progress Notes (Signed)
PROGRESS NOTE  Jacqueline Reilly NLZ:[id number] DOB: April 30, 1924 DOA: 05/03/2020 PCP: [name], MD  HPI/Recap of past 24 hours: HPI from Dr Jacqueline Reilly is a 86 y.o. female with history of chronic mixed ischemic/nonischemic cardiomyopathy last EF was 25% with moderate right ventricular systolic dysfunction on milrinone, atrial fibrillation on amiodarone apixaban with dementia, anemia, abdominal aortic aneurysm was brought to the ER the patient had been having nausea vomiting poor oral intake for the last few days.  Patient also mildly lethargic.  Patient unable to provide any history due to dementia.  Most of the history was obtained from the ER physician who discussed with patient's healthcare power of attorney and also patient's son. In the ER patient had low normal blood pressure in the 90W systolic.  Lab work is significant for acute renal failure and leukocytosis.  Creatinine has worsened from 1.2 in December 2020 and is around 3 and WBC count is 24.7 with a hemoglobin of 8.5.  Chest x-ray shows possible infiltrates.  CT abdomen pelvis is showing possible acute diverticulitis involving the proximal transverse colon.  In addition there is concern for possible metastatic lesions in the liver.  L4 compression fracture also.  Patient was started on empiric antibiotics.  Initially patient was given fluid for 1L for the acute renal failure.  Patient admitted for further management.      Patient seen and examined at bedside.  Noted to be agitated/confused overnight, likely sundowning.  Noted to be resting comfortably.  Son at bedside, had an extensive conversation with son about goals of care.  Son is okay with patient going home with hospice, but continuing antibiotics as well as milrinone.   Assessment/Plan: Active Problems:   Hypertension   AAA (abdominal aortic aneurysm) (HCC)   Atrial fibrillation (HCC)   Chronic anemia   Pulmonary hypertension (HCC)   Acute diverticulitis    AKI (acute kidney injury) ([place])   Sepsis likely 2/2 acute diverticulitis Noted leukocytosis, tachypnea on admission Currently afebrile, with resolving leukocytosis CT abdomen/pelvis showed acute diverticulitis of the proximal transverse colon, no abscess or perforation noted Status post IV fluids, will hold off due to history of cardiomyopathy, encouraged to drink orally, full liquid diet Continue Zosyn Monitor closely  Possible aspiration pneumonia History of coughing and vomiting recently Chest x-ray with possible infiltrate within the right upper lobe and left lung base SLP on board Full liquid diet Continue IV Zosyn  AKI on CKD stage IIIb/metabolic acidosis/anion gap Baseline creatinine 1.2-1.4, on admission 3.09 Status post gentle hydration Hold home diuretics Daily BMP  Anemia of chronic kidney disease/iron deficiency anemia Baseline hemoglobin around 10, on admission 8.5 Anemia panel showed iron 10, sats 3 Give 1 dose of Feraheme IV on 05/05/2020 P.o. iron supplementation No obvious signs of bleeding Daily CBC   Hypokalemia Replace as needed  History of chronic cardiomyopathy/chronic systolic HF Chronic hypoxic respiratory failure Appears dry Last echo with EF of 25% Discussed with Dr. [name] HF, recommend continuing milrinone for now and holding off all diuretics Continue milrinone  History of paroxysmal A. Fib Heart rate controlled Hold home amiodarone for now Continue home Eliquis  Possible metastatic lesions in liver CT abdomen showed innumerable hepatic hypodense masses consistent with metastatic disease Discussed with family, no further aggressive work-up  L4 compression fracture CT abdomen/pelvis showed L4 compression fracture possibly acute or subacute Notified neurosurgery, nonoperative management, back brace ordered for patient to use upon standing, sitting up and ambulation Pain management  History of  AAA 4.2 cm infrarenal abdominal aortic  aneurysm increased in size To further evaluate CT with IV contrast recommended, patient with AKI, can be pursued as an outpatient  Dementia Delirium precautions  Goals of care discussion Patient with very poor prognosis, advanced age, multiple comorbidities Palliative care consulted Discussed with son, recommend home hospice        Malnutrition Type:      Malnutrition Characteristics:      Nutrition Interventions:       Estimated body mass index is 20.94 kg/m as calculated from the following:   Height as of this encounter: 4\' 9"  (1.448 m).   Weight as of this encounter: 43.9 kg.     Code Status: DNR   Family Communication: Discussed with son on 05/06/2020   Disposition Plan: Status is: Inpatient  Remains inpatient appropriate because:Inpatient level of care appropriate due to severity of illness   Dispo: The patient is from: Home (has 24-hour care)               Anticipated d/c is to: Likely home hospice              Anticipated d/c date is: 1 day              Patient currently is not medically stable to d/c.    Consultants:  None  Procedures:  None  Antimicrobials:  Zosyn  DVT prophylaxis: Eliquis   Objective: Vitals:   05/05/20 2335 05/06/20 0746 05/06/20 1118 05/06/20 1600  BP: 92/65   (!) 90/48  Pulse: 84   77  Resp: 20   (!) 21  Temp: 97.8 F (36.6 C) (!) 97.3 F (36.3 C) 97.9 F (36.6 C) 97.7 F (36.5 C)  TempSrc: Axillary Axillary Axillary Axillary  SpO2: 100%   98%  Weight:      Height:        Intake/Output Summary (Last 24 hours) at 05/06/2020 1744 Last data filed at 05/06/2020 1600 Gross per 24 hour  Intake 702.17 ml  Output 800 ml  Net -97.83 ml   Filed Weights   05/04/20 0000 05/05/20 0402  Weight: 45.9 kg 43.9 kg    Exam:  General: NAD, pleasantly confused  Cardiovascular: S1, S2 present  Respiratory: CTAB  Abdomen: Soft, nontender, nondistended, bowel sounds present  Musculoskeletal: No bilateral  pedal edema noted  Skin: Normal  Psychiatry:  Unable to assess    Data Reviewed: CBC: Recent Labs  Lab 05/03/20 1718 05/04/20 0415 05/05/20 0701 05/06/20 0311  WBC 24.7* 19.8* 14.5* 12.9*  NEUTROABS 22.7*  --  12.3* 10.9*  HGB 8.5* 8.3* 9.0* 8.5*  HCT 29.2* 28.5* 32.0* 29.5*  MCV 70.7* 71.8* 71.6* 71.3*  PLT 478* 386 483* 761*   Basic Metabolic Panel: Recent Labs  Lab 05/03/20 1718 05/04/20 0415 05/05/20 0701 05/06/20 0311  NA 135 139 140 140  K 3.5 3.6 3.1* 4.0  CL 100 105 104 105  CO2 21* 18* 19* 20*  GLUCOSE 129* 118* 120* 118*  BUN 99* 93* 86* 81*  CREATININE 3.09* 2.93* 2.97* 2.74*  CALCIUM 9.0 8.7* 9.1 9.1   GFR: Estimated Creatinine Clearance: 7.7 mL/min (A) (by C-G formula based on SCr of 2.74 mg/dL (H)). Liver Function Tests: Recent Labs  Lab 05/03/20 1718 05/05/20 0701 05/06/20 0311  AST 40 33 30  ALT 22 20 17   ALKPHOS 155* 141* 127*  BILITOT 0.7 1.0 1.0  PROT 6.0* 6.0* 6.1*  ALBUMIN 2.7* 2.5* 2.4*   No results for input(s):  LIPASE, AMYLASE in the last 168 hours. No results for input(s): AMMONIA in the last 168 hours. Coagulation Profile: No results for input(s): INR, PROTIME in the last 168 hours. Cardiac Enzymes: No results for input(s): CKTOTAL, CKMB, CKMBINDEX, TROPONINI in the last 168 hours. BNP (last 3 results) No results for input(s): PROBNP in the last 8760 hours. HbA1C: No results for input(s): HGBA1C in the last 72 hours. CBG: Recent Labs  Lab 05/05/20 0032 05/05/20 0731 05/05/20 1803 05/05/20 2336 05/06/20 1703  GLUCAP 186* 123* 124* 120* 147*   Lipid Profile: No results for input(s): CHOL, HDL, LDLCALC, TRIG, CHOLHDL, LDLDIRECT in the last 72 hours. Thyroid Function Tests: No results for input(s): TSH, T4TOTAL, FREET4, T3FREE, THYROIDAB in the last 72 hours. Anemia Panel: Recent Labs    05/05/20 0701  VITAMINB12 1,142*  FOLATE 36.8  FERRITIN 32  TIBC 393  IRON 10*   Urine analysis:    Component Value  Date/Time   COLORURINE YELLOW 05/03/2020 2139   APPEARANCEUR TURBID (A) 05/03/2020 2139   LABSPEC 1.010 05/03/2020 2139   PHURINE 6.0 05/03/2020 2139   GLUCOSEU NEGATIVE 05/03/2020 2139   HGBUR MODERATE (A) 05/03/2020 2139   [place] NEGATIVE 05/03/2020 2139   [place] NEGATIVE 05/03/2020 2139   PROTEINUR 30 (A) 05/03/2020 2139   UROBILINOGEN 0.2 09/01/2015 1254   NITRITE NEGATIVE 05/03/2020 2139   LEUKOCYTESUR LARGE (A) 05/03/2020 2139   Sepsis Labs: @LABRCNTIP (procalcitonin:4,lacticidven:4)  ) Recent Results (from the past 240 hour(s))  Blood culture (routine x 2)     Status: None (Preliminary result)   Collection Time: 05/03/20  8:24 PM   Specimen: BLOOD  Result Value Ref Range Status   Specimen Description BLOOD RIGHT WRIST  Final   Special Requests   Final    BOTTLES DRAWN AEROBIC AND ANAEROBIC Blood Culture results may not be optimal due to an inadequate volume of blood received in culture bottles   Culture   Final    NO GROWTH 3 DAYS Performed at [facility], [place] [address] [place], [place] [address]    Report Status PENDING  Incomplete  Urine culture     Status: Abnormal   Collection Time: 05/03/20 10:11 PM   Specimen: Urine, Random  Result Value Ref Range Status   Specimen Description URINE, RANDOM  Final   Special Requests   Final    NONE Performed at [facility], [place] [address] [place], [place] [address]    Culture >=100,000 COLONIES/mL ESCHERICHIA COLI (A)  Final   Report Status 05/05/2020 FINAL  Final   Organism ID, Bacteria ESCHERICHIA COLI (A)  Final      Susceptibility   Escherichia coli - MIC*    AMPICILLIN <=2 SENSITIVE Sensitive     CEFAZOLIN <=4 SENSITIVE Sensitive     CEFTRIAXONE <=0.25 SENSITIVE Sensitive     CIPROFLOXACIN <=0.25 SENSITIVE Sensitive     GENTAMICIN <=1 SENSITIVE Sensitive     IMIPENEM <=0.25 SENSITIVE Sensitive     NITROFURANTOIN <=16 SENSITIVE Sensitive     TRIMETH/SULFA <=20 SENSITIVE Sensitive      AMPICILLIN/SULBACTAM <=2 SENSITIVE Sensitive     PIP/TAZO <=4 SENSITIVE Sensitive     * >=100,000 COLONIES/mL ESCHERICHIA COLI  SARS Coronavirus 2 by RT PCR (hospital order, performed in [place] hospital lab) Nasopharyngeal Nasopharyngeal Swab     Status: None   Collection Time: 05/03/20 10:38 PM   Specimen: Nasopharyngeal Swab  Result Value Ref Range Status   SARS Coronavirus 2 NEGATIVE NEGATIVE Final  Comment: (NOTE) SARS-CoV-2 target nucleic acids are NOT DETECTED.  The SARS-CoV-2 RNA is generally detectable in upper and lower respiratory specimens during the acute phase of infection. The lowest concentration of SARS-CoV-2 viral copies this assay can detect is 250 copies / mL. A negative result does not preclude SARS-CoV-2 infection and should not be used as the sole basis for treatment or other patient management decisions.  A negative result may occur with improper specimen collection / handling, submission of specimen other than nasopharyngeal swab, presence of viral mutation(s) within the areas targeted by this assay, and inadequate number of viral copies (<250 copies / mL). A negative result must be combined with clinical observations, patient history, and epidemiological information.  Fact Sheet for Patients:   StrictlyIdeas.no  Fact Sheet for Healthcare Providers: BankingDealers.co.za  This test is not yet approved or  cleared by the [place] FDA and has been authorized for detection and/or diagnosis of SARS-CoV-2 by FDA under an Emergency Use Authorization (EUA).  This EUA will remain in effect (meaning this test can be used) for the duration of the COVID-19 declaration under Section 564(b)(1) of the Act, 21 U.S.C. section 360bbb-3(b)(1), unless the authorization is terminated or revoked sooner.  Performed at [facility], [place] [address], [place], [place] [address]       Studies: No results  found.  Scheduled Meds: . apixaban  2.5 mg Oral BID  . busPIRone  5 mg Oral BID  . Chlorhexidine Gluconate Cloth  6 each Topical Daily  . OLANZapine  2.5 mg Oral Q supper  . sodium chloride flush  10-40 mL Intracatheter Q12H  . vancomycin variable dose per unstable renal function (pharmacist dosing)   Does not apply See admin instructions    Continuous Infusions: . milrinone 0.125 mcg/kg/min (05/05/20 2258)  . piperacillin-tazobactam (ZOSYN)  IV 2.25 g (05/06/20 1500)     LOS: 3 days     Alma Friendly, MD [facility]  If 7PM-7AM, please contact night-coverage [email or web address] 05/06/2020, 5:44 PM

## 2020-05-06 NOTE — Progress Notes (Addendum)
Hydrologist [facility]) Hospital Liaison: RN note    Notified by Transition of Care Manger of patient/family request for [facility] services at home after discharge. Hospice eligibility approved by Dr. Tomasa Hosteller [facility] physician).   Spoke with son  [name] who is POA to initiate education related to hospice philosophy, services and team approach to care. [name]          verbalized understanding of information given.  Per discussion, plan is for discharge to home by [facility]) possibly Monday 7/12.    Please send signed and completed DNR form home with patient/family. Patient will need prescriptions for discharge comfort medications.     DME needs have been discussed, patient currently has the following equipment in the home:walker and oxygen.  Patient/family requests the following DME for delivery to the home:  None.    [facility] aware of the above. Please notify [facility] when patient is ready to leave the unit at discharge. (Call [phone] or [phone] after 5pm.) [facility] information and contact numbers given to [facility].      Please call with any hospice related questions.     Thank you for this referral.     Clementeen Hoof, RN, [facility] (listed on [facility] under Hospice and [place] [facility] [place])  [phone]

## 2020-05-07 LAB — COMPREHENSIVE METABOLIC PANEL
ALT: 17 U/L (ref 0–44)
AST: 31 U/L (ref 15–41)
Albumin: 2.2 g/dL — ABNORMAL LOW (ref 3.5–5.0)
Alkaline Phosphatase: 120 U/L (ref 38–126)
Anion gap: 11 (ref 5–15)
BUN: 79 mg/dL — ABNORMAL HIGH (ref 8–23)
CO2: 25 mmol/L (ref 22–32)
Calcium: 9.2 mg/dL (ref 8.9–10.3)
Chloride: 104 mmol/L (ref 98–111)
Creatinine, Ser: 3.17 mg/dL — ABNORMAL HIGH (ref 0.44–1.00)
GFR calc Af Amer: 14 mL/min — ABNORMAL LOW (ref >60)
GFR calc non Af Amer: 12 mL/min — ABNORMAL LOW (ref >60)
Glucose, Bld: 128 mg/dL — ABNORMAL HIGH (ref 70–99)
Potassium: 3.9 mmol/L (ref 3.5–5.1)
Sodium: 140 mmol/L (ref 135–145)
Total Bilirubin: 0.4 mg/dL (ref 0.3–1.2)
Total Protein: 5.6 g/dL — ABNORMAL LOW (ref 6.5–8.1)

## 2020-05-07 LAB — CBC WITH DIFFERENTIAL/PLATELET
Abs Immature Granulocytes: 0.08 10*3/uL — ABNORMAL HIGH (ref 0.00–0.07)
Basophils Absolute: 0 10*3/uL (ref 0.0–0.1)
Basophils Relative: 0 %
Eosinophils Absolute: 0.4 10*3/uL (ref 0.0–0.5)
Eosinophils Relative: 4 %
HCT: 28.7 % — ABNORMAL LOW (ref 36.0–46.0)
Hemoglobin: 8.3 g/dL — ABNORMAL LOW (ref 12.0–15.0)
Immature Granulocytes: 1 %
Lymphocytes Relative: 7 %
Lymphs Abs: 0.8 10*3/uL (ref 0.7–4.0)
MCH: 21.1 pg — ABNORMAL LOW (ref 26.0–34.0)
MCHC: 28.9 g/dL — ABNORMAL LOW (ref 30.0–36.0)
MCV: 72.8 fL — ABNORMAL LOW (ref 80.0–100.0)
Monocytes Absolute: 0.8 10*3/uL (ref 0.1–1.0)
Monocytes Relative: 7 %
Neutro Abs: 9.2 10*3/uL — ABNORMAL HIGH (ref 1.7–7.7)
Neutrophils Relative %: 81 %
Platelets: 418 10*3/uL — ABNORMAL HIGH (ref 150–400)
RBC: 3.94 MIL/uL (ref 3.87–5.11)
RDW: 18.6 % — ABNORMAL HIGH (ref 11.5–15.5)
WBC: 11.2 10*3/uL — ABNORMAL HIGH (ref 4.0–10.5)
nRBC: 0 % (ref 0.0–0.2)

## 2020-05-07 LAB — GASTROINTESTINAL PANEL BY PCR, STOOL (REPLACES STOOL CULTURE)

## 2020-05-07 LAB — GLUCOSE, CAPILLARY
Glucose-Capillary: 122 mg/dL — ABNORMAL HIGH (ref 70–99)
Glucose-Capillary: 108 mg/dL — ABNORMAL HIGH (ref 70–99)
Glucose-Capillary: 106 mg/dL — ABNORMAL HIGH (ref 70–99)
Glucose-Capillary: 116 mg/dL — ABNORMAL HIGH (ref 70–99)

## 2020-05-07 MED ORDER — PIPERACILLIN-TAZOBACTAM IN DEX 2-0.25 GM/50ML IV SOLN
2.2500 g | Freq: Once | INTRAVENOUS | Status: AC
  Administered 2020-05-07: 2.25 g via INTRAVENOUS
  Filled 2020-05-07: qty 50

## 2020-05-07 MED ORDER — AMOXICILLIN-POT CLAVULANATE 500-125 MG PO TABS: 1.0000 | ORAL_TABLET | Freq: Every day | ORAL | Status: DC

## 2020-05-07 MED ORDER — DEXTROSE-NACL 5-0.45 % IV SOLN: INTRAVENOUS | Status: AC

## 2020-05-07 NOTE — Progress Notes (Signed)
PROGRESS NOTE  Jacqueline Reilly GYJ:[id number] DOB: 04/06/1924 DOA: 05/03/2020 PCP: [name], MD  HPI/Recap of past 24 hours: HPI from Dr [name] is a 86 y.o. female with history of chronic mixed ischemic/nonischemic cardiomyopathy last EF was 25% with moderate right ventricular systolic dysfunction on milrinone, atrial fibrillation on amiodarone apixaban with dementia, anemia, abdominal aortic aneurysm was brought to the ER the patient had been having nausea vomiting poor oral intake for the last few days.  Patient also mildly lethargic.  Patient unable to provide any history due to dementia.  Most of the history was obtained from the ER physician who discussed with patient's healthcare power of attorney and also patient's son. In the ER patient had low normal blood pressure in the 26V systolic.  Lab work is significant for acute renal failure and leukocytosis.  Creatinine has worsened from 1.2 in December 2020 and is around 3 and WBC count is 24.7 with a hemoglobin of 8.5.  Chest x-ray shows possible infiltrates.  CT abdomen pelvis is showing possible acute diverticulitis involving the proximal transverse colon.  In addition there is concern for possible metastatic lesions in the liver.  L4 compression fracture also.  Patient was started on empiric antibiotics.  Initially patient was given fluid for 1L for the acute renal failure.  Patient admitted for further management.      Patient seen and examined at bedside.  Noted to be resting comfortably.  Son at bedside also sleeping on the couch.     Assessment/Plan: Active Problems:   Hypertension   AAA (abdominal aortic aneurysm) (HCC)   Atrial fibrillation (HCC)   Chronic anemia   Pulmonary hypertension (HCC)   Acute diverticulitis   AKI (acute kidney injury) ([place])   Sepsis likely 2/2 acute diverticulitis Noted leukocytosis, tachypnea on admission Currently afebrile, with resolving leukocytosis CT abdomen/pelvis  showed acute diverticulitis of the proximal transverse colon, no abscess or perforation noted Status post IV fluids, will hold off due to history of cardiomyopathy, encouraged to drink orally, full liquid diet Switch Zosyn to PO augmentin Monitor closely  Possible aspiration pneumonia History of coughing and vomiting recently Chest x-ray with possible infiltrate within the right upper lobe and left lung base SLP on board Full liquid diet Switch to PO Augmentin  E.coli UTI Urine culture grew E. coli pansensitive Continue Augmentin  AKI on CKD stage IIIb/metabolic acidosis/anion gap Worsening Baseline creatinine 1.2-1.4, on admission 3.09 Status post gentle hydration Hold home diuretics Daily BMP  Anemia of chronic kidney disease/iron deficiency anemia Baseline hemoglobin around 10, on admission 8.5 Anemia panel showed iron 10, sats 3 Give 1 dose of Feraheme IV on 05/05/2020 P.o. iron supplementation No obvious signs of bleeding Daily CBC   Hypokalemia Replace as needed  History of chronic cardiomyopathy/chronic systolic HF Chronic hypoxic respiratory failure Last echo with EF of 25% Discussed with Dr. [name] HF, recommend continuing milrinone for now and holding off all diuretics Continue milrinone  History of paroxysmal A. Fib Heart rate controlled Hold home amiodarone for now Continue home Eliquis  Possible metastatic lesions in liver CT abdomen showed innumerable hepatic hypodense masses consistent with metastatic disease Discussed with family, no further aggressive work-up  L4 compression fracture CT abdomen/pelvis showed L4 compression fracture possibly acute or subacute Notified neurosurgery, nonoperative management, back brace ordered for patient to use upon standing, sitting up and ambulation Pain management  History of AAA 4.2 cm infrarenal abdominal aortic aneurysm increased in size To further evaluate CT  with IV contrast recommended, patient with AKI,  can be pursued as an outpatient  Dementia Delirium precautions  Goals of care discussion Patient with very poor prognosis, advanced age, multiple comorbidities Palliative care consulted Discussed with son, recommend home hospice-but son still wants to continue milrinone.  Wants to talk to cardiology.        Malnutrition Type:      Malnutrition Characteristics:      Nutrition Interventions:       Estimated body mass index is 20.28 kg/m as calculated from the following:   Height as of this encounter: 4\' 9"  (1.448 m).   Weight as of this encounter: 42.5 kg.     Code Status: DNR   Family Communication: Discussed with son on 05/06/2020   Disposition Plan: Status is: Inpatient  Remains inpatient appropriate because:Inpatient level of care appropriate due to severity of illness   Dispo: The patient is from: Home (has 24-hour care)               Anticipated d/c is to: Likely home hospice              Anticipated d/c date is: 1 day              Patient currently is not medically stable to d/c.    Consultants:  None  Procedures:  None  Antimicrobials:  Augmentin  DVT prophylaxis: Eliquis   Objective: Vitals:   05/07/20 0500 05/07/20 0754 05/07/20 1200 05/07/20 1217  BP:  (!) 94/51 (!) 81/45 (!) 87/41  Pulse:  76 69 77  Resp:  19    Temp:  97.9 F (36.6 C) (!) 97.4 F (36.3 C) 97.6 F (36.4 C)  TempSrc:  Axillary Oral   SpO2:  98% (!) 83% 91%  Weight: 42.5 kg     Height:        Intake/Output Summary (Last 24 hours) at 05/07/2020 1522 Last data filed at 05/07/2020 1339 Gross per 24 hour  Intake 520.39 ml  Output 800 ml  Net -279.61 ml   Filed Weights   05/04/20 0000 05/05/20 0402 05/07/20 0500  Weight: 45.9 kg 43.9 kg 42.5 kg    Exam:  General: NAD, pleasantly confused  Cardiovascular: S1, S2 present  Respiratory: CTAB  Abdomen: Soft, nontender, nondistended, bowel sounds present  Musculoskeletal: No bilateral pedal edema  noted  Skin: Normal  Psychiatry:  Unable to assess    Data Reviewed: CBC: Recent Labs  Lab 05/03/20 1718 05/04/20 0415 05/05/20 0701 05/06/20 0311 05/07/20 0415  WBC 24.7* 19.8* 14.5* 12.9* 11.2*  NEUTROABS 22.7*  --  12.3* 10.9* 9.2*  HGB 8.5* 8.3* 9.0* 8.5* 8.3*  HCT 29.2* 28.5* 32.0* 29.5* 28.7*  MCV 70.7* 71.8* 71.6* 71.3* 72.8*  PLT 478* 386 483* 462* 160*   Basic Metabolic Panel: Recent Labs  Lab 05/03/20 1718 05/04/20 0415 05/05/20 0701 05/06/20 0311 05/07/20 0415  NA 135 139 140 140 140  K 3.5 3.6 3.1* 4.0 3.9  CL 100 105 104 105 104  CO2 21* 18* 19* 20* 25  GLUCOSE 129* 118* 120* 118* 128*  BUN 99* 93* 86* 81* 79*  CREATININE 3.09* 2.93* 2.97* 2.74* 3.17*  CALCIUM 9.0 8.7* 9.1 9.1 9.2   GFR: Estimated Creatinine Clearance: 6.6 mL/min (A) (by C-G formula based on SCr of 3.17 mg/dL (H)). Liver Function Tests: Recent Labs  Lab 05/03/20 1718 05/05/20 0701 05/06/20 0311 05/07/20 0415  AST 40 33 30 31  ALT 22 20 17 17   ALKPHOS  155* 141* 127* 120  BILITOT 0.7 1.0 1.0 0.4  PROT 6.0* 6.0* 6.1* 5.6*  ALBUMIN 2.7* 2.5* 2.4* 2.2*   No results for input(s): LIPASE, AMYLASE in the last 168 hours. No results for input(s): AMMONIA in the last 168 hours. Coagulation Profile: No results for input(s): INR, PROTIME in the last 168 hours. Cardiac Enzymes: No results for input(s): CKTOTAL, CKMB, CKMBINDEX, TROPONINI in the last 168 hours. BNP (last 3 results) No results for input(s): PROBNP in the last 8760 hours. HbA1C: No results for input(s): HGBA1C in the last 72 hours. CBG: Recent Labs  Lab 05/05/20 1803 05/05/20 2336 05/06/20 1703 05/06/20 2328 05/07/20 0801  GLUCAP 124* 120* 147* 127* 122*   Lipid Profile: No results for input(s): CHOL, HDL, LDLCALC, TRIG, CHOLHDL, LDLDIRECT in the last 72 hours. Thyroid Function Tests: No results for input(s): TSH, T4TOTAL, FREET4, T3FREE, THYROIDAB in the last 72 hours. Anemia Panel: Recent Labs     05/05/20 0701  VITAMINB12 1,142*  FOLATE 36.8  FERRITIN 32  TIBC 393  IRON 10*   Urine analysis:    Component Value Date/Time   COLORURINE YELLOW 05/03/2020 2139   APPEARANCEUR TURBID (A) 05/03/2020 2139   LABSPEC 1.010 05/03/2020 2139   PHURINE 6.0 05/03/2020 2139   GLUCOSEU NEGATIVE 05/03/2020 2139   HGBUR MODERATE (A) 05/03/2020 2139   BILIRUBINUR NEGATIVE 05/03/2020 2139   KETONESUR NEGATIVE 05/03/2020 2139   PROTEINUR 30 (A) 05/03/2020 2139   UROBILINOGEN 0.2 09/01/2015 1254   NITRITE NEGATIVE 05/03/2020 2139   LEUKOCYTESUR LARGE (A) 05/03/2020 2139   Sepsis Labs: @LABRCNTIP (procalcitonin:4,lacticidven:4)  ) Recent Results (from the past 240 hour(s))  Gastrointestinal Panel by PCR , Stool     Status: None   Collection Time: 05/03/20  4:40 PM   Specimen: Stool  Result Value Ref Range Status   Campylobacter species NOT DETECTED NOT DETECTED Final   Plesimonas shigelloides NOT DETECTED NOT DETECTED Final   Salmonella species NOT DETECTED NOT DETECTED Final   Yersinia enterocolitica NOT DETECTED NOT DETECTED Final   Vibrio species NOT DETECTED NOT DETECTED Final   Vibrio cholerae NOT DETECTED NOT DETECTED Final   Enteroaggregative E coli (EAEC) NOT DETECTED NOT DETECTED Final   Enteropathogenic E coli (EPEC) NOT DETECTED NOT DETECTED Final   Enterotoxigenic E coli (ETEC) NOT DETECTED NOT DETECTED Final   Shiga like toxin producing E coli (STEC) NOT DETECTED NOT DETECTED Final   Shigella/Enteroinvasive E coli (EIEC) NOT DETECTED NOT DETECTED Final   Cryptosporidium NOT DETECTED NOT DETECTED Final   Cyclospora cayetanensis NOT DETECTED NOT DETECTED Final   Entamoeba histolytica NOT DETECTED NOT DETECTED Final   Giardia lamblia NOT DETECTED NOT DETECTED Final   Adenovirus F40/41 NOT DETECTED NOT DETECTED Final   Astrovirus NOT DETECTED NOT DETECTED Final   Norovirus GI/GII NOT DETECTED NOT DETECTED Final   Rotavirus A NOT DETECTED NOT DETECTED Final   Sapovirus (I,  II, IV, and V) NOT DETECTED NOT DETECTED Final    Comment: Performed at [facility], [address] [place], [place] [address]  Blood culture (routine x 2)     Status: None (Preliminary result)   Collection Time: 05/03/20  8:24 PM   Specimen: BLOOD  Result Value Ref Range Status   Specimen Description BLOOD RIGHT WRIST  Final   Special Requests   Final    BOTTLES DRAWN AEROBIC AND ANAEROBIC Blood Culture results may not be optimal due to an inadequate volume of blood received in culture bottles  Culture   Final    NO GROWTH 4 DAYS Performed at [facility], [place] [address], [place], [place] [address]    Report Status PENDING  Incomplete  Urine culture     Status: Abnormal   Collection Time: 05/03/20 10:11 PM   Specimen: Urine, Random  Result Value Ref Range Status   Specimen Description URINE, RANDOM  Final   Special Requests   Final    NONE Performed at [facility], [place] [address] [place], [place] [address]    Culture >=100,000 COLONIES/mL ESCHERICHIA COLI (A)  Final   Report Status 05/05/2020 FINAL  Final   Organism ID, Bacteria ESCHERICHIA COLI (A)  Final      Susceptibility   Escherichia coli - MIC*    AMPICILLIN <=2 SENSITIVE Sensitive     CEFAZOLIN <=4 SENSITIVE Sensitive     CEFTRIAXONE <=0.25 SENSITIVE Sensitive     CIPROFLOXACIN <=0.25 SENSITIVE Sensitive     GENTAMICIN <=1 SENSITIVE Sensitive     IMIPENEM <=0.25 SENSITIVE Sensitive     NITROFURANTOIN <=16 SENSITIVE Sensitive     TRIMETH/SULFA <=20 SENSITIVE Sensitive     AMPICILLIN/SULBACTAM <=2 SENSITIVE Sensitive     PIP/TAZO <=4 SENSITIVE Sensitive     * >=100,000 COLONIES/mL ESCHERICHIA COLI  SARS Coronavirus 2 by RT PCR (hospital order, performed in [place] hospital lab) Nasopharyngeal Nasopharyngeal Swab     Status: None   Collection Time: 05/03/20 10:38 PM   Specimen: Nasopharyngeal Swab  Result Value Ref Range Status   SARS Coronavirus 2 NEGATIVE NEGATIVE Final    Comment:  (NOTE) SARS-CoV-2 target nucleic acids are NOT DETECTED.  The SARS-CoV-2 RNA is generally detectable in upper and lower respiratory specimens during the acute phase of infection. The lowest concentration of SARS-CoV-2 viral copies this assay can detect is 250 copies / mL. A negative result does not preclude SARS-CoV-2 infection and should not be used as the sole basis for treatment or other patient management decisions.  A negative result may occur with improper specimen collection / handling, submission of specimen other than nasopharyngeal swab, presence of viral mutation(s) within the areas targeted by this assay, and inadequate number of viral copies (<250 copies / mL). A negative result must be combined with clinical observations, patient history, and epidemiological information.  Fact Sheet for Patients:   StrictlyIdeas.no  Fact Sheet for Healthcare Providers: BankingDealers.co.za  This test is not yet approved or  cleared by the [place] FDA and has been authorized for detection and/or diagnosis of SARS-CoV-2 by FDA under an Emergency Use Authorization (EUA).  This EUA will remain in effect (meaning this test can be used) for the duration of the COVID-19 declaration under Section 564(b)(1) of the Act, 21 U.S.C. section 360bbb-3(b)(1), unless the authorization is terminated or revoked sooner.  Performed at [facility], [place] [address], [place], [place] [address]   MRSA PCR Screening     Status: None   Collection Time: 05/06/20 12:02 PM   Specimen: Nasopharyngeal  Result Value Ref Range Status   MRSA by PCR NEGATIVE NEGATIVE Final    Comment:        The GeneXpert MRSA Assay (FDA approved for NASAL specimens only), is one component of a comprehensive MRSA colonization surveillance program. It is not intended to diagnose MRSA infection nor to guide or monitor treatment for MRSA infections. Performed at [facility], [place] [address] [place], [place] [address]  Studies: No results found.  Scheduled Meds: . [START ON 05/08/2020] amoxicillin-clavulanate  1 tablet Oral Daily  . apixaban  2.5 mg Oral BID  . busPIRone  5 mg Oral BID  . Chlorhexidine Gluconate Cloth  6 each Topical Daily  . OLANZapine  2.5 mg Oral Q supper  . sodium chloride flush  10-40 mL Intracatheter Q12H    Continuous Infusions: . milrinone 0.125 mcg/kg/min (05/05/20 2258)     LOS: 4 days     Alma Friendly, MD [facility]  If 7PM-7AM, please contact night-coverage [email or web address] 05/07/2020, 3:22 PM

## 2020-05-07 NOTE — Progress Notes (Signed)
°   05/07/20 1604  Assess: MEWS Score  Temp 97.8 F (36.6 C)  BP (!) 89/45  Pulse Rate 76  ECG Heart Rate 74  Resp 16  Level of Consciousness Responds to Voice  SpO2 94 %  O2 Device Nasal Cannula  O2 Flow Rate (L/min) 3.5 L/min  Assess: if the MEWS score is Yellow or Red  Were vital signs taken at a resting state? Yes  Focused Assessment Documented focused assessment  Early Detection of Sepsis Score *See Row Information* Low  MEWS guidelines implemented *See Row Information* Yes  Treat  MEWS Interventions Escalated (See documentation below)  Take Vital Signs  Increase Vital Sign Frequency  Yellow: Q 2hr X 2 then Q 4hr X 2, if remains yellow, continue Q 4hrs  Escalate  MEWS: Escalate Yellow: discuss with charge nurse/RN and consider discussing with provider and RRT  Notify: Charge Nurse/RN  Name of Charge Nurse/RN Notified Philippa Chester, RN  Date Charge Nurse/RN Notified 05/07/20  Time Charge Nurse/RN Notified 8502  Notify: Provider  Provider Name/Title Horris Latino, MD  Date Provider Notified 05/07/20  Time Provider Notified 13  Notification Type Page  Notification Reason Other (Comment) (pt sleepy since AM with short periods when she was alert)  Notify: Rapid Response  Name of Rapid Response RN Notified N/A  Document  Patient Outcome Other (Comment) (pt DNR; possible D/C home with hospice on 05/08/2020)  Progress note created (see row info) Yes  Patient sleepy since this AM with short periods when she was awake; patient DNR and possible going home with hospice on 07/12/202, per MD notes. MD is aware and also family aware of her condition. Will continue to monitor.

## 2020-05-07 NOTE — Progress Notes (Signed)
Pharmacy Antibiotic Note  Jacqueline Reilly is a 86 y.o. female admitted on 05/03/2020 with sepsis d/t acute diverticulitis and possible pneumonia. Pharmacy has been consulted for Zosyn dosing until able to reliably take po meds.   Plan: Continue zosyn as previously ordered Follow up change to augmentin when able.   Height: 4\' 9"  (144.8 cm) Weight: 42.5 kg (93 lb 11.1 oz) IBW/kg (Calculated) : 38.6  Temp (24hrs), Avg:97.7 F (36.5 C), Min:97.4 F (36.3 C), Max:97.9 F (36.6 C)  Recent Labs  Lab 05/03/20 1718 05/03/20 1755 05/03/20 2024 05/04/20 0415 05/05/20 0701 05/06/20 0311 05/07/20 0415  WBC 24.7*  --   --  19.8* 14.5* 12.9* 11.2*  CREATININE 3.09*  --   --  2.93* 2.97* 2.74* 3.17*  LATICACIDVEN  --  1.1 0.9  --   --   --   --     Estimated Creatinine Clearance: 6.6 mL/min (A) (by C-G formula based on SCr of 3.17 mg/dL (H)).    Allergies  Allergen Reactions  . Tape Other (See Comments)    SKIN WILL TEAR EASILY!!!!  . Codeine Nausea Only  . Colchicine Diarrhea  . Garlic Nausea Only   Erin Hearing PharmD., BCPS Clinical Pharmacist 05/07/2020 6:59 PM

## 2020-05-07 NOTE — Progress Notes (Signed)
Hydrologist [facility]) Hospital Liaison: RN note    Notified by Transition of Care Manger of patient/family request for [facility] services at home after discharge. Hospice eligibility approved by Dr. Tomasa Hosteller [facility] physician) pending on decision of milrinone.    Spoke with son  [name] who is POA to initiate education related to hospice philosophy, services and team approach to care. [name] verbalized understanding of information given, however is concerned about stopping the milrinone medication. I explained to him that this is a life sustaining medication that is not covered under hospice services but we would gladly offer palliative if he wanted to continue with the medication and treatment path. [name] stated he wanted to meet with patients cardiologist in the morning and then decide which direction he wants for his mom. [facility] will follow up in the morning with son [name].   Per discussion, plan is for discharge to home by [facility] Harold) possibly Monday 7/12.    Please send signed and completed DNR form home with patient/family. Patient will need prescriptions for discharge comfort medications.     DME needs have been discussed, patient currently has the following equipment in the home:walker and oxygen.  Patient/family requests the following DME for delivery to the home:  None.    [facility] aware of the above. Please notify [facility] when patient is ready to leave the unit at discharge. (Call [phone] or [phone] after 5pm.) [facility] information and contact numbers given to [facility].      Please call with any hospice related questions.     Thank you for this referral.     Clementeen Hoof, RN, [facility] (listed on [place] under [facility]ospice and [facility])  [phone]

## 2020-05-08 ENCOUNTER — Telehealth (HOSPITAL_COMMUNITY): Payer: Self-pay | Admitting: *Deleted

## 2020-05-08 DIAGNOSIS — K5792 Diverticulitis of intestine, part unspecified, without perforation or abscess without bleeding: Secondary | ICD-10-CM

## 2020-05-08 LAB — COMPREHENSIVE METABOLIC PANEL
ALT: 18 U/L (ref 0–44)
AST: 34 U/L (ref 15–41)
Albumin: 2.3 g/dL — ABNORMAL LOW (ref 3.5–5.0)
Alkaline Phosphatase: 133 U/L — ABNORMAL HIGH (ref 38–126)
Anion gap: 14 (ref 5–15)
BUN: 84 mg/dL — ABNORMAL HIGH (ref 8–23)
CO2: 21 mmol/L — ABNORMAL LOW (ref 22–32)
Calcium: 9.2 mg/dL (ref 8.9–10.3)
Chloride: 104 mmol/L (ref 98–111)
Creatinine, Ser: 4.5 mg/dL — ABNORMAL HIGH (ref 0.44–1.00)
GFR calc Af Amer: 9 mL/min — ABNORMAL LOW (ref >60)
GFR calc non Af Amer: 8 mL/min — ABNORMAL LOW (ref >60)
Glucose, Bld: 152 mg/dL — ABNORMAL HIGH (ref 70–99)
Potassium: 3.7 mmol/L (ref 3.5–5.1)
Sodium: 139 mmol/L (ref 135–145)
Total Bilirubin: 0.8 mg/dL (ref 0.3–1.2)
Total Protein: 5.7 g/dL — ABNORMAL LOW (ref 6.5–8.1)

## 2020-05-08 LAB — CBC WITH DIFFERENTIAL/PLATELET
Abs Immature Granulocytes: 0.06 10*3/uL (ref 0.00–0.07)
Basophils Absolute: 0 10*3/uL (ref 0.0–0.1)
Basophils Relative: 0 %
Eosinophils Absolute: 0.2 10*3/uL (ref 0.0–0.5)
Eosinophils Relative: 2 %
HCT: 30.4 % — ABNORMAL LOW (ref 36.0–46.0)
Hemoglobin: 8.7 g/dL — ABNORMAL LOW (ref 12.0–15.0)
Immature Granulocytes: 1 %
Lymphocytes Relative: 6 %
Lymphs Abs: 0.7 10*3/uL (ref 0.7–4.0)
MCH: 21 pg — ABNORMAL LOW (ref 26.0–34.0)
MCHC: 28.6 g/dL — ABNORMAL LOW (ref 30.0–36.0)
MCV: 73.4 fL — ABNORMAL LOW (ref 80.0–100.0)
Monocytes Absolute: 0.9 10*3/uL (ref 0.1–1.0)
Monocytes Relative: 7 %
Neutro Abs: 9.9 10*3/uL — ABNORMAL HIGH (ref 1.7–7.7)
Neutrophils Relative %: 84 %
Platelets: 403 10*3/uL — ABNORMAL HIGH (ref 150–400)
RBC: 4.14 MIL/uL (ref 3.87–5.11)
RDW: 19.2 % — ABNORMAL HIGH (ref 11.5–15.5)
WBC: 11.8 10*3/uL — ABNORMAL HIGH (ref 4.0–10.5)
nRBC: 0 % (ref 0.0–0.2)

## 2020-05-08 LAB — GLUCOSE, CAPILLARY
Glucose-Capillary: 122 mg/dL — ABNORMAL HIGH (ref 70–99)
Glucose-Capillary: 135 mg/dL — ABNORMAL HIGH (ref 70–99)
Glucose-Capillary: 137 mg/dL — ABNORMAL HIGH (ref 70–99)
Glucose-Capillary: 137 mg/dL — ABNORMAL HIGH (ref 70–99)
Glucose-Capillary: 145 mg/dL — ABNORMAL HIGH (ref 70–99)

## 2020-05-08 LAB — CULTURE, BLOOD (ROUTINE X 2): Culture: NO GROWTH

## 2020-05-08 MED ORDER — AMOXICILLIN-POT CLAVULANATE 500-125 MG PO TABS
1.0000 | ORAL_TABLET | Freq: Every day | ORAL | Status: DC
  Administered 2020-05-08 – 2020-05-10 (×3): 500 mg via ORAL
  Filled 2020-05-08 (×3): qty 1

## 2020-05-08 NOTE — Progress Notes (Signed)
Physical Therapy Treatment Patient Details Name: Jacqueline Reilly MRN: [id number] DOB: Mar 29, 1924 Today's Date: 05/08/2020    History of Present Illness Pt is a 86 yo female presenting with nausea and vomiting and slight lethargy. Upon workup, pt  found to have acute renal failure, sepsis due to diverticulitis, and possible aspiration pneumonia, E coli UTI, AKI on CKD stage III, anemia, hypokalemia, hospice involved for plan of home hospice.  PMH includes: cardiomyopathy, afib on amiodarone apixaban, dementia, anemia, and AAA.    PT Comments    Son present and assisting in pt's care including peri care when needed.  He is very involved and attentive to his mom.  He is her only child and he lost his dad in July 2 years ago.  Pt seems to be getting progressively weaker, max assist for all mobility today taking significant stimulation to get her aroused.  BPs are very soft and she is throwing some ventricular-looking beats every so often on the tele monitor.  She is quite sick and I am glad hospice is involved in her care.  She will need ambulance transport home at discharge and likely a hospital bed to assist with her care. PT will continue to follow acutely for safe mobility progression.   Follow Up Recommendations  Supervision for mobility/OOB (home with hospice)     Equipment Recommendations  Hospital bed    Recommendations for Other Services   NA     Precautions / Restrictions Precautions Precautions: Fall Precaution Comments: monitor BP, LSO brace when OOB, 4LO2, sig dementia at baseline Required Braces or Orthoses: Spinal Brace Spinal Brace: Lumbar corset;Applied in sitting position    Mobility  Bed Mobility Overal bed mobility: Needs Assistance Bed Mobility: Rolling;Supine to Sit;Sit to Supine Rolling: Max assist   Supine to sit: Max assist Sit to supine: Max assist   General bed mobility comments: Max assist for transitions, pt coming up into flexion and attempting to lift  legs back into bed  Transfers Overall transfer level: Needs assistance Equipment used: 2 person hand held assist Transfers: Sit to/from Stand;Stand Pivot Transfers Sit to Stand: +2 physical assistance;Max assist Stand pivot transfers: +2 physical assistance;Max assist       General transfer comment: Two person max assist to stand and pivot OOB to the recliner chair and then back to the bed. Pt taking weight in feet and taking pivotal steps.   Ambulation/Gait             General Gait Details: too weak and BP too low to attempt       Balance Overall balance assessment: Needs assistance Sitting-balance support: Feet supported;Bilateral upper extremity supported Sitting balance-Leahy Scale: Zero Sitting balance - Comments: max assist EOB to maintain upright sitting posture.  LSO donned in sitting.  Postural control: Posterior lean                                  Cognition Arousal/Alertness: Lethargic (became more alert with EOB mobility ) Behavior During Therapy: Flat affect Overall Cognitive Status: History of cognitive impairments - at baseline                                 General Comments: Pt with h/o dementia, lethargic      Exercises General Exercises - Upper Extremity Shoulder Flexion: AAROM;Both;10 reps Elbow Flexion: AAROM;Both;10 reps Elbow Extension: AAROM;Both;10  reps General Exercises - Lower Extremity Ankle Circles/Pumps: PROM;Both;10 reps Heel Slides: PROM;Both;10 reps    General Comments General comments (skin integrity, edema, etc.): son assisting physically in her care.  He is very attentive.  Pt started vomiting while we were attempting to clean her up in the chiar after a small BM.       Pertinent Vitals/Pain Pain Assessment: Faces Pain Score: 4  Faces Pain Scale: Hurts little more Pain Location: unableto localize Pain Descriptors / Indicators: Grimacing;Guarding Pain Intervention(s): Limited activity within  patient's tolerance;Monitored during session;Repositioned           PT Goals (current goals can now be found in the care plan section) Acute Rehab PT Goals Patient Stated Goal: son would like pt to get up, wake up Progress towards PT goals: Not progressing toward goals - comment (getting progressively weaker)    Frequency    Min 3X/week      PT Plan Current plan remains appropriate       AM-PAC PT "6 Clicks" Mobility   Outcome Measure  Help needed turning from your back to your side while in a flat bed without using bedrails?: None Help needed moving from lying on your back to sitting on the side of a flat bed without using bedrails?: None Help needed moving to and from a bed to a chair (including a wheelchair)?: None Help needed standing up from a chair using your arms (e.g., wheelchair or bedside chair)?: None Help needed to walk in hospital room?: None Help needed climbing 3-5 steps with a railing? : None 6 Click Score: 24    End of Session Equipment Utilized During Treatment: Oxygen Activity Tolerance: Patient limited by fatigue;Patient limited by pain Patient left: in bed;with call bell/phone within reach;with bed alarm set Nurse Communication: Mobility status;Other (comment) (needs nausea meds) PT Visit Diagnosis: Other abnormalities of gait and mobility (R26.89);Difficulty in walking, not elsewhere classified (R26.2);Muscle weakness (generalized) (M62.81)     Time: 4944-9675 PT Time Calculation (min) (ACUTE ONLY): 91 min  Charges:  $Therapeutic Exercise: 8-22 mins $Therapeutic Activity: 68-82 mins                     Verdene Lennert, PT, DPT  Acute Rehabilitation [phone] pager [phone]) [phone] office

## 2020-05-08 NOTE — Progress Notes (Signed)
PROGRESS NOTE  Jacqueline Reilly BMW:[id number] DOB: 1924/03/18 DOA: 05/03/2020 PCP: [name], MD  HPI/Recap of past 24 hours: HPI from Dr [name] is a 86 y.o. female with history of chronic mixed ischemic/nonischemic cardiomyopathy last EF was 25% with moderate right ventricular systolic dysfunction on milrinone, atrial fibrillation on amiodarone apixaban with dementia, anemia, abdominal aortic aneurysm was brought to the ER the patient had been having nausea vomiting poor oral intake for the last few days.  Patient also mildly lethargic.  Patient unable to provide any history due to dementia.  Most of the history was obtained from the ER physician who discussed with patient's healthcare power of attorney and also patient's son. In the ER patient had low normal blood pressure in the 27O systolic.  Lab work is significant for acute renal failure and leukocytosis.  Creatinine has worsened from 1.2 in December 2020 and is around 3 and WBC count is 24.7 with a hemoglobin of 8.5.  Chest x-ray shows possible infiltrates.  CT abdomen pelvis is showing possible acute diverticulitis involving the proximal transverse colon.  In addition there is concern for possible metastatic lesions in the liver.  L4 compression fracture also.  Patient was started on empiric antibiotics.  Initially patient was given fluid for 1L for the acute renal failure.  Patient admitted for further management.      Patient seen and examined at bedside.  Requesting mittens to be removed.  Unable to further evaluate ROS.  Patient with poor appetite, generalized weakness, frail.     Assessment/Plan: Active Problems:   Hypertension   AAA (abdominal aortic aneurysm) (HCC)   Atrial fibrillation (HCC)   Chronic anemia   Pulmonary hypertension (HCC)   Acute diverticulitis   AKI (acute kidney injury) ([facility])   Sepsis likely 2/2 acute diverticulitis Noted leukocytosis, tachypnea on admission Currently afebrile,  with resolving leukocytosis CT abdomen/pelvis showed acute diverticulitis of the proximal transverse colon, no abscess or perforation noted Status post IV fluids Switch Zosyn to PO augmentin Monitor closely  Possible aspiration pneumonia History of coughing and vomiting recently Chest x-ray with possible infiltrate within the right upper lobe and left lung base SLP on board Full liquid diet Switch to PO Augmentin  E.coli UTI Urine culture grew E. coli pansensitive Continue Augmentin  AKI on CKD stage IIIb/metabolic acidosis/anion gap Worsening Baseline creatinine 1.2-1.4, on admission 3.09-->4.50 Status post gentle hydration Hold home diuretics Daily BMP  Anemia of chronic kidney disease/iron deficiency anemia Baseline hemoglobin around 10, on admission 8.5 Anemia panel showed iron 10, sats 3 Gave 1 dose of Feraheme IV on 05/05/2020 P.o. iron supplementation No obvious signs of bleeding Daily CBC   Hypokalemia Replace as needed  History of chronic cardiomyopathy/chronic systolic HF Chronic hypoxic respiratory failure Last echo with EF of 25% Discussed with Dr. [name] HF, recommend continuing milrinone for now and holding off all diuretics, recommends discharging patient on milrinone with hospice Continue milrinone  History of paroxysmal A. Fib Heart rate controlled Hold home amiodarone for now Continue home Eliquis  Possible metastatic lesions in liver CT abdomen showed innumerable hepatic hypodense masses consistent with metastatic disease Discussed with family, no further aggressive work-up  L4 compression fracture CT abdomen/pelvis showed L4 compression fracture possibly acute or subacute Notified neurosurgery, nonoperative management, back brace ordered for patient to use upon standing, sitting up and ambulation Pain management  History of AAA 4.2 cm infrarenal abdominal aortic aneurysm increased in size To further evaluate CT with IV contrast  recommended,  patient with AKI, can be pursued as an outpatient  Dementia Delirium precautions  Goals of care discussion Patient with very poor prognosis, advanced age, multiple comorbidities Palliative care consulted Discussed with son, recommend home hospice- [facility] willing to d/c pt on milrinone infusion       Malnutrition Type:      Malnutrition Characteristics:      Nutrition Interventions:       Estimated body mass index is 20.18 kg/m as calculated from the following:   Height as of this encounter: 4\' 9"  (1.448 m).   Weight as of this encounter: 42.3 kg.     Code Status: DNR   Family Communication: Discussed with son on 05/07/2020   Disposition Plan: Status is: Inpatient  Remains inpatient appropriate because:Inpatient level of care appropriate due to severity of illness   Dispo: The patient is from: Home (has 24-hour care)               Anticipated d/c is to: Likely home hospice              Anticipated d/c date is: 1 day              Patient currently is not medically stable to d/c.    Consultants:  Palliative care  Procedures:  None  Antimicrobials:  Augmentin  DVT prophylaxis: Eliquis   Objective: Vitals:   05/08/20 1200 05/08/20 1226 05/08/20 1300 05/08/20 1400  BP:  (!) 81/34 (!) 86/45 (!) 95/44  Pulse:    71  Resp: (!) 27 (!) 24 (!) 24   Temp:  (!) 96.5 F (35.8 C) (!) 96.7 F (35.9 C) (!) 97 F (36.1 C)  TempSrc:  Axillary Axillary Axillary  SpO2:  97% 97% 96%  Weight:      Height:        Intake/Output Summary (Last 24 hours) at 05/08/2020 1506 Last data filed at 05/08/2020 0400 Gross per 24 hour  Intake 447.57 ml  Output --  Net 447.57 ml   Filed Weights   05/05/20 0402 05/07/20 0500 05/08/20 0701  Weight: 43.9 kg 42.5 kg 42.3 kg    Exam:  General: NAD, pleasantly confused  Cardiovascular: S1, S2 present  Respiratory: CTAB  Abdomen: Soft, nontender, nondistended, bowel sounds present  Musculoskeletal: No  bilateral pedal edema noted  Skin: Normal  Psychiatry:  Unable to assess    Data Reviewed: CBC: Recent Labs  Lab 05/03/20 1718 05/03/20 1718 05/04/20 0415 05/05/20 0701 05/06/20 0311 05/07/20 0415 05/08/20 0319  WBC 24.7*   < > 19.8* 14.5* 12.9* 11.2* 11.8*  NEUTROABS 22.7*  --   --  12.3* 10.9* 9.2* 9.9*  HGB 8.5*   < > 8.3* 9.0* 8.5* 8.3* 8.7*  HCT 29.2*   < > 28.5* 32.0* 29.5* 28.7* 30.4*  MCV 70.7*   < > 71.8* 71.6* 71.3* 72.8* 73.4*  PLT 478*   < > 386 483* 462* 418* 403*   < > = values in this interval not displayed.   Basic Metabolic Panel: Recent Labs  Lab 05/04/20 0415 05/05/20 0701 05/06/20 0311 05/07/20 0415 05/08/20 0319  NA 139 140 140 140 139  K 3.6 3.1* 4.0 3.9 3.7  CL 105 104 105 104 104  CO2 18* 19* 20* 25 21*  GLUCOSE 118* 120* 118* 128* 152*  BUN 93* 86* 81* 79* 84*  CREATININE 2.93* 2.97* 2.74* 3.17* 4.50*  CALCIUM 8.7* 9.1 9.1 9.2 9.2   GFR: Estimated Creatinine Clearance: 4.7 mL/min (  A) (by C-G formula based on SCr of 4.5 mg/dL (H)). Liver Function Tests: Recent Labs  Lab 05/03/20 1718 05/05/20 0701 05/06/20 0311 05/07/20 0415 05/08/20 0319  AST 40 33 30 31 34  ALT 22 20 17 17 18   ALKPHOS 155* 141* 127* 120 133*  BILITOT 0.7 1.0 1.0 0.4 0.8  PROT 6.0* 6.0* 6.1* 5.6* 5.7*  ALBUMIN 2.7* 2.5* 2.4* 2.2* 2.3*   No results for input(s): LIPASE, AMYLASE in the last 168 hours. No results for input(s): AMMONIA in the last 168 hours. Coagulation Profile: No results for input(s): INR, PROTIME in the last 168 hours. Cardiac Enzymes: No results for input(s): CKTOTAL, CKMB, CKMBINDEX, TROPONINI in the last 168 hours. BNP (last 3 results) No results for input(s): PROBNP in the last 8760 hours. HbA1C: No results for input(s): HGBA1C in the last 72 hours. CBG: Recent Labs  Lab 05/07/20 2011 05/08/20 0033 05/08/20 0456 05/08/20 0813 05/08/20 1223  GLUCAP 116* 137* 145* 122* 137*   Lipid Profile: No results for input(s): CHOL, HDL,  LDLCALC, TRIG, CHOLHDL, LDLDIRECT in the last 72 hours. Thyroid Function Tests: No results for input(s): TSH, T4TOTAL, FREET4, T3FREE, THYROIDAB in the last 72 hours. Anemia Panel: No results for input(s): VITAMINB12, FOLATE, FERRITIN, TIBC, IRON, RETICCTPCT in the last 72 hours. Urine analysis:    Component Value Date/Time   COLORURINE YELLOW 05/03/2020 2139   APPEARANCEUR TURBID (A) 05/03/2020 2139   LABSPEC 1.010 05/03/2020 2139   PHURINE 6.0 05/03/2020 2139   GLUCOSEU NEGATIVE 05/03/2020 2139   HGBUR MODERATE (A) 05/03/2020 2139   BILIRUBINUR NEGATIVE 05/03/2020 2139   KETONESUR NEGATIVE 05/03/2020 2139   PROTEINUR 30 (A) 05/03/2020 2139   UROBILINOGEN 0.2 09/01/2015 1254   NITRITE NEGATIVE 05/03/2020 2139   LEUKOCYTESUR LARGE (A) 05/03/2020 2139   Sepsis Labs: @LABRCNTIP (procalcitonin:4,lacticidven:4)  ) Recent Results (from the past 240 hour(s))  Gastrointestinal Panel by PCR , Stool     Status: None   Collection Time: 05/03/20  4:40 PM   Specimen: Stool  Result Value Ref Range Status   Campylobacter species NOT DETECTED NOT DETECTED Final   Plesimonas shigelloides NOT DETECTED NOT DETECTED Final   Salmonella species NOT DETECTED NOT DETECTED Final   Yersinia enterocolitica NOT DETECTED NOT DETECTED Final   Vibrio species NOT DETECTED NOT DETECTED Final   Vibrio cholerae NOT DETECTED NOT DETECTED Final   Enteroaggregative E coli (EAEC) NOT DETECTED NOT DETECTED Final   Enteropathogenic E coli (EPEC) NOT DETECTED NOT DETECTED Final   Enterotoxigenic E coli (ETEC) NOT DETECTED NOT DETECTED Final   Shiga like toxin producing E coli (STEC) NOT DETECTED NOT DETECTED Final   Shigella/Enteroinvasive E coli (EIEC) NOT DETECTED NOT DETECTED Final   Cryptosporidium NOT DETECTED NOT DETECTED Final   Cyclospora cayetanensis NOT DETECTED NOT DETECTED Final   Entamoeba histolytica NOT DETECTED NOT DETECTED Final   Giardia lamblia NOT DETECTED NOT DETECTED Final   Adenovirus  F40/41 NOT DETECTED NOT DETECTED Final   Astrovirus NOT DETECTED NOT DETECTED Final   Norovirus GI/GII NOT DETECTED NOT DETECTED Final   Rotavirus A NOT DETECTED NOT DETECTED Final   Sapovirus (I, II, IV, and V) NOT DETECTED NOT DETECTED Final    Comment: Performed at [facility], [address] [place], [place] [address]  Blood culture (routine x 2)     Status: None   Collection Time: 05/03/20  8:24 PM   Specimen: BLOOD  Result Value Ref Range Status   Specimen Description BLOOD RIGHT WRIST  Final   Special Requests   Final    BOTTLES DRAWN AEROBIC AND ANAEROBIC Blood Culture results may not be optimal due to an inadequate volume of blood received in culture bottles   Culture   Final    NO GROWTH 5 DAYS Performed at [facility], [place] [address], [place], [place] [address]    Report Status 05/08/2020 FINAL  Final  Urine culture     Status: Abnormal   Collection Time: 05/03/20 10:11 PM   Specimen: Urine, Random  Result Value Ref Range Status   Specimen Description URINE, RANDOM  Final   Special Requests   Final    NONE Performed at [facility], [place] [address] [place], [place] [address]    Culture >=100,000 COLONIES/mL ESCHERICHIA COLI (A)  Final   Report Status 05/05/2020 FINAL  Final   Organism ID, Bacteria ESCHERICHIA COLI (A)  Final      Susceptibility   Escherichia coli - MIC*    AMPICILLIN <=2 SENSITIVE Sensitive     CEFAZOLIN <=4 SENSITIVE Sensitive     CEFTRIAXONE <=0.25 SENSITIVE Sensitive     CIPROFLOXACIN <=0.25 SENSITIVE Sensitive     GENTAMICIN <=1 SENSITIVE Sensitive     IMIPENEM <=0.25 SENSITIVE Sensitive     NITROFURANTOIN <=16 SENSITIVE Sensitive     TRIMETH/SULFA <=20 SENSITIVE Sensitive     AMPICILLIN/SULBACTAM <=2 SENSITIVE Sensitive     PIP/TAZO <=4 SENSITIVE Sensitive     * >=100,000 COLONIES/mL ESCHERICHIA COLI  SARS Coronavirus 2 by RT PCR (hospital order, performed in [place] hospital lab) Nasopharyngeal Nasopharyngeal  Swab     Status: None   Collection Time: 05/03/20 10:38 PM   Specimen: Nasopharyngeal Swab  Result Value Ref Range Status   SARS Coronavirus 2 NEGATIVE NEGATIVE Final    Comment: (NOTE) SARS-CoV-2 target nucleic acids are NOT DETECTED.  The SARS-CoV-2 RNA is generally detectable in upper and lower respiratory specimens during the acute phase of infection. The lowest concentration of SARS-CoV-2 viral copies this assay can detect is 250 copies / mL. A negative result does not preclude SARS-CoV-2 infection and should not be used as the sole basis for treatment or other patient management decisions.  A negative result may occur with improper specimen collection / handling, submission of specimen other than nasopharyngeal swab, presence of viral mutation(s) within the areas targeted by this assay, and inadequate number of viral copies (<250 copies / mL). A negative result must be combined with clinical observations, patient history, and epidemiological information.  Fact Sheet for Patients:   StrictlyIdeas.no  Fact Sheet for Healthcare Providers: BankingDealers.co.za  This test is not yet approved or  cleared by the [place] FDA and has been authorized for detection and/or diagnosis of SARS-CoV-2 by FDA under an Emergency Use Authorization (EUA).  This EUA will remain in effect (meaning this test can be used) for the duration of the COVID-19 declaration under Section 564(b)(1) of the Act, 21 U.S.C. section 360bbb-3(b)(1), unless the authorization is terminated or revoked sooner.  Performed at [facility], [place] [address] [place], [place] [address]   MRSA PCR Screening     Status: None   Collection Time: 05/06/20 12:02 PM   Specimen: Nasopharyngeal  Result Value Ref Range Status   MRSA by PCR NEGATIVE NEGATIVE Final    Comment:        The GeneXpert MRSA Assay (FDA approved for NASAL specimens only), is one component of  a comprehensive MRSA colonization surveillance  program. It is not intended to diagnose MRSA infection nor to guide or monitor treatment for MRSA infections. Performed at [facility], [place] [address] [place], [place] [address]       Studies: No results found.  Scheduled Meds: . amoxicillin-clavulanate  1 tablet Oral Daily  . apixaban  2.5 mg Oral BID  . busPIRone  5 mg Oral BID  . Chlorhexidine Gluconate Cloth  6 each Topical Daily  . OLANZapine  2.5 mg Oral Q supper  . sodium chloride flush  10-40 mL Intracatheter Q12H    Continuous Infusions: . milrinone 0.125 mcg/kg/min (05/08/20 1404)     LOS: 5 days     Alma Friendly, MD [facility]  If 7PM-7AM, please contact night-coverage [email or web address] 05/08/2020, 3:06 PM

## 2020-05-08 NOTE — Progress Notes (Signed)
Manufacturing engineer [facility])  Ms. Wethington is approved for hospice services at home once discharged.  Pt currently has most DME in place, but per son, her O2 is not working.  [facility] will have [employer] [facility] work with family to get this fixed.    Son is not sure if she will need ambulance transport home or not.   Pt will need appropriate IV access for milrinone infusions.    [facility] discussed with Carolynn Sayers that pt will transition to hospice.    From hospice and infusion perspective, safest d/c plan will be Tuesday late afternoon so above can be arranged.  Thank you, Venia Carbon RN, BSN, [facility] Liaison

## 2020-05-08 NOTE — Telephone Encounter (Signed)
pts son came by the clinic to request a call from [facility] about pts creatinine and lesions on liver possibly caused by milrinone. Son also wanted to discuss hospice. Pt is followed by Dr.Bensimhon but he is on vacation. Pts son [name] specifically asked for Dr.McLean to look at pts chart and give any advice. [name] said pt does not tolerate being off milrinone. Per Dr[facility]McLean [facility] on milrinone. Dr.McLean will speak with internal medicine physician about plan of care.

## 2020-05-08 NOTE — Progress Notes (Signed)
Daily Progress Note   Patient Name: Jacqueline Reilly       Date: 05/08/2020 DOB: [birth year]  Age: 86 y.o. MRN#: [id number] Attending Physician: Alma Friendly, MD Primary Care Physician: [name], MD Admit Date: 05/03/2020  Reason for Consultation/Follow-up: Establishing goals of care  Chart reviewed and updates received from RN.   Subjective: Patient much more calm and cooperative today. No longer requiring mittens. She remains alert to self and son. Denies pain. No acute distress. She is asking for water. Tolerated well with no signs of coughing. Will follow commands. RN also at the bedside.   Son, Jacqueline Reilly now at the bedside. Updates provided. He verbalized he would like patient to discharge home and does not wish to discontinue milrinone with awareness patient's prognosis remains poor and patient is at high risk of central line removal. Son verbalized understanding. He states goals remain clear for no aggressive work-up or interventions with wishes to continue with current care focusing on patient's comfort. He reports he would not like to expedite patient's death by withdrawing her from milrinone.   He is aware there is pending approval for continued therapy with outpatient hospice support.   I also spoke with patient's nephew, Jacqueline Reilly via phone and provided updates on patient's poor prognosis and current illness. Freddie expressed concerns with patient receiving sedating medications. Education provided on the use of ativan on previous day in order to assist with calmness given patient was extremely agitated. He verbalized understanding, however patient is taking oral medications at this time and tolerating. Freddie verbalized understanding and also expressing wishes for patient to be continued on milrinone and updates from the heart failure team. Education provided that this team has been notified by attending.   Received request from son that he would not wish to continue discussions  and updates to [place]. Son reports he is patient's legal guardian, however Jacqueline Reilly was her POA regarding asset needs. He was originally her POA which was discontinued when he gained guardianship via courts in 2019. When I spoke to [place] he verbalized this was "most likely true if his memory served him correctly".   Per [facility] patient is approved for outpatient hospice with milrinone infusions.    Length of Stay: 5 days  Vital Signs: BP (!) 95/44 (BP Location: Right Arm)   Pulse 71   Temp (!) 96.9 F (36.1 C) (Axillary)   Resp (!) 22   Ht 4\' 9"  (1.448 m)   Wt 42.3 kg   SpO2 96%   BMI 20.18 kg/m  SpO2: SpO2: 96 % O2 Device: O2 Device: Room Air O2 Flow Rate: O2 Flow Rate (L/min): 3.5 L/min  Physical Exam: -awake, alert to self and son, frail ill-appearing -hypotensive -normal breathing pattern -calm, cooperative, follows some commands         Palliative Care Assessment & Plan    Code Status:  DNR  Goals of Care/Recommendations:  Continue current plan of care per medical team  Son, Jacqueline Reilly requesting home with outpatient hospice with continued milrinone infusions. Understands poor prognosis and multi-organ failure however does not wish to expedite death by abruptly discontinuing.   Per Anderson Malta, RN ([place]) patient has been approved for hospice services.   PMT will continue to support and follow.   Prognosis: Poor   Discharge Planning: Home with Hospice   Care plan was discussed with patient's son, Jacqueline Pickerel, RN, and Dr. Horris Latino.   Thank you for allowing the Palliative Medicine Team to assist in the care  of this patient.  Time Total: 55 min.   Visit consisted of counseling and education dealing with the complex and emotionally intense issues of symptom management and palliative care in the setting of serious and potentially life-threatening illness.Greater than 50%  of this time was spent counseling and coordinating care related to the above assessment and  plan.  Alda Lea, AGPCNP-BC  Palliative Medicine Team [phone]

## 2020-05-09 ENCOUNTER — Other Ambulatory Visit (HOSPITAL_COMMUNITY): Payer: Self-pay | Admitting: Internal Medicine

## 2020-05-09 LAB — GLUCOSE, CAPILLARY
Glucose-Capillary: 103 mg/dL — ABNORMAL HIGH (ref 70–99)
Glucose-Capillary: 112 mg/dL — ABNORMAL HIGH (ref 70–99)
Glucose-Capillary: 119 mg/dL — ABNORMAL HIGH (ref 70–99)
Glucose-Capillary: 121 mg/dL — ABNORMAL HIGH (ref 70–99)
Glucose-Capillary: 93 mg/dL (ref 70–99)
Glucose-Capillary: 98 mg/dL (ref 70–99)

## 2020-05-09 MED ORDER — AMOXICILLIN-POT CLAVULANATE 500-125 MG PO TABS: 1.0000 | ORAL_TABLET | Freq: Every day | ORAL | 0 refills | Status: AC

## 2020-05-09 NOTE — Discharge Summary (Signed)
Discharge Summary  Jacqueline Reilly HYW:[id number] DOB: January 18, 1924  PCP: [name], MD  Admit date: 05/03/2020 Discharge date: 05/09/2020  Time spent: 45 mins  Recommendations for Outpatient Follow-up:  1. Follow-up with cardiology 2. Follow-up with PCP 3. Hospice care  Discharge Diagnoses:  Active Hospital Problems   Diagnosis Date Noted  . Acute diverticulitis 05/03/2020  . AKI (acute kidney injury) ([place]) 05/03/2020  . Pulmonary hypertension ([place]) 08/02/2017  . Chronic anemia 09/01/2015  . Atrial fibrillation ([place]) 08/10/2014  . AAA (abdominal aortic aneurysm) ([place]) 10/07/2013  . Hypertension     Resolved Hospital Problems  No resolved problems to display.    Discharge Condition: Fair  Diet recommendation: As tolerated, clear to full liquids  Vitals:   05/09/20 1147 05/09/20 1500  BP:  (!) 90/50  Pulse:    Resp:  17  Temp: (!) 96.5 F (35.8 C) (!) 97.5 F (36.4 C)  SpO2:      History of present illness:  Jacqueline Reilly a 86 y.o.femalewithhistory of chronic mixed ischemic/nonischemic cardiomyopathy last EF was 25% with moderate right ventricular systolic dysfunction on milrinone, atrial fibrillation on amiodarone apixaban with dementia, anemia, abdominal aortic aneurysm was brought to the ER the patient had been having nausea vomiting poor oral intake for the last few days. Patient also mildly lethargic. Patient unable to provide any history due to dementia. Most of the history was obtained from the ER physician who discussed with patient's healthcare power of attorney and also patient's son. In the ER patient had low normal blood pressure in the 48N systolic. Lab work is significant for acute renal failure and leukocytosis. Creatinine has worsened from 1.2 in December 2020 and is around 3 and WBC count is 24.7 with a hemoglobin of 8.5. Chest x-ray shows possible infiltrates. CT abdomen pelvis is showing possible acute diverticulitis involving the proximal  transverse colon. In addition there is concern for possible metastatic lesions in the liver. L4 compression fracture also. Patient was started on empiric antibiotics. Initially patient was given fluid for 1L for the acute renal failure. Patient admitted for further management.     Today, patient seen and examined at bedside.  Denies any new complaints, although ROS limited by dementia.  Patient continues to decline on a daily basis, with poor appetite, generalized weakness and very frail and deconditioned.  Discussed with both son Jacqueline Reilly and nephew extensively about the need for evaluating quality of life in the patient.  Patient renal function is progressively getting worse with an EF of 25% and not a candidate for dialysis.  Both of them verbalized understanding, and requested to continue milrinone and antibiotics.  Patient discharged with home hospice.    Hospital Course:  Active Problems:   Hypertension   AAA (abdominal aortic aneurysm) (H[facility])   Atrial fibrillation (H[facility])   Chronic anemia   Pulmonary hypertension (H[facility])   Acute diverticulitis   AKI (acute kidney injury) ([place])   Sepsis likely 2/2 acute diverticulitis Noted leukocytosis, tachypnea on admission Currently afebrile, with resolving leukocytosis CT abdomen/pelvis showed acute diverticulitis of the proximal transverse colon, no abscess or perforation noted Status post IV fluids Switch Zosyn to PO augmentin Discharge home with hospice  Possible aspiration pneumonia History of coughing and vomiting recently Chest x-ray with possible infiltrate within the right upper lobe and left lung base SLP consulted Full liquid diet, advance as tolerated Switch to PO Augmentin  E.coli UTI Urine culture grew E. coli pansensitive Continue Augmentin  AKI on CKD stage IIIb/metabolic  acidosis/anion gap Worsening Baseline creatinine 1.2-1.4, on admission 3.09-->4.50 Status post gentle hydration Discontinue home  diuretics  Anemia of chronic kidney disease/iron deficiency anemia Baseline hemoglobin around 10, on admission 8.5 Anemia panel showed iron 10, sats 3 Gave 1 dose of Feraheme IV on 05/05/2020 No obvious signs of bleeding   History of chronic cardiomyopathy/chronic systolic HF Chronic hypoxic respiratory failure Last echo with EF of 25% Discussed with Dr. [name] HF, recommend continuing milrinone for now and holding off all diuretics, recommends discharging patient on milrinone with hospice Continue milrinone as requested by son and nephew  History of paroxysmal A. Fib Heart rate controlled Hold home amiodarone for now Continue home Eliquis  Possible metastatic lesions in liver CT abdomen showed innumerable hepatic hypodense masses consistent with metastatic disease Discussed with family, no further aggressive work-up  L4 compression fracture CT abdomen/pelvis showed L4 compression fracture possibly acute or subacute Notified neurosurgery, nonoperative management, back brace ordered for patient to use upon standing, sitting up and ambulation Pain management  History of AAA 4.2 cm infrarenal abdominal aortic aneurysm increased in size To further evaluate CT with IV contrast recommended, patient with AKI, can be pursued as an outpatient  Dementia Delirium precautions  Goals of care discussion Patient with very poor prognosis, advanced age, multiple comorbidities, progressive decline Palliative care consulted Home hospice- [facility]        Malnutrition Type:      Malnutrition Characteristics:      Nutrition Interventions:      Estimated body mass index is 20.56 kg/m as calculated from the following:   Height as of this encounter: 4\' 9"  (1.448 m).   Weight as of this encounter: 43.1 kg.    Procedures:  None   Consultations:  None  Discharge Exam: BP (!) 90/50 (BP Location: Right Arm)   Pulse 78   Temp (!) 97.5 F (36.4 C) (Axillary)    Resp 17   Ht 4\' 9"  (1.448 m)   Wt 43.1 kg   SpO2 96%   BMI 20.56 kg/m   General: NAD, chronically ill-appearing, frail, deconditioned Cardiovascular: S1, S2 present Respiratory: CTA B Abdomen: Soft, nontender, nondistended, bowel sounds present    Discharge Instructions You were cared for by a hospitalist during your hospital stay. If you have any questions about your discharge medications or the care you received while you were in the hospital after you are discharged, you can call the unit and asked to speak with the hospitalist on call if the hospitalist that took care of you is not available. Once you are discharged, your primary care physician will handle any further medical issues. Please note that NO REFILLS for any discharge medications will be authorized once you are discharged, as it is imperative that you return to your primary care physician (or establish a relationship with a primary care physician if you do not have one) for your aftercare needs so that they can reassess your need for medications and monitor your lab values.  Discharge Instructions    Diet - low sodium heart healthy   Complete by: As directed    Discharge wound care:   Complete by: As directed    As per wound care recs   Increase activity slowly   Complete by: As directed      Allergies as of 05/09/2020      Reactions   Tape Other (See Comments)   SKIN WILL TEAR EASILY!!!!   Codeine Nausea Only   Colchicine Diarrhea  Garlic Nausea Only      Medication List    STOP taking these medications   allopurinol 100 MG tablet Commonly known as: ZYLOPRIM   colchicine 0.6 MG tablet   Fish Oil 1200 MG Caps   lisinopril 2.5 MG tablet Commonly known as: ZESTRIL   metolazone 2.5 MG tablet Commonly known as: ZAROXOLYN   multivitamin tablet   Pacerone 200 MG tablet Generic drug: amiodarone   potassium chloride SA 20 MEQ tablet Commonly known as: KLOR-CON   torsemide 20 MG tablet Commonly known  as: DEMADEX     TAKE these medications   acetaminophen 500 MG tablet Commonly known as: TYLENOL Take 1,000 mg by mouth 3 (three) times daily as needed (for back pain).   amoxicillin-clavulanate 500-125 MG tablet Commonly known as: AUGMENTIN Take 1 tablet (500 mg total) by mouth daily for 10 days. Start taking on: May 10, 2020   apixaban 2.5 MG Tabs tablet Commonly known as: Eliquis Take 1 tablet (2.5 mg total) by mouth 2 (two) times daily.   busPIRone 5 MG tablet Commonly known as: BUSPAR Take 5 mg by mouth 2 (two) times daily.   Culturelle Caps Take 1 capsule by mouth daily.   milrinone 20 MG/100 ML Soln infusion Commonly known as: PRIMACOR Inject 0.125 mcg/kg/min into the vein continuous. Rate 1.6 ml/hr Weight= 43 kg   OLANZapine 2.5 MG tablet Commonly known as: ZYPREXA Take 2.5 mg by mouth See admin instructions. Take 2.5 mg by mouth with supper and an additional 2.5 mg daily as needed/as directed   One-A-Day Womens 50+ Advantage Tabs Take 1 tablet by mouth daily with breakfast.            Discharge Care Instructions  (From admission, onward)         Start     Ordered   05/09/20 0000  Discharge wound care:       Comments: As per wound care recs   05/09/20 1135         Allergies  Allergen Reactions  . Tape Other (See Comments)    SKIN WILL TEAR EASILY!!!!  . Codeine Nausea Only  . Colchicine Diarrhea  . Garlic Nausea Only      The results of significant diagnostics from this hospitalization (including imaging, microbiology, ancillary and laboratory) are listed below for reference.    Significant Diagnostic Studies: CT ABDOMEN PELVIS WO CONTRAST  Result Date: 05/03/2020 CLINICAL DATA:  86 year old female with nausea vomiting. EXAM: CT ABDOMEN AND PELVIS WITHOUT CONTRAST TECHNIQUE: Multidetector CT imaging of the abdomen and pelvis was performed following the standard protocol without IV contrast. COMPARISON:  CT of the abdomen pelvis dated  01/08/2016. FINDINGS: Evaluation of this exam is limited in the absence of intravenous contrast. Evaluation is also limited due to streak artifact caused by patient's arms. Lower chest: There are bibasilar interstitial coarsening and nodularity which may be chronic. Atypical pneumonia or aspiration is not excluded. Clinical correlation is recommended. There is cardiomegaly with advanced multi vessel coronary vascular calcification. No intra-abdominal free air or free fluid. Hepatobiliary: Innumerable hepatic hypodense masses with the largest confluent of mass measuring 7.0 x 4.6 cm most consistent with metastatic disease. No intrahepatic biliary ductal dilatation. There are multiple stones within the gallbladder. No pericholecystic fluid or evidence of acute cholecystitis by CT. Pancreas: Unremarkable. No pancreatic ductal dilatation or surrounding inflammatory changes. Spleen: Normal in size without focal abnormality. Adrenals/Urinary Tract: The adrenal glands are unremarkable. There is no hydronephrosis on either side. Linear calcific  density in the upper pole of the left kidney likely represents vascular calcification and less likely nonobstructing stone. The urinary bladder is grossly unremarkable. Stomach/Bowel: There is sigmoid diverticulosis and scattered colonic diverticula. There is inflammatory changes centered at a proximal transverse colon diverticula (64/6 and 40/3) consistent with acute diverticulitis. No evidence of abscess or perforation. There is no bowel obstruction. The appendix is not visualized with certainty. No inflammatory changes identified in the right lower quadrant. Vascular/Lymphatic: There is advanced aortoiliac atherosclerotic disease. There is a 4.2 cm fusiform infrarenal abdominal aortic aneurysm. Evaluation of the abdominal aorta is very limited on this noncontrast CT. The aortic aneurysm however appears larger compared to the prior CT. There is apparent area of discontinuity in the  atherosclerotic calcification of the abdominal aorta with tissue density external to the margin of the aortic wall (40/3). This likely corresponds to the penetrating ulcer seen on the prior CT. However, aortic leak is not excluded. CT with IV contrast is recommended for better evaluation. The IVC is unremarkable. No portal venous gas. No adenopathy. Reproductive: The uterus is grossly unremarkable. There is a 12 mm right ovarian cystic structure. Other: None Musculoskeletal: Osteopenia with degenerative changes of the spine. There is a total left hip arthroplasty. There is age indeterminate compression fracture of L4 with approximately 25% loss of vertebral body height, possibly acute or subacute. Clinical correlation is recommended. No retropulsed fragment. IMPRESSION: 1. Acute diverticulitis of the proximal transverse colon. No abscess or perforation. 2. Innumerable hepatic hypodense masses most consistent with metastatic disease. 3. Cholelithiasis. 4. Age indeterminate L4 compression fracture, possibly acute or subacute. Clinical correlation is recommended. 5. A 4.2 cm infrarenal abdominal aortic aneurysm, increased in size since the prior CT. Further evaluation with CT with IV contrast recommended. 6. Aortic Atherosclerosis (ICD10-I70.0). Electronically Signed   By: Anner Crete M.D.   On: 05/03/2020 20:05   DG Cervical Spine 2 or 3 views  Result Date: 04/26/2020 CLINICAL DATA:  Chronic neck pain. EXAM: CERVICAL SPINE - 2-3 VIEW COMPARISON:  None. FINDINGS: Mild grade 1 anterolisthesis of C4-5 is noted most likely due to hypertrophy of posterior facet joints. No acute fracture is noted. Mild degenerative disc disease is noted at C5-6. Severe degenerative disc disease is noted at C6-7. No prevertebral soft tissue swelling is noted. IMPRESSION: Multilevel degenerative disc disease. Mild grade 1 anterolisthesis of C4-5 is noted most likely due to hypertrophy of posterior facet joints. No acute abnormality  is noted. Electronically Signed   By: Marijo Conception M.D.   On: 04/26/2020 14:55   DG Thoracic Spine W/Swimmers  Result Date: 04/26/2020 CLINICAL DATA:  Chronic thoracic spine pain. EXAM: THORACIC SPINE - 3 VIEWS COMPARISON:  None. FINDINGS: Diffuse osteopenia is noted. No definite evidence of acute fracture or spondylolisthesis is noted. Mild compression deformity of midthoracic vertebral body is noted most consistent with old fracture. No significant degenerative changes are noted. IMPRESSION: Mild compression deformity of midthoracic vertebral body is noted most consistent with old fracture. No definite acute abnormality seen in the thoracic spine. Electronically Signed   By: Marijo Conception M.D.   On: 04/26/2020 14:53   DG Lumbar Spine 2-3 Views  Result Date: 04/26/2020 CLINICAL DATA:  Chronic low back pain. EXAM: LUMBAR SPINE - 2-3 VIEW COMPARISON:  None. FINDINGS: Probable old L4 fracture is noted. No definite acute fracture or spondylolisthesis is noted. Mild degenerative disc disease is noted at L5-S1. IMPRESSION: Probable old L4 fracture. Mild degenerative disc disease is noted at  L5-S1. No acute abnormality seen in the lumbar spine. Aortic Atherosclerosis (ICD10-I70.0). Electronically Signed   By: Marijo Conception M.D.   On: 04/26/2020 14:50   DG Chest Portable 1 View  Result Date: 05/03/2020 CLINICAL DATA:  Weakness. EXAM: PORTABLE CHEST 1 VIEW COMPARISON:  March 30, 2020 FINDINGS: There is stable left-sided venous catheter positioning. Mild diffuse chronic appearing increased lung markings are seen with mild areas of superimposed atelectasis and/or infiltrate noted within the right upper lobe and left lung base. There is no evidence of a pleural effusion or pneumothorax. The cardiac silhouette is markedly enlarged. There is moderate severity calcification of the thoracic aorta. Moderate to marked severity degenerative changes seen involving both shoulders and throughout the thoracic spine.  IMPRESSION: Chronic appearing increased lung markings with mild superimposed atelectasis and/or infiltrate within the right upper lobe and left lung base. Electronically Signed   By: Virgina Norfolk M.D.   On: 05/03/2020 17:27    Microbiology: Recent Results (from the past 240 hour(s))  Gastrointestinal Panel by PCR , Stool     Status: None   Collection Time: 05/03/20  4:40 PM   Specimen: Stool  Result Value Ref Range Status   Campylobacter species NOT DETECTED NOT DETECTED Final   Plesimonas shigelloides NOT DETECTED NOT DETECTED Final   Salmonella species NOT DETECTED NOT DETECTED Final   Yersinia enterocolitica NOT DETECTED NOT DETECTED Final   Vibrio species NOT DETECTED NOT DETECTED Final   Vibrio cholerae NOT DETECTED NOT DETECTED Final   Enteroaggregative E coli (EAEC) NOT DETECTED NOT DETECTED Final   Enteropathogenic E coli (EPEC) NOT DETECTED NOT DETECTED Final   Enterotoxigenic E coli (ETEC) NOT DETECTED NOT DETECTED Final   Shiga like toxin producing E coli (STEC) NOT DETECTED NOT DETECTED Final   Shigella/Enteroinvasive E coli (EIEC) NOT DETECTED NOT DETECTED Final   Cryptosporidium NOT DETECTED NOT DETECTED Final   Cyclospora cayetanensis NOT DETECTED NOT DETECTED Final   Entamoeba histolytica NOT DETECTED NOT DETECTED Final   Giardia lamblia NOT DETECTED NOT DETECTED Final   Adenovirus F40/41 NOT DETECTED NOT DETECTED Final   Astrovirus NOT DETECTED NOT DETECTED Final   Norovirus GI/GII NOT DETECTED NOT DETECTED Final   Rotavirus A NOT DETECTED NOT DETECTED Final   Sapovirus (I, II, IV, and V) NOT DETECTED NOT DETECTED Final    Comment: Performed at [facility], [address] [place], [place] [address]  Blood culture (routine x 2)     Status: None   Collection Time: 05/03/20  8:24 PM   Specimen: BLOOD  Result Value Ref Range Status   Specimen Description BLOOD RIGHT WRIST  Final   Special Requests   Final    BOTTLES DRAWN AEROBIC AND ANAEROBIC Blood  Culture results may not be optimal due to an inadequate volume of blood received in culture bottles   Culture   Final    NO GROWTH 5 DAYS Performed at [facility], [address] [place], [place] [address]    Report Status 05/08/2020 FINAL  Final  Urine culture     Status: Abnormal   Collection Time: 05/03/20 10:11 PM   Specimen: Urine, Random  Result Value Ref Range Status   Specimen Description URINE, RANDOM  Final   Special Requests   Final    NONE Performed at [facility], [place] [address] [place], [place] [address]    Culture >=100,000 COLONIES/mL ESCHERICHIA COLI (A)  Final   Report Status 05/05/2020 FINAL  Final   Organism ID, Bacteria ESCHERICHIA COLI (A)  Final      Susceptibility   Escherichia coli - MIC*    AMPICILLIN <=2 SENSITIVE Sensitive     CEFAZOLIN <=4 SENSITIVE Sensitive     CEFTRIAXONE <=0.25 SENSITIVE Sensitive     CIPROFLOXACIN <=0.25 SENSITIVE Sensitive     GENTAMICIN <=1 SENSITIVE Sensitive     IMIPENEM <=0.25 SENSITIVE Sensitive     NITROFURANTOIN <=16 SENSITIVE Sensitive     TRIMETH/SULFA <=20 SENSITIVE Sensitive     AMPICILLIN/SULBACTAM <=2 SENSITIVE Sensitive     PIP/TAZO <=4 SENSITIVE Sensitive     * >=100,000 COLONIES/mL ESCHERICHIA COLI  SARS Coronavirus 2 by RT PCR (hospital order, performed in [place] hospital lab) Nasopharyngeal Nasopharyngeal Swab     Status: None   Collection Time: 05/03/20 10:38 PM   Specimen: Nasopharyngeal Swab  Result Value Ref Range Status   SARS Coronavirus 2 NEGATIVE NEGATIVE Final    Comment: (NOTE) SARS-CoV-2 target nucleic acids are NOT DETECTED.  The SARS-CoV-2 RNA is generally detectable in upper and lower respiratory specimens during the acute phase of infection. The lowest concentration of SARS-CoV-2 viral copies this assay can detect is 250 copies / mL. A negative result does not preclude SARS-CoV-2 infection and should not be used as the sole basis for treatment or other patient  management decisions.  A negative result may occur with improper specimen collection / handling, submission of specimen other than nasopharyngeal swab, presence of viral mutation(s) within the areas targeted by this assay, and inadequate number of viral copies (<250 copies / mL). A negative result must be combined with clinical observations, patient history, and epidemiological information.  Fact Sheet for Patients:   StrictlyIdeas.no  Fact Sheet for Healthcare Providers: BankingDealers.co.za  This test is not yet approved or  cleared by the [place] FDA and has been authorized for detection and/or diagnosis of SARS-CoV-2 by FDA under an Emergency Use Authorization (EUA).  This EUA will remain in effect (meaning this test can be used) for the duration of the COVID-19 declaration under Section 564(b)(1) of the Act, 21 U.S.C. section 360bbb-3(b)(1), unless the authorization is terminated or revoked sooner.  Performed at [facility], [place] [address] [place], [place] [address]   MRSA PCR Screening     Status: None   Collection Time: 05/06/20 12:02 PM   Specimen: Nasopharyngeal  Result Value Ref Range Status   MRSA by PCR NEGATIVE NEGATIVE Final    Comment:        The GeneXpert MRSA Assay (FDA approved for NASAL specimens only), is one component of a comprehensive MRSA colonization surveillance program. It is not intended to diagnose MRSA infection nor to guide or monitor treatment for MRSA infections. Performed at [facility], [place] [address] [place], [place] [address]      Labs: Basic Metabolic Panel: Recent Labs  Lab 05/04/20 0415 05/05/20 0701 05/06/20 0311 05/07/20 0415 05/08/20 0319  NA 139 140 140 140 139  K 3.6 3.1* 4.0 3.9 3.7  CL 105 104 105 104 104  CO2 18* 19* 20* 25 21*  GLUCOSE 118* 120* 118* 128* 152*  BUN 93* 86* 81* 79* 84*  CREATININE 2.93* 2.97* 2.74* 3.17* 4.50*  CALCIUM 8.7* 9.1  9.1 9.2 9.2   Liver Function Tests: Recent Labs  Lab 05/03/20 1718 05/05/20 0701 05/06/20 0311 05/07/20 0415 05/08/20 0319  AST 40 33 30 31 34  ALT 22 20 17 17 18   ALKPHOS 155*  141* 127* 120 133*  BILITOT 0.7 1.0 1.0 0.4 0.8  PROT 6.0* 6.0* 6.1* 5.6* 5.7*  ALBUMIN 2.7* 2.5* 2.4* 2.2* 2.3*   No results for input(s): LIPASE, AMYLASE in the last 168 hours. No results for input(s): AMMONIA in the last 168 hours. CBC: Recent Labs  Lab 05/03/20 1718 05/03/20 1718 05/04/20 0415 05/05/20 0701 05/06/20 0311 05/07/20 0415 05/08/20 0319  WBC 24.7*   < > 19.8* 14.5* 12.9* 11.2* 11.8*  NEUTROABS 22.7*  --   --  12.3* 10.9* 9.2* 9.9*  HGB 8.5*   < > 8.3* 9.0* 8.5* 8.3* 8.7*  HCT 29.2*   < > 28.5* 32.0* 29.5* 28.7* 30.4*  MCV 70.7*   < > 71.8* 71.6* 71.3* 72.8* 73.4*  PLT 478*   < > 386 483* 462* 418* 403*   < > = values in this interval not displayed.   Cardiac Enzymes: No results for input(s): CKTOTAL, CKMB, CKMBINDEX, TROPONINI in the last 168 hours. BNP: BNP (last 3 results) No results for input(s): BNP in the last 8760 hours.  ProBNP (last 3 results) No results for input(s): PROBNP in the last 8760 hours.  CBG: Recent Labs  Lab 05/08/20 2116 05/09/20 0020 05/09/20 0339 05/09/20 0738 05/09/20 1145  GLUCAP 135* 121* 119* 112* 98       Signed:  Alma Friendly, MD [facility] 05/09/2020, 4:08 PM

## 2020-05-09 NOTE — Progress Notes (Signed)
Palliative Medicine RN Note: Our team rec'd a call from pt's nephew Jacqueline Reilly, who requested a call back with discharge information so he can set up caregivers.  I spoke with [facility], who has been in touch with her son Jacqueline Reilly already this am and is coordinating d/c with him. I also spoke with PMT NP Jacqueline Reilly; Jacqueline Reilly told her that he no longer wants [place] speaking to [place]. I updated the demographics.  Jacqueline Skiff Bethel Sirois, RN, BSN, [facility] Palliative Medicine Team 05/09/2020 1:42 PM Office [phone]

## 2020-05-09 NOTE — TOC Initial Note (Addendum)
Transition of Care [facility]) - Initial/Assessment Note    Patient Details  Name: Jacqueline Reilly MRN: [id number] Date of Birth: Nov 11, 1923  Transition of Care [facility]) CM/SW Contact:    Marilu Favre, RN Phone Number: 05/09/2020, 11:03 AM  Clinical Narrative:                  Received a call from [facility] asking for a order for [facility] with milrinone dose. NCM entered [place] with milrinone dose and chatted MD to check and sign.     Spoke to patient's son [name] via phone [phone]. Confirmed he does want to take his mother home at discharge today with hospice through [facility].     Patient has home oxygen already , however, per [name] it is not working.   Discussed PT recommendation for hospital bed at home. Currently patient is sleeping on box string and mattress on floor. [name] took box spring and mattress off the frame and placed directly on floor. It is easier to get the patient up and down. Discussed semi electric  hospital bed , however, [name] does not want hospital bed at this time, maybe in the future.   [name] is currently at home. He would like to be at the hospital when his mother is discharge, but he thought someone was coming to fix her oxygen but he has not received any phone calls.   Explained NCM will call [facility] ( they arrange DME) and ask someone to call him.    [name] does want his mother to discharge via [place]. NCM confirmed address.    NCM contacted Melissa with [facility] and discussed all of above. She will call [name].    1100 Once everything in place NCM will arrange PTAR . Confirmed with Liliane Channel at [facility] they can transport with Milrinone   Have messaged bedside nurse and MD to be sure there is a DNR form signed on chart.Per chart and nurse patient is on room air   1400 Per Melissa with [place] pump unable to get pump today. Discharge will be tomorrow. Melissa called [name]. Expected Discharge Plan: Home w Hospice Care Barriers to  Discharge: Continued Medical Work up   Patient Goals and CMS Choice Patient states their goals for this hospitalization and ongoing recovery are:: to go home w hospice CMS Medicare.gov Compare Post Acute Care list provided to:: Other (Comment Required) Choice offered to / list presented to : Adult Children  Expected Discharge Plan and Services Expected Discharge Plan: [place]   Discharge Planning Services: CM Consult Post Acute Care Choice: [place] arrangements for the past 2 months: [place]: RN [facility] Agency: Hospice and [place] Date [place]: 05/06/20 Time [place]: [place] Representative spoke with at [place]: [place] Arrangements/Services Living arrangements for the past 2 months: [place] with:: Relatives Patient language and need for interpreter reviewed:: Yes        Need for Family Participation in Patient Care: Yes (Comment) Care giver support system in place?: Yes (comment) Current home services: Home RN, Hospice ([facility] home hosp[facility] and [place] for Home RN) Criminal Activity/Legal Involvement Pertinent to Current Situation/Hospitalization: No - Comment as needed  Activities of Daily Living Home Assistive Devices/Equipment: None  ADL Screening (condition at time of admission) Patient's cognitive ability adequate to safely complete daily activities?: No Is the patient deaf or have difficulty hearing?: Yes Does the patient have difficulty seeing, even when wearing glasses/contacts?: No Does the patient have difficulty concentrating, remembering, or making decisions?: Yes Patient able to express need for assistance with ADLs?: Yes Does the patient have difficulty dressing or bathing?: Yes Independently performs ADLs?: No Does the patient have difficulty walking or climbing stairs?: Yes Weakness of Legs: Both Weakness of  Arms/Hands: Both  Permission Sought/Granted      Share Information with NAME: Freddie Nephew           Emotional Assessment Appearance:: Appears stated age     Orientation: : Fluctuating Orientation (Suspected and/or reported [place])   Psych Involvement: No (comment)  Admission diagnosis:  AKI (acute kidney injury) ([place]) [N17.9] Acute diverticulitis [K57.92] Sepsis, due to unspecified organism, unspecified whether acute organ dysfunction present [facility]) [A41.9] Patient Active Problem List   Diagnosis Date Noted  . Acute diverticulitis 05/03/2020  . AKI (acute kidney injury) ([place]) 05/03/2020  . Sepsis ([place])   . Pulmonary hypertension ([place]) 08/02/2017  . SIRS (systemic inflammatory response syndrome) ([place]) 05/27/2017  . Acute blood loss anemia 05/27/2017  . Anxiety 05/27/2017  . CKD (chronic kidney disease), stage IV ([place]) 05/27/2017  . Heart failure with reduced ejection fraction ([place]) 04/05/2017  . Receiving inotropic medication 04/05/2017  . Hypokalemia 04/05/2017  . Malnutrition of moderate degree 03/05/2017  . Left displaced femoral neck fracture ([place]) 03/04/2017  . Leukocytosis 03/04/2017  . Hypoxia   . Gram-negative bacteremia 09/01/2015  . Chronic anemia 09/01/2015  . Persistent atrial fibrillation ([place])   . Acute on chronic systolic CHF (congestive heart failure) ([place]) 06/21/2015  . Atrial fibrillation ([place]) 08/10/2014  . Physical deconditioning 05/26/2014  . Acute respiratory failure with hypoxia ([place]) 05/08/2014  . NSTEMI, initial episode of care ([place]) 05/07/2014  . Atrial fibrillation with rapid ventricular response ([place]) 05/07/2014  . NSTEMI (non-ST elevated myocardial infarction) ([place]) 05/07/2014  . Abnormal chest x-ray 04/24/2014  . CHF (congestive heart failure) ([place]) 04/23/2014  . Acute on chronic combined systolic and diastolic congestive heart failure ([place]) 04/23/2014  . AAA (abdominal aortic aneurysm) ([place]) 10/07/2013  . SOB (shortness of  breath) 09/30/2013  . Coronary artery disease   . Ischemic dilated cardiomyopathy ([place])   . Hypertension    PCP:  [name], MD Pharmacy:   [place], [address] [place] [address] Phone: [phone] Fax: [phone]  [facility], [address] [place] [address] Phone: [phone] Fax: [phone]     Social Determinants of Health (SDOH) Interventions    Readmission Risk Interventions No flowsheet data found.

## 2020-05-09 NOTE — Progress Notes (Addendum)
Hydrologist [facility]) Hospital Liaison: RN note   Spoke withson [name] who is POAto discuss plan of care. Hospice has approved Milrinone at the home and will be setting up a CADD pump prior to discharge. ACC will follow up at home at discharge. Have spoken to [facility] MD, hospital MD, TOC, and [facility] referral center on updates following discharge. Pam from [facility] will come out to set up hopefully before 4pm if she is not able to set up today this will have to be done tomorrow morning for a midday or afternoon discharge. If not one of the liaison RN's will come. Son knows this will be a later discharge as it takes time to set up and time for the DME to be delivered. I also have ordered hospital bed for this patient.   Per discussion, plan is for discharge to home by [facility])  Please send signed and completed DNR form home with patient/family. Patient will need prescriptions for discharge comfort medications.   [facility] Referral Center aware of the above. Please notify ACC when patient is ready to leave the unit at discharge. (Call [phone] or [phone] after 5pm.) ACC information and contact numbers given toLarry.   Please call with any hospice related questions.   Thank you for this referral.  Clementeen Hoof, RN, [facility] (listed on [place] under Hospice and [place] [facility] [place])  [phone]

## 2020-05-10 LAB — GLUCOSE, CAPILLARY
Glucose-Capillary: 92 mg/dL (ref 70–99)
Glucose-Capillary: 86 mg/dL (ref 70–99)
Glucose-Capillary: 122 mg/dL — ABNORMAL HIGH (ref 70–99)
Glucose-Capillary: 119 mg/dL — ABNORMAL HIGH (ref 70–99)

## 2020-05-10 NOTE — Progress Notes (Signed)
Occupational Therapy Treatment Patient Details Name: Jacqueline Reilly MRN: [id number] DOB: 1923/11/08 Today's Date: 05/10/2020    History of present illness Pt is a 86 yo female presenting with nausea and vomiting and slight lethargy. Upon workup, pt  found to have acute renal failure, sepsis due to diverticulitis, and possible aspiration pneumonia, E coli UTI, AKI on CKD stage III, anemia, hypokalemia, hospice involved for plan of home hospice.  PMH includes: cardiomyopathy, afib on amiodarone apixaban, dementia, anemia, and AAA.   OT comments  Patient very lethargic on arrival and not consistently responding to simple questions.  She required mod assist to get to EOB from supine.  Patient needing max verbal and tactile cueing for sequencing and initiation.  Patient more awake once sitting.  Sat EOB~10 min and interacting with son.  Required total assist for LB dressing.  Stood two times for 2 min and practiced marching to build activity tolerance and strength.  Patient is making slow but steady progress with therapy.  Son very happy about her progress this session.  Will continue to follow with OT acutely to address the deficits listed below.    Follow Up Recommendations  Home health OT;Supervision/Assistance - 24 hour    Equipment Recommendations  None recommended by OT    Recommendations for Other Services      Precautions / Restrictions Precautions Precautions: Fall Required Braces or Orthoses: Spinal Brace Spinal Brace: Lumbar corset;Applied in sitting position       Mobility Bed Mobility Overal bed mobility: Needs Assistance Bed Mobility: Supine to Sit;Sit to Supine     Supine to sit: Mod assist;HOB elevated Sit to supine: Max assist;HOB elevated   General bed mobility comments: Requires max verbal cueing. Able to move legs toward edge when getting to sit  Transfers Overall transfer level: Needs assistance Equipment used: 2 person hand held assist Transfers: Sit to/from  Stand Sit to Stand: Min assist;+2 safety/equipment              Balance Overall balance assessment: Needs assistance Sitting-balance support: Single extremity supported;Feet supported Sitting balance-Leahy Scale: Fair Sitting balance - Comments: Sat at EOB with supervision ~10 min, had one instant of posterior LOB requiring therapist to recover Postural control: Posterior lean Standing balance support: Bilateral upper extremity supported Standing balance-Leahy Scale: Poor                             ADL either performed or assessed with clinical judgement   ADL Overall ADL's : Needs assistance/impaired Eating/Feeding: Set up;Sitting;Supervision/ safety Eating/Feeding Details (indicate cue type and reason): With drinking from a cup with straw                 Lower Body Dressing: Total assistance;Sitting/lateral leans               Functional mobility during ADLs: Minimal assistance;Cueing for safety General ADL Comments: Patient's son in room for session and he is very involved, likes to be helpful     Vision       Perception     Praxis      Cognition Arousal/Alertness: Lethargic;Awake/alert (progress to be more awake) Behavior During Therapy: Flat affect Overall Cognitive Status: History of cognitive impairments - at baseline                                 General Comments: hx of  dementia.  Trouble recognizing son initially.  Able to follow simple commands, but has difficulty answering basic questions        Exercises Exercises: Other exercises Other Exercises Other Exercises: 2x two minute stand and march   Shoulder Instructions       General Comments      Pertinent Vitals/ Pain       Pain Assessment: Faces Faces Pain Scale: Hurts little more Pain Location: "painful" unable to tell where Pain Descriptors / Indicators: Grimacing Pain Intervention(s): Limited activity within patient's tolerance;Monitored during  session;Repositioned  Home Living                                          Prior Functioning/Environment              Frequency  Min 2X/week        Progress Toward Goals  OT Goals(current goals can now be found in the care plan section)  Progress towards OT goals: Progressing toward goals  Acute Rehab OT Goals Patient Stated Goal: son would like pt to get up, wake up OT Goal Formulation: With patient/family Time For Goal Achievement: 05/19/20 Potential to Achieve Goals: Good  Plan Discharge plan remains appropriate    Co-evaluation                 AM-PAC OT "6 Clicks" Daily Activity     Outcome Measure   Help from another person eating meals?: A Lot Help from another person taking care of personal grooming?: A Little Help from another person toileting, which includes using toliet, bedpan, or urinal?: A Lot Help from another person bathing (including washing, rinsing, drying)?: A Lot Help from another person to put on and taking off regular upper body clothing?: A Lot Help from another person to put on and taking off regular lower body clothing?: Total 6 Click Score: 12    End of Session Equipment Utilized During Treatment: Gait belt  OT Visit Diagnosis: Unsteadiness on feet (R26.81);Other abnormalities of gait and mobility (R26.89);Muscle weakness (generalized) (M62.81);Adult, failure to thrive (R62.7)   Activity Tolerance Patient tolerated treatment well   Patient Left in bed;with call bell/phone within reach;with bed alarm set;with family/visitor present   Nurse Communication Mobility status        Time: 2703-5009 OT Time Calculation (min): 35 min  Charges: OT General Charges $OT Visit: 1 Visit OT Treatments $Self Care/Home Management : 8-22 mins $Therapeutic Activity: 8-22 mins  Jacqueline Reilly, OTR/L    Jacqueline Reilly 05/10/2020, 11:47 AM

## 2020-05-10 NOTE — Progress Notes (Signed)
Hydrologist [facility]) Hospital Liaison: RN note   Spoke withson [name] who is POAto discuss plan of care. Hospice has approved Milrinone at the home and will be setting up a CADD pump prior to discharge. Pam the [facility] nurse will set everything up and the plan is to discharge shortly after. ACC will follow up at home at discharge.   DME has been delivered and set up.  Per discussion, plan is for discharge to home by (PTAR)around 230pm.  Please send signed and completed DNR form home with patient/family. Patient will need prescriptions for discharge comfort medications.   [facility] Referral Center aware of the above. Please notify ACC when patient is ready to leave the unit at discharge. (Call [phone] or [phone] after 5pm.) ACC information and contact numbers given toLarry.   Please call with any hospice related questions.   Thank you for this referral.  Clementeen Hoof, RN, [facility] (listed on [place] under Hospice and [place] [facility] [place])  [phone]

## 2020-05-10 NOTE — Discharge Summary (Signed)
Discharge Summary  Jacqueline Reilly YIR:[id number] DOB: 21-Jul-1924  PCP: [name], MD  Admit date: 05/03/2020 Discharge date: 05/10/2020    No significant updates on the discharge summary done yesterday by Dr. Horris Latino, patient was seen and examined today, no change in medication list or plan of discharge    Recommendations for Outpatient Follow-up:  1. Follow-up with cardiology 2. Follow-up with PCP 3. Hospice care  Discharge Diagnoses:  Active Hospital Problems   Diagnosis Date Noted  . Acute diverticulitis 05/03/2020  . AKI (acute kidney injury) ([place]) 05/03/2020  . Pulmonary hypertension ([place]) 08/02/2017  . Chronic anemia 09/01/2015  . Atrial fibrillation ([place]) 08/10/2014  . AAA (abdominal aortic aneurysm) ([place]) 10/07/2013  . Hypertension     Resolved Hospital Problems  No resolved problems to display.    Discharge Condition: Fair  Diet recommendation: As tolerated, clear to full liquids  Vitals:   05/10/20 0700 05/10/20 0715  BP:    Pulse:    Resp:    Temp: 97.6 F (36.4 C)   SpO2:  97%    History of present illness:  Jacqueline Reilly a 86 y.o.femalewithhistory of chronic mixed ischemic/nonischemic cardiomyopathy last EF was 25% with moderate right ventricular systolic dysfunction on milrinone, atrial fibrillation on amiodarone apixaban with dementia, anemia, abdominal aortic aneurysm was brought to the ER the patient had been having nausea vomiting poor oral intake for the last few days. Patient also mildly lethargic. Patient unable to provide any history due to dementia. Most of the history was obtained from the ER physician who discussed with patient's healthcare power of attorney and also patient's son. In the ER patient had low normal blood pressure in the 50K systolic. Lab work is significant for acute renal failure and leukocytosis. Creatinine has worsened from 1.2 in December 2020 and is around 3 and WBC count is 24.7 with a hemoglobin of 8.5.  Chest x-ray shows possible infiltrates. CT abdomen pelvis is showing possible acute diverticulitis involving the proximal transverse colon. In addition there is concern for possible metastatic lesions in the liver. L4 compression fracture also. Patient was started on empiric antibiotics. Initially patient was given fluid for 1L for the acute renal failure. Patient admitted for further management.     Today, patient seen and examined at bedside.  Denies any new complaints, although ROS limited by dementia.  Patient continues to decline on a daily basis, with poor appetite, generalized weakness and very frail and deconditioned.  Discussed with both son [name] and nephew extensively about the need for evaluating quality of life in the patient.  Patient renal function is progressively getting worse with an EF of 25% and not a candidate for dialysis.  Both of them verbalized understanding, and requested to continue milrinone and antibiotics.  Patient discharged with home hospice.    Hospital Course:  Active Problems:   Hypertension   AAA (abdominal aortic aneurysm) (HCC)   Atrial fibrillation (HCC)   Chronic anemia   Pulmonary hypertension (HCC)   Acute diverticulitis   AKI (acute kidney injury) ([place])   Sepsis likely 2/2 acute diverticulitis Noted leukocytosis, tachypnea on admission Currently afebrile, with resolving leukocytosis CT abdomen/pelvis showed acute diverticulitis of the proximal transverse colon, no abscess or perforation noted Status post IV fluids Switch Zosyn to PO augmentin Discharge home with hospice  Possible aspiration pneumonia History of coughing and vomiting recently Chest x-ray with possible infiltrate within the right upper lobe and left lung base SLP consulted Full liquid diet, advance as tolerated  Switch to PO Augmentin  E.coli UTI Urine culture grew E. coli pansensitive Continue Augmentin  AKI on CKD stage IIIb/metabolic acidosis/anion  gap Worsening Baseline creatinine 1.2-1.4, on admission 3.09-->4.50 Status post gentle hydration Discontinue home diuretics  Anemia of chronic kidney disease/iron deficiency anemia Baseline hemoglobin around 10, on admission 8.5 Anemia panel showed iron 10, sats 3 Gave 1 dose of Feraheme IV on 05/05/2020 No obvious signs of bleeding   History of chronic cardiomyopathy/chronic systolic HF Chronic hypoxic respiratory failure Last echo with EF of 25% Discussed with Dr. [name] HF, recommend continuing milrinone for now and holding off all diuretics, recommends discharging patient on milrinone with hospice Continue milrinone as requested by son and nephew  History of paroxysmal A. Fib Heart rate controlled Hold home amiodarone for now Continue home Eliquis  Possible metastatic lesions in liver CT abdomen showed innumerable hepatic hypodense masses consistent with metastatic disease Discussed with family, no further aggressive work-up  L4 compression fracture CT abdomen/pelvis showed L4 compression fracture possibly acute or subacute Notified neurosurgery, nonoperative management, back brace ordered for patient to use upon standing, sitting up and ambulation Pain management  History of AAA 4.2 cm infrarenal abdominal aortic aneurysm increased in size To further evaluate CT with IV contrast recommended, patient with AKI, can be pursued as an outpatient  Dementia Delirium precautions  Goals of care discussion Patient with very poor prognosis, advanced age, multiple comorbidities, progressive decline Palliative care consulted Home hospice- [facility]        Malnutrition Type:      Malnutrition Characteristics:      Nutrition Interventions:      Estimated body mass index is 22.07 kg/m as calculated from the following:   Height as of this encounter: 4\' 9"  (1.448 m).   Weight as of this encounter: 46.3 kg.    Procedures:  None    Consultations:  None  Discharge Exam: BP 92/65 (BP Location: Right Arm)   Pulse 79   Temp 97.6 F (36.4 C) (Axillary)   Resp 20   Ht 4\' 9"  (1.448 m)   Wt 46.3 kg   SpO2 97%   BMI 22.07 kg/m   General: NAD, chronically ill-appearing, frail, deconditioned Cardiovascular: S1, S2 present Respiratory: CTA B Abdomen: Soft, nontender, nondistended, bowel sounds present    Discharge Instructions You were cared for by a hospitalist during your hospital stay. If you have any questions about your discharge medications or the care you received while you were in the hospital after you are discharged, you can call the unit and asked to speak with the hospitalist on call if the hospitalist that took care of you is not available. Once you are discharged, your primary care physician will handle any further medical issues. Please note that NO REFILLS for any discharge medications will be authorized once you are discharged, as it is imperative that you return to your primary care physician (or establish a relationship with a primary care physician if you do not have one) for your aftercare needs so that they can reassess your need for medications and monitor your lab values.  Discharge Instructions    Diet - low sodium heart healthy   Complete by: As directed    Discharge wound care:   Complete by: As directed    As per wound care recs   Increase activity slowly   Complete by: As directed      Allergies as of 05/10/2020      Reactions   Tape Other (  See Comments)   SKIN WILL TEAR EASILY!!!!   Codeine Nausea Only   Colchicine Diarrhea   Garlic Nausea Only      Medication List    STOP taking these medications   allopurinol 100 MG tablet Commonly known as: ZYLOPRIM   colchicine 0.6 MG tablet   Fish Oil 1200 MG Caps   lisinopril 2.5 MG tablet Commonly known as: ZESTRIL   metolazone 2.5 MG tablet Commonly known as: ZAROXOLYN   multivitamin tablet   Pacerone 200 MG  tablet Generic drug: amiodarone   potassium chloride SA 20 MEQ tablet Commonly known as: KLOR-CON   torsemide 20 MG tablet Commonly known as: DEMADEX     TAKE these medications   acetaminophen 500 MG tablet Commonly known as: TYLENOL Take 1,000 mg by mouth 3 (three) times daily as needed (for back pain).   amoxicillin-clavulanate 500-125 MG tablet Commonly known as: AUGMENTIN Take 1 tablet (500 mg total) by mouth daily for 10 days.   apixaban 2.5 MG Tabs tablet Commonly known as: Eliquis Take 1 tablet (2.5 mg total) by mouth 2 (two) times daily.   busPIRone 5 MG tablet Commonly known as: BUSPAR Take 5 mg by mouth 2 (two) times daily.   Culturelle Caps Take 1 capsule by mouth daily.   milrinone 20 MG/100 ML Soln infusion Commonly known as: PRIMACOR Inject 0.125 mcg/kg/min into the vein continuous. Rate 1.6 ml/hr Weight= 43 kg   OLANZapine 2.5 MG tablet Commonly known as: ZYPREXA Take 2.5 mg by mouth See admin instructions. Take 2.5 mg by mouth with supper and an additional 2.5 mg daily as needed/as directed   One-A-Day Womens 50+ Advantage Tabs Take 1 tablet by mouth daily with breakfast.            Discharge Care Instructions  (From admission, onward)         Start     Ordered   05/09/20 0000  Discharge wound care:       Comments: As per wound care recs   05/09/20 1135         Allergies  Allergen Reactions  . Tape Other (See Comments)    SKIN WILL TEAR EASILY!!!!  . Codeine Nausea Only  . Colchicine Diarrhea  . Garlic Nausea Only      The results of significant diagnostics from this hospitalization (including imaging, microbiology, ancillary and laboratory) are listed below for reference.    Significant Diagnostic Studies: CT ABDOMEN PELVIS WO CONTRAST  Result Date: 05/03/2020 CLINICAL DATA:  86 year old female with nausea vomiting. EXAM: CT ABDOMEN AND PELVIS WITHOUT CONTRAST TECHNIQUE: Multidetector CT imaging of the abdomen and pelvis was  performed following the standard protocol without IV contrast. COMPARISON:  CT of the abdomen pelvis dated 01/08/2016. FINDINGS: Evaluation of this exam is limited in the absence of intravenous contrast. Evaluation is also limited due to streak artifact caused by patient's arms. Lower chest: There are bibasilar interstitial coarsening and nodularity which may be chronic. Atypical pneumonia or aspiration is not excluded. Clinical correlation is recommended. There is cardiomegaly with advanced multi vessel coronary vascular calcification. No intra-abdominal free air or free fluid. Hepatobiliary: Innumerable hepatic hypodense masses with the largest confluent of mass measuring 7.0 x 4.6 cm most consistent with metastatic disease. No intrahepatic biliary ductal dilatation. There are multiple stones within the gallbladder. No pericholecystic fluid or evidence of acute cholecystitis by CT. Pancreas: Unremarkable. No pancreatic ductal dilatation or surrounding inflammatory changes. Spleen: Normal in size without focal abnormality. Adrenals/Urinary Tract: The  adrenal glands are unremarkable. There is no hydronephrosis on either side. Linear calcific density in the upper pole of the left kidney likely represents vascular calcification and less likely nonobstructing stone. The urinary bladder is grossly unremarkable. Stomach/Bowel: There is sigmoid diverticulosis and scattered colonic diverticula. There is inflammatory changes centered at a proximal transverse colon diverticula (64/6 and 40/3) consistent with acute diverticulitis. No evidence of abscess or perforation. There is no bowel obstruction. The appendix is not visualized with certainty. No inflammatory changes identified in the right lower quadrant. Vascular/Lymphatic: There is advanced aortoiliac atherosclerotic disease. There is a 4.2 cm fusiform infrarenal abdominal aortic aneurysm. Evaluation of the abdominal aorta is very limited on this noncontrast CT. The  aortic aneurysm however appears larger compared to the prior CT. There is apparent area of discontinuity in the atherosclerotic calcification of the abdominal aorta with tissue density external to the margin of the aortic wall (40/3). This likely corresponds to the penetrating ulcer seen on the prior CT. However, aortic leak is not excluded. CT with IV contrast is recommended for better evaluation. The IVC is unremarkable. No portal venous gas. No adenopathy. Reproductive: The uterus is grossly unremarkable. There is a 12 mm right ovarian cystic structure. Other: None Musculoskeletal: Osteopenia with degenerative changes of the spine. There is a total left hip arthroplasty. There is age indeterminate compression fracture of L4 with approximately 25% loss of vertebral body height, possibly acute or subacute. Clinical correlation is recommended. No retropulsed fragment. IMPRESSION: 1. Acute diverticulitis of the proximal transverse colon. No abscess or perforation. 2. Innumerable hepatic hypodense masses most consistent with metastatic disease. 3. Cholelithiasis. 4. Age indeterminate L4 compression fracture, possibly acute or subacute. Clinical correlation is recommended. 5. A 4.2 cm infrarenal abdominal aortic aneurysm, increased in size since the prior CT. Further evaluation with CT with IV contrast recommended. 6. Aortic Atherosclerosis (ICD10-I70.0). Electronically Signed   By: Anner Crete M.D.   On: 05/03/2020 20:05   DG Cervical Spine 2 or 3 views  Result Date: 04/26/2020 CLINICAL DATA:  Chronic neck pain. EXAM: CERVICAL SPINE - 2-3 VIEW COMPARISON:  None. FINDINGS: Mild grade 1 anterolisthesis of C4-5 is noted most likely due to hypertrophy of posterior facet joints. No acute fracture is noted. Mild degenerative disc disease is noted at C5-6. Severe degenerative disc disease is noted at C6-7. No prevertebral soft tissue swelling is noted. IMPRESSION: Multilevel degenerative disc disease. Mild grade 1  anterolisthesis of C4-5 is noted most likely due to hypertrophy of posterior facet joints. No acute abnormality is noted. Electronically Signed   By: Marijo Conception M.D.   On: 04/26/2020 14:55   DG Thoracic Spine W/Swimmers  Result Date: 04/26/2020 CLINICAL DATA:  Chronic thoracic spine pain. EXAM: THORACIC SPINE - 3 VIEWS COMPARISON:  None. FINDINGS: Diffuse osteopenia is noted. No definite evidence of acute fracture or spondylolisthesis is noted. Mild compression deformity of midthoracic vertebral body is noted most consistent with old fracture. No significant degenerative changes are noted. IMPRESSION: Mild compression deformity of midthoracic vertebral body is noted most consistent with old fracture. No definite acute abnormality seen in the thoracic spine. Electronically Signed   By: Marijo Conception M.D.   On: 04/26/2020 14:53   DG Lumbar Spine 2-3 Views  Result Date: 04/26/2020 CLINICAL DATA:  Chronic low back pain. EXAM: LUMBAR SPINE - 2-3 VIEW COMPARISON:  None. FINDINGS: Probable old L4 fracture is noted. No definite acute fracture or spondylolisthesis is noted. Mild degenerative disc disease is noted at  L5-S1. IMPRESSION: Probable old L4 fracture. Mild degenerative disc disease is noted at L5-S1. No acute abnormality seen in the lumbar spine. Aortic Atherosclerosis (ICD10-I70.0). Electronically Signed   By: Marijo Conception M.D.   On: 04/26/2020 14:50   DG Chest Portable 1 View  Result Date: 05/03/2020 CLINICAL DATA:  Weakness. EXAM: PORTABLE CHEST 1 VIEW COMPARISON:  March 30, 2020 FINDINGS: There is stable left-sided venous catheter positioning. Mild diffuse chronic appearing increased lung markings are seen with mild areas of superimposed atelectasis and/or infiltrate noted within the right upper lobe and left lung base. There is no evidence of a pleural effusion or pneumothorax. The cardiac silhouette is markedly enlarged. There is moderate severity calcification of the thoracic aorta.  Moderate to marked severity degenerative changes seen involving both shoulders and throughout the thoracic spine. IMPRESSION: Chronic appearing increased lung markings with mild superimposed atelectasis and/or infiltrate within the right upper lobe and left lung base. Electronically Signed   By: Virgina Norfolk M.D.   On: 05/03/2020 17:27    Microbiology: Recent Results (from the past 240 hour(s))  Gastrointestinal Panel by PCR , Stool     Status: None   Collection Time: 05/03/20  4:40 PM   Specimen: Stool  Result Value Ref Range Status   Campylobacter species NOT DETECTED NOT DETECTED Final   Plesimonas shigelloides NOT DETECTED NOT DETECTED Final   Salmonella species NOT DETECTED NOT DETECTED Final   Yersinia enterocolitica NOT DETECTED NOT DETECTED Final   Vibrio species NOT DETECTED NOT DETECTED Final   Vibrio cholerae NOT DETECTED NOT DETECTED Final   Enteroaggregative E coli (EAEC) NOT DETECTED NOT DETECTED Final   Enteropathogenic E coli (EPEC) NOT DETECTED NOT DETECTED Final   Enterotoxigenic E coli (ETEC) NOT DETECTED NOT DETECTED Final   Shiga like toxin producing E coli (STEC) NOT DETECTED NOT DETECTED Final   Shigella/Enteroinvasive E coli (EIEC) NOT DETECTED NOT DETECTED Final   Cryptosporidium NOT DETECTED NOT DETECTED Final   Cyclospora cayetanensis NOT DETECTED NOT DETECTED Final   Entamoeba histolytica NOT DETECTED NOT DETECTED Final   Giardia lamblia NOT DETECTED NOT DETECTED Final   Adenovirus F40/41 NOT DETECTED NOT DETECTED Final   Astrovirus NOT DETECTED NOT DETECTED Final   Norovirus GI/GII NOT DETECTED NOT DETECTED Final   Rotavirus A NOT DETECTED NOT DETECTED Final   Sapovirus (I, II, IV, and V) NOT DETECTED NOT DETECTED Final    Comment: Performed at [facility], [address] [place], [place] [address]  Blood culture (routine x 2)     Status: None   Collection Time: 05/03/20  8:24 PM   Specimen: BLOOD  Result Value Ref Range Status    Specimen Description BLOOD RIGHT WRIST  Final   Special Requests   Final    BOTTLES DRAWN AEROBIC AND ANAEROBIC Blood Culture results may not be optimal due to an inadequate volume of blood received in culture bottles   Culture   Final    NO GROWTH 5 DAYS Performed at [facility], [address], [place], [place] [address]    Report Status 05/08/2020 FINAL  Final  Urine culture     Status: Abnormal   Collection Time: 05/03/20 10:11 PM   Specimen: Urine, Random  Result Value Ref Range Status   Specimen Description URINE, RANDOM  Final   Special Requests   Final    NONE Performed at [facility], [place] [address] [place], [place] [address]    Culture >=  100,000 COLONIES/mL ESCHERICHIA COLI (A)  Final   Report Status 05/05/2020 FINAL  Final   Organism ID, Bacteria ESCHERICHIA COLI (A)  Final      Susceptibility   Escherichia coli - MIC*    AMPICILLIN <=2 SENSITIVE Sensitive     CEFAZOLIN <=4 SENSITIVE Sensitive     CEFTRIAXONE <=0.25 SENSITIVE Sensitive     CIPROFLOXACIN <=0.25 SENSITIVE Sensitive     GENTAMICIN <=1 SENSITIVE Sensitive     IMIPENEM <=0.25 SENSITIVE Sensitive     NITROFURANTOIN <=16 SENSITIVE Sensitive     TRIMETH/SULFA <=20 SENSITIVE Sensitive     AMPICILLIN/SULBACTAM <=2 SENSITIVE Sensitive     PIP/TAZO <=4 SENSITIVE Sensitive     * >=100,000 COLONIES/mL ESCHERICHIA COLI  SARS Coronavirus 2 by RT PCR (hospital order, performed in [facility] hospital lab) Nasopharyngeal Nasopharyngeal Swab     Status: None   Collection Time: 05/03/20 10:38 PM   Specimen: Nasopharyngeal Swab  Result Value Ref Range Status   SARS Coronavirus 2 NEGATIVE NEGATIVE Final    Comment: (NOTE) SARS-CoV-2 target nucleic acids are NOT DETECTED.  The SARS-CoV-2 RNA is generally detectable in upper and lower respiratory specimens during the acute phase of infection. The lowest concentration of SARS-CoV-2 viral copies this assay can detect is 250 copies / mL. A negative result  does not preclude SARS-CoV-2 infection and should not be used as the sole basis for treatment or other patient management decisions.  A negative result may occur with improper specimen collection / handling, submission of specimen other than nasopharyngeal swab, presence of viral mutation(s) within the areas targeted by this assay, and inadequate number of viral copies (<250 copies / mL). A negative result must be combined with clinical observations, patient history, and epidemiological information.  Fact Sheet for Patients:   StrictlyIdeas.no  Fact Sheet for Healthcare Providers: BankingDealers.co.za  This test is not yet approved or  cleared by the [place] FDA and has been authorized for detection and/or diagnosis of SARS-CoV-2 by FDA under an Emergency Use Authorization (EUA).  This EUA will remain in effect (meaning this test can be used) for the duration of the COVID-19 declaration under Section 564(b)(1) of the Act, 21 U.S.C. section 360bbb-3(b)(1), unless the authorization is terminated or revoked sooner.  Performed at [facility], [place] [address], [place], [place] [address]   MRSA PCR Screening     Status: None   Collection Time: 05/06/20 12:02 PM   Specimen: Nasopharyngeal  Result Value Ref Range Status   MRSA by PCR NEGATIVE NEGATIVE Final    Comment:        The GeneXpert MRSA Assay (FDA approved for NASAL specimens only), is one component of a comprehensive MRSA colonization surveillance program. It is not intended to diagnose MRSA infection nor to guide or monitor treatment for MRSA infections. Performed at [facility], [place] [address] [place], [place] [address]      Labs: Basic Metabolic Panel: Recent Labs  Lab 05/04/20 0415 05/05/20 0701 05/06/20 0311 05/07/20 0415 05/08/20 0319  NA 139 140 140 140 139  K 3.6 3.1* 4.0 3.9 3.7  CL 105 104 105 104 104  CO2 18* 19* 20* 25 21*  GLUCOSE  118* 120* 118* 128* 152*  BUN 93* 86* 81* 79* 84*  CREATININE 2.93* 2.97* 2.74* 3.17* 4.50*  CALCIUM 8.7* 9.1 9.1 9.2 9.2   Liver Function Tests: Recent Labs  Lab 05/03/20 1718 05/05/20 0701 05/06/20 0311 05/07/20 0415 05/08/20 0319  AST 40  33 30 31 34  ALT 22 20 17 17 18   ALKPHOS 155* 141* 127* 120 133*  BILITOT 0.7 1.0 1.0 0.4 0.8  PROT 6.0* 6.0* 6.1* 5.6* 5.7*  ALBUMIN 2.7* 2.5* 2.4* 2.2* 2.3*   No results for input(s): LIPASE, AMYLASE in the last 168 hours. No results for input(s): AMMONIA in the last 168 hours. CBC: Recent Labs  Lab 05/03/20 1718 05/03/20 1718 05/04/20 0415 05/05/20 0701 05/06/20 0311 05/07/20 0415 05/08/20 0319  WBC 24.7*   < > 19.8* 14.5* 12.9* 11.2* 11.8*  NEUTROABS 22.7*  --   --  12.3* 10.9* 9.2* 9.9*  HGB 8.5*   < > 8.3* 9.0* 8.5* 8.3* 8.7*  HCT 29.2*   < > 28.5* 32.0* 29.5* 28.7* 30.4*  MCV 70.7*   < > 71.8* 71.6* 71.3* 72.8* 73.4*  PLT 478*   < > 386 483* 462* 418* 403*   < > = values in this interval not displayed.   Cardiac Enzymes: No results for input(s): CKTOTAL, CKMB, CKMBINDEX, TROPONINI in the last 168 hours. BNP: BNP (last 3 results) No results for input(s): BNP in the last 8760 hours.  ProBNP (last 3 results) No results for input(s): PROBNP in the last 8760 hours.  CBG: Recent Labs  Lab 05/09/20 1145 05/09/20 1939 05/09/20 2324 05/10/20 0325 05/10/20 0735  GLUCAP 98 93 103* 92 86       Signed:  Phillips Climes, MD [facility] 05/10/2020, 10:23 AM

## 2020-05-10 NOTE — TOC Progression Note (Signed)
Transition of Care [facility]) - Progression Note    Patient Details  Name: [name] MRN: [id number] Date of Birth: 1924-06-06  Transition of [facility] CM/SW Contact  Jacalyn Lefevre Edson Snowball, RN Phone Number: 05/10/2020, 9:01 AM  Clinical Narrative:     Spoke to son. Hospital bed was delivered and home oxygen working. He is aware Pam with [facility] will come to room at 1100 to place patient on home milrinone. Melissa with [facility] requesting PT[facility] for 1430.   [name] and bedside nurse aware and in agreement. PTAR called and confirmed they can take milrinone pump.     Expected Discharge Plan: Home w Hospice Care Barriers to Discharge: Continued Medical Work up  Expected Discharge Plan and Services Expected Discharge Plan: [place]   Discharge Planning Services: CM Consult Post Acute Care Choice: [place] arrangements for the past 2 months: Single Family Home Expected Discharge Date: 05/09/20                         HH Arranged: RN [place] Agency: Hospice and [place] Date [place]: 05/06/20 Time [place]: [place] Representative spoke with at [place]: [place] ([place]) Interventions    Readmission Risk Interventions No flowsheet data found.

## 2020-05-19 DIAGNOSIS — K769 Liver disease, unspecified: Secondary | ICD-10-CM | POA: Diagnosis not present

## 2020-05-19 DIAGNOSIS — N179 Acute kidney failure, unspecified: Secondary | ICD-10-CM | POA: Diagnosis not present

## 2020-05-19 DIAGNOSIS — D649 Anemia, unspecified: Secondary | ICD-10-CM | POA: Diagnosis not present

## 2020-05-19 DIAGNOSIS — I5020 Unspecified systolic (congestive) heart failure: Secondary | ICD-10-CM | POA: Diagnosis not present

## 2020-05-19 DIAGNOSIS — K5792 Diverticulitis of intestine, part unspecified, without perforation or abscess without bleeding: Secondary | ICD-10-CM | POA: Diagnosis not present

## 2020-05-19 DIAGNOSIS — N390 Urinary tract infection, site not specified: Secondary | ICD-10-CM | POA: Diagnosis not present

## 2020-05-19 DIAGNOSIS — Z515 Encounter for palliative care: Secondary | ICD-10-CM | POA: Diagnosis not present

## 2020-05-31 ENCOUNTER — Ambulatory Visit: Payer: Medicare Other | Admitting: Podiatry

## 2020-07-13 ENCOUNTER — Telehealth (HOSPITAL_COMMUNITY): Payer: Medicare Other | Admitting: Internal Medicine
